# Patient Record
Sex: Female | Born: 1955 | Race: White | Hispanic: No | Smoking: Former smoker
Health system: Southern US, Community
[De-identification: ages and names within clinical notes are randomized; demographics above are authoritative.]

## PROBLEM LIST (undated history)

## (undated) DIAGNOSIS — G709 Myoneural disorder, unspecified: Secondary | ICD-10-CM

## (undated) DIAGNOSIS — F319 Bipolar disorder, unspecified: Secondary | ICD-10-CM

## (undated) DIAGNOSIS — R51 Headache: Secondary | ICD-10-CM

## (undated) DIAGNOSIS — E222 Syndrome of inappropriate secretion of antidiuretic hormone: Secondary | ICD-10-CM

## (undated) DIAGNOSIS — I639 Cerebral infarction, unspecified: Secondary | ICD-10-CM

## (undated) DIAGNOSIS — F329 Major depressive disorder, single episode, unspecified: Secondary | ICD-10-CM

## (undated) DIAGNOSIS — M1990 Unspecified osteoarthritis, unspecified site: Secondary | ICD-10-CM

## (undated) DIAGNOSIS — N189 Chronic kidney disease, unspecified: Secondary | ICD-10-CM

## (undated) DIAGNOSIS — F419 Anxiety disorder, unspecified: Secondary | ICD-10-CM

## (undated) DIAGNOSIS — Z87440 Personal history of urinary (tract) infections: Secondary | ICD-10-CM

## (undated) DIAGNOSIS — F29 Unspecified psychosis not due to a substance or known physiological condition: Secondary | ICD-10-CM

## (undated) DIAGNOSIS — K922 Gastrointestinal hemorrhage, unspecified: Secondary | ICD-10-CM

## (undated) DIAGNOSIS — G8929 Other chronic pain: Secondary | ICD-10-CM

## (undated) DIAGNOSIS — F32A Depression, unspecified: Secondary | ICD-10-CM

## (undated) DIAGNOSIS — E0590 Thyrotoxicosis, unspecified without thyrotoxic crisis or storm: Secondary | ICD-10-CM

## (undated) DIAGNOSIS — M25559 Pain in unspecified hip: Secondary | ICD-10-CM

---

## 2002-07-31 ENCOUNTER — Emergency Department (HOSPITAL_COMMUNITY): Admission: EM | Admit: 2002-07-31 | Discharge: 2002-07-31 | Payer: Self-pay | Admitting: Emergency Medicine

## 2002-08-08 ENCOUNTER — Emergency Department (HOSPITAL_COMMUNITY): Admission: EM | Admit: 2002-08-08 | Discharge: 2002-08-08 | Payer: Self-pay | Admitting: Emergency Medicine

## 2004-08-27 ENCOUNTER — Emergency Department (HOSPITAL_COMMUNITY): Admission: EM | Admit: 2004-08-27 | Discharge: 2004-08-27 | Payer: Self-pay | Admitting: Emergency Medicine

## 2006-04-03 ENCOUNTER — Inpatient Hospital Stay: Payer: Self-pay | Admitting: Psychiatry

## 2009-02-28 ENCOUNTER — Emergency Department (HOSPITAL_COMMUNITY): Admission: EM | Admit: 2009-02-28 | Discharge: 2009-02-28 | Payer: Self-pay | Admitting: Emergency Medicine

## 2009-06-12 ENCOUNTER — Emergency Department (HOSPITAL_COMMUNITY): Admission: EM | Admit: 2009-06-12 | Discharge: 2009-06-12 | Payer: Self-pay | Admitting: Emergency Medicine

## 2009-06-30 ENCOUNTER — Ambulatory Visit: Payer: Self-pay | Admitting: Psychiatry

## 2009-06-30 ENCOUNTER — Emergency Department (HOSPITAL_COMMUNITY): Admission: EM | Admit: 2009-06-30 | Discharge: 2009-06-30 | Payer: Self-pay | Admitting: Emergency Medicine

## 2009-07-19 ENCOUNTER — Inpatient Hospital Stay (HOSPITAL_COMMUNITY): Admission: AD | Admit: 2009-07-19 | Discharge: 2009-07-23 | Payer: Self-pay | Admitting: Psychiatry

## 2009-07-19 ENCOUNTER — Emergency Department (HOSPITAL_COMMUNITY): Admission: EM | Admit: 2009-07-19 | Discharge: 2009-07-19 | Payer: Self-pay | Admitting: Emergency Medicine

## 2009-12-21 ENCOUNTER — Inpatient Hospital Stay (HOSPITAL_COMMUNITY): Admission: AD | Admit: 2009-12-21 | Discharge: 2009-07-05 | Payer: Self-pay | Admitting: Psychiatry

## 2010-04-01 LAB — RAPID URINE DRUG SCREEN, HOSP PERFORMED
Amphetamines: NOT DETECTED
Amphetamines: NOT DETECTED
Barbiturates: NOT DETECTED
Benzodiazepines: POSITIVE — AB
Opiates: NOT DETECTED
Tetrahydrocannabinol: NOT DETECTED

## 2010-04-01 LAB — URINE CULTURE

## 2010-04-01 LAB — COMPREHENSIVE METABOLIC PANEL
ALT: 11 U/L (ref 0–35)
Albumin: 3.4 g/dL — ABNORMAL LOW (ref 3.5–5.2)
Alkaline Phosphatase: 58 U/L (ref 39–117)
BUN: 4 mg/dL — ABNORMAL LOW (ref 6–23)
Chloride: 100 mEq/L (ref 96–112)
Glucose, Bld: 86 mg/dL (ref 70–99)
Potassium: 4.6 mEq/L (ref 3.5–5.1)
Sodium: 133 mEq/L — ABNORMAL LOW (ref 135–145)
Total Bilirubin: 0.6 mg/dL (ref 0.3–1.2)
Total Protein: 5.9 g/dL — ABNORMAL LOW (ref 6.0–8.3)

## 2010-04-01 LAB — CBC
HCT: 39.8 % (ref 36.0–46.0)
Hemoglobin: 13.6 g/dL (ref 12.0–15.0)
MCHC: 34.3 g/dL (ref 30.0–36.0)
MCV: 90.7 fL (ref 78.0–100.0)
Platelets: 281 10*3/uL (ref 150–400)
RBC: 4.37 MIL/uL (ref 3.87–5.11)
RBC: 4.3 MIL/uL (ref 3.87–5.11)
RDW: 13.9 % (ref 11.5–15.5)
WBC: 11.5 10*3/uL — ABNORMAL HIGH (ref 4.0–10.5)

## 2010-04-01 LAB — BASIC METABOLIC PANEL
BUN: 5 mg/dL — ABNORMAL LOW (ref 6–23)
BUN: 2 mg/dL — ABNORMAL LOW (ref 6–23)
CO2: 25 mEq/L (ref 19–32)
CO2: 29 mEq/L (ref 19–32)
Calcium: 10.5 mg/dL (ref 8.4–10.5)
Calcium: 9.7 mg/dL (ref 8.4–10.5)
Chloride: 94 mEq/L — ABNORMAL LOW (ref 96–112)
Chloride: 94 mEq/L — ABNORMAL LOW (ref 96–112)
Creatinine, Ser: 0.65 mg/dL (ref 0.4–1.2)
GFR calc Af Amer: >60 mL/min (ref >60)
GFR calc non Af Amer: >60 mL/min (ref >60)
Glucose, Bld: 96 mg/dL (ref 70–99)
Potassium: 4.6 mEq/L (ref 3.5–5.1)
Potassium: 4.5 mEq/L (ref 3.5–5.1)
Sodium: 129 mEq/L — ABNORMAL LOW (ref 135–145)

## 2010-04-01 LAB — DIFFERENTIAL
Basophils Absolute: 0.1 10*3/uL (ref 0.0–0.1)
Basophils Relative: 1 % (ref 0–1)
Basophils Relative: 1 % (ref 0–1)
Eosinophils Absolute: 0.1 10*3/uL (ref 0.0–0.7)
Lymphocytes Relative: 31 % (ref 12–46)
Monocytes Absolute: 0.7 10*3/uL (ref 0.1–1.0)
Monocytes Relative: 6 % (ref 3–12)
Monocytes Relative: 6 % (ref 3–12)
Neutro Abs: 7.1 10*3/uL (ref 1.7–7.7)
Neutrophils Relative %: 61 % (ref 43–77)
Neutrophils Relative %: 64 % (ref 43–77)

## 2010-04-01 LAB — URINALYSIS, ROUTINE W REFLEX MICROSCOPIC
Hgb urine dipstick: NEGATIVE
Nitrite: NEGATIVE
Nitrite: POSITIVE — AB
Protein, ur: NEGATIVE mg/dL
Protein, ur: NEGATIVE mg/dL
Specific Gravity, Urine: 1.025 (ref 1.005–1.030)
Specific Gravity, Urine: >1.03 — ABNORMAL HIGH (ref 1.005–1.030)
Urobilinogen, UA: 0.2 mg/dL (ref 0.0–1.0)
Urobilinogen, UA: 0.2 mg/dL (ref 0.0–1.0)

## 2010-04-01 LAB — GLUCOSE, CAPILLARY: Glucose-Capillary: 137 mg/dL — ABNORMAL HIGH (ref 70–99)

## 2010-04-01 LAB — URINE MICROSCOPIC-ADD ON

## 2010-04-01 LAB — ETHANOL: Alcohol, Ethyl (B): <5 mg/dL (ref 0–10)

## 2010-04-01 LAB — VALPROIC ACID LEVEL: Valproic Acid Lvl: 62.9 ug/mL (ref 50.0–100.0)

## 2010-04-02 LAB — DIFFERENTIAL
Basophils Absolute: 0.2 10*3/uL — ABNORMAL HIGH (ref 0.0–0.1)
Eosinophils Absolute: 0.1 10*3/uL (ref 0.0–0.7)
Eosinophils Relative: 1 % (ref 0–5)

## 2010-04-02 LAB — RAPID URINE DRUG SCREEN, HOSP PERFORMED
Cocaine: NOT DETECTED
Opiates: NOT DETECTED
Tetrahydrocannabinol: NOT DETECTED

## 2010-04-02 LAB — CBC
HCT: 40.3 % (ref 36.0–46.0)
MCV: 90.4 fL (ref 78.0–100.0)
Platelets: 313 10*3/uL (ref 150–400)
RDW: 14.3 % (ref 11.5–15.5)

## 2010-04-02 LAB — VALPROIC ACID LEVEL: Valproic Acid Lvl: <10 ug/mL — ABNORMAL LOW (ref 50.0–100.0)

## 2010-04-02 LAB — BASIC METABOLIC PANEL
BUN: 3 mg/dL — ABNORMAL LOW (ref 6–23)
Chloride: 102 mEq/L (ref 96–112)
Glucose, Bld: 119 mg/dL — ABNORMAL HIGH (ref 70–99)
Potassium: 3.4 mEq/L — ABNORMAL LOW (ref 3.5–5.1)

## 2010-04-05 LAB — URINALYSIS, ROUTINE W REFLEX MICROSCOPIC
Glucose, UA: NEGATIVE mg/dL
Protein, ur: NEGATIVE mg/dL
Specific Gravity, Urine: 1.005 (ref 1.005–1.030)
Urobilinogen, UA: 0.2 mg/dL (ref 0.0–1.0)

## 2010-04-05 LAB — CBC
HCT: 42.7 % (ref 36.0–46.0)
MCV: 92.3 fL (ref 78.0–100.0)
RBC: 4.62 MIL/uL (ref 3.87–5.11)
WBC: 11.2 10*3/uL — ABNORMAL HIGH (ref 4.0–10.5)

## 2010-04-05 LAB — RAPID URINE DRUG SCREEN, HOSP PERFORMED
Barbiturates: NOT DETECTED
Benzodiazepines: NOT DETECTED
Opiates: NOT DETECTED

## 2010-04-05 LAB — DIFFERENTIAL
Eosinophils Absolute: 0.1 10*3/uL (ref 0.0–0.7)
Eosinophils Relative: 1 % (ref 0–5)
Lymphs Abs: 3.5 10*3/uL (ref 0.7–4.0)
Monocytes Relative: 7 % (ref 3–12)

## 2010-11-12 ENCOUNTER — Emergency Department (HOSPITAL_COMMUNITY)
Admission: EM | Admit: 2010-11-12 | Discharge: 2010-11-13 | Disposition: A | Discharge status: Other Acute Inpatient Hospital | Payer: Self-pay | Attending: Emergency Medicine | Admitting: Emergency Medicine

## 2010-11-12 ENCOUNTER — Encounter: Payer: Self-pay | Admitting: *Deleted

## 2010-11-12 DIAGNOSIS — F22 Delusional disorders: Secondary | ICD-10-CM | POA: Insufficient documentation

## 2010-11-12 DIAGNOSIS — F411 Generalized anxiety disorder: Secondary | ICD-10-CM | POA: Insufficient documentation

## 2010-11-12 DIAGNOSIS — F419 Anxiety disorder, unspecified: Secondary | ICD-10-CM

## 2010-11-12 DIAGNOSIS — Z008 Encounter for other general examination: Secondary | ICD-10-CM | POA: Insufficient documentation

## 2010-11-12 HISTORY — DX: Bipolar disorder, unspecified: F31.9

## 2010-11-12 LAB — DIFFERENTIAL
Basophils Absolute: 0 10*3/uL (ref 0.0–0.1)
Basophils Relative: 0 % (ref 0–1)
Eosinophils Absolute: 0.1 10*3/uL (ref 0.0–0.7)
Eosinophils Relative: 2 % (ref 0–5)
Lymphocytes Relative: 49 % — ABNORMAL HIGH (ref 12–46)
Lymphs Abs: 2.4 10*3/uL (ref 0.7–4.0)
Monocytes Absolute: 0.6 10*3/uL (ref 0.1–1.0)
Monocytes Relative: 12 % (ref 3–12)
Neutro Abs: 1.8 10*3/uL (ref 1.7–7.7)
Neutrophils Relative %: 37 % — ABNORMAL LOW (ref 43–77)

## 2010-11-12 LAB — CBC
HCT: 36.4 % (ref 36.0–46.0)
Hemoglobin: 12.7 g/dL (ref 12.0–15.0)
MCH: 31.7 pg (ref 26.0–34.0)
MCHC: 34.9 g/dL (ref 30.0–36.0)
MCV: 90.8 fL (ref 78.0–100.0)
Platelets: 167 10*3/uL (ref 150–400)
RBC: 4.01 MIL/uL (ref 3.87–5.11)
RDW: 13.4 % (ref 11.5–15.5)
WBC: 4.9 10*3/uL (ref 4.0–10.5)

## 2010-11-12 LAB — URINALYSIS, ROUTINE W REFLEX MICROSCOPIC
Bilirubin Urine: NEGATIVE
Glucose, UA: NEGATIVE mg/dL
Ketones, ur: NEGATIVE mg/dL
Nitrite: NEGATIVE
Protein, ur: NEGATIVE mg/dL
Specific Gravity, Urine: 1.01 (ref 1.005–1.030)
Urobilinogen, UA: 0.2 mg/dL (ref 0.0–1.0)
pH: 7 (ref 5.0–8.0)

## 2010-11-12 LAB — URINE MICROSCOPIC-ADD ON

## 2010-11-12 LAB — BASIC METABOLIC PANEL
BUN: 9 mg/dL (ref 6–23)
CO2: 34 mEq/L — ABNORMAL HIGH (ref 19–32)
Calcium: 10 mg/dL (ref 8.4–10.5)
Chloride: 91 mEq/L — ABNORMAL LOW (ref 96–112)
Creatinine, Ser: 0.54 mg/dL (ref 0.50–1.10)
GFR calc Af Amer: >90 mL/min (ref >90)
GFR calc non Af Amer: >90 mL/min (ref >90)
Glucose, Bld: 92 mg/dL (ref 70–99)
Potassium: 4.4 mEq/L (ref 3.5–5.1)
Sodium: 129 mEq/L — ABNORMAL LOW (ref 135–145)

## 2010-11-12 LAB — RAPID URINE DRUG SCREEN, HOSP PERFORMED
Amphetamines: NOT DETECTED
Barbiturates: NOT DETECTED
Benzodiazepines: NOT DETECTED
Cocaine: NOT DETECTED
Opiates: NOT DETECTED
Tetrahydrocannabinol: NOT DETECTED

## 2010-11-12 LAB — VALPROIC ACID LEVEL: Valproic Acid Lvl: 88.6 ug/mL (ref 50.0–100.0)

## 2010-11-12 LAB — PREGNANCY, URINE: Preg Test, Ur: NEGATIVE

## 2010-11-12 NOTE — ED Notes (Signed)
Pt stated she is "ready to go, I want to go home". MD aware

## 2010-11-12 NOTE — ED Notes (Signed)
Pt brought over by Child psychotherapist for medical clearance. Per family patient has been listless and very agitated over the last couple weeks. Per evaluation patient is having racing thoughts and illogical thinking. Pt alert and oriented x 3.

## 2010-11-12 NOTE — ED Provider Notes (Signed)
Scribed for Raeford Razor, MD, the patient was seen in room APA16A/APA16A . This chart was scribed by Ellie Lunch.   CSN: [id number] Arrival date & time: 11/12/2010  1:26 PM   First MD Initiated Contact with Patient 11/12/10 1508      Chief Complaint  Patient presents with  . Psychiatric Evaluation    (Consider location/radiation/quality/duration/timing/severity/associated sxs/prior treatment) HPI Level 5 caveat for psychiatric disorder.  Cassidy Smith is a 55 y.o. female who presents to the Emergency Department for medical clearance. Per family pat has been listless and very agitated over the last couple of weeks. Pt was brought into by a Child psychotherapist for medical clearance.   Per PT  Pt says she was brought in by brother b/c she is anxious. Pt lives with brother. Pt reports thought of self harm. She says she feels like "cutting." Pt is unsure of any event that occurred today that lead her to be brought in. Pt denies any drinking or drug use. Pt denis any HI/SI.   Past Medical History  Diagnosis Date  . Bipolar 1 disorder     History reviewed. No pertinent past surgical history.  History reviewed. No pertinent family history.  History  Substance Use Topics  . Smoking status: Current Everyday Smoker -- 1.0 packs/day  . Smokeless tobacco: Not on file  . Alcohol Use: No    Review of Systems  Unable to perform ROS: Psychiatric disorder    Allergies  Review of patient's allergies indicates no known allergies.  Home Medications   Current Outpatient Rx  Name Route Sig Dispense Refill  . DIVALPROEX SODIUM 500 MG PO TB24 Oral Take 500 mg by mouth 2 (two) times daily.      Marland Kitchen RISPERIDONE 2 MG PO TABS Oral Take 2 mg by mouth 2 (two) times daily.        BP 152/79  Pulse 77  Temp(Src) 97.5 F (36.4 C) (Oral)  Resp 20  Ht 5\' 6"  (1.676 m)  Wt 152 lb (68.947 kg)  BMI 24.53 kg/m2  SpO2 98%  Physical Exam  Nursing note and vitals reviewed. Constitutional: She is  oriented to person, place, and time. She appears well-developed and well-nourished. No distress.  HENT:  Head: Normocephalic and atraumatic.  Eyes: EOM are normal. Pupils are equal, round, and reactive to light.  Neck: Normal range of motion. Neck supple.  Cardiovascular: Normal rate, regular rhythm and normal heart sounds.   Pulmonary/Chest: Effort normal and breath sounds normal.  Abdominal: Soft. There is no tenderness.  Musculoskeletal: Normal range of motion.  Neurological: She is alert and oriented to person, place, and time.  Skin: Skin is warm and dry.       Well healed linear wounds on dorsal aspect of left wrist.   Psychiatric:       Flight of ideas Very tangential though process. Illogical thoughts. Paranoid. Denies SI/HI.     ED Course  Procedures (including critical care time)  OTHER DATA REVIEWED: Nursing notes, vital signs, and past medical records reviewed.   DIAGNOSTIC STUDIES: Oxygen Saturation is 98% on room air, normal by my interpretation.    Results for orders placed during the hospital encounter of 11/12/10  CBC      Component Value Range   WBC 4.9  4.0 - 10.5 (K/uL)   RBC 4.01  3.87 - 5.11 (MIL/uL)   Hemoglobin 12.7  12.0 - 15.0 (g/dL)   HCT 40.9  81.1 - 91.4 (%)   MCV 90.8  78.0 - 100.0 (fL)   MCH 31.7  26.0 - 34.0 (pg)   MCHC 34.9  30.0 - 36.0 (g/dL)   RDW 78.4  69.6 - 29.5 (%)   Platelets 167  150 - 400 (K/uL)  DIFFERENTIAL      Component Value Range   Neutrophils Relative 37 (*) 43 - 77 (%)   Neutro Abs 1.8  1.7 - 7.7 (K/uL)   Lymphocytes Relative 49 (*) 12 - 46 (%)   Lymphs Abs 2.4  0.7 - 4.0 (K/uL)   Monocytes Relative 12  3 - 12 (%)   Monocytes Absolute 0.6  0.1 - 1.0 (K/uL)   Eosinophils Relative 2  0 - 5 (%)   Eosinophils Absolute 0.1  0.0 - 0.7 (K/uL)   Basophils Relative 0  0 - 1 (%)   Basophils Absolute 0.0  0.0 - 0.1 (K/uL)  BASIC METABOLIC PANEL      Component Value Range   Sodium 129 (*) 135 - 145 (mEq/L)   Potassium 4.4   3.5 - 5.1 (mEq/L)   Chloride 91 (*) 96 - 112 (mEq/L)   CO2 34 (*) 19 - 32 (mEq/L)   Glucose, Bld 92  70 - 99 (mg/dL)   BUN 9  6 - 23 (mg/dL)   Creatinine, Ser 2.84  0.50 - 1.10 (mg/dL)   Calcium 13.2  8.4 - 10.5 (mg/dL)   GFR calc non Af Amer >90  >90 (mL/min)   GFR calc Af Amer >90  >90 (mL/min)  URINALYSIS, ROUTINE W REFLEX MICROSCOPIC      Component Value Range   Color, Urine YELLOW  YELLOW    Appearance CLEAR  CLEAR    Specific Gravity, Urine 1.010  1.005 - 1.030    pH 7.0  5.0 - 8.0    Glucose, UA NEGATIVE  NEGATIVE (mg/dL)   Hgb urine dipstick TRACE (*) NEGATIVE    Bilirubin Urine NEGATIVE  NEGATIVE    Ketones, ur NEGATIVE  NEGATIVE (mg/dL)   Protein, ur NEGATIVE  NEGATIVE (mg/dL)   Urobilinogen, UA 0.2  0.0 - 1.0 (mg/dL)   Nitrite NEGATIVE  NEGATIVE    Leukocytes, UA SMALL (*) NEGATIVE   URINE RAPID DRUG SCREEN (HOSP PERFORMED)      Component Value Range   Opiates NONE DETECTED  NONE DETECTED    Cocaine NONE DETECTED  NONE DETECTED    Benzodiazepines NONE DETECTED  NONE DETECTED    Amphetamines NONE DETECTED  NONE DETECTED    Tetrahydrocannabinol NONE DETECTED  NONE DETECTED    Barbiturates NONE DETECTED  NONE DETECTED   PREGNANCY, URINE      Component Value Range   Preg Test, Ur NEGATIVE    URINE MICROSCOPIC-ADD ON      Component Value Range   Squamous Epithelial / LPF FEW (*) RARE    WBC, UA 7-10  <3 (WBC/hpf)   Bacteria, UA FEW (*) RARE     No diagnosis found.  15:35 EDP consult with tommy from ACT team to obtain more history.  16:05 EDP consult with ACT team  MDM  55yF with paranoid thoughts and anxiety. No SI or HI. Responds to internal stimuli and having visual hallucinations.  Unclear if this is patient's baseline. Psychiatric consult recommending inpt treatment which I am in agreement in. Pt reports noncompliance with risperdal which is being restarted at home dosing. Hyponatremia noted on on prior labs with last values 132 and 129. Doubt related to  current symptoms. Pt voluntary at this point. If  pt would change mind and refuse further care or treatment would only be fine with pt leaving if brother would be willing to assume care for.  I personally preformed the services scribed in my presence. The recorded information has been reviewed and considered. Raeford Razor, MD.        Raeford Razor, MD 11/13/10 [phone]

## 2010-11-12 NOTE — ED Notes (Signed)
Tele psyche at bedside. Sitter at bedside. Pt in stable and calm condition. No requests at this time.

## 2010-11-13 MED ORDER — RISPERIDONE 1 MG PO TABS
2.0000 mg | ORAL_TABLET | Freq: Every day | ORAL | Status: DC
  Filled 2010-11-13: qty 1

## 2010-11-13 MED ORDER — LORAZEPAM 1 MG PO TABS: 1.0000 mg | ORAL_TABLET | Freq: Three times a day (TID) | ORAL | Status: DC | PRN

## 2010-11-13 MED ORDER — ZOLPIDEM TARTRATE 5 MG PO TABS: 5.0000 mg | ORAL_TABLET | Freq: Every evening | ORAL | Status: DC | PRN

## 2010-11-13 MED ORDER — ONDANSETRON HCL 4 MG PO TABS: 4.0000 mg | ORAL_TABLET | ORAL | Status: DC | PRN

## 2010-11-13 MED ORDER — ACETAMINOPHEN 325 MG PO TABS: 650.0000 mg | ORAL_TABLET | Freq: Three times a day (TID) | ORAL | Status: DC | PRN

## 2010-11-13 MED ORDER — ZOLPIDEM TARTRATE 5 MG PO TABS
5.0000 mg | ORAL_TABLET | Freq: Once | ORAL | Status: AC
  Administered 2010-11-13: 5 mg via ORAL
  Filled 2010-11-13: qty 1

## 2010-11-13 NOTE — ED Notes (Signed)
Belongings returned to patient. Patient left ED in police custody. NAD noted upon departure.

## 2010-11-13 NOTE — ED Provider Notes (Signed)
Care obtained from Dr Juleen China.  The pt has remained stable in the ER and continues to await psychiatric disposition at this time.     Lyanne Co, MD 11/13/10 [phone]

## 2010-11-13 NOTE — ED Notes (Signed)
Patient provided hot breakfast tray. Denies any other needs at this time.

## 2010-11-13 NOTE — ED Provider Notes (Signed)
Pt accepted at [facility] by Dr Robbie Louis.  Ward Givens, MD 11/13/10 [phone]

## 2010-11-13 NOTE — ED Notes (Signed)
[facility] called to transport patient to [facility] [place].

## 2010-11-13 NOTE — ED Provider Notes (Signed)
Pt is sitting on stretcher eating breakfast. States "I feel hopeless". States she is here because her brother called her therapist and agrees she was getting  agitated and getting aggressive. States she has been diagnosed as bipolar and schizophrenic in the past. Pt states she [place].  Pt has had telepsych evalutation and is waiting placement.   Ward Givens, MD 11/13/10 [phone]

## 2010-11-13 NOTE — ED Notes (Signed)
Lunch tray provided to patient at this time.

## 2012-07-16 ENCOUNTER — Emergency Department (HOSPITAL_COMMUNITY)
Admission: EM | Admit: 2012-07-16 | Discharge: 2012-07-16 | Disposition: A | Discharge status: Home / Self Care | Payer: Medicaid Other | Attending: Emergency Medicine | Admitting: Emergency Medicine

## 2012-07-16 ENCOUNTER — Encounter (HOSPITAL_COMMUNITY): Payer: Self-pay

## 2012-07-16 DIAGNOSIS — Z79899 Other long term (current) drug therapy: Secondary | ICD-10-CM | POA: Insufficient documentation

## 2012-07-16 DIAGNOSIS — R259 Unspecified abnormal involuntary movements: Secondary | ICD-10-CM | POA: Insufficient documentation

## 2012-07-16 DIAGNOSIS — F319 Bipolar disorder, unspecified: Secondary | ICD-10-CM | POA: Insufficient documentation

## 2012-07-16 DIAGNOSIS — F200 Paranoid schizophrenia: Secondary | ICD-10-CM | POA: Insufficient documentation

## 2012-07-16 DIAGNOSIS — F17200 Nicotine dependence, unspecified, uncomplicated: Secondary | ICD-10-CM | POA: Insufficient documentation

## 2012-07-16 DIAGNOSIS — E871 Hypo-osmolality and hyponatremia: Secondary | ICD-10-CM | POA: Insufficient documentation

## 2012-07-16 LAB — CBC WITH DIFFERENTIAL/PLATELET
Basophils Absolute: 0 10*3/uL (ref 0.0–0.1)
Basophils Relative: 0 % (ref 0–1)
Eosinophils Absolute: 0.1 10*3/uL (ref 0.0–0.7)
Eosinophils Relative: 2 % (ref 0–5)
Lymphocytes Relative: 44 % (ref 12–46)
MCH: 31.9 pg (ref 26.0–34.0)
MCV: 90.6 fL (ref 78.0–100.0)
Platelets: 140 10*3/uL — ABNORMAL LOW (ref 150–400)
RDW: 14 % (ref 11.5–15.5)
WBC: 5.6 10*3/uL (ref 4.0–10.5)

## 2012-07-16 LAB — COMPREHENSIVE METABOLIC PANEL
ALT: 9 U/L (ref 0–35)
AST: 13 U/L (ref 0–37)
Albumin: 3.9 g/dL (ref 3.5–5.2)
Calcium: 10.4 mg/dL (ref 8.4–10.5)
Sodium: 130 mEq/L — ABNORMAL LOW (ref 135–145)
Total Protein: 7.2 g/dL (ref 6.0–8.3)

## 2012-07-16 NOTE — ED Notes (Signed)
Pt states she was at the health department yesterday. States she was called 20 times today and told to come her for further evaluation

## 2012-07-16 NOTE — ED Provider Notes (Signed)
History    CSN: [id number] Arrival date & time 07/16/12  1756  First MD Initiated Contact with Patient 07/16/12 1838     Chief Complaint  Patient presents with  . Abnormal Lab   (Consider location/radiation/quality/duration/timing/severity/associated sxs/prior Treatment) HPI This 57 year old female who was at the health Department for routine physical examination this week and was called today to come to the ED for a lab check because she had a sodium level of 130. The patient herself has long-standing tremors in both hands for years which are stable. She has no complaints herself and feels asymptomatic. She has a history of paranoid schizophrenia but is not a threat to herself or others in her movements are stable. She is no anxiety depression or threats or himself or others. She does not feel suicidal homicidal and she is not hallucinating. She is no headache neck pain back pain chest pain shortness breath abdominal pain vomiting diarrhea bloody stools or other concerns. She did notice a little blood in the urine this week without dysuria and was placed on Bactrim by the health department. She has not started taking the Bactrim yet. Past Medical History  Diagnosis Date  . Bipolar 1 disorder    paranoid schizophrenia History reviewed. No pertinent past surgical history. No family history on file. History  Substance Use Topics  . Smoking status: Current Every Day Smoker -- 4.00 packs/day  . Smokeless tobacco: Not on file  . Alcohol Use: No   OB History   Grav Para Term Preterm Abortions TAB SAB Ect Mult Living                 Review of Systems 10 Systems reviewed and are negative for acute change except as noted in the HPI. Allergies  Review of patient's allergies indicates no known allergies.  Home Medications   Current Outpatient Rx  Name  Route  Sig  Dispense  Refill  . oxybutynin (DITROPAN) 5 MG tablet   Oral   Take 2.5 mg by mouth 2 (two) times daily.         .  risperidone (RISPERDAL) 4 MG tablet   Oral   Take 8 mg by mouth at bedtime.         . sulfamethoxazole-trimethoprim (BACTRIM DS) 800-160 MG per tablet   Oral   Take 1 tablet by mouth 2 (two) times daily. For 3 days (start on 07/16/2012)         . traZODone (DESYREL) 50 MG tablet   Oral   Take 50 mg by mouth at bedtime.         . divalproex (DEPAKOTE ER) 500 MG 24 hr tablet   Oral   Take 1,500 mg by mouth at bedtime.           BP 138/73  Pulse 88  Temp(Src) 98.1 F (36.7 C) (Oral)  Resp 18  Ht 5' 4.5" (1.638 m)  Wt 117 lb (53.071 kg)  BMI 19.78 kg/m2  SpO2 98% Physical Exam  Nursing note and vitals reviewed. Constitutional:  Awake, alert, nontoxic appearance.  HENT:  Head: Atraumatic.  Eyes: Right eye exhibits no discharge. Left eye exhibits no discharge.  Neck: Neck supple.  Cardiovascular: Normal rate and regular rhythm.   No murmur heard. Pulmonary/Chest: Effort normal and breath sounds normal. No respiratory distress. She has no wheezes. She has no rales. She exhibits no tenderness.  Abdominal: Soft. Bowel sounds are normal. She exhibits no distension. There is no tenderness. There is no  rebound and no guarding.  Musculoskeletal: She exhibits no edema and no tenderness.  Baseline ROM, no obvious new focal weakness.  Neurological: She is alert.  Mental status and motor strength appears baseline for patient and situation.  Skin: No rash noted.  Psychiatric: She has a normal mood and affect.    ED Course  Procedures (including critical care time) Patient understand and agree with initial ED impression and plan with expectations set for ED visit. Pt with mild hyponatremia for years per records. Labs Reviewed  CBC WITH DIFFERENTIAL - Abnormal; Notable for the following:    Platelets 140 (*)    Neutrophils Relative % 42 (*)    All other components within normal limits  COMPREHENSIVE METABOLIC PANEL - Abnormal; Notable for the following:    Sodium 130 (*)     Chloride 92 (*)    CO2 33 (*)    Total Bilirubin 0.2 (*)    All other components within normal limits  VALPROIC ACID LEVEL - Abnormal; Notable for the following:    Valproic Acid Lvl 17.4 (*)    All other components within normal limits   No results found. 1. Hyponatremia     MDM  I doubt any other EMC precluding discharge at this time including, but not necessarily limited to the following:symptomatic hyponatremia.  Hurman Horn, MD 07/17/12 [phone]

## 2012-07-16 NOTE — ED Notes (Signed)
Pt states she had her blood drawn by health department yesterday and has been told to seek evaluation in ER due to results. Pt is unsure what test was abnormal.

## 2012-08-11 ENCOUNTER — Telehealth: Payer: Self-pay

## 2012-08-11 NOTE — Telephone Encounter (Signed)
L/M to call.

## 2012-08-21 ENCOUNTER — Encounter (HOSPITAL_COMMUNITY): Payer: Self-pay | Admitting: Pharmacy Technician

## 2012-08-26 ENCOUNTER — Encounter (HOSPITAL_COMMUNITY): Payer: Self-pay

## 2012-08-26 ENCOUNTER — Encounter (HOSPITAL_COMMUNITY)
Admission: RE | Admit: 2012-08-26 | Discharge: 2012-08-26 | Disposition: A | Discharge status: Home / Self Care | Payer: Medicaid Other | Source: Ambulatory Visit | Attending: Oral Surgery | Admitting: Oral Surgery

## 2012-08-26 DIAGNOSIS — Z01818 Encounter for other preprocedural examination: Secondary | ICD-10-CM | POA: Insufficient documentation

## 2012-08-26 DIAGNOSIS — Z01812 Encounter for preprocedural laboratory examination: Secondary | ICD-10-CM | POA: Insufficient documentation

## 2012-08-26 HISTORY — DX: Anxiety disorder, unspecified: F41.9

## 2012-08-26 HISTORY — DX: Chronic kidney disease, unspecified: N18.9

## 2012-08-26 HISTORY — DX: Depression, unspecified: F32.A

## 2012-08-26 HISTORY — DX: Major depressive disorder, single episode, unspecified: F32.9

## 2012-08-26 LAB — CBC
Hemoglobin: 14.2 g/dL (ref 12.0–15.0)
MCH: 32.6 pg (ref 26.0–34.0)
RBC: 4.35 MIL/uL (ref 3.87–5.11)
WBC: 7.6 10*3/uL (ref 4.0–10.5)

## 2012-08-26 LAB — BASIC METABOLIC PANEL
CO2: 29 mEq/L (ref 19–32)
GFR calc non Af Amer: >90 mL/min (ref >90)
Glucose, Bld: 92 mg/dL (ref 70–99)
Potassium: 4.4 mEq/L (ref 3.5–5.1)
Sodium: 131 mEq/L — ABNORMAL LOW (ref 135–145)

## 2012-08-26 LAB — URINALYSIS, ROUTINE W REFLEX MICROSCOPIC
Glucose, UA: NEGATIVE mg/dL
Hgb urine dipstick: NEGATIVE
Specific Gravity, Urine: 1.012 (ref 1.005–1.030)
Urobilinogen, UA: 1 mg/dL (ref 0.0–1.0)

## 2012-08-26 LAB — URINE MICROSCOPIC-ADD ON

## 2012-08-26 NOTE — Progress Notes (Signed)
Pt c/o of possible uti currently. Will check u/a  With pat

## 2012-08-26 NOTE — Pre-Procedure Instructions (Signed)
Cassidy Smith  08/26/2012   Your procedure is scheduled on:  08/31/12  Report to [facility] at 1115 AM.  Call this number if you have problems the morning of surgery: [phone]   Remember:   Do not eat food or drink liquids after midnight.   Take these medicines the morning of surgery with A SIP OF WATER: depakote,ditropan   Do not wear jewelry, make-up or nail polish.  Do not wear lotions, powders, or perfumes. You may wear deodorant.  Do not shave 48 hours prior to surgery. Men may shave face and neck.  Do not bring valuables to the hospital.  [facility] is not responsible                   for any belongings or valuables.  Contacts, dentures or bridgework may not be worn into surgery.  Leave suitcase in the car. After surgery it may be brought to your room.  For patients admitted to the hospital, checkout time is 11:00 AM the day of  discharge.   Patients discharged the day of surgery will not be allowed to drive  home.  Name and phone number of your driver: family  Special Instructions: Shower using CHG 2 nights before surgery and the night before surgery.  If you shower the day of surgery use CHG.  Use special wash - you have one bottle of CHG for all showers.  You should use approximately 1/3 of the bottle for each shower.   Please read over the following fact sheets that you were given: Pain Booklet, Coughing and Deep Breathing and Surgical Site Infection Prevention

## 2012-08-27 NOTE — H&P (Signed)
HISTORY AND PHYSICAL  Cassidy Smith is a 57 y.o. female patient with CC: painful teeth  No diagnosis found.  Past Medical History  Diagnosis Date  . Bipolar 1 disorder   . Chronic kidney disease     uti  currently,  hx bladder spasms  . Anxiety   . Depression     No current facility-administered medications for this encounter.   Current Outpatient Prescriptions  Medication Sig Dispense Refill  . divalproex (DEPAKOTE ER) 500 MG 24 hr tablet Take 1,500 mg by mouth at bedtime.       Marland Kitchen oxybutynin (DITROPAN) 5 MG tablet Take 2.5 mg by mouth 2 (two) times daily.      . risperidone (RISPERDAL) 4 MG tablet Take 8 mg by mouth at bedtime.      . traZODone (DESYREL) 50 MG tablet Take 25 mg by mouth at bedtime.        No Known Allergies Active Problems:   * No active hospital problems. *  Vitals: There were no vitals taken for this visit. Lab results: Results for orders placed during the hospital encounter of 08/26/12 (from the past 24 hour(s))  URINALYSIS, ROUTINE W REFLEX MICROSCOPIC     Status: Abnormal   Collection Time    08/26/12  2:23 PM      Result Value Range   Color, Urine YELLOW  YELLOW   APPearance HAZY (*) CLEAR   Specific Gravity, Urine 1.012  1.005 - 1.030   pH 7.0  5.0 - 8.0   Glucose, UA NEGATIVE  NEGATIVE mg/dL   Hgb urine dipstick NEGATIVE  NEGATIVE   Bilirubin Urine NEGATIVE  NEGATIVE   Ketones, ur NEGATIVE  NEGATIVE mg/dL   Protein, ur NEGATIVE  NEGATIVE mg/dL   Urobilinogen, UA 1.0  0.0 - 1.0 mg/dL   Nitrite NEGATIVE  NEGATIVE   Leukocytes, UA LARGE (*) NEGATIVE  URINE MICROSCOPIC-ADD ON     Status: Abnormal   Collection Time    08/26/12  2:23 PM      Result Value Range   Squamous Epithelial / LPF FEW (*) RARE   WBC, UA 21-50  <3 WBC/hpf   Bacteria, UA MANY (*) RARE  BASIC METABOLIC PANEL     Status: Abnormal   Collection Time    08/26/12  2:36 PM      Result Value Range   Sodium 131 (*) 135 - 145 mEq/L   Potassium 4.4  3.5 - 5.1 mEq/L   Chloride 95 (*) 96 - 112 mEq/L   CO2 29  19 - 32 mEq/L   Glucose, Bld 92  70 - 99 mg/dL   BUN 10  6 - 23 mg/dL   Creatinine, Ser 4.09 (*) 0.50 - 1.10 mg/dL   Calcium 81.1  8.4 - 91.4 mg/dL   GFR calc non Af Amer >90  >90 mL/min   GFR calc Af Amer >90  >90 mL/min  CBC     Status: Abnormal   Collection Time    08/26/12  2:36 PM      Result Value Range   WBC 7.6  4.0 - 10.5 K/uL   RBC 4.35  3.87 - 5.11 MIL/uL   Hemoglobin 14.2  12.0 - 15.0 g/dL   HCT 78.2  95.6 - 21.3 %   MCV 89.4  78.0 - 100.0 fL   MCH 32.6  26.0 - 34.0 pg   MCHC 36.5 (*) 30.0 - 36.0 g/dL   RDW 08.6  57.8 - 46.9 %  Platelets 209  150 - 400 K/uL   Radiology Results: No results found. General appearance: alert, cooperative and no distress Head: Normocephalic, without obvious abnormality, atraumatic Eyes: negative Ears: normal TM's and external ear canals both ears Nose: Nares normal. Septum midline. Mucosa normal. No drainage or sinus tenderness. Throat: Dental caries and bone loss teeth #'s 5, 6, 8, 9, 10, 18, 19, 31 Neck: no adenopathy, supple, symmetrical, trachea midline and thyroid not enlarged, symmetric, no tenderness/mass/nodules Resp: clear to auscultation bilaterally Cardio: regular rate and rhythm, S1, S2 normal, no murmur, click, rub or gallop  Assessment: 57 WF with non-restorable teeth #'s 5, 6, 8, 9, 10, 18, 19, 31.   Plan: Dental extractions, alveoloplasty with General anesthesia. Day surgery.   Georgia Lopes 08/27/2012

## 2012-08-28 LAB — URINE CULTURE: Colony Count: >=100000

## 2012-08-30 MED ORDER — CEFAZOLIN SODIUM-DEXTROSE 2-3 GM-% IV SOLR: 2.0000 g | INTRAVENOUS | Status: AC

## 2012-08-31 ENCOUNTER — Ambulatory Visit (HOSPITAL_COMMUNITY): Admission: RE | Admit: 2012-08-31 | Payer: Medicaid Other | Source: Ambulatory Visit | Admitting: Oral Surgery

## 2012-08-31 ENCOUNTER — Encounter (HOSPITAL_COMMUNITY): Admission: RE | Payer: Self-pay | Source: Ambulatory Visit

## 2012-08-31 SURGERY — MULTIPLE EXTRACTION WITH ALVEOLOPLASTY
Anesthesia: Choice | Site: Mouth

## 2012-09-10 NOTE — Telephone Encounter (Signed)
Pt was referred by Renelda Loma, NP for screening colonoscopy.

## 2012-09-10 NOTE — Telephone Encounter (Signed)
Called pt. She is having a lot of diarrhea but does not want to schedule OV yet ( even though I told her it would be several weeks out). Said she is having dental work done and she will call when that is complete. Letter sent to PCP.

## 2012-10-09 ENCOUNTER — Encounter (HOSPITAL_COMMUNITY): Payer: Self-pay | Admitting: Pharmacy Technician

## 2012-10-14 ENCOUNTER — Telehealth: Payer: Self-pay | Admitting: Gastroenterology

## 2012-10-14 ENCOUNTER — Ambulatory Visit: Payer: Medicaid Other | Admitting: Gastroenterology

## 2012-10-14 NOTE — Telephone Encounter (Signed)
Pt was a no show

## 2012-10-14 NOTE — Telephone Encounter (Signed)
Please send letter to call when she is ready to proceed.

## 2012-10-15 NOTE — Telephone Encounter (Signed)
Mailed letter °

## 2012-10-16 ENCOUNTER — Encounter: Payer: Self-pay | Admitting: Gastroenterology

## 2012-10-21 NOTE — Pre-Procedure Instructions (Signed)
DAPHANE ODEKIRK  10/21/2012   Your procedure is scheduled on:  Friday October 30, 2012.   Report to [facility] Stay [facility]tr[facility] "A"  Admitting at 8:45 AM.  Call this number if you have problems the morning of surgery: [phone]   Remember:   Do not eat food or drink liquids after midnight.   Take these medicines the morning of surgery with A SIP OF WATER: Oxybutynin (Ditropan)   Do not wear jewelry, make-up or nail polish.  Do not wear lotions, powders, or perfumes. You may wear deodorant.  Do not shave 48 hours prior to surgery.   Do not bring valuables to the hospital.  [facility] is not responsible for any belongings or valuables.               Contacts, dentures or bridgework may not be worn into surgery.  Leave suitcase in the car. After surgery it may be brought to your room.  For patients admitted to the hospital, discharge time is determined by your treatment team.               Patients discharged the day of surgery will not be allowed to drive home.  Name and phone number of your driver: Family/Friend  Special Instructions: Shower using CHG 2 nights before surgery and the night before surgery.  If you shower the day of surgery use CHG.  Use special wash - you have one bottle of CHG for all showers.  You should use approximately 1/3 of the bottle for each shower.   Please read over the following fact sheets that you were given: Pain Booklet, Coughing and Deep Breathing and Surgical Site Infection Prevention

## 2012-10-22 ENCOUNTER — Encounter (HOSPITAL_COMMUNITY)
Admission: RE | Admit: 2012-10-22 | Discharge: 2012-10-22 | Disposition: A | Discharge status: Home / Self Care | Payer: Medicaid Other | Source: Ambulatory Visit | Attending: Oral Surgery | Admitting: Oral Surgery

## 2012-10-22 ENCOUNTER — Encounter (HOSPITAL_COMMUNITY): Payer: Self-pay

## 2012-10-22 DIAGNOSIS — Z01812 Encounter for preprocedural laboratory examination: Secondary | ICD-10-CM | POA: Insufficient documentation

## 2012-10-22 HISTORY — DX: Myoneural disorder, unspecified: G70.9

## 2012-10-22 HISTORY — DX: Headache: R51

## 2012-10-22 LAB — CBC
HCT: 40.2 % (ref 36.0–46.0)
Hemoglobin: 14.5 g/dL (ref 12.0–15.0)
MCH: 32.4 pg (ref 26.0–34.0)
MCHC: 36.1 g/dL — ABNORMAL HIGH (ref 30.0–36.0)
Platelets: 121 10*3/uL — ABNORMAL LOW (ref 150–400)
WBC: 6.5 10*3/uL (ref 4.0–10.5)

## 2012-10-22 LAB — BASIC METABOLIC PANEL
BUN: 11 mg/dL (ref 6–23)
Chloride: 93 mEq/L — ABNORMAL LOW (ref 96–112)
GFR calc non Af Amer: >90 mL/min (ref >90)
Glucose, Bld: 102 mg/dL — ABNORMAL HIGH (ref 70–99)
Potassium: 4.7 mEq/L (ref 3.5–5.1)

## 2012-10-22 NOTE — Progress Notes (Signed)
Denies having a PCP or Cardiologist.  Denies having a recent CXR or EKG. Denies having a stress test, echo, or card cath.

## 2012-10-22 NOTE — H&P (Signed)
HISTORY AND PHYSICAL  Cassidy Smith is a 57 y.o. female patient with CC: Painful teeth.  No diagnosis found.  Past Medical History  Diagnosis Date  . Bipolar 1 disorder   . Chronic kidney disease     uti  currently,  hx bladder spasms  . Anxiety   . Depression     No current facility-administered medications for this encounter.   Current Outpatient Prescriptions  Medication Sig Dispense Refill  . Bisacodyl (LAXATIVE PO) Take 30 mLs by mouth daily as needed (constipation).      Marland Kitchen divalproex (DEPAKOTE ER) 500 MG 24 hr tablet Take 1,500 mg by mouth at bedtime.       Marland Kitchen oxybutynin (DITROPAN) 5 MG tablet Take 2.5 mg by mouth 2 (two) times daily.      . risperidone (RISPERDAL) 4 MG tablet Take 4 mg by mouth at bedtime.       . traZODone (DESYREL) 50 MG tablet Take 50 mg by mouth at bedtime.        No Known Allergies Active Problems:   * No active hospital problems. *  Vitals: There were no vitals taken for this visit. Lab results:No results found for this or any previous visit (from the past 24 hour(s)). Radiology Results: No results found. General appearance: alert and cooperative Head: Normocephalic, without obvious abnormality, atraumatic Eyes: negative Nose: Nares normal. Septum midline. Mucosa normal. No drainage or sinus tenderness. Throat: dental caries teeth #'s 5, 6, 8, 9, 10, 18, 19, 31, hyperplastic maxillary left  osseous tuberosity Neck: no adenopathy, supple, symmetrical, trachea midline and thyroid not enlarged, symmetric, no tenderness/mass/nodules Resp: clear to auscultation bilaterally Cardio: regular rate and rhythm, S1, S2 normal, no murmur, click, rub or gallop  Assessment: 57 WF Bipolar, anxiety, depression with teeth #'s 5, 6, 8, 9, 10, 18, 19, 31, hyperplastic maxillary left  osseous tuberosity.  Plan: extraction teeth #'s 5, 6, 8, 9, 10, 18, 19, 31, alveoloplasty, reduction hyperplastic maxillary left  osseous tuberosity. General anesthesia. Day  surgery.   Kyng Matlock M 10/22/2012

## 2012-10-29 MED ORDER — CEFAZOLIN SODIUM-DEXTROSE 2-3 GM-% IV SOLR
2.0000 g | INTRAVENOUS | Status: AC
  Administered 2012-10-30: 2 g via INTRAVENOUS
  Filled 2012-10-29: qty 50

## 2012-10-29 NOTE — Progress Notes (Signed)
Patient instructed to arrive at 530 am on 10/30/12.

## 2012-10-30 ENCOUNTER — Encounter (HOSPITAL_COMMUNITY)
Admission: RE | Disposition: A | Discharge status: Home / Self Care | Payer: Self-pay | Source: Ambulatory Visit | Attending: Oral Surgery

## 2012-10-30 ENCOUNTER — Encounter (HOSPITAL_COMMUNITY): Payer: Self-pay | Admitting: *Deleted

## 2012-10-30 ENCOUNTER — Ambulatory Visit (HOSPITAL_COMMUNITY)
Admission: RE | Admit: 2012-10-30 | Discharge: 2012-10-30 | Disposition: A | Discharge status: Home / Self Care | Payer: Medicaid Other | Source: Ambulatory Visit | Attending: Oral Surgery | Admitting: Oral Surgery

## 2012-10-30 ENCOUNTER — Ambulatory Visit (HOSPITAL_COMMUNITY): Payer: Medicaid Other | Admitting: Anesthesiology

## 2012-10-30 ENCOUNTER — Encounter (HOSPITAL_COMMUNITY): Payer: Medicaid Other | Admitting: Anesthesiology

## 2012-10-30 DIAGNOSIS — F319 Bipolar disorder, unspecified: Secondary | ICD-10-CM | POA: Insufficient documentation

## 2012-10-30 DIAGNOSIS — N189 Chronic kidney disease, unspecified: Secondary | ICD-10-CM | POA: Insufficient documentation

## 2012-10-30 DIAGNOSIS — F411 Generalized anxiety disorder: Secondary | ICD-10-CM | POA: Insufficient documentation

## 2012-10-30 DIAGNOSIS — M269 Dentofacial anomaly, unspecified: Secondary | ICD-10-CM | POA: Insufficient documentation

## 2012-10-30 DIAGNOSIS — F17200 Nicotine dependence, unspecified, uncomplicated: Secondary | ICD-10-CM | POA: Insufficient documentation

## 2012-10-30 DIAGNOSIS — N390 Urinary tract infection, site not specified: Secondary | ICD-10-CM | POA: Insufficient documentation

## 2012-10-30 DIAGNOSIS — K029 Dental caries, unspecified: Secondary | ICD-10-CM

## 2012-10-30 DIAGNOSIS — K219 Gastro-esophageal reflux disease without esophagitis: Secondary | ICD-10-CM | POA: Insufficient documentation

## 2012-10-30 DIAGNOSIS — K056 Periodontal disease, unspecified: Secondary | ICD-10-CM

## 2012-10-30 DIAGNOSIS — K0889 Other specified disorders of teeth and supporting structures: Secondary | ICD-10-CM | POA: Insufficient documentation

## 2012-10-30 HISTORY — PX: MULTIPLE EXTRACTIONS WITH ALVEOLOPLASTY: SHX5342

## 2012-10-30 SURGERY — MULTIPLE EXTRACTION WITH ALVEOLOPLASTY
Anesthesia: General | Site: Mouth | Wound class: Clean Contaminated

## 2012-10-30 MED ORDER — PROPOFOL 10 MG/ML IV BOLUS
INTRAVENOUS | Status: DC | PRN
  Administered 2012-10-30: 140 mg via INTRAVENOUS

## 2012-10-30 MED ORDER — EPHEDRINE SULFATE 50 MG/ML IJ SOLN
INTRAMUSCULAR | Status: DC | PRN
  Administered 2012-10-30: 10 mg via INTRAVENOUS

## 2012-10-30 MED ORDER — SODIUM CHLORIDE 0.9 % IR SOLN
Status: DC | PRN
  Administered 2012-10-30: 1000 mL

## 2012-10-30 MED ORDER — LIDOCAINE-EPINEPHRINE 2 %-1:100000 IJ SOLN
INTRAMUSCULAR | Status: DC | PRN
  Administered 2012-10-30: 13 mL via INTRADERMAL

## 2012-10-30 MED ORDER — PHENYLEPHRINE HCL 10 MG/ML IJ SOLN
INTRAMUSCULAR | Status: DC | PRN
  Administered 2012-10-30 (×2): 80 ug via INTRAVENOUS

## 2012-10-30 MED ORDER — OXYCODONE-ACETAMINOPHEN 5-325 MG PO TABS: 1.0000 | ORAL_TABLET | ORAL | Status: DC | PRN

## 2012-10-30 MED ORDER — GLYCOPYRROLATE 0.2 MG/ML IJ SOLN
INTRAMUSCULAR | Status: DC | PRN
  Administered 2012-10-30: 0.2 mg via INTRAVENOUS

## 2012-10-30 MED ORDER — LIDOCAINE-EPINEPHRINE 2 %-1:100000 IJ SOLN
INTRAMUSCULAR | Status: AC
  Filled 2012-10-30: qty 1

## 2012-10-30 MED ORDER — PROMETHAZINE HCL 25 MG/ML IJ SOLN: 6.2500 mg | INTRAMUSCULAR | Status: DC | PRN

## 2012-10-30 MED ORDER — FENTANYL CITRATE 0.05 MG/ML IJ SOLN: 25.0000 ug | INTRAMUSCULAR | Status: DC | PRN

## 2012-10-30 MED ORDER — LACTATED RINGERS IV SOLN: INTRAVENOUS | Status: DC

## 2012-10-30 MED ORDER — OXYMETAZOLINE HCL 0.05 % NA SOLN
NASAL | Status: DC | PRN
  Administered 2012-10-30: 1 via NASAL

## 2012-10-30 MED ORDER — OXYCODONE HCL 5 MG/5ML PO SOLN: 5.0000 mg | Freq: Once | ORAL | Status: DC | PRN

## 2012-10-30 MED ORDER — SUCCINYLCHOLINE CHLORIDE 20 MG/ML IJ SOLN
INTRAMUSCULAR | Status: DC | PRN
  Administered 2012-10-30: 100 mg via INTRAVENOUS

## 2012-10-30 MED ORDER — ONDANSETRON HCL 4 MG/2ML IJ SOLN
INTRAMUSCULAR | Status: DC | PRN
  Administered 2012-10-30: 4 mg via INTRAMUSCULAR

## 2012-10-30 MED ORDER — OXYMETAZOLINE HCL 0.05 % NA SOLN
NASAL | Status: AC
  Filled 2012-10-30: qty 15

## 2012-10-30 MED ORDER — LIDOCAINE HCL (CARDIAC) 20 MG/ML IV SOLN
INTRAVENOUS | Status: DC | PRN
  Administered 2012-10-30: 60 mg via INTRAVENOUS

## 2012-10-30 MED ORDER — FENTANYL CITRATE 0.05 MG/ML IJ SOLN
INTRAMUSCULAR | Status: DC | PRN
  Administered 2012-10-30: 150 ug via INTRAVENOUS

## 2012-10-30 MED ORDER — SODIUM CHLORIDE 0.9 % IV SOLN
INTRAVENOUS | Status: DC
  Administered 2012-10-30 (×2): via INTRAVENOUS

## 2012-10-30 MED ORDER — OXYCODONE HCL 5 MG PO TABS: 5.0000 mg | ORAL_TABLET | Freq: Once | ORAL | Status: DC | PRN

## 2012-10-30 MED ORDER — MIDAZOLAM HCL 2 MG/2ML IJ SOLN: 0.5000 mg | Freq: Once | INTRAMUSCULAR | Status: DC | PRN

## 2012-10-30 MED ORDER — MEPERIDINE HCL 25 MG/ML IJ SOLN: 6.2500 mg | INTRAMUSCULAR | Status: DC | PRN

## 2012-10-30 SURGICAL SUPPLY — 27 items
BUR CROSS CUT FISSURE 1.6 (BURR) ×2 IMPLANT
BUR EGG ELITE 4.0 (BURR) IMPLANT
CANISTER SUCTION 2500CC (MISCELLANEOUS) ×2 IMPLANT
CLOTH BEACON ORANGE TIMEOUT ST (SAFETY) ×2 IMPLANT
COVER SURGICAL LIGHT HANDLE (MISCELLANEOUS) ×2 IMPLANT
CRADLE DONUT ADULT HEAD (MISCELLANEOUS) ×2 IMPLANT
DECANTER SPIKE VIAL GLASS SM (MISCELLANEOUS) ×2 IMPLANT
GAUZE PACKING FOLDED 2  STR (GAUZE/BANDAGES/DRESSINGS) ×1
GAUZE PACKING FOLDED 2 STR (GAUZE/BANDAGES/DRESSINGS) ×1 IMPLANT
GLOVE BIO SURGEON STRL SZ 6.5 (GLOVE) ×3 IMPLANT
GLOVE BIO SURGEON STRL SZ7.5 (GLOVE) ×2 IMPLANT
GLOVE BIOGEL PI IND STRL 7.0 (GLOVE) ×1 IMPLANT
GLOVE BIOGEL PI INDICATOR 7.0 (GLOVE) ×2
GOWN STRL NON-REIN LRG LVL3 (GOWN DISPOSABLE) ×2 IMPLANT
GOWN STRL REIN XL XLG (GOWN DISPOSABLE) ×2 IMPLANT
KIT BASIN OR (CUSTOM PROCEDURE TRAY) ×2 IMPLANT
KIT ROOM TURNOVER OR (KITS) ×2 IMPLANT
NEEDLE 22X1 1/2 (OR ONLY) (NEEDLE) ×2 IMPLANT
NS IRRIG 1000ML POUR BTL (IV SOLUTION) ×2 IMPLANT
PAD ARMBOARD 7.5X6 YLW CONV (MISCELLANEOUS) ×4 IMPLANT
SUT CHROMIC 3 0 PS 2 (SUTURE) ×2 IMPLANT
SYR CONTROL 10ML LL (SYRINGE) ×2 IMPLANT
TOWEL OR 17X26 10 PK STRL BLUE (TOWEL DISPOSABLE) ×2 IMPLANT
TRAY ENT MC OR (CUSTOM PROCEDURE TRAY) ×2 IMPLANT
TUBING IRRIGATION (MISCELLANEOUS) IMPLANT
WATER STERILE IRR 1000ML POUR (IV SOLUTION) IMPLANT
YANKAUER SUCT BULB TIP NO VENT (SUCTIONS) ×2 IMPLANT

## 2012-10-30 NOTE — Anesthesia Postprocedure Evaluation (Signed)
  Anesthesia Post-op Note  Patient: Cassidy Smith  Procedure(s) Performed: Procedure(s): MULTIPLE EXTRACION 5, 6, 8, 9, 10 ,18, 19, 31 WITH MAXILLARY RIGHT AND LEFT  ALVEOLOPLASTY REDUCE MAXILLARY LEFT TUBEROSITY (N/A)  Patient Location: PACU  Anesthesia Type:General  Level of Consciousness: awake, alert , oriented and patient cooperative  Airway and Oxygen Therapy: Patient Spontanous Breathing  Post-op Pain: none  Post-op Assessment: Post-op Vital signs reviewed, Patient's Cardiovascular Status Stable, Respiratory Function Stable, Patent Airway, No signs of Nausea or vomiting and Pain level controlled  Post-op Vital Signs: Reviewed and stable  Complications: No apparent anesthesia complications

## 2012-10-30 NOTE — Anesthesia Procedure Notes (Signed)
Procedure Name: Intubation Date/Time: 10/30/2012 9:22 AM Performed by: Gwenyth Allegra Pre-anesthesia Checklist: Patient identified, Timeout performed, Emergency Drugs available, Suction available and Patient being monitored Patient Re-evaluated:Patient Re-evaluated prior to inductionOxygen Delivery Method: Circle system utilized Preoxygenation: Pre-oxygenation with 100% oxygen Intubation Type: IV induction Ventilation: Mask ventilation without difficulty Laryngoscope Size: Mac and 4 Grade View: Grade II Nasal Tubes: Left, Magill forceps - small, utilized, Nasal prep performed and Nasal Rae Number of attempts: 1 Tube secured with: Tape Dental Injury: Teeth and Oropharynx as per pre-operative assessment

## 2012-10-30 NOTE — Anesthesia Preprocedure Evaluation (Addendum)
Anesthesia Evaluation  Patient identified by MRN, date of birth, ID band Patient awake    Reviewed: Allergy & Precautions, H&P , NPO status , Patient's Chart, lab work & pertinent test results  History of Anesthesia Complications Negative for: history of anesthetic complications  Airway Mallampati: I TM Distance: >3 FB Neck ROM: Full    Dental  (+) Poor Dentition   Pulmonary Current Smoker,  - rhonchi  + wheezing      Cardiovascular negative cardio ROS  Rhythm:Regular Rate:Normal     Neuro/Psych PSYCHIATRIC DISORDERS Anxiety Depression Bipolar Disorder    GI/Hepatic Neg liver ROS, GERD-  Controlled,  Endo/Other  negative endocrine ROS  Renal/GU negative Renal ROS   UTI    Musculoskeletal   Abdominal   Peds  Hematology negative hematology ROS (+)   Anesthesia Other Findings   Reproductive/Obstetrics                         Anesthesia Physical Anesthesia Plan  ASA: III  Anesthesia Plan: General   Post-op Pain Management:    Induction: Intravenous  Airway Management Planned: Oral ETT  Additional Equipment:   Intra-op Plan:   Post-operative Plan: Extubation in OR  Informed Consent: I have reviewed the patients History and Physical, chart, labs and discussed the procedure including the risks, benefits and alternatives for the proposed anesthesia with the patient or authorized representative who has indicated his/her understanding and acceptance.   Dental advisory given  Plan Discussed with: CRNA and Surgeon  Anesthesia Plan Comments: (Plan routine monitors, GETA)        Anesthesia Quick Evaluation

## 2012-10-30 NOTE — Preoperative (Signed)
Beta Blockers   Reason not to administer Beta Blockers:Not Applicable 

## 2012-10-30 NOTE — H&P (Signed)
H&P documentation  -History and Physical Reviewed  -Patient has been re-examined  -No change in the plan of care  Manveer Gomes M  

## 2012-10-30 NOTE — Transfer of Care (Signed)
Immediate Anesthesia Transfer of Care Note  Patient: Cassidy Smith  Procedure(s) Performed: Procedure(s): MULTIPLE EXTRACION 5, 6, 8, 9, 10 ,18, 19, 31 WITH MAXILLARY RIGHT AND LEFT  ALVEOLOPLASTY REDUCE MAXILLARY LEFT TUBEROSITY (N/A)  Patient Location: PACU  Anesthesia Type:General  Level of Consciousness: awake and alert   Airway & Oxygen Therapy: Patient Spontanous Breathing and Patient connected to nasal cannula oxygen  Post-op Assessment: Report given to PACU RN and Post -op Vital signs reviewed and stable  Post vital signs: Reviewed and stable  Complications: No apparent anesthesia complications

## 2012-10-30 NOTE — Op Note (Signed)
10/30/2012  9:48 AM  PATIENT:  Cassidy Smith  57 y.o. female  PRE-OPERATIVE DIAGNOSIS:  NON RESTORABLE TEETH 5, 6, 8, 9, 10 ,18, 19, 31 HYPERPLASTIC MAXILLARY LEFT TUBEROSITY   POST-OPERATIVE DIAGNOSIS:  SAME  PROCEDURE:  Procedure(s): MULTIPLE EXTRACION 5, 6, 8, 9, 10 ,18, 19, 31 WITH MAXILLARY RIGHT AND LEFT  ALVEOLOPLASTY, INSERTION IMMEDIATE DENTURE AND PARTIAL DENTURE  SURGEON:  Surgeon(s): Georgia Lopes, DDS  ANESTHESIA:   local and general  EBL:  minimal  DRAINS: none   SPECIMEN:  No Specimen  COUNTS:  YES  PLAN OF CARE: Discharge to home after PACU  PATIENT DISPOSITION:  PACU - hemodynamically stable.   PROCEDURE DETAILS: Dictation # [id number]  Georgia Lopes, DMD 10/30/2012 9:48 AM

## 2012-10-30 NOTE — Op Note (Signed)
NAME:  Cassidy Smith, Cassidy Smith               ACCOUNT NO.:  [id number]  MEDICAL RECORD NO.:  [id number]  LOCATION:  [facility]                         FACILITY:  [facility]  PHYSICIAN:  Georgia Lopes, M.D.  DATE OF BIRTH:  Mar 12, 1955  DATE OF PROCEDURE: DATE OF DISCHARGE:                              OPERATIVE REPORT   PREOPERATIVE DIAGNOSIS:  Nonrestorable teeth #5, 6, 8, 9, 10, 18, 19, 31, hyperplastic maxillary left tuberosity.  POSTOPERATIVE DIAGNOSIS:  Nonrestorable teeth #5, 6, 8, 9, 10, 18, 19, 31, hyperplastic maxillary left tuberosity.  PROCEDURE:  Extraction teeth #5, 6, 8, 9, 10, 18, 19, 31 maxillary right and left alveoplasty, insertion immediate maxillary immediate denture, and mandibular partial denture.  SURGEON:  Georgia Lopes, MD  ANESTHESIA:  General.  ANESTHESIA:  General, Dr. Jean Rosenthal attending, nasal intubation.  DESCRIPTION OF PROCEDURE:  The patient was taken to the operating room, placed on the table in supine position.  General anesthesia was administered intravenously and nasal endotracheal tube was placed and secured.  The eyes were protected and the patient was draped for the procedure.  A time-out was performed.  Then, the posterior pharynx was suctioned.  A throat pack was placed.  A 2% lidocaine 1:100,000 epinephrine was infiltrated and inferior alveolar block on the right and left side and a buccal and palatal infiltration on the maxilla, total of 13 mL was utilized.  Then a #15 blade was used to make an incision around teeth numbers 18 and 19 in the mandible and around teeth numbers 10, 9, and 8 in the maxilla.  Subperiosteal elevator was used to reflect the periosteum from around these teeth and the teeth were elevated with a 301 elevator and removed from the mouth with a dental forceps.  The sockets were then curetted and in the mandible, the area was irrigated and closed with 3-0 chromic in the maxilla.  The incision was not sutured at this point, the bite  block was repositioned to the other side of the mouth and then a 15 blade was used to make an incision around teeth #5 and 6 in the maxilla and around tooth #31 in the mandible.  The periosteum was reflected with a periosteal elevator and the teeth were removed in the maxilla with the upper universal forceps and in the mandible with the rongeur.  These areas were then curetted and no closure was performed on the right side.  Then, the maxillary upper denture was placed into the mouth and found to have good fit.  The area was reduced in the maxillary denture and no reduction of the tuberosity was necessary.  There was some bony interferences in the maxillary right and left alveolus and these were removed by performing alveoplasty with the egg-shaped bur in the maxilla on the right and left side.  The bone file was used to smooth this area, and then the area was irrigated.  The maxillary denture was reinserted and found to have a good fit at this time, then, the maxilla was closed with 3-0 chromic.  The mandibular partial denture was placed in the mouth and the dental clasps were adjusted to provide proper retention and the occlusion appeared to  be good between the upper and lower jaws.  Then, the oral cavity was irrigated, suctioned, throat pack was removed.  The patient was awakened and taken to the recovery room, breathing spontaneously in good condition.  EBL minimum.  Complications none.  Specimens none.     Georgia Lopes, M.D.     SMJ/MEDQ  D:  10/30/2012  T:  10/30/2012  Job:  [id number]

## 2012-11-02 ENCOUNTER — Encounter (HOSPITAL_COMMUNITY): Payer: Self-pay | Admitting: Oral Surgery

## 2013-05-12 ENCOUNTER — Inpatient Hospital Stay (HOSPITAL_COMMUNITY)
Admission: EM | Admit: 2013-05-12 | Discharge: 2013-05-14 | DRG: 643 | Disposition: A | Discharge status: Other Acute Inpatient Hospital | Payer: Medicaid Other | Attending: Family Medicine | Admitting: Family Medicine

## 2013-05-12 ENCOUNTER — Inpatient Hospital Stay (HOSPITAL_COMMUNITY): Payer: Medicaid Other

## 2013-05-12 ENCOUNTER — Encounter (HOSPITAL_COMMUNITY): Payer: Self-pay | Admitting: Emergency Medicine

## 2013-05-12 DIAGNOSIS — I6381 Other cerebral infarction due to occlusion or stenosis of small artery: Secondary | ICD-10-CM

## 2013-05-12 DIAGNOSIS — T43205A Adverse effect of unspecified antidepressants, initial encounter: Secondary | ICD-10-CM | POA: Diagnosis present

## 2013-05-12 DIAGNOSIS — R9082 White matter disease, unspecified: Secondary | ICD-10-CM

## 2013-05-12 DIAGNOSIS — T4275XA Adverse effect of unspecified antiepileptic and sedative-hypnotic drugs, initial encounter: Secondary | ICD-10-CM | POA: Diagnosis present

## 2013-05-12 DIAGNOSIS — F3110 Bipolar disorder, current episode manic without psychotic features, unspecified: Secondary | ICD-10-CM | POA: Diagnosis present

## 2013-05-12 DIAGNOSIS — G20A1 Parkinson's disease without dyskinesia, without mention of fluctuations: Secondary | ICD-10-CM | POA: Diagnosis present

## 2013-05-12 DIAGNOSIS — H547 Unspecified visual loss: Secondary | ICD-10-CM

## 2013-05-12 DIAGNOSIS — Z8659 Personal history of other mental and behavioral disorders: Secondary | ICD-10-CM

## 2013-05-12 DIAGNOSIS — E236 Other disorders of pituitary gland: Principal | ICD-10-CM | POA: Diagnosis present

## 2013-05-12 DIAGNOSIS — F319 Bipolar disorder, unspecified: Secondary | ICD-10-CM

## 2013-05-12 DIAGNOSIS — F411 Generalized anxiety disorder: Secondary | ICD-10-CM | POA: Diagnosis present

## 2013-05-12 DIAGNOSIS — E871 Hypo-osmolality and hyponatremia: Secondary | ICD-10-CM | POA: Diagnosis present

## 2013-05-12 DIAGNOSIS — N189 Chronic kidney disease, unspecified: Secondary | ICD-10-CM | POA: Diagnosis present

## 2013-05-12 DIAGNOSIS — Z803 Family history of malignant neoplasm of breast: Secondary | ICD-10-CM

## 2013-05-12 DIAGNOSIS — F309 Manic episode, unspecified: Secondary | ICD-10-CM

## 2013-05-12 DIAGNOSIS — F17200 Nicotine dependence, unspecified, uncomplicated: Secondary | ICD-10-CM | POA: Diagnosis present

## 2013-05-12 DIAGNOSIS — I6350 Cerebral infarction due to unspecified occlusion or stenosis of unspecified cerebral artery: Secondary | ICD-10-CM | POA: Diagnosis present

## 2013-05-12 DIAGNOSIS — Z8673 Personal history of transient ischemic attack (TIA), and cerebral infarction without residual deficits: Secondary | ICD-10-CM

## 2013-05-12 DIAGNOSIS — H43399 Other vitreous opacities, unspecified eye: Secondary | ICD-10-CM | POA: Diagnosis present

## 2013-05-12 DIAGNOSIS — H539 Unspecified visual disturbance: Secondary | ICD-10-CM | POA: Diagnosis present

## 2013-05-12 DIAGNOSIS — G20 Parkinson's disease: Secondary | ICD-10-CM | POA: Diagnosis present

## 2013-05-12 LAB — COMPREHENSIVE METABOLIC PANEL
ALT: 7 U/L (ref 0–35)
AST: 14 U/L (ref 0–37)
Albumin: 3.5 g/dL (ref 3.5–5.2)
Alkaline Phosphatase: 68 U/L (ref 39–117)
BUN: 6 mg/dL (ref 6–23)
CO2: 29 mEq/L (ref 19–32)
Calcium: 9.6 mg/dL (ref 8.4–10.5)
Chloride: 83 mEq/L — ABNORMAL LOW (ref 96–112)
Creatinine, Ser: 0.51 mg/dL (ref 0.50–1.10)
GFR calc non Af Amer: >90 mL/min (ref >90)
GLUCOSE: 99 mg/dL (ref 70–99)
Potassium: 3.8 mEq/L (ref 3.7–5.3)
Sodium: 122 mEq/L — ABNORMAL LOW (ref 137–147)
Total Bilirubin: 0.4 mg/dL (ref 0.3–1.2)
Total Protein: 6.6 g/dL (ref 6.0–8.3)

## 2013-05-12 LAB — OSMOLALITY, URINE: OSMOLALITY UR: 372 mosm/kg — AB (ref 390–1090)

## 2013-05-12 LAB — URINALYSIS, ROUTINE W REFLEX MICROSCOPIC
Bilirubin Urine: NEGATIVE
GLUCOSE, UA: NEGATIVE mg/dL
Ketones, ur: NEGATIVE mg/dL
Nitrite: NEGATIVE
PH: 7.5 (ref 5.0–8.0)
PROTEIN: NEGATIVE mg/dL
Specific Gravity, Urine: 1.01 (ref 1.005–1.030)
Urobilinogen, UA: 0.2 mg/dL (ref 0.0–1.0)

## 2013-05-12 LAB — URINE MICROSCOPIC-ADD ON

## 2013-05-12 LAB — CBC
HEMATOCRIT: 38.7 % (ref 36.0–46.0)
HEMOGLOBIN: 13.8 g/dL (ref 12.0–15.0)
MCH: 32.2 pg (ref 26.0–34.0)
MCHC: 35.7 g/dL (ref 30.0–36.0)
MCV: 90.4 fL (ref 78.0–100.0)
Platelets: ADEQUATE 10*3/uL (ref 150–400)
RBC: 4.28 MIL/uL (ref 3.87–5.11)
RDW: 12.8 % (ref 11.5–15.5)
WBC: 4.8 10*3/uL (ref 4.0–10.5)

## 2013-05-12 LAB — BASIC METABOLIC PANEL
BUN: 5 mg/dL — ABNORMAL LOW (ref 6–23)
CALCIUM: 9.1 mg/dL (ref 8.4–10.5)
CO2: 30 mEq/L (ref 19–32)
CREATININE: 0.51 mg/dL (ref 0.50–1.10)
Chloride: 88 mEq/L — ABNORMAL LOW (ref 96–112)
GFR calc Af Amer: >90 mL/min (ref >90)
GFR calc non Af Amer: >90 mL/min (ref >90)
Glucose, Bld: 123 mg/dL — ABNORMAL HIGH (ref 70–99)
Potassium: 4.3 mEq/L (ref 3.7–5.3)
Sodium: 127 mEq/L — ABNORMAL LOW (ref 137–147)

## 2013-05-12 LAB — VALPROIC ACID LEVEL: Valproic Acid Lvl: 102.8 ug/mL — ABNORMAL HIGH (ref 50.0–100.0)

## 2013-05-12 LAB — RAPID URINE DRUG SCREEN, HOSP PERFORMED
AMPHETAMINES: NOT DETECTED
BENZODIAZEPINES: NOT DETECTED
Barbiturates: NOT DETECTED
Cocaine: NOT DETECTED
OPIATES: NOT DETECTED
Tetrahydrocannabinol: NOT DETECTED

## 2013-05-12 LAB — OSMOLALITY: Osmolality: 257 mOsm/kg — ABNORMAL LOW (ref 275–300)

## 2013-05-12 LAB — ETHANOL

## 2013-05-12 LAB — CORTISOL: CORTISOL PLASMA: 7.8 ug/dL

## 2013-05-12 LAB — SODIUM, URINE, RANDOM: SODIUM UR: 104 meq/L

## 2013-05-12 LAB — TSH: TSH: 0.763 u[IU]/mL (ref 0.350–4.500)

## 2013-05-12 LAB — ACETAMINOPHEN LEVEL: Acetaminophen (Tylenol), Serum: <15 ug/mL (ref 10–30)

## 2013-05-12 LAB — SALICYLATE LEVEL: Salicylate Lvl: <2 mg/dL — ABNORMAL LOW (ref 2.8–20.0)

## 2013-05-12 MED ORDER — ACETAMINOPHEN 650 MG RE SUPP: 650.0000 mg | Freq: Four times a day (QID) | RECTAL | Status: DC | PRN

## 2013-05-12 MED ORDER — ATROPINE SULFATE 1 % OP SOLN
1.0000 [drp] | Freq: Once | OPHTHALMIC | Status: DC
  Filled 2013-05-12: qty 2

## 2013-05-12 MED ORDER — STROKE: EARLY STAGES OF RECOVERY BOOK
Freq: Once | Status: DC
  Filled 2013-05-12: qty 1

## 2013-05-12 MED ORDER — SODIUM CHLORIDE 0.9 % IV SOLN
INTRAVENOUS | Status: AC
  Administered 2013-05-12: 1000 mL via INTRAVENOUS

## 2013-05-12 MED ORDER — ASPIRIN 325 MG PO TABS
325.0000 mg | ORAL_TABLET | Freq: Every day | ORAL | Status: DC
  Administered 2013-05-13 – 2013-05-14 (×2): 325 mg via ORAL
  Filled 2013-05-12 (×2): qty 1

## 2013-05-12 MED ORDER — ACETAMINOPHEN 325 MG PO TABS
650.0000 mg | ORAL_TABLET | Freq: Four times a day (QID) | ORAL | Status: DC | PRN
  Administered 2013-05-13 – 2013-05-14 (×2): 650 mg via ORAL
  Filled 2013-05-12 (×2): qty 2

## 2013-05-12 MED ORDER — ASPIRIN 300 MG RE SUPP
300.0000 mg | Freq: Every day | RECTAL | Status: DC
  Filled 2013-05-12 (×3): qty 1

## 2013-05-12 MED ORDER — SODIUM CHLORIDE 0.9 % IV SOLN: 250.0000 mL | INTRAVENOUS | Status: DC | PRN

## 2013-05-12 MED ORDER — RISPERIDONE 1 MG PO TABS
8.0000 mg | ORAL_TABLET | Freq: Every day | ORAL | Status: DC
  Administered 2013-05-12: 8 mg via ORAL
  Filled 2013-05-12: qty 8

## 2013-05-12 MED ORDER — SODIUM CHLORIDE 0.9 % IJ SOLN
3.0000 mL | Freq: Two times a day (BID) | INTRAMUSCULAR | Status: DC
  Administered 2013-05-14: 3 mL via INTRAVENOUS

## 2013-05-12 MED ORDER — SODIUM CHLORIDE 0.9 % IJ SOLN: 3.0000 mL | INTRAMUSCULAR | Status: DC | PRN

## 2013-05-12 MED ORDER — DIVALPROEX SODIUM ER 500 MG PO TB24
1500.0000 mg | ORAL_TABLET | Freq: Every day | ORAL | Status: DC
  Administered 2013-05-12 – 2013-05-13 (×2): 1500 mg via ORAL
  Filled 2013-05-12 (×2): qty 3

## 2013-05-12 MED ORDER — ONDANSETRON HCL 4 MG/2ML IJ SOLN: 4.0000 mg | Freq: Three times a day (TID) | INTRAMUSCULAR | Status: AC | PRN

## 2013-05-12 MED ORDER — CLONIDINE HCL 0.1 MG PO TABS: 0.1000 mg | ORAL_TABLET | Freq: Four times a day (QID) | ORAL | Status: DC | PRN

## 2013-05-12 NOTE — Progress Notes (Signed)
Pt transferring to 337. Receiving RN has been given report. Pt transferred via wheelchair with all belongings.

## 2013-05-12 NOTE — ED Provider Notes (Signed)
CSN: [id number]     Arrival date & time 05/12/13  0121 History   First MD Initiated Contact with Patient 05/12/13 0127     Chief Complaint  Patient presents with  . Eye Problem     (Consider location/radiation/quality/duration/timing/severity/associated sxs/prior Treatment) HPI Comments: 58 year old female, history of bipolar disorder, anxiety, depression, chronic tremor of the hands and chronic kidney disease who presents with the complaint of chocolate in the eyeball. 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 She is unable to tell me what the argument was about, she states that shortly thereafter she developed a complete loss of vision, she is unable to tell me if this was her right eye, her left eye or both eyes. She then changes her story and states that she had no visual changes until after she woke up approximately one hour ago when she noticed lack of vision in the right eye, or possibly the left eye, and associated brown spot that was flying at her face. She then states that her doctor in [place] was wondering whether she had herpes or chocolate in the eyeball. She denies alcohol use or illegal drug use. The patient is unable to hold a train of thought, level V caveat Apply secondary to abnormal mental status  Patient is a 58 y.o. female presenting with eye problem. The history is provided by the patient.  Eye Problem   Past Medical History  Diagnosis Date  . Bipolar 1 disorder   . Anxiety   . Depression   . Headache(784.0)   . Neuromuscular disorder     shaking of hands   . Chronic kidney disease     uti  currently,  hx bladder spasms   Past Surgical History  Procedure Laterality Date  . No past surgeries    . Multiple extractions with alveoloplasty N/A 10/30/2012    Procedure: MULTIPLE EXTRACION 5, 6, 8, 9, 10 ,18, 19, 31 WITH  MAXILLARY RIGHT AND LEFT  ALVEOLOPLASTY REDUCE MAXILLARY LEFT TUBEROSITY;  Surgeon: Gae Bon, DDS;  Location: [place];  Service: Oral Surgery;  Laterality: N/A;   No family history on file. History  Substance Use Topics  . Smoking status: Current Every Day Smoker -- 1.00 packs/day for 20 years    Types: Cigarettes  . Smokeless tobacco: Not on file  . Alcohol Use: No   OB History   Grav Para Term Preterm Abortions TAB SAB Ect Mult Living                 Review of Systems  Unable to perform ROS: Mental status change      Allergies  Review of patient's allergies indicates no known allergies.  Home Medications   Prior to Admission medications   Medication Sig Start Date End Date Taking? Authorizing Provider  Bisacodyl (LAXATIVE PO) Take 30 mLs by mouth daily as needed (constipation).    Historical Provider, MD  divalproex (DEPAKOTE ER) 500 MG 24 hr tablet Take 1,500 mg by mouth at bedtime.     Historical Provider, MD  oxybutynin (DITROPAN) 5 MG tablet Take 2.5 mg by mouth 2 (two) times daily.    Historical Provider, MD  oxyCODONE-acetaminophen (PERCOCET) 5-325 MG per tablet Take 1-2 tablets by mouth every 4 (four) hours as needed for pain. 10/30/12   Gae Bon, DDS  risperidone (RISPERDAL) 4 MG tablet Take 4 mg by mouth at bedtime.     Historical Provider, MD  traZODone (DESYREL) 50 MG tablet Take 50 mg by mouth at bedtime.     Historical Provider, MD   BP 135/95  Pulse 78  Temp(Src) 97.6 F (36.4 C) (Oral)  Resp 16  Ht 5\' 6"  (1.676 m)  Wt 120 lb (54.432 kg)  BMI 19.38 kg/m2  SpO2 97% Physical Exam  Nursing note and vitals reviewed. Constitutional: She appears well-developed and well-nourished. No distress.  HENT:  Head: Normocephalic and atraumatic.  Mouth/Throat: Oropharynx is clear and moist. No oropharyngeal exudate.  Eyes: Conjunctivae and EOM are normal. Pupils are equal, round, and reactive to light. Right eye exhibits no discharge. Left eye exhibits no  discharge. No scleral icterus.  Pupils 2 mm and reactive bilaterally, redness poorly visualized secondary to miosis  Neck: Normal range of motion. Neck supple. No JVD present. No thyromegaly present.  Cardiovascular: Normal rate, regular rhythm, normal heart sounds and intact distal pulses.  Exam reveals no gallop and no friction rub.   No murmur heard. Pulmonary/Chest: Effort normal and breath sounds normal. No respiratory distress. She has no wheezes. She has no rales.  Abdominal: Soft. Bowel sounds are normal. She exhibits no distension and no mass. There is no tenderness.  Musculoskeletal: Normal range of motion. She exhibits no edema and no tenderness.  Lymphadenopathy:    She has no cervical adenopathy.  Neurological: She is alert. Coordination normal.  Cranial nerves III through XII grossly intact, speech is clear but slow, train of thought is tangential, flights of ideas present, moves all extremities x4 without difficulty, no pronator drift, normal grips bilaterally  Skin: Skin is warm and dry. No rash noted. No erythema.  Psychiatric:  The patient denies substance abuse, has a flat and slightly bizarre affect, days that her body feels like it is being ripped apart, and then asks about an STD that you get with sexual activity which prompts the discussion about her doctor thinking she might have herpes or chocolate in her eyeball. She then talks about her brother who is in a car accident many years ago, asking what she should do about this, answers her own question stating she should talk to a lawyer    ED Course  Procedures (including critical care time) Labs Review Labs Reviewed  COMPREHENSIVE METABOLIC PANEL - Abnormal; Notable for the following:    Sodium 122 (*)    Chloride 83 (*)    All other components within normal limits  URINALYSIS, ROUTINE W REFLEX MICROSCOPIC - Abnormal; Notable for the following:    Hgb urine dipstick TRACE (*)    Leukocytes, UA TRACE (*)    All other  components within normal limits  SALICYLATE LEVEL - Abnormal; Notable for the following:    Salicylate Lvl <0.1 (*)    All other components within normal limits  URINE MICROSCOPIC-ADD ON - Abnormal; Notable for the following:    Squamous Epithelial / LPF MANY (*)    Bacteria, UA FEW (*)    All other components within normal limits  VALPROIC ACID LEVEL - Abnormal; Notable for the following:    Valproic Acid Lvl 102.8 (*)    All other components within normal limits  CBC  ETHANOL  URINE RAPID DRUG SCREEN (HOSP PERFORMED)  ACETAMINOPHEN LEVEL    Imaging Review No results found.    MDM   Final diagnoses:  Hyponatremia  Mania    The patient  appears to have flights of ideas and tangential thought consistent with a manic episode. She endorses taking her medication including Risperdal and Depakote in her sleep medication trazodone, ocular exam is limited by her small pupils however the history does not suggest an acute injury to the eye, central retinal artery occlusion, stroke or detached retina, will obtain visual acuity and further evaluated for psychiatric workup.  Labs show hyponatremia, no other acute findings, discussed with hospitalist who will admit for hyponatremia.  Johnna Acosta, MD 05/12/13 [phone]

## 2013-05-12 NOTE — ED Notes (Signed)
sleeping

## 2013-05-12 NOTE — Progress Notes (Signed)
Late entry 1850:  Patient stated she was having a hard time breathing.  O2 sats on RA 98%.  Respirations even and unlabored.  Stated "starting to feel better".  Will continue to monitor.

## 2013-05-12 NOTE — ED Notes (Signed)
Pt states about an hour ago, she started seeing a brown spot in front of her eyes,  Denies pain or other complaints

## 2013-05-12 NOTE — Progress Notes (Signed)
PROGRESS NOTE  Cassidy Smith XBJ:[id number] DOB: 1955-01-23 DOA: 05/12/2013 PCP: No PCP Per Patient  Summary: 58 year old woman with history of anxiety, depression, bipolar 1 disorder presents to the emergency department after an argument with a coworker with vague and changing complaints of vision loss in the left, right or both eyes. History was noted to change by EDP and there was no evidence of acute abnormality. Manic episode with suspected and psychiatric evaluation was planned, however labs revealed hyponatremia and therefore the patient was referred for admission.  Assessment/Plan: 1. Hyponatremia. Appears to be a long-standing problem with hyponatremia noted 06/2009, 07/2009, 10/2010, 07/2012, 08/2012, 10/2012. Low chloride suggest component of dehydration which patient endorses (poor oral intake). TSH, cortisol, serum osm, urine sodium, urine osm pending. Possibly SIADH-effect from Depakote, trazodone. 2. Dehydration. 3. Subacute left frontal lobe lacunar infarct. Asymptomatic. Surprising finding. Further evaluation as below. 4. Questionable of vision loss. Patient describes floaters, currently has no symptoms and visual testing is unremarkable. At this point no reason to suspect vision loss. 5. Decompensated bipolar disorder with suspected acute mania maintained on Risperdal, Depakote, trazodone as an outpatient. 6. Anxiety, depression 7. Chronic bilateral hand tremor   Follow with serial BMP. Suspect hypovolemic hyponatremia, likely complicated by chronic hyponatremia likely secondary to bipolar medications. Doubt will fully correct. Acute mania unrelated.  Continue IV fluids.  Stroke evaluation. Transfer to telemetry. Neurology consultation. Start aspirin.  Once medically stable, psychiatry consultation, and may need inpatient stabilization for mania. Patient denies suicidality.  Code Status: full code DVT prophylaxis: SCDs (early ambulation) Family Communication: none  present Disposition Plan: pending further evaluation  Murray Hodgkins, MD  [facility]  Pager [phone] If 7PM-7AM, please contact night-coverage at [email or web address], password [facility] 05/12/2013, 12:10 PM  LOS: 0 days   Consultants:  Neurology  Procedures:    Antibiotics:    HPI/Subjective: According to nursing, patient's brother called to express concerns for mental stability of patient with increasing mania.  Patient feels well, she denies suicidal ideation. No loss of feeling or soap. She reports yesterday she had some spots before her eyes however this has resolved. She is seeing well today without any visual complaints. When asked in detail her responses vary and change from moment to moment, responses are often tangential. She denies paresthesias or focal neurologic deficits.  Objective: Filed Vitals:   05/12/13 0203 05/12/13 0426 05/12/13 0742  BP: 135/95 166/85 156/88  Pulse: 78 73 70  Temp: 97.6 F (36.4 C) 97.8 F (36.6 C) 97.7 F (36.5 C)  TempSrc: Oral  Oral  Resp: 16  18  Height: 5\' 6"  (1.676 m)  5\' 5"  (1.651 m)  Weight: 54.432 kg (120 lb)  54.432 kg (120 lb)  SpO2: 97% 99% 94%   No intake or output data in the 24 hours ending 05/12/13 1210   Filed Weights   05/12/13 0203 05/12/13 0742  Weight: 54.432 kg (120 lb) 54.432 kg (120 lb)    Exam:   Afebrile, vital signs are stable. Exam and history conducted with chaperone Penni Homans, RN at bedside.  Gen. Appears calm, comfortable. Redirectable. Cooperates with examination. Pleasant.  Psychiatric. Tangential thoughts, grossly normal affect, mood difficult to assess. Oriented to self, [facility], month, year. Reports that she thinks her brother is monitoring her through telephone wires.  Eyes. Pupils equal, round, reactive to light. Extraocular movements intact. Able to read without difficulty. No dysdiadochokinesis with one eye blocked (both eyes). No evidence of visual abnormality.  ENT  appears grossly  unremarkable.  Cardiovascular. Regular rate and rhythm. No murmur, rub or gallop. No lower tremor the edema.  Respiratory clear to auscultation bilaterally. No wheezes, rales or rhonchi. Normal respiratory effort.  Skin appears grossly unremarkable.  Musculoskeletal. Excellent tone and strength all extremities grossly normal.  Neurologic. Cranial nerves 2-12 intact. Speech fluent and clear.  Data Reviewed:  Sodium 122, chloride 83 on admission.  Salicylate, Tylenol levels negative. Alcohol level negative. Urine drug screen unremarkable. Valproic acid level slightly high.  MRI brain revealed subacute left frontal lobe lacunar infarct.  Scheduled Meds: . atropine  1 drop Both Eyes Once  . sodium chloride  3 mL Intravenous Q12H   Continuous Infusions: . sodium chloride 1,000 mL (05/12/13 0431)    Principal Problem:   Hyponatremia Active Problems:   Bipolar disorder   Lacunar infarct, acute   Time spent 25 minutes

## 2013-05-12 NOTE — Progress Notes (Signed)
Pt's brother Lorrin Goodell) called me to express his concerns for pt's mental stability. He stated that the pt has been acting more and more manic and that she has expressed that she wants to buy a casket. I asked the pt if she was suicidal or had any feelings of hurting herself and she denied both. I asked her if she had a plan to hurt herself and she said no, but that she has thought about it in the past. Social work is aware and they will be addressing the issue with the pt's MD. Will continue to monitor.

## 2013-05-12 NOTE — H&P (Signed)
Cassidy Smith is an 58 y.o. female.   Pcp: unassigned Psychiatry Dr. Hoyle Barr  ?  Chief Complaint: floaters HPI: 58 yo female with bipolar disorder c/o ? Vision loss in both eyes for about 1 hour.  Per ED she complained of floaters,  She appears to change her story.  Pt's vision intact at this time.  Pt denies headache or focal neurological change.  Pt was found to be hyponatremic with sodium 122 in ED. Ed requesting admission for hyponatremia.    Past Medical History  Diagnosis Date  . Bipolar 1 disorder   . Anxiety   . Depression   . Headache(784.0)   . Neuromuscular disorder     shaking of hands   . Chronic kidney disease     uti  currently,  hx bladder spasms    Past Surgical History  Procedure Laterality Date  . No past surgeries    . Multiple extractions with alveoloplasty N/A 10/30/2012    Procedure: MULTIPLE EXTRACION 5, 6, 8, 9, 10 ,18, 19, 31 WITH MAXILLARY RIGHT AND LEFT  ALVEOLOPLASTY REDUCE MAXILLARY LEFT TUBEROSITY;  Surgeon: Gae Bon, DDS;  Location: [facility];  Service: Oral Surgery;  Laterality: N/A;    Family History  Problem Relation Age of Onset  . Breast cancer Mother   . Alcoholism Father    Social History:  reports that she has been smoking Cigarettes.  She has a 20 pack-year smoking history. She does not have any smokeless tobacco history on file. She reports that she does not drink alcohol or use illicit drugs.  Allergies: No Known Allergies   (Not in a hospital admission)  Results for orders placed during the hospital encounter of 05/12/13 (from the past 48 hour(s))  CBC     Status: None   Collection Time    05/12/13  2:04 AM      Result Value Ref Range   WBC 4.8  4.0 - 10.5 K/uL   RBC 4.28  3.87 - 5.11 MIL/uL   Hemoglobin 13.8  12.0 - 15.0 g/dL   HCT 38.7  36.0 - 46.0 %   MCV 90.4  78.0 - 100.0 fL   MCH 32.2  26.0 - 34.0 pg   MCHC 35.7  30.0 - 36.0 g/dL   RDW 12.8  11.5 - 15.5 %   Platelets    150 - 400 K/uL   Value: PLATELET CLUMPS NOTED  ON SMEAR, COUNT APPEARS ADEQUATE   Comment: SMEAR STAINED AND AVAILABLE FOR REVIEW  COMPREHENSIVE METABOLIC PANEL     Status: Abnormal   Collection Time    05/12/13  2:04 AM      Result Value Ref Range   Sodium 122 (*) 137 - 147 mEq/L   Potassium 3.8  3.7 - 5.3 mEq/L   Chloride 83 (*) 96 - 112 mEq/L   CO2 29  19 - 32 mEq/L   Glucose, Bld 99  70 - 99 mg/dL   BUN 6  6 - 23 mg/dL   Creatinine, Ser 0.51  0.50 - 1.10 mg/dL   Calcium 9.6  8.4 - 10.5 mg/dL   Total Protein 6.6  6.0 - 8.3 g/dL   Albumin 3.5  3.5 - 5.2 g/dL   AST 14  0 - 37 U/L   ALT 7  0 - 35 U/L   Alkaline Phosphatase 68  39 - 117 U/L   Total Bilirubin 0.4  0.3 - 1.2 mg/dL   GFR calc non Af Amer >90  >  90 mL/min   GFR calc Af Amer >90  >90 mL/min   Comment: (NOTE)     The eGFR has been calculated using the CKD EPI equation.     This calculation has not been validated in all clinical situations.     eGFR's persistently <90 mL/min signify possible Chronic Kidney     Disease.  ETHANOL     Status: None   Collection Time    05/12/13  2:04 AM      Result Value Ref Range   Alcohol, Ethyl (B) <11  0 - 11 mg/dL   Comment:            LOWEST DETECTABLE LIMIT FOR     SERUM ALCOHOL IS 11 mg/dL     FOR MEDICAL PURPOSES ONLY  ACETAMINOPHEN LEVEL     Status: None   Collection Time    05/12/13  2:04 AM      Result Value Ref Range   Acetaminophen (Tylenol), Serum <15.0  10 - 30 ug/mL   Comment:            THERAPEUTIC CONCENTRATIONS VARY     SIGNIFICANTLY. A RANGE OF 10-30     ug/mL MAY BE AN EFFECTIVE     CONCENTRATION FOR MANY PATIENTS.     HOWEVER, SOME ARE BEST TREATED     AT CONCENTRATIONS OUTSIDE THIS     RANGE.     ACETAMINOPHEN CONCENTRATIONS     >150 ug/mL AT 4 HOURS AFTER     INGESTION AND >50 ug/mL AT 12     HOURS AFTER INGESTION ARE     OFTEN ASSOCIATED WITH TOXIC     REACTIONS.  SALICYLATE LEVEL     Status: Abnormal   Collection Time    05/12/13  2:04 AM      Result Value Ref Range   Salicylate Lvl <2.4  (*) 2.8 - 20.0 mg/dL  URINE RAPID DRUG SCREEN (HOSP PERFORMED)     Status: None   Collection Time    05/12/13  2:10 AM      Result Value Ref Range   Opiates NONE DETECTED  NONE DETECTED   Cocaine NONE DETECTED  NONE DETECTED   Benzodiazepines NONE DETECTED  NONE DETECTED   Amphetamines NONE DETECTED  NONE DETECTED   Tetrahydrocannabinol NONE DETECTED  NONE DETECTED   Barbiturates NONE DETECTED  NONE DETECTED   Comment:            DRUG SCREEN FOR MEDICAL PURPOSES     ONLY.  IF CONFIRMATION IS NEEDED     FOR ANY PURPOSE, NOTIFY LAB     WITHIN 5 DAYS.                LOWEST DETECTABLE LIMITS     FOR URINE DRUG SCREEN     Drug Class       Cutoff (ng/mL)     Amphetamine      1000     Barbiturate      200     Benzodiazepine   462     Tricyclics       863     Opiates          300     Cocaine          300     THC              50  URINALYSIS, ROUTINE W REFLEX MICROSCOPIC     Status: Abnormal   Collection Time  05/12/13  2:10 AM      Result Value Ref Range   Color, Urine YELLOW  YELLOW   APPearance CLEAR  CLEAR   Specific Gravity, Urine 1.010  1.005 - 1.030   pH 7.5  5.0 - 8.0   Glucose, UA NEGATIVE  NEGATIVE mg/dL   Hgb urine dipstick TRACE (*) NEGATIVE   Bilirubin Urine NEGATIVE  NEGATIVE   Ketones, ur NEGATIVE  NEGATIVE mg/dL   Protein, ur NEGATIVE  NEGATIVE mg/dL   Urobilinogen, UA 0.2  0.0 - 1.0 mg/dL   Nitrite NEGATIVE  NEGATIVE   Leukocytes, UA TRACE (*) NEGATIVE  URINE MICROSCOPIC-ADD ON     Status: Abnormal   Collection Time    05/12/13  2:10 AM      Result Value Ref Range   Squamous Epithelial / LPF MANY (*) RARE   WBC, UA 3-6  <3 WBC/hpf   RBC / HPF 0-2  <3 RBC/hpf   Bacteria, UA FEW (*) RARE  VALPROIC ACID LEVEL     Status: Abnormal   Collection Time    05/12/13  2:45 AM      Result Value Ref Range   Valproic Acid Lvl 102.8 (*) 50.0 - 100.0 ug/mL   No results found.  ROS negative for all organ systems except for + above.  Slight dysuria.   Blood  pressure 166/85, pulse 73, temperature 97.8 F (36.6 C), temperature source Oral, resp. rate 16, height _0  (1.676 m), weight 120 lb (54.432 kg), SpO2 99.00%. Physical Exam   Heent: anicteric Neck: no jvd, no bruit, no tm,  Heart: rrr s1, s2, no m/g/r Lung: ctab Abd: soft, nt, nd, +bs Ext: no c/c/e Skin: no flank pain Lymph: no adenoapthy Neuro: nonfocal  Assessment/Plan Vision loss:  Check MRI brain Hyponatremia: check tsh, cortisol, serum osm, urine sodium, urine osm Bipolar do: cont current psychiatric medications   Jani Gravel 05/12/2013, 4:46 AM

## 2013-05-13 DIAGNOSIS — I319 Disease of pericardium, unspecified: Secondary | ICD-10-CM

## 2013-05-13 DIAGNOSIS — I6350 Cerebral infarction due to unspecified occlusion or stenosis of unspecified cerebral artery: Secondary | ICD-10-CM

## 2013-05-13 LAB — HOMOCYSTEINE: HOMOCYSTEINE-NORM: 8.4 umol/L (ref 4.0–15.4)

## 2013-05-13 LAB — BASIC METABOLIC PANEL
BUN: 8 mg/dL (ref 6–23)
CO2: 29 mEq/L (ref 19–32)
CREATININE: 0.56 mg/dL (ref 0.50–1.10)
Calcium: 9.6 mg/dL (ref 8.4–10.5)
Chloride: 91 mEq/L — ABNORMAL LOW (ref 96–112)
GFR calc non Af Amer: >90 mL/min (ref >90)
Glucose, Bld: 93 mg/dL (ref 70–99)
POTASSIUM: 3.9 meq/L (ref 3.7–5.3)
Sodium: 130 mEq/L — ABNORMAL LOW (ref 137–147)

## 2013-05-13 LAB — CK TOTAL AND CKMB (NOT AT ARMC)
CK, MB: 2.2 ng/mL (ref 0.3–4.0)
Relative Index: INVALID (ref 0.0–2.5)
Total CK: 66 U/L (ref 7–177)

## 2013-05-13 LAB — APTT: aPTT: 30 seconds (ref 24–37)

## 2013-05-13 LAB — HEMOGLOBIN A1C
Hgb A1c MFr Bld: 5.4 % (ref <5.7)
Mean Plasma Glucose: 108 mg/dL (ref <117)

## 2013-05-13 LAB — VITAMIN B12: Vitamin B-12: 1505 pg/mL — ABNORMAL HIGH (ref 211–911)

## 2013-05-13 LAB — SEDIMENTATION RATE: Sed Rate: 2 mm/hr (ref 0–22)

## 2013-05-13 LAB — LIPID PANEL
CHOL/HDL RATIO: 1.8 ratio
CHOLESTEROL: 202 mg/dL — AB (ref 0–200)
HDL: 113 mg/dL (ref >39)
LDL Cholesterol: 72 mg/dL (ref 0–99)
Triglycerides: 86 mg/dL (ref <150)
VLDL: 17 mg/dL (ref 0–40)

## 2013-05-13 LAB — PROTIME-INR
INR: 1.07 (ref 0.00–1.49)
PROTHROMBIN TIME: 13.7 s (ref 11.6–15.2)

## 2013-05-13 LAB — RPR

## 2013-05-13 LAB — HIV ANTIBODY (ROUTINE TESTING W REFLEX): HIV 1&2 Ab, 4th Generation: NONREACTIVE

## 2013-05-13 MED ORDER — RISPERIDONE 1 MG PO TABS
4.0000 mg | ORAL_TABLET | Freq: Every day | ORAL | Status: DC
  Administered 2013-05-13: 4 mg via ORAL
  Filled 2013-05-13: qty 4

## 2013-05-13 MED ORDER — SODIUM CHLORIDE 0.9 % IV SOLN
INTRAVENOUS | Status: DC
  Administered 2013-05-13: 18:00:00 via INTRAVENOUS

## 2013-05-13 NOTE — Evaluation (Signed)
Physical Therapy Evaluation Patient Details Name: Cassidy Smith MRN: [id number] DOB: 02-10-1955 Today's Date: 05/13/2013   History of Present Illness  Pt was admitted after she experienced a 1 hour loss of vision.  She has a hx of bipolar disease and was found to have an acute left frontal lobe lacunar infarct.  She states that she lives with her family and is normally independent with all ADLS using no assistive device.  Clinical Impression   Pt was seen for evaluation.  She was very pleasant and cooperative, oriented x4 and appropriate.  She had no abnormalities of strength, balance or coordination that I could detect.    Follow Up Recommendations No PT follow up    Equipment Recommendations  None recommended by PT    Recommendations for Other Services   none    Precautions / Restrictions Precautions Precautions: None Restrictions Weight Bearing Restrictions: No      Mobility  Bed Mobility Overal bed mobility: Independent                Transfers Overall transfer level: Independent Equipment used: None                Ambulation/Gait Ambulation/Gait assistance: Independent   Assistive device: None Gait Pattern/deviations: WFL(Within Functional Limits)   Gait velocity interpretation: at or above normal speed for age/gender    Stairs Stairs: Yes Stairs assistance: Independent Stair Management: One rail Right;Alternating pattern;Forwards Number of Stairs: 12        Modified Rankin (Stroke Patients Only) Modified Rankin (Stroke Patients Only) Pre-Morbid Rankin Score: No symptoms Modified Rankin: No symptoms     Balance Overall balance assessment: Independent                               Standardized Balance Assessment Standardized Balance Assessment : Dynamic Gait Index   Dynamic Gait Index Level Surface: Normal Change in Gait Speed: Normal Gait with Horizontal Head Turns: Normal Gait with Vertical Head Turns:  Normal Gait and Pivot Turn: Normal Step Over Obstacle: Normal Step Around Obstacles: Normal Steps: Normal Total Score: 24            Home Living Family/patient expects to be discharged to:: Private residence Living Arrangements: Other relatives Available Help at Discharge: Family;Available 24 hours/day Type of Home: House Home Access: Stairs to enter Entrance Stairs-Rails: Right Entrance Stairs-Number of Steps: 1 Home Layout: Two level;Bed/bath upstairs Home Equipment: None      Prior Function Level of Independence: Independent               Hand Dominance    right    Extremity/Trunk Assessment   Upper Extremity Assessment: Overall WFL for tasks assessed           Lower Extremity Assessment: Overall WFL for tasks assessed         Communication   Communication: No difficulties  Cognition Arousal/Alertness: Awake/alert Behavior During Therapy: WFL for tasks assessed/performed Overall Cognitive Status: Within Functional Limits for tasks assessed                                    Assessment/Plan    PT Assessment Patent does not need any further PT services  PT Diagnosis     PT Problem List    PT Treatment Interventions     PT Goals (Current goals can be found  in the Care Plan section) Acute Rehab PT Goals PT Goal Formulation: No goals set, d/c therapy         Barriers to discharge  none                     End of Session Equipment Utilized During Treatment: Gait belt Activity Tolerance: Patient tolerated treatment well Patient left: in chair Nurse Communication: Mobility status         Time: 0272-5366 PT Time Calculation (min): 17 min   Charges:   PT Evaluation $Initial PT Evaluation Tier I: 1 Procedure     PT G Codes:          Sable Feil 05/13/2013, 12:20 PM

## 2013-05-13 NOTE — Progress Notes (Signed)
Utilization Review Complete  

## 2013-05-13 NOTE — Consult Note (Signed)
[place] A. Merlene Laughter, MD     [email or web address]          Cassidy Smith is an 58 y.o. female.   ASSESSMENT/PLAN: 1. Tiny left frontal parietal lacunar infarct. It's unclear if this is actually symptomatic. I doubt that the tiny lacunar infarct has essentially cause any symptoms in this patient. Risk factors only age. Currently no history of hypertension or diabetes. 81 mg aspirin and should suffice for secondary stroke prevention. Avoid tobacco products.  2. Asymmetric rest tremor most consistent with Parkinson disease. Undoubtedly, the neuroleptic respirdal is likely aggravating this problem. We will reduce the dose from 8 mg to 4 mg a day.   3. Extensive white matter chronic lesions which are concerning for MS although clinically she doesn't appear to have MS. Additional labs will be obtained for homocysteine, ANA, sedimentation rate, HIV and vitamin B12 level. We also may consider doing a spinal tap in the outpatient setting. Alternatively, repeating the MRI in about 6-12 months is recommended. Cervical spine MRI will also be considered at a later date.   This is a 58 year old white female who presents with floaters for several minutes. She presents to the emergency room for further evaluation. She also reports having loss of vision involving both eyes lasting for several minutes. On workup the patient was found to be hyponatremic 122 and subsequent he was admitted for evaluation. She reports also having dizziness described as lightheaded sensation spinning with these symptoms. She may have had some headaches and also some mild dyspnea. She denies chest pain, focal numbness, focal weakness or bowel or bladder symptoms. The patient does not report having any symptoms of numbness and tingling across the abdomen or chest the past. She has never had complete loss of vision other than as stated above. The patient is noted to have a rest tremor involving the right upper  extremity. She tells me that she's had a stroke about 5-6 years. She seemed to have difficulty relating much of her medical history. She tells me that she has not seen a primary care provider in many years. She does not report having history of hypertension or diabetes. She apparently has been living in this area from about 2 years. She moved from [place] area. She is on respirdal and has been on this medication for about 5-6 years which seems to be commensurate with the onset of her tremors. Again, the tremor is essentially involving the right side for the most part.  GENERAL:  This is a pleasant thin lady in no acute distress.  HEENT: Supple. Atraumatic normocephalic.   ABDOMEN: soft  EXTREMITIES: No edema   BACK: Normal.  SKIN: Normal by inspection.    MENTAL STATUS: Alert and oriented. Speech, language and cognition are generally Unremarkable. Judgment and insight Seems limited.Marland Kitchen   CRANIAL NERVES: Pupils are equal, round and reactive to light and accommodation; extra ocular movements are full, there is no significant nystagmus; visual fields are full; upper and lower facial muscles are normal in strength and symmetric, there is no flattening of the nasolabial folds; tongue is midline; uvula is midline; shoulder elevation is normal.  MOTOR: She has left upper extremity weakness. Deltoid 4+/5. Distal strength in the left upper extremity is normal. The other extremities shows normal tone, bulk and strength.  COORDINATION: Left finger to nose is normal, right finger to nose is normal, There is a continuous moderate amplitude moderate frequency pin rolling tremor involving the right upper extremity. Occasionally she may have  some tremor involving the left upper extremity but this is a quite infrequent. The character is somewhat lower amplitude higher frequency. There are no postural tremors are action tremors. I appreciated no cogwheeling even with augmentation. She does not have evidence of  bradykinesia and has good hand dexterity.  REFLEXES: Deep tendon reflexes are symmetrical and normal. Babinski reflexes are flexor bilaterally.   SENSATION: Normal to light touch.   The patient's brain MRI is reviewed in person. There is a tiny signal seen involving the left parietal frontal region. It is not as bright as with typical will be seen for acute infarct in the suggest more of a subacute in order of one to 2 weeks. More concerning is significant white matter lesions With the orientation concerning for multiple sclerosis being perpendicular to the ventricles. Additionally, there is some evidence of confluent signal of these lesions.    Past Medical History  Diagnosis Date  . Bipolar 1 disorder   . Anxiety   . Depression   . Headache(784.0)   . Neuromuscular disorder     shaking of hands   . Chronic kidney disease     uti  currently,  hx bladder spasms    Past Surgical History  Procedure Laterality Date  . No past surgeries    . Multiple extractions with alveoloplasty N/A 10/30/2012    Procedure: MULTIPLE EXTRACION 5, 6, 8, 9, 10 ,18, 19, 31 WITH MAXILLARY RIGHT AND LEFT  ALVEOLOPLASTY REDUCE MAXILLARY LEFT TUBEROSITY;  Surgeon: Gae Bon, DDS;  Location: [facility];  Service: Oral Surgery;  Laterality: N/A;    Family History  Problem Relation Age of Onset  . Breast cancer Mother   . Alcoholism Father     Social History:  reports that she has been smoking Cigarettes.  She has a 20 pack-year smoking history. She does not have any smokeless tobacco history on file. She reports that she does not drink alcohol or use illicit drugs.  Allergies: No Known Allergies  Medications: Prior to Admission medications   Medication Sig Start Date End Date Taking? Authorizing Provider  Cyanocobalamin (VITAMIN B 12 PO) Take 1 tablet by mouth daily.   Yes Historical Provider, MD  divalproex (DEPAKOTE ER) 500 MG 24 hr tablet Take 1,500 mg by mouth at bedtime.    Yes Historical  Provider, MD  risperidone (RISPERDAL) 4 MG tablet Take 8 mg by mouth at bedtime.    Yes Historical Provider, MD  thiamine (VITAMIN B-1) 100 MG tablet Take 100 mg by mouth daily.   Yes Historical Provider, MD  traZODone (DESYREL) 50 MG tablet Take 50 mg by mouth at bedtime.    Yes Historical Provider, MD    Scheduled Meds: .  stroke: mapping our early stages of recovery book   Does not apply Once  . aspirin  325 mg Oral Daily  . atropine  1 drop Both Eyes Once  . divalproex  1,500 mg Oral QHS  . risperidone  8 mg Oral QHS  . sodium chloride  3 mL Intravenous Q12H   Continuous Infusions:  PRN Meds:.sodium chloride, acetaminophen, acetaminophen, sodium chloride   Blood pressure 128/77, pulse 80, temperature 97.3 F (36.3 C), temperature source Oral, resp. rate 20, height 5' 5"  (1.651 m), weight 50.621 kg (111 lb 9.6 oz), SpO2 96.00%.   Results for orders placed during the hospital encounter of 05/12/13 (from the past 48 hour(s))  CBC     Status: None   Collection Time    05/12/13  2:04 AM      Result Value Ref Range   WBC 4.8  4.0 - 10.5 K/uL   RBC 4.28  3.87 - 5.11 MIL/uL   Hemoglobin 13.8  12.0 - 15.0 g/dL   HCT 38.7  36.0 - 46.0 %   MCV 90.4  78.0 - 100.0 fL   MCH 32.2  26.0 - 34.0 pg   MCHC 35.7  30.0 - 36.0 g/dL   RDW 12.8  11.5 - 15.5 %   Platelets    150 - 400 K/uL   Value: PLATELET CLUMPS NOTED ON SMEAR, COUNT APPEARS ADEQUATE   Comment: SMEAR STAINED AND AVAILABLE FOR REVIEW  COMPREHENSIVE METABOLIC PANEL     Status: Abnormal   Collection Time    05/12/13  2:04 AM      Result Value Ref Range   Sodium 122 (*) 137 - 147 mEq/L   Potassium 3.8  3.7 - 5.3 mEq/L   Chloride 83 (*) 96 - 112 mEq/L   CO2 29  19 - 32 mEq/L   Glucose, Bld 99  70 - 99 mg/dL   BUN 6  6 - 23 mg/dL   Creatinine, Ser 0.51  0.50 - 1.10 mg/dL   Calcium 9.6  8.4 - 10.5 mg/dL   Total Protein 6.6  6.0 - 8.3 g/dL   Albumin 3.5  3.5 - 5.2 g/dL   AST 14  0 - 37 U/L   ALT 7  0 - 35 U/L   Alkaline  Phosphatase 68  39 - 117 U/L   Total Bilirubin 0.4  0.3 - 1.2 mg/dL   GFR calc non Af Amer >90  >90 mL/min   GFR calc Af Amer >90  >90 mL/min   Comment: (NOTE)     The eGFR has been calculated using the CKD EPI equation.     This calculation has not been validated in all clinical situations.     eGFR's persistently <90 mL/min signify possible Chronic Kidney     Disease.  ETHANOL     Status: None   Collection Time    05/12/13  2:04 AM      Result Value Ref Range   Alcohol, Ethyl (B) <11  0 - 11 mg/dL   Comment:            LOWEST DETECTABLE LIMIT FOR     SERUM ALCOHOL IS 11 mg/dL     FOR MEDICAL PURPOSES ONLY  ACETAMINOPHEN LEVEL     Status: None   Collection Time    05/12/13  2:04 AM      Result Value Ref Range   Acetaminophen (Tylenol), Serum <15.0  10 - 30 ug/mL   Comment:            THERAPEUTIC CONCENTRATIONS VARY     SIGNIFICANTLY. A RANGE OF 10-30     ug/mL MAY BE AN EFFECTIVE     CONCENTRATION FOR MANY PATIENTS.     HOWEVER, SOME ARE BEST TREATED     AT CONCENTRATIONS OUTSIDE THIS     RANGE.     ACETAMINOPHEN CONCENTRATIONS     >150 ug/mL AT 4 HOURS AFTER     INGESTION AND >50 ug/mL AT 12     HOURS AFTER INGESTION ARE     OFTEN ASSOCIATED WITH TOXIC     REACTIONS.  SALICYLATE LEVEL     Status: Abnormal   Collection Time    05/12/13  2:04 AM      Result Value Ref Range  Salicylate Lvl <1.6 (*) 2.8 - 20.0 mg/dL  URINE RAPID DRUG SCREEN (HOSP PERFORMED)     Status: None   Collection Time    05/12/13  2:10 AM      Result Value Ref Range   Opiates NONE DETECTED  NONE DETECTED   Cocaine NONE DETECTED  NONE DETECTED   Benzodiazepines NONE DETECTED  NONE DETECTED   Amphetamines NONE DETECTED  NONE DETECTED   Tetrahydrocannabinol NONE DETECTED  NONE DETECTED   Barbiturates NONE DETECTED  NONE DETECTED   Comment:            DRUG SCREEN FOR MEDICAL PURPOSES     ONLY.  IF CONFIRMATION IS NEEDED     FOR ANY PURPOSE, NOTIFY LAB     WITHIN 5 DAYS.                 LOWEST DETECTABLE LIMITS     FOR URINE DRUG SCREEN     Drug Class       Cutoff (ng/mL)     Amphetamine      1000     Barbiturate      200     Benzodiazepine   109     Tricyclics       604     Opiates          300     Cocaine          300     THC              50  URINALYSIS, ROUTINE W REFLEX MICROSCOPIC     Status: Abnormal   Collection Time    05/12/13  2:10 AM      Result Value Ref Range   Color, Urine YELLOW  YELLOW   APPearance CLEAR  CLEAR   Specific Gravity, Urine 1.010  1.005 - 1.030   pH 7.5  5.0 - 8.0   Glucose, UA NEGATIVE  NEGATIVE mg/dL   Hgb urine dipstick TRACE (*) NEGATIVE   Bilirubin Urine NEGATIVE  NEGATIVE   Ketones, ur NEGATIVE  NEGATIVE mg/dL   Protein, ur NEGATIVE  NEGATIVE mg/dL   Urobilinogen, UA 0.2  0.0 - 1.0 mg/dL   Nitrite NEGATIVE  NEGATIVE   Leukocytes, UA TRACE (*) NEGATIVE  URINE MICROSCOPIC-ADD ON     Status: Abnormal   Collection Time    05/12/13  2:10 AM      Result Value Ref Range   Squamous Epithelial / LPF MANY (*) RARE   WBC, UA 3-6  <3 WBC/hpf   RBC / HPF 0-2  <3 RBC/hpf   Bacteria, UA FEW (*) RARE  SODIUM, URINE, RANDOM     Status: None   Collection Time    05/12/13  2:10 AM      Result Value Ref Range   Sodium, Ur 104    OSMOLALITY, URINE     Status: Abnormal   Collection Time    05/12/13  2:10 AM      Result Value Ref Range   Osmolality, Ur 372 (*) 390 - 1090 mOsm/kg   Comment: Performed at [facility]  VALPROIC ACID LEVEL     Status: Abnormal   Collection Time    05/12/13  2:45 AM      Result Value Ref Range   Valproic Acid Lvl 102.8 (*) 50.0 - 100.0 ug/mL  CORTISOL     Status: None   Collection Time    05/12/13  6:15 AM  Result Value Ref Range   Cortisol, Plasma 7.8     Comment: (NOTE)     AM:  4.3 - 22.4 ug/dL     PM:  3.1 - 16.7 ug/dL     Performed at [facility]  TSH     Status: None   Collection Time    05/12/13  6:15 AM      Result Value Ref Range   TSH 0.763  0.350 - 4.500 uIU/mL    Comment: Please note change in reference range.     Performed at [facility]     Status: Abnormal   Collection Time    05/12/13  6:15 AM      Result Value Ref Range   Osmolality 257 (*) 275 - 300 mOsm/kg   Comment: Performed at [facility]     Status: Abnormal   Collection Time    05/12/13 12:57 PM      Result Value Ref Range   Sodium 127 (*) 137 - 147 mEq/L   Potassium 4.3  3.7 - 5.3 mEq/L   Chloride 88 (*) 96 - 112 mEq/L   CO2 30  19 - 32 mEq/L   Glucose, Bld 123 (*) 70 - 99 mg/dL   BUN 5 (*) 6 - 23 mg/dL   Creatinine, Ser 0.51  0.50 - 1.10 mg/dL   Calcium 9.1  8.4 - 10.5 mg/dL   GFR calc non Af Amer >90  >90 mL/min   GFR calc Af Amer >90  >90 mL/min   Comment: (NOTE)     The eGFR has been calculated using the CKD EPI equation.     This calculation has not been validated in all clinical situations.     eGFR's persistently <90 mL/min signify possible Chronic Kidney     Disease.  APTT     Status: None   Collection Time    05/13/13  6:43 AM      Result Value Ref Range   aPTT 30  24 - 37 seconds  PROTIME-INR     Status: None   Collection Time    05/13/13  6:43 AM      Result Value Ref Range   Prothrombin Time 13.7  11.6 - 15.2 seconds   INR 1.07  0.00 - 4.03  BASIC METABOLIC PANEL     Status: Abnormal   Collection Time    05/13/13  6:43 AM      Result Value Ref Range   Sodium 130 (*) 137 - 147 mEq/L   Potassium 3.9  3.7 - 5.3 mEq/L   Chloride 91 (*) 96 - 112 mEq/L   CO2 29  19 - 32 mEq/L   Glucose, Bld 93  70 - 99 mg/dL   BUN 8  6 - 23 mg/dL   Creatinine, Ser 0.56  0.50 - 1.10 mg/dL   Calcium 9.6  8.4 - 10.5 mg/dL   GFR calc non Af Amer >90  >90 mL/min   GFR calc Af Amer >90  >90 mL/min   Comment: (NOTE)     The eGFR has been calculated using the CKD EPI equation.     This calculation has not been validated in all clinical situations.     eGFR's persistently <90 mL/min signify possible Chronic Kidney      Disease.  LIPID PANEL     Status: Abnormal   Collection Time    05/13/13  6:44 AM  Result Value Ref Range   Cholesterol 202 (*) 0 - 200 mg/dL   Triglycerides 86  <150 mg/dL   HDL 113  >39 mg/dL   Total CHOL/HDL Ratio 1.8     VLDL 17  0 - 40 mg/dL   LDL Cholesterol 72  0 - 99 mg/dL   Comment:            Total Cholesterol/HDL:CHD Risk     Coronary Heart Disease Risk Table                         Men   Women      1/2 Average Risk   3.4   3.3      Average Risk       5.0   4.4      2 X Average Risk   9.6   7.1      3 X Average Risk  23.4   11.0                Use the calculated Patient Ratio     above and the CHD Risk Table     to determine the patient's CHD Risk.                ATP III CLASSIFICATION (LDL):      <100     mg/dL   Optimal      100-129  mg/dL   Near or Above                        Optimal      130-159  mg/dL   Borderline      160-189  mg/dL   High      >190     mg/dL   Very High    Mr Virgel Paling Wo Contrast  05/12/2013   CLINICAL DATA:  58 year old female with vision loss. Abnormal brain MRI earlier today. Initial encounter.  EXAM: MRA HEAD WITHOUT CONTRAST  TECHNIQUE: Angiographic images of the Circle of Willis were obtained using MRA technique without intravenous contrast.  COMPARISON:  Brain MRI 901 hr the same day.  FINDINGS: Antegrade flow in the posterior circulation with codominant distal vertebral arteries. Normal PICA origins. Normal vertebrobasilar junction. No basilar stenosis. Normal SCA and left PCA origins. Fetal right PCA origin. Left posterior communicating artery diminutive or absent. Bilateral PCA branches are within normal limits.  Antegrade flow in both ICA siphons. No ICA stenosis. Ophthalmic and right posterior communicating artery origins are normal. Normal carotid termini. Normal MCA and left ACA origins. Non dominant right A1 segment. Anterior communicating artery and visualized ACA branches are within normal limits. Visualized bilateral MCA  branches are within normal limits.  IMPRESSION: Negative intracranial MRA.   Electronically Signed   By: Lars Pinks M.D.   On: 05/12/2013 14:39   Mr Brain Wo Contrast  05/12/2013   CLINICAL DATA:  58 year old female with vision loss. Initial encounter.  EXAM: MRI HEAD WITHOUT CONTRAST  TECHNIQUE: Multiplanar, multiecho pulse sequences of the brain and surrounding structures were obtained without intravenous contrast.  COMPARISON:  None.  FINDINGS: Cerebral volume at the lower limits of normal for age.  Punctate focus of increased trace diffusion signal along the left posterior corona radiata (series 100, image 21). No definite correlation on ADC. No other increased trace diffusion signal identified. There is a chronic appearing linear infarct in the right superior cerebellum. Major intracranial vascular flow  voids are preserved. Patchy bilateral cerebral white matter T2 and FLAIR hyperintensity, greater in the right hemisphere. No no supratentorial cortical encephalomalacia.  Visualized orbit soft tissues are within normal limits. Visualized paranasal sinuses and mastoids are clear. Optic chiasm and non contrast appearance of the cavernous sinus are normal. Normal bone marrow signal. Visualized scalp soft tissues are within normal limits.  IMPRESSION: 1. Evidence of a punctate subacute left frontal lobe white matter lacunar infarct. No associated mass effect or hemorrhage. 2. Small chronic infarct in the right superior cerebellar artery territory. Moderate nonspecific white matter signal changes which may also relate to chronic small vessel disease. 3. No abnormality identified to explain sudden visual change.   Electronically Signed   By: Lars Pinks M.D.   On: 05/12/2013 09:42   US Carotid Bilateral  05/12/2013   CLINICAL DATA:  Stroke  EXAM: BILATERAL CAROTID DUPLEX ULTRASOUND  TECHNIQUE: Pearline Cables scale imaging, color Doppler and duplex ultrasound were performed of bilateral carotid and vertebral arteries in the  neck.  COMPARISON:  None.  FINDINGS: Criteria: Quantification of carotid stenosis is based on velocity parameters that correlate the residual internal carotid diameter with NASCET-based stenosis levels, using the diameter of the distal internal carotid lumen as the denominator for stenosis measurement.  The following velocity measurements were obtained:  RIGHT  ICA:  58 cm/sec  CCA:  44 cm/sec  SYSTOLIC ICA/CCA RATIO:  1.3  DIASTOLIC ICA/CCA RATIO:  ECA:  72 cm/sec  LEFT  ICA:  70 cm/sec  CCA:  45 cm/sec  SYSTOLIC ICA/CCA RATIO:  1.6  DIASTOLIC ICA/CCA RATIO:  ECA:  65 cm/sec  RIGHT CAROTID ARTERY: Minimal plaque in the bulb. Low resistance internal carotid Doppler pattern.  RIGHT VERTEBRAL ARTERY:  Antegrade.  Normal Doppler pattern.  LEFT CAROTID ARTERY: Little if any plaque in the bulb. Low resistance internal carotid Doppler pattern.  LEFT VERTEBRAL ARTERY:  Antegrade.  Normal Doppler pattern.  IMPRESSION: Less than 50% stenosis in the right and left internal carotid arteries.   Electronically Signed   By: Maryclare Bean M.D.   On: 05/12/2013 17:19        [place] A. Merlene Laughter, M.D.  Diplomate, Tax adviser of Psychiatry and Neurology ( Neurology). 05/13/2013, 9:44 AM

## 2013-05-13 NOTE — Progress Notes (Signed)
Patient requested to ambulate.  RN walked with patient around unit.  Tolerated well.  Returned to room.  Resting in bed with no complaints.

## 2013-05-13 NOTE — Progress Notes (Signed)
*  PRELIMINARY RESULTS* Echocardiogram 2D Echocardiogram has been performed.  Cassidy Smith 05/13/2013, 12:10 PM

## 2013-05-13 NOTE — Progress Notes (Addendum)
PROGRESS NOTE  Cassidy Smith GNF:[id number] DOB: Sep 04, 1955 DOA: 05/12/2013 PCP: No PCP Per Patient  Summary: 58 year old woman with history of anxiety, depression, bipolar 1 disorder presents to the emergency department after an argument with a coworker with vague and changing complaints of vision loss in the left, right or both eyes. History was noted to change by EDP and there was no evidence of acute abnormality. Manic episode with suspected and psychiatric evaluation was planned, however labs revealed hyponatremia and therefore the patient was referred for admission.  Assessment/Plan: 1. Hypoosmolar Hyponatremia. Acute on chronic. Acute component appears resolved. Appears to be a long-standing problem with hyponatremia noted 06/2009, 07/2009, 10/2010, 07/2012, 08/2012, 10/2012. Low chloride suggested component of dehydration which patient endorsed (poor oral intake). TSH, cortisol unremarkable. 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 2. Subacute left frontal lobe lacunar infarct. Significance unclear. Stroke workup unremarkable. Low dose aspirin recommended. 3. Suspected Parkinson's disease. Respiridal decreased from 8 to 4 mg. 4. Questionable of vision loss. Patient described floaters, has no signs or symptoms of vision loss. Visual testing is unremarkable. 5. Dehydration. Resolving with IV fluids. 6. Extensive white matter chronic lesions of unclear significance. Followup with neurology in 6 weeks recommended. Consideration will be given to outpatient spinal tap, cervical MRI. 7. Decompensated bipolar disorder with suspected acute mania maintained on Risperdal, Depakote, trazodone as an outpatient. 8. Anxiety, depression 9. Chronic bilateral hand tremor   Overall improved today, acute hyponatremia appears to have resolved, appears to be at baseline, chart review indicates long history of hyponatremia.   Neurologically intact to stroke workup unremarkable. Case  discussed with Dr. Merlene Laughter. The significance of tiny left frontal parietal lacunar infarct is unclear. He recommends low-dose aspirin and outpatient followup in 6 weeks to consider further evaluation of extensive white matter chronic lesions.  Mania also improved.   Patient medically clearing, psychiatry requests attempt to improve sodium further. Plan to psychiatry consultation 5/1 for further recommendations, may need inpatient treatment. Patient agrees.  Code Status: full code DVT prophylaxis: SCDs (early ambulation) Family Communication: none present Disposition Plan: pending further evaluation  Murray Hodgkins, MD  [facility]  Pager [phone] If 7PM-7AM, please contact night-coverage at [email or web address], password [facility] 05/13/2013, 5:05 PM  LOS: 1 day   Consultants:  Neurology  Physical therapy. No needs.  Speech therapy. No needs.  Procedures:  2-D echocardiogram. LVEF 60-65%. Normal wall motion.  Antibiotics:    HPI/Subjective: "I feel better." No complaints today. She feels well. No visual problems. Vision intact. No paresthesias or focal neurologic complaints.  Objective: Filed Vitals:   05/12/13 1848 05/12/13 2125 05/13/13 0645 05/13/13 1457  BP:  138/63 128/77 136/72  Pulse:  76 80 79  Temp:  98.2 F (36.8 C) 97.3 F (36.3 C) 98 F (36.7 C)  TempSrc:  Oral Oral   Resp:  18 20 18   Height:      Weight:   50.621 kg (111 lb 9.6 oz)   SpO2: 98% 97% 96% 98%    Intake/Output Summary (Last 24 hours) at 05/13/13 1705 Last data filed at 05/13/13 0600  Gross per 24 hour  Intake      0 ml  Output   1401 ml  Net  -1401 ml     Filed Weights   05/12/13 0203 05/12/13 0742 05/13/13 0645  Weight: 54.432 kg (120 lb) 54.432 kg (120 lb) 50.621 kg (111 lb 9.6 oz)    Exam:   Afebrile, vital signs are stable.   Gen. Appears  calm and comfortable. Speech fluent and clear.  Cardiovascular. Regular rate and rhythm. No murmur, rub or gallop.  Respiratory.  Clear to auscultation bilaterally. No wheezes, rales or rhonchi. Normal respiratory effort.  Neurologic. Grossly nonfocal, no change from yesterday. Ambulating in hall without difficulty.  Musculoskeletal: Grossly intact. No changes from yesterday.  Psychiatric. Thought more organized today.  Data Reviewed:  Sodium 122 >>  >> 130, chloride 83  >> 91.   Thyroid function normal. Random cortisol normal.   Carotid ultrasound unremarkable. MRA head unremarkable.  2-D echocardiogram noted  Scheduled Meds: .  stroke: mapping our early stages of recovery book   Does not apply Once  . aspirin  325 mg Oral Daily  . atropine  1 drop Both Eyes Once  . divalproex  1,500 mg Oral QHS  . risperidone  4 mg Oral QHS  . sodium chloride  3 mL Intravenous Q12H   Continuous Infusions:    Principal Problem:   Hyponatremia Active Problems:   Bipolar disorder   Lacunar infarct, acute   Time spent 25 minutes

## 2013-05-13 NOTE — Evaluation (Signed)
Clinical/Bedside Swallow Evaluation Patient Details  Name: Cassidy Smith MRN: [id number] Date of Birth: [birth year]  Today's Date: 05/13/2013 Time: 1430-1500 SLP Time Calculation (min): 30 min  Past Medical History:  Past Medical History  Diagnosis Date  . Bipolar 1 disorder   . Anxiety   . Depression   . Headache(784.0)   . Neuromuscular disorder     shaking of hands   . Chronic kidney disease     uti  currently,  hx bladder spasms   Past Surgical History:  Past Surgical History  Procedure Laterality Date  . No past surgeries    . Multiple extractions with alveoloplasty N/A 10/30/2012    Procedure: MULTIPLE EXTRACION 5, 6, 8, 9, 10 ,18, 19, 31 WITH MAXILLARY RIGHT AND LEFT  ALVEOLOPLASTY REDUCE MAXILLARY LEFT TUBEROSITY;  Surgeon: Gae Bon, DDS;  Location: [facility];  Service: Oral Surgery;  Laterality: N/A;   HPI:  58 year old female admitted 05/12/13 due to floaters and hyponatremia. PMH significant for bipolar, tremors.    Assessment / Plan / Recommendation Clinical Impression  Pt exhibits normal oral motor strength and function. No overt s/s aspiration with any consistency.     Aspiration Risk  Mild    Diet Recommendation Thin liquid;Regular   Liquid Administration via: Cup;Straw Medication Administration: Whole meds with liquid Supervision: Patient able to self feed Compensations: Slow rate;Small sips/bites Postural Changes and/or Swallow Maneuvers: Seated upright 90 degrees    Other  Recommendations Oral Care Recommendations: Oral care BID   Follow Up Recommendations  None    Frequency and Duration        Pertinent Vitals/Pain VSS, no pain reported    SLP Swallow Goals  n/a   Swallow Study Prior Functional Status  Type of Home: House Available Help at Discharge: Family;Available 24 hours/day    General Date of Onset: 05/12/13 HPI: 58 year old female admitted 05/12/13 due to floaters and hyponatremia. PMH significant for bipolar, tremors.  Type  of Study: Bedside swallow evaluation Previous Swallow Assessment: n/a Diet Prior to this Study: Regular;Thin liquids Temperature Spikes Noted: No Respiratory Status: Room air History of Recent Intubation: No Behavior/Cognition: Alert;Cooperative;Pleasant mood Oral Cavity - Dentition:  (upper dentures removed. Lower teeth present) Self-Feeding Abilities: Able to feed self Patient Positioning:  (seated at EOB) Baseline Vocal Quality: Clear Volitional Cough: Strong Volitional Swallow: Able to elicit    Oral/Motor/Sensory Function Overall Oral Motor/Sensory Function: Appears within functional limits for tasks assessed   Ice Chips Ice chips: Not tested   Thin Liquid Thin Liquid: Within functional limits Presentation: Straw    Nectar Thick Nectar Thick Liquid: Not tested   Honey Thick Honey Thick Liquid: Not tested   Puree Puree: Within functional limits Presentation: Self Fed;Spoon   Solid   GO   Celia B. Bueche, MSP, CCC-SLP  Solid: Within functional limits Presentation: [place] 05/13/2013,4:12 PM

## 2013-05-13 NOTE — Progress Notes (Signed)
1730 - Tele-psych appointment scheduled for 1700.  Computer in room.  RN notified [place] that patient was ready for consult.  Stated that they would notify the assessor.

## 2013-05-13 NOTE — Progress Notes (Signed)
Late entry 1005:  NT in patient's room.  Patient stated to NT that "I need a gun."  NT asked patient why she needed a gun.  Patient stated that she needed to "take care of the demon."  NT notified RN.  RN went to patient's room.  Patient denies needing a gun.  Denies any suicidal ideations.  States that she "saw a demon out of the left corner of her eye yesterday."  Patient stated that she was "just kidding when she mentioned the gun."  Dr. Sarajane Jews on unit and notified by RN.  Dr. Sarajane Jews to see patient.  Stated will order a tele-psych consult.

## 2013-05-14 ENCOUNTER — Observation Stay (HOSPITAL_COMMUNITY)
Admission: AD | Admit: 2013-05-14 | Discharge: 2013-05-15 | Disposition: A | Discharge status: Home / Self Care | Payer: MEDICAID | Source: Intra-hospital | Attending: Psychiatry | Admitting: Psychiatry

## 2013-05-14 ENCOUNTER — Encounter (HOSPITAL_COMMUNITY): Payer: Self-pay | Admitting: *Deleted

## 2013-05-14 DIAGNOSIS — Z8659 Personal history of other mental and behavioral disorders: Secondary | ICD-10-CM

## 2013-05-14 DIAGNOSIS — R9082 White matter disease, unspecified: Secondary | ICD-10-CM

## 2013-05-14 DIAGNOSIS — F319 Bipolar disorder, unspecified: Principal | ICD-10-CM | POA: Diagnosis present

## 2013-05-14 DIAGNOSIS — I6381 Other cerebral infarction due to occlusion or stenosis of small artery: Secondary | ICD-10-CM

## 2013-05-14 DIAGNOSIS — E871 Hypo-osmolality and hyponatremia: Secondary | ICD-10-CM

## 2013-05-14 DIAGNOSIS — R930 Abnormal findings on diagnostic imaging of skull and head, not elsewhere classified: Secondary | ICD-10-CM

## 2013-05-14 DIAGNOSIS — F3110 Bipolar disorder, current episode manic without psychotic features, unspecified: Secondary | ICD-10-CM | POA: Clinically undetermined

## 2013-05-14 LAB — BASIC METABOLIC PANEL
BUN: 7 mg/dL (ref 6–23)
CHLORIDE: 93 meq/L — AB (ref 96–112)
CO2: 30 meq/L (ref 19–32)
Calcium: 9.5 mg/dL (ref 8.4–10.5)
Creatinine, Ser: 0.53 mg/dL (ref 0.50–1.10)
GFR calc Af Amer: >90 mL/min (ref >90)
GFR calc non Af Amer: >90 mL/min (ref >90)
GLUCOSE: 83 mg/dL (ref 70–99)
POTASSIUM: 4.8 meq/L (ref 3.7–5.3)
SODIUM: 130 meq/L — AB (ref 137–147)

## 2013-05-14 LAB — ANA: Anti Nuclear Antibody(ANA): NEGATIVE

## 2013-05-14 MED ORDER — RISPERIDONE 2 MG PO TABS
4.0000 mg | ORAL_TABLET | Freq: Every day | ORAL | Status: DC
  Administered 2013-05-14: 4 mg via ORAL
  Filled 2013-05-14 (×3): qty 2

## 2013-05-14 MED ORDER — SIMVASTATIN 20 MG PO TABS
20.0000 mg | ORAL_TABLET | Freq: Every day | ORAL | Status: DC
  Administered 2013-05-15: 20 mg via ORAL
  Filled 2013-05-14 (×3): qty 1

## 2013-05-14 MED ORDER — DIVALPROEX SODIUM ER 500 MG PO TB24
1500.0000 mg | ORAL_TABLET | Freq: Every day | ORAL | Status: DC
  Administered 2013-05-14: 1500 mg via ORAL
  Filled 2013-05-14 (×3): qty 3

## 2013-05-14 MED ORDER — RISPERIDONE 4 MG PO TABS: 4.0000 mg | ORAL_TABLET | Freq: Every day | ORAL | Status: DC

## 2013-05-14 MED ORDER — VITAMIN B-1 100 MG PO TABS
100.0000 mg | ORAL_TABLET | Freq: Every day | ORAL | Status: DC
  Filled 2013-05-14 (×2): qty 1

## 2013-05-14 MED ORDER — SIMVASTATIN 20 MG PO TABS: 20.0000 mg | ORAL_TABLET | Freq: Every day | ORAL | Status: DC

## 2013-05-14 MED ORDER — ASPIRIN EC 81 MG PO TBEC
81.0000 mg | DELAYED_RELEASE_TABLET | Freq: Every day | ORAL | Status: DC
  Administered 2013-05-15: 81 mg via ORAL
  Filled 2013-05-14 (×3): qty 1

## 2013-05-14 MED ORDER — ASPIRIN 81 MG PO TBEC: 81.0000 mg | DELAYED_RELEASE_TABLET | Freq: Every day | ORAL | Status: DC

## 2013-05-14 MED ORDER — ASPIRIN EC 81 MG PO TBEC: 81.0000 mg | DELAYED_RELEASE_TABLET | Freq: Every day | ORAL | Status: DC

## 2013-05-14 NOTE — Progress Notes (Signed)
[facility] INPATIENT:  Family/Significant Other Suicide Prevention Education  Suicide Prevention Education:  Contact Attempts:  Lorrin Goodell has been identified by the patient as the family member/significant other with whom the patient will be residing, and identified as the person(s) who will aid the patient in the event of a mental health crisis.  With written consent from the patient, two attempts were made to provide suicide prevention education, prior to and/or following the patient's discharge.  We were unsuccessful in providing suicide prevention education.  A suicide education pamphlet was given to the patient to share with family/significant other. Voicemail full unable to leave message.  Date and time of first attempt:2120 05/14/13 Date and time of second attempt:2140 05/14/13  Apolinar Junes 05/14/2013, 9:44 PM

## 2013-05-14 NOTE — BH Assessment (Signed)
[facility] Assessment Progress Note  Consulted with Letitia Libra, [facility], at [facility], who stated pt could be transported to [facility] Obs Bed 2 since pt medically cleared per Lelon Frohlich, Education officer, museum at [employer].  Pt accepted by Darlyne Russian, PA to Dr. Sabra Heck.  Called pt's nurse, Anderson Malta @ 1520 to inform her of pt acceptance to [facility].  Attempted to call to get consent paperwork completed by the nurse and no answer at [phone].  Pt's nurse given number [phone]) to call pt report at [facility] and to be transported to [facility].  Letitia Libra, [facility], and TTS staff updated.  Shaune Pascal, MS, [facility] Licensed Professional Counselor Triage Specialist

## 2013-05-14 NOTE — Clinical Social Work Note (Signed)
Per MD, patient is medically clear.  CSW notified Kirsten at [facility], who said she will have [facility] call 300 nurses station when bed is available, patient going to [facility] obs unit.  Edwyna Shell, LCSW Clinical Social Worker [phone])

## 2013-05-14 NOTE — Progress Notes (Signed)
Patient ID: Cassidy Smith, female   DOB: 02-02-1955, 58 y.o.   MRN: [id number]  [place] A. Merlene Laughter, MD     [email or web address]          Cassidy Smith is an 58 y.o. female.   Assessment/Plan:  1. Tiny left frontal parietal lacunar infarct. It's unclear if this is actually symptomatic. I doubt that the tiny lacunar infarct has essentially cause any symptoms in this patient. Risk factors only age. Currently no history of hypertension or diabetes. 81 mg aspirin and should suffice for secondary stroke prevention. Avoid tobacco products.  2. Asymmetric rest tremor most consistent with Parkinson disease. Undoubtedly, the neuroleptic respirdal is likely aggravating this problem. We will reduce the dose from 8 mg to 4 mg a day.  3. Extensive white matter chronic lesions which are concerning for MS although clinically she doesn't appear to have MS. Additional labs will be obtained for homocysteine, ANA, sedimentation rate, HIV and vitamin B12 level. We also may consider doing a spinal tap in the outpatient setting. Alternatively, repeating the MRI in about 6-12 months is recommended. Cervical spine MRI will also be considered at a later date.    The patient believes that her tremors has Improved and I agree with this. No flows are reported.  GENERAL: This is a pleasant thin lady in no acute distress.  HEENT: Supple. Atraumatic normocephalic.  EXTREMITIES: No edema   SKIN: Normal by inspection.  MENTAL STATUS: Alert and oriented. Speech, language and cognition are generally Unremarkable. Judgment and insight Seems limited.Marland Kitchen  MOTOR: She has left upper extremity weakness. Deltoid 4+/5. Distal strength in the left upper extremity is normal. The other extremities shows normal tone, bulk and strength.  COORDINATION: Left finger to nose is normal, right finger to nose is normal, Tremor now more interupted than continuous - moderate amplitude moderate frequency pin rolling tremor  involving the right upper extremity. Occasionally she may have some tremor involving the left upper extremity but this is a quite infrequent. The character is somewhat lower amplitude higher frequency. There are no postural tremors are action tremors. I appreciated no cogwheeling even with augmentation. She does not have evidence of bradykinesia and has good hand dexterity.      Objective: Vital signs in last 24 hours: Temp:  [97.6 F (36.4 C)-98.3 F (36.8 C)] 97.7 F (36.5 C) (05/01 1502) Pulse Rate:  [63-72] 69 (05/01 1502) Resp:  [18-20] 20 (05/01 1502) BP: (150-152)/(75-84) 150/83 mmHg (05/01 1502) SpO2:  [96 %-98 %] 97 % (05/01 1502) Weight:  [51.166 kg (112 lb 12.8 oz)] 51.166 kg (112 lb 12.8 oz) (05/01 0551)  Intake/Output from previous day: 04/30 0701 - 05/01 0700 In: 856.7 [P.O.:840; I.V.:16.7] Out: 900 [Urine:900] Intake/Output this shift: Total I/O In: 360 [P.O.:360] Out: 300 [Urine:300] Nutritional status: Cardiac   Lab Results: Results for orders placed during the hospital encounter of 05/12/13 (from the past 48 hour(s))  APTT     Status: None   Collection Time    05/13/13  6:43 AM      Result Value Ref Range   aPTT 30  24 - 37 seconds  PROTIME-INR     Status: None   Collection Time    05/13/13  6:43 AM      Result Value Ref Range   Prothrombin Time 13.7  11.6 - 15.2 seconds   INR 1.07  0.00 - 1.49  HEMOGLOBIN A1C     Status: None   Collection Time  05/13/13  6:43 AM      Result Value Ref Range   Hemoglobin A1C 5.4  <5.7 %   Comment: (NOTE)                                                                               According to the ADA Clinical Practice Recommendations for 2011, when     HbA1c is used as a screening test:      >=6.5%   Diagnostic of Diabetes Mellitus               (if abnormal result is confirmed)     5.7-6.4%   Increased risk of developing Diabetes Mellitus     References:Diagnosis and Classification of Diabetes  Mellitus,Diabetes     SJGG,8366,29(UTMLY 1):S62-S69 and Standards of Medical Care in             Diabetes - 2011,Diabetes Care,2011,34 (Suppl 1):S11-S61.   Mean Plasma Glucose 108  <117 mg/dL   Comment: Performed at [place]     Status: Abnormal   Collection Time    05/13/13  6:43 AM      Result Value Ref Range   Sodium 130 (*) 137 - 147 mEq/L   Potassium 3.9  3.7 - 5.3 mEq/L   Chloride 91 (*) 96 - 112 mEq/L   CO2 29  19 - 32 mEq/L   Glucose, Bld 93  70 - 99 mg/dL   BUN 8  6 - 23 mg/dL   Creatinine, Ser 0.56  0.50 - 1.10 mg/dL   Calcium 9.6  8.4 - 10.5 mg/dL   GFR calc non Af Amer >90  >90 mL/min   GFR calc Af Amer >90  >90 mL/min   Comment: (NOTE)     The eGFR has been calculated using the CKD EPI equation.     This calculation has not been validated in all clinical situations.     eGFR's persistently <90 mL/min signify possible Chronic Kidney     Disease.  LIPID PANEL     Status: Abnormal   Collection Time    05/13/13  6:44 AM      Result Value Ref Range   Cholesterol 202 (*) 0 - 200 mg/dL   Triglycerides 86  <150 mg/dL   HDL 113  >39 mg/dL   Total CHOL/HDL Ratio 1.8     VLDL 17  0 - 40 mg/dL   LDL Cholesterol 72  0 - 99 mg/dL   Comment:            Total Cholesterol/HDL:CHD Risk     Coronary Heart Disease Risk Table                         Men   Women      1/2 Average Risk   3.4   3.3      Average Risk       5.0   4.4      2 X Average Risk   9.6   7.1      3 X Average Risk  23.4   11.0  Use the calculated Patient Ratio     above and the CHD Risk Table     to determine the patient's CHD Risk.                ATP III CLASSIFICATION (LDL):      <100     mg/dL   Optimal      100-129  mg/dL   Near or Above                        Optimal      130-159  mg/dL   Borderline      160-189  mg/dL   High      >190     mg/dL   Very High  VITAMIN B12     Status: Abnormal   Collection Time    05/13/13 11:08 AM      Result Value  Ref Range   Vitamin B-12 1505 (*) 211 - 911 pg/mL   Comment: Performed at [facility]  RPR     Status: None   Collection Time    05/13/13 11:08 AM      Result Value Ref Range   RPR NON REAC  NON REAC   Comment: Performed at [facility]     Status: None   Collection Time    05/13/13 11:08 AM      Result Value Ref Range   Homocysteine 8.4  4.0 - 15.4 umol/L   Comment: Performed at [place]     Status: None   Collection Time    05/13/13 11:08 AM      Result Value Ref Range   Sed Rate 2  0 - 22 mm/hr  ANA     Status: None   Collection Time    05/13/13 11:08 AM      Result Value Ref Range   ANA NEGATIVE  NEGATIVE   Comment: Performed at [place] [facility]     Status: None   Collection Time    05/13/13 11:08 AM      Result Value Ref Range   Total CK 66  7 - 177 U/L   CK, MB 2.2  0.3 - 4.0 ng/mL   Relative Index RELATIVE INDEX IS INVALID  0.0 - 2.5   Comment: WHEN CK < 100 U/L                Performed at [facility]  HIV ANTIBODY (ROUTINE TESTING)     Status: None   Collection Time    05/13/13 11:08 AM      Result Value Ref Range   HIV 1&2 Ab, 4th Generation NONREACTIVE  NONREACTIVE   Comment: (NOTE)     A NONREACTIVE HIV Ag/Ab result does not exclude HIV infection since     the time frame for seroconversion is variable. If acute HIV infection     is suspected, a HIV-1 RNA Qualitative TMA test is recommended.     HIV-1/2 Antibody Diff         Not indicated.     HIV-1 RNA, Qual TMA           Not indicated.     PLEASE NOTE: This information has been disclosed to you from records     whose confidentiality may be protected by state law. If your state     requires such protection, then the state  law prohibits you from making     any further disclosure of the information without the specific written     consent of the person to whom it pertains, or as otherwise permitted     by law. A  general authorization for the release of medical or other     information is NOT sufficient for this purpose.     The performance of this assay has not been clinically validated in     patients less than 75 years old.     Performed at [place]     Status: Abnormal   Collection Time    05/14/13  8:55 AM      Result Value Ref Range   Sodium 130 (*) 137 - 147 mEq/L   Potassium 4.8  3.7 - 5.3 mEq/L   Comment: DELTA CHECK NOTED   Chloride 93 (*) 96 - 112 mEq/L   CO2 30  19 - 32 mEq/L   Glucose, Bld 83  70 - 99 mg/dL   BUN 7  6 - 23 mg/dL   Creatinine, Ser 0.53  0.50 - 1.10 mg/dL   Calcium 9.5  8.4 - 10.5 mg/dL   GFR calc non Af Amer >90  >90 mL/min   GFR calc Af Amer >90  >90 mL/min   Comment: (NOTE)     The eGFR has been calculated using the CKD EPI equation.     This calculation has not been validated in all clinical situations.     eGFR's persistently <90 mL/min signify possible Chronic Kidney     Disease.    Lipid Panel  Recent Labs  05/13/13 0644  CHOL 202*  TRIG 86  HDL 113  CHOLHDL 1.8  VLDL 17  LDLCALC 72    Studies/Results: No results found.  Medications:  Scheduled Meds: .  stroke: mapping our early stages of recovery book   Does not apply Once  . [START ON 05/15/2013] aspirin EC  81 mg Oral Daily  . atropine  1 drop Both Eyes Once  . divalproex  1,500 mg Oral QHS  . risperidone  4 mg Oral QHS   Continuous Infusions:  PRN Meds:.acetaminophen, acetaminophen     LOS: 2 days   Hiroyuki Ozanich A. Merlene Laughter, M.D.  Diplomate, Tax adviser of Psychiatry and Neurology ( Neurology).

## 2013-05-14 NOTE — Progress Notes (Signed)
[facility] called for telepsych consult. They will call when clinician is ready

## 2013-05-14 NOTE — Consult Note (Signed)
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 She was admitted to the medical floor from the ED.She underwent electrolyte therapy with improvement of Na+ from 122 to 130 which is normal for her according records;MRI and MRA-results as noted and Neurology Consult with recommendation for ASA and FU 6 weeks. Carotid US was less than 50% occlusion bilaterally  Assessment: AXIS I:  Bipolar I DO ?manic;Hx of depression with anxiety AXIS II:  Deferred AXIS III:   Past Medical History  Diagnosis Date  . Bipolar 1 disorder   . Anxiety   . Depression   . Headache(784.0)   . Neuromuscular disorder     shaking of hands   . Chronic kidney disease     uti  currently,  hx bladder spasms   AXIS IV:  occupational problems and problems with primary support group AXIS V:  41-50 serious symptoms  Plan:  Recommend admission to Observation Unit on D/C from hospital bed/medically clear  Subjective:   Cassidy Smith is a 58 y.o. female patient admitted with complaint of her brother of decompensation of her Bipolar I disorder after she got into argument with co-worker 4/30.In ED she c/o visual disturbances cw hallucinations and ideas of reference feeling her brother was monitoring her thru her TV.Labs revealed pt had a sodium of 122.? Of CVA was entertained.See above for details.Marland Kitchen  HPI:  See above HPI Elements:   Location:  AP Bed 327 -1 Telepysch. Quality:  Pt c/o disturbed  sensorium ( "head feels heavy").Brother concerned about pt mental status and her ability to perform normal ADLs. Severity:  Problem required medical hospitalization/stabilization and psychiatric obserrvation after medically cleared. Timing:  Problem is continuous with intermittent exacerbations. ROS-no change from ED ROS 4/30 Past Psychiatric History: Past Medical History  Diagnosis Date  . Bipolar 1 disorder   . Anxiety   . Depression   . Headache(784.0)   . Neuromuscular disorder     shaking of hands   . Chronic kidney disease     uti  currently,  hx bladder spasms    reports that she has been smoking Cigarettes.  She has a 20 pack-year smoking history. She does not have any smokeless tobacco history on file. She reports that she does not drink alcohol or use illicit drugs. Family History  Problem Relation Age of Onset  . Breast cancer Mother   . Alcoholism Father      Living Arrangements: Other relatives   Allergies:  No Known Allergies  ACT Assessment Complete:  No:   Past Psychiatric History: Diagnosis:  Bipolar I;Hx of Depression with anxiety  Hospitalizations:  [facility] July and December 2011  Outpatient Care:  ? Not recorded  Substance Abuse Care:  NA  Self-Mutilation:  NA  Suicidal Attempts:  No  Homicidal Behaviors:  No   Violent Behaviors:  No   Place of Residence:  [place] area Marital Status:  Divorced Employed/Unemployed:  Employed Education:  HS Family Supports:  Yes/Brother Objective: Blood pressure 152/84, pulse 63, temperature 97.6 F (36.4 C), temperature  source Oral, resp. rate 18, height _0  (1.651 m), weight 51.166 kg (112 lb 12.8 oz), SpO2 96.00%.Body mass index is 18.77 kg/(m^2). Results for orders placed during the hospital encounter of 05/12/13 (from the past 72 hour(s))  CBC     Status: None   Collection Time    05/12/13  2:04 AM      Result Value Ref Range   WBC 4.8  4.0 - 10.5 K/uL   RBC 4.28  3.87 - 5.11 MIL/uL   Hemoglobin  13.8  12.0 - 15.0 g/dL   HCT 38.7  36.0 - 46.0 %   MCV 90.4  78.0 - 100.0 fL   MCH 32.2  26.0 - 34.0 pg   MCHC 35.7  30.0 - 36.0 g/dL   RDW 12.8  11.5 - 15.5 %   Platelets    150 - 400 K/uL   Value: PLATELET CLUMPS NOTED ON SMEAR, COUNT APPEARS ADEQUATE   Comment: SMEAR STAINED AND AVAILABLE FOR REVIEW  COMPREHENSIVE METABOLIC PANEL     Status: Abnormal   Collection Time    05/12/13  2:04 AM      Result Value Ref Range   Sodium 122 (*) 137 - 147 mEq/L   Potassium 3.8  3.7 - 5.3 mEq/L   Chloride 83 (*) 96 - 112 mEq/L   CO2 29  19 - 32 mEq/L   Glucose, Bld 99  70 - 99 mg/dL   BUN 6  6 - 23 mg/dL   Creatinine, Ser 0.51  0.50 - 1.10 mg/dL   Calcium 9.6  8.4 - 10.5 mg/dL   Total Protein 6.6  6.0 - 8.3 g/dL   Albumin 3.5  3.5 - 5.2 g/dL   AST 14  0 - 37 U/L   ALT 7  0 - 35 U/L   Alkaline Phosphatase 68  39 - 117 U/L   Total Bilirubin 0.4  0.3 - 1.2 mg/dL   GFR calc non Af Amer >90  >90 mL/min   GFR calc Af Amer >90  >90 mL/min   Comment: (NOTE)     The eGFR has been calculated using the CKD EPI equation.     This calculation has not been validated in all clinical situations.     eGFR's persistently <90 mL/min signify possible Chronic Kidney     Disease.  ETHANOL     Status: None   Collection Time    05/12/13  2:04 AM      Result Value Ref Range   Alcohol, Ethyl (B) <11  0 - 11 mg/dL   Comment:            LOWEST DETECTABLE LIMIT FOR     SERUM ALCOHOL IS 11 mg/dL     FOR MEDICAL PURPOSES ONLY  ACETAMINOPHEN LEVEL     Status: None   Collection Time    05/12/13  2:04 AM      Result Value Ref Range   Acetaminophen (Tylenol), Serum <15.0  10 - 30 ug/mL   Comment:            THERAPEUTIC CONCENTRATIONS VARY     SIGNIFICANTLY. A RANGE OF 10-30     ug/mL MAY BE AN EFFECTIVE     CONCENTRATION FOR MANY PATIENTS.     HOWEVER, SOME ARE BEST TREATED     AT CONCENTRATIONS OUTSIDE THIS     RANGE.     ACETAMINOPHEN CONCENTRATIONS     >150 ug/mL AT 4 HOURS AFTER  INGESTION AND >50  ug/mL AT 12     HOURS AFTER INGESTION ARE     OFTEN ASSOCIATED WITH TOXIC     REACTIONS.  SALICYLATE LEVEL     Status: Abnormal   Collection Time    05/12/13  2:04 AM      Result Value Ref Range   Salicylate Lvl <5.7 (*) 2.8 - 20.0 mg/dL  URINE RAPID DRUG SCREEN (HOSP PERFORMED)     Status: None   Collection Time    05/12/13  2:10 AM      Result Value Ref Range   Opiates NONE DETECTED  NONE DETECTED   Cocaine NONE DETECTED  NONE DETECTED   Benzodiazepines NONE DETECTED  NONE DETECTED   Amphetamines NONE DETECTED  NONE DETECTED   Tetrahydrocannabinol NONE DETECTED  NONE DETECTED   Barbiturates NONE DETECTED  NONE DETECTED   Comment:            DRUG SCREEN FOR MEDICAL PURPOSES     ONLY.  IF CONFIRMATION IS NEEDED     FOR ANY PURPOSE, NOTIFY LAB     WITHIN 5 DAYS.                LOWEST DETECTABLE LIMITS     FOR URINE DRUG SCREEN     Drug Class       Cutoff (ng/mL)     Amphetamine      1000     Barbiturate      200     Benzodiazepine   903     Tricyclics       833     Opiates          300     Cocaine          300     THC              50  URINALYSIS, ROUTINE W REFLEX MICROSCOPIC     Status: Abnormal   Collection Time    05/12/13  2:10 AM      Result Value Ref Range   Color, Urine YELLOW  YELLOW   APPearance CLEAR  CLEAR   Specific Gravity, Urine 1.010  1.005 - 1.030   pH 7.5  5.0 - 8.0   Glucose, UA NEGATIVE  NEGATIVE mg/dL   Hgb urine dipstick TRACE (*) NEGATIVE   Bilirubin Urine NEGATIVE  NEGATIVE   Ketones, ur NEGATIVE  NEGATIVE mg/dL   Protein, ur NEGATIVE  NEGATIVE mg/dL   Urobilinogen, UA 0.2  0.0 - 1.0 mg/dL   Nitrite NEGATIVE  NEGATIVE   Leukocytes, UA TRACE (*) NEGATIVE  URINE MICROSCOPIC-ADD ON     Status: Abnormal   Collection Time    05/12/13  2:10 AM      Result Value Ref Range   Squamous Epithelial / LPF MANY (*) RARE   WBC, UA 3-6  <3 WBC/hpf   RBC / HPF 0-2  <3 RBC/hpf   Bacteria, UA FEW (*) RARE  SODIUM, URINE, RANDOM     Status: None    Collection Time    05/12/13  2:10 AM      Result Value Ref Range   Sodium, Ur 104    OSMOLALITY, URINE     Status: Abnormal   Collection Time    05/12/13  2:10 AM      Result Value Ref Range   Osmolality, Ur 372 (*) 390 - 1090 mOsm/kg   Comment: Performed at [facility]  VALPROIC ACID  LEVEL     Status: Abnormal   Collection Time    05/12/13  2:45 AM      Result Value Ref Range   Valproic Acid Lvl 102.8 (*) 50.0 - 100.0 ug/mL  CORTISOL     Status: None   Collection Time    05/12/13  6:15 AM      Result Value Ref Range   Cortisol, Plasma 7.8     Comment: (NOTE)     AM:  4.3 - 22.4 ug/dL     PM:  3.1 - 16.7 ug/dL     Performed at [facility]  TSH     Status: None   Collection Time    05/12/13  6:15 AM      Result Value Ref Range   TSH 0.763  0.350 - 4.500 uIU/mL   Comment: Please note change in reference range.     Performed at [place] [facility]     Status: Abnormal   Collection Time    05/12/13  6:15 AM      Result Value Ref Range   Osmolality 257 (*) 275 - 300 mOsm/kg   Comment: Performed at [place]     Status: Abnormal   Collection Time    05/12/13 12:57 PM      Result Value Ref Range   Sodium 127 (*) 137 - 147 mEq/L   Potassium 4.3  3.7 - 5.3 mEq/L   Chloride 88 (*) 96 - 112 mEq/L   CO2 30  19 - 32 mEq/L   Glucose, Bld 123 (*) 70 - 99 mg/dL   BUN 5 (*) 6 - 23 mg/dL   Creatinine, Ser 0.51  0.50 - 1.10 mg/dL   Calcium 9.1  8.4 - 10.5 mg/dL   GFR calc non Af Amer >90  >90 mL/min   GFR calc Af Amer >90  >90 mL/min   Comment: (NOTE)     The eGFR has been calculated using the CKD EPI equation.     This calculation has not been validated in all clinical situations.     eGFR's persistently <90 mL/min signify possible Chronic Kidney     Disease.  APTT     Status: None   Collection Time    05/13/13  6:43 AM      Result Value Ref Range   aPTT 30  24 - 37 seconds  PROTIME-INR     Status: None    Collection Time    05/13/13  6:43 AM      Result Value Ref Range   Prothrombin Time 13.7  11.6 - 15.2 seconds   INR 1.07  0.00 - 1.49  HEMOGLOBIN A1C     Status: None   Collection Time    05/13/13  6:43 AM      Result Value Ref Range   Hemoglobin A1C 5.4  <5.7 %   Comment: (NOTE)                                                                               According to the ADA Clinical Practice Recommendations for 2011, when     HbA1c is used as  a screening test:      >=6.5%   Diagnostic of Diabetes Mellitus               (if abnormal result is confirmed)     5.7-6.4%   Increased risk of developing Diabetes Mellitus     References:Diagnosis and Classification of Diabetes Mellitus,Diabetes     FEOF,1219,75(OITGP 1):S62-S69 and Standards of Medical Care in             Diabetes - 2011,Diabetes QDIY,6415,83 (Suppl 1):S11-S61.   Mean Plasma Glucose 108  <117 mg/dL   Comment: Performed at [place]     Status: Abnormal   Collection Time    05/13/13  6:43 AM      Result Value Ref Range   Sodium 130 (*) 137 - 147 mEq/L   Potassium 3.9  3.7 - 5.3 mEq/L   Chloride 91 (*) 96 - 112 mEq/L   CO2 29  19 - 32 mEq/L   Glucose, Bld 93  70 - 99 mg/dL   BUN 8  6 - 23 mg/dL   Creatinine, Ser 0.56  0.50 - 1.10 mg/dL   Calcium 9.6  8.4 - 10.5 mg/dL   GFR calc non Af Amer >90  >90 mL/min   GFR calc Af Amer >90  >90 mL/min   Comment: (NOTE)     The eGFR has been calculated using the CKD EPI equation.     This calculation has not been validated in all clinical situations.     eGFR's persistently <90 mL/min signify possible Chronic Kidney     Disease.  LIPID PANEL     Status: Abnormal   Collection Time    05/13/13  6:44 AM      Result Value Ref Range   Cholesterol 202 (*) 0 - 200 mg/dL   Triglycerides 86  <150 mg/dL   HDL 113  >39 mg/dL   Total CHOL/HDL Ratio 1.8     VLDL 17  0 - 40 mg/dL   LDL Cholesterol 72  0 - 99 mg/dL   Comment:            Total  Cholesterol/HDL:CHD Risk     Coronary Heart Disease Risk Table                         Men   Women      1/2 Average Risk   3.4   3.3      Average Risk       5.0   4.4      2 X Average Risk   9.6   7.1      3 X Average Risk  23.4   11.0                Use the calculated Patient Ratio     above and the CHD Risk Table     to determine the patient's CHD Risk.                ATP III CLASSIFICATION (LDL):      <100     mg/dL   Optimal      100-129  mg/dL   Near or Above                        Optimal      130-159  mg/dL   Borderline      160-189  mg/dL   High      >190     mg/dL   Very High  VITAMIN B12     Status: Abnormal   Collection Time    05/13/13 11:08 AM      Result Value Ref Range   Vitamin B-12 1505 (*) 211 - 911 pg/mL   Comment: Performed at [facility]  RPR     Status: None   Collection Time    05/13/13 11:08 AM      Result Value Ref Range   RPR NON REAC  NON REAC   Comment: Performed at [place]     Status: None   Collection Time    05/13/13 11:08 AM      Result Value Ref Range   Homocysteine 8.4  4.0 - 15.4 umol/L   Comment: Performed at [place]     Status: None   Collection Time    05/13/13 11:08 AM      Result Value Ref Range   Sed Rate 2  0 - 22 mm/hr  ANA     Status: None   Collection Time    05/13/13 11:08 AM      Result Value Ref Range   ANA NEGATIVE  NEGATIVE   Comment: Performed at [facility]     Status: None   Collection Time    05/13/13 11:08 AM      Result Value Ref Range   Total CK 66  7 - 177 U/L   CK, MB 2.2  0.3 - 4.0 ng/mL   Relative Index RELATIVE INDEX IS INVALID  0.0 - 2.5   Comment: WHEN CK < 100 U/L                Performed at [facility]  HIV ANTIBODY (ROUTINE TESTING)     Status: None   Collection Time    05/13/13 11:08 AM      Result Value Ref Range   HIV 1&2 Ab, 4th Generation NONREACTIVE  NONREACTIVE   Comment: (NOTE)      A NONREACTIVE HIV Ag/Ab result does not exclude HIV infection since     the time frame for seroconversion is variable. If acute HIV infection     is suspected, a HIV-1 RNA Qualitative TMA test is recommended.     HIV-1/2 Antibody Diff         Not indicated.     HIV-1 RNA, Qual TMA           Not indicated.     PLEASE NOTE: This information has been disclosed to you from records     whose confidentiality may be protected by state law. If your state     requires such protection, then the state law prohibits you from making     any further disclosure of the information without the specific written     consent of the person to whom it pertains, or as otherwise permitted     by law. A general authorization for the release of medical or other     information is NOT sufficient for this purpose.     The performance of this assay has not been clinically validated in     patients less than 58 years old.     Performed at [facility]     Status: Abnormal   Collection Time  05/14/13  8:55 AM      Result Value Ref Range   Sodium 130 (*) 137 - 147 mEq/L   Potassium 4.8  3.7 - 5.3 mEq/L   Comment: DELTA CHECK NOTED   Chloride 93 (*) 96 - 112 mEq/L   CO2 30  19 - 32 mEq/L   Glucose, Bld 83  70 - 99 mg/dL   BUN 7  6 - 23 mg/dL   Creatinine, Ser 0.53  0.50 - 1.10 mg/dL   Calcium 9.5  8.4 - 10.5 mg/dL   GFR calc non Af Amer >90  >90 mL/min   GFR calc Af Amer >90  >90 mL/min   Comment: (NOTE)     The eGFR has been calculated using the CKD EPI equation.     This calculation has not been validated in all clinical situations.     eGFR's persistently <90 mL/min signify possible Chronic Kidney     Disease.   Labs are reviewed and are pertinent for Hyponatremia as noted.  Current Facility-Administered Medications  Medication Dose Route Frequency Provider Last Rate Last Dose  .  stroke: mapping our early stages of recovery book   Does not apply Once Samuella Cota,  MD      . 0.9 %  sodium chloride infusion  250 mL Intravenous PRN Jani Gravel, MD      . 0.9 %  sodium chloride infusion   Intravenous Continuous Samuella Cota, MD 50 mL/hr at 05/13/13 1740    . acetaminophen (TYLENOL) tablet 650 mg  650 mg Oral Q6H PRN Jani Gravel, MD   650 mg at 05/14/13 0451   Or  . acetaminophen (TYLENOL) suppository 650 mg  650 mg Rectal Q6H PRN Jani Gravel, MD      . aspirin tablet 325 mg  325 mg Oral Daily Samuella Cota, MD   325 mg at 05/14/13 6010  . atropine 1 % ophthalmic solution 1 drop  1 drop Both Eyes Once Johnna Acosta, MD      . divalproex (DEPAKOTE ER) 24 hr tablet 1,500 mg  1,500 mg Oral QHS Samuella Cota, MD   1,500 mg at 05/13/13 2142  . risperiDONE (RISPERDAL) tablet 4 mg  4 mg Oral QHS Phillips Odor, MD   4 mg at 05/13/13 2143  . sodium chloride 0.9 % injection 3 mL  3 mL Intravenous Q12H Jani Gravel, MD   3 mL at 05/14/13 9323  . sodium chloride 0.9 % injection 3 mL  3 mL Intravenous PRN Jani Gravel, MD        Psychiatric Specialty Exam:     Blood pressure 152/84, pulse 63, temperature 97.6 F (36.4 C), temperature source Oral, resp. rate 18, height _0  (1.651 m), weight 51.166 kg (112 lb 12.8 oz), SpO2 96.00%.Body mass index is 18.77 kg/(m^2).  General Appearance: Fairly Groomed  Engineer, water::  Fair  Speech: Varied Clear and Coherent, Garbled and Slurred  Volume:  Normal  Mood:  Happy  Affect:  Congruent  Thought Process:  Loose and Tangential  Orientation:  Negative  Thought Content:  Hallucinations: Visual and Ideas of Reference:   Paranoia  Suicidal Thoughts:  No  Homicidal Thoughts:  No  Memory:  Immediate;   Poor  Judgement:  Poor  Insight:  Lacking  Psychomotor Activity:  Negative  Concentration:  Fair  Recall:  Fair  Akathisia:  NA  Handed:  Right  AIMS (if indicated):   NA  Assets:  Financial Resources/Insurance  Social Support  Sleep:   Disturbed   Treatment Plan Summary: Move to [facility] Observation Unit when  stable/clear  Disposition:  [facility] Obs for further assessment when medically cleared  Dara Hoyer 05/14/2013 1:30 PM

## 2013-05-14 NOTE — Care Management Note (Signed)
    Page 1 of 1   05/14/2013     2:38:43 PM CARE MANAGEMENT NOTE 05/14/2013  Patient:  Cassidy Smith, Cassidy Smith   Account Number:  [id number]  Date Initiated:  05/14/2013  Documentation initiated by:  Claretha Cooper  Subjective/Objective Assessment:   Pt lives at home with her brother. Was evaluated by Telepsych who recommends [facility] when medically stable.     Action/Plan:   Anticipated DC Date:     Anticipated DC Plan:  [place] referral  Clinical Social Worker      DC Planning Services  CM consult      Choice offered to / List presented to:             Status of service:  Completed, signed off Medicare Important Message given?   (If response is "NO", the following Medicare IM given date fields will be blank) Date Medicare IM given:   Date Additional Medicare IM given:    Discharge Disposition:    Per UR Regulation:    If discussed at Long Length of Stay Meetings, dates discussed:    Comments:  05/14/13 Claretha Cooper RN BSN CM

## 2013-05-14 NOTE — Progress Notes (Signed)
OT Cancellation Note  Patient Details Name: Cassidy Smith MRN: [id number] DOB: 24-Jan-1955   Cancelled Treatment:    Reason Eval/Treat Not Completed: OT screened, no needs identified, will sign off.  Pt demonstrates WFL strength and ROM with BUE.  She was independent with ADLs prior to admission and currently has no concerns about being at baseline at home.  No acute OT needs at this time.  Bea Graff, [place], OTR/L [phone]  05/14/2013, 9:10 AM

## 2013-05-14 NOTE — Progress Notes (Signed)
Unable to make appointment with Dr. Merlene Laughter. Physicians office closes half a day on Friday.

## 2013-05-14 NOTE — Progress Notes (Signed)
PROGRESS NOTE  Cassidy Smith HAL:[id number] DOB: May 10, 1955 DOA: 05/12/2013 PCP: No PCP Per Patient  Summary: 58 year old woman with history of anxiety, depression, bipolar 1 disorder presents to the emergency department after an argument with a coworker with vague and changing complaints of vision loss in the left, right or both eyes. History was noted to change by EDP and there was no evidence of acute abnormality. Manic episode with suspected and psychiatric evaluation was planned, however labs revealed hyponatremia and therefore the patient was referred for admission.  Assessment/Plan: 1. Hypoosmolar Hyponatremia. Appears to be at baseline. Acute component appears resolved, likely secondary to voluminous fluid intake. Appears to be a long-standing problem with hyponatremia noted 06/2009, 07/2009, 10/2010, 07/2012, 08/2012, 10/2012. TSH, cortisol unremarkable. 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 2. Subacute left frontal lobe lacunar infarct. Significance unclear. Stroke workup unremarkable. Low dose aspirin recommended. Case discussed with Dr. Merlene Laughter. The significance of tiny left frontal parietal lacunar infarct is unclear. He recommended low-dose aspirin and outpatient followup in 6 weeks to consider further evaluation of extensive white matter chronic lesions. LDL 72. 3. Suspected Parkinson's disease. Respiridal decreased from 8 to 4 mg. 4. Questionable of vision loss. Patient described floaters, has no signs or symptoms of vision loss. Visual testing  unremarkable. 5. Dehydration. Resolved with IV fluids. 6. Extensive white matter chronic lesions of unclear significance. Followup with neurology in 6 weeks recommended. Consideration will be given to outpatient spinal tap, cervical MRI. 7. Decompensated bipolar disorder with  acute mania maintained on Risperdal, Depakote, trazodone as an outpatient. 8. Anxiety, depression 9. Chronic bilateral hand  tremor   Sodium appears to be at baseline, stable. Chloride near normal. Patient was instructed to limit free water intake.  Neurologically at baseline with no focal deficits. Psychiatry has recommended further treatment at behavioral [place] observation unit.  Patient medically clear.  She should followup closely with outpatient physician with monitoring of her sodium levels.  Code Status: full code DVT prophylaxis: SCDs (early ambulation) Family Communication: none present Disposition Plan: pending further evaluation  Murray Hodgkins, MD  [facility]  Pager [phone] If 7PM-7AM, please contact night-coverage at [email or web address], password [facility] 05/14/2013, 3:47 PM  LOS: 2 days   Consultants:  Neurology  Physical therapy. No needs.  Speech therapy. No needs.  Procedures:  2-D echocardiogram. LVEF 60-65%. Normal wall motion.  Antibiotics:    HPI/Subjective: "I feel great, I'm ready to go home". No complaints. No visual complaints. No neurologic complaints. No suicidal ideation.  Nursing noted the patient was having hallucinations yesterday.  Objective: Filed Vitals:   05/13/13 1946 05/14/13 0445 05/14/13 0551 05/14/13 1502  BP: 150/75 152/84  150/83  Pulse: 72 63  69  Temp: 98.3 F (36.8 C) 97.6 F (36.4 C)  97.7 F (36.5 C)  TempSrc: Oral Oral  Oral  Resp: 20 18  20   Height:      Weight:   51.166 kg (112 lb 12.8 oz)   SpO2: 98% 96%  97%    Intake/Output Summary (Last 24 hours) at 05/14/13 1547 Last data filed at 05/14/13 1427  Gross per 24 hour  Intake 616.67 ml  Output    100 ml  Net 516.67 ml     Filed Weights   05/12/13 0742 05/13/13 0645 05/14/13 0551  Weight: 54.432 kg (120 lb) 50.621 kg (111 lb 9.6 oz) 51.166 kg (112 lb 12.8 oz)    Exam:   Afebrile, vital signs are stable.   Gen. Appears calm  and comfortable.  Psychiatric. Grossly normal mood and affect. Speech more appropriate today.  Cardiovascular. Regular rate and  rhythm. No murmur, rub or gallop. No lower extremity edema.  Respiratory. Clear to auscultation bilaterally. No wheezes, rales or rhonchi. Normal respiratory effort.  Neurologic. Grossly normal exam with no changes.   Data Reviewed:  Sodium 130, stable  Scheduled Meds: .  stroke: mapping our early stages of recovery book   Does not apply Once  . aspirin  325 mg Oral Daily  . atropine  1 drop Both Eyes Once  . divalproex  1,500 mg Oral QHS  . risperidone  4 mg Oral QHS  . sodium chloride  3 mL Intravenous Q12H   Continuous Infusions: . sodium chloride 50 mL/hr at 05/13/13 1740    Principal Problem:   Hyponatremia Active Problems:   Bipolar disorder   Lacunar infarct, acute   White matter abnormality on MRI of brain   History of depression   Hx of anxiety disorder   Bipolar I disorder, most recent episode (or current) manic, unspecified

## 2013-05-14 NOTE — Progress Notes (Signed)
Patient presents attentive, calm and cooperative, denies any thoughts of self harm or thoughts to harm others, denies AVH, patient states that she is her because the doctor wants her medications evaluated because of the shaking in her hands. She states that she does not understand why she has to stay over night. NAD

## 2013-05-14 NOTE — Discharge Summary (Signed)
Physician Discharge Summary  Cassidy Smith JKD:[id number] DOB: [birth year] DOA: 05/12/2013  PCP: No PCP Per Patient  Admit date: 05/12/2013 Discharge date: 05/14/2013  Recommendations for Outpatient Follow-up:  1. Chronic hyponatremia, likely medication induced. Suggest periodic BMP. 2. Subacute left frontal lobe lacunar infarct of unclear etiology. Started on aspirin. 3. Extensive white matter lesions, chronic, of unclear significance. 4. Possible Parkinson's disease. 5. Bipolar disorder  6. Establish with primary care physician  Follow-up Information   Follow up with [facility], KOFI, MD. Schedule an appointment as soon as possible for a visit in 6 weeks.   Specialty:  Neurology   Contact information:   [address]  [place] [address] [phone]      Discharge Diagnoses:  1. Hypoosmolar hyponatremia 2. Chronic hyponatremia 3. Subacute left frontal lobe lacunar infarct 4. Suspected Parkinson's disease 5. Extensive white matter chronic lesions of unclear significance 6. Decompensated bipolar disorder with acute mania  Discharge Condition: Improved Disposition: Transfer to [facility]  Diet recommendation: regular (low-fat when at home)  [facility]   05/12/13 0742 05/13/13 0645 05/14/13 0551  Weight: 54.432 kg (120 lb) 50.621 kg (111 lb 9.6 oz) 51.166 kg (112 lb 12.8 oz)    History of present illness:  58 year old woman with history of anxiety, depression, bipolar 1 disorder presents to the emergency department after an argument with a coworker with vague and changing complaints of vision loss in the left, right or both eyes. History was noted to change by EDP and there was no evidence of acute abnormality. Manic episode with suspected and psychiatric evaluation was planned, however labs revealed hyponatremia and therefore the patient was referred for admission.  Hospital Course:  Patient was treated with IV fluids with appropriate improvement in her sodium  levels back to baseline. 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 No visual complaints resolved, no focal neurologic deficits were noted. MRI did reveal subacute infarct, patient seen by neurology with recommendations as below. At this point the patient has no neurologic deficits, no visual complaints and her sodium is at baseline. She was evaluated by psychiatry and transferred to [facility] was recommended.  1. Hypoosmolar Hyponatremia. Appears to be at baseline. Acute component appears resolved, likely secondary to voluminous fluid intake. Appears to be a long-standing problem with hyponatremia noted 06/2009, 07/2009, 10/2010, 07/2012, 08/2012, 10/2012. TSH, cortisol unremarkable. 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 2. Subacute left frontal lobe lacunar infarct. Significance unclear. Stroke workup unremarkable. Low dose aspirin recommended. Case discussed with Dr. Merlene Laughter. The significance of tiny left frontal parietal lacunar infarct is unclear. He recommended low-dose aspirin and outpatient followup in 6 weeks to consider further evaluation of extensive white matter chronic lesions. LDL 72. 3. Suspected Parkinson's disease. Respiridal decreased from 8 to 4 mg. 4. Questionable of vision loss. Patient described floaters, has no signs or symptoms of vision loss. Visual testing unremarkable. 5. Extensive white matter chronic lesions of unclear significance. Followup with neurology in 6 weeks recommended. Consideration will be given to outpatient spinal tap, cervical MRI. 6. Decompensated bipolar disorder with acute mania maintained on Risperdal, Depakote, trazodone as an outpatient. 7. Anxiety, depression 8. Chronic bilateral hand tremor Sodium appears to be at baseline, stable. Chloride near normal. Patient was instructed to limit free water intake.  Neurologically at baseline with no focal deficits. Psychiatry has recommended further  treatment at behavioral [place] observation unit.  She should followup closely with outpatient physician with monitoring  of her sodium levels. Care was discussed with her brother [name] and bedside with patient's permission  Consultants:  Neurology  Psychiatry: [facility] recommended Physical therapy. No needs.  Speech therapy. No needs. Procedures:  2-D echocardiogram. LVEF 60-65%. Normal wall motion.  Discharge Instructions     Medication List    STOP taking these medications       traZODone 50 MG tablet  Commonly known as:  DESYREL      TAKE these medications       aspirin 81 MG EC tablet  Take 1 tablet (81 mg total) by mouth daily.  Start taking on:  05/15/2013     divalproex 500 MG 24 hr tablet  Commonly known as:  DEPAKOTE ER  Take 1,500 mg by mouth at bedtime.     risperidone 4 MG tablet  Commonly known as:  RISPERDAL  Take 1 tablet (4 mg total) by mouth at bedtime.     simvastatin 20 MG tablet  Commonly known as:  ZOCOR  Take 1 tablet (20 mg total) by mouth daily.     thiamine 100 MG tablet  Commonly known as:  VITAMIN B-1  Take 100 mg by mouth daily.     VITAMIN B 12 PO  Take 1 tablet by mouth daily.       No Known Allergies The results of significant diagnostics from this hospitalization (including imaging, microbiology, ancillary and laboratory) are listed below for reference.    Significant Diagnostic Studies: Mr Virgel Paling Wo Contrast  05/12/2013   CLINICAL DATA:  58 year old female with vision loss. Abnormal brain MRI earlier today. Initial encounter.  EXAM: MRA HEAD WITHOUT CONTRAST  TECHNIQUE: Angiographic images of the Circle of Willis were obtained using MRA technique without intravenous contrast.  COMPARISON:  Brain MRI 901 hr the same day.  FINDINGS: Antegrade flow in the posterior circulation with codominant distal vertebral arteries. Normal PICA origins. Normal vertebrobasilar junction. No basilar stenosis. Normal SCA and left PCA origins.  Fetal right PCA origin. Left posterior communicating artery diminutive or absent. Bilateral PCA branches are within normal limits.  Antegrade flow in both ICA siphons. No ICA stenosis. Ophthalmic and right posterior communicating artery origins are normal. Normal carotid termini. Normal MCA and left ACA origins. Non dominant right A1 segment. Anterior communicating artery and visualized ACA branches are within normal limits. Visualized bilateral MCA branches are within normal limits.  IMPRESSION: Negative intracranial MRA.   Electronically Signed   By: Lars Pinks M.D.   On: 05/12/2013 14:39   Mr Brain Wo Contrast  05/12/2013   CLINICAL DATA:  58 year old female with vision loss. Initial encounter.  EXAM: MRI HEAD WITHOUT CONTRAST  TECHNIQUE: Multiplanar, multiecho pulse sequences of the brain and surrounding structures were obtained without intravenous contrast.  COMPARISON:  None.  FINDINGS: Cerebral volume at the lower limits of normal for age.  Punctate focus of increased trace diffusion signal along the left posterior corona radiata (series 100, image 21). No definite correlation on ADC. No other increased trace diffusion signal identified. There is a chronic appearing linear infarct in the right superior cerebellum. Major intracranial vascular flow voids are preserved. Patchy bilateral cerebral white matter T2 and FLAIR hyperintensity, greater in the right hemisphere. No no supratentorial cortical encephalomalacia.  Visualized orbit soft tissues are within normal limits. Visualized paranasal sinuses and mastoids are clear. Optic chiasm and non contrast appearance of the cavernous sinus are normal. Normal bone marrow signal. Visualized scalp soft tissues are within normal limits.  IMPRESSION:  1. Evidence of a punctate subacute left frontal lobe white matter lacunar infarct. No associated mass effect or hemorrhage. 2. Small chronic infarct in the right superior cerebellar artery territory. Moderate nonspecific  white matter signal changes which may also relate to chronic small vessel disease. 3. No abnormality identified to explain sudden visual change.   Electronically Signed   By: Lars Pinks M.D.   On: 05/12/2013 09:42   US Carotid Bilateral  05/12/2013   CLINICAL DATA:  Stroke  EXAM: BILATERAL CAROTID DUPLEX ULTRASOUND  TECHNIQUE: Pearline Cables scale imaging, color Doppler and duplex ultrasound were performed of bilateral carotid and vertebral arteries in the neck.  COMPARISON:  None.  FINDINGS: Criteria: Quantification of carotid stenosis is based on velocity parameters that correlate the residual internal carotid diameter with NASCET-based stenosis levels, using the diameter of the distal internal carotid lumen as the denominator for stenosis measurement.  The following velocity measurements were obtained:  RIGHT  ICA:  58 cm/sec  CCA:  44 cm/sec  SYSTOLIC ICA/CCA RATIO:  1.3  DIASTOLIC ICA/CCA RATIO:  ECA:  72 cm/sec  LEFT  ICA:  70 cm/sec  CCA:  45 cm/sec  SYSTOLIC ICA/CCA RATIO:  1.6  DIASTOLIC ICA/CCA RATIO:  ECA:  65 cm/sec  RIGHT CAROTID ARTERY: Minimal plaque in the bulb. Low resistance internal carotid Doppler pattern.  RIGHT VERTEBRAL ARTERY:  Antegrade.  Normal Doppler pattern.  LEFT CAROTID ARTERY: Little if any plaque in the bulb. Low resistance internal carotid Doppler pattern.  LEFT VERTEBRAL ARTERY:  Antegrade.  Normal Doppler pattern.  IMPRESSION: Less than 50% stenosis in the right and left internal carotid arteries.   Electronically Signed   By: Maryclare Bean M.D.   On: 05/12/2013 17:19   Labs: Basic Metabolic Panel:  Recent Labs Lab 05/12/13 0204 05/12/13 1257 05/13/13 0643 05/14/13 0855  NA 122* 127* 130* 130*  K 3.8 4.3 3.9 4.8  CL 83* 88* 91* 93*  CO2 29 30 29 30   GLUCOSE 99 123* 93 83  BUN 6 5* 8 7  CREATININE 0.51 0.51 0.56 0.53  CALCIUM 9.6 9.1 9.6 9.5   Liver Function Tests:  Recent Labs Lab 05/12/13 0204  AST 14  ALT 7  ALKPHOS 68  BILITOT 0.4  PROT 6.6  ALBUMIN 3.5    CBC:  Recent Labs Lab 05/12/13 0204  WBC 4.8  HGB 13.8  HCT 38.7  MCV 90.4  PLT PLATELET CLUMPS NOTED ON SMEAR, COUNT APPEARS ADEQUATE   Cardiac Enzymes:  Recent Labs Lab 05/13/13 1108  CKTOTAL 66  CKMB 2.2    Principal Problem:   Hyponatremia Active Problems:   Bipolar disorder   Lacunar infarct, acute   White matter abnormality on MRI of brain   History of depression   Hx of anxiety disorder   Bipolar I disorder, most recent episode (or current) manic, unspecified   Time coordinating discharge: 40 minutes  Signed:  Murray Hodgkins, MD [facility] 05/14/2013, 4:02 PM

## 2013-05-14 NOTE — H&P (Signed)
[facility] Face-to-Face Psychiatry Consult Observation Unit Admission  Reason for Consult:  Observation Referring Physician:   Dr. Sarajane Jews, [facility] Cassidy Smith is an 58 y.o. female. Total Time spent with patient: 15 minutes  Assessment: AXIS I:  Bipolar 1 Disorder AXIS II:  Deferred AXIS III:   Past Medical History  Diagnosis Date  . Bipolar 1 disorder   . Anxiety   . Depression   . Headache(784.0)   . Neuromuscular disorder     shaking of hands   . Chronic kidney disease     uti  currently,  hx bladder spasms   AXIS IV:  other psychosocial or environmental problems and problems with access to health care services AXIS V:  51-60 moderate symptoms  Plan:  Overnight observation  Subjective:   Cassidy Smith is a 58 y.o. female patient who presented to [place] on 05/12/13 c/o having an argument with a co-worker, having vision loss and unable to maintain thought processes to stay on one subject. Patient was found to be hyponatremic and was admitted to the hospital. Patient lives with her brother and he brought her to the ED for evaluation due to concern for decompensation of her Bipolar 1 disorder. In the ED she was c/o visual disturbances and feeling like her brother was monitoring her thru the TV.  She was seen in the hospital by Neurology for a subacute left frontal lobe lacunar infarct and suspected Parkinson's disease. Neurology decreased her dose of Risperdal from 8 mg to 4 mg daily due to aggravation of her tremor.   Currently, patient denies SI, HI or AVH. She was transferred from [place] to [facility] for psychiatric observation.  HPI: 58 yo female sent from [place] for psychiatric observation HPI Elements:  Location:  Mood. Quality:  Visual disturbances; brother concerned about patient's mental status and her ability to perform ADLs. Severity:  Required medical hospitalization/stabilization. Timing:  Ongoing with intermittent exacerbations.  Constitutional: Calm, cooperative and  in no acute distress.  Head: Normocephalic, atraumatic Eyes: Pupils PERRL, EOM intact.  Cardiovascular: Regular rate and rhythm with a normal S1 and S2. No gallops, rubs or murmurs. No PMI, no JVD. No pulse deficits. Respiratory: Lungs have equal breath sounds bilaterally. No rales, rhonchi, wheezes auscultated. No increased work of breathing, no retractions or nasal flaring. Skin: Warm and dry with normal turgor. Normal color. Ecchymosis noted to right hand.   MS/Extremity: Pulses equal, no cyanosis. Neurovascular intact. Full, normal range of motion.  Neuro: Oriented to person, place, and situation. Mentation: able to follow commands. Motor: moves all fours. Right grip slightly < left. Sensation: no obvious gross deficits.    Past Psychiatric History: Past Medical History  Diagnosis Date  . Bipolar 1 disorder   . Anxiety   . Depression   . Headache(784.0)   . Neuromuscular disorder     shaking of hands   . Chronic kidney disease     uti  currently,  hx bladder spasms    reports that she has been smoking Cigarettes.  She has a 20 pack-year smoking history. She does not have any smokeless tobacco history on file. She reports that she does not drink alcohol or use illicit drugs. Family History  Problem Relation Age of Onset  . Breast cancer Mother   . Alcoholism Father      Living Arrangements: Other relatives (brother)   Abuse/Neglect [facility]) Physical Abuse: Denies Verbal Abuse: Denies Sexual Abuse: Denies Allergies:  No Known Allergies  ACT Assessment Complete:  Yes:    Educational Status    Risk to Self: Risk to self Is patient at risk for suicide?: No Substance abuse history and/or treatment for substance abuse?: No  Risk to Others:    Abuse: Abuse/Neglect Assessment (Assessment to be complete while patient is alone) Physical Abuse: Denies Verbal Abuse: Denies Sexual Abuse: Denies Exploitation of patient/patient's resources: Denies Self-Neglect: Denies  Prior  Inpatient Therapy:    Prior Outpatient Therapy:    Additional Information:      Objective: Blood pressure 153/91, pulse 69, temperature 96.7 F (35.9 C), temperature source Oral, resp. rate 18, height _0  (1.651 m), weight 50.349 kg (111 lb).Body mass index is 18.47 kg/(m^2). Results for orders placed during the hospital encounter of 05/12/13 (from the past 72 hour(s))  CBC     Status: None   Collection Time    05/12/13  2:04 AM      Result Value Ref Range   WBC 4.8  4.0 - 10.5 K/uL   RBC 4.28  3.87 - 5.11 MIL/uL   Hemoglobin 13.8  12.0 - 15.0 g/dL   HCT 38.7  36.0 - 46.0 %   MCV 90.4  78.0 - 100.0 fL   MCH 32.2  26.0 - 34.0 pg   MCHC 35.7  30.0 - 36.0 g/dL   RDW 12.8  11.5 - 15.5 %   Platelets    150 - 400 K/uL   Value: PLATELET CLUMPS NOTED ON SMEAR, COUNT APPEARS ADEQUATE   Comment: SMEAR STAINED AND AVAILABLE FOR REVIEW  COMPREHENSIVE METABOLIC PANEL     Status: Abnormal   Collection Time    05/12/13  2:04 AM      Result Value Ref Range   Sodium 122 (*) 137 - 147 mEq/L   Potassium 3.8  3.7 - 5.3 mEq/L   Chloride 83 (*) 96 - 112 mEq/L   CO2 29  19 - 32 mEq/L   Glucose, Bld 99  70 - 99 mg/dL   BUN 6  6 - 23 mg/dL   Creatinine, Ser 0.51  0.50 - 1.10 mg/dL   Calcium 9.6  8.4 - 10.5 mg/dL   Total Protein 6.6  6.0 - 8.3 g/dL   Albumin 3.5  3.5 - 5.2 g/dL   AST 14  0 - 37 U/L   ALT 7  0 - 35 U/L   Alkaline Phosphatase 68  39 - 117 U/L   Total Bilirubin 0.4  0.3 - 1.2 mg/dL   GFR calc non Af Amer >90  >90 mL/min   GFR calc Af Amer >90  >90 mL/min   Comment: (NOTE)     The eGFR has been calculated using the CKD EPI equation.     This calculation has not been validated in all clinical situations.     eGFR's persistently <90 mL/min signify possible Chronic Kidney     Disease.  ETHANOL     Status: None   Collection Time    05/12/13  2:04 AM      Result Value Ref Range   Alcohol, Ethyl (B) <11  0 - 11 mg/dL   Comment:            LOWEST DETECTABLE LIMIT FOR     SERUM  ALCOHOL IS 11 mg/dL     FOR MEDICAL PURPOSES ONLY  ACETAMINOPHEN LEVEL     Status: None   Collection Time    05/12/13  2:04 AM      Result Value Ref Range  Acetaminophen (Tylenol), Serum <15.0  10 - 30 ug/mL   Comment:            THERAPEUTIC CONCENTRATIONS VARY     SIGNIFICANTLY. A RANGE OF 10-30     ug/mL MAY BE AN EFFECTIVE     CONCENTRATION FOR MANY PATIENTS.     HOWEVER, SOME ARE BEST TREATED     AT CONCENTRATIONS OUTSIDE THIS     RANGE.     ACETAMINOPHEN CONCENTRATIONS     >150 ug/mL AT 4 HOURS AFTER     INGESTION AND >50 ug/mL AT 12     HOURS AFTER INGESTION ARE     OFTEN ASSOCIATED WITH TOXIC     REACTIONS.  SALICYLATE LEVEL     Status: Abnormal   Collection Time    05/12/13  2:04 AM      Result Value Ref Range   Salicylate Lvl <3.5 (*) 2.8 - 20.0 mg/dL  URINE RAPID DRUG SCREEN (HOSP PERFORMED)     Status: None   Collection Time    05/12/13  2:10 AM      Result Value Ref Range   Opiates NONE DETECTED  NONE DETECTED   Cocaine NONE DETECTED  NONE DETECTED   Benzodiazepines NONE DETECTED  NONE DETECTED   Amphetamines NONE DETECTED  NONE DETECTED   Tetrahydrocannabinol NONE DETECTED  NONE DETECTED   Barbiturates NONE DETECTED  NONE DETECTED   Comment:            DRUG SCREEN FOR MEDICAL PURPOSES     ONLY.  IF CONFIRMATION IS NEEDED     FOR ANY PURPOSE, NOTIFY LAB     WITHIN 5 DAYS.                LOWEST DETECTABLE LIMITS     FOR URINE DRUG SCREEN     Drug Class       Cutoff (ng/mL)     Amphetamine      1000     Barbiturate      200     Benzodiazepine   248     Tricyclics       185     Opiates          300     Cocaine          300     THC              50  URINALYSIS, ROUTINE W REFLEX MICROSCOPIC     Status: Abnormal   Collection Time    05/12/13  2:10 AM      Result Value Ref Range   Color, Urine YELLOW  YELLOW   APPearance CLEAR  CLEAR   Specific Gravity, Urine 1.010  1.005 - 1.030   pH 7.5  5.0 - 8.0   Glucose, UA NEGATIVE  NEGATIVE mg/dL   Hgb urine  dipstick TRACE (*) NEGATIVE   Bilirubin Urine NEGATIVE  NEGATIVE   Ketones, ur NEGATIVE  NEGATIVE mg/dL   Protein, ur NEGATIVE  NEGATIVE mg/dL   Urobilinogen, UA 0.2  0.0 - 1.0 mg/dL   Nitrite NEGATIVE  NEGATIVE   Leukocytes, UA TRACE (*) NEGATIVE  URINE MICROSCOPIC-ADD ON     Status: Abnormal   Collection Time    05/12/13  2:10 AM      Result Value Ref Range   Squamous Epithelial / LPF MANY (*) RARE   WBC, UA 3-6  <3 WBC/hpf   RBC / HPF 0-2  <3 RBC/hpf  Bacteria, UA FEW (*) RARE  SODIUM, URINE, RANDOM     Status: None   Collection Time    05/12/13  2:10 AM      Result Value Ref Range   Sodium, Ur 104    OSMOLALITY, URINE     Status: Abnormal   Collection Time    05/12/13  2:10 AM      Result Value Ref Range   Osmolality, Ur 372 (*) 390 - 1090 mOsm/kg   Comment: Performed at [facility] ACID LEVEL     Status: Abnormal   Collection Time    05/12/13  2:45 AM      Result Value Ref Range   Valproic Acid Lvl 102.8 (*) 50.0 - 100.0 ug/mL  CORTISOL     Status: None   Collection Time    05/12/13  6:15 AM      Result Value Ref Range   Cortisol, Plasma 7.8     Comment: (NOTE)     AM:  4.3 - 22.4 ug/dL     PM:  3.1 - 16.7 ug/dL     Performed at [facility]  TSH     Status: None   Collection Time    05/12/13  6:15 AM      Result Value Ref Range   TSH 0.763  0.350 - 4.500 uIU/mL   Comment: Please note change in reference range.     Performed at [place]     Status: Abnormal   Collection Time    05/12/13  6:15 AM      Result Value Ref Range   Osmolality 257 (*) 275 - 300 mOsm/kg   Comment: Performed at [place]     Status: Abnormal   Collection Time    05/12/13 12:57 PM      Result Value Ref Range   Sodium 127 (*) 137 - 147 mEq/L   Potassium 4.3  3.7 - 5.3 mEq/L   Chloride 88 (*) 96 - 112 mEq/L   CO2 30  19 - 32 mEq/L   Glucose, Bld 123 (*) 70 - 99 mg/dL   BUN 5 (*) 6 - 23 mg/dL    Creatinine, Ser 0.51  0.50 - 1.10 mg/dL   Calcium 9.1  8.4 - 10.5 mg/dL   GFR calc non Af Amer >90  >90 mL/min   GFR calc Af Amer >90  >90 mL/min   Comment: (NOTE)     The eGFR has been calculated using the CKD EPI equation.     This calculation has not been validated in all clinical situations.     eGFR's persistently <90 mL/min signify possible Chronic Kidney     Disease.  APTT     Status: None   Collection Time    05/13/13  6:43 AM      Result Value Ref Range   aPTT 30  24 - 37 seconds  PROTIME-INR     Status: None   Collection Time    05/13/13  6:43 AM      Result Value Ref Range   Prothrombin Time 13.7  11.6 - 15.2 seconds   INR 1.07  0.00 - 1.49  HEMOGLOBIN A1C     Status: None   Collection Time    05/13/13  6:43 AM      Result Value Ref Range   Hemoglobin A1C 5.4  <5.7 %   Comment: (NOTE)  According to the ADA Clinical Practice Recommendations for 2011, when     HbA1c is used as a screening test:      >=6.5%   Diagnostic of Diabetes Mellitus               (if abnormal result is confirmed)     5.7-6.4%   Increased risk of developing Diabetes Mellitus     References:Diagnosis and Classification of Diabetes Mellitus,Diabetes     PNTI,1443,15(QMGQQ 1):S62-S69 and Standards of Medical Care in             Diabetes - 2011,Diabetes PYPP,5093,26 (Suppl 1):S11-S61.   Mean Plasma Glucose 108  <117 mg/dL   Comment: Performed at [place]     Status: Abnormal   Collection Time    05/13/13  6:43 AM      Result Value Ref Range   Sodium 130 (*) 137 - 147 mEq/L   Potassium 3.9  3.7 - 5.3 mEq/L   Chloride 91 (*) 96 - 112 mEq/L   CO2 29  19 - 32 mEq/L   Glucose, Bld 93  70 - 99 mg/dL   BUN 8  6 - 23 mg/dL   Creatinine, Ser 0.56  0.50 - 1.10 mg/dL   Calcium 9.6  8.4 - 10.5 mg/dL   GFR calc non Af Amer >90  >90 mL/min   GFR calc Af Amer >90  >90 mL/min   Comment: (NOTE)      The eGFR has been calculated using the CKD EPI equation.     This calculation has not been validated in all clinical situations.     eGFR's persistently <90 mL/min signify possible Chronic Kidney     Disease.  LIPID PANEL     Status: Abnormal   Collection Time    05/13/13  6:44 AM      Result Value Ref Range   Cholesterol 202 (*) 0 - 200 mg/dL   Triglycerides 86  <150 mg/dL   HDL 113  >39 mg/dL   Total CHOL/HDL Ratio 1.8     VLDL 17  0 - 40 mg/dL   LDL Cholesterol 72  0 - 99 mg/dL   Comment:            Total Cholesterol/HDL:CHD Risk     Coronary Heart Disease Risk Table                         Men   Women      1/2 Average Risk   3.4   3.3      Average Risk       5.0   4.4      2 X Average Risk   9.6   7.1      3 X Average Risk  23.4   11.0                Use the calculated Patient Ratio     above and the CHD Risk Table     to determine the patient's CHD Risk.                ATP III CLASSIFICATION (LDL):      <100     mg/dL   Optimal      100-129  mg/dL   Near or Above                        Optimal  130-159  mg/dL   Borderline      160-189  mg/dL   High      >190     mg/dL   Very High  VITAMIN B12     Status: Abnormal   Collection Time    05/13/13 11:08 AM      Result Value Ref Range   Vitamin B-12 1505 (*) 211 - 911 pg/mL   Comment: Performed at [facility]  RPR     Status: None   Collection Time    05/13/13 11:08 AM      Result Value Ref Range   RPR NON REAC  NON REAC   Comment: Performed at [place]     Status: None   Collection Time    05/13/13 11:08 AM      Result Value Ref Range   Homocysteine 8.4  4.0 - 15.4 umol/L   Comment: Performed at [place]     Status: None   Collection Time    05/13/13 11:08 AM      Result Value Ref Range   Sed Rate 2  0 - 22 mm/hr  ANA     Status: None   Collection Time    05/13/13 11:08 AM      Result Value Ref Range   ANA NEGATIVE  NEGATIVE    Comment: Performed at [place] [facility]     Status: None   Collection Time    05/13/13 11:08 AM      Result Value Ref Range   Total CK 66  7 - 177 U/L   CK, MB 2.2  0.3 - 4.0 ng/mL   Relative Index RELATIVE INDEX IS INVALID  0.0 - 2.5   Comment: WHEN CK < 100 U/L                Performed at [facility]  HIV ANTIBODY (ROUTINE TESTING)     Status: None   Collection Time    05/13/13 11:08 AM      Result Value Ref Range   HIV 1&2 Ab, 4th Generation NONREACTIVE  NONREACTIVE   Comment: (NOTE)     A NONREACTIVE HIV Ag/Ab result does not exclude HIV infection since     the time frame for seroconversion is variable. If acute HIV infection     is suspected, a HIV-1 RNA Qualitative TMA test is recommended.     HIV-1/2 Antibody Diff         Not indicated.     HIV-1 RNA, Qual TMA           Not indicated.     PLEASE NOTE: This information has been disclosed to you from records     whose confidentiality may be protected by state law. If your state     requires such protection, then the state law prohibits you from making     any further disclosure of the information without the specific written     consent of the person to whom it pertains, or as otherwise permitted     by law. A general authorization for the release of medical or other     information is NOT sufficient for this purpose.     The performance of this assay has not been clinically validated in     patients less than 43 years old.     Performed at [place]  Status: Abnormal   Collection Time    05/14/13  8:55 AM      Result Value Ref Range   Sodium 130 (*) 137 - 147 mEq/L   Potassium 4.8  3.7 - 5.3 mEq/L   Comment: DELTA CHECK NOTED   Chloride 93 (*) 96 - 112 mEq/L   CO2 30  19 - 32 mEq/L   Glucose, Bld 83  70 - 99 mg/dL   BUN 7  6 - 23 mg/dL   Creatinine, Ser 0.53  0.50 - 1.10 mg/dL   Calcium 9.5  8.4 - 10.5 mg/dL   GFR calc non Af Amer >90  >90 mL/min    GFR calc Af Amer >90  >90 mL/min   Comment: (NOTE)     The eGFR has been calculated using the CKD EPI equation.     This calculation has not been validated in all clinical situations.     eGFR's persistently <90 mL/min signify possible Chronic Kidney     Disease.   Labs are reviewed and are pertinent for Na+ 130, Chloride 93, Vitamin B-12 1505, Blood Osmolality 257, Urine Osmolality 372 and Valproic Acid 102.8.  Current Facility-Administered Medications  Medication Dose Route Frequency Provider Last Rate Last Dose  . [START ON 05/15/2013] aspirin EC tablet 81 mg  81 mg Oral Daily Lurena Nida, NP      . divalproex (DEPAKOTE ER) 24 hr tablet 1,500 mg  1,500 mg Oral QHS Lurena Nida, NP   1,500 mg at 05/14/13 2240  . risperiDONE (RISPERDAL) tablet 4 mg  4 mg Oral QHS Lurena Nida, NP   4 mg at 05/14/13 2240  . [START ON 05/15/2013] simvastatin (ZOCOR) tablet 20 mg  20 mg Oral Daily Lurena Nida, NP      . Derrill Memo ON 05/15/2013] thiamine (VITAMIN B-1) tablet 100 mg  100 mg Oral Daily Lurena Nida, NP        Psychiatric Specialty Exam:     Blood pressure 153/91, pulse 69, temperature 96.7 F (35.9 C), temperature source Oral, resp. rate 18, height _0  (1.651 m), weight 50.349 kg (111 lb).Body mass index is 18.47 kg/(m^2).  General Appearance: Fairly Groomed  Engineer, water::  Fair  Speech:  Clear and Coherent  Volume:  Normal  Mood:  Anxious  Affect:  Congruent  Thought Process:  Tangential  Orientation:  Full (Time, Place, and Person)  Thought Content:  WDL  Suicidal Thoughts:  No  Homicidal Thoughts:  No  Memory:  Immediate;   Poor Recent;   Poor Remote;   Poor  Judgement:  Poor  Insight:  Lacking  Psychomotor Activity:  Tremor  Concentration:  Fair  Recall:  [place]: Fair  Akathisia:  No  Handed:  Right  AIMS (if indicated):     Assets:  Housing Social Support  Sleep:      Musculoskeletal: Strength & Muscle Tone: Right grip slightly <  left Gait & Station: normal Patient leans: N/A  Treatment Plan Summary: Daily contact with patient to assess and evaluate symptoms and progress in treatment Medication management Will continue home medications  Disposition: Evaluation overnight in [facility] Observation. Final disposition to made in the morning.   Serena Colonel, FNP-BC 05/14/2013 11:04 PM  Patient seen, evaluated and I agree with notes by Nurse Practitioner. Corena Pilgrim, MD

## 2013-05-15 DIAGNOSIS — F319 Bipolar disorder, unspecified: Principal | ICD-10-CM

## 2013-05-15 MED ORDER — BENZTROPINE MESYLATE 1 MG PO TABS
ORAL_TABLET | ORAL | Status: AC
  Filled 2013-05-15: qty 1

## 2013-05-15 MED ORDER — BENZTROPINE MESYLATE 1 MG PO TABS
1.0000 mg | ORAL_TABLET | Freq: Two times a day (BID) | ORAL | Status: DC
  Administered 2013-05-15: 1 mg via ORAL
  Filled 2013-05-15 (×2): qty 1

## 2013-05-15 MED ORDER — BENZTROPINE MESYLATE 1 MG PO TABS: 1.0000 mg | ORAL_TABLET | Freq: Two times a day (BID) | ORAL | Status: DC

## 2013-05-15 NOTE — [facility] Suicide Risk Assessment (Cosign Needed)
Suicide Risk Assessment  Discharge Assessment     Demographic Factors:  Caucasian, Low socioeconomic status and Unemployed  Total Time spent with patient: 30 minutes  Psychiatric Specialty Exam:     Blood pressure 104/78, pulse 90, temperature 97.8 F (36.6 C), temperature source Oral, resp. rate 18, height 5\' 5"  (1.651 m), weight 50.349 kg (111 lb).Body mass index is 18.47 kg/(m^2).  General Appearance: Casual and Fairly Groomed  Engineer, water::  Good  Speech:  Clear and Coherent and Normal Rate  Volume:  Normal  Mood:  Depressed  Affect:  Appropriate, Congruent and Depressed  Thought Process:  Coherent  Orientation:  Full (Time, Place, and Person)  Thought Content:  NA  Suicidal Thoughts:  No  Homicidal Thoughts:  No  Memory:  Immediate;   Good Recent;   Good Remote;   Good  Judgement:  Fair  Insight:  Good  Psychomotor Activity:  Tremor  Concentration:  Good  Recall:  Good  Fund of Knowledge:Good  Language: Good  Akathisia:  tremors of her hand  Handed:  Right  AIMS (if indicated):     Assets:  Desire for Improvement  Sleep:       Musculoskeletal: Strength & Muscle Tone: within normal limits Gait & Station: normal Patient leans: N/A   Mental Status Per Nursing Assessment::   On Admission:  NA  Current Mental Status by Physician: NA  Loss Factors: NA  Historical Factors: NA  Risk Reduction Factors:   Living with another person, especially a relative  Continued Clinical Symptoms:  Bipolar Disorder:   Depressive phase Depression:   Delusional  Cognitive Features That Contribute To Risk:  Polarized thinking    Suicide Risk:  Minimal: No identifiable suicidal ideation.  Patients presenting with no risk factors but with morbid ruminations; may be classified as minimal risk based on the severity of the depressive symptoms  Discharge Diagnoses:   AXIS I:  Bipolar, Depressed AXIS II:  Deferred AXIS III:   Past Medical History  Diagnosis Date  .  Bipolar 1 disorder   . Anxiety   . Depression   . Headache(784.0)   . Neuromuscular disorder     shaking of hands   . Chronic kidney disease     uti  currently,  hx bladder spasms   AXIS IV:  other psychosocial or environmental problems and problems related to social environment AXIS V:  51-60 moderate symptoms  Plan Of Care/Follow-up recommendations:  Activity:  as tolerated Diet:  Regular, no salt restriction at this time.  Is patient on multiple antipsychotic therapies at discharge:  No   Has Patient had three or more failed trials of antipsychotic monotherapy by history:  No  Recommended Plan for Multiple Antipsychotic Therapies: NA    Delfin Gant   PMHNP-BC 05/15/2013, 9:43 AM

## 2013-05-15 NOTE — Discharge Summary (Signed)
Patient ID: Cassidy Smith, female   DOB: 05-05-1955, 57 y.o.   MRN: [id number] Diagnosis: Bipolar 1 Disorder Patient, female Caucasian was seen today in our observation area for discharge back home.  I will refer you to H/P note of yesterday.  Patient spent the night in our observation unit and reports feeling much better.  She reports her hand tremors are decreased and she is able to perform her ADLS by herself.  She is alert, oriented x 4 and reported good night sleep.  Patient denies paranoia as when she accused her brother of monitoring her through a TV.  She denies blurry vision or headache at this time.  Patient denies SI/HI/AVH.  Writer spoke with her brother whom patient is staying with.  Brother reports his concern that patient is not a threat to anybody of herself but she is disorganized and does not care for her personal needs.  He c/o that this patient litters her room with things she does not need and even does not flush her toilet without somebody reminding her.  This Probation officer informed him that this is not a justification to keep patient but we will discuss these issues with patient.  He has agreed to pick her up later today after discharge.  Psychiatric Specialty Exam: Physical Exam  ROS  Blood pressure 104/78, pulse 90, temperature 97.8 F (36.6 C), temperature source Oral, resp. rate 18, height 5\' 5"  (1.651 m), weight 50.349 kg (111 lb).Body mass index is 18.47 kg/(m^2).  General Appearance: Casual and Fairly Groomed  Engineer, water::  Good  Speech:  Clear and Coherent and Normal Rate  Volume:  Normal  Mood:  Depressed  Affect:  Appropriate, Congruent and Depressed  Thought Process:  Coherent  Orientation:  Full (Time, Place, and Person)  Thought Content:  NA  Suicidal Thoughts:  No  Homicidal Thoughts:  No  Memory:  Immediate;   Good Recent;   Good Remote;   Good  Judgement:  Fair  Insight:  Good  Psychomotor Activity:  Tremor and tremors of her hands are decreasing since  Risperdal was changed from 8 mg to 4 mg po qhs  Concentration:  Good  Recall:  Good  Akathisia:  Hand tremors visible.  Handed:  Right  AIMS (if indicated):     Assets:  Desire for Improvement  Sleep:     Consult with Dr Darleene Cleaver who agrees with the plan to discharge patient home Plan:  We will discharge patient home to her brother We will start patient on cogentin 1 mg po bid for her EPS We will give patient information on how to secure a PMD to manage her Hyponatremia Patient is instructed to come back to the ER for severe EPS/ TREMOR of her hands  Charmaine Downs    PMHNP-BC   Read and agreed.  Corena Pilgrim, MD 05/15/2013 3:16 PM

## 2013-05-15 NOTE — Progress Notes (Signed)
Patient ID: Cassidy Smith, female   DOB: 1955/05/02, 58 y.o.   MRN: [id number]   Pt was discharged home with brother, all discharge instructions were given to patients brother no issues or concerns noted at time of discharge. Pt and brother were instructed to follow up with mental health and per brother patient is seen by Dr. Hoyle Barr. Pt and brother were also given a list of PCP providers, per [name]'s orders due to patient not having a PCP. Pt and brother were also instructed to notify pts neurologist to inform him of the recommendations and the change with the patients medication. Pt reported being negative SI/HI, no AH/VH noted.

## 2013-05-15 NOTE — Progress Notes (Signed)
Patient ID: Cassidy Smith, female   DOB: [birth year], 58 y.o.   MRN: [id number]  D: Pt has been appropriate in the obs unit this morning, she has been requesting to be discharged because she reports that she feels fine. Pt reported that her doctor referred her because he was concerned and just wanted a second opinion. Pt reported that she was 55, however she is 77. Pt was able to report the day, time, and location. Pt reported being negative SI/HI, no AH/VH noted. A: 15 min checks continued for patient safety. R: Pts safety maintained.

## 2013-05-18 NOTE — Consult Note (Signed)
Case discussed, agree with plan 

## 2014-04-19 ENCOUNTER — Other Ambulatory Visit (HOSPITAL_COMMUNITY): Payer: Self-pay | Admitting: Family

## 2014-04-19 DIAGNOSIS — Z1231 Encounter for screening mammogram for malignant neoplasm of breast: Secondary | ICD-10-CM

## 2014-05-25 ENCOUNTER — Ambulatory Visit (HOSPITAL_COMMUNITY): Payer: Self-pay

## 2014-06-01 ENCOUNTER — Ambulatory Visit (HOSPITAL_COMMUNITY): Payer: Self-pay

## 2014-11-08 ENCOUNTER — Emergency Department (HOSPITAL_COMMUNITY): Payer: Medicaid Other

## 2014-11-08 ENCOUNTER — Emergency Department (HOSPITAL_COMMUNITY)
Admission: EM | Admit: 2014-11-08 | Discharge: 2014-11-08 | Disposition: A | Discharge status: Home / Self Care | Payer: Medicaid Other | Attending: Emergency Medicine | Admitting: Emergency Medicine

## 2014-11-08 ENCOUNTER — Encounter (HOSPITAL_COMMUNITY): Payer: Self-pay | Admitting: Emergency Medicine

## 2014-11-08 ENCOUNTER — Other Ambulatory Visit: Payer: Self-pay

## 2014-11-08 DIAGNOSIS — M549 Dorsalgia, unspecified: Secondary | ICD-10-CM | POA: Diagnosis not present

## 2014-11-08 DIAGNOSIS — M542 Cervicalgia: Secondary | ICD-10-CM | POA: Diagnosis not present

## 2014-11-08 DIAGNOSIS — F319 Bipolar disorder, unspecified: Secondary | ICD-10-CM | POA: Insufficient documentation

## 2014-11-08 DIAGNOSIS — Z7982 Long term (current) use of aspirin: Secondary | ICD-10-CM | POA: Diagnosis not present

## 2014-11-08 DIAGNOSIS — F419 Anxiety disorder, unspecified: Secondary | ICD-10-CM | POA: Insufficient documentation

## 2014-11-08 DIAGNOSIS — Z720 Tobacco use: Secondary | ICD-10-CM | POA: Insufficient documentation

## 2014-11-08 DIAGNOSIS — E860 Dehydration: Secondary | ICD-10-CM | POA: Insufficient documentation

## 2014-11-08 DIAGNOSIS — N189 Chronic kidney disease, unspecified: Secondary | ICD-10-CM | POA: Insufficient documentation

## 2014-11-08 DIAGNOSIS — R079 Chest pain, unspecified: Secondary | ICD-10-CM | POA: Insufficient documentation

## 2014-11-08 DIAGNOSIS — Z79899 Other long term (current) drug therapy: Secondary | ICD-10-CM | POA: Diagnosis not present

## 2014-11-08 DIAGNOSIS — M25551 Pain in right hip: Secondary | ICD-10-CM | POA: Diagnosis not present

## 2014-11-08 DIAGNOSIS — R251 Tremor, unspecified: Secondary | ICD-10-CM | POA: Insufficient documentation

## 2014-11-08 DIAGNOSIS — R51 Headache: Secondary | ICD-10-CM | POA: Insufficient documentation

## 2014-11-08 LAB — CBC WITH DIFFERENTIAL/PLATELET
BASOS PCT: 0 %
Basophils Absolute: 0 10*3/uL (ref 0.0–0.1)
EOS PCT: 1 %
Eosinophils Absolute: 0.1 10*3/uL (ref 0.0–0.7)
HEMATOCRIT: 36.2 % (ref 36.0–46.0)
Hemoglobin: 12.6 g/dL (ref 12.0–15.0)
Lymphocytes Relative: 55 %
Lymphs Abs: 2.7 10*3/uL (ref 0.7–4.0)
MCH: 33.3 pg (ref 26.0–34.0)
MCHC: 34.8 g/dL (ref 30.0–36.0)
MCV: 95.8 fL (ref 78.0–100.0)
MONO ABS: 0.4 10*3/uL (ref 0.1–1.0)
MONOS PCT: 8 %
NEUTROS ABS: 1.8 10*3/uL (ref 1.7–7.7)
Neutrophils Relative %: 36 %
Platelets: 132 10*3/uL — ABNORMAL LOW (ref 150–400)
RBC: 3.78 MIL/uL — ABNORMAL LOW (ref 3.87–5.11)
RDW: 13.1 % (ref 11.5–15.5)
WBC: 4.9 10*3/uL (ref 4.0–10.5)

## 2014-11-08 LAB — BASIC METABOLIC PANEL
ANION GAP: 8 (ref 5–15)
BUN: 13 mg/dL (ref 6–20)
CALCIUM: 9.5 mg/dL (ref 8.9–10.3)
CO2: 31 mmol/L (ref 22–32)
Chloride: 91 mmol/L — ABNORMAL LOW (ref 101–111)
Creatinine, Ser: 0.54 mg/dL (ref 0.44–1.00)
GFR calc Af Amer: >60 mL/min (ref >60)
GFR calc non Af Amer: >60 mL/min (ref >60)
GLUCOSE: 92 mg/dL (ref 65–99)
Potassium: 4.2 mmol/L (ref 3.5–5.1)
Sodium: 130 mmol/L — ABNORMAL LOW (ref 135–145)

## 2014-11-08 MED ORDER — ACETAMINOPHEN 325 MG PO TABS
650.0000 mg | ORAL_TABLET | Freq: Once | ORAL | Status: AC
  Administered 2014-11-08: 650 mg via ORAL
  Filled 2014-11-08: qty 2

## 2014-11-08 MED ORDER — ACETAMINOPHEN 325 MG PO TABS: 650.0000 mg | ORAL_TABLET | Freq: Four times a day (QID) | ORAL | Status: DC | PRN
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
## 2014-11-08 NOTE — ED Notes (Signed)
Patient with c/o right hip and leg pain x 6-8 weeks. States she has been falling, but that "my balance has always been hard to keep". Patient was seen at the health department and told to come here. Patient lives with her brother, but they do not help her with ADLs. Patient is somewhat independent at home, but family states it is not safe by herself. Patient with tremors, unknown etiology.
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
## 2014-11-08 NOTE — ED Notes (Signed)
MD at bedside. 

## 2015-03-16 ENCOUNTER — Ambulatory Visit (INDEPENDENT_AMBULATORY_CARE_PROVIDER_SITE_OTHER): Payer: Self-pay | Admitting: Otolaryngology

## 2015-04-20 ENCOUNTER — Ambulatory Visit (INDEPENDENT_AMBULATORY_CARE_PROVIDER_SITE_OTHER): Payer: Self-pay | Admitting: Otolaryngology

## 2015-06-07 ENCOUNTER — Emergency Department (HOSPITAL_COMMUNITY): Payer: Medicaid Other

## 2015-06-07 ENCOUNTER — Inpatient Hospital Stay (HOSPITAL_COMMUNITY)
Admission: EM | Admit: 2015-06-07 | Discharge: 2015-06-11 | DRG: 312 | Disposition: A | Discharge status: Home Health | Payer: Medicaid Other | Attending: Internal Medicine | Admitting: Internal Medicine

## 2015-06-07 ENCOUNTER — Encounter (HOSPITAL_COMMUNITY): Payer: Self-pay | Admitting: Emergency Medicine

## 2015-06-07 DIAGNOSIS — T426X5A Adverse effect of other antiepileptic and sedative-hypnotic drugs, initial encounter: Secondary | ICD-10-CM | POA: Diagnosis present

## 2015-06-07 DIAGNOSIS — G9340 Encephalopathy, unspecified: Secondary | ICD-10-CM | POA: Diagnosis present

## 2015-06-07 DIAGNOSIS — Z803 Family history of malignant neoplasm of breast: Secondary | ICD-10-CM | POA: Diagnosis not present

## 2015-06-07 DIAGNOSIS — F319 Bipolar disorder, unspecified: Secondary | ICD-10-CM | POA: Diagnosis present

## 2015-06-07 DIAGNOSIS — F17210 Nicotine dependence, cigarettes, uncomplicated: Secondary | ICD-10-CM | POA: Diagnosis present

## 2015-06-07 DIAGNOSIS — G20 Parkinson's disease: Secondary | ICD-10-CM | POA: Diagnosis present

## 2015-06-07 DIAGNOSIS — S0081XA Abrasion of other part of head, initial encounter: Secondary | ICD-10-CM

## 2015-06-07 DIAGNOSIS — Z8659 Personal history of other mental and behavioral disorders: Secondary | ICD-10-CM

## 2015-06-07 DIAGNOSIS — M6282 Rhabdomyolysis: Secondary | ICD-10-CM | POA: Diagnosis present

## 2015-06-07 DIAGNOSIS — T796XXA Traumatic ischemia of muscle, initial encounter: Secondary | ICD-10-CM

## 2015-06-07 DIAGNOSIS — F419 Anxiety disorder, unspecified: Secondary | ICD-10-CM | POA: Diagnosis present

## 2015-06-07 DIAGNOSIS — S0083XA Contusion of other part of head, initial encounter: Secondary | ICD-10-CM

## 2015-06-07 DIAGNOSIS — N390 Urinary tract infection, site not specified: Secondary | ICD-10-CM | POA: Diagnosis present

## 2015-06-07 DIAGNOSIS — D696 Thrombocytopenia, unspecified: Secondary | ICD-10-CM | POA: Diagnosis present

## 2015-06-07 DIAGNOSIS — Y92009 Unspecified place in unspecified non-institutional (private) residence as the place of occurrence of the external cause: Secondary | ICD-10-CM

## 2015-06-07 DIAGNOSIS — Z8673 Personal history of transient ischemic attack (TIA), and cerebral infarction without residual deficits: Secondary | ICD-10-CM

## 2015-06-07 DIAGNOSIS — Z811 Family history of alcohol abuse and dependence: Secondary | ICD-10-CM

## 2015-06-07 DIAGNOSIS — R278 Other lack of coordination: Secondary | ICD-10-CM | POA: Diagnosis present

## 2015-06-07 DIAGNOSIS — Z23 Encounter for immunization: Secondary | ICD-10-CM | POA: Diagnosis not present

## 2015-06-07 DIAGNOSIS — B9620 Unspecified Escherichia coli [E. coli] as the cause of diseases classified elsewhere: Secondary | ICD-10-CM | POA: Diagnosis present

## 2015-06-07 DIAGNOSIS — E871 Hypo-osmolality and hyponatremia: Secondary | ICD-10-CM | POA: Diagnosis present

## 2015-06-07 DIAGNOSIS — R202 Paresthesia of skin: Secondary | ICD-10-CM

## 2015-06-07 DIAGNOSIS — I951 Orthostatic hypotension: Secondary | ICD-10-CM | POA: Diagnosis present

## 2015-06-07 DIAGNOSIS — W19XXXA Unspecified fall, initial encounter: Secondary | ICD-10-CM

## 2015-06-07 HISTORY — DX: Pain in unspecified hip: M25.559

## 2015-06-07 HISTORY — DX: Rhabdomyolysis: M62.82

## 2015-06-07 HISTORY — DX: Other chronic pain: G89.29

## 2015-06-07 LAB — URINALYSIS, ROUTINE W REFLEX MICROSCOPIC
Bilirubin Urine: NEGATIVE
Glucose, UA: NEGATIVE mg/dL
NITRITE: NEGATIVE
PROTEIN: NEGATIVE mg/dL
Specific Gravity, Urine: 1.015 (ref 1.005–1.030)
pH: 6 (ref 5.0–8.0)

## 2015-06-07 LAB — CBC WITH DIFFERENTIAL/PLATELET
BASOS PCT: 0 %
Basophils Absolute: 0 10*3/uL (ref 0.0–0.1)
EOS PCT: 1 %
Eosinophils Absolute: 0.1 10*3/uL (ref 0.0–0.7)
HEMATOCRIT: 41 % (ref 36.0–46.0)
HEMOGLOBIN: 14.4 g/dL (ref 12.0–15.0)
Lymphocytes Relative: 28 %
Lymphs Abs: 2.2 10*3/uL (ref 0.7–4.0)
MCH: 32 pg (ref 26.0–34.0)
MCHC: 35.1 g/dL (ref 30.0–36.0)
MCV: 91.1 fL (ref 78.0–100.0)
MONO ABS: 0.8 10*3/uL (ref 0.1–1.0)
Monocytes Relative: 10 %
NEUTROS PCT: 61 %
Neutro Abs: 4.6 10*3/uL (ref 1.7–7.7)
Platelets: 97 10*3/uL — ABNORMAL LOW (ref 150–400)
RBC: 4.5 MIL/uL (ref 3.87–5.11)
RDW: 13.3 % (ref 11.5–15.5)
WBC MORPHOLOGY: INCREASED
WBC: 7.7 10*3/uL (ref 4.0–10.5)

## 2015-06-07 LAB — ACETAMINOPHEN LEVEL: Acetaminophen (Tylenol), Serum: <10 ug/mL — ABNORMAL LOW (ref 10–30)

## 2015-06-07 LAB — ETHANOL

## 2015-06-07 LAB — RAPID URINE DRUG SCREEN, HOSP PERFORMED
Amphetamines: NOT DETECTED
BENZODIAZEPINES: NOT DETECTED
Barbiturates: NOT DETECTED
COCAINE: NOT DETECTED
OPIATES: NOT DETECTED
Tetrahydrocannabinol: NOT DETECTED

## 2015-06-07 LAB — CK: Total CK: 2798 U/L — ABNORMAL HIGH (ref 38–234)

## 2015-06-07 LAB — COMPREHENSIVE METABOLIC PANEL
ALT: 24 U/L (ref 14–54)
AST: 67 U/L — AB (ref 15–41)
Albumin: 3.7 g/dL (ref 3.5–5.0)
Alkaline Phosphatase: 63 U/L (ref 38–126)
Anion gap: 6 (ref 5–15)
BILIRUBIN TOTAL: 0.3 mg/dL (ref 0.3–1.2)
BUN: 15 mg/dL (ref 6–20)
CHLORIDE: 92 mmol/L — AB (ref 101–111)
CO2: 32 mmol/L (ref 22–32)
CREATININE: 0.51 mg/dL (ref 0.44–1.00)
Calcium: 9.5 mg/dL (ref 8.9–10.3)
GFR calc Af Amer: >60 mL/min (ref >60)
Glucose, Bld: 76 mg/dL (ref 65–99)
Potassium: 3.8 mmol/L (ref 3.5–5.1)
Sodium: 130 mmol/L — ABNORMAL LOW (ref 135–145)
Total Protein: 6.5 g/dL (ref 6.5–8.1)

## 2015-06-07 LAB — VALPROIC ACID LEVEL: Valproic Acid Lvl: 81 ug/mL (ref 50.0–100.0)

## 2015-06-07 LAB — SALICYLATE LEVEL: Salicylate Lvl: <4 mg/dL (ref 2.8–30.0)

## 2015-06-07 LAB — URINE MICROSCOPIC-ADD ON

## 2015-06-07 LAB — TSH: TSH: 0.415 u[IU]/mL (ref 0.350–4.500)

## 2015-06-07 LAB — TROPONIN I: Troponin I: <0.03 ng/mL (ref <0.031)

## 2015-06-07 LAB — VITAMIN B12: VITAMIN B 12: 1693 pg/mL — AB (ref 180–914)

## 2015-06-07 MED ORDER — ACETAMINOPHEN 325 MG PO TABS: 650.0000 mg | ORAL_TABLET | Freq: Four times a day (QID) | ORAL | Status: DC | PRN

## 2015-06-07 MED ORDER — VITAMIN B-12 100 MCG PO TABS
100.0000 ug | ORAL_TABLET | Freq: Every day | ORAL | Status: DC
  Filled 2015-06-07 (×2): qty 1

## 2015-06-07 MED ORDER — SODIUM CHLORIDE 0.9 % IV BOLUS (SEPSIS)
1000.0000 mL | Freq: Once | INTRAVENOUS | Status: AC
  Administered 2015-06-07: 1000 mL via INTRAVENOUS

## 2015-06-07 MED ORDER — PNEUMOCOCCAL VAC POLYVALENT 25 MCG/0.5ML IJ INJ
0.5000 mL | INJECTION | INTRAMUSCULAR | Status: DC
  Filled 2015-06-07: qty 0.5

## 2015-06-07 MED ORDER — NICOTINE 21 MG/24HR TD PT24
21.0000 mg | MEDICATED_PATCH | Freq: Every day | TRANSDERMAL | Status: DC
  Administered 2015-06-07 – 2015-06-11 (×5): 21 mg via TRANSDERMAL
  Filled 2015-06-07 (×5): qty 1

## 2015-06-07 MED ORDER — ACETAMINOPHEN 650 MG RE SUPP: 650.0000 mg | Freq: Four times a day (QID) | RECTAL | Status: DC | PRN

## 2015-06-07 MED ORDER — ONDANSETRON HCL 4 MG PO TABS: 4.0000 mg | ORAL_TABLET | Freq: Four times a day (QID) | ORAL | Status: DC | PRN

## 2015-06-07 MED ORDER — ASPIRIN EC 81 MG PO TBEC
81.0000 mg | DELAYED_RELEASE_TABLET | Freq: Every day | ORAL | Status: DC
  Administered 2015-06-07 – 2015-06-11 (×5): 81 mg via ORAL
  Filled 2015-06-07 (×5): qty 1

## 2015-06-07 MED ORDER — CEFTRIAXONE SODIUM 1 G IJ SOLR
1.0000 g | Freq: Once | INTRAMUSCULAR | Status: AC
  Administered 2015-06-07: 1 g via INTRAVENOUS
  Filled 2015-06-07: qty 10

## 2015-06-07 MED ORDER — SODIUM CHLORIDE 0.9 % IV SOLN
INTRAVENOUS | Status: DC
Start: 2015-06-07 — End: 2015-06-08
  Administered 2015-06-07: 16:00:00 via INTRAVENOUS

## 2015-06-07 MED ORDER — ONDANSETRON HCL 4 MG/2ML IJ SOLN: 4.0000 mg | Freq: Four times a day (QID) | INTRAMUSCULAR | Status: DC | PRN

## 2015-06-07 MED ORDER — SODIUM CHLORIDE 0.9 % IV SOLN
INTRAVENOUS | Status: DC
  Administered 2015-06-08 – 2015-06-11 (×6): via INTRAVENOUS

## 2015-06-07 MED ORDER — DIVALPROEX SODIUM ER 500 MG PO TB24
1500.0000 mg | ORAL_TABLET | Freq: Every day | ORAL | Status: DC
Start: 2015-06-07 — End: 2015-06-11
  Administered 2015-06-07 – 2015-06-10 (×4): 1500 mg via ORAL
  Filled 2015-06-07 (×5): qty 3

## 2015-06-07 MED ORDER — LORAZEPAM 2 MG/ML IJ SOLN: 2.0000 mg | INTRAMUSCULAR | Status: DC | PRN

## 2015-06-07 MED ORDER — TRAZODONE HCL 50 MG PO TABS
200.0000 mg | ORAL_TABLET | Freq: Every day | ORAL | Status: DC
  Administered 2015-06-07 – 2015-06-10 (×4): 200 mg via ORAL
  Filled 2015-06-07 (×4): qty 4

## 2015-06-07 MED ORDER — DEXTROSE 5 % IV SOLN
1.0000 g | INTRAVENOUS | Status: DC
  Administered 2015-06-08 – 2015-06-09 (×2): 1 g via INTRAVENOUS
  Filled 2015-06-07 (×4): qty 10

## 2015-06-07 MED ORDER — CLONAZEPAM 0.5 MG PO TABS
0.5000 mg | ORAL_TABLET | Freq: Every day | ORAL | Status: DC
  Administered 2015-06-07 – 2015-06-09 (×3): 0.5 mg via ORAL
  Filled 2015-06-07 (×3): qty 1

## 2015-06-07 NOTE — ED Notes (Addendum)
Patient arrives via EMS from home with c/o fall last night out of bed. Hit side of head. C/o headache. Alert, slow to answer questions. Swelling noted to left lower lip and left face.

## 2015-06-07 NOTE — ED Provider Notes (Signed)
CSN: [id number]     Arrival date & time 06/07/15  1227 History   First MD Initiated Contact with Patient 06/07/15 1238     Chief Complaint  Patient presents with  . Fall      HPI Pt was seen at 50. Per EMS and pt report, c/o sudden onset and resolution of one episode of fall last night. Pt is unsure when she fell or the circumstances of why she fell. Pt states she laid on the floor and was found by family today. Pt states she was unable to stand up because she "felt weak." Denies any other symptoms. Denies CP/SOB, no abd pain, no N/V/D, no focal motor weakness, no tingling/numbness in extremities.    Past Medical History  Diagnosis Date  . Bipolar 1 disorder ([place])   . Anxiety   . Depression   . Headache(784.0)   . Neuromuscular disorder ([facility])     shaking of hands   . Chronic kidney disease     uti  currently,  hx bladder spasms  . Stroke ([place])   . Chronic hip pain    Past Surgical History  Procedure Laterality Date  . Multiple extractions with alveoloplasty N/A 10/30/2012    Procedure: MULTIPLE EXTRACION 5, 6, 8, 9, 10 ,18, 19, 31 WITH MAXILLARY RIGHT AND LEFT  ALVEOLOPLASTY REDUCE MAXILLARY LEFT TUBEROSITY;  Surgeon: Gae Bon, DDS;  Location: [facility];  Service: Oral Surgery;  Laterality: N/A;   Family History  Problem Relation Age of Onset  . Breast cancer Mother   . Alcoholism Father    Social History  Substance Use Topics  . Smoking status: Current Every Day Smoker -- 1.00 packs/day for 20 years    Types: Cigarettes  . Smokeless tobacco: None  . Alcohol Use: No    Review of Systems ROS: Statement: All systems negative except as marked or noted in the HPI; Constitutional: Negative for fever and chills. ; ; Eyes: Negative for eye pain, redness and discharge. ; ; ENMT: Negative for ear pain, hoarseness, nasal congestion, sinus pressure and sore throat. ; ; Cardiovascular: Negative for chest pain, palpitations, diaphoresis, dyspnea and peripheral edema. ; ;  Respiratory: Negative for cough, wheezing and stridor. ; ; Gastrointestinal: Negative for nausea, vomiting, diarrhea, abdominal pain, blood in stool, hematemesis, jaundice and rectal bleeding. . ; ; Genitourinary: Negative for dysuria, flank pain and hematuria. ; ; Musculoskeletal: Negative for back pain and neck pain. Negative for swelling and trauma.; ; Skin: +bruising, abrasions. Negative for pruritus, rash, blisters, and skin lesion.; ; Neuro: +"weakness." Negative for headache, lightheadedness and neck stiffness. Negative for altered level of consciousness, altered mental status, extremity weakness, paresthesias, involuntary movement, seizure and syncope.      Allergies  Review of patient's allergies indicates no known allergies.  Home Medications   Prior to Admission medications   Medication Sig Start Date End Date Taking? Authorizing Provider  acetaminophen (TYLENOL) 325 MG tablet Take 2 tablets (650 mg total) by mouth every 6 (six) hours as needed for moderate pain. 11/08/14   Ripley Fraise, MD  aspirin EC 81 MG EC tablet Take 1 tablet (81 mg total) by mouth daily. 05/15/13   Samuella Cota, MD  clonazePAM (KLONOPIN) 0.5 MG tablet Take 0.5 mg by mouth at bedtime.    Historical Provider, MD  Cyanocobalamin (VITAMIN B 12 PO) Take 1 tablet by mouth daily.    Historical Provider, MD  divalproex (DEPAKOTE ER) 500 MG 24 hr tablet Take 1,500 mg by  mouth at bedtime.     Historical Provider, MD  Multiple Vitamins-Minerals (CENTRUM SILVER ADULT 50+ PO) Take 1 tablet by mouth every morning.    Historical Provider, MD  RISPERDAL CONSTA 50 MG injection Inject 50 mg into the muscle every 14 (fourteen) days. 10/24/14   Historical Provider, MD  risperidone (RISPERDAL) 4 MG tablet Take 1 tablet (4 mg total) by mouth at bedtime. 05/14/13   Samuella Cota, MD  traZODone (DESYREL) 100 MG tablet Take 200 mg by mouth at bedtime.    Historical Provider, MD   BP 170/65 mmHg  Pulse 74  Temp(Src) 98.1 F  (36.7 C) (Oral)  Resp 18  Ht 5\' 5"  (1.651 m)  Wt 120 lb (54.432 kg)  BMI 19.97 kg/m2  SpO2 95%    14:49 Orthostatic Vital Signs VP  Orthostatic Lying  - BP- Lying: 176/88 mmHg ; Pulse- Lying: 68  Orthostatic Sitting - BP- Sitting: 187/84 mmHg ; Pulse- Sitting: 68     15:27 Orthostatic Vital Signs VP  Orthostatic Standing at 0 minutes - BP- Standing at 0 minutes: 101/77 mmHg ; Pulse- Standing at 0 minutes: 96       Physical Exam  1245: Physical examination: Vital signs and O2 SAT: Reviewed; Constitutional: Well developed, Well nourished, Well hydrated, In no acute distress; Head and Face: Normocephalic, No scalp hematomas, no lacs.  +right upper periorbital and eyelid area with edema and ecchymosis. +superficial abrasions to nose, cheeks. Non-tender to palp superior and inferior orbital rim areas.  No zygoma tenderness.  No mandibular tenderness.; Eyes: EOMI, PERRL, No scleral icterus. No conjunctival injection.; ENMT: Mouth and pharynx normal, Left TM normal, Right TM normal, Mucous membranes moist, +teeth and tongue intact.  No intraoral or intranasal bleeding.  No septal hematomas.  No trismus, no malocclusion.; Neck: Supple, Trachea midline; Spine: No midline CS, TS, LS tenderness.; Cardiovascular: Regular rate and rhythm, No gallop; Respiratory: Breath sounds clear & equal bilaterally, No wheezes, Normal respiratory effort/excursion; Chest: Nontender, No deformity, Movement normal, No crepitus, No abrasions or ecchymosis.; Abdomen: Soft, Nontender, Nondistended, Normal bowel sounds, No abrasions or ecchymosis.; Genitourinary: No CVA tenderness;; Extremities: No deformity, +scattered ecchymosis in various stages of healing to bilat UE's. Full range of motion major/large joints of bilat UE's and LE's without pain or tenderness to palp, Neurovascularly intact, Pulses normal, No tenderness, No edema, Pelvis stable; Neuro: Awake, alert, confused re: events.  Major CN grossly intact. Speech slow,  occasionally slurred. Grips equal. Strength 5/5 equal bilat UE's and LE's. Moves all extremities spontaneously and to command without apparent gross focal motor deficits.; Skin: Color normal, Warm, Dry    ED Course  Procedures (including critical care time) Labs Review  Imaging Review  I have personally reviewed and evaluated these images and lab results as part of my medical decision-making.   EKG Interpretation None      MDM  MDM Reviewed: previous chart, nursing note and vitals Reviewed previous: labs and ECG Interpretation: labs, ECG, x-ray and CT scan     Results for orders placed or performed during the hospital encounter of 06/07/15  Urinalysis, Routine w reflex microscopic  Result Value Ref Range   Color, Urine YELLOW YELLOW   APPearance HAZY (A) CLEAR   Specific Gravity, Urine 1.015 1.005 - 1.030   pH 6.0 5.0 - 8.0   Glucose, UA NEGATIVE NEGATIVE mg/dL   Hgb urine dipstick TRACE (A) NEGATIVE   Bilirubin Urine NEGATIVE NEGATIVE   Ketones, ur TRACE (A) NEGATIVE mg/dL  Protein, ur NEGATIVE NEGATIVE mg/dL   Nitrite NEGATIVE NEGATIVE   Leukocytes, UA SMALL (A) NEGATIVE  Urine rapid drug screen (hosp performed)  Result Value Ref Range   Opiates NONE DETECTED NONE DETECTED   Cocaine NONE DETECTED NONE DETECTED   Benzodiazepines NONE DETECTED NONE DETECTED   Amphetamines NONE DETECTED NONE DETECTED   Tetrahydrocannabinol NONE DETECTED NONE DETECTED   Barbiturates NONE DETECTED NONE DETECTED  Acetaminophen level  Result Value Ref Range   Acetaminophen (Tylenol), Serum <10 (L) 10 - 30 ug/mL  Comprehensive metabolic panel  Result Value Ref Range   Sodium 130 (L) 135 - 145 mmol/L   Potassium 3.8 3.5 - 5.1 mmol/L   Chloride 92 (L) 101 - 111 mmol/L   CO2 32 22 - 32 mmol/L   Glucose, Bld 76 65 - 99 mg/dL   BUN 15 6 - 20 mg/dL   Creatinine, Ser 0.51 0.44 - 1.00 mg/dL   Calcium 9.5 8.9 - 10.3 mg/dL   Total Protein 6.5 6.5 - 8.1 g/dL   Albumin 3.7 3.5 - 5.0 g/dL    AST 67 (H) 15 - 41 U/L   ALT 24 14 - 54 U/L   Alkaline Phosphatase 63 38 - 126 U/L   Total Bilirubin 0.3 0.3 - 1.2 mg/dL   GFR calc non Af Amer >60 >60 mL/min   GFR calc Af Amer >60 >60 mL/min   Anion gap 6 5 - 15  Ethanol  Result Value Ref Range   Alcohol, Ethyl (B) <5 <5 mg/dL  Salicylate level  Result Value Ref Range   Salicylate Lvl 123456 2.8 - 30.0 mg/dL  Troponin I  Result Value Ref Range   Troponin I <0.03 <0.031 ng/mL  CBC with Differential  Result Value Ref Range   WBC 7.7 4.0 - 10.5 K/uL   RBC 4.50 3.87 - 5.11 MIL/uL   Hemoglobin 14.4 12.0 - 15.0 g/dL   HCT 41.0 36.0 - 46.0 %   MCV 91.1 78.0 - 100.0 fL   MCH 32.0 26.0 - 34.0 pg   MCHC 35.1 30.0 - 36.0 g/dL   RDW 13.3 11.5 - 15.5 %   Platelets 97 (L) 150 - 400 K/uL   Neutrophils Relative % 61 %   Lymphocytes Relative 28 %   Monocytes Relative 10 %   Eosinophils Relative 1 %   Basophils Relative 0 %   Neutro Abs 4.6 1.7 - 7.7 K/uL   Lymphs Abs 2.2 0.7 - 4.0 K/uL   Monocytes Absolute 0.8 0.1 - 1.0 K/uL   Eosinophils Absolute 0.1 0.0 - 0.7 K/uL   Basophils Absolute 0.0 0.0 - 0.1 K/uL   WBC Morphology INCREASED BANDS (>20% BANDS)    Smear Review PLATELET COUNT CONFIRMED BY SMEAR   CK  Result Value Ref Range   Total CK 2798 (H) 38 - 234 U/L  Valproic acid level  Result Value Ref Range   Valproic Acid Lvl 81 50.0 - 100.0 ug/mL  Urine microscopic-add on  Result Value Ref Range   Squamous Epithelial / LPF 0-5 (A) NONE SEEN   WBC, UA 6-30 0 - 5 WBC/hpf   RBC / HPF 0-5 0 - 5 RBC/hpf   Bacteria, UA MANY (A) NONE SEEN   Dg Chest 2 View 06/07/2015  CLINICAL DATA:  Status post fall last night.  Headache. EXAM: CHEST  2 VIEW COMPARISON:  None. FINDINGS: The heart size and mediastinal contours are within normal limits. Both lungs are clear. The visualized skeletal structures are unremarkable.  IMPRESSION: No active cardiopulmonary disease. Electronically Signed   By: Kathreen Devoid   On: 06/07/2015 13:28   Ct Head Wo  Contrast 06/07/2015  CLINICAL DATA:  [place] out of bed last night striking side of head, headache, slowly answer questions, LEFT facial swelling, smoker EXAM: CT HEAD WITHOUT CONTRAST CT MAXILLOFACIAL WITHOUT CONTRAST CT CERVICAL SPINE WITHOUT CONTRAST TECHNIQUE: Multidetector CT imaging of the head, cervical spine, and maxillofacial structures were performed using the standard protocol without intravenous contrast. Multiplanar CT image reconstructions of the cervical spine and maxillofacial structures were also generated. Right side of face marked with BB. COMPARISON:  None FINDINGS: CT HEAD FINDINGS Minimal scattered motion artifacts. Normal ventricular morphology. No midline shift or mass effect. No definite intracranial hemorrhage, mass lesion or evidence acute infarction. Minimal periventricular white matter hypoattenuation potentially small vessel chronic ischemic changes. No extra-axial fluid collections. Calvaria intact. CT MAXILLOFACIAL FINDINGS Visualized intracranial structures unremarkable. Intra orbital fat planes clear. Nasal septal deviation to the LEFT. Paranasal sinuses, mastoid air cells and and middle ear cavities clear. Periorbital soft tissue swelling RIGHT greater than LEFT. No definite facial bone fractures identified. CT CERVICAL SPINE FINDINGS Mild diffuse osseous demineralization. Prevertebral soft tissues normal thickness. Disc space narrowing and endplate spur formation C6-C7, less at C5-C6. Multilevel facet degenerative changes bilaterally. Vertebral body heights maintained without fracture or subluxation. Visualized skullbase intact. Incomplete posterior arch C1, developmental variant. Emphysematous changes at lung apices. Few scattered atherosclerotic calcifications. IMPRESSION: Minimal small vessel chronic ischemic changes of deep cerebral white matter. No acute intracranial abnormalities. No acute facial bone abnormalities. 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 No  acute cervical spine abnormalities. Electronically Signed   By: Lavonia Dana M.D.   On: 06/07/2015 14:15   Ct Cervical Spine Wo Contrast 06/07/2015  CLINICAL DATA:  [place] out of bed last night striking side of head, headache, slowly answer questions, LEFT facial swelling, smoker EXAM: CT HEAD WITHOUT CONTRAST CT MAXILLOFACIAL WITHOUT CONTRAST CT CERVICAL SPINE WITHOUT CONTRAST TECHNIQUE: Multidetector CT imaging of the head, cervical spine, and maxillofacial structures were performed using the standard protocol without intravenous contrast. Multiplanar CT image reconstructions of the cervical spine and maxillofacial structures were also generated. Right side of face marked with BB. COMPARISON:  None FINDINGS: CT HEAD FINDINGS Minimal scattered motion artifacts. Normal ventricular morphology. No midline shift or mass effect. No definite intracranial hemorrhage, mass lesion or evidence acute infarction. Minimal periventricular white matter hypoattenuation potentially small vessel chronic ischemic changes. No extra-axial fluid collections. Calvaria intact. CT MAXILLOFACIAL FINDINGS Visualized intracranial structures unremarkable. Intra orbital fat planes clear. Nasal septal deviation to the LEFT. Paranasal sinuses, mastoid air cells and and middle ear cavities clear. Periorbital soft tissue swelling RIGHT greater than LEFT. No definite facial bone fractures identified. CT CERVICAL SPINE FINDINGS Mild diffuse osseous demineralization. Prevertebral soft tissues normal thickness. Disc space narrowing and endplate spur formation C6-C7, less at C5-C6. Multilevel facet degenerative changes bilaterally. Vertebral body heights maintained without fracture or subluxation. Visualized skullbase intact. Incomplete posterior arch C1, developmental variant. Emphysematous changes at lung apices. Few scattered atherosclerotic calcifications. IMPRESSION: Minimal small vessel chronic ischemic changes of deep cerebral white matter. No  acute intracranial abnormalities. No acute facial bone abnormalities. 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 No acute cervical spine abnormalities. Electronically Signed   By: Lavonia Dana M.D.   On: 06/07/2015 14:15   Ct Maxillofacial Wo Cm 06/07/2015  CLINICAL DATA:  [place] out of bed last night striking side of head, headache,  slowly answer questions, LEFT facial swelling, smoker EXAM: CT HEAD WITHOUT CONTRAST CT MAXILLOFACIAL WITHOUT CONTRAST CT CERVICAL SPINE WITHOUT CONTRAST TECHNIQUE: Multidetector CT imaging of the head, cervical spine, and maxillofacial structures were performed using the standard protocol without intravenous contrast. Multiplanar CT image reconstructions of the cervical spine and maxillofacial structures were also generated. Right side of face marked with BB. COMPARISON:  None FINDINGS: CT HEAD FINDINGS Minimal scattered motion artifacts. Normal ventricular morphology. No midline shift or mass effect. No definite intracranial hemorrhage, mass lesion or evidence acute infarction. Minimal periventricular white matter hypoattenuation potentially small vessel chronic ischemic changes. No extra-axial fluid collections. Calvaria intact. CT MAXILLOFACIAL FINDINGS Visualized intracranial structures unremarkable. Intra orbital fat planes clear. Nasal septal deviation to the LEFT. Paranasal sinuses, mastoid air cells and and middle ear cavities clear. Periorbital soft tissue swelling RIGHT greater than LEFT. No definite facial bone fractures identified. CT CERVICAL SPINE FINDINGS Mild diffuse osseous demineralization. Prevertebral soft tissues normal thickness. Disc space narrowing and endplate spur formation C6-C7, less at C5-C6. Multilevel facet degenerative changes bilaterally. Vertebral body heights maintained without fracture or subluxation. Visualized skullbase intact. Incomplete posterior arch C1, developmental variant. Emphysematous changes at lung apices. Few scattered  atherosclerotic calcifications. IMPRESSION: Minimal small vessel chronic ischemic changes of deep cerebral white matter. No acute intracranial abnormalities. No acute facial bone abnormalities. 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 No acute cervical spine abnormalities. Electronically Signed   By: Lavonia Dana M.D.   On: 06/07/2015 14:15    1550:  Initially unable to stand for orthostatic VS; but after much encouragement, she was able to stand with assist. +orthostatic. +UTI, UC pending; will start IV rocephin. IVF given for elevated CK. Pt with hx low platelets and Na; both near baseline. T/C to [facility]. Clementeen Graham, case discussed, including:  HPI, pertinent PM/SHx, VS/PE, dx testing, ED course and treatment:  Agreeable to admit, requests to write temporary orders, obtain tele bed to team APAdmits.   Francine Graven, DO 06/10/15 2011

## 2015-06-07 NOTE — ED Notes (Signed)
Pt in Radiology 

## 2015-06-07 NOTE — H&P (Addendum)
TRH H&P   Patient Demographics:    Cassidy Smith, is a 60 y.o. female  MRN: [id number]   DOB - 05-17-1955  Admit Date - 06/07/2015  Outpatient Primary MD for the patient is No PCP Per Patient  Referring MD: Dr. Thurnell Garbe  Outpatient Specialists:  Reports seeing a psychiatrist  Patient coming from: Home  Chief Complaint  Patient presents with  . Fall      HPI:    Cassidy Smith  is a 60 y.o. female,With history of bipolar disorder, anxiety, remote stroke, chronic hyponatremia and? Parkinson's who presents from home with a fall. History is very limited and obtained from EMS information and ED physician. Patient is a very poor historian. He reports that she fell at home last evening and was on the floor for several hours until this morning. She is not able to provide much detail. Denies sustaining head injury or any other injury. Denies witnessed seizures, bowel or urinary incontinence. Denies nausea or vomiting, fevers, chills, chest pain, palpitations, shortness of breath, headache, blurred vision, focal weakness or numbness. Denies bowel or urinary symptoms. She reports being on medications for anxiety and bipolar disorder and seeing a psychiatrist. Reports also seeing Dr. Molli Barrows the past for? Gait abnormality. she was hospitalized 2 years back for chronic hyponatremia with findings of extensive white matter chronic lesions and suspicion for Parkinson's disease. Patient denies smoking, alcohol use or illicit drug use. Denies any physical abuse.  Course in the ED Patient was found to be bradycardic to the 40s with normal remaining vitals. Blood work showed platelets of 97, sodium of 130, chloride of 92. She had ALT of 67, normal troponin and CPK of 2700. UA was suggestive of UTI. Head CT was unremarkable, CT of the cervical spine and maxillofacial was also negative for any fracture  or injury. Chest x-ray unremarkable. Urine drug screen including alkaline level, Tylenol and salicylate level were negative. EKG showed normal sinus rhythm at 35 with old inferior infarct. Normal QTC Orthostasis was significantly positive. Patient given 1 g IV Rocephin and started on IV fluids. Hospitalist admission requested to telemetry.    Review of systems:    As outlined on history of present illness. 12 point review of systems Limited due to patient's poor history.   With Past History of the following :    Past Medical History  Diagnosis Date  . Bipolar 1 disorder ([place])   . Anxiety   . Depression   . Headache(784.0)   . Neuromuscular disorder ([facility])     shaking of hands   . Chronic kidney disease     uti  currently,  hx bladder spasms  . Stroke ([place])   . Chronic hip pain       Past Surgical History  Procedure Laterality Date  . Multiple extractions with alveoloplasty N/A 10/30/2012    Procedure: MULTIPLE EXTRACION  5, 6, 8, 9, 10 ,18, 19, 31 WITH MAXILLARY RIGHT AND LEFT  ALVEOLOPLASTY REDUCE MAXILLARY LEFT TUBEROSITY;  Surgeon: Gae Bon, DDS;  Location: [facility];  Service: Oral Surgery;  Laterality: N/A;      Social History:     Social History  Substance Use Topics  . Smoking status: Current Every Day Smoker -- 1.00 packs/day for 20 years    Types: Cigarettes  . Smokeless tobacco: Not on file  . Alcohol Use: No     Lives - At home with brother  Mobility - reportedly ambulatory     Family History :     Family History  Problem Relation Age of Onset  . Breast cancer Mother   . Alcoholism Father       Home Medications:   Prior to Admission medications   Medication Sig Start Date End Date Taking? Authorizing Provider  acetaminophen (TYLENOL) 325 MG tablet Take 2 tablets (650 mg total) by mouth every 6 (six) hours as needed for moderate pain. 11/08/14   Ripley Fraise, MD  aspirin EC 81 MG EC tablet Take 1 tablet (81 mg total) by mouth daily.  05/15/13   Samuella Cota, MD  clonazePAM (KLONOPIN) 0.5 MG tablet Take 0.5 mg by mouth at bedtime.    Historical Provider, MD  Cyanocobalamin (VITAMIN B 12 PO) Take 1 tablet by mouth daily.    Historical Provider, MD  divalproex (DEPAKOTE ER) 500 MG 24 hr tablet Take 1,500 mg by mouth at bedtime.     Historical Provider, MD  Multiple Vitamins-Minerals (CENTRUM SILVER ADULT 50+ PO) Take 1 tablet by mouth every morning.    Historical Provider, MD  RISPERDAL CONSTA 50 MG injection Inject 50 mg into the muscle every 14 (fourteen) days. 10/24/14   Historical Provider, MD  risperidone (RISPERDAL) 4 MG tablet Take 1 tablet (4 mg total) by mouth at bedtime. 05/14/13   Samuella Cota, MD  traZODone (DESYREL) 100 MG tablet Take 200 mg by mouth at bedtime.    Historical Provider, MD     Allergies:    No Known Allergies   Physical Exam:   Vitals  Blood pressure 146/72, pulse 65, temperature 98.1 F (36.7 C), temperature source Oral, resp. rate 41, height 5\' 5"  (1.651 m), weight 54.432 kg (120 lb), SpO2 92 %.   General: Middle aged thin built female lying in bed not in distress, HEENT: Bruising over her face mainly involving the right orbit, no pallor, no icterus, supple neck, no cervical lymphadenopathy Chest: Clear to auscultation bilaterally CVS: Normal S1 and S2, no murmurs rub or gallop GI: Soft, nondistended, nontender, bowel sounds present Musculoskeletal: Warm, no edema, no bone or joint tenderness CNS: Alert and oriented, nonfocal     Data Review:    CBC  Recent Labs Lab 06/07/15 1240  WBC 7.7  HGB 14.4  HCT 41.0  PLT 97*  MCV 91.1  MCH 32.0  MCHC 35.1  RDW 13.3  LYMPHSABS 2.2  MONOABS 0.8  EOSABS 0.1  BASOSABS 0.0   ------------------------------------------------------------------------------------------------------------------  Chemistries   Recent Labs Lab 06/07/15 1240  NA 130*  K 3.8  CL 92*  CO2 32  GLUCOSE 76  BUN 15  CREATININE 0.51  CALCIUM  9.5  AST 67*  ALT 24  ALKPHOS 63  BILITOT 0.3   ------------------------------------------------------------------------------------------------------------------ estimated creatinine clearance is 62.6 mL/min (by C-G formula based on Cr of 0.51). ------------------------------------------------------------------------------------------------------------------ No results for input(s): TSH, T4TOTAL, T3FREE, THYROIDAB in the last 72 hours.  Invalid input(s): FREET3  Coagulation profile No results for input(s): INR, PROTIME in the last 168 hours. ------------------------------------------------------------------------------------------------------------------- No results for input(s): DDIMER in the last 72 hours. -------------------------------------------------------------------------------------------------------------------  Cardiac Enzymes  Recent Labs Lab 06/07/15 1240  TROPONINI <0.03   ------------------------------------------------------------------------------------------------------------------ No results found for: BNP   ---------------------------------------------------------------------------------------------------------------  Urinalysis    Component Value Date/Time   COLORURINE YELLOW 06/07/2015 1450   APPEARANCEUR HAZY* 06/07/2015 1450   LABSPEC 1.015 06/07/2015 1450   PHURINE 6.0 06/07/2015 1450   GLUCOSEU NEGATIVE 06/07/2015 1450   HGBUR TRACE* 06/07/2015 1450   BILIRUBINUR NEGATIVE 06/07/2015 1450   KETONESUR TRACE* 06/07/2015 1450   PROTEINUR NEGATIVE 06/07/2015 1450   UROBILINOGEN 0.2 05/12/2013 0210   NITRITE NEGATIVE 06/07/2015 1450   LEUKOCYTESUR SMALL* 06/07/2015 1450    ----------------------------------------------------------------------------------------------------------------   Imaging Results:    Dg Chest 2 View  06/07/2015  CLINICAL DATA:  Status post fall last night.  Headache. EXAM: CHEST  2 VIEW COMPARISON:  None. FINDINGS: The  heart size and mediastinal contours are within normal limits. Both lungs are clear. The visualized skeletal structures are unremarkable. IMPRESSION: No active cardiopulmonary disease. Electronically Signed   By: Kathreen Devoid   On: 06/07/2015 13:28   Ct Head Wo Contrast  06/07/2015  CLINICAL DATA:  [place] out of bed last night striking side of head, headache, slowly answer questions, LEFT facial swelling, smoker EXAM: CT HEAD WITHOUT CONTRAST CT MAXILLOFACIAL WITHOUT CONTRAST CT CERVICAL SPINE WITHOUT CONTRAST TECHNIQUE: Multidetector CT imaging of the head, cervical spine, and maxillofacial structures were performed using the standard protocol without intravenous contrast. Multiplanar CT image reconstructions of the cervical spine and maxillofacial structures were also generated. Right side of face marked with BB. COMPARISON:  None FINDINGS: CT HEAD FINDINGS Minimal scattered motion artifacts. Normal ventricular morphology. No midline shift or mass effect. No definite intracranial hemorrhage, mass lesion or evidence acute infarction. Minimal periventricular white matter hypoattenuation potentially small vessel chronic ischemic changes. No extra-axial fluid collections. Calvaria intact. CT MAXILLOFACIAL FINDINGS Visualized intracranial structures unremarkable. Intra orbital fat planes clear. Nasal septal deviation to the LEFT. Paranasal sinuses, mastoid air cells and and middle ear cavities clear. Periorbital soft tissue swelling RIGHT greater than LEFT. No definite facial bone fractures identified. CT CERVICAL SPINE FINDINGS Mild diffuse osseous demineralization. Prevertebral soft tissues normal thickness. Disc space narrowing and endplate spur formation C6-C7, less at C5-C6. Multilevel facet degenerative changes bilaterally. Vertebral body heights maintained without fracture or subluxation. Visualized skullbase intact. Incomplete posterior arch C1, developmental variant. Emphysematous changes at lung apices. Few  scattered atherosclerotic calcifications. IMPRESSION: Minimal small vessel chronic ischemic changes of deep cerebral white matter. No acute intracranial abnormalities. No acute facial bone abnormalities. 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 No acute cervical spine abnormalities. Electronically Signed   By: Lavonia Dana M.D.   On: 06/07/2015 14:15   Ct Cervical Spine Wo Contrast  06/07/2015  CLINICAL DATA:  [place] out of bed last night striking side of head, headache, slowly answer questions, LEFT facial swelling, smoker EXAM: CT HEAD WITHOUT CONTRAST CT MAXILLOFACIAL WITHOUT CONTRAST CT CERVICAL SPINE WITHOUT CONTRAST TECHNIQUE: Multidetector CT imaging of the head, cervical spine, and maxillofacial structures were performed using the standard protocol without intravenous contrast. Multiplanar CT image reconstructions of the cervical spine and maxillofacial structures were also generated. Right side of face marked with BB. COMPARISON:  None FINDINGS: CT HEAD FINDINGS Minimal scattered motion artifacts. Normal ventricular morphology. No midline shift or mass effect. No definite intracranial hemorrhage, mass  lesion or evidence acute infarction. Minimal periventricular white matter hypoattenuation potentially small vessel chronic ischemic changes. No extra-axial fluid collections. Calvaria intact. CT MAXILLOFACIAL FINDINGS Visualized intracranial structures unremarkable. Intra orbital fat planes clear. Nasal septal deviation to the LEFT. Paranasal sinuses, mastoid air cells and and middle ear cavities clear. Periorbital soft tissue swelling RIGHT greater than LEFT. No definite facial bone fractures identified. CT CERVICAL SPINE FINDINGS Mild diffuse osseous demineralization. Prevertebral soft tissues normal thickness. Disc space narrowing and endplate spur formation C6-C7, less at C5-C6. Multilevel facet degenerative changes bilaterally. Vertebral body heights maintained without fracture or  subluxation. Visualized skullbase intact. Incomplete posterior arch C1, developmental variant. Emphysematous changes at lung apices. Few scattered atherosclerotic calcifications. IMPRESSION: Minimal small vessel chronic ischemic changes of deep cerebral white matter. No acute intracranial abnormalities. No acute facial bone abnormalities. 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 No acute cervical spine abnormalities. Electronically Signed   By: Lavonia Dana M.D.   On: 06/07/2015 14:15   Ct Maxillofacial Wo Cm  06/07/2015  CLINICAL DATA:  [place] out of bed last night striking side of head, headache, slowly answer questions, LEFT facial swelling, smoker EXAM: CT HEAD WITHOUT CONTRAST CT MAXILLOFACIAL WITHOUT CONTRAST CT CERVICAL SPINE WITHOUT CONTRAST TECHNIQUE: Multidetector CT imaging of the head, cervical spine, and maxillofacial structures were performed using the standard protocol without intravenous contrast. Multiplanar CT image reconstructions of the cervical spine and maxillofacial structures were also generated. Right side of face marked with BB. COMPARISON:  None FINDINGS: CT HEAD FINDINGS Minimal scattered motion artifacts. Normal ventricular morphology. No midline shift or mass effect. No definite intracranial hemorrhage, mass lesion or evidence acute infarction. Minimal periventricular white matter hypoattenuation potentially small vessel chronic ischemic changes. No extra-axial fluid collections. Calvaria intact. CT MAXILLOFACIAL FINDINGS Visualized intracranial structures unremarkable. Intra orbital fat planes clear. Nasal septal deviation to the LEFT. Paranasal sinuses, mastoid air cells and and middle ear cavities clear. Periorbital soft tissue swelling RIGHT greater than LEFT. No definite facial bone fractures identified. CT CERVICAL SPINE FINDINGS Mild diffuse osseous demineralization. Prevertebral soft tissues normal thickness. Disc space narrowing and endplate spur formation  C6-C7, less at C5-C6. Multilevel facet degenerative changes bilaterally. Vertebral body heights maintained without fracture or subluxation. Visualized skullbase intact. Incomplete posterior arch C1, developmental variant. Emphysematous changes at lung apices. Few scattered atherosclerotic calcifications. IMPRESSION: Minimal small vessel chronic ischemic changes of deep cerebral white matter. No acute intracranial abnormalities. No acute facial bone abnormalities. 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 No acute cervical spine abnormalities. Electronically Signed   By: Lavonia Dana M.D.   On: 06/07/2015 14:15    My personal review of EKG: Normal sinus rhythm at 77 with an old inferior infarct, normal QTC   Assessment & Plan:    Principal Problem:   Orthostatic hypotension Admit to telemetry. Aggressive IV hydration with normal saline at 150 mL/h. Head CT unremarkable. PT evaluation. Follow and seizure precaution. Repeat orthostasis in the morning. Check TSH and B12 level.  Active Problems: Acute rhabdomyolysis Possibly due to being on the floor for several hours versus seizures. Continue aggressive IV fluids. Avoid nephrotoxic agents. Ordered EEG. When necessary Ativan for seizures.Hold Risperdal.    Hyponatremia Chronic versus hypoosmolar hyponatremia.. Check urine osmolality and urine sodium. Continue IV hydration.   Bipolar disorder and anxiety Reports seeing a psychiatrist as outpatient. Resume  clonazepam. I will hold Risperdal given its association with causing orthostasis, rhabdomyolysis and bradycardia.   Tobacco abuse Counseled on cessation. Order nicotine  patch.  Thrombocytopenia No clear etiology. Avoid heparin per for now. Recheck in a.m.  History of stroke Continue aspirin  DVT Prophylaxis : SCDs  AM Labs Ordered, also please review Full Orders  Family Communication: None at bedside  Code Status :full code  Likely DC to home if cleared by  PT  Condition : Fair  Consults called: None   Admission status: Inpatient    Time spent in minutes : 70   Louellen Molder M.D on 06/07/2015 at 4:16 PM  Between 7am to 7pm - Pager - [phone]. After 7pm go to [email or web address] - password [email or web address]  [facility] - Office  [phone]

## 2015-06-07 NOTE — ED Notes (Signed)
Patient unable to stand for ortho v/s

## 2015-06-07 NOTE — ED Notes (Signed)
Patient was ambulatory to the restroom with assistance.  Patient appears more alert than when she first arrived.

## 2015-06-08 ENCOUNTER — Inpatient Hospital Stay (HOSPITAL_COMMUNITY)
Admit: 2015-06-08 | Discharge: 2015-06-08 | Disposition: A | Discharge status: Home / Self Care | Payer: Medicaid Other | Attending: Internal Medicine | Admitting: Internal Medicine

## 2015-06-08 ENCOUNTER — Inpatient Hospital Stay (HOSPITAL_COMMUNITY): Payer: Medicaid Other

## 2015-06-08 DIAGNOSIS — I951 Orthostatic hypotension: Principal | ICD-10-CM

## 2015-06-08 DIAGNOSIS — F319 Bipolar disorder, unspecified: Secondary | ICD-10-CM

## 2015-06-08 DIAGNOSIS — N390 Urinary tract infection, site not specified: Secondary | ICD-10-CM

## 2015-06-08 DIAGNOSIS — E871 Hypo-osmolality and hyponatremia: Secondary | ICD-10-CM

## 2015-06-08 DIAGNOSIS — Z8659 Personal history of other mental and behavioral disorders: Secondary | ICD-10-CM

## 2015-06-08 LAB — COMPREHENSIVE METABOLIC PANEL
ALK PHOS: 60 U/L (ref 38–126)
ALT: 30 U/L (ref 14–54)
AST: 72 U/L — AB (ref 15–41)
Albumin: 3.3 g/dL — ABNORMAL LOW (ref 3.5–5.0)
Anion gap: 8 (ref 5–15)
BILIRUBIN TOTAL: 0.5 mg/dL (ref 0.3–1.2)
BUN: 7 mg/dL (ref 6–20)
CHLORIDE: 96 mmol/L — AB (ref 101–111)
CO2: 28 mmol/L (ref 22–32)
Calcium: 9 mg/dL (ref 8.9–10.3)
Creatinine, Ser: 0.4 mg/dL — ABNORMAL LOW (ref 0.44–1.00)
GFR calc Af Amer: >60 mL/min (ref >60)
GFR calc non Af Amer: >60 mL/min (ref >60)
GLUCOSE: 90 mg/dL (ref 65–99)
POTASSIUM: 3.3 mmol/L — AB (ref 3.5–5.1)
Sodium: 132 mmol/L — ABNORMAL LOW (ref 135–145)
Total Protein: 6.1 g/dL — ABNORMAL LOW (ref 6.5–8.1)

## 2015-06-08 LAB — CBC
HEMATOCRIT: 41 % (ref 36.0–46.0)
Hemoglobin: 14.3 g/dL (ref 12.0–15.0)
MCH: 31.6 pg (ref 26.0–34.0)
MCHC: 34.9 g/dL (ref 30.0–36.0)
MCV: 90.7 fL (ref 78.0–100.0)
Platelets: 99 10*3/uL — ABNORMAL LOW (ref 150–400)
RBC: 4.52 MIL/uL (ref 3.87–5.11)
RDW: 13.2 % (ref 11.5–15.5)
WBC: 9 10*3/uL (ref 4.0–10.5)

## 2015-06-08 LAB — VITAMIN B12: VITAMIN B 12: 1727 pg/mL — AB (ref 180–914)

## 2015-06-08 LAB — CK: Total CK: 1844 U/L — ABNORMAL HIGH (ref 38–234)

## 2015-06-08 MED ORDER — POTASSIUM CHLORIDE CRYS ER 20 MEQ PO TBCR
40.0000 meq | EXTENDED_RELEASE_TABLET | ORAL | Status: AC
  Administered 2015-06-08 (×2): 40 meq via ORAL
  Filled 2015-06-08 (×2): qty 2

## 2015-06-08 MED ORDER — POLYETHYLENE GLYCOL 3350 17 G PO PACK
17.0000 g | PACK | Freq: Every day | ORAL | Status: DC
  Administered 2015-06-08 – 2015-06-11 (×4): 17 g via ORAL
  Filled 2015-06-08 (×4): qty 1

## 2015-06-08 MED ORDER — MIDODRINE HCL 5 MG PO TABS
5.0000 mg | ORAL_TABLET | Freq: Three times a day (TID) | ORAL | Status: DC
  Administered 2015-06-08: 5 mg via ORAL
  Filled 2015-06-08: qty 1

## 2015-06-08 MED ORDER — SODIUM CHLORIDE 0.9 % IV BOLUS (SEPSIS)
500.0000 mL | Freq: Once | INTRAVENOUS | Status: AC
  Administered 2015-06-08: 500 mL via INTRAVENOUS

## 2015-06-08 NOTE — Evaluation (Signed)
Physical Therapy Evaluation Patient Details Name: Cassidy Smith MRN: [id number] DOB: 02/06/1955 Today's Date: 06/08/2015   History of Present Illness  60 yo F admitted after having a fall at home, and was (+) for orthostasis, acute rhabdomyelitis due to being on the floor for several hours, UTI.  Head CT and C-spine CT both (-) for acute changes. PMH: bipolar, anxiety, depression, HA, tremors in her hands, CKD, CVA, chronic hip pain, ?Parkinsons.  Clinical Impression  Pt received in bed, with hospital sitter present, and pt is agreeable to PT evaluation.  Pt continues to demonstrate altered mental status, and sitter corrects answers that pt gives.  Pt states that she lives alone, when she lives with her brother.  Pt states she lives in a 1 level home, when it is 3 levels, and her bedroom is on the upper floor.  Pt states she does not use any DME for ambulation, and she is independent with ADL's such as dressing and bathing.  Today, she required Mod A for supine<>sit due to inability to process how to turn buttocks and hips 90* and sit on the EOB.  Min A for sit<>stand with RW.  Pt ambulated a total of ~74ft with RW and Min A in the room.  Pt needed to make 2 trips to the restroom, where she was able to perform hygiene independently.  However, when ambulating to the sink pt had difficulty getting the soap and turning the water on, and required cues from PT to accomplish these tasks.  At this point, she is recommend for home with HHPT and strict 24/7 supervision/assistance vs SNF.    Follow Up Recommendations Home health PT;Supervision/Assistance - 24 hour;SNF    Equipment Recommendations       Recommendations for Other Services       Precautions / Restrictions Precautions Precautions: Fall Restrictions Weight Bearing Restrictions: No      Mobility  Bed Mobility Overal bed mobility: Needs Assistance Bed Mobility: Supine to Sit     Supine to sit: Mod assist     General bed mobility  comments: assistance with the bed pad as pt was not able to get herself turned to sit on the EOB.  However, it seems that pt is keeping her eyes closed when she states they are open.    Transfers Overall transfer level: Needs assistance Equipment used: Rolling walker (2 wheeled) Transfers: Sit to/from Stand Sit to Stand: Min assist         General transfer comment: vc's for hand placement  Ambulation/Gait Ambulation/Gait assistance: Min assist Ambulation Distance (Feet): 30 Feet Assistive device: Rolling walker (2 wheeled) Gait Pattern/deviations: Step-through pattern;Ataxic;Trunk flexed     General Gait Details: Pt requires assistance with RW navigation.  Pt needed to void, therefore assisted pt in the bathroom.  Pt required increased safety cues in more enclosed space.  Pt able to void, then ambulated to the sink to wash her hands, then expressed need to use the bathroom again.  Pt again assisted into the bathroom where she had a BM.  Pt assisted to the sink and then to the chair.   Stairs            Wheelchair Mobility    Modified Rankin (Stroke Patients Only)       Balance Overall balance assessment: Needs assistance Sitting-balance support: Bilateral upper extremity supported Sitting balance-Leahy Scale: Fair Sitting balance - Comments: Pt demonstrates posterior lean while sitting EOB, and requires multiple vc's for anterior weight shift  Standing balance support: Bilateral upper extremity supported Standing balance-Leahy Scale: Fair                               Pertinent Vitals/Pain Pain Assessment: 0-10 Pain Score: 2  Pain Location: R ear    Home Living   Living Arrangements: Other relatives (brother - however pt initially states that she lives alone. ) Available Help at Discharge: Family;Available 24 hours/day;Personal care attendant (Pt has a sitter - Chrys Racer who assists with cooking and cleaning. ) Type of Home: House Home Access:  Stairs to enter Entrance Stairs-Rails: Right Entrance Stairs-Number of Steps: 1 Home Layout: Multi-level Home Equipment: None      Prior Function     Gait / Transfers Assistance Needed: Pt states she does not use any AD for ambulation  ADL's / Homemaking Assistance Needed: Baldo Ash, her sitter assists with cooking and cleaning.         Hand Dominance        Extremity/Trunk Assessment   Upper Extremity Assessment: Generalized weakness           Lower Extremity Assessment: Generalized weakness         Communication   Communication: Receptive difficulties (increased processing time. )  Cognition Arousal/Alertness: Lethargic (Needs multiple cues to keep eyes open, however, pt states that they are open) Behavior During Therapy: Flat affect;WFL for tasks assessed/performed Overall Cognitive Status: Impaired/Different from baseline Area of Impairment: Orientation;Memory;Safety/judgement;Awareness Orientation Level: Situation;Disoriented to   Memory: Decreased short-term memory;Decreased recall of precautions   Safety/Judgement: Decreased awareness of safety;Decreased awareness of deficits          General Comments      Exercises        Assessment/Plan    PT Assessment Patient needs continued PT services  PT Diagnosis Difficulty walking;Abnormality of gait;Generalized weakness;Altered mental status   PT Problem List Decreased strength;Decreased activity tolerance;Decreased balance;Decreased mobility;Decreased coordination;Decreased cognition;Decreased knowledge of use of DME;Decreased safety awareness;Decreased knowledge of precautions  PT Treatment Interventions DME instruction;Gait training;Functional mobility training;Therapeutic activities;Therapeutic exercise;Stair training;Balance training;Neuromuscular re-education;Cognitive remediation;Patient/family education   PT Goals (Current goals can be found in the Care Plan section) Acute Rehab PT Goals PT  Goal Formulation: Patient unable to participate in goal setting Time For Goal Achievement: 06/15/15 Potential to Achieve Goals: Good    Frequency Min 4X/week   Barriers to discharge        Co-evaluation               End of Session Equipment Utilized During Treatment: Gait belt Activity Tolerance: Patient tolerated treatment well Patient left: in chair;with call bell/phone within reach;with chair alarm set;with nursing/sitter in room Nurse Communication: Mobility status    Functional Assessment Tool Used: [employer] "6-clicks"  Functional Limitation: Mobility: Walking and moving around Mobility: Walking and Moving Around Current Status [phone]): At least 40 percent but less than 60 percent impaired, limited or restricted Mobility: Walking and Moving Around Goal Status [phone]): At least 20 percent but less than 40 percent impaired, limited or restricted    Time: [id number] PT Time Calculation (min) (ACUTE ONLY): 40 min   Charges:   PT Evaluation $PT Eval Moderate Complexity: 1 Procedure PT Treatments $Therapeutic Activity: 23-37 mins   PT G Codes:   PT G-Codes **NOT FOR INPATIENT CLASS** Functional Assessment Tool Used: [employer] "6-clicks"  Functional Limitation: Mobility: Walking and moving around Mobility: Walking and Moving Around Current Status [phone]): At  least 40 percent but less than 60 percent impaired, limited or restricted Mobility: Walking and Moving Around Goal Status [phone]): At least 20 percent but less than 40 percent impaired, limited or restricted    Tacy Learn, PT, DPT X: [id number]   06/08/2015, 4:18 PM

## 2015-06-08 NOTE — Progress Notes (Addendum)
[facility] Progress Note  Patient: Cassidy Smith [id number]   PCP: No PCP Per Patient DOB: 12-31-1955   DOA: 06/07/2015   DOS: 06/08/2015   Date of Service: the patient was seen and examined on 06/08/2015  Subjective: Patient has positive orthostasis in the morning. No other acute events identified overnight. Require sitter for safety. Patient herself denies having any in complaints of chest pain. Abdominal pain or nausea or vomiting. Occasional dizziness as complained no focal deficit. Nutrition: Tolerating oral diet  Brief hospital course: Patient with past medical history of bipolar disorder, CVA, questionable Parkinson, was admitted on 06/07/2015, with complaint of fall, was found to have rhabdomyolysis with elevated CPK as well as positive orthostasis. Currently further plan is continue IV hydration and further workup for acute encephalopathy.  Assessment and Plan: 1. Orthostatic hypotension Persistently orthostatic this morning. We will give normal saline bolus 500 mL's per hour and continue IV fluids. Also use Ted stocking as well as midodrine. Physical therapy consulted as well. This is likely in the setting of possible dehydration or autonomic dysfunction associated with patient's questionable Parkinson syndrome. Medication induced orthostatic hypotension can also be possible.  2. Acute encephalopathy. The patient presented with a fall. Unclear of the etiology of the fall as well as duration of the down time. Patient has sustained injury on her right eye. CT of the head, CT C-spine, CT maxillofacial is negative for any fracture or injury. Asterixis positive on examination. Patient is Disoriented this morning. Also has generalized weakness bilaterally and right lower leg paresthesia B-12 elevated, UDS negative, urine suspicious for UTI, LFT stable, alcohol, and Depakote level undetectable, TSH normal.  At present I would check MRI brain to rule out any acute  intracranial abnormality. We will hold and likely discontinue gabapentin with concern regarding metabolic encephalopathy from gabapentin causing asterixis or dizziness. EEG pending. If patient continues to remain encephalopathy may need to consider reducing the dose of trazodone and clonazepam.  3. Bipolar disorder. Continuing trazodone and clonazepam. Continue Depakote.  4. Rhabdomyolysis. From prolonged duration of immobilization. No acute injury identified. Significantly improving. Continue IV hydration.  5. Chronic thrombocytopenia. Continue monitoring. No evidence of active bleeding.  6. History of stroke. No evidence of acute abnormality at present. We'll continue aspirin.  7. Hyponatremia. Improving with hydration. We'll monitor.  Pain management: When necessary Tylenol Activity: Consulted physical therapy Bowel regimen: last BM MiraLAX added Diet: Regular diet DVT Prophylaxis: mechanical compression device  Advance goals of care discussion: Full code  Family Communication: no family was present at bedside, at the time of interview.   Disposition:  Discharge to home, likely with home health pending PT consult Expected discharge date: 06/10/2015 pending improvement in neuro status  Consultants: none Procedures: EEg  Antibiotics: Anti-infectives    Start     Dose/Rate Route Frequency Ordered Stop   06/08/15 1600  cefTRIAXone (ROCEPHIN) 1 g in dextrose 5 % 50 mL IVPB     1 g 100 mL/hr over 30 Minutes Intravenous Every 24 hours 06/07/15 1842     06/07/15 1530  cefTRIAXone (ROCEPHIN) 1 g in dextrose 5 % 50 mL IVPB     1 g 100 mL/hr over 30 Minutes Intravenous  Once 06/07/15 1525 06/07/15 1612        Intake/Output Summary (Last 24 hours) at 06/08/15 1449 Last data filed at 06/08/15 1300  Gross per 24 hour  Intake    480 ml  Output      0 ml  Net  480 ml   Filed Weights   06/07/15 1236  Weight: 54.432 kg (120 lb)    Objective: Physical  Exam: Filed Vitals:   06/07/15 1936 06/08/15 0035 06/08/15 0625 06/08/15 1413  BP:  164/82 153/100 152/105  Pulse:  66 66 70  Temp:  97.5 F (36.4 C) 97.8 F (36.6 C) 98 F (36.7 C)  TempSrc:  Oral Oral Oral  Resp:  23 22 22   Height:      Weight:      SpO2: 94% 99% 99% 99%    General: Alert, Awake and Oriented to Place and Person. Appear in mild distress Eyes: PERRL, Conjunctiva normal, right eye bruised ENT: Oral Mucosa clear moist. Neck: no JVD, no Abnormal Mass Or lumps Cardiovascular: S1 and S2 Present, no Murmur, Peripheral Pulses Present Respiratory: Bilateral Air entry equal and Decreased, Clear to Auscultation, no Crackles, no wheezes Abdomen: Bowel Sound present, Soft and no tenderness Skin: no redness, no Rash  Extremities: no Pedal edema, no calf tenderness Neurologic: Bilaterally Equal motor strength, Generalized weakness bilaterally and right lower leg paresthesia  Data Reviewed: CBC:  Recent Labs Lab 06/07/15 1240 06/08/15 0545  WBC 7.7 9.0  NEUTROABS 4.6  --   HGB 14.4 14.3  HCT 41.0 41.0  MCV 91.1 90.7  PLT 97* 99*   Basic Metabolic Panel:  Recent Labs Lab 06/07/15 1240 06/08/15 0545  NA 130* 132*  K 3.8 3.3*  CL 92* 96*  CO2 32 28  GLUCOSE 76 90  BUN 15 7  CREATININE 0.51 0.40*  CALCIUM 9.5 9.0    Liver Function Tests:  Recent Labs Lab 06/07/15 1240 06/08/15 0545  AST 67* 72*  ALT 24 30  ALKPHOS 63 60  BILITOT 0.3 0.5  PROT 6.5 6.1*  ALBUMIN 3.7 3.3*   No results for input(s): LIPASE, AMYLASE in the last 168 hours. No results for input(s): AMMONIA in the last 168 hours. Coagulation Profile: No results for input(s): INR, PROTIME in the last 168 hours. Cardiac Enzymes:  Recent Labs Lab 06/07/15 1240 06/08/15 0545  CKTOTAL 2798* 1844*  TROPONINI <0.03  --    BNP (last 3 results) No results for input(s): PROBNP in the last 8760 hours.  CBG: No results for input(s): GLUCAP in the last 168 hours.  Studies: No results  found.   Scheduled Meds: . aspirin EC  81 mg Oral Daily  . cefTRIAXone (ROCEPHIN)  IV  1 g Intravenous Q24H  . clonazePAM  0.5 mg Oral QHS  . divalproex  1,500 mg Oral QHS  . midodrine  5 mg Oral TID WC  . nicotine  21 mg Transdermal Daily  . pneumococcal 23 valent vaccine  0.5 mL Intramuscular Tomorrow-1000  . traZODone  200 mg Oral QHS   Continuous Infusions: . sodium chloride 150 mL/hr at 06/08/15 1124   PRN Meds: acetaminophen **OR** acetaminophen, LORazepam, ondansetron **OR** ondansetron (ZOFRAN) IV  Time spent: 30 minutes  Author: Berle Mull, MD [facility] Pager: [phone] 06/08/2015 2:49 PM  If 7PM-7AM, please contact night-coverage at [email or web address], password [facility]

## 2015-06-08 NOTE — Progress Notes (Signed)
EEG completed, results pending. 

## 2015-06-08 NOTE — Care Management Note (Signed)
Case Management Note  Patient Details  Name: Cassidy Smith MRN: [id number] Date of Birth: [birth year]  Subjective/Objective : Patient is from, lives with her brother. She states she is independent with ADL's. No needs for DME identified. States she has no trouble getting to appointments or obtaining meds.                    Action/Plan: No CM needs indentified at this time, will cont to follow.              Expected Discharge Date:       06/10/2015           Expected Discharge Plan:  Home/Self Care  In-House Referral:     Discharge planning Services  CM Consult  Post Acute Care Choice:    Choice offered to:     DME Arranged:    DME Agency:     HH Arranged:    HH Agency:     Status of Service:  In process, will continue to follow  Medicare Important Message Given:    Date Medicare IM Given:    Medicare IM give by:    Date Additional Medicare IM Given:    Additional Medicare Important Message give by:     If discussed at [place] of Stay Meetings, dates discussed:    Additional Comments:  Liza Czerwinski, Chauncey Reading, RN 06/08/2015, 1:21 PM

## 2015-06-09 DIAGNOSIS — M6282 Rhabdomyolysis: Secondary | ICD-10-CM

## 2015-06-09 LAB — COMPREHENSIVE METABOLIC PANEL
ALT: 23 U/L (ref 14–54)
AST: 49 U/L — AB (ref 15–41)
Albumin: 2.9 g/dL — ABNORMAL LOW (ref 3.5–5.0)
Alkaline Phosphatase: 53 U/L (ref 38–126)
Anion gap: 5 (ref 5–15)
BILIRUBIN TOTAL: 0.7 mg/dL (ref 0.3–1.2)
BUN: 7 mg/dL (ref 6–20)
CO2: 26 mmol/L (ref 22–32)
CREATININE: 0.39 mg/dL — AB (ref 0.44–1.00)
Calcium: 9.1 mg/dL (ref 8.9–10.3)
Chloride: 102 mmol/L (ref 101–111)
GFR calc Af Amer: >60 mL/min (ref >60)
Glucose, Bld: 76 mg/dL (ref 65–99)
POTASSIUM: 4.3 mmol/L (ref 3.5–5.1)
Sodium: 133 mmol/L — ABNORMAL LOW (ref 135–145)
TOTAL PROTEIN: 5.7 g/dL — AB (ref 6.5–8.1)

## 2015-06-09 LAB — CBC
HEMATOCRIT: 41.6 % (ref 36.0–46.0)
Hemoglobin: 14.4 g/dL (ref 12.0–15.0)
MCH: 31.5 pg (ref 26.0–34.0)
MCHC: 34.6 g/dL (ref 30.0–36.0)
MCV: 91 fL (ref 78.0–100.0)
Platelets: 89 10*3/uL — ABNORMAL LOW (ref 150–400)
RBC: 4.57 MIL/uL (ref 3.87–5.11)
RDW: 13.5 % (ref 11.5–15.5)
WBC: 10.2 10*3/uL (ref 4.0–10.5)

## 2015-06-09 LAB — CK: Total CK: 639 U/L — ABNORMAL HIGH (ref 38–234)

## 2015-06-09 LAB — MAGNESIUM: MAGNESIUM: 1.3 mg/dL — AB (ref 1.7–2.4)

## 2015-06-09 MED ORDER — RISPERIDONE 1 MG PO TABS
4.0000 mg | ORAL_TABLET | Freq: Every day | ORAL | Status: DC
  Administered 2015-06-09 – 2015-06-10 (×2): 4 mg via ORAL
  Filled 2015-06-09 (×2): qty 4

## 2015-06-09 MED ORDER — MAGNESIUM SULFATE 2 GM/50ML IV SOLN
2.0000 g | Freq: Once | INTRAVENOUS | Status: AC
  Administered 2015-06-09: 2 g via INTRAVENOUS
  Filled 2015-06-09: qty 50

## 2015-06-09 MED ORDER — BENZTROPINE MESYLATE 1 MG PO TABS
1.0000 mg | ORAL_TABLET | Freq: Two times a day (BID) | ORAL | Status: DC
  Administered 2015-06-09 – 2015-06-11 (×5): 1 mg via ORAL
  Filled 2015-06-09 (×5): qty 1

## 2015-06-09 MED ORDER — MIDODRINE HCL 5 MG PO TABS
10.0000 mg | ORAL_TABLET | Freq: Three times a day (TID) | ORAL | Status: DC
  Administered 2015-06-09 – 2015-06-11 (×7): 10 mg via ORAL
  Filled 2015-06-09 (×7): qty 2

## 2015-06-09 NOTE — Procedures (Signed)
  Cassidy A. Merlene Laughter, MD     [email or web address]           HISTORY: The patient is a 60 year old female who presents with confusion and altered mental status. This study is being done to evaluate for seizures as a cause of the confusion.  MEDICATIONS: Scheduled Meds: Continuous Infusions: PRN Meds:.  Prior to Admission medications   Medication Sig Start Date End Date Taking? Authorizing Provider  aspirin EC 81 MG EC tablet Take 1 tablet (81 mg total) by mouth daily. 05/15/13   Samuella Cota, MD  benztropine (COGENTIN) 1 MG tablet Take 1 mg by mouth 2 (two) times daily.    Historical Provider, MD  clonazePAM (KLONOPIN) 0.5 MG tablet Take 0.5 mg by mouth at bedtime.    Historical Provider, MD  divalproex (DEPAKOTE ER) 500 MG 24 hr tablet Take 1,500 mg by mouth at bedtime.     Historical Provider, MD  gabapentin (NEURONTIN) 300 MG capsule Take 300 mg by mouth 3 (three) times daily.    Historical Provider, MD  Multiple Vitamins-Minerals (CENTRUM SILVER ADULT 50+ PO) Take 1 tablet by mouth every morning.    Historical Provider, MD  naproxen (NAPROSYN) 500 MG tablet Take 500 mg by mouth 2 (two) times daily as needed for mild pain or moderate pain.    Historical Provider, MD  RISPERDAL CONSTA 50 MG injection Inject 50 mg into the muscle every 14 (fourteen) days. 10/24/14   Historical Provider, MD  risperidone (RISPERDAL) 4 MG tablet Take 1 tablet (4 mg total) by mouth at bedtime. 05/14/13   Samuella Cota, MD  traMADol (ULTRAM) 50 MG tablet Take 50-100 mg by mouth every 6 (six) hours as needed. For pain 05/22/15   Historical Provider, MD  traZODone (DESYREL) 100 MG tablet Take 200 mg by mouth at bedtime.    Historical Provider, MD  vitamin B-12 (CYANOCOBALAMIN) 1000 MCG tablet Take 1,000 mcg by mouth daily.    Historical Provider, MD      ANALYSIS: A 16 channel recording using standard 10 20 measurements is conducted for 21 minutes. The background activity is as high  as 8 Hz. There is beta activity observed in the frontal areas. The patient is noted to have some triphasic waves seen throughout the recording. There is also infrequent global delta slowing at 3-4 Hz. Photic stimulation and hyperventilation are not carried out. There is no focal or lateralized slowing. There is no epileptiform activity is observed.    IMPRESSION: This recording is abnormal for the following reasons: 1. Mild global slowing indicating a mild global encephalopathy.  2. Occasional triphasic waves which are seen in toxic metabolic encephalopathy especially due to renal failure and hepatic disease.       [name], M.D.  Diplomate, Tax adviser of Psychiatry and Neurology ( Neurology).

## 2015-06-09 NOTE — Progress Notes (Addendum)
[facility] Progress Note  Patient: Cassidy Smith [id number]   PCP: No PCP Per Patient DOB: [birth year]   DOA: 06/07/2015   DOS: 06/09/2015   Date of Service: the patient was seen and examined on 06/09/2015  Subjective: Continues to have orthostasis here in the morning. Denies any acute complaint at the time of my evaluation. Was confused as per Education officer, museum but on my evaluation oriented. No nausea no vomiting. Nutrition: Tolerating oral diet  Brief hospital course: Patient with past medical history of bipolar disorder, CVA, questionable Parkinson, was admitted on 06/07/2015, with complaint of fall, was found to have rhabdomyolysis with elevated CPK as well as positive orthostasis. Currently further plan is continue IV hydration and further workup for acute encephalopathy.  Assessment and Plan: 1. Orthostatic hypotension Persistently orthostatic this morning. We will continue IV fluids. Also use Ted stocking as well as midodrine. Increase the dose from 5 mg to 10 mg Physical therapy recommends SNF versus 24-hour supervision, discussed with family and the patient would like to go home with home health. This is likely in the setting of possible dehydration or autonomic dysfunction associated with patient's questionable Parkinson syndrome. Medication induced orthostatic hypotension can also be possible.  2. Acute encephalopathy. The patient presented with a fall. Unclear of the etiology of the fall as well as duration of the down time. Patient has sustained injury on her right eye. CT of the head, CT C-spine, CT maxillofacial is negative for any fracture or injury. MRI brain negative for any acute stroke or any other acute intracranial abnormality.  Asterixis positive on admission, currently resolved. Patient is oriented this morning.  B-12 elevated, UDS negative, urine suspicious for UTI, LFT stable, alcohol, and Depakote level normal, TSH normal.  We will hold and likely  discontinue gabapentin with concern regarding metabolic encephalopathy from gabapentin causing asterixis or dizziness. EEG pending. If patient continues to remain encephalopathy may need to consider reducing the dose of trazodone and clonazepam.   3. Bipolar disorder. Continuing trazodone and clonazepam. Continue Depakote. Resuming Cogentin as well as Risperdal.  4. Rhabdomyolysis. From prolonged duration of immobilization. No acute injury identified. Significantly improving. Continue IV hydration.  5. Escherichia coli UTI. Likely the cause of patient's current presentation. Sensitivities pending. Patient clinically improving. Continue ceftriaxone.  6. History of stroke. No evidence of acute abnormality at present. We'll continue aspirin.  7. Hyponatremia. Improving with hydration. We'll monitor.  8. Chronic thrombocytopenia. Continue monitoring. No evidence of active bleeding.  Pain management: When necessary Tylenol Activity: Consulted physical therapy Bowel regimen: last BM MiraLAX added Diet: Regular diet DVT Prophylaxis: mechanical compression device  Advance goals of care discussion: Full code  Family Communication:  The pt provided permission to discuss medical plan with the family. Discussed this patient brother on the phone. Opportunity was given to ask question and all questions were answered satisfactorily. .   Disposition:  Discharge to home, likely with home health  Expected discharge date: 06/10/2015 pending improvement in neuro status, EEG results  Consultants: none Procedures: EEg  Antibiotics: Anti-infectives    Start     Dose/Rate Route Frequency Ordered Stop   06/08/15 1600  cefTRIAXone (ROCEPHIN) 1 g in dextrose 5 % 50 mL IVPB     1 g 100 mL/hr over 30 Minutes Intravenous Every 24 hours 06/07/15 1842     06/07/15 1530  cefTRIAXone (ROCEPHIN) 1 g in dextrose 5 % 50 mL IVPB     1 g 100 mL/hr over 30 Minutes Intravenous  Once 06/07/15 1525  06/07/15 1612        Intake/Output Summary (Last 24 hours) at 06/09/15 1131 Last data filed at 06/09/15 0650  Gross per 24 hour  Intake   3965 ml  Output      0 ml  Net   3965 ml   Filed Weights   06/07/15 1236  Weight: 54.432 kg (120 lb)    Objective: Physical Exam: Filed Vitals:   06/08/15 2016 06/09/15 0143 06/09/15 0756 06/09/15 1030  BP: 174/77 175/86  155/64  Pulse: 56 57 64 64  Temp: 98.2 F (36.8 C) 98.1 F (36.7 C) 97.9 F (36.6 C)   TempSrc: Oral Oral Oral   Resp: 20 20 20    Height:      Weight:      SpO2: 98% 99% 99%    General: Alert, Awake and Oriented to Place and Person. Appear in mild distress Eyes: PERRL, Conjunctiva normal, right eye bruised ENT: Oral Mucosa clear moist. Neck: no JVD, no Abnormal Mass Or lumps Cardiovascular: S1 and S2 Present, no Murmur, Peripheral Pulses Present Respiratory: Bilateral Air entry equal and Decreased, Clear to Auscultation, no Crackles, no wheezes Abdomen: Bowel Sound present, Soft and no tenderness Skin: no redness, no Rash  Extremities: no Pedal edema, no calf tenderness Neurologic: Bilaterally Equal motor strength, Generalized weakness bilaterally  Data Reviewed: CBC:  Recent Labs Lab 06/07/15 1240 06/08/15 0545 06/09/15 0415  WBC 7.7 9.0 10.2  NEUTROABS 4.6  --   --   HGB 14.4 14.3 14.4  HCT 41.0 41.0 41.6  MCV 91.1 90.7 91.0  PLT 97* 99* 89*   Basic Metabolic Panel:  Recent Labs Lab 06/07/15 1240 06/08/15 0545 06/09/15 0415  NA 130* 132* 133*  K 3.8 3.3* 4.3  CL 92* 96* 102  CO2 32 28 26  GLUCOSE 76 90 76  BUN 15 7 7   CREATININE 0.51 0.40* 0.39*  CALCIUM 9.5 9.0 9.1  MG  --   --  1.3*    Liver Function Tests:  Recent Labs Lab 06/07/15 1240 06/08/15 0545 06/09/15 0415  AST 67* 72* 49*  ALT 24 30 23   ALKPHOS 63 60 53  BILITOT 0.3 0.5 0.7  PROT 6.5 6.1* 5.7*  ALBUMIN 3.7 3.3* 2.9*   No results for input(s): LIPASE, AMYLASE in the last 168 hours. No results for input(s):  AMMONIA in the last 168 hours. Coagulation Profile: No results for input(s): INR, PROTIME in the last 168 hours. Cardiac Enzymes:  Recent Labs Lab 06/07/15 1240 06/08/15 0545 06/09/15 0415  CKTOTAL 2798* 1844* 639*  TROPONINI <0.03  --   --    BNP (last 3 results) No results for input(s): PROBNP in the last 8760 hours.  CBG: No results for input(s): GLUCAP in the last 168 hours.  Studies: Mr Brain Wo Contrast  06/08/2015  CLINICAL DATA:  Recent fall out of bed at home with facial/head injury. Acute encephalopathy. Paresthesia. Initial encounter. EXAM: MRI HEAD WITHOUT CONTRAST TECHNIQUE: Multiplanar, multiecho pulse sequences of the brain and surrounding structures were obtained without intravenous contrast. COMPARISON:  Head CT 06/07/2015 and MRI 05/12/2013 FINDINGS: No acute infarct is identified. A subcentimeter focus of increased trace diffusion signal in the white matter along the posterior margin of the body of the left lateral ventricle is unchanged from the prior MRI and without reduced ADC. No intracranial hemorrhage, mass, midline shift, or extra-axial fluid collection is identified. Lateral and third ventricular enlargement is unchanged from the prior MRI and may  reflect central predominant cerebral atrophy of a moderate degree. Patchy T2 hyperintensities in the cerebral white matter are unchanged and nonspecific but compatible with moderate chronic small vessel ischemic disease. A small, chronic infarct is again seen in the superior right cerebellum. There is also a punctate, chronic white matter infarct just posterior to the left insula. Orbits are unremarkable. Paranasal sinuses and mastoid air cells are clear. Major intracranial vascular flow voids are preserved. Right periorbital soft tissue swelling is again noted. IMPRESSION: 1. No acute intracranial abnormality. 2. Moderate chronic small vessel ischemic disease and cerebral atrophy. Chronic right cerebellar infarct.  Electronically Signed   By: Logan Bores M.D.   On: 06/08/2015 18:04     Scheduled Meds: . aspirin EC  81 mg Oral Daily  . benztropine  1 mg Oral BID  . cefTRIAXone (ROCEPHIN)  IV  1 g Intravenous Q24H  . clonazePAM  0.5 mg Oral QHS  . divalproex  1,500 mg Oral QHS  . midodrine  10 mg Oral TID WC  . nicotine  21 mg Transdermal Daily  . pneumococcal 23 valent vaccine  0.5 mL Intramuscular Tomorrow-1000  . polyethylene glycol  17 g Oral Daily  . risperidone  4 mg Oral QHS  . traZODone  200 mg Oral QHS   Continuous Infusions: . sodium chloride 150 mL/hr at 06/09/15 1026   PRN Meds: acetaminophen **OR** acetaminophen, LORazepam, ondansetron **OR** ondansetron (ZOFRAN) IV  Time spent: 30 minutes  Author: Berle Mull, MD [facility] Pager: [phone] 06/09/2015 11:31 AM  If 7PM-7AM, please contact night-coverage at [email or web address], password [facility]

## 2015-06-09 NOTE — Progress Notes (Signed)
Paged hospitalist about patients blood pressure of 175/86. Spoke with hospitalist. No new orders at this time. Will continue to monitor.

## 2015-06-09 NOTE — Care Management Note (Signed)
Case Management Note  Patient Details  Name: Cassidy Smith MRN: [id number] Date of Birth: May 29, 1955   Expected Discharge Date:                  Expected Discharge Plan:  [place]  In-House Referral:     Discharge planning Services  CM Consult  Post Acute Care Choice:  Home Health Choice offered to:  Patient  DME Arranged:    DME Agency:     HH Arranged:  RN [place] Agency:  [place]  Status of Service:  Completed, signed off  Medicare Important Message Given:    Date Medicare IM Given:    Medicare IM give by:    Date Additional Medicare IM Given:    Additional Medicare Important Message give by:     If discussed at [place] of Stay Meetings, dates discussed:    Additional Comments: Home health has been recommended by physical therapy. Patient has agreed to [facility] RN. She was presented with choice. She chose [facility]. Juliann Pulse with [facility] has been notified and will obtain information from chart. Patient has been made aware that [facility] has 48 hours to make first visit.   Cassidy Smith, Chauncey Reading, RN 06/09/2015, 1:52 PM

## 2015-06-09 NOTE — Clinical Social Work Note (Signed)
Clinical Social Work Assessment  Patient Details  Name: Cassidy Smith MRN: [id number] Date of Birth: 18-Jan-1955  Date of referral:  06/09/15               Reason for consult:  Discharge Planning                Permission sought to share information with:  Family Supports Permission granted to share information::  Yes, Verbal Permission Granted  Name::     Cassidy Smith::     Relationship::  brother  Contact Information:     Housing/Transportation Living arrangements for the past 2 months:  Single Family Home Source of Information:  Patient, Other (Comment Required) (Brother) Patient Interpreter Needed:  None Criminal Activity/Legal Involvement Pertinent to Current Situation/Hospitalization:  No - Comment as needed Significant Relationships:  Siblings Lives with:  Siblings Do you feel safe going back to the place where you live?  Yes Need for family participation in patient care:  Yes (Comment)  Care giving concerns:  Pt may require more assist at d/c than baseline.    Social Worker assessment / plan:  CSW met with pt at bedside. Pt alert, but oriented to self and place only. She did give permission for CSW to speak with her brother, Cassidy Smith. Cassidy Smith reports pt has lived with him for the past 11 years. He states that she just started receiving CAP aid services about 2 weeks ago. Pt has CAP aid Monday through Friday for 6 hours a day. Cassidy Smith shared that this has helped a lot at home as he was having difficulty caring for her on his own. CSW discussed PT evaluation recommendations of home health vs SNF. Cassidy Smith said that pt is very against idea of placement and he feels home health would be best option. He feels that the most difficult issue they have at home is getting pt to take meds. She has a diagnosis of Bipolar and sees psychiatrist just several times a year for med management. Pt has refused a therapist. Plan at this point is to return home with home health. CM notified. CSW will  sign off, but can be reconsulted if needed.   Employment status:    Insurance information:  Medicaid In [place] PT Recommendations:  Home with [place], [place], [place] / Referral to community resources:  Other (Comment Required) (Discussed SNF)  Patient/Family's Response to care:  Pt's brother indicates he feels it is family's responsibility to care for family and requests that pt return home.   Patient/Family's Understanding of and Emotional Response to Diagnosis, Current Treatment, and Prognosis:  Pt's brother requests to speak to MD today regarding findings. Will notify MD.   Emotional Assessment Appearance:  Appears stated age Attitude/Demeanor/Rapport:  Other (Pleasant) Affect (typically observed):  Appropriate Orientation:  Oriented to Self, Oriented to Place Alcohol / Substance use:  Not Applicable Psych involvement (Current and /or in the community):  No (Comment)  Discharge Needs  Concerns to be addressed:  Discharge Planning Concerns Readmission within the last 30 days:  No Current discharge risk:  Physical Impairment Barriers to Discharge:  No Barriers Identified   Cassidy Smith, [place] 06/09/2015, 9:31 AM [phone]

## 2015-06-10 LAB — BASIC METABOLIC PANEL
Anion gap: 6 (ref 5–15)
BUN: 8 mg/dL (ref 6–20)
CHLORIDE: 98 mmol/L — AB (ref 101–111)
CO2: 28 mmol/L (ref 22–32)
CREATININE: 0.44 mg/dL (ref 0.44–1.00)
Calcium: 9.3 mg/dL (ref 8.9–10.3)
GFR calc Af Amer: >60 mL/min (ref >60)
GFR calc non Af Amer: >60 mL/min (ref >60)
GLUCOSE: 86 mg/dL (ref 65–99)
POTASSIUM: 3.8 mmol/L (ref 3.5–5.1)
Sodium: 132 mmol/L — ABNORMAL LOW (ref 135–145)

## 2015-06-10 LAB — CK: Total CK: 280 U/L — ABNORMAL HIGH (ref 38–234)

## 2015-06-10 LAB — URINE CULTURE: Culture: >=100000 — AB

## 2015-06-10 MED ORDER — SODIUM CHLORIDE 0.9 % IV BOLUS (SEPSIS)
500.0000 mL | Freq: Once | INTRAVENOUS | Status: AC
  Administered 2015-06-10: 500 mL via INTRAVENOUS

## 2015-06-10 MED ORDER — CLONAZEPAM 0.5 MG PO TABS: 0.5000 mg | ORAL_TABLET | Freq: Every evening | ORAL | Status: DC | PRN

## 2015-06-10 MED ORDER — SENNOSIDES-DOCUSATE SODIUM 8.6-50 MG PO TABS
1.0000 | ORAL_TABLET | Freq: Two times a day (BID) | ORAL | Status: DC
  Administered 2015-06-10 – 2015-06-11 (×3): 1 via ORAL
  Filled 2015-06-10 (×3): qty 1

## 2015-06-10 MED ORDER — CEPHALEXIN 500 MG PO CAPS
500.0000 mg | ORAL_CAPSULE | Freq: Two times a day (BID) | ORAL | Status: DC
  Administered 2015-06-10 – 2015-06-11 (×3): 500 mg via ORAL
  Filled 2015-06-10 (×4): qty 1

## 2015-06-10 NOTE — Progress Notes (Signed)
[facility] Progress Note  Patient: Cassidy Smith [id number]   PCP: No PCP Per Patient DOB: [id number]   DOA: 06/07/2015   DOS: 06/10/2015   Date of Service: the patient was seen and examined on 06/10/2015  Subjective: No acute events identified overnight. Patient is oriented to time place and person. Answering questions appropriately. Denies any acute complaint. Nutrition: Tolerating oral diet  Brief hospital course: Patient with past medical history of bipolar disorder, CVA, questionable Parkinson, was admitted on 06/07/2015, with complaint of fall, was found to have rhabdomyolysis with elevated CPK as well as positive orthostasis. Currently further plan is continue IV hydration and further workup for acute encephalopathy.  Assessment and Plan: 1. Orthostatic hypotension Persistently orthostatic this morning. We will continue IV fluids. Also use Ted stocking as well as midodrine. Increase the dose from 5 mg to 10 mg Physical therapy recommends SNF versus 24-hour supervision, discussed with family and the patient would like to go home with home health. This is likely in the setting of possible dehydration or medication induced orthostatic hypotension-Due to risperidone  2. Acute encephalopathy. Resolved The patient presented with a fall. Unclear of the etiology of the fall as well as duration of the down time. Patient has sustained injury on her right eye. CT of the head, CT C-spine, CT maxillofacial is negative for any fracture or injury. MRI brain negative for any acute stroke or any other acute intracranial abnormality.  Asterixis positive on admission, currently resolved. Patient remains oriented this morning.  B-12 elevated, UDS negative, urine suspicious for UTI, LFT stable, alcohol, and Depakote level normal, TSH normal.  We will hold and likely discontinue gabapentin with concern regarding metabolic encephalopathy from gabapentin causing asterixis or dizziness. EEG  pending. Reduced the dose of clonazepam from scheduled to as needed.  3. Bipolar disorder. Continuing trazodone and change clonazepam from scheduled to as needed. Continue Depakote. Resuming Cogentin as well as Risperdal.  4. Rhabdomyolysis. From prolonged duration of immobilization. No acute injury identified. Significantly improving. Continue IV hydration.  5. Escherichia coli UTI. Likely the cause of patient's current presentation. Pansensitive, changed to Keflex Patient clinically improving.  6. History of stroke. No evidence of acute abnormality at present. We'll continue aspirin.  7. Hyponatremia. Resolved  8. Chronic thrombocytopenia. Stable No evidence of active bleeding.  Pain management: When necessary Tylenol Activity: Home health per physical therapy Bowel regimen: last BM prior to admission MiraLAX and Senokot added Diet: Regular diet DVT Prophylaxis: mechanical compression device  Advance goals of care discussion: Full code  Family Communication:  No family was able to bedside  Disposition:  Discharge to home, likely with home health  Expected discharge date: 06/11/2015 pending improvement in orthostasis, EEG results  Consultants: none Procedures: EEg  Antibiotics: Anti-infectives    Start     Dose/Rate Route Frequency Ordered Stop   06/10/15 1000  cephALEXin (KEFLEX) capsule 500 mg     500 mg Oral Every 12 hours 06/10/15 0832     06/08/15 1600  cefTRIAXone (ROCEPHIN) 1 g in dextrose 5 % 50 mL IVPB  Status:  Discontinued     1 g 100 mL/hr over 30 Minutes Intravenous Every 24 hours 06/07/15 1842 06/10/15 0832   06/07/15 1530  cefTRIAXone (ROCEPHIN) 1 g in dextrose 5 % 50 mL IVPB     1 g 100 mL/hr over 30 Minutes Intravenous  Once 06/07/15 1525 06/07/15 1612        Intake/Output Summary (Last 24 hours) at 06/10/15 0936 Last  data filed at 06/09/15 1800  Gross per 24 hour  Intake   2255 ml  Output      0 ml  Net   2255 ml   Filed Weights     06/07/15 1236  Weight: 54.432 kg (120 lb)    Objective: Physical Exam: Filed Vitals:   06/09/15 1030 06/09/15 1256 06/09/15 2245 06/10/15 0557  BP: 155/64 186/76 167/82   Pulse: 64 60 58   Temp:   97.9 F (36.6 C) 98.7 F (37.1 C)  TempSrc:   Oral   Resp:  20 20   Height:      Weight:      SpO2:  99% 99% 99%   General: Alert, Awake and Oriented to Place and Person. Appear in mild distress Eyes: PERRL, Conjunctiva normal, right eye bruised ENT: Oral Mucosa clear moist. Neck: no JVD, no Abnormal Mass Or lumps Cardiovascular: S1 and S2 Present, no Murmur, Peripheral Pulses Present Respiratory: Bilateral Air entry equal and Decreased, Clear to Auscultation, no Crackles, no wheezes Abdomen: Bowel Sound present, Soft and no tenderness Skin: no redness, no Rash  Extremities: no Pedal edema, no calf tenderness Neurologic: Bilaterally Equal motor strength, Generalized weakness bilaterally  Data Reviewed: CBC:  Recent Labs Lab 06/07/15 1240 06/08/15 0545 06/09/15 0415  WBC 7.7 9.0 10.2  NEUTROABS 4.6  --   --   HGB 14.4 14.3 14.4  HCT 41.0 41.0 41.6  MCV 91.1 90.7 91.0  PLT 97* 99* 89*   Basic Metabolic Panel:  Recent Labs Lab 06/07/15 1240 06/08/15 0545 06/09/15 0415 06/10/15 0610  NA 130* 132* 133* 132*  K 3.8 3.3* 4.3 3.8  CL 92* 96* 102 98*  CO2 32 28 26 28   GLUCOSE 76 90 76 86  BUN 15 7 7 8   CREATININE 0.51 0.40* 0.39* 0.44  CALCIUM 9.5 9.0 9.1 9.3  MG  --   --  1.3*  --     Liver Function Tests:  Recent Labs Lab 06/07/15 1240 06/08/15 0545 06/09/15 0415  AST 67* 72* 49*  ALT 24 30 23   ALKPHOS 63 60 53  BILITOT 0.3 0.5 0.7  PROT 6.5 6.1* 5.7*  ALBUMIN 3.7 3.3* 2.9*   No results for input(s): LIPASE, AMYLASE in the last 168 hours. No results for input(s): AMMONIA in the last 168 hours. Coagulation Profile: No results for input(s): INR, PROTIME in the last 168 hours. Cardiac Enzymes:  Recent Labs Lab 06/07/15 1240 06/08/15 0545  06/09/15 0415 06/10/15 0610  CKTOTAL 2798* 1844* 639* 280*  TROPONINI <0.03  --   --   --    BNP (last 3 results) No results for input(s): PROBNP in the last 8760 hours.  CBG: No results for input(s): GLUCAP in the last 168 hours.  Studies: No results found.   Scheduled Meds: . aspirin EC  81 mg Oral Daily  . benztropine  1 mg Oral BID  . cephALEXin  500 mg Oral Q12H  . divalproex  1,500 mg Oral QHS  . midodrine  10 mg Oral TID WC  . nicotine  21 mg Transdermal Daily  . pneumococcal 23 valent vaccine  0.5 mL Intramuscular Tomorrow-1000  . polyethylene glycol  17 g Oral Daily  . risperidone  4 mg Oral QHS  . traZODone  200 mg Oral QHS   Continuous Infusions: . sodium chloride 100 mL/hr at 06/10/15 0857   PRN Meds: acetaminophen **OR** acetaminophen, clonazePAM, ondansetron **OR** ondansetron (ZOFRAN) IV  Time spent: 30 minutes  Author: Berle Mull, MD [facility] Pager: [phone] 06/10/2015 9:36 AM  If 7PM-7AM, please contact night-coverage at [email or web address], password [facility]

## 2015-06-11 LAB — BASIC METABOLIC PANEL
Anion gap: 5 (ref 5–15)
BUN: 7 mg/dL (ref 6–20)
CHLORIDE: 99 mmol/L — AB (ref 101–111)
CO2: 31 mmol/L (ref 22–32)
Calcium: 9.1 mg/dL (ref 8.9–10.3)
Creatinine, Ser: 0.42 mg/dL — ABNORMAL LOW (ref 0.44–1.00)
Glucose, Bld: 79 mg/dL (ref 65–99)
POTASSIUM: 3.6 mmol/L (ref 3.5–5.1)
SODIUM: 135 mmol/L (ref 135–145)

## 2015-06-11 LAB — AMMONIA: AMMONIA: 15 umol/L (ref 9–35)

## 2015-06-11 LAB — CK: Total CK: 138 U/L (ref 38–234)

## 2015-06-11 MED ORDER — POLYETHYLENE GLYCOL 3350 17 G PO PACK: 17.0000 g | PACK | Freq: Every day | ORAL | Status: DC

## 2015-06-11 MED ORDER — CEPHALEXIN 500 MG PO CAPS: 500.0000 mg | ORAL_CAPSULE | Freq: Two times a day (BID) | ORAL | Status: AC

## 2015-06-11 MED ORDER — MIDODRINE HCL 10 MG PO TABS: 10.0000 mg | ORAL_TABLET | Freq: Three times a day (TID) | ORAL | Status: DC

## 2015-06-11 MED ORDER — NICOTINE 21 MG/24HR TD PT24: 21.0000 mg | MEDICATED_PATCH | Freq: Every day | TRANSDERMAL | Status: DC

## 2015-06-11 MED ORDER — RISPERIDONE 3 MG PO TABS: 3.0000 mg | ORAL_TABLET | Freq: Every day | ORAL | Status: DC

## 2015-06-11 NOTE — Progress Notes (Signed)
Pt IV and telemetry removed for discharge, tolerated well.

## 2015-06-11 NOTE — Progress Notes (Signed)
Reviewed discharge instructions with pt and brother.  Answered all questions at this time.  Pt is discharging home with brother and home health.

## 2015-06-11 NOTE — Progress Notes (Signed)
Notified brother of discharge, he will pick up pt.   [place] order and Face to Face to [facility] and called to notify them of pt discharge.

## 2015-06-13 NOTE — Discharge Summary (Signed)
[facility] Discharge Summary   Patient: Cassidy Smith [id number]   PCP: No PCP Per Patient DOB: 08/23/1955   Date of admission: 06/07/2015   Date of discharge: 06/11/2015     Discharge Diagnoses:  Principal Problem:   Orthostatic hypotension Active Problems:   Hyponatremia   History of depression   Hx of anxiety disorder   Bipolar 1 disorder (HCC)   UTI (urinary tract infection)   Rhabdomyolysis  Recommendations for Outpatient Follow-up:  1. Please follow-up with PCP in one week. 2. Please call Dr. Merlene Laughter for follow-up. 3. Please follow-up with psychiatrist in 1-2 weeks regarding adjustment of psychotropic medications.  Follow-up Information    Schedule an appointment as soon as possible for a visit in 1 week to follow up.   Why:  with PCP      Follow up with [facility], KOFI, MD. Call in 1 week.   Specialty:  Neurology   Why:  for follow up   Contact information:   [address] [place] [address] [address] [phone]       Call in 1 week to follow up.   Why:  for follow up Dr Dr. Irven Shelling, Address: [address] [place], [place] [address], Phone: [phone]     Diet recommendation: Regular diet encourage fluids  Activity: The patient is advised to gradually reintroduce usual activities.  Discharge Condition: fair  History of present illness: As per the H and P dictated on admission, "Cassidy Smith is a 60 y.o. female,With history of bipolar disorder, anxiety, remote stroke, chronic hyponatremia and? Parkinson's who presents from home with a fall. History is very limited and obtained from EMS information and ED physician. Patient is a very poor historian. He reports that she fell at home last evening and was on the floor for several hours until this morning. She is not able to provide much detail. Denies sustaining head injury or any other injury. Denies witnessed seizures, bowel or urinary incontinence. Denies nausea or vomiting, fevers, chills, chest pain,  palpitations, shortness of breath, headache, blurred vision, focal weakness or numbness. Denies bowel or urinary symptoms. She reports being on medications for anxiety and bipolar disorder and seeing a psychiatrist. Reports also seeing Dr. Molli Barrows the past for? Gait abnormality. she was hospitalized 2 years back for chronic hyponatremia with findings of extensive white matter chronic lesions and suspicion for Parkinson's disease. Patient denies smoking, alcohol use or illicit drug use. Denies any physical abuse."  Hospital Course:  Summary of her active problems in the hospital is as following. 1. Orthostatic hypotension Improving. Patient was given aggressive IV fluids in the hospital with improvement in the vitals as well as resolution of the symptoms. Also use Ted stocking as well as midodrine. Continue 10 mg 3 times a day Physical therapy recommends SNF versus 24-hour supervision, discussed with family and the patient would like to go home with home health.  This is likely in the setting of possible dehydration or medication induced orthostatic hypotension-Due to risperidone  2. Acute encephalopathy. Resolved The patient presented with a fall. Unclear of the etiology of the fall as well as duration of the down time. Patient has sustained injury on her right eye. CT of the head, CT C-spine, CT maxillofacial is negative for any fracture or injury. MRI brain negative for any acute stroke or any other acute intracranial abnormality.  Asterixis positive on admission, currently resolved. B-12 elevated, UDS negative, urine suspicious for UTI, LFT stable, alcohol, and Depakote level normal, TSH  normal. Ammonia level negative  We will hold and likely discontinue gabapentin with concern regarding metabolic encephalopathy from gabapentin causing asterixis or dizziness. EEG normal.  3. Bipolar disorder. Continuing trazodone and change clonazepam from scheduled to as needed. Continue  Depakote. Resuming Cogentin as well as Risperdal. I recommend patient to follow-up with psychiatrist to adjust dose of risperidone.  4. Rhabdomyolysis. From prolonged duration of immobilization. No acute injury identified. Resolved. Recommend to take oral fluids at home.  5. Escherichia coli UTI. Likely the cause of patient's current presentation. Pansensitive, changed to Keflex Patient clinically improving.  6. Parkinson's disease. Patient has tremors with memory issues and bradykinesia concerning for Parkinson's disease. Discussed with neurology and patient will follow-up with neurology clinic as an outpatient. This could be primary Parkinson disease or secondary due to risperidone. Therefore patient should follow up with psychiatrist to adjust the doses of risperidone.   7. History of stroke. No evidence of acute abnormality at present. We'll continue aspirin.  8. Hyponatremia. Resolved  9. Chronic thrombocytopenia. Stable No evidence of active bleeding.  All other chronic medical condition were stable during the hospitalization.  Patient was seen by physical therapy, who recommended SNF patient's family preferred to go home with home health, which was arranged by Education officer, museum and case Freight forwarder. On the day of the discharge the patient's orthostatic vitals improving, and no other acute medical condition were reported by patient. the patient was felt safe to be discharge at home with home health.  Procedures and Results:  EEG   Consultations:  None  DISCHARGE MEDICATION: Discharge Medication List as of 06/11/2015 11:48 AM    START taking these medications   Details  cephALEXin (KEFLEX) 500 MG capsule Take 1 capsule (500 mg total) by mouth every 12 (twelve) hours., Starting 06/11/2015, Until Tue 06/13/15, Print    midodrine (PROAMATINE) 10 MG tablet Take 1 tablet (10 mg total) by mouth 3 (three) times daily with meals., Starting 06/11/2015, Until Discontinued, Print     nicotine (NICODERM CQ - DOSED IN MG/24 HOURS) 21 mg/24hr patch Place 1 patch (21 mg total) onto the skin daily., Starting 06/11/2015, Until Discontinued, Normal    polyethylene glycol (MIRALAX / GLYCOLAX) packet Take 17 g by mouth daily., Starting 06/11/2015, Until Discontinued, Normal      CONTINUE these medications which have CHANGED   Details  risperidone (RISPERDAL) 3 MG tablet Take 1 tablet (3 mg total) by mouth at bedtime., Starting 06/11/2015, Until Discontinued, Print      CONTINUE these medications which have NOT CHANGED   Details  aspirin EC 81 MG EC tablet Take 1 tablet (81 mg total) by mouth daily., Starting 05/15/2013, Until Discontinued, No Print    benztropine (COGENTIN) 1 MG tablet Take 1 mg by mouth 2 (two) times daily., Until Discontinued, Historical Med    clonazePAM (KLONOPIN) 0.5 MG tablet Take 0.5 mg by mouth at bedtime., Until Discontinued, Historical Med    divalproex (DEPAKOTE ER) 500 MG 24 hr tablet Take 1,500 mg by mouth at bedtime. , Until Discontinued, Historical Med    Multiple Vitamins-Minerals (CENTRUM SILVER ADULT 50+ PO) Take 1 tablet by mouth every morning., Until Discontinued, Historical Med    naproxen (NAPROSYN) 500 MG tablet Take 500 mg by mouth 2 (two) times daily as needed for mild pain or moderate pain., Until Discontinued, Historical Med    RISPERDAL CONSTA 50 MG injection Inject 50 mg into the muscle every 14 (fourteen) days., Starting 10/24/2014, Until Discontinued, Historical Med  traZODone (DESYREL) 100 MG tablet Take 200 mg by mouth at bedtime., Until Discontinued, Historical Med    vitamin B-12 (CYANOCOBALAMIN) 1000 MCG tablet Take 1,000 mcg by mouth daily., Until Discontinued, Historical Med      STOP taking these medications     gabapentin (NEURONTIN) 300 MG capsule      traMADol (ULTRAM) 50 MG tablet        No Known Allergies Discharge Instructions    Diet general    Complete by:  As directed      Discharge instructions     Complete by:  As directed   It is important that you read following instructions as well as go over your medication list with RN to help you understand your care after this hospitalization.  Discharge Instructions: Please follow-up with PCP in one week  Please request your primary care physician to go over all Hospital Tests and Procedure/Radiological results at the follow up,  Please get all Hospital records sent to your PCP by signing hospital release before you go home.   Do not drive, operating heavy machinery, perform activities at heights, swimming or participation in water activities or provide baby sitting services; until you have been seen by Primary Care Physician or a Neurologist and advised to do so again. Do not take more than prescribed Pain, Sleep and Anxiety Medications. You were cared for by a hospitalist during your hospital stay. If you have any questions about your discharge medications or the care you received while you were in the hospital after you are discharged, you can call the unit and ask to speak with the hospitalist on call if the hospitalist that took care of you is not available.  Once you are discharged, your primary care physician will handle any further medical issues. Please note that NO REFILLS for any discharge medications will be authorized once you are discharged, as it is imperative that you return to your primary care physician (or establish a relationship with a primary care physician if you do not have one) for your aftercare needs so that they can reassess your need for medications and monitor your lab values. You Must read complete instructions/literature along with all the possible adverse reactions/side effects for all the Medicines you take and that have been prescribed to you. Take any new Medicines after you have completely understood and accept all the possible adverse reactions/side effects. Wear Seat belts while driving. If you have smoked or  chewed Tobacco in the last 2 yrs please stop smoking and/or stop any Recreational drug use.     Encourage fluids    Complete by:  As directed      Increase activity slowly    Complete by:  As directed           Discharge Exam: Filed Weights   06/07/15 1236  Weight: 54.432 kg (120 lb)   Filed Vitals:   06/10/15 2126 06/11/15 0648  BP: 150/69 162/84  Pulse: 56 55  Temp: 98.1 F (36.7 C) 97.5 F (36.4 C)  Resp: 20 20   General: Appear in no distress, n Rash; Oral Mucosa moist. Cardiovascular: S1 and S2 Present, no Murmur, no JVD Respiratory: Bilateral Air entry present and Clear to Auscultation, no Crackles, no wheezes Abdomen: Bowel Sound present, Soft and no tenderness Extremities: on Pedal edema, no calf tenderness Neurology: Grossly no focal neuro deficit.  The results of significant diagnostics from this hospitalization (including imaging, microbiology, ancillary and laboratory) are listed below for reference.  Significant Diagnostic Studies: Dg Chest 2 View  06/07/2015  CLINICAL DATA:  Status post fall last night.  Headache. EXAM: CHEST  2 VIEW COMPARISON:  None. FINDINGS: The heart size and mediastinal contours are within normal limits. Both lungs are clear. The visualized skeletal structures are unremarkable. IMPRESSION: No active cardiopulmonary disease. Electronically Signed   By: Kathreen Devoid   On: 06/07/2015 13:28   Ct Head Wo Contrast  06/07/2015  CLINICAL DATA:  [place] out of bed last night striking side of head, headache, slowly answer questions, LEFT facial swelling, smoker EXAM: CT HEAD WITHOUT CONTRAST CT MAXILLOFACIAL WITHOUT CONTRAST CT CERVICAL SPINE WITHOUT CONTRAST TECHNIQUE: Multidetector CT imaging of the head, cervical spine, and maxillofacial structures were performed using the standard protocol without intravenous contrast. Multiplanar CT image reconstructions of the cervical spine and maxillofacial structures were also generated. Right side of face  marked with BB. COMPARISON:  None FINDINGS: CT HEAD FINDINGS Minimal scattered motion artifacts. Normal ventricular morphology. No midline shift or mass effect. No definite intracranial hemorrhage, mass lesion or evidence acute infarction. Minimal periventricular white matter hypoattenuation potentially small vessel chronic ischemic changes. No extra-axial fluid collections. Calvaria intact. CT MAXILLOFACIAL FINDINGS Visualized intracranial structures unremarkable. Intra orbital fat planes clear. Nasal septal deviation to the LEFT. Paranasal sinuses, mastoid air cells and and middle ear cavities clear. Periorbital soft tissue swelling RIGHT greater than LEFT. No definite facial bone fractures identified. CT CERVICAL SPINE FINDINGS Mild diffuse osseous demineralization. Prevertebral soft tissues normal thickness. Disc space narrowing and endplate spur formation C6-C7, less at C5-C6. Multilevel facet degenerative changes bilaterally. Vertebral body heights maintained without fracture or subluxation. Visualized skullbase intact. Incomplete posterior arch C1, developmental variant. Emphysematous changes at lung apices. Few scattered atherosclerotic calcifications. IMPRESSION: Minimal small vessel chronic ischemic changes of deep cerebral white matter. No acute intracranial abnormalities. No acute facial bone abnormalities. 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 No acute cervical spine abnormalities. Electronically Signed   By: Lavonia Dana M.D.   On: 06/07/2015 14:15   Ct Cervical Spine Wo Contrast  06/07/2015  CLINICAL DATA:  [place] out of bed last night striking side of head, headache, slowly answer questions, LEFT facial swelling, smoker EXAM: CT HEAD WITHOUT CONTRAST CT MAXILLOFACIAL WITHOUT CONTRAST CT CERVICAL SPINE WITHOUT CONTRAST TECHNIQUE: Multidetector CT imaging of the head, cervical spine, and maxillofacial structures were performed using the standard protocol without intravenous  contrast. Multiplanar CT image reconstructions of the cervical spine and maxillofacial structures were also generated. Right side of face marked with BB. COMPARISON:  None FINDINGS: CT HEAD FINDINGS Minimal scattered motion artifacts. Normal ventricular morphology. No midline shift or mass effect. No definite intracranial hemorrhage, mass lesion or evidence acute infarction. Minimal periventricular white matter hypoattenuation potentially small vessel chronic ischemic changes. No extra-axial fluid collections. Calvaria intact. CT MAXILLOFACIAL FINDINGS Visualized intracranial structures unremarkable. Intra orbital fat planes clear. Nasal septal deviation to the LEFT. Paranasal sinuses, mastoid air cells and and middle ear cavities clear. Periorbital soft tissue swelling RIGHT greater than LEFT. No definite facial bone fractures identified. CT CERVICAL SPINE FINDINGS Mild diffuse osseous demineralization. Prevertebral soft tissues normal thickness. Disc space narrowing and endplate spur formation C6-C7, less at C5-C6. Multilevel facet degenerative changes bilaterally. Vertebral body heights maintained without fracture or subluxation. Visualized skullbase intact. Incomplete posterior arch C1, developmental variant. Emphysematous changes at lung apices. Few scattered atherosclerotic calcifications. IMPRESSION: Minimal small vessel chronic ischemic changes of deep cerebral white matter. No acute intracranial abnormalities. No acute facial bone  abnormalities. 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 No acute cervical spine abnormalities. Electronically Signed   By: Lavonia Dana M.D.   On: 06/07/2015 14:15   Mr Brain Wo Contrast  06/08/2015  CLINICAL DATA:  Recent fall out of bed at home with facial/head injury. Acute encephalopathy. Paresthesia. Initial encounter. EXAM: MRI HEAD WITHOUT CONTRAST TECHNIQUE: Multiplanar, multiecho pulse sequences of the brain and surrounding structures were obtained  without intravenous contrast. COMPARISON:  Head CT 06/07/2015 and MRI 05/12/2013 FINDINGS: No acute infarct is identified. A subcentimeter focus of increased trace diffusion signal in the white matter along the posterior margin of the body of the left lateral ventricle is unchanged from the prior MRI and without reduced ADC. No intracranial hemorrhage, mass, midline shift, or extra-axial fluid collection is identified. Lateral and third ventricular enlargement is unchanged from the prior MRI and may reflect central predominant cerebral atrophy of a moderate degree. Patchy T2 hyperintensities in the cerebral white matter are unchanged and nonspecific but compatible with moderate chronic small vessel ischemic disease. A small, chronic infarct is again seen in the superior right cerebellum. There is also a punctate, chronic white matter infarct just posterior to the left insula. Orbits are unremarkable. Paranasal sinuses and mastoid air cells are clear. Major intracranial vascular flow voids are preserved. Right periorbital soft tissue swelling is again noted. IMPRESSION: 1. No acute intracranial abnormality. 2. Moderate chronic small vessel ischemic disease and cerebral atrophy. Chronic right cerebellar infarct. Electronically Signed   By: Logan Bores M.D.   On: 06/08/2015 18:04   Ct Maxillofacial Wo Cm  06/07/2015  CLINICAL DATA:  [place] out of bed last night striking side of head, headache, slowly answer questions, LEFT facial swelling, smoker EXAM: CT HEAD WITHOUT CONTRAST CT MAXILLOFACIAL WITHOUT CONTRAST CT CERVICAL SPINE WITHOUT CONTRAST TECHNIQUE: Multidetector CT imaging of the head, cervical spine, and maxillofacial structures were performed using the standard protocol without intravenous contrast. Multiplanar CT image reconstructions of the cervical spine and maxillofacial structures were also generated. Right side of face marked with BB. COMPARISON:  None FINDINGS: CT HEAD FINDINGS Minimal scattered  motion artifacts. Normal ventricular morphology. No midline shift or mass effect. No definite intracranial hemorrhage, mass lesion or evidence acute infarction. Minimal periventricular white matter hypoattenuation potentially small vessel chronic ischemic changes. No extra-axial fluid collections. Calvaria intact. CT MAXILLOFACIAL FINDINGS Visualized intracranial structures unremarkable. Intra orbital fat planes clear. Nasal septal deviation to the LEFT. Paranasal sinuses, mastoid air cells and and middle ear cavities clear. Periorbital soft tissue swelling RIGHT greater than LEFT. No definite facial bone fractures identified. CT CERVICAL SPINE FINDINGS Mild diffuse osseous demineralization. Prevertebral soft tissues normal thickness. Disc space narrowing and endplate spur formation C6-C7, less at C5-C6. Multilevel facet degenerative changes bilaterally. Vertebral body heights maintained without fracture or subluxation. Visualized skullbase intact. Incomplete posterior arch C1, developmental variant. Emphysematous changes at lung apices. Few scattered atherosclerotic calcifications. IMPRESSION: Minimal small vessel chronic ischemic changes of deep cerebral white matter. No acute intracranial abnormalities. No acute facial bone abnormalities. 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 No acute cervical spine abnormalities. Electronically Signed   By: Lavonia Dana M.D.   On: 06/07/2015 14:15    Microbiology: Recent Results (from the past 240 hour(s))  Urine culture     Status: Abnormal   Collection Time: 06/07/15  2:50 PM  Result Value Ref Range Status   Specimen Description URINE, CATHETERIZED  Final   Special Requests NONE  Final   Culture >=100,000  COLONIES/mL ESCHERICHIA COLI (A)  Final   Report Status 06/10/2015 FINAL  Final   Organism ID, Bacteria ESCHERICHIA COLI (A)  Final      Susceptibility   Escherichia coli - MIC*    AMPICILLIN <=2 SENSITIVE Sensitive     CEFAZOLIN <=4  SENSITIVE Sensitive     CEFTRIAXONE <=1 SENSITIVE Sensitive     CIPROFLOXACIN <=0.25 SENSITIVE Sensitive     GENTAMICIN <=1 SENSITIVE Sensitive     IMIPENEM <=0.25 SENSITIVE Sensitive     NITROFURANTOIN <=16 SENSITIVE Sensitive     TRIMETH/SULFA <=20 SENSITIVE Sensitive     AMPICILLIN/SULBACTAM <=2 SENSITIVE Sensitive     PIP/TAZO <=4 SENSITIVE Sensitive     * >=100,000 COLONIES/mL ESCHERICHIA COLI     Labs: CBC:  Recent Labs Lab 06/07/15 1240 06/08/15 0545 06/09/15 0415  WBC 7.7 9.0 10.2  NEUTROABS 4.6  --   --   HGB 14.4 14.3 14.4  HCT 41.0 41.0 41.6  MCV 91.1 90.7 91.0  PLT 97* 99* 89*   Basic Metabolic Panel:  Recent Labs Lab 06/07/15 1240 06/08/15 0545 06/09/15 0415 06/10/15 0610 06/11/15 0555  NA 130* 132* 133* 132* 135  K 3.8 3.3* 4.3 3.8 3.6  CL 92* 96* 102 98* 99*  CO2 32 28 26 28 31   GLUCOSE 76 90 76 86 79  BUN 15 7 7 8 7   CREATININE 0.51 0.40* 0.39* 0.44 0.42*  CALCIUM 9.5 9.0 9.1 9.3 9.1  MG  --   --  1.3*  --   --    Liver Function Tests:  Recent Labs Lab 06/07/15 1240 06/08/15 0545 06/09/15 0415  AST 67* 72* 49*  ALT 24 30 23   ALKPHOS 63 60 53  BILITOT 0.3 0.5 0.7  PROT 6.5 6.1* 5.7*  ALBUMIN 3.7 3.3* 2.9*   No results for input(s): LIPASE, AMYLASE in the last 168 hours.  Recent Labs Lab 06/11/15 0929  AMMONIA 15   Cardiac Enzymes:  Recent Labs Lab 06/07/15 1240 06/08/15 0545 06/09/15 0415 06/10/15 0610 06/11/15 0555  CKTOTAL 2798* 1844* 639* 280* 138  TROPONINI <0.03  --   --   --   --    BNP (last 3 results) No results for input(s): BNP in the last 8760 hours. CBG: No results for input(s): GLUCAP in the last 168 hours. Time spent: 30 minutes  Signed:  Berle Mull  [facility] 06/11/2015 , 7:43 AM

## 2015-07-10 ENCOUNTER — Emergency Department (HOSPITAL_COMMUNITY)
Admission: EM | Admit: 2015-07-10 | Discharge: 2015-07-10 | Disposition: A | Discharge status: Home / Self Care | Payer: Medicaid Other | Attending: Emergency Medicine | Admitting: Emergency Medicine

## 2015-07-10 ENCOUNTER — Emergency Department (HOSPITAL_COMMUNITY): Payer: Medicaid Other

## 2015-07-10 ENCOUNTER — Encounter (HOSPITAL_COMMUNITY): Payer: Self-pay | Admitting: Emergency Medicine

## 2015-07-10 DIAGNOSIS — Z791 Long term (current) use of non-steroidal anti-inflammatories (NSAID): Secondary | ICD-10-CM | POA: Diagnosis not present

## 2015-07-10 DIAGNOSIS — Z79899 Other long term (current) drug therapy: Secondary | ICD-10-CM | POA: Insufficient documentation

## 2015-07-10 DIAGNOSIS — S61511A Laceration without foreign body of right wrist, initial encounter: Secondary | ICD-10-CM | POA: Insufficient documentation

## 2015-07-10 DIAGNOSIS — W06XXXA Fall from bed, initial encounter: Secondary | ICD-10-CM | POA: Insufficient documentation

## 2015-07-10 DIAGNOSIS — S51011A Laceration without foreign body of right elbow, initial encounter: Secondary | ICD-10-CM | POA: Diagnosis not present

## 2015-07-10 DIAGNOSIS — Y929 Unspecified place or not applicable: Secondary | ICD-10-CM | POA: Insufficient documentation

## 2015-07-10 DIAGNOSIS — Z7982 Long term (current) use of aspirin: Secondary | ICD-10-CM | POA: Diagnosis not present

## 2015-07-10 DIAGNOSIS — S8002XA Contusion of left knee, initial encounter: Secondary | ICD-10-CM | POA: Diagnosis not present

## 2015-07-10 DIAGNOSIS — F319 Bipolar disorder, unspecified: Secondary | ICD-10-CM | POA: Diagnosis not present

## 2015-07-10 DIAGNOSIS — I639 Cerebral infarction, unspecified: Secondary | ICD-10-CM | POA: Diagnosis not present

## 2015-07-10 DIAGNOSIS — N189 Chronic kidney disease, unspecified: Secondary | ICD-10-CM | POA: Insufficient documentation

## 2015-07-10 DIAGNOSIS — Y939 Activity, unspecified: Secondary | ICD-10-CM | POA: Diagnosis not present

## 2015-07-10 DIAGNOSIS — Y999 Unspecified external cause status: Secondary | ICD-10-CM | POA: Diagnosis not present

## 2015-07-10 DIAGNOSIS — M549 Dorsalgia, unspecified: Secondary | ICD-10-CM | POA: Diagnosis present

## 2015-07-10 DIAGNOSIS — S8001XA Contusion of right knee, initial encounter: Secondary | ICD-10-CM | POA: Insufficient documentation

## 2015-07-10 DIAGNOSIS — F17210 Nicotine dependence, cigarettes, uncomplicated: Secondary | ICD-10-CM | POA: Diagnosis not present

## 2015-07-10 DIAGNOSIS — W19XXXA Unspecified fall, initial encounter: Secondary | ICD-10-CM

## 2015-07-10 LAB — BASIC METABOLIC PANEL
Anion gap: 5 (ref 5–15)
BUN: 16 mg/dL (ref 6–20)
CHLORIDE: 94 mmol/L — AB (ref 101–111)
CO2: 33 mmol/L — AB (ref 22–32)
Calcium: 9.3 mg/dL (ref 8.9–10.3)
Creatinine, Ser: 0.51 mg/dL (ref 0.44–1.00)
GFR calc non Af Amer: >60 mL/min (ref >60)
Glucose, Bld: 85 mg/dL (ref 65–99)
POTASSIUM: 3.7 mmol/L (ref 3.5–5.1)
SODIUM: 132 mmol/L — AB (ref 135–145)

## 2015-07-10 LAB — CBC WITH DIFFERENTIAL/PLATELET
Basophils Absolute: 0 10*3/uL (ref 0.0–0.1)
Basophils Relative: 0 %
EOS ABS: 0.1 10*3/uL (ref 0.0–0.7)
Eosinophils Relative: 1 %
HEMATOCRIT: 35.8 % — AB (ref 36.0–46.0)
HEMOGLOBIN: 12.1 g/dL (ref 12.0–15.0)
LYMPHS ABS: 2.4 10*3/uL (ref 0.7–4.0)
Lymphocytes Relative: 31 %
MCH: 31.8 pg (ref 26.0–34.0)
MCHC: 33.8 g/dL (ref 30.0–36.0)
MCV: 94.2 fL (ref 78.0–100.0)
MONOS PCT: 8 %
Monocytes Absolute: 0.6 10*3/uL (ref 0.1–1.0)
NEUTROS ABS: 4.7 10*3/uL (ref 1.7–7.7)
NEUTROS PCT: 60 %
Platelets: 128 10*3/uL — ABNORMAL LOW (ref 150–400)
RBC: 3.8 MIL/uL — AB (ref 3.87–5.11)
RDW: 14.7 % (ref 11.5–15.5)
WBC: 7.8 10*3/uL (ref 4.0–10.5)

## 2015-07-10 LAB — URINALYSIS, ROUTINE W REFLEX MICROSCOPIC
Bilirubin Urine: NEGATIVE
Glucose, UA: NEGATIVE mg/dL
Hgb urine dipstick: NEGATIVE
Ketones, ur: NEGATIVE mg/dL
Leukocytes, UA: NEGATIVE
NITRITE: NEGATIVE
PH: 6.5 (ref 5.0–8.0)
Protein, ur: NEGATIVE mg/dL
SPECIFIC GRAVITY, URINE: 1.02 (ref 1.005–1.030)

## 2015-07-10 MED ORDER — SODIUM CHLORIDE 0.9 % IV BOLUS (SEPSIS)
1000.0000 mL | Freq: Once | INTRAVENOUS | Status: AC
  Administered 2015-07-10: 1000 mL via INTRAVENOUS

## 2015-07-10 NOTE — ED Notes (Signed)
Ambulated pt to the the bathroom; pt walked with little assistance,small steps and unsteady gait.

## 2015-07-10 NOTE — ED Notes (Signed)
Case Manager at the bedside

## 2015-07-10 NOTE — ED Notes (Addendum)
Patient given discharge instruction, verbalized understand. Patient wheelchair out of the department.  

## 2015-07-10 NOTE — ED Notes (Signed)
Patient ambulatory to restroom with techs assistance

## 2015-07-10 NOTE — ED Notes (Signed)
IV infusing without difficulty, Sitter back in the room, pharmacy reviewing meds

## 2015-07-10 NOTE — ED Provider Notes (Signed)
CSN: [id number]     Arrival date & time 07/10/15  [id number] History  By signing my name below, I, [name], attest that this documentation has been prepared under the direction and in the presence of Merrily Pew, MD. Electronically Signed: [name], ED Scribe. 07/10/2015. 11:40 AM.   Chief Complaint  Patient presents with  . Fall  . Back Pain   The history is provided by the patient. No language interpreter was used.  HPI Comments (Level 5 Caveat: pt is a poor historian) : Cassidy Smith is a 60 y.o. Female with a hx of neuromuscular disorder, CKD, and stroke who presents to the Emergency Department complaining of a fall. Pt states that she falls all the time because she lost her balance. She reports that she has fallen out of her bed 10 times. She states that it has "been a while" since her last fall and last hit her head in May. She does not know why she lost her balance. She states that she takes a lot of medications for bipolar disorder. Pt lives alone but has a full time caretaker. She is ambulatory with a walker. Pt states that there has been talk of her moving to assisted living facilities, but she does not want to go.   Past Medical History  Diagnosis Date  . Bipolar 1 disorder ([place])   . Anxiety   . Depression   . Headache(784.0)   . Neuromuscular disorder ([facility])     shaking of hands   . Chronic kidney disease     uti  currently,  hx bladder spasms  . Stroke ([place])   . Chronic hip pain    Past Surgical History  Procedure Laterality Date  . Multiple extractions with alveoloplasty N/A 10/30/2012    Procedure: MULTIPLE EXTRACION 5, 6, 8, 9, 10 ,18, 19, 31 WITH MAXILLARY RIGHT AND LEFT  ALVEOLOPLASTY REDUCE MAXILLARY LEFT TUBEROSITY;  Surgeon: Gae Bon, DDS;  Location: [facility];  Service: Oral Surgery;  Laterality: N/A;   Family History  Problem Relation Age of Onset  . Breast cancer Mother   . Alcoholism Father    Social History  Substance Use Topics  . Smoking  status: Current Every Day Smoker -- 1.00 packs/day for 20 years    Types: Cigarettes  . Smokeless tobacco: None  . Alcohol Use: No   OB History    No data available     Review of Systems  Unable to perform ROS: Other  Pt is a poor historian  Allergies  Review of patient's allergies indicates no known allergies.  Home Medications   Prior to Admission medications   Medication Sig Start Date End Date Taking? Authorizing Provider  aspirin EC 81 MG EC tablet Take 1 tablet (81 mg total) by mouth daily. 05/15/13  Yes Samuella Cota, MD  benztropine (COGENTIN) 1 MG tablet Take 1 mg by mouth 2 (two) times daily.   Yes Historical Provider, MD  clonazePAM (KLONOPIN) 0.5 MG tablet Take 0.5 mg by mouth at bedtime.   Yes Historical Provider, MD  divalproex (DEPAKOTE ER) 500 MG 24 hr tablet Take 1,500 mg by mouth at bedtime.    Yes Historical Provider, MD  gabapentin (NEURONTIN) 300 MG capsule Take 300 mg by mouth 3 (three) times daily. 07/04/15  Yes Historical Provider, MD  midodrine (PROAMATINE) 10 MG tablet Take 1 tablet (10 mg total) by mouth 3 (three) times daily with meals. 06/11/15  Yes Lavina Hamman, MD  Multiple  Vitamins-Minerals (CENTRUM SILVER ADULT 50+ PO) Take 1 tablet by mouth every morning.   Yes Historical Provider, MD  naproxen (NAPROSYN) 500 MG tablet Take 500 mg by mouth 2 (two) times daily as needed for mild pain or moderate pain.   Yes Historical Provider, MD  nicotine (NICODERM CQ - DOSED IN MG/24 HOURS) 21 mg/24hr patch Place 1 patch (21 mg total) onto the skin daily. 06/11/15  Yes Lavina Hamman, MD  polyethylene glycol (MIRALAX / GLYCOLAX) packet Take 17 g by mouth daily. 06/11/15  Yes Lavina Hamman, MD  RISPERDAL CONSTA 50 MG injection Inject 50 mg into the muscle every 14 (fourteen) days. 10/24/14  Yes Historical Provider, MD  risperidone (RISPERDAL) 4 MG tablet Take 4 mg by mouth at bedtime. 06/22/15  Yes Historical Provider, MD  traMADol (ULTRAM) 50 MG tablet Take 1-2  tablets by mouth every 6 (six) hours as needed. 07/04/15  Yes Historical Provider, MD  traZODone (DESYREL) 100 MG tablet Take 100 mg by mouth at bedtime.    Yes Historical Provider, MD  vitamin B-12 (CYANOCOBALAMIN) 1000 MCG tablet Take 1,000 mcg by mouth daily.   Yes Historical Provider, MD   BP 161/119 mmHg  Pulse 60  Temp(Src) 97.5 F (36.4 C) (Oral)  Resp 18  Ht 5\' 5"  (1.651 m)  Wt 121 lb (54.885 kg)  BMI 20.14 kg/m2  SpO2 100% Physical Exam  Constitutional: She is oriented to person, place, and time. She appears well-developed and well-nourished.  HENT:  Head: Normocephalic.  Eyes: EOM are normal. Pupils are equal, round, and reactive to light.  Neck: Normal range of motion. Neck supple.  Cardiovascular: Normal rate, regular rhythm and normal heart sounds.  Exam reveals no gallop and no friction rub.   No murmur heard. Pulmonary/Chest: Effort normal and breath sounds normal. She has no wheezes. She exhibits no tenderness.  Abdominal: Soft. There is no tenderness.  Musculoskeletal: Normal range of motion. She exhibits no tenderness.  No C, T, or L spine tenderness; no tenderness to extremities  Neurological: She is alert and oriented to person, place, and time.  Skin: Skin is warm and dry. Abrasion and bruising noted.  Small skin tear to right dorsal wrist and right lateral elbow, bandage to left dorsal arm; multiple bruises around bilateral knees  Psychiatric: She has a normal mood and affect. Her behavior is normal.  Nursing note and vitals reviewed.   ED Course  Procedures (including critical care time) DIAGNOSTIC STUDIES: Oxygen Saturation is 100% on RA, normal by my interpretation.    COORDINATION OF CARE: 11:39 AM- CT scans  Labs Review Labs Reviewed  CBC WITH DIFFERENTIAL/PLATELET - Abnormal; Notable for the following:    RBC 3.80 (*)    HCT 35.8 (*)    Platelets 128 (*)    All other components within normal limits  BASIC METABOLIC PANEL - Abnormal; Notable  for the following:    Sodium 132 (*)    Chloride 94 (*)    CO2 33 (*)    All other components within normal limits  URINALYSIS, ROUTINE W REFLEX MICROSCOPIC (NOT AT [facility])    Imaging Review Ct Head Wo Contrast  07/10/2015  CLINICAL DATA:  Urinary tract infection last month.  Unsteady gait. EXAM: CT HEAD WITHOUT CONTRAST TECHNIQUE: Contiguous axial images were obtained from the base of the skull through the vertex without intravenous contrast. COMPARISON:  06/07/2015 FINDINGS: Stable lacunar infarct, right cerebellum, image 11/2, 1.5 by 0.4 cm. Subtle hypodensity in the left  upper pons on image 13/2, favor artifact. Otherwise, the brainstem, cerebellum, cerebral peduncles, thalami, basal ganglia, basilar cisterns, and ventricular system appear within normal limits. Periventricular white matter and corona radiata hypodensities favor chronic ischemic microvascular white matter disease. No intracranial hemorrhage, mass lesion, or acute CVA. IMPRESSION: 1. No acute intracranial findings. 2. Periventricular white matter and corona radiata hypodensities favor chronic ischemic microvascular white matter disease. 3. 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 Electronically Signed   By: Van Clines M.D.   On: 07/10/2015 12:13   I have personally reviewed and evaluated these images and lab results as part of my medical decision-making.   EKG Interpretation None      MDM   Final diagnoses:  Fall, initial encounter    60 year old female here with likely polypharmacy causing weakness and falls at night. Also has a history of orthostatic hypotension which could be contributing as well. CT head without any evidence of intracranial injuries. Labs and imaging unremarkable. Able to ambulate with assistance which is her baseline. Kennon Holter is here feel that she needs 24 care however that is not something that I can provide out of the emergency department. So I have contacted the care management who  will ask and RN to help with polypharmacy and to discuss with her primary doctor medication changes and adjustments and patient and caregiver will also speak with PCP about trying to arrange 24-hour care.  New Prescriptions: New Prescriptions   No medications on file     I have personally and contemperaneously reviewed labs and imaging and used in my decision making as above.   A medical screening exam was performed and I feel the patient has had an appropriate workup for their chief complaint at this time and likelihood of emergent condition existing is low and thus workup can continue on an outpatient basis.. Their vital signs are stable. They have been counseled on decision, discharge, follow up and which symptoms necessitate immediate return to the emergency department.  They verbally stated understanding and agreement with plan and discharged in stable condition.   I personally performed the services described in this documentation, which was scribed in my presence. The recorded information has been reviewed and is accurate.  Merrily Pew, MD 07/10/15 [phone]

## 2015-07-10 NOTE — Care Management (Signed)
CM consult received. Pt was recently DC with [facility] RN through [facility]. Pt was DC'd from [facility] services 2 weeks ago but would like to resume services through them again. Pt made aware HH has 48 hrs to initiate services. Romualdo Bolk, of [facility], made aware of referral and will obtain pt info from chart. Pt states her aid will pick her up from ED and transport her home. Pt understands Medicaid will not pay for physical therapy.

## 2015-07-10 NOTE — ED Notes (Addendum)
Patient given discharge instruction, verbalized understand. IV removed, band aid applied. Patient wheelchair out of the department.  

## 2015-07-10 NOTE — ED Notes (Signed)
Patient ambulatory to restroom with tech assistance, clean catch instructions given and advised pt to bring specimen back to room as well.

## 2015-07-10 NOTE — ED Notes (Signed)
Multiple bruised to face, arms and legs, skin tears to right arm

## 2015-07-10 NOTE — ED Notes (Signed)
Patient states "I am here with Baldo Ash who is my nurses aide and she's getting blood work done. She thought it best that I come here because I have fallen last night and the night before." Patient is poor historian. Complaining of back, neck, and nose pain.

## 2015-07-10 NOTE — ED Notes (Signed)
Sitter at the bedside, states she is off the weekend, and per family fell a couple of times this weekend. They did not bring her for treatment. Sitter states she was confused this morning when she arrived, states pt had UTI last month and and was acting the same way. She feels like pt needs 24/7 care. Pt now saying she fell 4 times.

## 2015-07-18 ENCOUNTER — Encounter (HOSPITAL_COMMUNITY): Payer: Self-pay | Admitting: Vascular Surgery

## 2015-07-18 ENCOUNTER — Emergency Department (HOSPITAL_COMMUNITY)
Admission: EM | Admit: 2015-07-18 | Discharge: 2015-07-18 | Disposition: A | Discharge status: Home / Self Care | Payer: Medicaid Other | Attending: Emergency Medicine | Admitting: Emergency Medicine

## 2015-07-18 ENCOUNTER — Emergency Department (HOSPITAL_COMMUNITY): Payer: Medicaid Other

## 2015-07-18 DIAGNOSIS — N189 Chronic kidney disease, unspecified: Secondary | ICD-10-CM | POA: Diagnosis not present

## 2015-07-18 DIAGNOSIS — Z7982 Long term (current) use of aspirin: Secondary | ICD-10-CM | POA: Diagnosis not present

## 2015-07-18 DIAGNOSIS — F17210 Nicotine dependence, cigarettes, uncomplicated: Secondary | ICD-10-CM | POA: Diagnosis not present

## 2015-07-18 DIAGNOSIS — D696 Thrombocytopenia, unspecified: Secondary | ICD-10-CM | POA: Insufficient documentation

## 2015-07-18 DIAGNOSIS — W19XXXA Unspecified fall, initial encounter: Secondary | ICD-10-CM

## 2015-07-18 DIAGNOSIS — R4182 Altered mental status, unspecified: Secondary | ICD-10-CM | POA: Diagnosis present

## 2015-07-18 DIAGNOSIS — F319 Bipolar disorder, unspecified: Secondary | ICD-10-CM | POA: Diagnosis not present

## 2015-07-18 DIAGNOSIS — Z8673 Personal history of transient ischemic attack (TIA), and cerebral infarction without residual deficits: Secondary | ICD-10-CM | POA: Insufficient documentation

## 2015-07-18 DIAGNOSIS — R9082 White matter disease, unspecified: Secondary | ICD-10-CM | POA: Insufficient documentation

## 2015-07-18 DIAGNOSIS — E871 Hypo-osmolality and hyponatremia: Secondary | ICD-10-CM

## 2015-07-18 LAB — CBC
HEMATOCRIT: 35.2 % — AB (ref 36.0–46.0)
Hemoglobin: 11.9 g/dL — ABNORMAL LOW (ref 12.0–15.0)
MCH: 31.3 pg (ref 26.0–34.0)
MCHC: 33.8 g/dL (ref 30.0–36.0)
MCV: 92.6 fL (ref 78.0–100.0)
Platelets: 104 10*3/uL — ABNORMAL LOW (ref 150–400)
RBC: 3.8 MIL/uL — AB (ref 3.87–5.11)
RDW: 14.7 % (ref 11.5–15.5)
WBC: 6.2 10*3/uL (ref 4.0–10.5)

## 2015-07-18 LAB — I-STAT TROPONIN, ED: TROPONIN I, POC: 0 ng/mL (ref 0.00–0.08)

## 2015-07-18 LAB — URINALYSIS, ROUTINE W REFLEX MICROSCOPIC
BILIRUBIN URINE: NEGATIVE
GLUCOSE, UA: NEGATIVE mg/dL
Hgb urine dipstick: NEGATIVE
KETONES UR: NEGATIVE mg/dL
Leukocytes, UA: NEGATIVE
Nitrite: NEGATIVE
PH: 7 (ref 5.0–8.0)
Protein, ur: NEGATIVE mg/dL
Specific Gravity, Urine: 1.012 (ref 1.005–1.030)

## 2015-07-18 LAB — BASIC METABOLIC PANEL
ANION GAP: 6 (ref 5–15)
BUN: 16 mg/dL (ref 6–20)
CHLORIDE: 91 mmol/L — AB (ref 101–111)
CO2: 30 mmol/L (ref 22–32)
Calcium: 9.8 mg/dL (ref 8.9–10.3)
Creatinine, Ser: 0.57 mg/dL (ref 0.44–1.00)
Glucose, Bld: 84 mg/dL (ref 65–99)
POTASSIUM: 4.2 mmol/L (ref 3.5–5.1)
SODIUM: 127 mmol/L — AB (ref 135–145)

## 2015-07-18 LAB — AMMONIA: Ammonia: 29 umol/L (ref 9–35)

## 2015-07-18 MED ORDER — SODIUM CHLORIDE 0.9 % IV BOLUS (SEPSIS)
1000.0000 mL | Freq: Once | INTRAVENOUS | Status: AC
  Administered 2015-07-18: 1000 mL via INTRAVENOUS

## 2015-07-18 NOTE — ED Notes (Signed)
[place] sandwich and soda given to pt per Sharyn Lull, Therapist, sports

## 2015-07-18 NOTE — ED Provider Notes (Signed)
CSN: [id number]     Arrival date & time 07/18/15  1147 History   First MD Initiated Contact with Patient 07/18/15 1155     Chief Complaint  Patient presents with  . Fall   (Consider location/radiation/quality/duration/timing/severity/associated sxs/prior Treatment) HPI 60 y.o. female with a hx of Bipolar 1 Disorder, Neuromuscular Disorder, CKD, Stroke, AMS, Confusion, presents to the Emergency Department today s/p fall yesterday. Pt states that she was standing outside ironing when she felt her vision go black and went unconscious. States that she hit the back of her head. Pt does not endorse any CP/SOB/ABD pain that time. No headache. Pt did not go from a seated to a standing position. Hx of similar episodes in the past. Has hx of falls. States that she took some medication this morning and unsure of which one. Notes low back pain that she rates as a 10/10. No loss of bowel or bladder function. No saddle anesthesia. Denies blood thinner use. No numbness/tingling. No fevers. Pt does have recent visit to ED on 07-10-15 for fall likely due to polypharmacy. Has Hx of Fall. Pt has seen Neurologist on 06-08-15 and diagnosed with mild global encephalopathy 2/2 renal failure vs hepatic dysfunction. On chart review, pt was confused and lethargic at that time as well. Appears baseline for her. Above HPI questionable reliability with no family present currently and unable to contact. No other symptoms noted.     Level V Caveat: Patient is Poor Historian. History is unreliable as patient has apparent AMS and confusion as noted x 2 months ago.  Past Medical History  Diagnosis Date  . Bipolar 1 disorder ([place])   . Anxiety   . Depression   . Headache(784.0)   . Neuromuscular disorder (H[facility])     shaking of hands   . Chronic kidney disease     uti  currently,  hx bladder spasms  . Stroke ([place])   . Chronic hip pain    Past Surgical History  Procedure Laterality Date  . Multiple extractions with alveoloplasty  N/A 10/30/2012    Procedure: MULTIPLE EXTRACION 5, 6, 8, 9, 10 ,18, 19, 31 WITH MAXILLARY RIGHT AND LEFT  ALVEOLOPLASTY REDUCE MAXILLARY LEFT TUBEROSITY;  Surgeon: Gae Bon, DDS;  Location: [place];  Service: Oral Surgery;  Laterality: N/A;   Family History  Problem Relation Age of Onset  . Breast cancer Mother   . Alcoholism Father    Social History  Substance Use Topics  . Smoking status: Current Every Day Smoker -- 1.00 packs/day for 20 years    Types: Cigarettes  . Smokeless tobacco: None  . Alcohol Use: No   OB History    No data available     Review of Systems  Unable to perform ROS: Other   Allergies  Review of patient's allergies indicates no known allergies.  Home Medications   Prior to Admission medications   Medication Sig Start Date End Date Taking? Authorizing Provider  aspirin EC 81 MG EC tablet Take 1 tablet (81 mg total) by mouth daily. 05/15/13   Samuella Cota, MD  benztropine (COGENTIN) 1 MG tablet Take 1 mg by mouth 2 (two) times daily.    Historical Provider, MD  clonazePAM (KLONOPIN) 0.5 MG tablet Take 0.5 mg by mouth at bedtime.    Historical Provider, MD  divalproex (DEPAKOTE ER) 500 MG 24 hr tablet Take 1,500 mg by mouth at bedtime.     Historical Provider, MD  gabapentin (NEURONTIN) 300 MG capsule Take  300 mg by mouth 3 (three) times daily. 07/04/15   Historical Provider, MD  midodrine (PROAMATINE) 10 MG tablet Take 1 tablet (10 mg total) by mouth 3 (three) times daily with meals. 06/11/15   Lavina Hamman, MD  Multiple Vitamins-Minerals (CENTRUM SILVER ADULT 50+ PO) Take 1 tablet by mouth every morning.    Historical Provider, MD  naproxen (NAPROSYN) 500 MG tablet Take 500 mg by mouth 2 (two) times daily as needed for mild pain or moderate pain.    Historical Provider, MD  nicotine (NICODERM CQ - DOSED IN MG/24 HOURS) 21 mg/24hr patch Place 1 patch (21 mg total) onto the skin daily. 06/11/15   Lavina Hamman, MD  polyethylene glycol (MIRALAX /  Floria Raveling) packet Take 17 g by mouth daily. 06/11/15   Lavina Hamman, MD  RISPERDAL CONSTA 50 MG injection Inject 50 mg into the muscle every 14 (fourteen) days. 10/24/14   Historical Provider, MD  risperidone (RISPERDAL) 4 MG tablet Take 4 mg by mouth at bedtime. 06/22/15   Historical Provider, MD  traMADol (ULTRAM) 50 MG tablet Take 1-2 tablets by mouth every 6 (six) hours as needed. 07/04/15   Historical Provider, MD  traZODone (DESYREL) 100 MG tablet Take 100 mg by mouth at bedtime.     Historical Provider, MD  vitamin B-12 (CYANOCOBALAMIN) 1000 MCG tablet Take 1,000 mcg by mouth daily.    Historical Provider, MD   BP 104/86 mmHg  Pulse 75  Temp(Src) 97.4 F (36.3 C)  Resp 12  SpO2 98%   Physical Exam  Constitutional: She appears well-developed and well-nourished.  Pt appears slowed in her speech   HENT:  Head: Normocephalic and atraumatic.  Eyes: EOM are normal. Pupils are equal, round, and reactive to light.  Neck: Normal range of motion. Neck supple. No tracheal deviation present.  Cardiovascular: Normal rate, regular rhythm, normal heart sounds and intact distal pulses.   No murmur heard. Pulmonary/Chest: Effort normal and breath sounds normal. No respiratory distress. She has no wheezes. She has no rales. She exhibits no tenderness.  Abdominal: Soft. There is no tenderness.  Musculoskeletal: Normal range of motion.       Cervical back: Normal.       Thoracic back: Normal.       Lumbar back: She exhibits tenderness.  Neurological: She is alert. She has normal strength. No cranial nerve deficit or sensory deficit.  Pt is alert and oriented to person, place, but not time.  Cranial Nerves:  II: Pupils equal, round, reactive to light III,IV, VI: ptosis not present, extra-ocular motions intact bilaterally  V,VII: smile symmetric, facial light touch sensation equal VIII: hearing grossly normal bilaterally  IX,X: midline uvula rise  XI: bilateral shoulder shrug equal and  strong XII: midline tongue extension  Skin: Skin is warm and dry.  Psychiatric: She has a normal mood and affect. Her behavior is normal. Thought content normal.  Nursing note and vitals reviewed.   ED Course  Procedures (including critical care time) Labs Review Labs Reviewed  BASIC METABOLIC PANEL - Abnormal; Notable for the following:    Sodium 127 (*)    Chloride 91 (*)    All other components within normal limits  CBC - Abnormal; Notable for the following:    RBC 3.80 (*)    Hemoglobin 11.9 (*)    HCT 35.2 (*)    Platelets 104 (*)    All other components within normal limits  URINE CULTURE  AMMONIA  URINALYSIS, ROUTINE  W REFLEX MICROSCOPIC (NOT AT [facility])  I-STAT TROPOININ, ED   Imaging Review Dg Lumbar Spine Complete  07/18/2015  CLINICAL DATA:  Fall onto back X1 day ago; c/o posterior lower back pain radiating across to bilateral posterior hips EXAM: LUMBAR SPINE - COMPLETE 4+ VIEW COMPARISON:  None. FINDINGS: No fracture. Minimal anterolisthesis of L4 on L5. No other spondylolisthesis. Mild loss disc height at L1-L2, L2-L3 and L3-L4. Moderate loss disc height at L4-L5 and L5-S1. Bones are demineralized. There are calcifications along a normal caliber abdominal aorta. IMPRESSION: No fracture or acute finding.  Degenerative changes as described. Electronically Signed   By: Lajean Manes M.D.   On: 07/18/2015 13:13   Ct Head Wo Contrast  07/18/2015  CLINICAL DATA:  Status post fall EXAM: CT HEAD WITHOUT CONTRAST TECHNIQUE: Contiguous axial images were obtained from the base of the skull through the vertex without intravenous contrast. COMPARISON:  07/10/2015 FINDINGS: Brain: No evidence of acute infarction, hemorrhage, extra-axial collection, ventriculomegaly, or mass effect.There is low attenuation throughout the subcortical and periventricular white matter compatible with chronic microvascular disease. Prominence of the sulci and ventricles noted consistent with brain atrophy.  Chronic infarct within the right cerebellar hemisphere is unchanged from previous exam. Vascular: No hyperdense vessel or unexpected calcification. Skull: Negative for fracture or focal lesion. Sinuses/Orbits: No acute findings. Other: None. IMPRESSION: 1. No acute intracranial abnormality. 2. Chronic microvascular disease and brain atrophy. Electronically Signed   By: Kerby Moors M.D.   On: 07/18/2015 13:07   I have personally reviewed and evaluated these images and lab results as part of my medical decision-making.   EKG Interpretation   Date/Time:  Tuesday July 18 2015 13:32:43 EDT Ventricular Rate:  67 PR Interval:    QRS Duration: 89 QT Interval:  409 QTC Calculation: 432 R Axis:   37 Text Interpretation:  Sinus rhythm Nonspecific T abnrm, anterolateral  leads Baseline wander in lead(s) V4 no wpw, prolonged qt or brugada No  significant change since last tracing Confirmed by FLOYD MD, Quillian Quince  [phone]) on 07/18/2015 1:43:49 PM      MDM  I have reviewed and evaluated the relevant laboratory valuesI have reviewed and evaluated the relevant imaging studies. I have interpreted the relevant EKG.I have reviewed the relevant previous healthcare records.I have reviewed EMS Documentation.I obtained HPI from historian. Patient discussed with supervising physician  ED Course:  Assessment: Pt is a 60yF with hx Bipolar 1 Disorder, Neuromuscular Disorder, CKD, Stroke, AMS, Confusion, who presents s/p fall yesterday afternoon from standing. Orthostatics unlikely. She was outside so heat may be a factor. Dehydrated on labs. Seen in ED previously for similar event. Possible polypharmacy involvement. HPI unreliable due to confusion and AMS as noted in past. No family members present to verify baseline. On exam, pt in NAD. Nontoxic/nonseptic appearing. VSS. Afebrile. Lungs CTA. Heart RRR. Abdomen nontender soft. Noted slowed speech and confusion, but appears to be noted in Neurology visit x2 months ago  so is baseline. Labs show sodium 127. Given NS bolus. iStat trop negative. EKG unremarkable. CT head and DG lumbar unremarkable for acute abnormalities. Ammonia was drawn due to neuro note about encephalopathy 2/2 kidney dysfunction vs liver dysfunction, which was unremarkable. UA unremarkable. CM has seen. Patient has home health visits, but today is a holiday. Plan is to [place] with follow up to PCP. No acute pathology identified at this time. Discussed with supervising physician. At time of discharge, Patient is in no acute distress. Vital Signs are stable.  Patient is able to ambulate. Patient able to tolerate PO.    3:35 PM- Unable to get in contact with family member. Pt requires assistance at home. Difficult with holiday today. CM has seen in attempt to help get patient home, but recommends that if unable to get a hold of family that she will require observation admission. PTAR unable to transport home without family member present at home. If unable to get hold of family member. Pt will require observation admission.     Disposition/Plan:  4:07 PM Sign out: Corena Herter, PA-C Plan is to [place] if family member able to retrieve her. If not, will require observation admission.  Additional Verbal discharge instructions given and discussed with patient.  Pt Instructed to f/u with PCP in the next week for evaluation and treatment of symptoms. Return precautions given Pt acknowledges and agrees with plan  Supervising Physician Deno Etienne, DO   Final diagnoses:  Fall  Thrombocytopenia [facility])  Hyponatremia  White matter abnormality on MRI of brain    Shary Decamp, PA-C 07/18/15 [place], PA-C 07/18/15 [place], DO 07/19/15 [phone]

## 2015-07-18 NOTE — ED Notes (Signed)
Pt reports to the ED for eval low back pain following a fall that occurred yesterday. She reports she took some medication and was able to get up and work in the yard then she woke up this am and she was sore and having low back pain. Pt reports she tripped. Denies any syncope. She did hit her posterior head. Unknown LOC. Denies any blood thinner use. Denies any numbness, tingling, paralysis, or bowel or bladder incontience. Pupils pinpoint and patient seems lethargic and slow to respond. She states she cannot remember if she took any pain medication. CBG 167 mg/dl. Pt A&Ox person, place, and situation, disoriented to time. Resp e/u and skin warm and dry.

## 2015-07-18 NOTE — Discharge Instructions (Signed)
Please read and follow all provided instructions.  Your diagnoses today include:  1. Fall    Tests performed today include:  Vital signs. See below for your results today.   Medications prescribed:   Take as prescribed   Home care instructions:  Follow any educational materials contained in this packet.  Follow-up instructions: Please follow-up with your primary care provider for further evaluation of symptoms and treatment   Return instructions:   Please return to the Emergency Department if you do not get better, if you get worse, or new symptoms OR  - Fever (temperature greater than 101.67F)  - Bleeding that does not stop with holding pressure to the area    -Severe pain (please note that you may be more sore the day after your accident)  - Chest Pain  - Difficulty breathing  - Severe nausea or vomiting  - Inability to tolerate food and liquids  - Passing out  - Skin becoming red around your wounds  - Change in mental status (confusion or lethargy)  - New numbness or weakness     Please return if you have any other emergent concerns.  Additional Information:  Your vital signs today were: BP 152/83 mmHg   Pulse 67   Temp(Src) 97.4 F (36.3 C)   Resp 22   SpO2 100% If your blood pressure (BP) was elevated above 135/85 this visit, please have this repeated by your doctor within one month. ---------------

## 2015-07-18 NOTE — ED Notes (Signed)
Brother is on the way.

## 2015-07-18 NOTE — ED Provider Notes (Signed)
Care assumed from [facility], PA-C, at shift change. Pt here with fall yesterday; pt poor historian, has some global encephalopathy causing some slight AMS at baseline. Results so far show ammonia WNL, trop WNL, EKG nonischemic, BMP with mildly low Na 127 (somewhat lower than prior), CBC with baseline H/H and low plt, lumbar xray neg, CT head neg. U/A collected but not yet in process (although no urinary symptoms)-- appears it was spilled, they will try to get another sample now.   She was going to be discharged by [facility] but no family could be reached to get her, or even to be home when she arrived via [place]. SW was consulted and stated that if we can't get ahold of family then she can't go home and would need obs admit until family responded. She has home health but since it's a holiday they can't come out today to be with her, and she can't go home alone. Plan is to try to get ahold of family in order to be able to discharge this pt; if not then may need admission (and given mildly low Na this could be an indication to observe her overnight).   UPDATE: family on their way. U/A in process. If it doesn't return before family arrives, then the lab will call her later if it looks like she needs abx.  Physical Exam  BP 152/70 mmHg  Pulse 72  Temp(Src) 97.4 F (36.3 C)  Resp 17  SpO2 96%  Physical Exam Gen: afebrile, VSS, NAD, eating lunch HEENT: EOMI, MMM Resp: no resp distress CV: rate WNL Abd: appearance normal, nondistended MsK: moving all extremities with ease Neuro: A&O  ED Course  Procedures Results for orders placed or performed during the hospital encounter of 0000000  Basic metabolic panel  Result Value Ref Range   Sodium 127 (L) 135 - 145 mmol/L   Potassium 4.2 3.5 - 5.1 mmol/L   Chloride 91 (L) 101 - 111 mmol/L   CO2 30 22 - 32 mmol/L   Glucose, Bld 84 65 - 99 mg/dL   BUN 16 6 - 20 mg/dL   Creatinine, Ser 0.57 0.44 - 1.00 mg/dL   Calcium 9.8 8.9 - 10.3 mg/dL   GFR calc non  Af Amer >60 >60 mL/min   GFR calc Af Amer >60 >60 mL/min   Anion gap 6 5 - 15  CBC  Result Value Ref Range   WBC 6.2 4.0 - 10.5 K/uL   RBC 3.80 (L) 3.87 - 5.11 MIL/uL   Hemoglobin 11.9 (L) 12.0 - 15.0 g/dL   HCT 35.2 (L) 36.0 - 46.0 %   MCV 92.6 78.0 - 100.0 fL   MCH 31.3 26.0 - 34.0 pg   MCHC 33.8 30.0 - 36.0 g/dL   RDW 14.7 11.5 - 15.5 %   Platelets 104 (L) 150 - 400 K/uL  Ammonia  Result Value Ref Range   Ammonia 29 9 - 35 umol/L  Urinalysis, Routine w reflex microscopic (not at [facility])  Result Value Ref Range   Color, Urine YELLOW YELLOW   APPearance CLEAR CLEAR   Specific Gravity, Urine 1.012 1.005 - 1.030   pH 7.0 5.0 - 8.0   Glucose, UA NEGATIVE NEGATIVE mg/dL   Hgb urine dipstick NEGATIVE NEGATIVE   Bilirubin Urine NEGATIVE NEGATIVE   Ketones, ur NEGATIVE NEGATIVE mg/dL   Protein, ur NEGATIVE NEGATIVE mg/dL   Nitrite NEGATIVE NEGATIVE   Leukocytes, UA NEGATIVE NEGATIVE  I-Stat Troponin, ED (not at [facility])  Result Value  Ref Range   Troponin i, poc 0.00 0.00 - 0.08 ng/mL   Comment 3           Dg Lumbar Spine Complete  07/18/2015  CLINICAL DATA:  Fall onto back X1 day ago; c/o posterior lower back pain radiating across to bilateral posterior hips EXAM: LUMBAR SPINE - COMPLETE 4+ VIEW COMPARISON:  None. FINDINGS: No fracture. Minimal anterolisthesis of L4 on L5. No other spondylolisthesis. Mild loss disc height at L1-L2, L2-L3 and L3-L4. Moderate loss disc height at L4-L5 and L5-S1. Bones are demineralized. There are calcifications along a normal caliber abdominal aorta. IMPRESSION: No fracture or acute finding.  Degenerative changes as described. Electronically Signed   By: Lajean Manes M.D.   On: 07/18/2015 13:13   Ct Head Wo Contrast  07/18/2015  CLINICAL DATA:  Status post fall EXAM: CT HEAD WITHOUT CONTRAST TECHNIQUE: Contiguous axial images were obtained from the base of the skull through the vertex without intravenous contrast. COMPARISON:  07/10/2015 FINDINGS: Brain: No  evidence of acute infarction, hemorrhage, extra-axial collection, ventriculomegaly, or mass effect.There is low attenuation throughout the subcortical and periventricular white matter compatible with chronic microvascular disease. Prominence of the sulci and ventricles noted consistent with brain atrophy. Chronic infarct within the right cerebellar hemisphere is unchanged from previous exam. Vascular: No hyperdense vessel or unexpected calcification. Skull: Negative for fracture or focal lesion. Sinuses/Orbits: No acute findings. Other: None. IMPRESSION: 1. No acute intracranial abnormality. 2. Chronic microvascular disease and brain atrophy. Electronically Signed   By: Kerby Moors M.D.   On: 07/18/2015 13:07   Ct Head Wo Contrast  07/10/2015  CLINICAL DATA:  Urinary tract infection last month.  Unsteady gait. EXAM: CT HEAD WITHOUT CONTRAST TECHNIQUE: Contiguous axial images were obtained from the base of the skull through the vertex without intravenous contrast. COMPARISON:  06/07/2015 FINDINGS: Stable lacunar infarct, right cerebellum, image 11/2, 1.5 by 0.4 cm. Subtle hypodensity in the left upper pons on image 13/2, favor artifact. Otherwise, the brainstem, cerebellum, cerebral peduncles, thalami, basal ganglia, basilar cisterns, and ventricular system appear within normal limits. Periventricular white matter and corona radiata hypodensities favor chronic ischemic microvascular white matter disease. No intracranial hemorrhage, mass lesion, or acute CVA. IMPRESSION: 1. No acute intracranial findings. 2. Periventricular white matter and corona radiata hypodensities favor chronic ischemic microvascular white matter disease. 3. 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 Electronically Signed   By: Van Clines M.D.   On: 07/10/2015 12:13   EKG Interpretation  Date/Time:  Tuesday July 18 2015 13:32:43 EDT Ventricular Rate:  67 PR Interval:    QRS Duration: 89 QT Interval:  409 QTC  Calculation: 432 R Axis:   37 Text Interpretation:  Sinus rhythm Nonspecific T abnrm, anterolateral leads Baseline wander in lead(s) V4 no wpw, prolonged qt or brugada No significant change since last tracing Confirmed by FLOYD MD, DANIEL [phone]) on 07/18/2015 1:43:49 PM   Meds ordered this encounter  Medications  . sodium chloride 0.9 % bolus 1,000 mL    Sig:   . traZODone (DESYREL) 100 MG tablet    Sig: Take 200 mg by mouth at bedtime.     MDM:   ICD-9-CM ICD-10-CM   1. Fall E888.9 W19.XXXA CT HEAD WO CONTRAST     CT HEAD WO CONTRAST  2. Thrombocytopenia (HCC) 287.5 D69.6   3. Hyponatremia 276.1 E87.1   4. White matter abnormality on MRI of brain 793.0 R93.0     4:45 PM Family here to pick her  up. U/A still in process, if it's concerning and needing abx then will call, but if not then no further tx needed. Pt instructed to f/up with her neurologist in 3-5 days for ongoing management of her recurrent falls. I explained the diagnosis and have given explicit precautions to return to the ER including for any other new or worsening symptoms. The patient understands and accepts the medical plan as it's been dictated and I have answered their questions. Discharge instructions concerning home care and prescriptions have been given. The patient is STABLE and is discharged to home in good condition.   4:51 PM U/A perfectly WNL, no evidence of UTI. No need to call back for tx.    BP 148/75 mmHg  Pulse 68  Temp(Src) 97.4 F (36.3 C)  Resp 15  SpO2 95%   Keyuna Cuthrell Camprubi-Soms, PA-C 07/18/15 [place], DO 07/19/15 [id number]

## 2015-07-19 ENCOUNTER — Telehealth: Payer: Self-pay | Admitting: *Deleted

## 2015-07-19 LAB — URINE CULTURE

## 2015-10-05 ENCOUNTER — Emergency Department (HOSPITAL_COMMUNITY)
Admission: EM | Admit: 2015-10-05 | Discharge: 2015-10-05 | Disposition: A | Discharge status: Home / Self Care | Payer: Medicaid Other | Attending: Dermatology | Admitting: Dermatology

## 2015-10-05 ENCOUNTER — Encounter (HOSPITAL_COMMUNITY): Payer: Self-pay | Admitting: Emergency Medicine

## 2015-10-05 DIAGNOSIS — N189 Chronic kidney disease, unspecified: Secondary | ICD-10-CM | POA: Insufficient documentation

## 2015-10-05 DIAGNOSIS — F17210 Nicotine dependence, cigarettes, uncomplicated: Secondary | ICD-10-CM | POA: Insufficient documentation

## 2015-10-05 DIAGNOSIS — F329 Major depressive disorder, single episode, unspecified: Secondary | ICD-10-CM | POA: Diagnosis not present

## 2015-10-05 DIAGNOSIS — Z7982 Long term (current) use of aspirin: Secondary | ICD-10-CM | POA: Insufficient documentation

## 2015-10-05 DIAGNOSIS — M79604 Pain in right leg: Secondary | ICD-10-CM | POA: Diagnosis present

## 2015-10-05 DIAGNOSIS — Z5321 Procedure and treatment not carried out due to patient leaving prior to being seen by health care provider: Secondary | ICD-10-CM | POA: Insufficient documentation

## 2015-10-05 DIAGNOSIS — Z79899 Other long term (current) drug therapy: Secondary | ICD-10-CM | POA: Insufficient documentation

## 2015-10-05 NOTE — ED Triage Notes (Signed)
Pt was at [facility] today to get her shot. They wanted her evaluated,  Pt is weak, clumsy, hallucination yesterday.

## 2015-10-05 NOTE — ED Notes (Signed)
Called name times one to go to room.  No answer.

## 2015-10-05 NOTE — ED Triage Notes (Signed)
Pt states she is in so much pain right leg. Friend states she lives with her brother and he said she took a bottle of aleve of pain yesterday. Pt stated " it helps"

## 2015-11-24 ENCOUNTER — Other Ambulatory Visit (HOSPITAL_COMMUNITY): Payer: Self-pay | Admitting: Family

## 2015-11-24 ENCOUNTER — Ambulatory Visit (HOSPITAL_COMMUNITY)
Admission: RE | Admit: 2015-11-24 | Discharge: 2015-11-24 | Disposition: A | Discharge status: Home / Self Care | Payer: Medicaid Other | Source: Ambulatory Visit | Attending: Family | Admitting: Family

## 2015-11-24 DIAGNOSIS — M48061 Spinal stenosis, lumbar region without neurogenic claudication: Secondary | ICD-10-CM | POA: Diagnosis not present

## 2015-11-24 DIAGNOSIS — M545 Low back pain: Secondary | ICD-10-CM

## 2015-11-24 DIAGNOSIS — M25551 Pain in right hip: Secondary | ICD-10-CM

## 2015-11-24 DIAGNOSIS — I700 Atherosclerosis of aorta: Secondary | ICD-10-CM | POA: Diagnosis not present

## 2015-11-30 ENCOUNTER — Encounter (HOSPITAL_COMMUNITY): Payer: Self-pay | Admitting: *Deleted

## 2015-11-30 ENCOUNTER — Emergency Department (HOSPITAL_COMMUNITY): Payer: Medicaid Other

## 2015-11-30 ENCOUNTER — Emergency Department (HOSPITAL_COMMUNITY)
Admission: EM | Admit: 2015-11-30 | Discharge: 2015-11-30 | Disposition: A | Discharge status: Home / Self Care | Payer: Medicaid Other | Attending: Emergency Medicine | Admitting: Emergency Medicine

## 2015-11-30 DIAGNOSIS — Z79899 Other long term (current) drug therapy: Secondary | ICD-10-CM | POA: Insufficient documentation

## 2015-11-30 DIAGNOSIS — R262 Difficulty in walking, not elsewhere classified: Secondary | ICD-10-CM | POA: Insufficient documentation

## 2015-11-30 DIAGNOSIS — N189 Chronic kidney disease, unspecified: Secondary | ICD-10-CM | POA: Insufficient documentation

## 2015-11-30 DIAGNOSIS — R4182 Altered mental status, unspecified: Secondary | ICD-10-CM | POA: Insufficient documentation

## 2015-11-30 DIAGNOSIS — F17210 Nicotine dependence, cigarettes, uncomplicated: Secondary | ICD-10-CM | POA: Diagnosis not present

## 2015-11-30 DIAGNOSIS — Z7982 Long term (current) use of aspirin: Secondary | ICD-10-CM | POA: Diagnosis not present

## 2015-11-30 LAB — COMPREHENSIVE METABOLIC PANEL
ALBUMIN: 3.5 g/dL (ref 3.5–5.0)
ALT: 15 U/L (ref 14–54)
AST: 19 U/L (ref 15–41)
Alkaline Phosphatase: 71 U/L (ref 38–126)
Anion gap: 7 (ref 5–15)
BILIRUBIN TOTAL: 0.6 mg/dL (ref 0.3–1.2)
BUN: 10 mg/dL (ref 6–20)
CHLORIDE: 91 mmol/L — AB (ref 101–111)
CO2: 31 mmol/L (ref 22–32)
Calcium: 9.5 mg/dL (ref 8.9–10.3)
Creatinine, Ser: 0.44 mg/dL (ref 0.44–1.00)
GFR calc Af Amer: >60 mL/min (ref >60)
GFR calc non Af Amer: >60 mL/min (ref >60)
GLUCOSE: 92 mg/dL (ref 65–99)
POTASSIUM: 4 mmol/L (ref 3.5–5.1)
Sodium: 129 mmol/L — ABNORMAL LOW (ref 135–145)
Total Protein: 6.3 g/dL — ABNORMAL LOW (ref 6.5–8.1)

## 2015-11-30 LAB — CBC WITH DIFFERENTIAL/PLATELET
Basophils Absolute: 0 10*3/uL (ref 0.0–0.1)
Basophils Relative: 0 %
Eosinophils Absolute: 0.1 10*3/uL (ref 0.0–0.7)
Eosinophils Relative: 1 %
HCT: 38.5 % (ref 36.0–46.0)
HEMOGLOBIN: 13.5 g/dL (ref 12.0–15.0)
LYMPHS ABS: 2.3 10*3/uL (ref 0.7–4.0)
LYMPHS PCT: 32 %
MCH: 32.4 pg (ref 26.0–34.0)
MCHC: 35.1 g/dL (ref 30.0–36.0)
MCV: 92.3 fL (ref 78.0–100.0)
Monocytes Absolute: 1 10*3/uL (ref 0.1–1.0)
Monocytes Relative: 14 %
NEUTROS ABS: 3.8 10*3/uL (ref 1.7–7.7)
NEUTROS PCT: 53 %
PLATELETS: 111 10*3/uL — AB (ref 150–400)
RBC: 4.17 MIL/uL (ref 3.87–5.11)
RDW: 13.5 % (ref 11.5–15.5)
SMEAR REVIEW: ADEQUATE
WBC: 7.1 10*3/uL (ref 4.0–10.5)

## 2015-11-30 LAB — TSH: TSH: 0.695 u[IU]/mL (ref 0.350–4.500)

## 2015-11-30 LAB — TROPONIN I: Troponin I: <0.03 ng/mL (ref <0.03)

## 2015-11-30 LAB — CBG MONITORING, ED: GLUCOSE-CAPILLARY: 80 mg/dL (ref 65–99)

## 2015-11-30 LAB — URINALYSIS, ROUTINE W REFLEX MICROSCOPIC
Bilirubin Urine: NEGATIVE
Glucose, UA: NEGATIVE mg/dL
HGB URINE DIPSTICK: NEGATIVE
KETONES UR: NEGATIVE mg/dL
LEUKOCYTES UA: NEGATIVE
Nitrite: NEGATIVE
PROTEIN: NEGATIVE mg/dL
Specific Gravity, Urine: <1.005 — ABNORMAL LOW (ref 1.005–1.030)
pH: 7.5 (ref 5.0–8.0)

## 2015-11-30 LAB — VALPROIC ACID LEVEL: VALPROIC ACID LVL: 85 ug/mL (ref 50.0–100.0)

## 2015-11-30 LAB — T4, FREE: Free T4: 1.18 ng/dL — ABNORMAL HIGH (ref 0.61–1.12)

## 2015-11-30 LAB — ACETAMINOPHEN LEVEL: Acetaminophen (Tylenol), Serum: <10 ug/mL — ABNORMAL LOW (ref 10–30)

## 2015-11-30 LAB — SALICYLATE LEVEL: Salicylate Lvl: <7 mg/dL (ref 2.8–30.0)

## 2015-11-30 MED ORDER — SODIUM CHLORIDE 0.9 % IV BOLUS (SEPSIS)
500.0000 mL | Freq: Once | INTRAVENOUS | Status: AC
Start: 2015-11-30 — End: 2015-11-30
  Administered 2015-11-30: 500 mL via INTRAVENOUS

## 2015-11-30 NOTE — ED Notes (Signed)
Ate breakfast tray.  Assisted to bathroom.  Pt ambulatory.

## 2015-11-30 NOTE — Discharge Instructions (Signed)
Take your medications as prescribed. USE YOUR WALKER SO YOU DON"T FALL. Talk to Dr. Merlene Laughter to find out why you are having the shuffling gait. Recheck as needed.

## 2015-11-30 NOTE — ED Notes (Signed)
Pt's brother called and stated he would be picking up pt if she is to be discharged;   Lorrin Goodell  [phone]

## 2015-11-30 NOTE — ED Notes (Signed)
Pt walked around nurses' station with some assistance; pt started c/o right lower leg pain as she was walking; Dr. Tomi Bamberger saw pt walking

## 2015-11-30 NOTE — ED Notes (Signed)
Pt's cbg with ems 120

## 2015-11-30 NOTE — ED Notes (Signed)
Pt assisted to bathroom and back to bed with little assistance 

## 2015-11-30 NOTE — ED Triage Notes (Signed)
Pt brought in by [facility] for c/o altered loc; pt states her alarm was going off (life alert);  Pt's brother found her lying in bed lethargic; pt is alert and able to answer to questions appropriately

## 2015-11-30 NOTE — ED Notes (Signed)
Spoke with pt's brother and informed him of pt's condition and the EDP's need to speak with him; pt states he will be here as soon as he can

## 2015-11-30 NOTE — ED Provider Notes (Signed)
[facility] DEPT Provider Note   CSN: [id number] Arrival date & time: 11/30/15  0135  Time seen 02:00 AM   History   Chief Complaint Chief Complaint  Patient presents with  . Altered Mental Status    HPI Cassidy Smith is a 60 y.o. female.  HPI  patient was brought to the emergency department after her panic button was pressed and her brother who lives in a house with her was contacted by their service. She states she was in bed. He reported to EMS that she was lethargic and he could not wake her up. She does not know how the panic button got pushed. She states that she felt fine today and she had gone to bed like usual. She states she took her usual medications in the morning. Her only other complaint is for the past 2-3 days she has been shuffling or "walking like chicken". She states she's been eating more and not less recently. She denies nausea, vomiting, diarrhea. She states she has some pain in her right hip and her right leg which is from arthritis which she's had for a long time. She denies any fever or coughing. She states she's taking her medications as usual. It should be noted however her risperidone was filled on October 22 and there is only 1 pill left.  PCP Bradd Burner at [facility] Pain management Dr Merlene Laughter Psych Dr Hoyle Barr   Past Medical History:  Diagnosis Date  . Anxiety   . Bipolar 1 disorder ([place])   . Chronic hip pain   . Chronic kidney disease    uti  currently,  hx bladder spasms  . Depression   . Headache(784.0)   . Neuromuscular disorder (HCC)    shaking of hands   . Stroke [facility])     Patient Active Problem List   Diagnosis Date Noted  . Orthostatic hypotension 06/07/2015  . UTI (urinary tract infection) 06/07/2015  . Rhabdomyolysis 06/07/2015  . White matter abnormality on MRI of brain 05/14/2013  . History of depression 05/14/2013  . Hx of anxiety disorder 05/14/2013  . Bipolar I disorder, most recent episode (or  current) manic, unspecified 05/14/2013  . Bipolar 1 disorder ([place]) 05/14/2013  . Hyponatremia 05/12/2013  . Bipolar disorder ([place]) 05/12/2013  . Lacunar infarct, acute ([place]) 05/12/2013    Past Surgical History:  Procedure Laterality Date  . MULTIPLE EXTRACTIONS WITH ALVEOLOPLASTY N/A 10/30/2012   Procedure: MULTIPLE EXTRACION 5, 6, 8, 9, 10 ,18, 19, 31 WITH MAXILLARY RIGHT AND LEFT  ALVEOLOPLASTY REDUCE MAXILLARY LEFT TUBEROSITY;  Surgeon: Gae Bon, DDS;  Location: [facility];  Service: Oral Surgery;  Laterality: N/A;    OB History    No data available       Home Medications    Prior to Admission medications   Medication Sig Start Date End Date Taking? Authorizing Provider  aspirin EC 81 MG EC tablet Take 1 tablet (81 mg total) by mouth daily. 05/15/13   Samuella Cota, MD  benztropine (COGENTIN) 1 MG tablet Take 1 mg by mouth 2 (two) times daily.    Historical Provider, MD  clonazePAM (KLONOPIN) 0.5 MG tablet Take 0.5 mg by mouth at bedtime.    Historical Provider, MD  divalproex (DEPAKOTE ER) 500 MG 24 hr tablet Take 1,500 mg by mouth at bedtime.     Historical Provider, MD  gabapentin (NEURONTIN) 300 MG capsule Take 300 mg by mouth 3 (three) times daily. 07/04/15   Historical Provider,  MD  midodrine (PROAMATINE) 10 MG tablet Take 1 tablet (10 mg total) by mouth 3 (three) times daily with meals. 06/11/15   Lavina Hamman, MD  Multiple Vitamins-Minerals (CENTRUM SILVER ADULT 50+ PO) Take 1 tablet by mouth every morning.    Historical Provider, MD  naproxen (NAPROSYN) 500 MG tablet Take 500 mg by mouth 2 (two) times daily as needed for mild pain or moderate pain.    Historical Provider, MD  nicotine (NICODERM CQ - DOSED IN MG/24 HOURS) 21 mg/24hr patch Place 1 patch (21 mg total) onto the skin daily. 06/11/15   Lavina Hamman, MD  polyethylene glycol (MIRALAX / Floria Raveling) packet Take 17 g by mouth daily. 06/11/15   Lavina Hamman, MD  RISPERDAL CONSTA 50 MG injection Inject 50 mg into  the muscle every 14 (fourteen) days. 10/24/14   Historical Provider, MD  risperidone (RISPERDAL) 4 MG tablet Take 4 mg by mouth at bedtime. 06/22/15   Historical Provider, MD  traMADol (ULTRAM) 50 MG tablet Take 1-2 tablets by mouth every 6 (six) hours.  07/04/15   Historical Provider, MD  traZODone (DESYREL) 100 MG tablet Take 200 mg by mouth at bedtime.    Historical Provider, MD  vitamin B-12 (CYANOCOBALAMIN) 1000 MCG tablet Take 1,000 mcg by mouth daily.    Historical Provider, MD    Family History Family History  Problem Relation Age of Onset  . Breast cancer Mother   . Alcoholism Father     Social History Social History  Substance Use Topics  . Smoking status: Current Every Day Smoker    Packs/day: 0.50    Years: 20.00    Types: Cigarettes  . Smokeless tobacco: Never Used  . Alcohol use No  lives with brother   Allergies   Patient has no known allergies.   Review of Systems Review of Systems  All other systems reviewed and are negative.    Physical Exam Updated Vital Signs BP (!) 160/138   Pulse 71   Temp 97.5 F (36.4 C) (Oral)   Resp 18   Ht 5\' 7"  (1.702 m)   Wt 109 lb (49.4 kg)   SpO2 93%   BMI 17.07 kg/m   Vital signs normal except hypertension   Physical Exam  Constitutional: She is oriented to person, place, and time. She appears well-developed and well-nourished.  Non-toxic appearance. She does not appear ill. No distress.  HENT:  Head: Normocephalic and atraumatic.  Right Ear: External ear normal.  Left Ear: External ear normal.  Nose: Nose normal. No mucosal edema or rhinorrhea.  Mouth/Throat: Oropharynx is clear and moist and mucous membranes are normal. No dental abscesses or uvula swelling.  Eyes: Conjunctivae and EOM are normal. Pupils are equal, round, and reactive to light.  Neck: Normal range of motion and full passive range of motion without pain. Neck supple.  Cardiovascular: Normal rate, regular rhythm and normal heart sounds.  Exam  reveals no gallop and no friction rub.   No murmur heard. Pulmonary/Chest: Effort normal and breath sounds normal. No respiratory distress. She has no wheezes. She has no rhonchi. She has no rales. She exhibits no tenderness and no crepitus.  Abdominal: Soft. Normal appearance and bowel sounds are normal. She exhibits no distension. There is no tenderness. There is no rebound and no guarding.  Musculoskeletal: Normal range of motion. She exhibits no edema or tenderness.  Moves all extremities well.   Neurological: She is alert and oriented to person, place, and time.  She has normal strength. No cranial nerve deficit.  Pt thinks she is at [facility]  Skin: Skin is warm, dry and intact. No rash noted. No erythema. No pallor.  Psychiatric: She has a normal mood and affect. Her speech is normal and behavior is normal. Her mood appears not anxious.  Nursing note and vitals reviewed.    ED Treatments / Results  Labs (all labs ordered are listed, but only abnormal results are displayed) Results for orders placed or performed during the hospital encounter of 11/30/15  Comprehensive metabolic panel  Result Value Ref Range   Sodium 129 (L) 135 - 145 mmol/L   Potassium 4.0 3.5 - 5.1 mmol/L   Chloride 91 (L) 101 - 111 mmol/L   CO2 31 22 - 32 mmol/L   Glucose, Bld 92 65 - 99 mg/dL   BUN 10 6 - 20 mg/dL   Creatinine, Ser 0.44 0.44 - 1.00 mg/dL   Calcium 9.5 8.9 - 10.3 mg/dL   Total Protein 6.3 (L) 6.5 - 8.1 g/dL   Albumin 3.5 3.5 - 5.0 g/dL   AST 19 15 - 41 U/L   ALT 15 14 - 54 U/L   Alkaline Phosphatase 71 38 - 126 U/L   Total Bilirubin 0.6 0.3 - 1.2 mg/dL   GFR calc non Af Amer >60 >60 mL/min   GFR calc Af Amer >60 >60 mL/min   Anion gap 7 5 - 15  Acetaminophen level  Result Value Ref Range   Acetaminophen (Tylenol), Serum <10 (L) 10 - 30 ug/mL  Salicylate level  Result Value Ref Range   Salicylate Lvl [id number] 2.8 - 30.0 mg/dL  Troponin I  Result Value Ref Range   Troponin I <0.03 <0.03  ng/mL  CBC with Differential  Result Value Ref Range   WBC 7.1 4.0 - 10.5 K/uL   RBC 4.17 3.87 - 5.11 MIL/uL   Hemoglobin 13.5 12.0 - 15.0 g/dL   HCT 38.5 36.0 - 46.0 %   MCV 92.3 78.0 - 100.0 fL   MCH 32.4 26.0 - 34.0 pg   MCHC 35.1 30.0 - 36.0 g/dL   RDW 13.5 11.5 - 15.5 %   Platelets 111 (L) 150 - 400 K/uL   Neutrophils Relative % 53 %   Neutro Abs 3.8 1.7 - 7.7 K/uL   Lymphocytes Relative 32 %   Lymphs Abs 2.3 0.7 - 4.0 K/uL   Monocytes Relative 14 %   Monocytes Absolute 1.0 0.1 - 1.0 K/uL   Eosinophils Relative 1 %   Eosinophils Absolute 0.1 0.0 - 0.7 K/uL   Basophils Relative 0 %   Basophils Absolute 0.0 0.0 - 0.1 K/uL   WBC Morphology ATYPICAL LYMPHOCYTES    RBC Morphology BASOPHILIC STIPPLING    Smear Review PLATELETS APPEAR ADEQUATE   Valproic acid level  Result Value Ref Range   Valproic Acid Lvl 85 50.0 - 100.0 ug/mL  Urinalysis, Routine w reflex microscopic  Result Value Ref Range   Color, Urine YELLOW YELLOW   APPearance CLEAR CLEAR   Specific Gravity, Urine <1.005 (L) 1.005 - 1.030   pH 7.5 5.0 - 8.0   Glucose, UA NEGATIVE NEGATIVE mg/dL   Hgb urine dipstick NEGATIVE NEGATIVE   Bilirubin Urine NEGATIVE NEGATIVE   Ketones, ur NEGATIVE NEGATIVE mg/dL   Protein, ur NEGATIVE NEGATIVE mg/dL   Nitrite NEGATIVE NEGATIVE   Leukocytes, UA NEGATIVE NEGATIVE  CBG monitoring, ED  Result Value Ref Range   Glucose-Capillary 80 65 - 99 mg/dL  Laboratory interpretation all normal except Chronic hyponatremia and low chloride    EKG  EKG Interpretation  Date/Time:  Thursday November 30 2015 01:52:25 EST Ventricular Rate:  73 PR Interval:    QRS Duration: 100 QT Interval:  398 QTC Calculation: 439 R Axis:   31 Text Interpretation:  Sinus rhythm Probable left atrial enlargement RSR' in V1 or V2, right VCD or RVH Inferior infarct, old Minimal ST elevation, anterior leads Lateral leads are also involved No significant change since last tracing 18 Jul 2015 Confirmed  by Sarenity Ramaker  MD-I, Shelsy Seng ([id number]) on 11/30/2015 3:12:53 AM       Radiology Ct Head Wo Contrast  Result Date: 11/30/2015 CLINICAL DATA:  Altered mental status. EXAM: CT HEAD WITHOUT CONTRAST TECHNIQUE: Contiguous axial images were obtained from the base of the skull through the vertex without intravenous contrast. COMPARISON:  07/18/2015 FINDINGS: Brain: There is no intracranial hemorrhage, mass or evidence of acute infarction. There is moderate generalized atrophy. There is periventricular white matter hypodensity which is chronic and likely due to small vessel ischemic disease. Vascular: No hyperdense vessel or unexpected calcification. Skull: Normal. Negative for fracture or focal lesion. Sinuses/Orbits: No acute finding. Other: None. IMPRESSION: No acute intracranial findings. There is moderate generalized atrophy and chronic appearing white matter hypodensities which likely represent small vessel ischemic disease. Electronically Signed   By: Andreas Newport M.D.   On: 11/30/2015 02:56   Dg Hip Unilat With Pelvis 2-3 Views Right  Result Date: 11/24/2015 CLINICAL DATA:  Low back and right hip pain for the past 6 months. Multiple falls over the past 6 months.l. IMPRESSION: There is no acute or significant chronic bony abnormality of the right hip. Electronically Signed   By: David  Martinique M.D.   On: 11/24/2015 11:40   Dg Lumbar Spine Complete  Result Date: 11/24/2015 CLINICAL DATA:  Six-month history of low back and right hip pain. Patient has experienced multiple falls over the past 6 months but none recently. MPRESSION: New 20% anterior wedge compression of L1. Stable mild multilevel degenerative disc space narrowing. Abdominal aortic atherosclerosis. Electronically Signed   By: David  Martinique M.D.   On: 11/24/2015 11:39    Procedures Procedures (including critical care time)  Medications Ordered in ED Medications  sodium chloride 0.9 % bolus 500 mL (0 mLs Intravenous Stopped 11/30/15 0330)       Initial Impression / Assessment and Plan / ED Course  I have reviewed the triage vital signs and the nursing notes.  Pertinent labs & imaging results that were available during my care of the patient were reviewed by me and considered in my medical decision making (see chart for details).  Clinical Course    Patient was ambulated and is noted to have a very short stride shuffling gait and she appears very unsteady however she's walking in flip-flops. I'm going to suggest her brother get her a walker. She probably needs a neurology evaluation for her gait abnormality.  Patient is awake and alert at time of discharge. She is much more awake than when I first saw her. She denies taking her medication other than hallux prescribed. She states she does have a cane or walker and she is encouraged to use them. When I review her medication she RBC is Dr. Merlene Laughter. When I ask her if she's talked to him about her difficulty walking she states "he states he don't want to give him anything strong". Which does not seem to address the issue.   Final  Clinical Impressions(s) / ED Diagnoses   Final diagnoses:  Trouble walking  Altered mental status, unspecified altered mental status type   Plan discharge  Rolland Porter, MD, Barbette Or, MD 11/30/15 [phone]

## 2015-12-01 ENCOUNTER — Other Ambulatory Visit (HOSPITAL_COMMUNITY): Payer: Self-pay | Admitting: Family

## 2015-12-01 DIAGNOSIS — M8580 Other specified disorders of bone density and structure, unspecified site: Secondary | ICD-10-CM

## 2015-12-13 ENCOUNTER — Ambulatory Visit (HOSPITAL_COMMUNITY)
Admission: RE | Admit: 2015-12-13 | Discharge: 2015-12-13 | Disposition: A | Discharge status: Home / Self Care | Payer: Medicaid Other | Source: Ambulatory Visit | Attending: Family | Admitting: Family

## 2015-12-13 DIAGNOSIS — M8580 Other specified disorders of bone density and structure, unspecified site: Secondary | ICD-10-CM | POA: Diagnosis present

## 2015-12-13 DIAGNOSIS — M810 Age-related osteoporosis without current pathological fracture: Secondary | ICD-10-CM | POA: Insufficient documentation

## 2016-01-03 ENCOUNTER — Ambulatory Visit: Payer: Self-pay | Admitting: "Endocrinology

## 2016-01-26 ENCOUNTER — Encounter (HOSPITAL_COMMUNITY): Payer: Self-pay | Admitting: Emergency Medicine

## 2016-01-26 ENCOUNTER — Emergency Department (HOSPITAL_COMMUNITY)
Admission: EM | Admit: 2016-01-26 | Discharge: 2016-01-26 | Discharge status: Against Medical Advice | Payer: Medicaid Other | Attending: Emergency Medicine | Admitting: Emergency Medicine

## 2016-01-26 ENCOUNTER — Emergency Department (HOSPITAL_COMMUNITY): Payer: Medicaid Other

## 2016-01-26 DIAGNOSIS — N189 Chronic kidney disease, unspecified: Secondary | ICD-10-CM | POA: Diagnosis not present

## 2016-01-26 DIAGNOSIS — R062 Wheezing: Secondary | ICD-10-CM | POA: Insufficient documentation

## 2016-01-26 DIAGNOSIS — Z7982 Long term (current) use of aspirin: Secondary | ICD-10-CM | POA: Insufficient documentation

## 2016-01-26 DIAGNOSIS — R21 Rash and other nonspecific skin eruption: Secondary | ICD-10-CM | POA: Insufficient documentation

## 2016-01-26 DIAGNOSIS — Z79899 Other long term (current) drug therapy: Secondary | ICD-10-CM | POA: Diagnosis not present

## 2016-01-26 DIAGNOSIS — R059 Cough, unspecified: Secondary | ICD-10-CM

## 2016-01-26 DIAGNOSIS — R05 Cough: Secondary | ICD-10-CM | POA: Insufficient documentation

## 2016-01-26 DIAGNOSIS — F17210 Nicotine dependence, cigarettes, uncomplicated: Secondary | ICD-10-CM | POA: Insufficient documentation

## 2016-01-26 LAB — CBC WITH DIFFERENTIAL/PLATELET
BASOS ABS: 0 10*3/uL (ref 0.0–0.1)
BASOS PCT: 0 %
EOS ABS: 0 10*3/uL (ref 0.0–0.7)
EOS PCT: 0 %
HEMATOCRIT: 41 % (ref 36.0–46.0)
Hemoglobin: 13.6 g/dL (ref 12.0–15.0)
Lymphocytes Relative: 37 %
Lymphs Abs: 2.6 10*3/uL (ref 0.7–4.0)
MCH: 32.1 pg (ref 26.0–34.0)
MCHC: 33.2 g/dL (ref 30.0–36.0)
MCV: 96.7 fL (ref 78.0–100.0)
MONO ABS: 1.2 10*3/uL — AB (ref 0.1–1.0)
Monocytes Relative: 17 %
NEUTROS ABS: 3.2 10*3/uL (ref 1.7–7.7)
Neutrophils Relative %: 46 %
PLATELETS: 134 10*3/uL — AB (ref 150–400)
RBC: 4.24 MIL/uL (ref 3.87–5.11)
RDW: 13.8 % (ref 11.5–15.5)
WBC: 7.1 10*3/uL (ref 4.0–10.5)

## 2016-01-26 LAB — COMPREHENSIVE METABOLIC PANEL
ALBUMIN: 3.4 g/dL — AB (ref 3.5–5.0)
ALT: 18 U/L (ref 14–54)
ANION GAP: 7 (ref 5–15)
AST: 28 U/L (ref 15–41)
Alkaline Phosphatase: 65 U/L (ref 38–126)
BUN: 21 mg/dL — AB (ref 6–20)
CHLORIDE: 95 mmol/L — AB (ref 101–111)
CO2: 34 mmol/L — AB (ref 22–32)
Calcium: 9.4 mg/dL (ref 8.9–10.3)
Creatinine, Ser: 0.61 mg/dL (ref 0.44–1.00)
GFR calc Af Amer: >60 mL/min (ref >60)
GFR calc non Af Amer: >60 mL/min (ref >60)
GLUCOSE: 91 mg/dL (ref 65–99)
Potassium: 3.4 mmol/L — ABNORMAL LOW (ref 3.5–5.1)
SODIUM: 136 mmol/L (ref 135–145)
Total Bilirubin: 0.3 mg/dL (ref 0.3–1.2)
Total Protein: 6.7 g/dL (ref 6.5–8.1)

## 2016-01-26 LAB — INFLUENZA PANEL BY PCR (TYPE A & B)
Influenza A By PCR: NEGATIVE
Influenza B By PCR: NEGATIVE

## 2016-01-26 MED ORDER — SODIUM CHLORIDE 0.9 % IV BOLUS (SEPSIS)
1000.0000 mL | Freq: Once | INTRAVENOUS | Status: AC
  Administered 2016-01-26: 1000 mL via INTRAVENOUS

## 2016-01-26 NOTE — ED Notes (Signed)
Patient informed writer she wanted her IV taken out. Informed patient she was not ready for d/c yet. Patient still requesting to have IV taken out.

## 2016-01-26 NOTE — ED Notes (Signed)
Kerrie Pleasure, pt's cousin, can be reached at [phone] if needed.

## 2016-01-26 NOTE — ED Provider Notes (Signed)
[place] DEPT Provider Note   CSN: [id number] Arrival date & time: 01/26/16  [id number]  By signing my name below, I, Ephriam Jenkins, attest that this documentation has been prepared under the direction and in the presence of Milton Ferguson, MD. Electronically signed, Ephriam Jenkins, ED Scribe. 01/26/16. 10:30 AM.  History   Chief Complaint Chief Complaint  Patient presents with  . Cough  . Rash   HPI The history is provided by the patient. No language interpreter was used.  Cough  This is a new problem. The current episode started more than 1 week ago. The cough is productive of purulent sputum. There has been no fever. Associated symptoms include wheezing. Pertinent negatives include no chest pain and no headaches. She has tried nothing for the symptoms. She is a smoker.  Rash   This is a new problem. The current episode started more than 1 week ago. The problem is associated with nothing. There has been no fever. The rash is present on the face. The patient is experiencing no pain. The pain has been constant since onset.    Past Medical History:  Diagnosis Date  . Anxiety   . Bipolar 1 disorder ([place])   . Chronic hip pain   . Chronic kidney disease    uti  currently,  hx bladder spasms  . Depression   . Headache(784.0)   . Neuromuscular disorder (HCC)    shaking of hands   . Stroke [facility])     Patient Active Problem List   Diagnosis Date Noted  . Orthostatic hypotension 06/07/2015  . UTI (urinary tract infection) 06/07/2015  . Rhabdomyolysis 06/07/2015  . White matter abnormality on MRI of brain 05/14/2013  . History of depression 05/14/2013  . Hx of anxiety disorder 05/14/2013  . Bipolar I disorder, most recent episode (or current) manic, unspecified 05/14/2013  . Bipolar 1 disorder ([place]) 05/14/2013  . Hyponatremia 05/12/2013  . Bipolar disorder ([place]) 05/12/2013  . Lacunar infarct, acute ([place]) 05/12/2013    Past Surgical History:  Procedure Laterality Date  . MULTIPLE  EXTRACTIONS WITH ALVEOLOPLASTY N/A 10/30/2012   Procedure: MULTIPLE EXTRACION 5, 6, 8, 9, 10 ,18, 19, 31 WITH MAXILLARY RIGHT AND LEFT  ALVEOLOPLASTY REDUCE MAXILLARY LEFT TUBEROSITY;  Surgeon: Gae Bon, DDS;  Location: [facility];  Service: Oral Surgery;  Laterality: N/A;    OB History    No data available       Home Medications    Prior to Admission medications   Medication Sig Start Date End Date Taking? Authorizing Provider  alendronate (FOSAMAX) 70 MG tablet Take 1 tablet by mouth 2 (two) times a week. 01/18/16  Yes Historical Provider, MD  aspirin EC 81 MG EC tablet Take 1 tablet (81 mg total) by mouth daily. 05/15/13  Yes Samuella Cota, MD  benztropine (COGENTIN) 1 MG tablet Take 1 mg by mouth 2 (two) times daily.   Yes Historical Provider, MD  clonazePAM (KLONOPIN) 0.5 MG tablet Take 0.5 mg by mouth at bedtime.   Yes Historical Provider, MD  divalproex (DEPAKOTE ER) 500 MG 24 hr tablet Take 1,500 mg by mouth at bedtime.    Yes Historical Provider, MD  gabapentin (NEURONTIN) 300 MG capsule Take 300 mg by mouth 3 (three) times daily. 07/04/15  Yes Historical Provider, MD  hydrOXYzine (VISTARIL) 25 MG capsule Take 1 capsule by mouth every 8 (eight) hours as needed for itching. 01/24/16  Yes Historical Provider, MD  midodrine (PROAMATINE) 10 MG tablet Take 1 tablet (  10 mg total) by mouth 3 (three) times daily with meals. 06/11/15  Yes Lavina Hamman, MD  Multiple Vitamins-Minerals (CENTRUM SILVER ADULT 50+ PO) Take 1 tablet by mouth every morning.   Yes Historical Provider, MD  naproxen (NAPROSYN) 500 MG tablet Take 500 mg by mouth 2 (two) times daily as needed for mild pain or moderate pain.   Yes Historical Provider, MD  nicotine (NICODERM CQ - DOSED IN MG/24 HOURS) 21 mg/24hr patch Place 1 patch (21 mg total) onto the skin daily. 06/11/15  Yes Lavina Hamman, MD  RISPERDAL CONSTA 50 MG injection Inject 50 mg into the muscle every 14 (fourteen) days. 10/24/14  Yes Historical Provider, MD    risperidone (RISPERDAL) 4 MG tablet Take 4 mg by mouth at bedtime. 06/22/15  Yes Historical Provider, MD  traMADol (ULTRAM) 50 MG tablet Take 1-2 tablets by mouth every 6 (six) hours.  07/04/15  Yes Historical Provider, MD  traZODone (DESYREL) 100 MG tablet Take 200 mg by mouth at bedtime.   Yes Historical Provider, MD  vitamin B-12 (CYANOCOBALAMIN) 1000 MCG tablet Take 1,000 mcg by mouth daily.   Yes Historical Provider, MD  polyethylene glycol (MIRALAX / GLYCOLAX) packet Take 17 g by mouth daily. 06/11/15   Lavina Hamman, MD    Family History Family History  Problem Relation Age of Onset  . Breast cancer Mother   . Alcoholism Father     Social History Social History  Substance Use Topics  . Smoking status: Current Every Day Smoker    Packs/day: 0.50    Years: 20.00    Types: Cigarettes  . Smokeless tobacco: Never Used  . Alcohol use No    Allergies   Patient has no known allergies.   Review of Systems Review of Systems  Constitutional: Negative for appetite change and fatigue.  HENT: Negative for congestion, ear discharge and sinus pressure.   Eyes: Negative for discharge.  Respiratory: Positive for cough and wheezing.   Cardiovascular: Negative for chest pain.  Gastrointestinal: Negative for abdominal pain and diarrhea.  Genitourinary: Negative for frequency and hematuria.  Musculoskeletal: Negative for back pain.  Skin: Positive for rash.  Neurological: Negative for seizures and headaches.  Psychiatric/Behavioral: Negative for hallucinations.     Physical Exam Updated Vital Signs BP 134/68 (BP Location: Left Arm)   Pulse 90   Temp 98.5 F (36.9 C) (Oral)   Resp 20   Ht 5\' 6"  (1.676 m)   Wt 186 lb (84.4 kg)   SpO2 95%   BMI 30.02 kg/m   Physical Exam  Constitutional: She is oriented to person, place, and time. She appears well-developed.  HENT:  Head: Normocephalic.  Mucous membranes are dry  Eyes: Conjunctivae and EOM are normal. No scleral icterus.   Neck: Neck supple. No thyromegaly present.  Cardiovascular: Normal rate and regular rhythm.  Exam reveals no gallop and no friction rub.   No murmur heard. Pulmonary/Chest: Effort normal. No stridor. She has wheezes. She has no rales. She exhibits no tenderness.  Mild wheezing throughout all lung fields.   Abdominal: She exhibits no distension. There is no tenderness. There is no rebound.  Musculoskeletal: Normal range of motion. She exhibits no edema.  Lymphadenopathy:    She has no cervical adenopathy.  Neurological: She is oriented to person, place, and time. She exhibits normal muscle tone. Coordination normal.  Skin: Rash noted. No erythema.  Dry peeling rash around nose and mouth.   Psychiatric: She has a normal  mood and affect. Her behavior is normal.    ED Treatments / Results  DIAGNOSTIC STUDIES: Oxygen Saturation is 95% on RA, normal by my interpretation.  COORDINATION OF CARE: 10:25 AM-Discussed treatment plan with pt at bedside and pt agreed to plan.   Labs (all labs ordered are listed, but only abnormal results are displayed) Labs Reviewed - No data to display  EKG  EKG Interpretation None       Radiology No results found.  Procedures Procedures (including critical care time)  Medications Ordered in ED Medications - No data to display   Initial Impression / Assessment and Plan / ED Course  I have reviewed the triage vital signs and the nursing notes.  Pertinent labs & imaging results that were available during my care of the patient were reviewed by me and considered in my medical decision making (see chart for details).  Clinical Course     Patient presented with cough and rash. Labs unremarkable test negative chest x-ray shows some COPD but no pneumonia. I went back to discuss the results of the test with the patient and she had left AMA  Final Clinical Impressions(s) / ED Diagnoses   Final diagnoses:  None    New Prescriptions New  Prescriptions   No medications on file  The chart was scribed for me under my direct supervision.  I personally performed the history, physical, and medical decision making and all procedures in the evaluation of this patient.Milton Ferguson, MD 01/26/16 1414

## 2016-01-26 NOTE — ED Triage Notes (Signed)
Patient complains of cough that started 2 weeks ago; also complains of rash on face that started 2 weeks ago. States history of rosacea.

## 2016-01-26 NOTE — ED Notes (Addendum)
Unable to locate patient in room. Dr. Roderic Palau aware.

## 2017-03-06 ENCOUNTER — Emergency Department (HOSPITAL_COMMUNITY): Payer: Medicaid Other

## 2017-03-06 ENCOUNTER — Inpatient Hospital Stay (HOSPITAL_COMMUNITY)
Admission: EM | Admit: 2017-03-06 | Discharge: 2017-03-14 | DRG: 481 | Disposition: A | Discharge status: Skilled Nursing Facility | Payer: Medicaid Other | Attending: Internal Medicine | Admitting: Internal Medicine

## 2017-03-06 ENCOUNTER — Encounter (HOSPITAL_COMMUNITY): Payer: Self-pay | Admitting: Cardiology

## 2017-03-06 ENCOUNTER — Other Ambulatory Visit: Payer: Self-pay

## 2017-03-06 DIAGNOSIS — I82409 Acute embolism and thrombosis of unspecified deep veins of unspecified lower extremity: Secondary | ICD-10-CM | POA: Diagnosis not present

## 2017-03-06 DIAGNOSIS — D638 Anemia in other chronic diseases classified elsewhere: Secondary | ICD-10-CM | POA: Diagnosis present

## 2017-03-06 DIAGNOSIS — K5900 Constipation, unspecified: Secondary | ICD-10-CM | POA: Diagnosis present

## 2017-03-06 DIAGNOSIS — E876 Hypokalemia: Secondary | ICD-10-CM | POA: Diagnosis present

## 2017-03-06 DIAGNOSIS — D649 Anemia, unspecified: Secondary | ICD-10-CM | POA: Diagnosis not present

## 2017-03-06 DIAGNOSIS — Z79899 Other long term (current) drug therapy: Secondary | ICD-10-CM | POA: Diagnosis not present

## 2017-03-06 DIAGNOSIS — E0590 Thyrotoxicosis, unspecified without thyrotoxic crisis or storm: Secondary | ICD-10-CM | POA: Diagnosis present

## 2017-03-06 DIAGNOSIS — W19XXXA Unspecified fall, initial encounter: Secondary | ICD-10-CM

## 2017-03-06 DIAGNOSIS — D696 Thrombocytopenia, unspecified: Secondary | ICD-10-CM | POA: Diagnosis present

## 2017-03-06 DIAGNOSIS — I82491 Acute embolism and thrombosis of other specified deep vein of right lower extremity: Secondary | ICD-10-CM | POA: Diagnosis not present

## 2017-03-06 DIAGNOSIS — W010XXA Fall on same level from slipping, tripping and stumbling without subsequent striking against object, initial encounter: Secondary | ICD-10-CM | POA: Diagnosis present

## 2017-03-06 DIAGNOSIS — S52513A Displaced fracture of unspecified radial styloid process, initial encounter for closed fracture: Secondary | ICD-10-CM | POA: Diagnosis present

## 2017-03-06 DIAGNOSIS — E871 Hypo-osmolality and hyponatremia: Secondary | ICD-10-CM | POA: Diagnosis not present

## 2017-03-06 DIAGNOSIS — F419 Anxiety disorder, unspecified: Secondary | ICD-10-CM | POA: Diagnosis present

## 2017-03-06 DIAGNOSIS — Z09 Encounter for follow-up examination after completed treatment for conditions other than malignant neoplasm: Secondary | ICD-10-CM | POA: Diagnosis not present

## 2017-03-06 DIAGNOSIS — R600 Localized edema: Secondary | ICD-10-CM

## 2017-03-06 DIAGNOSIS — Z7982 Long term (current) use of aspirin: Secondary | ICD-10-CM | POA: Diagnosis not present

## 2017-03-06 DIAGNOSIS — Z7983 Long term (current) use of bisphosphonates: Secondary | ICD-10-CM | POA: Diagnosis not present

## 2017-03-06 DIAGNOSIS — N189 Chronic kidney disease, unspecified: Secondary | ICD-10-CM | POA: Diagnosis present

## 2017-03-06 DIAGNOSIS — S72141A Displaced intertrochanteric fracture of right femur, initial encounter for closed fracture: Principal | ICD-10-CM | POA: Diagnosis present

## 2017-03-06 DIAGNOSIS — F17210 Nicotine dependence, cigarettes, uncomplicated: Secondary | ICD-10-CM | POA: Diagnosis present

## 2017-03-06 DIAGNOSIS — R042 Hemoptysis: Secondary | ICD-10-CM | POA: Diagnosis not present

## 2017-03-06 DIAGNOSIS — S52514A Nondisplaced fracture of right radial styloid process, initial encounter for closed fracture: Secondary | ICD-10-CM | POA: Diagnosis present

## 2017-03-06 DIAGNOSIS — F319 Bipolar disorder, unspecified: Secondary | ICD-10-CM | POA: Diagnosis not present

## 2017-03-06 DIAGNOSIS — Z8659 Personal history of other mental and behavioral disorders: Secondary | ICD-10-CM | POA: Diagnosis not present

## 2017-03-06 DIAGNOSIS — E860 Dehydration: Secondary | ICD-10-CM | POA: Diagnosis present

## 2017-03-06 DIAGNOSIS — S72009A Fracture of unspecified part of neck of unspecified femur, initial encounter for closed fracture: Secondary | ICD-10-CM

## 2017-03-06 DIAGNOSIS — S72001A Fracture of unspecified part of neck of right femur, initial encounter for closed fracture: Secondary | ICD-10-CM | POA: Diagnosis not present

## 2017-03-06 DIAGNOSIS — Z8673 Personal history of transient ischemic attack (TIA), and cerebral infarction without residual deficits: Secondary | ICD-10-CM

## 2017-03-06 DIAGNOSIS — I82401 Acute embolism and thrombosis of unspecified deep veins of right lower extremity: Secondary | ICD-10-CM | POA: Diagnosis not present

## 2017-03-06 HISTORY — DX: Displaced fracture of unspecified radial styloid process, initial encounter for closed fracture: S52.513A

## 2017-03-06 LAB — CBC WITH DIFFERENTIAL/PLATELET
BASOS ABS: 0 10*3/uL (ref 0.0–0.1)
BASOS PCT: 0 %
Eosinophils Absolute: 0 10*3/uL (ref 0.0–0.7)
Eosinophils Relative: 0 %
HEMATOCRIT: 32.2 % — AB (ref 36.0–46.0)
Hemoglobin: 11 g/dL — ABNORMAL LOW (ref 12.0–15.0)
Lymphocytes Relative: 16 %
Lymphs Abs: 1.4 10*3/uL (ref 0.7–4.0)
MCH: 31.9 pg (ref 26.0–34.0)
MCHC: 34.2 g/dL (ref 30.0–36.0)
MCV: 93.3 fL (ref 78.0–100.0)
Monocytes Absolute: 0.9 10*3/uL (ref 0.1–1.0)
Monocytes Relative: 10 %
NEUTROS ABS: 6.4 10*3/uL (ref 1.7–7.7)
NEUTROS PCT: 74 %
Platelets: 161 10*3/uL (ref 150–400)
RBC: 3.45 MIL/uL — ABNORMAL LOW (ref 3.87–5.11)
RDW: 13.6 % (ref 11.5–15.5)
WBC: 8.7 10*3/uL (ref 4.0–10.5)

## 2017-03-06 LAB — URINALYSIS, ROUTINE W REFLEX MICROSCOPIC
BACTERIA UA: NONE SEEN
BILIRUBIN URINE: NEGATIVE
Glucose, UA: NEGATIVE mg/dL
Hgb urine dipstick: NEGATIVE
KETONES UR: 5 mg/dL — AB
Nitrite: NEGATIVE
PH: 5 (ref 5.0–8.0)
Protein, ur: NEGATIVE mg/dL
SQUAMOUS EPITHELIAL / LPF: NONE SEEN
Specific Gravity, Urine: 1.021 (ref 1.005–1.030)

## 2017-03-06 LAB — RETICULOCYTES
RBC.: 3.27 MIL/uL — ABNORMAL LOW (ref 3.87–5.11)
RETIC COUNT ABSOLUTE: 49.1 10*3/uL (ref 19.0–186.0)
Retic Ct Pct: 1.5 % (ref 0.4–3.1)

## 2017-03-06 LAB — CORTISOL: Cortisol, Plasma: 21.7 ug/dL

## 2017-03-06 LAB — TSH: TSH: 0.664 u[IU]/mL (ref 0.350–4.500)

## 2017-03-06 LAB — COMPREHENSIVE METABOLIC PANEL
ALBUMIN: 3.2 g/dL — AB (ref 3.5–5.0)
ALT: 12 U/L — ABNORMAL LOW (ref 14–54)
AST: 19 U/L (ref 15–41)
Alkaline Phosphatase: 62 U/L (ref 38–126)
Anion gap: 9 (ref 5–15)
BILIRUBIN TOTAL: 0.3 mg/dL (ref 0.3–1.2)
BUN: 16 mg/dL (ref 6–20)
CHLORIDE: 88 mmol/L — AB (ref 101–111)
CO2: 27 mmol/L (ref 22–32)
Calcium: 9.4 mg/dL (ref 8.9–10.3)
Creatinine, Ser: 0.56 mg/dL (ref 0.44–1.00)
GFR calc Af Amer: >60 mL/min (ref >60)
GFR calc non Af Amer: >60 mL/min (ref >60)
GLUCOSE: 118 mg/dL — AB (ref 65–99)
POTASSIUM: 4.2 mmol/L (ref 3.5–5.1)
Sodium: 124 mmol/L — ABNORMAL LOW (ref 135–145)
TOTAL PROTEIN: 6.1 g/dL — AB (ref 6.5–8.1)

## 2017-03-06 LAB — SODIUM, URINE, RANDOM: SODIUM UR: 24 mmol/L

## 2017-03-06 LAB — CREATININE, URINE, RANDOM: CREATININE, URINE: 145.4 mg/dL

## 2017-03-06 LAB — ABO/RH: ABO/RH(D): O POS

## 2017-03-06 LAB — SURGICAL PCR SCREEN
MRSA, PCR: NEGATIVE
STAPHYLOCOCCUS AUREUS: NEGATIVE

## 2017-03-06 LAB — PREPARE RBC (CROSSMATCH)

## 2017-03-06 MED ORDER — METHOCARBAMOL 1000 MG/10ML IJ SOLN
500.0000 mg | Freq: Four times a day (QID) | INTRAVENOUS | Status: DC | PRN
  Filled 2017-03-06: qty 5

## 2017-03-06 MED ORDER — IPRATROPIUM BROMIDE 0.02 % IN SOLN: 0.5000 mg | RESPIRATORY_TRACT | Status: DC | PRN

## 2017-03-06 MED ORDER — ACETAMINOPHEN 325 MG PO TABS
650.0000 mg | ORAL_TABLET | Freq: Four times a day (QID) | ORAL | Status: DC | PRN
  Administered 2017-03-07: 650 mg via ORAL
  Filled 2017-03-06: qty 2

## 2017-03-06 MED ORDER — DIVALPROEX SODIUM ER 500 MG PO TB24
1500.0000 mg | ORAL_TABLET | Freq: Every day | ORAL | Status: DC
  Administered 2017-03-06 – 2017-03-13 (×8): 1500 mg via ORAL
  Filled 2017-03-06 (×9): qty 3

## 2017-03-06 MED ORDER — RISPERIDONE 1 MG PO TABS
4.0000 mg | ORAL_TABLET | Freq: Every day | ORAL | Status: DC
  Administered 2017-03-06 – 2017-03-13 (×8): 4 mg via ORAL
  Filled 2017-03-06 (×9): qty 4

## 2017-03-06 MED ORDER — METHOCARBAMOL 500 MG PO TABS
500.0000 mg | ORAL_TABLET | Freq: Four times a day (QID) | ORAL | Status: DC | PRN
  Administered 2017-03-06 – 2017-03-14 (×2): 500 mg via ORAL
  Filled 2017-03-06 (×2): qty 1

## 2017-03-06 MED ORDER — METHIMAZOLE 5 MG PO TABS
10.0000 mg | ORAL_TABLET | Freq: Every day | ORAL | Status: DC
  Administered 2017-03-06 – 2017-03-14 (×9): 10 mg via ORAL
  Filled 2017-03-06 (×2): qty 1
  Filled 2017-03-06 (×2): qty 2
  Filled 2017-03-06 (×2): qty 1
  Filled 2017-03-06 (×2): qty 2
  Filled 2017-03-06 (×2): qty 1
  Filled 2017-03-06: qty 2
  Filled 2017-03-06: qty 1

## 2017-03-06 MED ORDER — SODIUM CHLORIDE 0.9 % IV SOLN
Freq: Once | INTRAVENOUS | Status: AC
  Administered 2017-03-07: via INTRAVENOUS

## 2017-03-06 MED ORDER — CLONAZEPAM 0.5 MG PO TABS
0.5000 mg | ORAL_TABLET | Freq: Every day | ORAL | Status: DC
  Administered 2017-03-06 – 2017-03-13 (×8): 0.5 mg via ORAL
  Filled 2017-03-06 (×8): qty 1

## 2017-03-06 MED ORDER — MORPHINE SULFATE (PF) 2 MG/ML IV SOLN
1.0000 mg | INTRAVENOUS | Status: DC | PRN
  Administered 2017-03-06 – 2017-03-07 (×2): 2 mg via INTRAVENOUS
  Filled 2017-03-06 (×2): qty 1

## 2017-03-06 MED ORDER — ONDANSETRON HCL 4 MG PO TABS: 4.0000 mg | ORAL_TABLET | Freq: Four times a day (QID) | ORAL | Status: DC | PRN

## 2017-03-06 MED ORDER — NICOTINE 14 MG/24HR TD PT24
14.0000 mg | MEDICATED_PATCH | Freq: Every day | TRANSDERMAL | Status: DC
  Administered 2017-03-06 – 2017-03-14 (×9): 14 mg via TRANSDERMAL
  Filled 2017-03-06 (×11): qty 1

## 2017-03-06 MED ORDER — ALBUTEROL SULFATE (2.5 MG/3ML) 0.083% IN NEBU: 2.5000 mg | INHALATION_SOLUTION | RESPIRATORY_TRACT | Status: DC | PRN

## 2017-03-06 MED ORDER — BENZTROPINE MESYLATE 1 MG PO TABS
1.0000 mg | ORAL_TABLET | Freq: Two times a day (BID) | ORAL | Status: DC
  Administered 2017-03-06 – 2017-03-14 (×16): 1 mg via ORAL
  Filled 2017-03-06 (×16): qty 1

## 2017-03-06 MED ORDER — MOMETASONE FURO-FORMOTEROL FUM 100-5 MCG/ACT IN AERO
2.0000 | INHALATION_SPRAY | Freq: Two times a day (BID) | RESPIRATORY_TRACT | Status: DC
  Administered 2017-03-06 – 2017-03-14 (×16): 2 via RESPIRATORY_TRACT
  Filled 2017-03-06 (×2): qty 8.8

## 2017-03-06 MED ORDER — FLEET ENEMA 7-19 GM/118ML RE ENEM: 1.0000 | ENEMA | Freq: Once | RECTAL | Status: DC | PRN

## 2017-03-06 MED ORDER — SENNOSIDES-DOCUSATE SODIUM 8.6-50 MG PO TABS
1.0000 | ORAL_TABLET | Freq: Every evening | ORAL | Status: DC | PRN
  Administered 2017-03-12: 1 via ORAL

## 2017-03-06 MED ORDER — SODIUM CHLORIDE 0.9% FLUSH
3.0000 mL | Freq: Two times a day (BID) | INTRAVENOUS | Status: DC
  Administered 2017-03-06 – 2017-03-13 (×14): 3 mL via INTRAVENOUS

## 2017-03-06 MED ORDER — ACETAMINOPHEN 650 MG RE SUPP: 650.0000 mg | Freq: Four times a day (QID) | RECTAL | Status: DC | PRN

## 2017-03-06 MED ORDER — TRAZODONE HCL 50 MG PO TABS
200.0000 mg | ORAL_TABLET | Freq: Every day | ORAL | Status: DC
  Administered 2017-03-06 – 2017-03-13 (×8): 200 mg via ORAL
  Filled 2017-03-06 (×9): qty 4

## 2017-03-06 MED ORDER — HYDROMORPHONE HCL 1 MG/ML IJ SOLN
1.0000 mg | Freq: Once | INTRAMUSCULAR | Status: AC
  Administered 2017-03-06: 1 mg via INTRAVENOUS
  Filled 2017-03-06: qty 1

## 2017-03-06 MED ORDER — VITAMIN B-12 1000 MCG PO TABS
1000.0000 ug | ORAL_TABLET | Freq: Every day | ORAL | Status: DC
  Administered 2017-03-06 – 2017-03-14 (×9): 1000 ug via ORAL
  Filled 2017-03-06 (×9): qty 1

## 2017-03-06 MED ORDER — TIOTROPIUM BROMIDE MONOHYDRATE 18 MCG IN CAPS
18.0000 ug | ORAL_CAPSULE | Freq: Every day | RESPIRATORY_TRACT | Status: DC
  Administered 2017-03-07 – 2017-03-14 (×7): 18 ug via RESPIRATORY_TRACT
  Filled 2017-03-06 (×2): qty 5

## 2017-03-06 MED ORDER — ONDANSETRON HCL 4 MG/2ML IJ SOLN
4.0000 mg | Freq: Four times a day (QID) | INTRAMUSCULAR | Status: DC | PRN
Start: 2017-03-06 — End: 2017-03-14

## 2017-03-06 MED ORDER — ASPIRIN EC 81 MG PO TBEC
81.0000 mg | DELAYED_RELEASE_TABLET | Freq: Every day | ORAL | Status: DC
  Administered 2017-03-06 – 2017-03-07 (×2): 81 mg via ORAL
  Filled 2017-03-06 (×2): qty 1

## 2017-03-06 MED ORDER — KETOROLAC TROMETHAMINE 15 MG/ML IJ SOLN
15.0000 mg | Freq: Four times a day (QID) | INTRAMUSCULAR | Status: DC | PRN
  Administered 2017-03-06: 15 mg via INTRAVENOUS
  Filled 2017-03-06 (×2): qty 1

## 2017-03-06 MED ORDER — SODIUM CHLORIDE 0.9 % IV BOLUS (SEPSIS)
500.0000 mL | Freq: Once | INTRAVENOUS | Status: AC
  Administered 2017-03-06: 500 mL via INTRAVENOUS

## 2017-03-06 MED ORDER — OXYCODONE HCL 5 MG PO TABS
5.0000 mg | ORAL_TABLET | ORAL | Status: DC | PRN
  Administered 2017-03-06: 10 mg via ORAL
  Filled 2017-03-06: qty 2

## 2017-03-06 MED ORDER — SORBITOL 70 % SOLN: 30.0000 mL | Freq: Every day | Status: DC | PRN

## 2017-03-06 MED ORDER — SODIUM CHLORIDE 0.9 % IV SOLN
INTRAVENOUS | Status: DC
Start: 2017-03-06 — End: 2017-03-09
  Administered 2017-03-06 – 2017-03-09 (×5): via INTRAVENOUS

## 2017-03-06 NOTE — Progress Notes (Signed)
Trapeze for bed was not applied due to broken wrist and patient inability to pull on bar.

## 2017-03-06 NOTE — ED Notes (Signed)
Patient transported to X-ray 

## 2017-03-06 NOTE — ED Notes (Signed)
sats 88%.  Placed on 2 liters [place]

## 2017-03-06 NOTE — ED Provider Notes (Signed)
[facility] EMERGENCY DEPARTMENT Provider Note   CSN: [id number] Arrival date & time: 03/06/17  1145     History   Chief Complaint Chief Complaint  Patient presents with  . Fall    HPI Cassidy Smith is a 62 y.o. female.  Patient tripped and fell outside today.  She states she hurt her right upper leg and her right wrist.  She did not hit her head   The history is provided by the patient.  Fall  This is a new problem. The current episode started 3 to 5 hours ago. The problem occurs rarely. The problem has been resolved. Pertinent negatives include no chest pain, no abdominal pain and no headaches. Exacerbated by: Movement. Nothing relieves the symptoms.    Past Medical History:  Diagnosis Date  . Anxiety   . Bipolar 1 disorder ([place])   . Chronic hip pain   . Chronic kidney disease    uti  currently,  hx bladder spasms  . Depression   . Headache(784.0)   . Neuromuscular disorder (HCC)    shaking of hands   . Stroke [facility])     Patient Active Problem List   Diagnosis Date Noted  . Hip fracture ([place]) 03/06/2017  . Orthostatic hypotension 06/07/2015  . UTI (urinary tract infection) 06/07/2015  . Rhabdomyolysis 06/07/2015  . White matter abnormality on MRI of brain 05/14/2013  . History of depression 05/14/2013  . Hx of anxiety disorder 05/14/2013  . Bipolar I disorder, most recent episode (or current) manic, unspecified 05/14/2013  . Bipolar 1 disorder ([place]) 05/14/2013  . Hyponatremia 05/12/2013  . Bipolar disorder ([place]) 05/12/2013  . Lacunar infarct, acute 05/12/2013    Past Surgical History:  Procedure Laterality Date  . MULTIPLE EXTRACTIONS WITH ALVEOLOPLASTY N/A 10/30/2012   Procedure: MULTIPLE EXTRACION 5, 6, 8, 9, 10 ,18, 19, 31 WITH MAXILLARY RIGHT AND LEFT  ALVEOLOPLASTY REDUCE MAXILLARY LEFT TUBEROSITY;  Surgeon: Gae Bon, DDS;  Location: [place];  Service: Oral Surgery;  Laterality: N/A;    OB History    No data available       Home  Medications    Prior to Admission medications   Medication Sig Start Date End Date Taking? Authorizing Provider  alendronate (FOSAMAX) 70 MG tablet Take 1 tablet by mouth 2 (two) times a week. 01/18/16   [provider]  aspirin EC 81 MG EC tablet Take 1 tablet (81 mg total) by mouth daily. 05/15/13   Samuella Cota, MD  benztropine (COGENTIN) 1 MG tablet Take 1 mg by mouth 2 (two) times daily.    [provider]  clonazePAM (KLONOPIN) 0.5 MG tablet Take 0.5 mg by mouth at bedtime.    [provider]  divalproex (DEPAKOTE ER) 500 MG 24 hr tablet Take 1,500 mg by mouth at bedtime.     [provider]  gabapentin (NEURONTIN) 300 MG capsule Take 300 mg by mouth 3 (three) times daily. 07/04/15   [provider]  hydrOXYzine (VISTARIL) 25 MG capsule Take 1 capsule by mouth every 8 (eight) hours as needed for itching. 01/24/16   [provider]  methimazole (TAPAZOLE) 10 MG tablet Take 10 mg by mouth daily.  12/24/15   [provider]  midodrine (PROAMATINE) 10 MG tablet Take 1 tablet (10 mg total) by mouth 3 (three) times daily with meals. 06/11/15   Lavina Hamman, MD  Multiple Vitamins-Minerals (CENTRUM SILVER ADULT 50+ PO) Take 1 tablet by mouth every morning.  [provider]  naproxen (NAPROSYN) 500 MG tablet Take 500 mg by mouth 2 (two) times daily as needed for mild pain or moderate pain.    [provider]  nicotine (NICODERM CQ - DOSED IN MG/24 HOURS) 21 mg/24hr patch Place 1 patch (21 mg total) onto the skin daily. 06/11/15   Lavina Hamman, MD  polyethylene glycol [facility] Floria Raveling) packet Take 17 g by mouth daily. 06/11/15   Lavina Hamman, MD  predniSONE (DELTASONE) 20 MG tablet take 1 tablet twice a day for 5 days 01/24/16   [provider]  RISPERDAL CONSTA 50 MG injection Inject 50 mg into the muscle every 14 (fourteen) days. 10/24/14   [provider]  risperidone (RISPERDAL) 4 MG tablet  Take 4 mg by mouth at bedtime. 06/22/15   [provider]  traMADol (ULTRAM) 50 MG tablet Take 1-2 tablets by mouth every 6 (six) hours.  07/04/15   [provider]  traZODone (DESYREL) 100 MG tablet Take 200 mg by mouth at bedtime.    [provider]  vitamin B-12 (CYANOCOBALAMIN) 1000 MCG tablet Take 1,000 mcg by mouth daily.    [provider]    Family History Family History  Problem Relation Age of Onset  . Breast cancer Mother   . Alcoholism Father     Social History Social History   Tobacco Use  . Smoking status: Current Every Day Smoker    Packs/day: 0.50    Years: 20.00    Pack years: 10.00    Types: Cigarettes  . Smokeless tobacco: Never Used  Substance Use Topics  . Alcohol use: No  . Drug use: No     Allergies   Patient has no known allergies.   Review of Systems Review of Systems  Constitutional: Negative for appetite change and fatigue.  HENT: Negative for congestion, ear discharge and sinus pressure.   Eyes: Negative for discharge.  Respiratory: Negative for cough.   Cardiovascular: Negative for chest pain.  Gastrointestinal: Negative for abdominal pain and diarrhea.  Genitourinary: Negative for frequency and hematuria.  Musculoskeletal: Negative for back pain.       Right hip pain minimal right wrist pain  Skin: Negative for rash.  Neurological: Negative for seizures and headaches.  Psychiatric/Behavioral: Negative for hallucinations.     Physical Exam Updated Vital Signs BP 115/62   Pulse 70   Temp 97.7 F (36.5 C) (Oral)   Resp 18   SpO2 (!) 88%   Physical Exam  Constitutional: She is oriented to person, place, and time. She appears well-developed.  HENT:  Head: Normocephalic.  Eyes: Conjunctivae and EOM are normal. No scleral icterus.  Neck: Neck supple. No thyromegaly present.  Cardiovascular: Normal rate and regular rhythm. Exam reveals no gallop and no friction rub.  No murmur  heard. Pulmonary/Chest: No stridor. She has no wheezes. She has no rales. She exhibits no tenderness.  Abdominal: She exhibits no distension. There is no tenderness. There is no rebound.  Musculoskeletal: She exhibits no edema.  Tenderness to right hip with external rotation of right leg.  Minimal tenderness to right wrist neurovascular exam normal  Lymphadenopathy:    She has no cervical adenopathy.  Neurological: She is oriented to person, place, and time. She exhibits normal muscle tone. Coordination normal.  Skin: No rash noted. No erythema.  Psychiatric: She has a normal mood and affect. Her behavior is normal.     ED Treatments / Results  Labs (all labs  ordered are listed, but only abnormal results are displayed) Labs Reviewed  CBC WITH DIFFERENTIAL/PLATELET - Abnormal; Notable for the following components:      Result Value   RBC 3.45 (*)    Hemoglobin 11.0 (*)    HCT 32.2 (*)    All other components within normal limits  COMPREHENSIVE METABOLIC PANEL - Abnormal; Notable for the following components:   Sodium 124 (*)    Chloride 88 (*)    Glucose, Bld 118 (*)    Total Protein 6.1 (*)    Albumin 3.2 (*)    ALT 12 (*)    All other components within normal limits  URINE CULTURE  URINALYSIS, ROUTINE W REFLEX MICROSCOPIC  SODIUM, URINE, RANDOM  CREATININE, URINE, RANDOM  TSH  CORTISOL    EKG  EKG Interpretation None       Radiology Dg Chest 1 View  Result Date: 03/06/2017 CLINICAL DATA:  Fall today with right hip fracture. EXAM: CHEST 1 VIEW COMPARISON:  01/26/2016 FINDINGS: The cardiomediastinal silhouette is within normal limits. The lungs remain hyperinflated. There is minimal scarring in the left lung base. No confluent airspace opacity, edema, pleural effusion, or pneumothorax is identified. No acute osseous abnormality is seen. IMPRESSION: No active disease. Electronically Signed   By: Logan Bores M.D.   On: 03/06/2017 13:15   Dg Wrist Complete  Right  Result Date: 03/06/2017 CLINICAL DATA:  Right wrist pain due to a fall today. Initial encounter. EXAM: RIGHT WRIST - COMPLETE 3+ VIEW COMPARISON:  None. FINDINGS: Subtle cortical irregularity radius at the styloid extending to the articular surface is seen in the distal consistent with a nondisplaced fracture. No other acute bony or joint abnormality is identified. IMPRESSION: Nondisplaced radial styloid fracture. Electronically Signed   By: Inge Rise M.D.   On: 03/06/2017 13:11   Dg Hip Unilat W Or Wo Pelvis 2-3 Views Right  Result Date: 03/06/2017 CLINICAL DATA:  Fall.  Right hip pain and deformity. EXAM: DG HIP (WITH OR WITHOUT PELVIS) 2-3V RIGHT COMPARISON:  11/24/2015. FINDINGS: Diffuse osteopenia degenerative change. Right intertrochanteric hip fracture with angulation deformity noted. Fractures fragments are displaced. IMPRESSION: Displaced angulated right intertrochanteric hip fracture. Electronically Signed   By: Marcello Moores  Register   On: 03/06/2017 13:09    Procedures Procedures (including critical care time)  Medications Ordered in ED Medications  morphine 2 MG/ML injection 1-2 mg (not administered)  0.9 %  sodium chloride infusion (not administered)  sodium chloride 0.9 % bolus 500 mL (not administered)  HYDROmorphone (DILAUDID) injection 1 mg (1 mg Intravenous Given 03/06/17 1329)     Initial Impression / Assessment and Plan / ED Course  I have reviewed the triage vital signs and the nursing notes.  Pertinent labs & imaging results that were available during my care of the patient were reviewed by me and considered in my medical decision making (see chart for details).     Patient has a fractured right hip.  I spoke with orthopedic surgeon Dr. Aline Brochure and he states he will most likely do surgery tomorrow.  We will get medicine to admit the patient also has a small fracture to her wrist that she is placed on a splint  Final Clinical Impressions(s) / ED Diagnoses    Final diagnoses:  Fall, initial encounter    ED Discharge Orders    None       Milton Ferguson, MD 03/06/17 1404

## 2017-03-06 NOTE — ED Triage Notes (Signed)
Fall this morning.  Pt states she got tripped up over her foot.  C/o pain to right upper  leg  and right wrist

## 2017-03-06 NOTE — Progress Notes (Signed)
Patient has arrived on [address], Room 325 and Edrick Oh, RN has assumed the patient's care.

## 2017-03-06 NOTE — H&P (Signed)
History and Physical    Cassidy Smith HQI:[id number] DOB: [birth year] DOA: 03/06/2017  PCP: [facility] Patient coming from: Home  I have personally briefly reviewed patient's old medical records in [place]  Chief Complaint: I fell  HPI: Cassidy Smith is a 62 y.o. female with medical history significant of bipolar disorder, anxiety, headache, neuromuscular disorder who presents to the ED after a fall.  Patient and cousin who is at bedside provided most of the history.  Per patient and cousin at bedside patient states she was walking on the concrete when she tripped over her foot and fell on her right side.  Patient denies hitting the head.  Patient denies any loss of consciousness.  Patient was on the ground and her constant happened to be walking past when patient called out her name and she came and found patient on the concrete floor and arrange.  Because he was able to get patient up and take her into the house with patient's brother was called.  Patient insisted on eating her breakfast and drinking some coffee prior to EMS being called.  EMS was called and patient was subsequently brought to the ED.  Patient does endorse some dizziness on getting up.  Patient denies any fever, no chills, no chest pain, no shortness of breath, no dysuria, no melena, no hematemesis, no hematochezia, no cough.  Patient does endorse ongoing chronic soft and loose stools have been ongoing for year which patient uses Imodium periodically.  Patient states has about 2 bowel movements per day.  Patient denies any constipation.  ED Course: Patient seen in the ED comprehensive metabolic profile done showed a sodium of 124, chloride of 88, glucose of 118, albumin of 3.2 otherwise was within normal limits.  CBC had a hemoglobin of 11.0. TSH 0.664. Urinalysis with trace leukocytes, negative negative. CXR with out any infiltrate.  Plain films of the right hip and pelvis with displaced angulated  right intertrochanteric hip fracture.  Films of the right wrist show nondisplaced radial styloid fracture.  Patient placed in a splint in the ED.  Review of Systems: As per HPI otherwise 10 point review of systems negative.   Past Medical History:  Diagnosis Date  . Anxiety   . Bipolar 1 disorder ([place])   . Chronic hip pain   . Chronic kidney disease    uti  currently,  hx bladder spasms  . Depression   . Headache(784.0)   . Neuromuscular disorder (HCC)    shaking of hands   . Stroke [facility])     Past Surgical History:  Procedure Laterality Date  . MULTIPLE EXTRACTIONS WITH ALVEOLOPLASTY N/A 10/30/2012   Procedure: MULTIPLE EXTRACION 5, 6, 8, 9, 10 ,18, 19, 31 WITH MAXILLARY RIGHT AND LEFT  ALVEOLOPLASTY REDUCE MAXILLARY LEFT TUBEROSITY;  Surgeon: Gae Bon, DDS;  Location: [place];  Service: Oral Surgery;  Laterality: N/A;     reports that she has been smoking cigarettes.  She has a 10.00 pack-year smoking history. she has never used smokeless tobacco. She reports that she does not drink alcohol or use drugs.  No Known Allergies  Family History  Problem Relation Age of Onset  . Breast cancer Mother   . Cancer - Other Mother   . Alcoholism Father    Mother deceased age 9 from breast cancer.  Father deceased age 68 lyme disease.  Prior to Admission medications   Medication Sig Start Date End Date Taking? Authorizing Provider  alendronate (  FOSAMAX) 70 MG tablet Take 1 tablet by mouth 2 (two) times a week. 01/18/16   [provider]  aspirin EC 81 MG EC tablet Take 1 tablet (81 mg total) by mouth daily. 05/15/13   Samuella Cota, MD  benztropine (COGENTIN) 1 MG tablet Take 1 mg by mouth 2 (two) times daily.    [provider]  clonazePAM (KLONOPIN) 0.5 MG tablet Take 0.5 mg by mouth at bedtime.    [provider]  divalproex (DEPAKOTE ER) 500 MG 24 hr tablet Take 1,500 mg by mouth at bedtime.     [provider]  gabapentin (NEURONTIN) 300  MG capsule Take 300 mg by mouth 3 (three) times daily. 07/04/15   [provider]  hydrOXYzine (VISTARIL) 25 MG capsule Take 1 capsule by mouth every 8 (eight) hours as needed for itching. 01/24/16   [provider]  methimazole (TAPAZOLE) 10 MG tablet Take 10 mg by mouth daily.  12/24/15   [provider]  midodrine (PROAMATINE) 10 MG tablet Take 1 tablet (10 mg total) by mouth 3 (three) times daily with meals. 06/11/15   Lavina Hamman, MD  Multiple Vitamins-Minerals (CENTRUM SILVER ADULT 50+ PO) Take 1 tablet by mouth every morning.    [provider]  naproxen (NAPROSYN) 500 MG tablet Take 500 mg by mouth 2 (two) times daily as needed for mild pain or moderate pain.    [provider]  nicotine (NICODERM CQ - DOSED IN MG/24 HOURS) 21 mg/24hr patch Place 1 patch (21 mg total) onto the skin daily. 06/11/15   Lavina Hamman, MD  polyethylene glycol [facility] Floria Raveling) packet Take 17 g by mouth daily. 06/11/15   Lavina Hamman, MD  predniSONE (DELTASONE) 20 MG tablet take 1 tablet twice a day for 5 days 01/24/16   [provider]  RISPERDAL CONSTA 50 MG injection Inject 50 mg into the muscle every 14 (fourteen) days. 10/24/14   [provider]  risperidone (RISPERDAL) 4 MG tablet Take 4 mg by mouth at bedtime. 06/22/15   [provider]  traMADol (ULTRAM) 50 MG tablet Take 1-2 tablets by mouth every 6 (six) hours.  07/04/15   [provider]  traZODone (DESYREL) 100 MG tablet Take 200 mg by mouth at bedtime.    [provider]  vitamin B-12 (CYANOCOBALAMIN) 1000 MCG tablet Take 1,000 mcg by mouth daily.    [provider]    Physical Exam: Vitals:   03/06/17 1330 03/06/17 1332 03/06/17 1400 03/06/17 1452  BP: 99/72  105/65 138/74  Pulse: 62 70 72 67  Resp:    18  Temp:      TempSrc:      SpO2: 98% (!) 88% 100% 100%    Constitutional: NAD, calm, comfortable Vitals:   03/06/17 1330 03/06/17 1332  03/06/17 1400 03/06/17 1452  BP: 99/72  105/65 138/74  Pulse: 62 70 72 67  Resp:    18  Temp:      TempSrc:      SpO2: 98% (!) 88% 100% 100%   Eyes: PERRL, lids and conjunctivae normal ENMT: Mucous membranes are dry. Posterior pharynx clear of any exudate or lesions.Normal dentition.  Neck: normal, supple, no masses, no thyromegaly Respiratory: clear to auscultation bilaterally, no wheezing, no crackles. Normal respiratory effort. No accessory muscle use.  Cardiovascular: Regular rate and rhythm, no murmurs / rubs / gallops. No extremity edema. 2+ pedal pulses. No carotid bruits.  Abdomen: no tenderness,  no masses palpated. No hepatosplenomegaly. Bowel sounds positive.  Musculoskeletal: no clubbing / cyanosis.  Right lower extremity externally rotated and angulated.  Pulses intact.  Right wrist in a splint. Normal muscle tone.  Skin: no rashes, lesions, ulcers. No induration Neurologic: CN 2-12 grossly intact. Sensation intact, DTR normal. Strength 5/5 in BUE and LLE.  Psychiatric: Normal judgment and insight. Alert and oriented x 3. Normal mood.   Labs on Admission: I have personally reviewed following labs and imaging studies  CBC: Recent Labs  Lab 03/06/17 1228  WBC 8.7  NEUTROABS 6.4  HGB 11.0*  HCT 32.2*  MCV 93.3  PLT 008   Basic Metabolic Panel: Recent Labs  Lab 03/06/17 1228  NA 124*  K 4.2  CL 88*  CO2 27  GLUCOSE 118*  BUN 16  CREATININE 0.56  CALCIUM 9.4   GFR: CrCl cannot be calculated (Unknown ideal weight.). Liver Function Tests: Recent Labs  Lab 03/06/17 1228  AST 19  ALT 12*  ALKPHOS 62  BILITOT 0.3  PROT 6.1*  ALBUMIN 3.2*   No results for input(s): LIPASE, AMYLASE in the last 168 hours. No results for input(s): AMMONIA in the last 168 hours. Coagulation Profile: No results for input(s): INR, PROTIME in the last 168 hours. Cardiac Enzymes: No results for input(s): CKTOTAL, CKMB, CKMBINDEX, TROPONINI in the last 168 hours. BNP (last 3  results) No results for input(s): PROBNP in the last 8760 hours. HbA1C: No results for input(s): HGBA1C in the last 72 hours. CBG: No results for input(s): GLUCAP in the last 168 hours. Lipid Profile: No results for input(s): CHOL, HDL, LDLCALC, TRIG, CHOLHDL, LDLDIRECT in the last 72 hours. Thyroid Function Tests: No results for input(s): TSH, T4TOTAL, FREET4, T3FREE, THYROIDAB in the last 72 hours. Anemia Panel: No results for input(s): VITAMINB12, FOLATE, FERRITIN, TIBC, IRON, RETICCTPCT in the last 72 hours. Urine analysis:    Component Value Date/Time   COLORURINE YELLOW 11/30/2015 0315   APPEARANCEUR CLEAR 11/30/2015 0315   LABSPEC <1.005 (L) 11/30/2015 0315   PHURINE 7.5 11/30/2015 0315   GLUCOSEU NEGATIVE 11/30/2015 0315   HGBUR NEGATIVE 11/30/2015 0315   BILIRUBINUR NEGATIVE 11/30/2015 0315   KETONESUR NEGATIVE 11/30/2015 0315   PROTEINUR NEGATIVE 11/30/2015 0315   UROBILINOGEN 0.2 05/12/2013 0210   NITRITE NEGATIVE 11/30/2015 0315   LEUKOCYTESUR NEGATIVE 11/30/2015 0315    Radiological Exams on Admission: Dg Chest 1 View  Result Date: 03/06/2017 CLINICAL DATA:  Fall today with right hip fracture. EXAM: CHEST 1 VIEW COMPARISON:  01/26/2016 FINDINGS: The cardiomediastinal silhouette is within normal limits. The lungs remain hyperinflated. There is minimal scarring in the left lung base. No confluent airspace opacity, edema, pleural effusion, or pneumothorax is identified. No acute osseous abnormality is seen. IMPRESSION: No active disease. Electronically Signed   By: Logan Bores M.D.   On: 03/06/2017 13:15   Dg Wrist Complete Right  Result Date: 03/06/2017 CLINICAL DATA:  Right wrist pain due to a fall today. Initial encounter. EXAM: RIGHT WRIST - COMPLETE 3+ VIEW COMPARISON:  None. FINDINGS: Subtle cortical irregularity radius at the styloid extending to the articular surface is seen in the distal consistent with a nondisplaced fracture. No other acute bony or joint  abnormality is identified. IMPRESSION: Nondisplaced radial styloid fracture. Electronically Signed   By: Inge Rise M.D.   On: 03/06/2017 13:11   Dg Hip Unilat W Or Wo Pelvis 2-3 Views Right  Result Date: 03/06/2017 CLINICAL DATA:  Fall.  Right hip pain and deformity.  EXAM: DG HIP (WITH OR WITHOUT PELVIS) 2-3V RIGHT COMPARISON:  11/24/2015. FINDINGS: Diffuse osteopenia degenerative change. Right intertrochanteric hip fracture with angulation deformity noted. Fractures fragments are displaced. IMPRESSION: Displaced angulated right intertrochanteric hip fracture. Electronically Signed   By: Marcello Moores  Register   On: 03/06/2017 13:09    EKG: Pending  Assessment/Plan Principal Problem:   Closed intertrochanteric fracture of hip, right, initial encounter [facility]) Active Problems:   Hyponatremia   Bipolar disorder ([place])   History of depression   Hx of anxiety disorder   Radial styloid fracture: right   Anemia   #1 closed intertrochanteric fracture of the right hip Secondary to mechanical fall.  Patient denies any syncopal episodes.  Plain films of the right hip and pelvis showing a displaced angulated right intertrochanteric hip fracture.  ED physician states he spoke with Dr. Aline Brochure, orthopedics who had recommended hospitalist admission and he will evaluate for probable surgery tomorrow 03/07/2017.  Will place on bedrest.  Will place on the hip fracture protocol pathway.  Pain management.  We will keep patient n.p.o. after midnight pending orthopedic evaluation for possible surgical repair tomorrow.  Per orthopedics.  2.  Hyponatremia Likely secondary to hypovolemic hyponatremia.  Patient looks clinically dehydrated on examination.  Patient also noted to be on antipsychotic medications which could be the etiology of her hyponatremia.  Check a fractional excretion of sodium.  Check a TSH.  Check a cortisol level.  Chest x-ray unremarkable.  Placed on IV fluids.  Follow.  3.  Bipolar  disorder/anxiety/depression Currently stable.  Resume home regimen of Cogentin, Klonopin, Depakote, Risperdal.  4.  ?? Hypothyroidism  Continue home regimen Tapazole.  Check a TSH.  5 right radial styloid fracture Patient placed in the splints.  Per orthopedics.  6.  Anemia Patient with no overt bleeding.  Check an anemia panel.  Follow H&H.  7.  History of tobacco abuse Tobacco cessation.  Placed on Spiriva and Dulera.  Nebs as needed.  Nicotine patch.  DVT prophylaxis: SCDs preop.  Postop per orthopedics. Code Status: Full Family Communication: Updated patient and family at bedside. Disposition Plan: To be determined.  Likely skilled nursing facility. Consults called: Orthopedics pending. Admission status: Admit to inpatient.   Irine Seal MD [facility] Pager [phone]  If 7PM-7AM, please contact night-coverage [email or web address] Password [facility]  03/06/2017, 3:06 PM

## 2017-03-07 ENCOUNTER — Inpatient Hospital Stay (HOSPITAL_COMMUNITY): Payer: Medicaid Other | Admitting: Anesthesiology

## 2017-03-07 ENCOUNTER — Inpatient Hospital Stay (HOSPITAL_COMMUNITY): Payer: Medicaid Other

## 2017-03-07 ENCOUNTER — Encounter (HOSPITAL_COMMUNITY)
Admission: EM | Disposition: A | Discharge status: Skilled Nursing Facility | Payer: Self-pay | Source: Home / Self Care | Attending: Internal Medicine

## 2017-03-07 ENCOUNTER — Encounter (HOSPITAL_COMMUNITY): Payer: Self-pay

## 2017-03-07 DIAGNOSIS — W19XXXA Unspecified fall, initial encounter: Secondary | ICD-10-CM

## 2017-03-07 HISTORY — PX: INTRAMEDULLARY (IM) NAIL INTERTROCHANTERIC: SHX5875

## 2017-03-07 LAB — COMPREHENSIVE METABOLIC PANEL
ALK PHOS: 51 U/L (ref 38–126)
ALT: 11 U/L — ABNORMAL LOW (ref 14–54)
ANION GAP: 7 (ref 5–15)
AST: 17 U/L (ref 15–41)
Albumin: 2.7 g/dL — ABNORMAL LOW (ref 3.5–5.0)
BILIRUBIN TOTAL: 0.5 mg/dL (ref 0.3–1.2)
BUN: 12 mg/dL (ref 6–20)
CO2: 26 mmol/L (ref 22–32)
Calcium: 8.7 mg/dL — ABNORMAL LOW (ref 8.9–10.3)
Chloride: 92 mmol/L — ABNORMAL LOW (ref 101–111)
Creatinine, Ser: 0.52 mg/dL (ref 0.44–1.00)
GFR calc Af Amer: >60 mL/min (ref >60)
GFR calc non Af Amer: >60 mL/min (ref >60)
Glucose, Bld: 94 mg/dL (ref 65–99)
POTASSIUM: 4 mmol/L (ref 3.5–5.1)
SODIUM: 125 mmol/L — AB (ref 135–145)
Total Protein: 5.2 g/dL — ABNORMAL LOW (ref 6.5–8.1)

## 2017-03-07 LAB — CBC
HEMATOCRIT: 24.3 % — AB (ref 36.0–46.0)
HEMOGLOBIN: 8.2 g/dL — AB (ref 12.0–15.0)
MCH: 31.3 pg (ref 26.0–34.0)
MCHC: 33.7 g/dL (ref 30.0–36.0)
MCV: 92.7 fL (ref 78.0–100.0)
Platelets: 121 10*3/uL — ABNORMAL LOW (ref 150–400)
RBC: 2.62 MIL/uL — ABNORMAL LOW (ref 3.87–5.11)
RDW: 13.4 % (ref 11.5–15.5)
WBC: 5.4 10*3/uL (ref 4.0–10.5)

## 2017-03-07 LAB — HIV ANTIBODY (ROUTINE TESTING W REFLEX): HIV Screen 4th Generation wRfx: NONREACTIVE

## 2017-03-07 LAB — VITAMIN D 25 HYDROXY (VIT D DEFICIENCY, FRACTURES): VIT D 25 HYDROXY: 49.2 ng/mL (ref 30.0–100.0)

## 2017-03-07 LAB — IRON AND TIBC
Iron: 44 ug/dL (ref 28–170)
Saturation Ratios: 16 % (ref 10.4–31.8)
TIBC: 283 ug/dL (ref 250–450)
UIBC: 239 ug/dL

## 2017-03-07 LAB — PREPARE RBC (CROSSMATCH)

## 2017-03-07 LAB — MAGNESIUM: Magnesium: 1.1 mg/dL — ABNORMAL LOW (ref 1.7–2.4)

## 2017-03-07 LAB — PROTIME-INR
INR: 1.01
Prothrombin Time: 13.2 seconds (ref 11.4–15.2)

## 2017-03-07 LAB — FERRITIN: Ferritin: 102 ng/mL (ref 11–307)

## 2017-03-07 LAB — FOLATE: Folate: 23 ng/mL (ref >5.9)

## 2017-03-07 LAB — VITAMIN B12: VITAMIN B 12: 2438 pg/mL — AB (ref 180–914)

## 2017-03-07 SURGERY — FIXATION, FRACTURE, INTERTROCHANTERIC, WITH INTRAMEDULLARY ROD
Anesthesia: Spinal | Site: Hip | Laterality: Right

## 2017-03-07 MED ORDER — PHENOL 1.4 % MT LIQD: 1.0000 | OROMUCOSAL | Status: DC | PRN

## 2017-03-07 MED ORDER — ENSURE ENLIVE PO LIQD
237.0000 mL | Freq: Two times a day (BID) | ORAL | Status: DC
  Administered 2017-03-08 – 2017-03-11 (×8): 237 mL via ORAL
  Filled 2017-03-07: qty 237

## 2017-03-07 MED ORDER — METOCLOPRAMIDE HCL 5 MG/ML IJ SOLN: 5.0000 mg | Freq: Three times a day (TID) | INTRAMUSCULAR | Status: DC | PRN

## 2017-03-07 MED ORDER — ACETAMINOPHEN 10 MG/ML IV SOLN
1000.0000 mg | Freq: Four times a day (QID) | INTRAVENOUS | Status: AC
  Administered 2017-03-07 – 2017-03-08 (×3): 1000 mg via INTRAVENOUS
  Filled 2017-03-07 (×3): qty 100

## 2017-03-07 MED ORDER — ACETAMINOPHEN 10 MG/ML IV SOLN: 1000.0000 mg | Freq: Once | INTRAVENOUS | Status: AC

## 2017-03-07 MED ORDER — BUPIVACAINE IN DEXTROSE 0.75-8.25 % IT SOLN
INTRATHECAL | Status: AC
  Filled 2017-03-07: qty 2

## 2017-03-07 MED ORDER — MAGNESIUM SULFATE 4 GM/100ML IV SOLN
4.0000 g | Freq: Once | INTRAVENOUS | Status: DC
  Filled 2017-03-07: qty 100

## 2017-03-07 MED ORDER — FUROSEMIDE 10 MG/ML IJ SOLN: 20.0000 mg | Freq: Once | INTRAMUSCULAR | Status: DC

## 2017-03-07 MED ORDER — ONDANSETRON HCL 4 MG/2ML IJ SOLN: 4.0000 mg | Freq: Four times a day (QID) | INTRAMUSCULAR | Status: DC | PRN

## 2017-03-07 MED ORDER — MIDAZOLAM HCL 5 MG/5ML IJ SOLN
INTRAMUSCULAR | Status: DC | PRN
  Administered 2017-03-07: 1 mg via INTRAVENOUS

## 2017-03-07 MED ORDER — CEFAZOLIN SODIUM-DEXTROSE 2-4 GM/100ML-% IV SOLN
2.0000 g | INTRAVENOUS | Status: AC
  Administered 2017-03-07: 2 g via INTRAVENOUS

## 2017-03-07 MED ORDER — LACTATED RINGERS IV SOLN
INTRAVENOUS | Status: DC
  Administered 2017-03-07 (×2): via INTRAVENOUS

## 2017-03-07 MED ORDER — BUPIVACAINE-EPINEPHRINE (PF) 0.5% -1:200000 IJ SOLN
INTRAMUSCULAR | Status: AC
  Filled 2017-03-07: qty 60

## 2017-03-07 MED ORDER — METOCLOPRAMIDE HCL 10 MG PO TABS: 5.0000 mg | ORAL_TABLET | Freq: Three times a day (TID) | ORAL | Status: DC | PRN

## 2017-03-07 MED ORDER — MENTHOL 3 MG MT LOZG: 1.0000 | LOZENGE | OROMUCOSAL | Status: DC | PRN

## 2017-03-07 MED ORDER — ONDANSETRON HCL 4 MG PO TABS: 4.0000 mg | ORAL_TABLET | Freq: Four times a day (QID) | ORAL | Status: DC | PRN

## 2017-03-07 MED ORDER — PROPOFOL 500 MG/50ML IV EMUL
INTRAVENOUS | Status: DC | PRN
  Administered 2017-03-07: 50 ug/kg/min via INTRAVENOUS

## 2017-03-07 MED ORDER — HYDROCODONE-ACETAMINOPHEN 5-325 MG PO TABS
1.0000 | ORAL_TABLET | Freq: Four times a day (QID) | ORAL | Status: DC | PRN
  Administered 2017-03-11 – 2017-03-14 (×4): 1 via ORAL
  Filled 2017-03-07 (×4): qty 1

## 2017-03-07 MED ORDER — BUPIVACAINE-EPINEPHRINE (PF) 0.5% -1:200000 IJ SOLN
INTRAMUSCULAR | Status: DC | PRN
  Administered 2017-03-07: 30 mL via PERINEURAL

## 2017-03-07 MED ORDER — PHENYLEPHRINE 40 MCG/ML (10ML) SYRINGE FOR IV PUSH (FOR BLOOD PRESSURE SUPPORT)
PREFILLED_SYRINGE | INTRAVENOUS | Status: AC
  Filled 2017-03-07: qty 10

## 2017-03-07 MED ORDER — FENTANYL CITRATE (PF) 100 MCG/2ML IJ SOLN
INTRAMUSCULAR | Status: DC | PRN
  Administered 2017-03-07: 25 ug via INTRAVENOUS
  Administered 2017-03-07: 25 ug via INTRATHECAL

## 2017-03-07 MED ORDER — CHLORHEXIDINE GLUCONATE 4 % EX LIQD: 60.0000 mL | Freq: Once | CUTANEOUS | Status: DC

## 2017-03-07 MED ORDER — CEFAZOLIN SODIUM-DEXTROSE 2-4 GM/100ML-% IV SOLN
INTRAVENOUS | Status: AC
  Filled 2017-03-07: qty 100

## 2017-03-07 MED ORDER — SODIUM CHLORIDE 0.9 % IR SOLN
Status: DC | PRN
  Administered 2017-03-07: 1000 mL

## 2017-03-07 MED ORDER — TRAMADOL HCL 50 MG PO TABS
50.0000 mg | ORAL_TABLET | Freq: Four times a day (QID) | ORAL | Status: DC
  Administered 2017-03-07 – 2017-03-14 (×25): 50 mg via ORAL
  Filled 2017-03-07 (×26): qty 1

## 2017-03-07 MED ORDER — CEFAZOLIN SODIUM-DEXTROSE 2-4 GM/100ML-% IV SOLN
2.0000 g | Freq: Four times a day (QID) | INTRAVENOUS | Status: AC
  Administered 2017-03-07 – 2017-03-08 (×2): 2 g via INTRAVENOUS
  Filled 2017-03-07 (×3): qty 100

## 2017-03-07 MED ORDER — DIPHENOXYLATE-ATROPINE 2.5-0.025 MG PO TABS: 1.0000 | ORAL_TABLET | Freq: Four times a day (QID) | ORAL | Status: DC | PRN

## 2017-03-07 MED ORDER — SODIUM CHLORIDE 0.9 % IV SOLN
Freq: Once | INTRAVENOUS | Status: AC
  Administered 2017-03-07: 13:00:00 via INTRAVENOUS

## 2017-03-07 MED ORDER — SODIUM CHLORIDE 0.9 % IV SOLN
Freq: Once | INTRAVENOUS | Status: AC
  Administered 2017-03-07: 10:00:00 via INTRAVENOUS

## 2017-03-07 MED ORDER — FENTANYL CITRATE (PF) 100 MCG/2ML IJ SOLN
INTRAMUSCULAR | Status: AC
  Filled 2017-03-07: qty 2

## 2017-03-07 MED ORDER — POVIDONE-IODINE 10 % EX SWAB: 2.0000 "application " | Freq: Once | CUTANEOUS | Status: DC

## 2017-03-07 MED ORDER — MIDAZOLAM HCL 2 MG/2ML IJ SOLN: 1.0000 mg | INTRAMUSCULAR | Status: DC

## 2017-03-07 MED ORDER — BUPIVACAINE IN DEXTROSE 0.75-8.25 % IT SOLN
INTRATHECAL | Status: DC | PRN
  Administered 2017-03-07: 15 mg via INTRATHECAL

## 2017-03-07 MED ORDER — ASPIRIN EC 325 MG PO TBEC
325.0000 mg | DELAYED_RELEASE_TABLET | Freq: Every day | ORAL | Status: DC
  Administered 2017-03-08 – 2017-03-13 (×6): 325 mg via ORAL
  Filled 2017-03-07 (×6): qty 1

## 2017-03-07 MED ORDER — EPHEDRINE SULFATE 50 MG/ML IJ SOLN
INTRAMUSCULAR | Status: DC | PRN
  Administered 2017-03-07: 5 mg via INTRAVENOUS
  Administered 2017-03-07 (×3): 10 mg via INTRAVENOUS

## 2017-03-07 MED ORDER — DIPHENHYDRAMINE HCL 25 MG PO CAPS
25.0000 mg | ORAL_CAPSULE | Freq: Four times a day (QID) | ORAL | Status: DC | PRN
  Administered 2017-03-07: 25 mg via ORAL
  Filled 2017-03-07: qty 1

## 2017-03-07 MED ORDER — BOOST / RESOURCE BREEZE PO LIQD CUSTOM
1.0000 | Freq: Two times a day (BID) | ORAL | Status: AC
  Administered 2017-03-08: 1 via ORAL
  Filled 2017-03-07: qty 1

## 2017-03-07 MED ORDER — MIDAZOLAM HCL 2 MG/2ML IJ SOLN
INTRAMUSCULAR | Status: AC
  Filled 2017-03-07: qty 2

## 2017-03-07 MED ORDER — FENTANYL CITRATE (PF) 100 MCG/2ML IJ SOLN: 25.0000 ug | INTRAMUSCULAR | Status: DC | PRN

## 2017-03-07 SURGICAL SUPPLY — 57 items
BAG HAMPER (MISCELLANEOUS) ×3 IMPLANT
BIT DRILL AO GAMMA 4.2X300 (BIT) ×3 IMPLANT
BLADE HEX COATED 2.75 (ELECTRODE) ×3 IMPLANT
BLADE SURG SZ10 CARB STEEL (BLADE) ×6 IMPLANT
BNDG GAUZE ELAST 4 BULKY (GAUZE/BANDAGES/DRESSINGS) ×3 IMPLANT
CHLORAPREP W/TINT 26ML (MISCELLANEOUS) ×3 IMPLANT
CLOTH BEACON ORANGE TIMEOUT ST (SAFETY) ×3 IMPLANT
COVER LIGHT HANDLE STERIS (MISCELLANEOUS) ×6 IMPLANT
COVER MAYO STAND XLG (DRAPE) ×3 IMPLANT
DECANTER SPIKE VIAL GLASS SM (MISCELLANEOUS) ×4 IMPLANT
DRAPE STERI IOBAN 125X83 (DRAPES) ×3 IMPLANT
DRESSING ALLEVYN LIFE SACRUM (GAUZE/BANDAGES/DRESSINGS) ×2 IMPLANT
DRSG MEPILEX BORDER 4X12 (GAUZE/BANDAGES/DRESSINGS) ×3 IMPLANT
DRSG PAD ABDOMINAL 8X10 ST (GAUZE/BANDAGES/DRESSINGS) ×3 IMPLANT
ELECT REM PT RETURN 9FT ADLT (ELECTROSURGICAL) ×3
ELECTRODE REM PT RTRN 9FT ADLT (ELECTROSURGICAL) ×1 IMPLANT
GLOVE BIOGEL PI IND STRL 7.0 (GLOVE) ×2 IMPLANT
GLOVE BIOGEL PI INDICATOR 7.0 (GLOVE) ×4
GLOVE ECLIPSE 6.5 STRL STRAW (GLOVE) ×2 IMPLANT
GLOVE SKINSENSE NS SZ8.0 LF (GLOVE) ×2
GLOVE SKINSENSE STRL SZ8.0 LF (GLOVE) ×1 IMPLANT
GLOVE SS N UNI LF 8.5 STRL (GLOVE) ×3 IMPLANT
GOWN STRL REUS W/ TWL LRG LVL3 (GOWN DISPOSABLE) ×1 IMPLANT
GOWN STRL REUS W/TWL LRG LVL3 (GOWN DISPOSABLE) ×6 IMPLANT
GOWN STRL REUS W/TWL XL LVL3 (GOWN DISPOSABLE) ×3 IMPLANT
GUIDEROD T2 3X1000 (ROD) ×3 IMPLANT
INST SET MAJOR BONE (KITS) ×3 IMPLANT
K-WIRE  3.2X450M STR (WIRE) ×2
K-WIRE 3.2X450M STR (WIRE) ×1
KIT BLADEGUARD II DBL (SET/KITS/TRAYS/PACK) ×3 IMPLANT
KIT ROOM TURNOVER AP CYSTO (KITS) ×3 IMPLANT
KWIRE 3.2X450M STR (WIRE) ×1 IMPLANT
MANIFOLD NEPTUNE II (INSTRUMENTS) ×3 IMPLANT
MARKER SKIN DUAL TIP RULER LAB (MISCELLANEOUS) ×3 IMPLANT
NAIL TROCH GAMMA 11X18 (Nail) ×2 IMPLANT
NDL HYPO 21X1.5 SAFETY (NEEDLE) ×1 IMPLANT
NDL SPNL 18GX3.5 QUINCKE PK (NEEDLE) ×1 IMPLANT
NEEDLE HYPO 21X1.5 SAFETY (NEEDLE) ×3 IMPLANT
NEEDLE SPNL 18GX3.5 QUINCKE PK (NEEDLE) ×3 IMPLANT
NS IRRIG 1000ML POUR BTL (IV SOLUTION) ×3 IMPLANT
PACK BASIC III (CUSTOM PROCEDURE TRAY) ×3
PACK SRG BSC III STRL LF ECLPS (CUSTOM PROCEDURE TRAY) ×1 IMPLANT
PENCIL HANDSWITCHING (ELECTRODE) ×3 IMPLANT
SCREW LAG GAMMA 3 TI 10.5X85MM (Screw) ×2 IMPLANT
SCREW LOCKING T2 F/T  5X37.5MM (Screw) ×2 IMPLANT
SCREW LOCKING T2 F/T 5X37.5MM (Screw) IMPLANT
SET BASIN LINEN APH (SET/KITS/TRAYS/PACK) ×3 IMPLANT
SLING ARM FOAM STRAP MED (SOFTGOODS) ×3 IMPLANT
SPONGE LAP 18X18 X RAY DECT (DISPOSABLE) ×6 IMPLANT
STAPLER VISISTAT 35W (STAPLE) IMPLANT
SUT BRALON NAB BRD #1 30IN (SUTURE) ×3 IMPLANT
SUT MNCRL 0 VIOLET CTX 36 (SUTURE) ×1 IMPLANT
SUT MON AB 2-0 CT1 36 (SUTURE) ×3 IMPLANT
SUT MONOCRYL 0 CTX 36 (SUTURE) ×2
SYR 30ML LL (SYRINGE) ×3 IMPLANT
SYR BULB IRRIGATION 50ML (SYRINGE) ×6 IMPLANT
YANKAUER SUCT BULB TIP NO VENT (SUCTIONS) ×3 IMPLANT

## 2017-03-07 NOTE — H&P (View-Only) (Signed)
Consultation hospital new patient to [facility] orthopedics and sports medicine  Consult has been requested by Dr. Cline Cools  Reason for consultation fracture right hip  Chief complaint right hip pain  History 62 year old female with history of walking with a cane has a tremor tripped over her right foot landed on her right side complains of right hip pain starting on February 21.  Her initial pain was severe it was located over the right hip is been there for 1 day is associated with inability to walk but no radiation.  She also has some right wrist pain which x-rays show avulsion fracture ulnar styloid  Review of Systems  Neurological: Positive for tremors.  All other systems reviewed and are negative.  Past Medical History:  Diagnosis Date  . Anxiety   . Bipolar 1 disorder ([place])   . Chronic hip pain   . Chronic kidney disease    uti  currently,  hx bladder spasms  . Depression   . Headache(784.0)   . Neuromuscular disorder (HCC)    shaking of hands   . Stroke [facility])    Past Surgical History:  Procedure Laterality Date  . MULTIPLE EXTRACTIONS WITH ALVEOLOPLASTY N/A 10/30/2012   Procedure: MULTIPLE EXTRACION 5, 6, 8, 9, 10 ,18, 19, 31 WITH MAXILLARY RIGHT AND LEFT  ALVEOLOPLASTY REDUCE MAXILLARY LEFT TUBEROSITY;  Surgeon: Gae Bon, DDS;  Location: [place];  Service: Oral Surgery;  Laterality: N/A;   Family History  Problem Relation Age of Onset  . Breast cancer Mother   . Cancer - Other Mother   . Alcoholism Father    Social History   Tobacco Use  . Smoking status: Current Every Day Smoker    Packs/day: 0.50    Years: 20.00    Pack years: 10.00    Types: Cigarettes  . Smokeless tobacco: Never Used  Substance Use Topics  . Alcohol use: No  . Drug use: No     Current Facility-Administered Medications:  .  0.9 %  sodium chloride infusion, , Intravenous, Continuous, Eugenie Filler, MD, Last Rate: 100 mL/hr at 03/06/17 1510 .  acetaminophen (TYLENOL)  tablet 650 mg, 650 mg, Oral, Q6H PRN **OR** acetaminophen (TYLENOL) suppository 650 mg, 650 mg, Rectal, Q6H PRN, Eugenie Filler, MD .  albuterol (PROVENTIL) (2.5 MG/3ML) 0.083% nebulizer solution 2.5 mg, 2.5 mg, Nebulization, Q2H PRN, Eugenie Filler, MD .  aspirin EC tablet 81 mg, 81 mg, Oral, Daily, Eugenie Filler, MD, 81 mg at 03/06/17 1633 .  benztropine (COGENTIN) tablet 1 mg, 1 mg, Oral, BID, Eugenie Filler, MD, 1 mg at 03/06/17 2327 .  clonazePAM (KLONOPIN) tablet 0.5 mg, 0.5 mg, Oral, QHS, Eugenie Filler, MD, 0.5 mg at 03/06/17 2324 .  divalproex (DEPAKOTE ER) 24 hr tablet 1,500 mg, 1,500 mg, Oral, QHS, Eugenie Filler, MD, 1,500 mg at 03/06/17 2325 .  ipratropium (ATROVENT) nebulizer solution 0.5 mg, 0.5 mg, Nebulization, Q2H PRN, Eugenie Filler, MD .  ketorolac (TORADOL) 15 MG/ML injection 15 mg, 15 mg, Intravenous, Q6H PRN, Eugenie Filler, MD, 15 mg at 03/06/17 2327 .  methimazole (TAPAZOLE) tablet 10 mg, 10 mg, Oral, Daily, Eugenie Filler, MD, 10 mg at 03/06/17 1851 .  methocarbamol (ROBAXIN) tablet 500 mg, 500 mg, Oral, Q6H PRN, 500 mg at 03/06/17 1632 **OR** methocarbamol (ROBAXIN) 500 mg in dextrose 5 % 50 mL IVPB, 500 mg, Intravenous, Q6H PRN, Eugenie Filler, MD .  mometasone-formoterol (DULERA) 100-5 MCG/ACT inhaler 2 puff,  2 puff, Inhalation, BID, Eugenie Filler, MD, 2 puff at 03/06/17 1958 .  morphine 2 MG/ML injection 1-2 mg, 1-2 mg, Intravenous, Q4H PRN, Eugenie Filler, MD, 2 mg at 03/06/17 1632 .  nicotine (NICODERM CQ - dosed in mg/24 hours) patch 14 mg, 14 mg, Transdermal, Daily, Eugenie Filler, MD, 14 mg at 03/06/17 2336 .  ondansetron (ZOFRAN) tablet 4 mg, 4 mg, Oral, Q6H PRN **OR** ondansetron (ZOFRAN) injection 4 mg, 4 mg, Intravenous, Q6H PRN, Eugenie Filler, MD .  oxyCODONE (Oxy IR/ROXICODONE) immediate release tablet 5-10 mg, 5-10 mg, Oral, Q4H PRN, Eugenie Filler, MD, 10 mg at 03/06/17 1855 .  risperiDONE  (RISPERDAL) tablet 4 mg, 4 mg, Oral, QHS, Eugenie Filler, MD, 4 mg at 03/06/17 2326 .  senna-docusate (Senokot-S) tablet 1 tablet, 1 tablet, Oral, QHS PRN, Eugenie Filler, MD .  sodium chloride flush (NS) 0.9 % injection 3 mL, 3 mL, Intravenous, Q12H, Eugenie Filler, MD, 3 mL at 03/06/17 2327 .  sodium phosphate (FLEET) 7-19 GM/118ML enema 1 enema, 1 enema, Rectal, Once PRN, Eugenie Filler, MD .  sorbitol 70 % solution 30 mL, 30 mL, Oral, Daily PRN, Eugenie Filler, MD .  tiotropium [facility]) inhalation capsule 18 mcg, 18 mcg, Inhalation, Daily, Eugenie Filler, MD .  traZODone (DESYREL) tablet 200 mg, 200 mg, Oral, QHS, Eugenie Filler, MD, 200 mg at 03/06/17 2326 .  vitamin B-12 (CYANOCOBALAMIN) tablet 1,000 mcg, 1,000 mcg, Oral, Daily, Eugenie Filler, MD, 1,000 mcg at 03/06/17 1633   No Known Allergies  BP (!) 104/56 (BP Location: Left Arm)   Pulse 71   Temp 97.8 F (36.6 C) (Oral)   Resp 18   Ht 5\' 6"  (1.676 m)   Wt 110 lb (49.9 kg)   SpO2 98%   BMI 17.75 kg/m  Physical Exam  Constitutional: She is oriented to person, place, and time. She appears well-developed and well-nourished. No distress.  HENT:  Head: Normocephalic and atraumatic.  Nose: Nose normal.  Mouth/Throat: No oropharyngeal exudate.  Eyes: Conjunctivae and EOM are normal. Pupils are equal, round, and reactive to light. Right eye exhibits no discharge. Left eye exhibits no discharge. No scleral icterus.  Neck: Normal range of motion. Neck supple. No JVD present. No tracheal deviation present. No thyromegaly present.  Cardiovascular: Normal rate, regular rhythm, normal heart sounds and intact distal pulses.  Pulmonary/Chest: Effort normal. No respiratory distress. She exhibits no tenderness.  Abdominal: Soft. She exhibits no distension and no mass. There is no tenderness. There is no guarding.  Musculoskeletal:       Arms:      Legs: Lymphadenopathy:    She has no cervical adenopathy.    Neurological: She is alert and oriented to person, place, and time. She displays normal reflexes. No cranial nerve deficit or sensory deficit. She exhibits normal muscle tone. Coordination normal.  Normal sensation all 4 extremities upper extremities reflexes normal no pathologic reflexes lower extremity left side reflexes normal coordination upper extremities normal balance cannot be assessed no    Skin: Skin is warm and dry. Capillary refill takes less than 2 seconds. No rash noted. She is not diaphoretic. No erythema. No pallor.  Psychiatric: She has a normal mood and affect. Her behavior is normal. Judgment and thought content normal.    CBC Latest Ref Rng & Units 03/07/2017 03/06/2017 01/26/2016  WBC 4.0 - 10.5 K/uL 5.4 8.7 7.1  Hemoglobin 12.0 - 15.0 g/dL 8.2(L) 11.0(L)  13.6  Hematocrit 36.0 - 46.0 % 24.3(L) 32.2(L) 41.0  Platelets 150 - 400 K/uL 121(L) 161 134(L)   BMP Latest Ref Rng & Units 03/07/2017 03/06/2017 01/26/2016  Glucose 65 - 99 mg/dL 94 118(H) 91  BUN 6 - 20 mg/dL 12 16 21(H)  Creatinine 0.44 - 1.00 mg/dL 0.52 0.56 0.61  Sodium 135 - 145 mmol/L 125(L) 124(L) 136  Potassium 3.5 - 5.1 mmol/L 4.0 4.2 3.4(L)  Chloride 101 - 111 mmol/L 92(L) 88(L) 95(L)  CO2 22 - 32 mmol/L 26 27 34(H)  Calcium 8.9 - 10.3 mg/dL 8.7(L) 9.4 9.4    X-rays right hip and pelvis she has a 3 part intertrochanteric fracture.  There is a fracture line running from the trochanteric region obliquely lateral to medial proximal to distal  She is anemic  She will need open treatment internal fixation of the right hip with intramedullary cephalic device.  She will need transfusion.  I discussed the surgery with her and gave her the risks associated with surgery she agrees to proceed with open treatment internal fixation right hip

## 2017-03-07 NOTE — Brief Op Note (Signed)
03/07/2017  3:07 PM  PATIENT:  Cassidy Smith  62 y.o. female  PRE-OPERATIVE DIAGNOSIS:  right hip intertrochanteric fracture  POST-OPERATIVE DIAGNOSIS:  right hip intertrochanteric fracture  PROCEDURE:  Procedure(s): OPEN TREATMENT INTERNAL FIXATION RIGHT HIP WITH GAMA INTRAMEDULARY NAIL (Right)  SURGEON:  Surgeon(s) and Role:    Carole Civil, MD - Primary  PHYSICIAN ASSISTANT:   ASSISTANTS: cynthia wrenn   ANESTHESIA:   spinal  EBL:  200 mL   BLOOD ADMINISTERED:none  DRAINS: none   LOCAL MEDICATIONS USED:  MARCAINE     SPECIMEN:  No Specimen  DISPOSITION OF SPECIMEN:  N/A  COUNTS:  YES  TOURNIQUET:  * No tourniquets in log *  DICTATION: .Dragon Dictation  PLAN OF CARE: Admit to inpatient   PATIENT DISPOSITION:  PACU - hemodynamically stable.   Delay start of Pharmacological VTE agent (>24hrs) due to surgical blood loss or risk of bleeding: yes

## 2017-03-07 NOTE — Progress Notes (Signed)
Initial Nutrition Assessment  DOCUMENTATION CODES:  Underweight  INTERVENTION:  While on CL diet, Boost Breeze po TID, each supplement provides 250 kcal and 9 grams of protein  When transitioned to Full Liquids, Ensure Enlive po BID, each supplement provides 350 kcal and 20 grams of protein  NUTRITION DIAGNOSIS:  Increased nutrient needs related to post-op healing as evidenced by estimated nutritional requirements for this outcome  GOAL:  Patient will meet greater than or equal to 90% of their needs  MONITOR:  PO intake, Supplement acceptance, Weight trends, Labs  REASON FOR ASSESSMENT:  Consult Hip fracture protocol  ASSESSMENT:  62 y/o female PMHx Bipolar disorder, Anxiety, Depression, tobacco abuse.[place] outside at home and in ED was found to have R hip fX as well as R wrist fx. Also found to have hyponatremia. Admitted for surgical fixation of R hip.   RD tried to see patient today late morning and mid afternoon. Remained in OR/PACU all day. Unable to see.   From chart, patient ate 75% of her Dinner yesterday. Was npo at midnight and has not had any intake since.   Per chart, the patient appears to be chronically underweight. Has been 110-120 for >2 years. Unable to ask about eating habits at home.   Give surgery, will order clear liquid supplements w/ transition to full liquid supplements tomorrow afternoon in anticipation of diet progression. Will monitor PO intake.   Labs: Na: 125, Mag: 1.1, Albumin: 2.7, H/H:8.2/24.3 Meds: Multiple psych meds, b12, IVF, mag, PRN analgesia   NUTRITION - FOCUSED PHYSICAL EXAM: Unable to assess  Diet Order:  Diet NPO time specified Except for: Sips with Meds Diet NPO time specified Except for: Sips with Meds Diet NPO time specified  EDUCATION NEEDS:  No education needs have been identified at this time  Skin: Abrasion to Hip/wrist. Cracking to Heel  Last BM:  2/21  Height:  Ht Readings from Last 1 Encounters:  03/06/17 5\' 6"   (1.676 m)   Weight:  Wt Readings from Last 1 Encounters:  03/06/17 110 lb (49.9 kg)   Wt Readings from Last 10 Encounters:  03/06/17 110 lb (49.9 kg)  01/26/16 186 lb (84.4 kg)  11/30/15 109 lb (49.4 kg)  10/05/15 112 lb (50.8 kg)  07/10/15 121 lb (54.9 kg)  06/07/15 120 lb (54.4 kg)  06/08/15 120 lb (54.4 kg)  11/08/14 119 lb (54 kg)  05/14/13 112 lb 12.8 oz (51.2 kg)  10/22/12 120 lb 11.2 oz (54.7 kg)   Ideal Body Weight:  59.1 kg  BMI:  Body mass index is 17.75 kg/m.  Estimated Nutritional Needs:  Kcal:  1750-2000 kcals (35-40 kcal/kg bw) Protein:  70-80g Pro (1.4-1.6g/kg bw) Fluid:  1.8-2L (1 ml/kcal)  Burtis Junes RD, LDN, CNSC Clinical Nutrition Pager: [phone] 03/07/2017 3:32 PM

## 2017-03-07 NOTE — Anesthesia Procedure Notes (Signed)
Procedure Name: MAC Date/Time: 03/07/2017 1:04 PM Performed by: Ollen Bowl, CRNA Pre-anesthesia Checklist: Patient identified, Timeout performed, Emergency Drugs available, Suction available and Patient being monitored Oxygen Delivery Method: Simple face mask

## 2017-03-07 NOTE — Interval H&P Note (Signed)
History and Physical Interval Note:  03/07/2017 1:03 PM  Cassidy Smith  has presented today for surgery, with the diagnosis of right hip intertrochanteric fracture  The various methods of treatment have been discussed with the patient and family. After consideration of risks, benefits and other options for treatment, the patient has consented to  Procedure(s): OPEN TREATMENT INTERNAL FIXATION RIGHT HIP (Right) as a surgical intervention .  The patient's history has been reviewed, patient examined, no change in status, stable for surgery.  I have reviewed the patient's chart and labs.  Questions were answered to the patient's satisfaction.     Arther Abbott

## 2017-03-07 NOTE — Op Note (Signed)
Operative report for gamma nail  Preop diagnosis right hip fracture fracture type intertrochanteric 3 part  Postop diagnosis same  Procedure open treatment internal fixation  Surgeon Aline Brochure  Anesthesia spinal  Assisted by Marquita Palms  Blood loss was estimated 250 cc  Operative findings 3 part intertrochanteric fracture right hip with stable reduction  Implant gamma nail St[employer]ker, 125 degree short nail with a 37.5 distal locking screw and a 85 mm lag screw with acorn engaged in sliding mode  The procedure was done as follows  The patient was identified in the preop area using standard protocol for identification including date of birth.  Chart review was completed surgical site was evaluated and deemed appropriate for surgery.  It was marked as right hip.  The patient was taken to the operating room where she was spinal anesthetic and placed on the fracture table.  Appropriate padding was performed was placed the left leg was placed in a well leg holder which was well-padded the right leg was placed in traction with a perineal post  The C-arm was brought in and the leg was until a stable reduction was obtained on AP and lateral x-rays  Sterile prep and drape was performed timeout was completed.  I had checked the implants prior to entrance into the room  The incision was made over the greater trochanter extended proximally subcutaneous tissue was then divided down to the fascia which was split in line with the skin incision.  Blunt dissection was carried down to the tip of the trochanter was palpated and visualized.  A curved awl was placed and checked with the C arm once in appropriate position guidewire was placed through the cannulated awl down to the knee this was confirmed with x-ray.  The reamer was passed over the guidewire up to the lesser trochanter removed.  The nail was passed over the guidewire but would not advance and therefore was removed.  Flexible reamers were  passed over the guidewire starting with a 10 progressing to 11, 12 and 13.  The nail was then passed over the guidewire easily.  A separate incision was made for the lag screw.  A periosteal elevator was used to elevate the soft tissue away from the bone the cannula was advanced to bone and a guidewire was placed in the center of the femoral head with a acceptable tip to apex distance.  This was measured 85 mm and the triple reamer was set and passed over the guidewire and the lag screw was placed.  Imaging confirmed position of the screw to be appropriate.  The acorn was passed and engaged the lag screw checked by toggling the distal screw handle.  We then made a third incision for the distal locking screw.  A pointed pointed drill sleeve was advanced to bone 4.2 drill bit was passed through the nail and measured 37.5 mm.  The screw was passed and confirmed to be in position on x-ray  All x-rays confirm the reduction and position of hardware is acceptable  The wounds were irrigated and closed proximally with #1 Braylon 0 Monocryl and 2-0 Monocryl.  The other 2 incision was closed with 2-0 Monocryl.  The proximal wound was injected with 30 cc Marcaine with epinephrine  The patient was taken back to the recovery room after being placed on a regular bed.  She was in stable condition receiving the end of her transfusion.  Postoperative plan  Weightbearing as tolerated  Staples out in 14 days  Aspirin  for DVT prevention  Office follow-up in 2 weeks

## 2017-03-07 NOTE — Anesthesia Procedure Notes (Signed)
Spinal  Patient location during procedure: OR Start time: 03/07/2017 1:26 PM Preanesthetic Checklist Completed: patient identified, site marked, surgical consent, pre-op evaluation, timeout performed, IV checked, risks and benefits discussed and monitors and equipment checked Spinal Block Patient position: right lateral decubitus Prep: Betadine Patient monitoring: heart rate, cardiac monitor, continuous pulse ox and blood pressure Approach: right paramedian Location: L3-4 Injection technique: single-shot Needle Needle type: Spinocan  Needle gauge: 22 G Needle length: 9 cm Assessment Sensory level: T8 Additional Notes ATTEMPTS:2 TRAY AF:[id number] TRAY EXPIRATION DATE:07/14/2018

## 2017-03-07 NOTE — Consult Note (Addendum)
Consultation hospital new patient to [facility] orthopedics and sports medicine  Consult has been requested by Dr. Cline Cools  Reason for consultation fracture right hip  Chief complaint right hip pain  History 62 year old female with history of walking with a cane has a tremor tripped over her right foot landed on her right side complains of right hip pain starting on February 21.  Her initial pain was severe it was located over the right hip is been there for 1 day is associated with inability to walk but no radiation.  She also has some right wrist pain which x-rays show avulsion fracture ulnar styloid  Review of Systems  Neurological: Positive for tremors.  All other systems reviewed and are negative.  Past Medical History:  Diagnosis Date  . Anxiety   . Bipolar 1 disorder ([place])   . Chronic hip pain   . Chronic kidney disease    uti  currently,  hx bladder spasms  . Depression   . Headache(784.0)   . Neuromuscular disorder (H[facility])    shaking of hands   . Stroke [facility])    Past Surgical History:  Procedure Laterality Date  . MULTIPLE EXTRACTIONS WITH ALVEOLOPLASTY N/A 10/30/2012   Procedure: MULTIPLE EXTRACION 5, 6, 8, 9, 10 ,18, 19, 31 WITH MAXILLARY RIGHT AND LEFT  ALVEOLOPLASTY REDUCE MAXILLARY LEFT TUBEROSITY;  Surgeon: Gae Bon, DDS;  Location: [facility];  Service: Oral Surgery;  Laterality: N/A;   Family History  Problem Relation Age of Onset  . Breast cancer Mother   . Cancer - Other Mother   . Alcoholism Father    Social History   Tobacco Use  . Smoking status: Current Every Day Smoker    Packs/day: 0.50    Years: 20.00    Pack years: 10.00    Types: Cigarettes  . Smokeless tobacco: Never Used  Substance Use Topics  . Alcohol use: No  . Drug use: No     Current Facility-Administered Medications:  .  0.9 %  sodium chloride infusion, , Intravenous, Continuous, Eugenie Filler, MD, Last Rate: 100 mL/hr at 03/06/17 1510 .  acetaminophen (TYLENOL)  tablet 650 mg, 650 mg, Oral, Q6H PRN **OR** acetaminophen (TYLENOL) suppository 650 mg, 650 mg, Rectal, Q6H PRN, Eugenie Filler, MD .  albuterol (PROVENTIL) (2.5 MG/3ML) 0.083% nebulizer solution 2.5 mg, 2.5 mg, Nebulization, Q2H PRN, Eugenie Filler, MD .  aspirin EC tablet 81 mg, 81 mg, Oral, Daily, Eugenie Filler, MD, 81 mg at 03/06/17 1633 .  benztropine (COGENTIN) tablet 1 mg, 1 mg, Oral, BID, Eugenie Filler, MD, 1 mg at 03/06/17 2327 .  clonazePAM (KLONOPIN) tablet 0.5 mg, 0.5 mg, Oral, QHS, Eugenie Filler, MD, 0.5 mg at 03/06/17 2324 .  divalproex (DEPAKOTE ER) 24 hr tablet 1,500 mg, 1,500 mg, Oral, QHS, Eugenie Filler, MD, 1,500 mg at 03/06/17 2325 .  ipratropium (ATROVENT) nebulizer solution 0.5 mg, 0.5 mg, Nebulization, Q2H PRN, Eugenie Filler, MD .  ketorolac (TORADOL) 15 MG/ML injection 15 mg, 15 mg, Intravenous, Q6H PRN, Eugenie Filler, MD, 15 mg at 03/06/17 2327 .  methimazole (TAPAZOLE) tablet 10 mg, 10 mg, Oral, Daily, Eugenie Filler, MD, 10 mg at 03/06/17 1851 .  methocarbamol (ROBAXIN) tablet 500 mg, 500 mg, Oral, Q6H PRN, 500 mg at 03/06/17 1632 **OR** methocarbamol (ROBAXIN) 500 mg in dextrose 5 % 50 mL IVPB, 500 mg, Intravenous, Q6H PRN, Eugenie Filler, MD .  mometasone-formoterol (DULERA) 100-5 MCG/ACT inhaler 2 puff,  2 puff, Inhalation, BID, Eugenie Filler, MD, 2 puff at 03/06/17 1958 .  morphine 2 MG/ML injection 1-2 mg, 1-2 mg, Intravenous, Q4H PRN, Eugenie Filler, MD, 2 mg at 03/06/17 1632 .  nicotine (NICODERM CQ - dosed in mg/24 hours) patch 14 mg, 14 mg, Transdermal, Daily, Eugenie Filler, MD, 14 mg at 03/06/17 2336 .  ondansetron (ZOFRAN) tablet 4 mg, 4 mg, Oral, Q6H PRN **OR** ondansetron (ZOFRAN) injection 4 mg, 4 mg, Intravenous, Q6H PRN, Eugenie Filler, MD .  oxyCODONE (Oxy IR/ROXICODONE) immediate release tablet 5-10 mg, 5-10 mg, Oral, Q4H PRN, Eugenie Filler, MD, 10 mg at 03/06/17 1855 .  risperiDONE  (RISPERDAL) tablet 4 mg, 4 mg, Oral, QHS, Eugenie Filler, MD, 4 mg at 03/06/17 2326 .  senna-docusate (Senokot-S) tablet 1 tablet, 1 tablet, Oral, QHS PRN, Eugenie Filler, MD .  sodium chloride flush (NS) 0.9 % injection 3 mL, 3 mL, Intravenous, Q12H, Eugenie Filler, MD, 3 mL at 03/06/17 2327 .  sodium phosphate (FLEET) 7-19 GM/118ML enema 1 enema, 1 enema, Rectal, Once PRN, Eugenie Filler, MD .  sorbitol 70 % solution 30 mL, 30 mL, Oral, Daily PRN, Eugenie Filler, MD .  tiotropium [facility]) inhalation capsule 18 mcg, 18 mcg, Inhalation, Daily, Eugenie Filler, MD .  traZODone (DESYREL) tablet 200 mg, 200 mg, Oral, QHS, Eugenie Filler, MD, 200 mg at 03/06/17 2326 .  vitamin B-12 (CYANOCOBALAMIN) tablet 1,000 mcg, 1,000 mcg, Oral, Daily, Eugenie Filler, MD, 1,000 mcg at 03/06/17 1633   No Known Allergies  BP (!) 104/56 (BP Location: Left Arm)   Pulse 71   Temp 97.8 F (36.6 C) (Oral)   Resp 18   Ht 5\' 6"  (1.676 m)   Wt 110 lb (49.9 kg)   SpO2 98%   BMI 17.75 kg/m  Physical Exam  Constitutional: She is oriented to person, place, and time. She appears well-developed and well-nourished. No distress.  HENT:  Head: Normocephalic and atraumatic.  Nose: Nose normal.  Mouth/Throat: No oropharyngeal exudate.  Eyes: Conjunctivae and EOM are normal. Pupils are equal, round, and reactive to light. Right eye exhibits no discharge. Left eye exhibits no discharge. No scleral icterus.  Neck: Normal range of motion. Neck supple. No JVD present. No tracheal deviation present. No thyromegaly present.  Cardiovascular: Normal rate, regular rhythm, normal heart sounds and intact distal pulses.  Pulmonary/Chest: Effort normal. No respiratory distress. She exhibits no tenderness.  Abdominal: Soft. She exhibits no distension and no mass. There is no tenderness. There is no guarding.  Musculoskeletal:       Arms:      Legs: Lymphadenopathy:    She has no cervical adenopathy.    Neurological: She is alert and oriented to person, place, and time. She displays normal reflexes. No cranial nerve deficit or sensory deficit. She exhibits normal muscle tone. Coordination normal.  Normal sensation all 4 extremities upper extremities reflexes normal no pathologic reflexes lower extremity left side reflexes normal coordination upper extremities normal balance cannot be assessed no    Skin: Skin is warm and dry. Capillary refill takes less than 2 seconds. No rash noted. She is not diaphoretic. No erythema. No pallor.  Psychiatric: She has a normal mood and affect. Her behavior is normal. Judgment and thought content normal.    CBC Latest Ref Rng & Units 03/07/2017 03/06/2017 01/26/2016  WBC 4.0 - 10.5 K/uL 5.4 8.7 7.1  Hemoglobin 12.0 - 15.0 g/dL 8.2(L) 11.0(L)  13.6  Hematocrit 36.0 - 46.0 % 24.3(L) 32.2(L) 41.0  Platelets 150 - 400 K/uL 121(L) 161 134(L)   BMP Latest Ref Rng & Units 03/07/2017 03/06/2017 01/26/2016  Glucose 65 - 99 mg/dL 94 118(H) 91  BUN 6 - 20 mg/dL 12 16 21(H)  Creatinine 0.44 - 1.00 mg/dL 0.52 0.56 0.61  Sodium 135 - 145 mmol/L 125(L) 124(L) 136  Potassium 3.5 - 5.1 mmol/L 4.0 4.2 3.4(L)  Chloride 101 - 111 mmol/L 92(L) 88(L) 95(L)  CO2 22 - 32 mmol/L 26 27 34(H)  Calcium 8.9 - 10.3 mg/dL 8.7(L) 9.4 9.4    X-rays right hip and pelvis she has a 3 part intertrochanteric fracture.  There is a fracture line running from the trochanteric region obliquely lateral to medial proximal to distal  She is anemic  She will need open treatment internal fixation of the right hip with intramedullary cephalic device.  She will need transfusion.  I discussed the surgery with her and gave her the risks associated with surgery she agrees to proceed with open treatment internal fixation right hip

## 2017-03-07 NOTE — Transfer of Care (Signed)
Immediate Anesthesia Transfer of Care Note  Patient: Cassidy Smith  Procedure(s) Performed: OPEN TREATMENT INTERNAL FIXATION RIGHT HIP WITH GAMA INTRAMEDULARY NAIL (Right Hip)  Patient Location: PACU  Anesthesia Type:Spinal  Level of Consciousness: awake, alert  and oriented  Airway & Oxygen Therapy: Patient Spontanous Breathing  Post-op Assessment: Report given to RN  Post vital signs: Reviewed and stable  Last Vitals:  Vitals:   03/07/17 1245 03/07/17 1247  BP:  (!) 148/71  Pulse:  65  Resp: 14 12  Temp:  (!) 36.3 C  SpO2: 100% 100%    Last Pain:  Vitals:   03/07/17 1247  TempSrc: Oral  PainSc:          Complications: No apparent anesthesia complications

## 2017-03-07 NOTE — Progress Notes (Signed)
PROGRESS NOTE    HILDEGARD HLAVAC  MVH:[id number] DOB: 07-May-1955 DOA: 03/06/2017 PCP: [facility]    Brief Narrative:  Patient is a 62 year old female with history of bipolar disorder, anxiety, headache presented to the ED after she tripped and fell on her right side with complaints of right hip pain.  Workup revealed right displaced hip fracture as well as a right nondisplaced radial styloid fracture.  Patient also noted to be hyponatremic.  Patient admitted.  Orthopedics consulted for hip fracture.  Patient to surgery 03/07/2017.   Assessment & Plan:   Principal Problem:   Closed intertrochanteric fracture of hip, right, initial encounter [facility]) Active Problems:   Hyponatremia   Bipolar disorder ([place])   History of depression   Hx of anxiety disorder   Radial styloid fracture: right   Anemia   Hypomagnesemia   #1 closed intertrochanteric fracture of the right hip Secondary to mechanical fall.  Patient denied any syncopal episodes.  Plain films of the right hip and pelvis showing a displaced angulated right intertrochanteric hip fracture.  ED physician states he spoke with Dr. Aline Brochure, orthopedics who assessed patient and patient for surgery today 03/07/2017.  Pain management.  Per orthopedics.    2.  Hyponatremia Likely secondary to hypovolemic hyponatremia.  Patient looked clinically dehydrated on admission.  Patient also noted to be on antipsychotic medications which could be the etiology of her hyponatremia. Urine urine sodium 24, and urine creatinine 145.40. FENa = 0.07.  TSH within normal limits.  Chest x-ray unremarkable.  Cortisol level at 21.7.  Hyponatremia slowly improving.  Increase IV fluids to 125 cc/h.  Follow.   3.  Bipolar disorder/anxiety/depression Currently stable.  Continue home regimen of Cogentin, Klonopin, Depakote, Risperdal.  4.  ?? Hyperthyroidism  TSH at 0.664.  Continue home regimen of Tapazole.   5 right radial styloid  fracture Patient placed in the splints.  Per orthopedics.  6.  Anemia Patient with no overt bleeding.  Hemoglobin currently at 8.2 from 11.0 on admission.  Likely dilutional effect.  Check a FOBT.  Anemia panel consistent with anemia of chronic disease.  2 units of packed red blood cells have been ordered for transfusion preoperatively per orthopedics.  Follow H&H.  7.  History of tobacco abuse Tobacco cessation.  Continue Spiriva and Dulera.  Nebs as needed.  Nicotine patch.  8.  Hypomagnesemia Replete.  Keep magnesium greater than 2.     DVT prophylaxis: SCDs.  Postop per orthopedics. Code Status: Full Family Communication: Updated patient.  No family at bedside. Disposition Plan: To be determined postop.   Consultants:   Orthopedics: Dr. Aline Brochure 03/07/2017  Procedures:   Chest x-ray 03/06/2017  Plain films of the right hip and pelvis 03/06/2017  Plain films of the right wrist 03/06/2017  Antimicrobials:   None   Subjective: Patient complaint of right lower extremity pain.  Patient denies any chest pain or shortness of breath.  Patient denies any overt bleeding.  Objective: Vitals:   03/07/17 0030 03/07/17 0430 03/07/17 0926 03/07/17 0930  BP: 114/68 (!) 104/56    Pulse: 71 71    Resp:      Temp: 98.3 F (36.8 C) 97.8 F (36.6 C)    TempSrc: Oral Oral    SpO2: 99% 98% 97% 97%  Weight:      Height:        Intake/Output Summary (Last 24 hours) at 03/07/2017 0959 Last data filed at 03/07/2017 0853 Gross per 24 hour  Intake  680 ml  Output 825 ml  Net -145 ml   Filed Weights   03/06/17 1510  Weight: 49.9 kg (110 lb)    Examination:  General exam: Appears calm and comfortable  Respiratory system: Clear to auscultation anterior lung fields. Respiratory effort normal. Cardiovascular system: S1 & S2 heard, RRR. No JVD, murmurs, rubs, gallops or clicks. No pedal edema. Gastrointestinal system: Abdomen is nondistended, soft and nontender. No organomegaly  or masses felt. Normal bowel sounds heard. Central nervous system: Alert and oriented. No focal neurological deficits. Extremities: Right lower extremity externally rotated with tenderness to palpation on the right upper thigh.  Right wrist in splint. Skin: No rashes, lesions or ulcers Psychiatry: Judgement and insight appear normal. Mood & affect appropriate.     Data Reviewed: I have personally reviewed following labs and imaging studies  CBC: Recent Labs  Lab 03/06/17 1228 03/07/17 0655  WBC 8.7 5.4  NEUTROABS 6.4  --   HGB 11.0* 8.2*  HCT 32.2* 24.3*  MCV 93.3 92.7  PLT 161 751*   Basic Metabolic Panel: Recent Labs  Lab 03/06/17 1228 03/07/17 0655  NA 124* 125*  K 4.2 4.0  CL 88* 92*  CO2 27 26  GLUCOSE 118* 94  BUN 16 12  CREATININE 0.56 0.52  CALCIUM 9.4 8.7*  MG  --  1.1*   GFR: Estimated Creatinine Clearance: 56.7 mL/min (by C-G formula based on SCr of 0.52 mg/dL). Liver Function Tests: Recent Labs  Lab 03/06/17 1228 03/07/17 0655  AST 19 17  ALT 12* 11*  ALKPHOS 62 51  BILITOT 0.3 0.5  PROT 6.1* 5.2*  ALBUMIN 3.2* 2.7*   No results for input(s): LIPASE, AMYLASE in the last 168 hours. No results for input(s): AMMONIA in the last 168 hours. Coagulation Profile: Recent Labs  Lab 03/07/17 0655  INR 1.01   Cardiac Enzymes: No results for input(s): CKTOTAL, CKMB, CKMBINDEX, TROPONINI in the last 168 hours. BNP (last 3 results) No results for input(s): PROBNP in the last 8760 hours. HbA1C: No results for input(s): HGBA1C in the last 72 hours. CBG: No results for input(s): GLUCAP in the last 168 hours. Lipid Profile: No results for input(s): CHOL, HDL, LDLCALC, TRIG, CHOLHDL, LDLDIRECT in the last 72 hours. Thyroid Function Tests: Recent Labs    03/06/17 1230  TSH 0.664   Anemia Panel: Recent Labs    03/06/17 1500 03/06/17 1516  VITAMINB12 2,438*  --   FOLATE 23.0  --   FERRITIN 102  --   TIBC 283  --   IRON 44  --   RETICCTPCT  --   1.5   Sepsis Labs: No results for input(s): PROCALCITON, LATICACIDVEN in the last 168 hours.  Recent Results (from the past 240 hour(s))  Surgical PCR screen     Status: None   Collection Time: 03/06/17  5:00 PM  Result Value Ref Range Status   MRSA, PCR NEGATIVE NEGATIVE Final   Staphylococcus aureus NEGATIVE NEGATIVE Final    Comment: (NOTE) The Xpert SA Assay (FDA approved for NASAL specimens in patients 36 years of age and older), is one component of a comprehensive surveillance program. It is not intended to diagnose infection nor to guide or monitor treatment. Performed at [facility], [address], [place], [place] [address]          Radiology Studies: Dg Chest 1 View  Result Date: 03/06/2017 CLINICAL DATA:  Fall today with right hip fracture. EXAM: CHEST 1 VIEW COMPARISON:  01/26/2016 FINDINGS:  The cardiomediastinal silhouette is within normal limits. The lungs remain hyperinflated. There is minimal scarring in the left lung base. No confluent airspace opacity, edema, pleural effusion, or pneumothorax is identified. No acute osseous abnormality is seen. IMPRESSION: No active disease. Electronically Signed   By: Logan Bores M.D.   On: 03/06/2017 13:15   Dg Wrist Complete Right  Result Date: 03/06/2017 CLINICAL DATA:  Right wrist pain due to a fall today. Initial encounter. EXAM: RIGHT WRIST - COMPLETE 3+ VIEW COMPARISON:  None. FINDINGS: Subtle cortical irregularity radius at the styloid extending to the articular surface is seen in the distal consistent with a nondisplaced fracture. No other acute bony or joint abnormality is identified. IMPRESSION: Nondisplaced radial styloid fracture. Electronically Signed   By: Inge Rise M.D.   On: 03/06/2017 13:11   Dg Hip Unilat W Or Wo Pelvis 2-3 Views Right  Result Date: 03/06/2017 CLINICAL DATA:  Fall.  Right hip pain and deformity. EXAM: DG HIP (WITH OR WITHOUT PELVIS) 2-3V RIGHT COMPARISON:  11/24/2015. FINDINGS:  Diffuse osteopenia degenerative change. Right intertrochanteric hip fracture with angulation deformity noted. Fractures fragments are displaced. IMPRESSION: Displaced angulated right intertrochanteric hip fracture. Electronically Signed   By: Marcello Moores  Register   On: 03/06/2017 13:09        Scheduled Meds: . aspirin EC  81 mg Oral Daily  . benztropine  1 mg Oral BID  . clonazePAM  0.5 mg Oral QHS  . divalproex  1,500 mg Oral QHS  . furosemide  20 mg Intravenous Once  . methimazole  10 mg Oral Daily  . mometasone-formoterol  2 puff Inhalation BID  . nicotine  14 mg Transdermal Daily  . risperidone  4 mg Oral QHS  . sodium chloride flush  3 mL Intravenous Q12H  . tiotropium  18 mcg Inhalation Daily  . traZODone  200 mg Oral QHS  . vitamin B-12  1,000 mcg Oral Daily   Continuous Infusions: . sodium chloride 125 mL/hr at 03/07/17 0918  . sodium chloride    . magnesium sulfate 1 - 4 g bolus IVPB    . methocarbamol (ROBAXIN)  IV       LOS: 1 day    Time spent: 35 minutes    Irine Seal, MD [facility] Pager [phone]  If 7PM-7AM, please contact night-coverage [email or web address] Password [facility] 03/07/2017, 9:59 AM

## 2017-03-07 NOTE — Anesthesia Preprocedure Evaluation (Signed)
Anesthesia Evaluation  Patient identified by MRN, date of birth, ID band Patient awake    Reviewed: Allergy & Precautions, H&P , NPO status , Patient's Chart, lab work & pertinent test results  History of Anesthesia Complications Negative for: history of anesthetic complications  Airway Mallampati: I  TM Distance: >3 FB Neck ROM: Full    Dental  (+) Poor Dentition, Edentulous Upper, Missing   Pulmonary Current Smoker,    - rhonchi + wheezing      Cardiovascular negative cardio ROS Normal cardiovascular exam Rhythm:Regular Rate:Normal     Neuro/Psych  Headaches, Seizures: tremors.  PSYCHIATRIC DISORDERS Anxiety Depression Bipolar Disorder  Neuromuscular disease CVA    GI/Hepatic Neg liver ROS, GERD  Controlled,  Endo/Other  negative endocrine ROS  Renal/GU Renal diseasenegative Renal ROS   UTI    Musculoskeletal   Abdominal   Peds  Hematology negative hematology ROS (+) anemia ,   Anesthesia Other Findings Hb 8.2 -transfuse  Reproductive/Obstetrics                             Anesthesia Physical Anesthesia Plan  ASA: III  Anesthesia Plan: Spinal   Post-op Pain Management:    Induction: Intravenous  PONV Risk Score and Plan:   Airway Management Planned: Simple Face Mask  Additional Equipment:   Intra-op Plan:   Post-operative Plan:   Informed Consent: I have reviewed the patients History and Physical, chart, labs and discussed the procedure including the risks, benefits and alternatives for the proposed anesthesia with the patient or authorized representative who has indicated his/her understanding and acceptance.     Plan Discussed with:   Anesthesia Plan Comments: (Transfuse 1 PRBC pre-op + 1 intra-op)        Anesthesia Quick Evaluation

## 2017-03-08 LAB — BASIC METABOLIC PANEL
Anion gap: 7 (ref 5–15)
BUN: 9 mg/dL (ref 6–20)
CHLORIDE: 96 mmol/L — AB (ref 101–111)
CO2: 24 mmol/L (ref 22–32)
CREATININE: 0.38 mg/dL — AB (ref 0.44–1.00)
Calcium: 8.3 mg/dL — ABNORMAL LOW (ref 8.9–10.3)
GFR calc non Af Amer: >60 mL/min (ref >60)
Glucose, Bld: 92 mg/dL (ref 65–99)
POTASSIUM: 3.9 mmol/L (ref 3.5–5.1)
Sodium: 127 mmol/L — ABNORMAL LOW (ref 135–145)

## 2017-03-08 LAB — CBC
HEMATOCRIT: 25.5 % — AB (ref 36.0–46.0)
Hemoglobin: 8.8 g/dL — ABNORMAL LOW (ref 12.0–15.0)
MCH: 30.6 pg (ref 26.0–34.0)
MCHC: 34.5 g/dL (ref 30.0–36.0)
MCV: 88.5 fL (ref 78.0–100.0)
Platelets: 92 10*3/uL — ABNORMAL LOW (ref 150–400)
RBC: 2.88 MIL/uL — ABNORMAL LOW (ref 3.87–5.11)
RDW: 15.8 % — AB (ref 11.5–15.5)
WBC: 6.2 10*3/uL (ref 4.0–10.5)

## 2017-03-08 LAB — URINE CULTURE

## 2017-03-08 LAB — MAGNESIUM: Magnesium: 1.1 mg/dL — ABNORMAL LOW (ref 1.7–2.4)

## 2017-03-08 MED ORDER — MAGNESIUM SULFATE 4 GM/100ML IV SOLN
4.0000 g | Freq: Once | INTRAVENOUS | Status: AC
  Administered 2017-03-08: 4 g via INTRAVENOUS
  Filled 2017-03-08: qty 100

## 2017-03-08 NOTE — Progress Notes (Signed)
PROGRESS NOTE    Cassidy Smith  EXB:[id number] DOB: 12-23-1955 DOA: 03/06/2017 PCP: [facility]    Brief Narrative:  Patient is a 62 year old female with history of bipolar disorder, anxiety, headache presented to the ED after she tripped and fell on her right side with complaints of right hip pain.  Workup revealed right displaced hip fracture as well as a right nondisplaced radial styloid fracture.  Patient also noted to be hyponatremic.  Patient admitted.  Orthopedics consulted for hip fracture.  Patient to surgery 03/07/2017.   Assessment & Plan:   Principal Problem:   Closed intertrochanteric fracture of hip, right, initial encounter [facility]) Active Problems:   Hyponatremia   Bipolar disorder ([place])   History of depression   Hx of anxiety disorder   Radial styloid fracture: right   Anemia   Hypomagnesemia   Fall   #1 closed intertrochanteric fracture of the right hip Secondary to mechanical fall.  Patient denied any syncopal episodes.  Plain films of the right hip and pelvis showing a displaced angulated right intertrochanteric hip fracture.  Patient seen in consultation by Dr. Aline Brochure of orthopedics and patient subsequently underwent  OTIF 01/16/7251 with no complications.  Weightbearing as tolerated.  Patient likely skilled nursing facility.  Patient has been placed on aspirin for DVT prophylaxis per orthopedics.  Outpatient follow-up with orthopedics.   2.  Hyponatremia Likely secondary to hypovolemic hyponatremia.  Patient looked clinically dehydrated on admission.  Patient also noted to be on antipsychotic medications which could be the etiology of her hyponatremia. Urine urine sodium 24, and urine creatinine 145.40. FENa = 0.07.  TSH within normal limits.  Chest x-ray unremarkable.  Cortisol level at 21.7.  Hyponatremia slowly improving with hydration.  Continue current rate of IV fluids and follow.Follow.   3.  Bipolar  disorder/anxiety/depression Currently stable.  Continue home regimen of Cogentin, Klonopin, Depakote, Risperdal.  4.  ?? Hyperthyroidism  TSH at 0.664.  Continue home regimen of Tapazole.   5 right radial styloid fracture Patient placed in the splints.  Per orthopedics.  6.  Anemia Patient with no overt bleeding.  Hemoglobin currently at 8.8 from 8.2 from 11.0 on admission.  Likely dilutional effect.  FOBT pending.  Anemia panel consistent with anemia of chronic disease.  Patient status post 2 units of packed red blood cells 03/07/2017 hemoglobin currently stable at 8.8.  Follow H&H.  7.  History of tobacco abuse Tobacco cessation.  Continue Spiriva and Dulera.  Nebs as needed.  Nicotine patch.  8.  Hypomagnesemia Replete.  Keep magnesium greater than 2.     DVT prophylaxis: SCDs/ .  Per orthopedics. Code Status: Full Family Communication: Updated patient and family at bedside. Disposition Plan: Likely skilled nursing facility if patient is agreeable to it.   Consultants:   Orthopedics: Dr. Aline Brochure 03/07/2017  Procedures:   Chest x-ray 03/06/2017  Plain films of the right hip and pelvis 03/06/2017  Plain films of the right wrist 03/06/2017  2 units packed red blood cells transfused 03/07/2017  OTIF/open treatment internal fixation  Antimicrobials:   None   Subjective: Patient complaint of right hip pain.  No chest pain.  No shortness of breath.  Asking for coffee.    Objective: Vitals:   03/08/17 0215 03/08/17 0615 03/08/17 0836 03/08/17 1500  BP: 112/65 122/68  120/65  Pulse: 79 83  80  Resp:      Temp: 98.2 F (36.8 C) 98.5 F (36.9 C)  98.4 F (36.9 C)  TempSrc: Axillary Oral  Oral  SpO2: 93% 96% 94% 95%  Weight:      Height:        Intake/Output Summary (Last 24 hours) at 03/08/2017 1803 Last data filed at 03/08/2017 1746 Gross per 24 hour  Intake 6791.67 ml  Output 1785 ml  Net 5006.67 ml   Filed Weights   03/06/17 1510  Weight: 49.9 kg  (110 lb)    Examination:  General exam: Sitting up in chair. Respiratory system: Lungs clear to auscultation bilaterally.  No wheezes, no crackles, no rhonchi.   Cardiovascular system: Regular rate and rhythm.  No murmurs rubs or gallops.  No lower extremity edema.  Gastrointestinal system: Abdomen is soft, nontender, nondistended, positive bowel sounds.  Central nervous system: Alert and oriented. No focal neurological deficits. Extremities: No clubbing cyanosis or edema.  Right wrist in splint.  Right hip tender to palpation. Skin: No rashes, lesions or ulcers Psychiatry: Judgement and insight appear normal. Mood & affect appropriate.     Data Reviewed: I have personally reviewed following labs and imaging studies  CBC: Recent Labs  Lab 03/06/17 1228 03/07/17 0655 03/08/17 0726  WBC 8.7 5.4 6.2  NEUTROABS 6.4  --   --   HGB 11.0* 8.2* 8.8*  HCT 32.2* 24.3* 25.5*  MCV 93.3 92.7 88.5  PLT 161 121* 92*   Basic Metabolic Panel: Recent Labs  Lab 03/06/17 1228 03/07/17 0655 03/08/17 0726  NA 124* 125* 127*  K 4.2 4.0 3.9  CL 88* 92* 96*  CO2 27 26 24   GLUCOSE 118* 94 92  BUN 16 12 9   CREATININE 0.56 0.52 0.38*  CALCIUM 9.4 8.7* 8.3*  MG  --  1.1* 1.1*   GFR: Estimated Creatinine Clearance: 56.7 mL/min (A) (by C-G formula based on SCr of 0.38 mg/dL (L)). Liver Function Tests: Recent Labs  Lab 03/06/17 1228 03/07/17 0655  AST 19 17  ALT 12* 11*  ALKPHOS 62 51  BILITOT 0.3 0.5  PROT 6.1* 5.2*  ALBUMIN 3.2* 2.7*   No results for input(s): LIPASE, AMYLASE in the last 168 hours. No results for input(s): AMMONIA in the last 168 hours. Coagulation Profile: Recent Labs  Lab 03/07/17 0655  INR 1.01   Cardiac Enzymes: No results for input(s): CKTOTAL, CKMB, CKMBINDEX, TROPONINI in the last 168 hours. BNP (last 3 results) No results for input(s): PROBNP in the last 8760 hours. HbA1C: No results for input(s): HGBA1C in the last 72 hours. CBG: No results for  input(s): GLUCAP in the last 168 hours. Lipid Profile: No results for input(s): CHOL, HDL, LDLCALC, TRIG, CHOLHDL, LDLDIRECT in the last 72 hours. Thyroid Function Tests: Recent Labs    03/06/17 1230  TSH 0.664   Anemia Panel: Recent Labs    03/06/17 1500 03/06/17 1516  VITAMINB12 2,438*  --   FOLATE 23.0  --   FERRITIN 102  --   TIBC 283  --   IRON 44  --   RETICCTPCT  --  1.5   Sepsis Labs: No results for input(s): PROCALCITON, LATICACIDVEN in the last 168 hours.  Recent Results (from the past 240 hour(s))  Culture, Urine     Status: Abnormal   Collection Time: 03/06/17  5:00 PM  Result Value Ref Range Status   Specimen Description   Final    URINE, CLEAN CATCH Performed at [facility], [address], [place], [place] [address]    Special Requests   Final    NONE Performed at [facility]  [address] [place] [place], [place] [address]    Culture (A)  Final    <10,000 COLONIES/mL INSIGNIFICANT GROWTH Performed at [facility] [address] [place], [place] [address]    Report Status 03/08/2017 FINAL  Final  Surgical PCR screen     Status: None   Collection Time: 03/06/17  5:00 PM  Result Value Ref Range Status   MRSA, PCR NEGATIVE NEGATIVE Final   Staphylococcus aureus NEGATIVE NEGATIVE Final    Comment: (NOTE) The Xpert SA Assay (FDA approved for NASAL specimens in patients 53 years of age and older), is one component of a comprehensive surveillance program. It is not intended to diagnose infection nor to guide or monitor treatment. Performed at [facility], [address], [place],  [address]          Radiology Studies: Dg Hip Operative Unilat With Pelvis Right  Result Date: 03/07/2017 CLINICAL DATA:  Right hip fracture. EXAM: OPERATIVE right HIP (WITH PELVIS IF PERFORMED) 2 VIEWS TECHNIQUE: Fluoroscopic spot image(s) were submitted for interpretation post-operatively. COMPARISON:  03/06/2017 FINDINGS: Multiple C-arm images demonstrate the  patient undergoing open reduction and internal fixation of the intertrochanteric fracture of the proximal right femur. Alignment and position of the major fracture fragments is near anatomic. Gama intramedullary nail fixation. IMPRESSION: Open reduction internal fixation of right proximal femur fracture. Electronically Signed   By: Lorriane Shire M.D.   On: 03/07/2017 15:15        Scheduled Meds: . aspirin EC  325 mg Oral Q breakfast  . benztropine  1 mg Oral BID  . clonazePAM  0.5 mg Oral QHS  . divalproex  1,500 mg Oral QHS  . feeding supplement (ENSURE ENLIVE)  237 mL Oral BID BM  . methimazole  10 mg Oral Daily  . mometasone-formoterol  2 puff Inhalation BID  . nicotine  14 mg Transdermal Daily  . risperidone  4 mg Oral QHS  . sodium chloride flush  3 mL Intravenous Q12H  . tiotropium  18 mcg Inhalation Daily  . traMADol  50 mg Oral Q6H  . traZODone  200 mg Oral QHS  . vitamin B-12  1,000 mcg Oral Daily   Continuous Infusions: . sodium chloride 125 mL/hr at 03/08/17 1300  . methocarbamol (ROBAXIN)  IV       LOS: 2 days    Time spent: 40 minutes    Irine Seal, MD [facility] Pager [phone]  If 7PM-7AM, please contact night-coverage [email or web address] Password [facility] 03/08/2017, 6:03 PM

## 2017-03-08 NOTE — Plan of Care (Signed)
  No Outcome Acute Rehab PT Goals(only PT should resolve) Pt Will Go Supine/Side To Sit 03/08/2017 1137 by Geralyn Corwin, PT Flowsheets Taken 03/08/2017 1137  Pt will go Supine/Side to Sit with supervision Patient Will Transfer Sit To/From Stand 03/08/2017 1137 by Geralyn Corwin, PT Flowsheets Taken 03/08/2017 1137  Patient will transfer sit to/from stand with minimal assist Pt Will Transfer Bed To Chair/Chair To Bed 03/08/2017 1137 by Geralyn Corwin, PT Flowsheets Taken 03/08/2017 1137  Pt will Transfer Bed to Chair/Chair to Bed min guard assist Pt Will Ambulate 03/08/2017 1137 by [place] Taken 03/08/2017 1137  Pt will Ambulate 50 feet;with minimal assist;with rolling walker    Geraldine Solar PT, DPT

## 2017-03-08 NOTE — Evaluation (Signed)
Physical Therapy Evaluation Patient Details Name: Cassidy Smith MRN: [id number] DOB: 02-10-1955 Today's Date: 03/08/2017   History of Present Illness  History 62 year old female with history of walking with a cane has a tremor tripped over her right foot landed on her right side complains of right hip pain starting on February 21.  Her initial pain was severe it was located over the right hip is been there for 1 day is associated with inability to walk but no radiation.  She also has some right wrist pain which x-rays show avulsion fracture ulnar styloid  Clinical Impression  Pt received lying in bed and was agreeable to PT evaluation. Pt admitted with above diagnosis. Pt s/p R hip ORIF, IM nail on 03/07/17. She is WBAT. PTA, pt ambulatory with SPC and able to dress and bathe self independently. PT assisted pt off bedpan and cleaning. Pt min A for bed mobility, mainly for RLE negotiation. Max A for STS with RW due to weakness and pain, min-mod A for STP with RW bed>chair. Pain and weakness limiting pt during session. Pt left in chair with call bell and phone within reach. Recommending SNF upon d/c to increase strength, balance, and gait to promote return to PLOF. Will continue to treat in hospital during pt's stay to address problems listed below (see "PT problem list").    Follow Up Recommendations SNF    Equipment Recommendations  None recommended by PT    Recommendations for Other Services       Precautions / Restrictions Precautions Precautions: Fall Precaution Comments: 2-3 falls over the last few years; mechanical fall and hip fracture led to admission Restrictions Weight Bearing Restrictions: No(WBAT R hip)      Mobility  Bed Mobility Overal bed mobility: Needs Assistance Bed Mobility: Rolling;Supine to Sit Rolling: Min assist   Supine to sit: Min assist     General bed mobility comments: mainly for RLE negotiation  Transfers Overall transfer level: Needs  assistance Equipment used: Rolling walker (2 wheeled) Transfers: Sit to/from [facility] Sit to Stand: Max assist Stand pivot transfers: Min assist;Mod assist       General transfer comment: max A for STS due to weakness; min - mod A for STP with RW due to weakness and with RW negotiation  Ambulation/Gait                Stairs            Wheelchair Mobility    Modified Rankin (Stroke Patients Only)       Balance Overall balance assessment: Needs assistance Sitting-balance support: Feet supported Sitting balance-Leahy Scale: Good     Standing balance support: Bilateral upper extremity supported Standing balance-Leahy Scale: Fair                               Pertinent Vitals/Pain Pain Assessment: 0-10 Pain Score: 9  Pain Location: R hip Pain Descriptors / Indicators: Throbbing Pain Intervention(s): Limited activity within patient's tolerance;Repositioned;Monitored during session    [place] expects to be discharged to:: Private residence Living Arrangements: Other relatives(brother) Available Help at Discharge: Family(unsure if she will have 24/7 assistance available) Type of Home: House Home Access: Stairs to enter Entrance Stairs-Rails: Research scientist (medical) of Steps: 1 Home Layout: Laundry or work area in basement;Two level;Able to live on main level with bedroom/bathroom Home Equipment: Kasandra Knudsen - single point;Shower seat - built in  Prior Function Level of Independence: Independent with assistive device(s);Needs assistance   Gait / Transfers Assistance Needed: independent with SPC unlimited community distances, per pt; prior to admission and fall, pt able to ambulate with [facility] unlimited distances (has been using [facility] for about 1 year -- started using to help with her balance)  ADL's / Homemaking Assistance Needed: currently has someone coming out 6hours/day who performes pt's laundry, fixes  lunch, tidies the house for the pt; does not need assistance with dressing or bathing        Hand Dominance   Dominant Hand: Right    Extremity/Trunk Assessment   Upper Extremity Assessment Upper Extremity Assessment: Defer to OT evaluation    Lower Extremity Assessment Lower Extremity Assessment: Generalized weakness    Cervical / Trunk Assessment Cervical / Trunk Assessment: Normal  Communication   Communication: No difficulties  Cognition Arousal/Alertness: Awake/alert Behavior During Therapy: WFL for tasks assessed/performed Overall Cognitive Status: Within Functional Limits for tasks assessed                                        General Comments      Exercises Total Joint Exercises Ankle Circles/Pumps: Both;10 reps;Seated   Assessment/Plan    PT Assessment Patient needs continued PT services  PT Problem List Decreased strength;Decreased activity tolerance;Decreased mobility;Decreased range of motion;Decreased knowledge of use of DME;Pain       PT Treatment Interventions DME instruction;Gait training;Functional mobility training;Therapeutic activities;Therapeutic exercise;Balance training;Patient/family education    PT Goals (Current goals can be found in the Care Plan section)  Acute Rehab PT Goals Patient Stated Goal: home PT Goal Formulation: With patient Time For Goal Achievement: 03/15/17 Potential to Achieve Goals: Good    Frequency 7X/week   Barriers to discharge Decreased caregiver support      Co-evaluation               AM-PAC PT "6 Clicks" Daily Activity  Outcome Measure Difficulty turning over in bed (including adjusting bedclothes, sheets and blankets)?: A Little Difficulty moving from lying on back to sitting on the side of the bed? : A Little Difficulty sitting down on and standing up from a chair with arms (e.g., wheelchair, bedside commode, etc,.)?: A Lot Help needed moving to and from a bed to chair  (including a wheelchair)?: A Lot Help needed walking in hospital room?: A Lot Help needed climbing 3-5 steps with a railing? : Total 6 Click Score: 13    End of Session Equipment Utilized During Treatment: Gait belt Activity Tolerance: Patient tolerated treatment well;Patient limited by pain Patient left: in chair;with call bell/phone within reach;with chair alarm set Nurse Communication: Mobility status(mobility sheet left in room) PT Visit Diagnosis: Muscle weakness (generalized) (M62.81);History of falling (Z91.81);Difficulty in walking, not elsewhere classified (R26.2);Unsteadiness on feet (R26.81);Other abnormalities of gait and mobility (R26.89)    Time: 4259-5638 PT Time Calculation (min) (ACUTE ONLY): 42 min   Charges:   PT Evaluation $PT Eval Low Complexity: 1 Low PT Treatments $Therapeutic Activity: 23-37 mins   PT G Codes:            Geraldine Solar PT, DPT

## 2017-03-09 LAB — CBC
HEMATOCRIT: 22.3 % — AB (ref 36.0–46.0)
HEMOGLOBIN: 7.7 g/dL — AB (ref 12.0–15.0)
MCH: 30.9 pg (ref 26.0–34.0)
MCHC: 34.5 g/dL (ref 30.0–36.0)
MCV: 89.6 fL (ref 78.0–100.0)
Platelets: 87 10*3/uL — ABNORMAL LOW (ref 150–400)
RBC: 2.49 MIL/uL — ABNORMAL LOW (ref 3.87–5.11)
RDW: 15.7 % — ABNORMAL HIGH (ref 11.5–15.5)
WBC: 6.5 10*3/uL (ref 4.0–10.5)

## 2017-03-09 LAB — BASIC METABOLIC PANEL
ANION GAP: 6 (ref 5–15)
BUN: 9 mg/dL (ref 6–20)
CHLORIDE: 100 mmol/L — AB (ref 101–111)
CO2: 26 mmol/L (ref 22–32)
Calcium: 8.7 mg/dL — ABNORMAL LOW (ref 8.9–10.3)
Creatinine, Ser: 0.35 mg/dL — ABNORMAL LOW (ref 0.44–1.00)
GFR calc non Af Amer: >60 mL/min (ref >60)
Glucose, Bld: 103 mg/dL — ABNORMAL HIGH (ref 65–99)
POTASSIUM: 3.6 mmol/L (ref 3.5–5.1)
Sodium: 132 mmol/L — ABNORMAL LOW (ref 135–145)

## 2017-03-09 LAB — MAGNESIUM: Magnesium: 1.4 mg/dL — ABNORMAL LOW (ref 1.7–2.4)

## 2017-03-09 MED ORDER — MAGNESIUM SULFATE 4 GM/100ML IV SOLN
4.0000 g | Freq: Once | INTRAVENOUS | Status: AC
  Administered 2017-03-09: 4 g via INTRAVENOUS
  Filled 2017-03-09: qty 100

## 2017-03-09 MED ORDER — SENNOSIDES-DOCUSATE SODIUM 8.6-50 MG PO TABS
1.0000 | ORAL_TABLET | Freq: Two times a day (BID) | ORAL | Status: DC
  Administered 2017-03-09 – 2017-03-14 (×10): 1 via ORAL
  Filled 2017-03-09 (×11): qty 1

## 2017-03-09 NOTE — Progress Notes (Signed)
Physical Therapy Treatment Patient Details Name: Cassidy Smith MRN: [id number] DOB: August 22, 1955 Today's Date: 03/09/2017    History of Present Illness History 62 year old female with history of walking with a cane has a tremor tripped over her right foot landed on her right side complains of right hip pain starting on February 21.  Her initial pain was severe it was located over the right hip is been there for 1 day is associated with inability to walk but no radiation.  She also has some right wrist pain which x-rays show avulsion fracture ulnar styloid    PT Comments    Pt received lying in bed, brother at bedside, agreeable to PT treatment. PT provided pt with handout of supine/bed or chair level exercises to perform during free time; handout also included seated exercises but pt given clear instructions to only perform those when with a PT and she verbalized understanding. Pt with improved STS transfers this date as she only required min A for STS compared to max A yesterday. Attempted to complete progressed gait this date but pt limited due to weakness and pain in RLE. Pt amb 4 ft in room bed > chair along with STP with min to mod A this date. Continue to recommend SNF upon d/c due to deficits listed below and will continue to follow acutely.    Follow Up Recommendations  SNF     Equipment Recommendations  None recommended by PT    Recommendations for Other Services       Precautions / Restrictions Precautions Precautions: Fall Precaution Comments: weakness, pain Restrictions Weight Bearing Restrictions: Yes RLE Weight Bearing: Weight bearing as tolerated    Mobility  Bed Mobility Overal bed mobility: Needs Assistance Bed Mobility: Rolling;Supine to Sit Rolling: Min assist   Supine to sit: Min assist     General bed mobility comments: mainly for RLE negotiation  Transfers Overall transfer level: Needs assistance Equipment used: Rolling walker (2 wheeled) Transfers:  Sit to/from [facility] Sit to Stand: Min assist Stand pivot transfers: Min assist;Mod assist       General transfer comment: improved STS this date AEB decreased need for supoprt to complete task;  min - mod A for STP with RW due to weakness and with RW negotiation  Ambulation/Gait Ambulation/Gait assistance: Min assist;Mod assist Ambulation Distance (Feet): 4 Feet Assistive device: Rolling walker (2 wheeled) Gait Pattern/deviations: Step-to pattern;Decreased stride length;Antalgic   Gait velocity interpretation: <1.8 ft/sec, indicative of risk for recurrent falls General Gait Details: 11ft in room with RW, bed > chair; min-mod A for weakness and pain limiting   Stairs            Wheelchair Mobility    Modified Rankin (Stroke Patients Only)       Balance Overall balance assessment: Needs assistance Sitting-balance support: Feet supported Sitting balance-Leahy Scale: Good     Standing balance support: Bilateral upper extremity supported Standing balance-Leahy Scale: Fair Standing balance comment: fair/poor due to weakness and pain                            Cognition Arousal/Alertness: Awake/alert Behavior During Therapy: WFL for tasks assessed/performed Overall Cognitive Status: Within Functional Limits for tasks assessed                                        Exercises Total  Joint Exercises Quad Sets: Right;5 reps;Supine;Limitations Quad Sets Limitations: 5" holds Gluteal Sets: Both;5 reps;Supine;Limitations Gluteal Sets Limitations: 5" holds Towel Squeeze: Both;5 reps;Seated;Limitations Towel Squeeze Limitations: 5" holds Hip ABduction/ADduction: Right;AAROM;5 reps;Supine Long Arc Quad: Right;5 reps;Seated Marching in Standing: Right;5 reps;Seated;Limitations Marching in Standing Limitations: x5 reps seated marching at EOB    General Comments        Pertinent Vitals/Pain Pain Assessment: 0-10 Pain Score:  9  Pain Location: R hip Pain Descriptors / Indicators: Throbbing Pain Intervention(s): Limited activity within patient's tolerance;Monitored during session;Premedicated before session;Repositioned    Home Living                      Prior Function            PT Goals (current goals can now be found in the care plan section) Acute Rehab PT Goals Patient Stated Goal: home PT Goal Formulation: With patient Time For Goal Achievement: 03/15/17 Potential to Achieve Goals: Good    Frequency    7X/week      PT Plan      Co-evaluation              AM-PAC PT "6 Clicks" Daily Activity  Outcome Measure  Difficulty turning over in bed (including adjusting bedclothes, sheets and blankets)?: A Little Difficulty moving from lying on back to sitting on the side of the bed? : A Little Difficulty sitting down on and standing up from a chair with arms (e.g., wheelchair, bedside commode, etc,.)?: A Little Help needed moving to and from a bed to chair (including a wheelchair)?: A Lot Help needed walking in hospital room?: A Lot Help needed climbing 3-5 steps with a railing? : Total 6 Click Score: 14    End of Session Equipment Utilized During Treatment: Gait belt;Oxygen(1L) Activity Tolerance: Patient tolerated treatment well;Patient limited by pain Patient left: in chair;with call bell/phone within reach;with chair alarm set Nurse Communication: Mobility status(mobility sheet left in room) PT Visit Diagnosis: Muscle weakness (generalized) (M62.81);History of falling (Z91.81);Difficulty in walking, not elsewhere classified (R26.2);Unsteadiness on feet (R26.81);Other abnormalities of gait and mobility (R26.89)     Time: 1325-1400 PT Time Calculation (min) (ACUTE ONLY): 35 min  Charges:  $Therapeutic Activity: 23-37 mins                    G Codes:          Cassidy Smith PT, DPT

## 2017-03-09 NOTE — Progress Notes (Signed)
PROGRESS NOTE    Cassidy Smith  HYI:[id number] DOB: 03-16-1955 DOA: 03/06/2017 PCP: [facility] [place]    Brief Narrative:  Patient is a 62 year old female with history of bipolar disorder, anxiety, headache presented to the ED after she tripped and fell on her right side with complaints of right hip pain.  Workup revealed right displaced hip fracture as well as a right nondisplaced radial styloid fracture.  Patient also noted to be hyponatremic.  Patient admitted.  Orthopedics consulted for hip fracture.  Patient to surgery 03/07/2017.   Assessment & Plan:   Principal Problem:   Closed intertrochanteric fracture of hip, right, initial encounter [facility]) Active Problems:   Hyponatremia   Bipolar disorder ([place])   History of depression   Hx of anxiety disorder   Radial styloid fracture: right   Anemia   Hypomagnesemia   Fall   #1 closed intertrochanteric fracture of the right hip Secondary to mechanical fall.  Patient denied any syncopal episodes.  Plain films of the right hip and pelvis showing a displaced angulated right intertrochanteric hip fracture.  Patient seen in consultation by Dr. Aline Brochure of orthopedics and patient subsequently underwent  OTIF 7/86/7672 with no complications.  Weightbearing as tolerated.  Patient likely skilled nursing facility.  Patient has been placed on aspirin for DVT prophylaxis per orthopedics.  Outpatient follow-up with orthopedics.   2.  Hyponatremia Likely secondary to hypovolemic hyponatremia.  Patient looked clinically dehydrated on admission.  Patient also noted to be on antipsychotic medications which could be the etiology of her hyponatremia. Urine urine sodium 24, and urine creatinine 145.40. FENa = 0.07.  TSH within normal limits.  Chest x-ray unremarkable.  Cortisol level at 21.7.  Hyponatremia improving with hydration.  Sodium level currently at 132.  Saline lock IV fluids.  Follow.   3.  Bipolar  disorder/anxiety/depression Continue home regimen of Cogentin, Klonopin, Depakote, Risperdal.  4.  ?? Hyperthyroidism  TSH at 0.664.  Continue home regimen of Tapazole.   5 right radial styloid fracture Patient placed in the splint.  Per orthopedics.  6.  Anemia Patient with no overt bleeding.  Hemoglobin currently at 7.7 from 8.8 from 8.2 from 11.0 on admission.  Likely dilutional effect.  FOBT pending.  Anemia panel consistent with anemia of chronic disease.  Patient status post 2 units of packed red blood cells 03/07/2017 hemoglobin currently stable at 7.7.  Saline lock IV fluids.  Follow.    7.  History of tobacco abuse Tobacco cessation.  Continue nicotine patch, Spiriva, Dulera.  Nebs as needed.   8.  Hypomagnesemia Replete.  Keep magnesium greater than 2.     DVT prophylaxis: SCDs/ .  Aspirin per orthopedics. Code Status: Full Family Communication: Updated patient.  No family at bedside.   Disposition Plan: Likely skilled nursing facility if patient is agreeable to it in the next 1-2 days.   Consultants:   Orthopedics: Dr. Aline Brochure 03/07/2017  Procedures:   Chest x-ray 03/06/2017  Plain films of the right hip and pelvis 03/06/2017  Plain films of the right wrist 03/06/2017  2 units packed red blood cells transfused 03/07/2017  OTIF/open treatment internal fixation  Antimicrobials:   None   Subjective: Patient laying in bed.  States has some right hip pain that is relieved with pain medication.  Denies any chest pain or shortness of breath.   Objective: Vitals:   03/08/17 2000 03/09/17 0000 03/09/17 0504 03/09/17 0824  BP: 118/63 133/71 127/66   Pulse: 84 83 88  Resp: 12 12 16    Temp: 97.6 F (36.4 C) 97.8 F (36.6 C) 98.1 F (36.7 C)   TempSrc: Axillary Oral Oral   SpO2: 90% 96% 95% 96%  Weight:   61.3 kg (135 lb 2.3 oz)   Height:        Intake/Output Summary (Last 24 hours) at 03/09/2017 1216 Last data filed at 03/09/2017 0300 Gross per 24  hour  Intake 7145.84 ml  Output 500 ml  Net 6645.84 ml   Filed Weights   03/06/17 1510 03/09/17 0504  Weight: 49.9 kg (110 lb) 61.3 kg (135 lb 2.3 oz)    Examination:  General exam: Sitting up in bed. Respiratory system: CTAB.  No wheezes, no crackles, no rhonchi.  Cardiovascular system: RRR.  No murmurs rubs or gallops.  No lower extremity edema.  Gastrointestinal system: Abdomen is nontender, nondistended, positive bowel sounds, soft, no hepatosplenomegaly.  Central nervous system: Alert and oriented. No focal neurological deficits. Extremities: No clubbing cyanosis or edema.  Right wrist in splint.  Right hip with some tenderness to palpation. Skin: No rashes, lesions or ulcers Psychiatry: Judgement and insight appear normal. Mood & affect appropriate.     Data Reviewed: I have personally reviewed following labs and imaging studies  CBC: Recent Labs  Lab 03/06/17 1228 03/07/17 0655 03/08/17 0726 03/09/17 0606  WBC 8.7 5.4 6.2 6.5  NEUTROABS 6.4  --   --   --   HGB 11.0* 8.2* 8.8* 7.7*  HCT 32.2* 24.3* 25.5* 22.3*  MCV 93.3 92.7 88.5 89.6  PLT 161 121* 92* 87*   Basic Metabolic Panel: Recent Labs  Lab 03/06/17 1228 03/07/17 0655 03/08/17 0726 03/09/17 0606  NA 124* 125* 127* 132*  K 4.2 4.0 3.9 3.6  CL 88* 92* 96* 100*  CO2 27 26 24 26   GLUCOSE 118* 94 92 103*  BUN 16 12 9 9   CREATININE 0.56 0.52 0.38* 0.35*  CALCIUM 9.4 8.7* 8.3* 8.7*  MG  --  1.1* 1.1* 1.4*   GFR: Estimated Creatinine Clearance: 67.4 mL/min (A) (by C-G formula based on SCr of 0.35 mg/dL (L)). Liver Function Tests: Recent Labs  Lab 03/06/17 1228 03/07/17 0655  AST 19 17  ALT 12* 11*  ALKPHOS 62 51  BILITOT 0.3 0.5  PROT 6.1* 5.2*  ALBUMIN 3.2* 2.7*   No results for input(s): LIPASE, AMYLASE in the last 168 hours. No results for input(s): AMMONIA in the last 168 hours. Coagulation Profile: Recent Labs  Lab 03/07/17 0655  INR 1.01   Cardiac Enzymes: No results for  input(s): CKTOTAL, CKMB, CKMBINDEX, TROPONINI in the last 168 hours. BNP (last 3 results) No results for input(s): PROBNP in the last 8760 hours. HbA1C: No results for input(s): HGBA1C in the last 72 hours. CBG: No results for input(s): GLUCAP in the last 168 hours. Lipid Profile: No results for input(s): CHOL, HDL, LDLCALC, TRIG, CHOLHDL, LDLDIRECT in the last 72 hours. Thyroid Function Tests: Recent Labs    03/06/17 1230  TSH 0.664   Anemia Panel: Recent Labs    03/06/17 1500 03/06/17 1516  VITAMINB12 2,438*  --   FOLATE 23.0  --   FERRITIN 102  --   TIBC 283  --   IRON 44  --   RETICCTPCT  --  1.5   Sepsis Labs: No results for input(s): PROCALCITON, LATICACIDVEN in the last 168 hours.  Recent Results (from the past 240 hour(s))  Culture, Urine     Status: Abnormal   Collection  Time: 03/06/17  5:00 PM  Result Value Ref Range Status   Specimen Description   Final    URINE, CLEAN CATCH Performed at [facility], [address], [place], [place] [address]    Special Requests   Final    NONE Performed at [facility], [address] [place], [place] [address]    Culture (A)  Final    <10,000 COLONIES/mL INSIGNIFICANT GROWTH Performed at [facility] [address] [place], [place] [address]    Report Status 03/08/2017 FINAL  Final  Surgical PCR screen     Status: None   Collection Time: 03/06/17  5:00 PM  Result Value Ref Range Status   MRSA, PCR NEGATIVE NEGATIVE Final   Staphylococcus aureus NEGATIVE NEGATIVE Final    Comment: (NOTE) The Xpert SA Assay (FDA approved for NASAL specimens in patients 51 years of age and older), is one component of a comprehensive surveillance program. It is not intended to diagnose infection nor to guide or monitor treatment. Performed at [facility], [address] [place],  [address]          Radiology Studies: Dg Hip Operative Unilat With Pelvis Right  Result Date: 03/07/2017 CLINICAL DATA:  Right hip  fracture. EXAM: OPERATIVE right HIP (WITH PELVIS IF PERFORMED) 2 VIEWS TECHNIQUE: Fluoroscopic spot image(s) were submitted for interpretation post-operatively. COMPARISON:  03/06/2017 FINDINGS: Multiple C-arm images demonstrate the patient undergoing open reduction and internal fixation of the intertrochanteric fracture of the proximal right femur. Alignment and position of the major fracture fragments is near anatomic. Gama intramedullary nail fixation. IMPRESSION: Open reduction internal fixation of right proximal femur fracture. Electronically Signed   By: Lorriane Shire M.D.   On: 03/07/2017 15:15        Scheduled Meds: . aspirin EC  325 mg Oral Q breakfast  . benztropine  1 mg Oral BID  . clonazePAM  0.5 mg Oral QHS  . divalproex  1,500 mg Oral QHS  . feeding supplement (ENSURE ENLIVE)  237 mL Oral BID BM  . methimazole  10 mg Oral Daily  . mometasone-formoterol  2 puff Inhalation BID  . nicotine  14 mg Transdermal Daily  . risperidone  4 mg Oral QHS  . senna-docusate  1 tablet Oral BID  . sodium chloride flush  3 mL Intravenous Q12H  . tiotropium  18 mcg Inhalation Daily  . traMADol  50 mg Oral Q6H  . traZODone  200 mg Oral QHS  . vitamin B-12  1,000 mcg Oral Daily   Continuous Infusions: . magnesium sulfate 1 - 4 g bolus IVPB 4 g (03/09/17 1209)  . methocarbamol (ROBAXIN)  IV       LOS: 3 days    Time spent: 35 minutes    Irine Seal, MD [facility] Pager [phone]  If 7PM-7AM, please contact night-coverage [email or web address] Password [facility] 03/09/2017, 12:16 PM

## 2017-03-09 NOTE — Anesthesia Postprocedure Evaluation (Signed)
Anesthesia Post Note  Patient: Thomas L Coreas  Procedure(s) Performed: OPEN TREATMENT INTERNAL FIXATION RIGHT HIP WITH GAMA INTRAMEDULARY NAIL (Right Hip)  Patient location during evaluation: Nursing Unit Anesthesia Type: Spinal Level of consciousness: awake and alert and oriented Pain management: pain level controlled Vital Signs Assessment: post-procedure vital signs reviewed and stable Respiratory status: spontaneous breathing Cardiovascular status: blood pressure returned to baseline and stable Postop Assessment: no apparent nausea or vomiting, no headache, adequate PO intake and no backache Anesthetic complications: no     Last Vitals:  Vitals:   03/09/17 0504 03/09/17 0824  BP: 127/66   Pulse: 88   Resp: 16   Temp: 36.7 C   SpO2: 95% 96%    Last Pain:  Vitals:   03/09/17 1217  TempSrc:   PainSc: Asleep                 Murray Guzzetta

## 2017-03-10 ENCOUNTER — Encounter (HOSPITAL_COMMUNITY): Payer: Self-pay | Admitting: Orthopedic Surgery

## 2017-03-10 DIAGNOSIS — D649 Anemia, unspecified: Secondary | ICD-10-CM

## 2017-03-10 LAB — TYPE AND SCREEN
ABO/RH(D): O POS
ANTIBODY SCREEN: NEGATIVE
UNIT DIVISION: 0
Unit division: 0
Unit division: 0
Unit division: 0

## 2017-03-10 LAB — BASIC METABOLIC PANEL
Anion gap: 7 (ref 5–15)
BUN: 10 mg/dL (ref 6–20)
CALCIUM: 8.8 mg/dL — AB (ref 8.9–10.3)
CHLORIDE: 96 mmol/L — AB (ref 101–111)
CO2: 29 mmol/L (ref 22–32)
Creatinine, Ser: 0.31 mg/dL — ABNORMAL LOW (ref 0.44–1.00)
GFR calc non Af Amer: >60 mL/min (ref >60)
Glucose, Bld: 93 mg/dL (ref 65–99)
Potassium: 3.5 mmol/L (ref 3.5–5.1)
Sodium: 132 mmol/L — ABNORMAL LOW (ref 135–145)

## 2017-03-10 LAB — CBC
HCT: 20.3 % — ABNORMAL LOW (ref 36.0–46.0)
HEMOGLOBIN: 6.7 g/dL — AB (ref 12.0–15.0)
MCH: 30 pg (ref 26.0–34.0)
MCHC: 33 g/dL (ref 30.0–36.0)
MCV: 91 fL (ref 78.0–100.0)
PLATELETS: 100 10*3/uL — AB (ref 150–400)
RBC: 2.23 MIL/uL — AB (ref 3.87–5.11)
RDW: 15.9 % — ABNORMAL HIGH (ref 11.5–15.5)
WBC: 6.8 10*3/uL (ref 4.0–10.5)

## 2017-03-10 LAB — BPAM RBC
BLOOD PRODUCT EXPIRATION DATE: 201903192359
Blood Product Expiration Date: 201903192359
Blood Product Expiration Date: 201903212359
Blood Product Expiration Date: 201903212359
ISSUE DATE / TIME: 201902221028
ISSUE DATE / TIME: 201902221234
UNIT TYPE AND RH: 5100
UNIT TYPE AND RH: 5100
Unit Type and Rh: 5100
Unit Type and Rh: 5100

## 2017-03-10 LAB — MAGNESIUM: Magnesium: 1.5 mg/dL — ABNORMAL LOW (ref 1.7–2.4)

## 2017-03-10 LAB — PREPARE RBC (CROSSMATCH)

## 2017-03-10 MED ORDER — SODIUM CHLORIDE 0.9 % IV SOLN
Freq: Once | INTRAVENOUS | Status: AC
  Administered 2017-03-10: 12:00:00 via INTRAVENOUS

## 2017-03-10 MED ORDER — RISPERIDONE MICROSPHERES 50 MG IM SUSR
50.0000 mg | INTRAMUSCULAR | Status: DC
  Administered 2017-03-13: 50 mg via INTRAMUSCULAR
  Filled 2017-03-10: qty 2

## 2017-03-10 MED ORDER — MAGNESIUM SULFATE 2 GM/50ML IV SOLN
4.0000 g | Freq: Once | INTRAVENOUS | Status: AC
  Administered 2017-03-10: 4 g via INTRAVENOUS
  Filled 2017-03-10: qty 100

## 2017-03-10 MED ORDER — SODIUM CHLORIDE 0.9 % IV SOLN: Freq: Once | INTRAVENOUS | Status: DC

## 2017-03-10 MED ORDER — MAGNESIUM SULFATE 4 GM/100ML IV SOLN: 4.0000 g | Freq: Once | INTRAVENOUS | Status: DC

## 2017-03-10 MED ORDER — PANTOPRAZOLE SODIUM 40 MG PO TBEC
40.0000 mg | DELAYED_RELEASE_TABLET | Freq: Two times a day (BID) | ORAL | Status: DC
  Administered 2017-03-10 – 2017-03-14 (×8): 40 mg via ORAL
  Filled 2017-03-10 (×8): qty 1

## 2017-03-10 NOTE — Progress Notes (Signed)
PROGRESS NOTE    Cassidy Smith  YFV:[id number] DOB: 10-30-1955 DOA: 03/06/2017 PCP: [facility] [place]    Brief Narrative:  Patient is a 62 year old female with history of bipolar disorder, anxiety, headache presented to the ED after she tripped and fell on her right side with complaints of right hip pain.  Workup revealed right displaced hip fracture as well as a right nondisplaced radial styloid fracture.  Patient also noted to be hyponatremic.  Patient admitted.  Orthopedics consulted for hip fracture.  Patient to surgery 03/07/2017.   Assessment & Plan:   Principal Problem:   Closed intertrochanteric fracture of hip, right, initial encounter [facility]) Active Problems:   Hyponatremia   Bipolar disorder ([place])   History of depression   Hx of anxiety disorder   Radial styloid fracture: right   Anemia   Hypomagnesemia   Fall   #1 closed intertrochanteric fracture of the right hip Secondary to mechanical fall.  Patient denied any syncopal episodes.  Plain films of the right hip and pelvis showing a displaced angulated right intertrochanteric hip fracture.  Patient seen in consultation by Dr. Aline Brochure of orthopedics and patient subsequently underwent  OTIF 1/63/8466 with no complications.  Weightbearing as tolerated.  Patient likely skilled nursing facility.  Patient has been placed on aspirin for DVT prophylaxis per orthopedics.  Outpatient follow-up with orthopedics.   2.  Hyponatremia Likely secondary to hypovolemic hyponatremia.  Patient looked clinically dehydrated on admission.  Patient also noted to be on antipsychotic medications which could be the etiology of her hyponatremia. Urine urine sodium 24, and urine creatinine 145.40. FENa = 0.07.  TSH within normal limits.  Chest x-ray unremarkable.  Cortisol level at 21.7.  Hyponatremia improving with hydration.  Sodium level currently at 132 over the past 24-48 hours.  Saline lock IV fluids.  Follow.   3.  Bipolar  disorder/anxiety/depression Continue home regimen of Cogentin, Klonopin, Depakote, Risperdal.  4.  ?? Hyperthyroidism  TSH at 0.664.  Continue home regimen of Tapazole.   5 right radial styloid fracture Patient placed in the splint.  Per orthopedics.  6.  Anemia Patient with no overt bleeding.  Hemoglobin currently at 6.7 from 7.7 from 8.8 from 8.2 from 11.0 on admission.  Likely dilutional effect.  FOBT pending.  Anemia panel consistent with anemia of chronic disease.  Patient status post 2 units of packed red blood cells 03/07/2017.  Hemoglobin today currently at 6.7.  We will transfuse another 2 units packed red blood cells.  Consult with GI for further evaluation and management.  Continue PPI twice daily.   7.  History of tobacco abuse Tobacco cessation.  Continue Spiriva, Dulera.  Nebs as needed.  Nicotine patch.   8.  Hypomagnesemia Replete.  Keep magnesium greater than 2.     DVT prophylaxis: SCDs/ .  Aspirin per orthopedics. Code Status: Full Family Communication: Updated patient and brother at bedside. Disposition Plan: Likely skilled nursing facility if patient is agreeable to it in the next 1-2 days.   Consultants:   Orthopedics: Dr. Aline Brochure 03/07/2017  Gastroenterology pending  Procedures:   Chest x-ray 03/06/2017  Plain films of the right hip and pelvis 03/06/2017  Plain films of the right wrist 03/06/2017  2 units packed red blood cells transfused 03/07/2017  OTIF/open treatment internal fixation  2 units packed red blood cells to be transfused 03/10/2017  Antimicrobials:   None   Subjective: Patient sitting up in chair.  Patient complaining of fatigue.  No chest pain  no shortness of breath.  Patient denies any overt GI bleed.  Objective: Vitals:   03/10/17 0727 03/10/17 0729 03/10/17 1141 03/10/17 1200  BP:   (!) 122/59   Pulse:   75 80  Resp:   18 18  Temp:   97.8 F (36.6 C) (!) 97.5 F (36.4 C)  TempSrc:   Oral Oral  SpO2: 93% 93% 99%  100%  Weight:      Height:        Intake/Output Summary (Last 24 hours) at 03/10/2017 1239 Last data filed at 03/10/2017 1145 Gross per 24 hour  Intake 1300 ml  Output 650 ml  Net 650 ml   Filed Weights   03/06/17 1510 03/09/17 0504 03/10/17 0500  Weight: 49.9 kg (110 lb) 61.3 kg (135 lb 2.3 oz) 62.3 kg (137 lb 5.6 oz)    Examination:  General exam: Sitting up in chair Respiratory system: Clear to auscultation bilaterally.  No wheezes, no crackles, no rhonchi.  Cardiovascular system: Regular rate rhythm no murmurs rubs or gallops.  No lower extremity edema.  Gastrointestinal system: Abdomen is soft, nontender, nondistended, positive bowel sounds.  No hepatosplenomegaly.  Central nervous system: Alert and oriented. No focal neurological deficits. Extremities: No clubbing cyanosis or edema.  Right wrist in splint.  Right hip with some tenderness to palpation. Skin: No rashes, lesions or ulcers Psychiatry: Judgement and insight appear normal. Mood & affect appropriate.     Data Reviewed: I have personally reviewed following labs and imaging studies  CBC: Recent Labs  Lab 03/06/17 1228 03/07/17 0655 03/08/17 0726 03/09/17 0606 03/10/17 0504  WBC 8.7 5.4 6.2 6.5 6.8  NEUTROABS 6.4  --   --   --   --   HGB 11.0* 8.2* 8.8* 7.7* 6.7*  HCT 32.2* 24.3* 25.5* 22.3* 20.3*  MCV 93.3 92.7 88.5 89.6 91.0  PLT 161 121* 92* 87* 423*   Basic Metabolic Panel: Recent Labs  Lab 03/06/17 1228 03/07/17 0655 03/08/17 0726 03/09/17 0606 03/10/17 0504  NA 124* 125* 127* 132* 132*  K 4.2 4.0 3.9 3.6 3.5  CL 88* 92* 96* 100* 96*  CO2 27 26 24 26 29   GLUCOSE 118* 94 92 103* 93  BUN 16 12 9 9 10   CREATININE 0.56 0.52 0.38* 0.35* 0.31*  CALCIUM 9.4 8.7* 8.3* 8.7* 8.8*  MG  --  1.1* 1.1* 1.4* 1.5*   GFR: Estimated Creatinine Clearance: 67.4 mL/min (A) (by C-G formula based on SCr of 0.31 mg/dL (L)). Liver Function Tests: Recent Labs  Lab 03/06/17 1228 03/07/17 0655  AST 19 17    ALT 12* 11*  ALKPHOS 62 51  BILITOT 0.3 0.5  PROT 6.1* 5.2*  ALBUMIN 3.2* 2.7*   No results for input(s): LIPASE, AMYLASE in the last 168 hours. No results for input(s): AMMONIA in the last 168 hours. Coagulation Profile: Recent Labs  Lab 03/07/17 0655  INR 1.01   Cardiac Enzymes: No results for input(s): CKTOTAL, CKMB, CKMBINDEX, TROPONINI in the last 168 hours. BNP (last 3 results) No results for input(s): PROBNP in the last 8760 hours. HbA1C: No results for input(s): HGBA1C in the last 72 hours. CBG: No results for input(s): GLUCAP in the last 168 hours. Lipid Profile: No results for input(s): CHOL, HDL, LDLCALC, TRIG, CHOLHDL, LDLDIRECT in the last 72 hours. Thyroid Function Tests: No results for input(s): TSH, T4TOTAL, FREET4, T3FREE, THYROIDAB in the last 72 hours. Anemia Panel: No results for input(s): VITAMINB12, FOLATE, FERRITIN, TIBC, IRON, RETICCTPCT in the  last 72 hours. Sepsis Labs: No results for input(s): PROCALCITON, LATICACIDVEN in the last 168 hours.  Recent Results (from the past 240 hour(s))  Culture, Urine     Status: Abnormal   Collection Time: 03/06/17  5:00 PM  Result Value Ref Range Status   Specimen Description   Final    URINE, CLEAN CATCH Performed at [facility], [address], [place], [place] [address]    Special Requests   Final    NONE Performed at [facility], [address], [place], [place] [address]    Culture (A)  Final    <10,000 COLONIES/mL INSIGNIFICANT GROWTH Performed at [facility] [address], [place], [place] [address]    Report Status 03/08/2017 FINAL  Final  Surgical PCR screen     Status: None   Collection Time: 03/06/17  5:00 PM  Result Value Ref Range Status   MRSA, PCR NEGATIVE NEGATIVE Final   Staphylococcus aureus NEGATIVE NEGATIVE Final    Comment: (NOTE) The Xpert SA Assay (FDA approved for NASAL specimens in patients 37 years of age and older), is one component of a  comprehensive surveillance program. It is not intended to diagnose infection nor to guide or monitor treatment. Performed at [facility], [address] [place], [place] [address]          Radiology Studies: No results found.      Scheduled Meds: . aspirin EC  325 mg Oral Q breakfast  . benztropine  1 mg Oral BID  . clonazePAM  0.5 mg Oral QHS  . divalproex  1,500 mg Oral QHS  . feeding supplement (ENSURE ENLIVE)  237 mL Oral BID BM  . methimazole  10 mg Oral Daily  . mometasone-formoterol  2 puff Inhalation BID  . nicotine  14 mg Transdermal Daily  . risperidone  4 mg Oral QHS  . risperiDONE microspheres  50 mg Intramuscular Q14 Days  . senna-docusate  1 tablet Oral BID  . sodium chloride flush  3 mL Intravenous Q12H  . tiotropium  18 mcg Inhalation Daily  . traMADol  50 mg Oral Q6H  . traZODone  200 mg Oral QHS  . vitamin B-12  1,000 mcg Oral Daily   Continuous Infusions: . methocarbamol (ROBAXIN)  IV       LOS: 4 days    Time spent: 35 minutes    Irine Seal, MD [facility] Pager [phone]  If 7PM-7AM, please contact night-coverage [email or web address] Password [facility] 03/10/2017, 12:39 PM

## 2017-03-10 NOTE — Consult Note (Addendum)
Referring Provider: Dr. Grandville Silos  Primary Care Physician:  [facility], [facility] Primary Gastroenterologist:  Dr. Oneida Alar   Date of Admission: 03/06/17 Date of Consultation: 03/10/17  Reason for Consultation:  Anemia   HPI:  Cassidy Smith is a 62 y.o. year old female who underwent a right ORIF due to hip fracture on 2/22 by Dr. Aline Brochure. She presented to the ED on 2/21 after a fall. Hgb on admission was 11.0. She received 2 units PRBCs on 2/22 pre-operatively as Hgb fell 3 grams from admission. Post-operatively, her Hgb was in the 8 range, then dropped to 7.7 yesterday morning. Repeat CBC this morning 6.7. Another 2 units of PRBCs has been ordered and transfusing currently. Unknown hemoccult status. Prior to transfusion, ferrition was 102, iron 44, TIBC 283. Appeared more consistent with anemia of chronic disease.   No overt GI bleeding. States upper abdominal discomfort has been going on since she fell. No nausea or vomiting. No pain with eating. Denies NSAIDs except for baby aspirin at home. Bowel habits fluctuate between constipation and diarrhea. No unexplained weight loss or lack of appetite. No dysphagia. No prior endoscopy or colonoscopy. No family history of colorectal cancer or colon polyps. Feels tired today.   Past Medical History:  Diagnosis Date  . Anxiety   . Bipolar 1 disorder ([place])   . Chronic hip pain   . Chronic kidney disease    uti  currently,  hx bladder spasms  . Depression   . Headache(784.0)   . Neuromuscular disorder (HCC)    shaking of hands   . Stroke [facility])     Past Surgical History:  Procedure Laterality Date  . MULTIPLE EXTRACTIONS WITH ALVEOLOPLASTY N/A 10/30/2012   Procedure: MULTIPLE EXTRACION 5, 6, 8, 9, 10 ,18, 19, 31 WITH MAXILLARY RIGHT AND LEFT  ALVEOLOPLASTY REDUCE MAXILLARY LEFT TUBEROSITY;  Surgeon: Gae Bon, DDS;  Location: [facility];  Service: Oral Surgery;  Laterality: N/A;    Prior to Admission medications   Medication Sig  Start Date End Date Taking? Authorizing Provider  aspirin EC 81 MG EC tablet Take 1 tablet (81 mg total) by mouth daily. 05/15/13  Yes Samuella Cota, MD  benztropine (COGENTIN) 1 MG tablet Take 1 mg by mouth 2 (two) times daily.   Yes [provider]  clonazePAM (KLONOPIN) 0.5 MG tablet Take 0.5 mg by mouth at bedtime.   Yes [provider]  divalproex (DEPAKOTE ER) 500 MG 24 hr tablet Take 1,500 mg by mouth at bedtime.    Yes [provider]  methimazole (TAPAZOLE) 10 MG tablet Take 10 mg by mouth daily.  12/24/15  Yes [provider]  Multiple Vitamins-Minerals (CENTRUM SILVER ADULT 50+ PO) Take 1 tablet by mouth every morning.   Yes [provider]  RISPERDAL CONSTA 50 MG injection Inject 50 mg into the muscle every 14 (fourteen) days. 10/24/14  Yes [provider]  risperidone (RISPERDAL) 4 MG tablet Take 4 mg by mouth at bedtime. 06/22/15  Yes [provider]  traZODone (DESYREL) 100 MG tablet Take 200 mg by mouth at bedtime.   Yes [provider]  vitamin B-12 (CYANOCOBALAMIN) 1000 MCG tablet Take 1,000 mcg by mouth daily.   Yes [provider]    Current Facility-Administered Medications  Medication Dose Route Frequency Provider Last Rate Last Dose  . albuterol (PROVENTIL) (2.5 MG/3ML) 0.083% nebulizer solution 2.5 mg  2.5 mg Nebulization Q2H PRN Eugenie Filler, MD      .  aspirin EC tablet 325 mg  325 mg Oral Q breakfast Carole Civil, MD   325 mg at 03/10/17 1540  . benztropine (COGENTIN) tablet 1 mg  1 mg Oral BID Eugenie Filler, MD   1 mg at 03/10/17 0867  . clonazePAM (KLONOPIN) tablet 0.5 mg  0.5 mg Oral QHS Eugenie Filler, MD   0.5 mg at 03/09/17 2149  . diphenhydrAMINE (BENADRYL) capsule 25 mg  25 mg Oral Q6H PRN Eugenie Filler, MD   25 mg at 03/07/17 1020  . diphenoxylate-atropine (LOMOTIL) 2.5-0.025 MG per tablet 1 tablet  1 tablet Oral QID PRN Carole Civil, MD      .  divalproex (DEPAKOTE ER) 24 hr tablet 1,500 mg  1,500 mg Oral QHS Eugenie Filler, MD   1,500 mg at 03/09/17 2149  . feeding supplement (ENSURE ENLIVE) (ENSURE ENLIVE) liquid 237 mL  237 mL Oral BID BM Eugenie Filler, MD   237 mL at 03/09/17 2148  . HYDROcodone-acetaminophen (NORCO/VICODIN) 5-325 MG per tablet 1 tablet  1 tablet Oral Q6H PRN Carole Civil, MD      . ipratropium (ATROVENT) nebulizer solution 0.5 mg  0.5 mg Nebulization Q2H PRN Eugenie Filler, MD      . menthol-cetylpyridinium (CEPACOL) lozenge 3 mg  1 lozenge Oral PRN Carole Civil, MD       Or  . phenol (CHLORASEPTIC) mouth spray 1 spray  1 spray Mouth/Throat PRN Carole Civil, MD      . methimazole (TAPAZOLE) tablet 10 mg  10 mg Oral Daily Eugenie Filler, MD   10 mg at 03/10/17 6195  . methocarbamol (ROBAXIN) tablet 500 mg  500 mg Oral Q6H PRN Eugenie Filler, MD   500 mg at 03/06/17 1632   Or  . methocarbamol (ROBAXIN) 500 mg in dextrose 5 % 50 mL IVPB  500 mg Intravenous Q6H PRN Eugenie Filler, MD      . metoCLOPramide (REGLAN) tablet 5-10 mg  5-10 mg Oral Q8H PRN Carole Civil, MD       Or  . metoCLOPramide (REGLAN) injection 5-10 mg  5-10 mg Intravenous Q8H PRN Carole Civil, MD      . mometasone-formoterol [facility]) 100-5 MCG/ACT inhaler 2 puff  2 puff Inhalation BID Eugenie Filler, MD   2 puff at 03/10/17 [phone]  . morphine 2 MG/ML injection 1-2 mg  1-2 mg Intravenous Q4H PRN Eugenie Filler, MD   2 mg at 03/07/17 1006  . nicotine (NICODERM CQ - dosed in mg/24 hours) patch 14 mg  14 mg Transdermal Daily Eugenie Filler, MD   14 mg at 03/10/17 0951  . ondansetron (ZOFRAN) tablet 4 mg  4 mg Oral Q6H PRN Eugenie Filler, MD       Or  . ondansetron [facility]) injection 4 mg  4 mg Intravenous Q6H PRN Eugenie Filler, MD      . ondansetron [facility]) tablet 4 mg  4 mg Oral Q6H PRN Carole Civil, MD       Or  . ondansetron [facility]) injection 4 mg  4 mg  Intravenous Q6H PRN Carole Civil, MD      . risperiDONE (RISPERDAL) tablet 4 mg  4 mg Oral QHS Eugenie Filler, MD   4 mg at 03/09/17 2149  . risperiDONE microspheres (RISPERDAL CONSTA) injection 50 mg  50 mg Intramuscular Q14 Days Eugenie Filler, MD      .  senna-docusate (Senokot-S) tablet 1 tablet  1 tablet Oral QHS PRN Eugenie Filler, MD      . senna-docusate (Senokot-S) tablet 1 tablet  1 tablet Oral BID Eugenie Filler, MD   1 tablet at 03/10/17 [phone]  . sodium chloride flush (NS) 0.9 % injection 3 mL  3 mL Intravenous Q12H Eugenie Filler, MD   3 mL at 03/09/17 2149  . sodium phosphate (FLEET) 7-19 GM/118ML enema 1 enema  1 enema Rectal Once PRN Eugenie Filler, MD      . sorbitol 70 % solution 30 mL  30 mL Oral Daily PRN Eugenie Filler, MD      . tiotropium [facility]) inhalation capsule 18 mcg  18 mcg Inhalation Daily Eugenie Filler, MD   18 mcg at 03/10/17 0938  . traMADol (ULTRAM) tablet 50 mg  50 mg Oral Q6H Carole Civil, MD   50 mg at 03/10/17 0550  . traZODone (DESYREL) tablet 200 mg  200 mg Oral QHS Eugenie Filler, MD   200 mg at 03/09/17 2148  . vitamin B-12 (CYANOCOBALAMIN) tablet 1,000 mcg  1,000 mcg Oral Daily Eugenie Filler, MD   1,000 mcg at 03/10/17 1829    Allergies as of 03/06/2017  . (No Known Allergies)    Family History  Problem Relation Age of Onset  . Breast cancer Mother   . Cancer - Other Mother   . Alcoholism Father     Social History   Socioeconomic History  . Marital status: Divorced    Spouse name: Not on file  . Number of children: Not on file  . Years of education: Not on file  . Highest education level: Not on file  Social Needs  . Financial resource strain: Not on file  . Food insecurity - worry: Not on file  . Food insecurity - inability: Not on file  . Transportation needs - medical: Not on file  . Transportation needs - non-medical: Not on file  Occupational History  . Not on file  Tobacco  Use  . Smoking status: Current Every Day Smoker    Packs/day: 0.50    Years: 20.00    Pack years: 10.00    Types: Cigarettes  . Smokeless tobacco: Never Used  Substance and Sexual Activity  . Alcohol use: No  . Drug use: No  . Sexual activity: Not on file  Other Topics Concern  . Not on file  Social History Narrative  . Not on file    Review of Systems: Gen: Denies fever, chills, loss of appetite, change in weight or weight loss CV: Denies chest pain, heart palpitations, syncope, edema  Resp: Denies shortness of breath with rest, cough, wheezing GI: see HPI  GU : Denies urinary burning, urinary frequency, urinary incontinence.  MS: see HPI  Derm: Denies rash, itching, dry skin Psych: Denies depression, anxiety,confusion, or memory loss Heme: Denies bruising, bleeding, and enlarged lymph nodes.  Physical Exam: Vital signs in last 24 hours: Temp:  [97.5 F (36.4 C)-98.7 F (37.1 C)] 97.5 F (36.4 C) (02/25 1200) Pulse Rate:  [59-87] 80 (02/25 1200) Resp:  [18-20] 18 (02/25 1200) BP: (105-132)/(54-67) 122/59 (02/25 1141) SpO2:  [91 %-100 %] 100 % (02/25 1200) Weight:  [137 lb 5.6 oz (62.3 kg)] 137 lb 5.6 oz (62.3 kg) (02/25 0500) Last BM Date: 03/10/17 General:   Alert,  Well-developed, well-nourished, pleasant and cooperative in NAD Head:  Normocephalic and atraumatic. Eyes:  Sclera clear, no icterus.  Ears:  Normal auditory acuity. Mouth:  No deformity or lesions, poor dentition  Lungs:  Clear throughout to auscultation.   Heart:  S1 S2 present without murmurs  Abdomen:  Soft, very minimal TTP upper abdomen, no rebound or guarding, no evidence of ecchymosis. Limited as patient sitting upright in chair Rectal:  Deferred until time of colonoscopy.   Msk:  Symmetrical without gross deformities. Normal posture. Extremities:  Without edema. Neurologic:  Alert and  oriented x4 Psych:  Alert and cooperative. Normal mood and affect.  Intake/Output from previous  day: 02/24 0701 - 02/25 0700 In: 810 [P.O.:700; I.V.:10; IV Piggyback:100] Out: 650 [Urine:650] Intake/Output this shift: Total I/O In: 490 [P.O.:240; I.V.:250] Out: -   Lab Results: Recent Labs    03/08/17 0726 03/09/17 0606 03/10/17 0504  WBC 6.2 6.5 6.8  HGB 8.8* 7.7* 6.7*  HCT 25.5* 22.3* 20.3*  PLT 92* 87* 100*   BMET Recent Labs    03/08/17 0726 03/09/17 0606 03/10/17 0504  NA 127* 132* 132*  K 3.9 3.6 3.5  CL 96* 100* 96*  CO2 24 26 29   GLUCOSE 92 103* 93  BUN 9 9 10   CREATININE 0.38* 0.35* 0.31*  CALCIUM 8.3* 8.7* 8.8*    Impression: 62 year old pleasant female admitted after fall and found to have right hip fracture, s/p ORIF on 2/22. Admitting Hgb 11 and dropped to 8 range prior to procedure, with 2 units PRBCs given pre-operatively. Following procedure, her Hgb was stable in the 8 range but then has drifted over the past 24-48 hours to 6.7 this morning. Additional 2 units PRBCs have been ordered, with 1 completed thus far. She is without any overt GI bleeding. Hemoccult status is unknown. Denies any concerning GI complaints but does note mild upper abdominal soreness since admission: she is without any obvious bruising on exam. No prior colonoscopy or EGD.   Transfusion dependent anemia: without overt GI bleeding. She has been on 81 mg aspirin as outpatient without PPI, now with aspirin 325 mg post-ORIF. Needs PPI BID. Somewhat limited historian, so it is unclear if she has taken any other NSAIDs without knowing. Await hemoccult status. Hold on endoscopic procedure unless overt GI bleeding.   Thrombocytopenia: chronic. Query med-related. No known liver disease and no abdominal imaging on file   Plan: Await hemoccult Await post-transfusion CBC Monitor for overt bleeding PPI BID  Will reassess in the morning   Annitta Needs, PhD, ANP-BC [facility]     LOS: 4 days    03/10/2017, 12:39 PM

## 2017-03-10 NOTE — Progress Notes (Signed)
Dressing changed to rt hip.  Three incision lines are well approximated with staples all were cleansed with alcohol and dressing applied

## 2017-03-10 NOTE — Clinical Social Work Note (Signed)
Patient indicates that she does not want to go to SNF. She states that she will be going home at discharge.     Liam Cammarata, Clydene Pugh, LCSW

## 2017-03-10 NOTE — Progress Notes (Signed)
Patient ID: Cassidy Smith, female   DOB: 08-23-1955, 62 y.o.   MRN: [id number] POD # 3  BP (!) 127/54 (BP Location: Right Arm)   Pulse (!) 59   Temp 97.9 F (36.6 C) (Axillary)   Resp 20   Ht 5\' 6"  (1.676 m)   Wt 137 lb 5.6 oz (62.3 kg)   SpO2 93%   BMI 22.17 kg/m  CBC Latest Ref Rng & Units 03/10/2017 03/09/2017 03/08/2017  WBC 4.0 - 10.5 K/uL 6.8 6.5 6.2  Hemoglobin 12.0 - 15.0 g/dL 6.7(LL) 7.7(L) 8.8(L)  Hematocrit 36.0 - 46.0 % 20.3(L) 22.3(L) 25.5(L)  Platelets 150 - 400 K/uL 100(L) 87(L) 92(L)    NEEDS A TRANSFUSION

## 2017-03-10 NOTE — Evaluation (Signed)
Occupational Therapy Evaluation Patient Details Name: Cassidy Smith MRN: [id number] DOB: 1955-11-13 Today's Date: 03/10/2017    History of Present Illness History 62 year old female with history of walking with a cane has a tremor tripped over her right foot landed on her right side complains of right hip pain starting on February 21.  Her initial pain was severe it was located over the right hip is been there for 1 day is associated with inability to walk but no radiation.  She also has some right wrist pain which x-rays show avulsion fracture ulnar styloid   Clinical Impression   Pt presents with right avulsion fracture of ulnar styloid currently in wrist brace. No order regarding precautions in chart. Limited weightbearing and use as much as possible. Wrist brace in poor application upon entering room and fit was adjusted to provide adequate support to RUE. Pt currently is requiring increased assistance with ADL tasks due to recent ORIF of RUE and fracture to right ulnar styloid while presenting with decreased BUE strength and endurance and decreased knowledge of safety and use of DME. Pt will benefit from skilled OT services to address the mentioned deficits. Recommend SNF at discharge before returning home.    Follow Up Recommendations  SNF    Equipment Recommendations  None recommended by OT       Precautions / Restrictions Precautions Precautions: Fall Precaution Comments: Right avulsion fracture ulnar styloid. No orders regarding precautions in chart. Limit weightbearing as patient is in a wrist brace. Restrictions Weight Bearing Restrictions: Yes RLE Weight Bearing: Weight bearing as tolerated      Mobility Bed Mobility Overal bed mobility: Needs Assistance Bed Mobility: Supine to Sit     Supine to sit: Max assist;HOB elevated        Transfers Overall transfer level: Needs assistance Equipment used: Rolling walker (2 wheeled) Transfers: Sit to/from Stand Sit to  Stand: Mod assist;From elevated surface         General transfer comment: Cueing for sequencing and safety needed during transfer with RW. Pt required constant cueing for safety techniques with using walker.         ADL either performed or assessed with clinical judgement   ADL Overall ADL's : Needs assistance/impaired Eating/Feeding: Modified independent;Sitting           Lower Body Bathing: Total assistance;Sit to/from stand       Lower Body Dressing: Total assistance;Bed level Lower Body Dressing Details (indicate cue type and reason): donning hospital socks Toilet Transfer: Moderate assistance;Cueing for safety;Cueing for sequencing;BSC;RW   Toileting- Clothing Manipulation and Hygiene: Total assistance;Sit to/from stand               Vision Baseline Vision/History: No visual deficits Patient Visual Report: No change from baseline              Pertinent Vitals/Pain Pain Assessment: No/denies pain     Hand Dominance Right   Extremity/Trunk Assessment Upper Extremity Assessment Upper Extremity Assessment: Generalized weakness   Lower Extremity Assessment Lower Extremity Assessment: Defer to PT evaluation       Communication Communication Communication: No difficulties   Cognition Arousal/Alertness: Awake/alert Behavior During Therapy: WFL for tasks assessed/performed Overall Cognitive Status: Within Functional Limits for tasks assessed                   Home Living Family/patient expects to be discharged to:: Skilled nursing facility Living Arrangements: Other relatives(brother) Available Help at Discharge: Family(TBD) Type of Home: House Home Access:  Stairs to enter [employer] of Steps: 1 Entrance Stairs-Rails: Right Home Layout: Laundry or work area in basement;Two level;Able to live on main level with bedroom/bathroom Alternate Level Stairs-Number of Steps: basement, main floor, upstairs Alternate Level Stairs-Rails:  Right Bathroom Shower/Tub: Walk-in shower         Home Equipment: Cane - single point;Shower seat - built in          Prior Functioning/Environment Level of Independence: Independent with assistive device(s);Needs assistance  Gait / Transfers Assistance Needed: independent with SPC unlimited community distances, per pt; prior to admission and fall, pt able to ambulate with [facility] unlimited distances (has been using [facility] for about 1 year -- started using to help with her balance) ADL's / Homemaking Assistance Needed: currently has someone coming out 6hours/day who performes pt's laundry, fixes lunch, tidies the house for the pt; does not need assistance with dressing or bathing            OT Problem List: Decreased strength;Decreased knowledge of use of DME or AE;Decreased knowledge of precautions;Decreased safety awareness      OT Treatment/Interventions: Self-care/ADL training;Balance training;Therapeutic exercise;Therapeutic activities;DME and/or AE instruction;Patient/family education    OT Goals(Current goals can be found in the care plan section) Acute Rehab OT Goals Patient Stated Goal: home OT Goal Formulation: With patient Time For Goal Achievement: 03/24/17 Potential to Achieve Goals: Good  OT Frequency: Min 2X/week    AM-PAC PT "6 Clicks" Daily Activity     Outcome Measure Help from another person eating meals?: None Help from another person taking care of personal grooming?: A Little Help from another person toileting, which includes using toliet, bedpan, or urinal?: Total Help from another person bathing (including washing, rinsing, drying)?: A Lot Help from another person to put on and taking off regular upper body clothing?: Total Help from another person to put on and taking off regular lower body clothing?: Total 6 Click Score: 12   End of Session Equipment Utilized During Treatment: Gait belt;Rolling walker  Activity Tolerance: Patient tolerated treatment  well Patient left: in chair;with call bell/phone within reach;with chair alarm set;with nursing/sitter in room  OT Visit Diagnosis: Muscle weakness (generalized) (M62.81)                Time: 6861-6837 OT Time Calculation (min): 52 min Charges:  OT General Charges $OT Visit: 1 Visit OT Evaluation $OT Eval High Complexity: 1 High OT Treatments $Self Care/Home Management : 38-52 mins(45') G-Codes:     Ailene Ravel, OTR/L,CBIS  [phone]   Rogan Wigley, Clarene Duke 03/10/2017, 9:53 AM

## 2017-03-10 NOTE — Plan of Care (Signed)
  No Outcome Acute Rehab OT Goals (only OT should resolve) Pt. Will Transfer To Toilet 03/10/2017 1004 by Debby Bud, OT Flowsheets Taken 03/10/2017 1004  Pt Will Transfer to Toilet with supervision;bedside commode;regular height toilet;ambulating Pt. Will Perform Toileting-Clothing Manipulation 03/10/2017 1004 by Debby Bud, OT Flowsheets Taken 03/10/2017 1004  Pt Will Perform Toileting - Clothing Manipulation and hygiene with supervision;sit to/from stand;sitting/lateral leans Pt/Caregiver Will Perform Home Exercise Program 03/10/2017 1004 by Debby Bud, OT Flowsheets Taken 03/10/2017 1004  Pt/caregiver will Perform Home Exercise Program Increased strength;Both right and left upper extremity;With written HEP provided OT Additional ADL Goal #1 03/10/2017 1004 by Allysha Tryon, Clarene Duke, OT Flowsheets Taken 03/10/2017 1004  Additional ADL Goal #1 Patient will in standing tolerance to 5-10 minutes in order to be able to complete a daily tasks such as grooming while standing at the sink with supervision.

## 2017-03-10 NOTE — H&P (Addendum)
Night shift floor coverage note  The lab just called a panic value over hemoglobin of 6.7 g/dL.  She underwent open reduction and internal fixation by Dr. Aline Brochure on 03/07/2017.  Her initial hemoglobin was 8.2 g/dL on the 22nd, 7.7 g/dL yesterday.  We will proceed with 1 unit of PRBC transfusion.  Also her magnesium level is 1.5 mg/dL this morning.  He has been consistently low during this hospitalization.  Magnesium sulfate 4 g IV PB ordered.  Tennis Must, MD

## 2017-03-10 NOTE — Progress Notes (Signed)
CRITICAL VALUE ALERT  Critical Value:  Hgb 6.7  Date & Time Notied:  03/10/2017 0630  Provider Notified: MD Olevia Bowens Orders Received/Actions taken: Transfuse 1 unit PRBC; 4g MagnesiumIVPB

## 2017-03-10 NOTE — Progress Notes (Signed)
Physical Therapy Treatment Patient Details Name: Cassidy Smith MRN: [id number] DOB: 05-01-1955 Today's Date: 03/10/2017    History of Present Illness History 62 year old female with history of walking with a cane has a tremor tripped over her right foot landed on her right side complains of right hip pain starting on February 21.  Her initial pain was severe it was located over the right hip is been there for 1 day is associated with inability to walk but no radiation.  She also has some right wrist pain which x-rays show avulsion fracture ulnar styloid    PT Comments    Patient presents up in chair and in between receiving units of blood.  Patient declined to ambulate away from bedside secondary to c/o fatigue/generalized weakness, limited to a few side steps to transfer back to bed and required help to move BLE back onto bed.  Patient will benefit from continued physical therapy in hospital and recommended venue below to increase strength, balance, endurance for safe ADLs and gait.    Follow Up Recommendations  SNF     Equipment Recommendations  None recommended by PT    Recommendations for Other Services       Precautions / Restrictions Precautions Precautions: Fall Precaution Comments: Right avulsion fracture ulnar styloid. No orders regarding precautions in chart. Limit weightbearing as patient is in a wrist brace. Restrictions Weight Bearing Restrictions: Yes RLE Weight Bearing: Weight bearing as tolerated    Mobility  Bed Mobility Overal bed mobility: Needs Assistance Bed Mobility: Sit to Supine       Sit to supine: Mod assist   General bed mobility comments: need assist to move legs back onto bed  Transfers Overall transfer level: Needs assistance Equipment used: Rolling walker (2 wheeled) Transfers: Sit to/from [facility] Sit to Stand: Mod assist Stand pivot transfers: Mod assist;Min assist       General transfer comment: slow labored  movement   Ambulation/Gait Ambulation/Gait assistance: Min assist;Mod assist Ambulation Distance (Feet): 4 Feet Assistive device: Rolling walker (2 wheeled) Gait Pattern/deviations: Decreased step length - right;Decreased stance time - right;Decreased stride length;Antalgic   Gait velocity interpretation: Below normal speed for age/gender General Gait Details: demonstrates 4-5 slow unsteady labored side steps, limited secondary to pain   Stairs            Wheelchair Mobility    Modified Rankin (Stroke Patients Only)       Balance Overall balance assessment: Needs assistance Sitting-balance support: No upper extremity supported;Feet supported Sitting balance-Leahy Scale: Good     Standing balance support: Bilateral upper extremity supported;During functional activity Standing balance-Leahy Scale: Fair Standing balance comment: fair/poor due to                             Cognition Arousal/Alertness: Awake/alert Behavior During Therapy: WFL for tasks assessed/performed Overall Cognitive Status: Within Functional Limits for tasks assessed                                        Exercises Total Joint Exercises Ankle Circles/Pumps: Both;Seated;15 reps;AROM;Strengthening Quad Sets: Seated;AROM;Both;10 reps;Strengthening Gluteal Sets: Seated;AROM;Both;10 reps;Strengthening    General Comments        Pertinent Vitals/Pain Pain Assessment: 0-10 Pain Score: 10-Worst pain ever Pain Location: right hip Pain Descriptors / Indicators: Throbbing Pain Intervention(s): Premedicated before session;Monitored during session;Limited activity within  patient's tolerance    Home Living                      Prior Function            PT Goals (current goals can now be found in the care plan section) Acute Rehab PT Goals Patient Stated Goal: return home PT Goal Formulation: With patient Time For Goal Achievement: 03/24/17 Potential to  Achieve Goals: Good Progress towards PT goals: Progressing toward goals    Frequency    7X/week      PT Plan Current plan remains appropriate    Co-evaluation              AM-PAC PT "6 Clicks" Daily Activity  Outcome Measure  Difficulty turning over in bed (including adjusting bedclothes, sheets and blankets)?: A Little Difficulty moving from lying on back to sitting on the side of the bed? : A Lot Difficulty sitting down on and standing up from a chair with arms (e.g., wheelchair, bedside commode, etc,.)?: A Lot Help needed moving to and from a bed to chair (including a wheelchair)?: A Lot Help needed walking in hospital room?: A Lot Help needed climbing 3-5 steps with a railing? : Total 6 Click Score: 12    End of Session   Activity Tolerance: Patient tolerated treatment well;Patient limited by fatigue Patient left: in bed;with call bell/phone within reach;with nursing/sitter in room;with bed alarm set Nurse Communication: Mobility status PT Visit Diagnosis: Muscle weakness (generalized) (M62.81);History of falling (Z91.81);Difficulty in walking, not elsewhere classified (R26.2);Unsteadiness on feet (R26.81);Other abnormalities of gait and mobility (R26.89)     Time: 4888-9169 PT Time Calculation (min) (ACUTE ONLY): 24 min  Charges:  $Therapeutic Activity: 23-37 mins                    G Codes:       4:25 PM, 2017/03/22 Lonell Grandchild, MPT Physical Therapist with [facility] [phone] office [phone] mobile phone

## 2017-03-11 LAB — BPAM RBC
BLOOD PRODUCT EXPIRATION DATE: 201903182359
Blood Product Expiration Date: 201903182359
ISSUE DATE / TIME: 201902251142
ISSUE DATE / TIME: 201902251541
UNIT TYPE AND RH: 5100
Unit Type and Rh: 5100

## 2017-03-11 LAB — TYPE AND SCREEN
ABO/RH(D): O POS
Antibody Screen: NEGATIVE
Unit division: 0
Unit division: 0

## 2017-03-11 LAB — TROPONIN I

## 2017-03-11 LAB — CBC
HEMATOCRIT: 29.7 % — AB (ref 36.0–46.0)
HEMOGLOBIN: 9.9 g/dL — AB (ref 12.0–15.0)
MCH: 28 pg (ref 26.0–34.0)
MCHC: 33.3 g/dL (ref 30.0–36.0)
MCV: 84.1 fL (ref 78.0–100.0)
Platelets: 131 10*3/uL — ABNORMAL LOW (ref 150–400)
RBC: 3.53 MIL/uL — ABNORMAL LOW (ref 3.87–5.11)
RDW: 20.7 % — ABNORMAL HIGH (ref 11.5–15.5)
WBC: 7.7 10*3/uL (ref 4.0–10.5)

## 2017-03-11 LAB — BASIC METABOLIC PANEL
ANION GAP: 8 (ref 5–15)
BUN: 15 mg/dL (ref 6–20)
CHLORIDE: 93 mmol/L — AB (ref 101–111)
CO2: 28 mmol/L (ref 22–32)
CREATININE: 0.37 mg/dL — AB (ref 0.44–1.00)
Calcium: 8.9 mg/dL (ref 8.9–10.3)
GFR calc non Af Amer: >60 mL/min (ref >60)
Glucose, Bld: 88 mg/dL (ref 65–99)
Potassium: 3.8 mmol/L (ref 3.5–5.1)
SODIUM: 129 mmol/L — AB (ref 135–145)

## 2017-03-11 MED ORDER — GI COCKTAIL ~~LOC~~: 30.0000 mL | Freq: Three times a day (TID) | ORAL | Status: DC | PRN

## 2017-03-11 NOTE — Plan of Care (Signed)
  Education: Knowledge of General Education information will improve 03/11/2017 0010 - Progressing by Cassandria Anger, RN   Clinical Measurements: Ability to maintain clinical measurements within normal limits will improve 03/11/2017 0010 - Progressing by Cassandria Anger, RN   Activity: Risk for activity intolerance will decrease 03/11/2017 0010 - Progressing by Cassandria Anger, RN   Coping: Level of anxiety will decrease 03/11/2017 0010 - Progressing by Cassandria Anger, RN   Pain Managment: General experience of comfort will improve 03/11/2017 0010 - Progressing by Cassandria Anger, RN

## 2017-03-11 NOTE — Clinical Social Work Note (Signed)
LCSW attempted to contact brother via phone. No answer was received and no voicemail was available to leave a message.     Yulissa Needham, Clydene Pugh, LCSW

## 2017-03-11 NOTE — Progress Notes (Signed)
Physical Therapy Treatment Patient Details Name: Cassidy Smith MRN: [id number] DOB: 02-Jan-1956 Today's Date: 03/11/2017    History of Present Illness History 62 year old female with history of walking with a cane has a tremor tripped over her right foot landed on her right side complains of right hip pain starting on February 21.  Her initial pain was severe it was located over the right hip is been there for 1 day is associated with inability to walk but no radiation.  She also has some right wrist pain which x-rays show avulsion fracture ulnar styloid    PT Comments    Patient demonstrates increased gait endurance/distance with slow unsteady cadence requiring assistance to push RW forward, limited mostly due to fatigue and episode of explosive diarrhea, able to transfer to commode in bathroom, tolerated standing with RW to be wiped and ambulate back to bedside.  Patient tolerated sitting up in chair after therapy.  Patient will benefit from continued physical therapy in hospital and recommended venue below to increase strength, balance, endurance for safe ADLs and gait.    Follow Up Recommendations  SNF     Equipment Recommendations  None recommended by PT    Recommendations for Other Services       Precautions / Restrictions Precautions Precautions: Fall Precaution Comments: Right avulsion fracture ulnar styloid. No orders regarding precautions in chart. Limit weightbearing as patient is in a wrist brace. Restrictions Weight Bearing Restrictions: No RLE Weight Bearing: Weight bearing as tolerated    Mobility  Bed Mobility Overal bed mobility: Needs Assistance Bed Mobility: Sit to Supine Rolling: Mod assist         General bed mobility comments: Mod assist for scooting to bedside  Transfers Overall transfer level: Needs assistance Equipment used: Rolling walker (2 wheeled) Transfers: Sit to/from [facility] Sit to Stand: Min assist Stand pivot  transfers: Min assist;Mod assist       General transfer comment: has difficulty maneuvering RW   Ambulation/Gait Ambulation/Gait assistance: Min assist;Mod assist Ambulation Distance (Feet): 15 Feet Assistive device: Rolling walker (2 wheeled) Gait Pattern/deviations: Decreased step length - right;Decreased stance time - right;Decreased stride length   Gait velocity interpretation: Below normal speed for age/gender General Gait Details: slow unsteady labored cadence, difficulty pushing RW forward, mostly limited due to diarrhea   Stairs            Wheelchair Mobility    Modified Rankin (Stroke Patients Only)       Balance Overall balance assessment: Needs assistance Sitting-balance support: Feet supported;No upper extremity supported Sitting balance-Leahy Scale: Good     Standing balance support: Bilateral upper extremity supported;During functional activity Standing balance-Leahy Scale: Fair Standing balance comment: with RW                            Cognition Arousal/Alertness: Awake/alert Behavior During Therapy: WFL for tasks assessed/performed Overall Cognitive Status: Within Functional Limits for tasks assessed                                        Exercises Total Joint Exercises Ankle Circles/Pumps: Both;Seated;AROM;Strengthening;10 reps Quad Sets: Seated;AROM;Both;10 reps;Strengthening Short Arc Quad: Supine;AAROM;Strengthening;Right;10 reps Heel Slides: Supine;AAROM;Strengthening;Right;10 reps    General Comments        Pertinent Vitals/Pain Pain Assessment: Faces Faces Pain Scale: Hurts a little bit Pain Location: right hip Pain Descriptors /  Indicators: Throbbing;Sore    Home Living                      Prior Function            PT Goals (current goals can now be found in the care plan section) Acute Rehab PT Goals Patient Stated Goal: return home PT Goal Formulation: With patient Time For Goal  Achievement: 03/24/17 Potential to Achieve Goals: Good Progress towards PT goals: Progressing toward goals    Frequency    7X/week      PT Plan Current plan remains appropriate    Co-evaluation              AM-PAC PT "6 Clicks" Daily Activity  Outcome Measure  Difficulty turning over in bed (including adjusting bedclothes, sheets and blankets)?: A Little Difficulty moving from lying on back to sitting on the side of the bed? : A Lot Difficulty sitting down on and standing up from a chair with arms (e.g., wheelchair, bedside commode, etc,.)?: A Lot Help needed moving to and from a bed to chair (including a wheelchair)?: A Lot Help needed walking in hospital room?: A Lot Help needed climbing 3-5 steps with a railing? : Total 6 Click Score: 12    End of Session   Activity Tolerance: Patient tolerated treatment well;Patient limited by fatigue(Patient limited due to diarrhea) Patient left: in chair;with call bell/phone within reach;with chair alarm set Nurse Communication: Mobility status PT Visit Diagnosis: Muscle weakness (generalized) (M62.81);History of falling (Z91.81);Difficulty in walking, not elsewhere classified (R26.2);Unsteadiness on feet (R26.81);Other abnormalities of gait and mobility (R26.89)     Time: 3254-9826 PT Time Calculation (min) (ACUTE ONLY): 37 min  Charges:  $Therapeutic Exercise: 8-22 mins $Therapeutic Activity: 8-22 mins                    G Codes:       4:17 PM, Mar 19, 2017 Lonell Grandchild, MPT Physical Therapist with [facility] [phone] office [phone] mobile phone

## 2017-03-11 NOTE — Care Management Note (Signed)
Case Management Note  Patient Details  Name: Cassidy Smith MRN: [id number] Date of Birth: Jan 12, 1956  If discussed at Long Length of Stay Meetings, dates discussed:  03/11/2017  Additional Comments:  Sherald Barge, RN 03/11/2017, 2:16 PM

## 2017-03-11 NOTE — Clinical Social Work Note (Signed)
Clinical Social Work Assessment  Patient Details  Name: Cassidy Smith MRN: [id number] Date of Birth: 1955/10/27  Date of referral:  03/11/17               Reason for consult:  Facility Placement                Permission sought to share information with:  Chartered certified accountant granted to share information::     Name::        Agency::  [facility] [place], [place]  Relationship::     Contact Information:     Housing/Transportation Living arrangements for the past 2 months:  [place] of Information:  Patient, Siblings Patient Interpreter Needed:  None Criminal Activity/Legal Involvement Pertinent to Current Situation/Hospitalization:  No - Comment as needed Significant Relationships:  Siblings Lives with:  Self Do you feel safe going back to the place where you live?  Yes Need for family participation in patient care:  Yes (Comment)  Care giving concerns: Pt lives alone.   Social Worker assessment / plan: Pt is a 62 year old female referred to [place] for SNF placement. Met with pt and her brother today to review recommendations. Pt hesitant but agreeable to SNF referrals. They request [facility] [place] Pt only has Medicaid as a payer source so other facilities may need to be considered.  Pt also has a diagnosis of bipolar disorder and will need additional [place] screening.   Employment status:  Disabled (Comment on whether or not currently receiving Disability) Insurance information:  Medicaid In [place] PT Recommendations:  [place] / Referral to community resources:     Patient/Family's Response to care: Pt and brother accepting of care.  Patient/Family's Understanding of and Emotional Response to Diagnosis, Current Treatment, and Prognosis: Pt and her brother appear to have a good understanding of diagnosis and treatment recommendations. No emotional distress identified.  Emotional  Assessment Appearance:  Appears stated age Attitude/Demeanor/Rapport:    Affect (typically observed):  Accepting, Pleasant Orientation:  Oriented to Self, Oriented to Place, Oriented to Situation Alcohol / Substance use:  Not Applicable Psych involvement (Current and /or in the community):  No (Comment)  Discharge Needs  Concerns to be addressed:  Discharge Planning Concerns Readmission within the last 30 days:  No Current discharge risk:  Physical Impairment, Lives alone Barriers to Discharge:  [place] ([place])   Shade Flood, LCSW 03/11/2017, 12:46 PM

## 2017-03-11 NOTE — NC FL2 (Signed)
[place] MEDICAID FL2 LEVEL OF CARE SCREENING TOOL     IDENTIFICATION  Patient Name: Cassidy Smith Birthdate: [birth year] Sex: female Admission Date (Current Location): 03/06/2017  [place] and [place] Number:  [employer] and Address:  [facility] [facility] [address], [place]      Provider Number: [id number]  Attending Physician Name and Address:  Eugenie Filler, MD  Relative Name and Phone Number:  Lorrin Goodell (brother) [phone]    Current Level of Care: Hospital Recommended Level of Care: [place] Prior Approval Number: pending  Date Approved/Denied:   PASRR Number:    Discharge Plan: SNF    Current Diagnoses: Patient Active Problem List   Diagnosis Date Noted  . Hypomagnesemia 03/07/2017  . Fall   . Closed intertrochanteric fracture of hip, right, initial encounter ([place]) 03/06/2017  . Radial styloid fracture: right 03/06/2017  . Anemia 03/06/2017  . Orthostatic hypotension 06/07/2015  . UTI (urinary tract infection) 06/07/2015  . Rhabdomyolysis 06/07/2015  . White matter abnormality on MRI of brain 05/14/2013  . History of depression 05/14/2013  . Hx of anxiety disorder 05/14/2013  . Bipolar I disorder, most recent episode (or current) manic, unspecified 05/14/2013  . Bipolar 1 disorder ([place]) 05/14/2013  . Hyponatremia 05/12/2013  . Bipolar disorder ([place]) 05/12/2013  . Lacunar infarct, acute 05/12/2013    Orientation RESPIRATION BLADDER Height & Weight     Self, Situation, Place  Normal Continent Weight: 140 lb 3.4 oz (63.6 kg) Height:  5\' 6"  (167.6 cm)  BEHAVIORAL SYMPTOMS/MOOD NEUROLOGICAL BOWEL NUTRITION STATUS      Continent Diet(regular)  AMBULATORY STATUS COMMUNICATION OF NEEDS Skin   Extensive Assist Verbally Normal                       Personal Care Assistance Level of Assistance  Bathing, Feeding, Dressing Bathing Assistance: Limited assistance Feeding assistance: Limited  assistance Dressing Assistance: Limited assistance     Functional Limitations Info  Sight, Hearing, Speech Sight Info: Adequate Hearing Info: Adequate Speech Info: Adequate    SPECIAL CARE FACTORS FREQUENCY  PT (By licensed PT), OT (By licensed OT)     PT Frequency: 5 times week OT Frequency: 5 times week            Contractures Contractures Info: Not present    Additional Factors Info  Code Status, Allergies, Psychotropic Code Status Info: full Allergies Info: no known allergies Psychotropic Info: Klonopin, depakote, risperdal         Current Medications (03/11/2017):  This is the current hospital active medication list Current Facility-Administered Medications  Medication Dose Route Frequency Provider Last Rate Last Dose  . albuterol (PROVENTIL) (2.5 MG/3ML) 0.083% nebulizer solution 2.5 mg  2.5 mg Nebulization Q2H PRN Eugenie Filler, MD      . aspirin EC tablet 325 mg  325 mg Oral Q breakfast Carole Civil, MD   325 mg at 03/11/17 1033  . benztropine (COGENTIN) tablet 1 mg  1 mg Oral BID Eugenie Filler, MD   1 mg at 03/11/17 1033  . clonazePAM (KLONOPIN) tablet 0.5 mg  0.5 mg Oral QHS Eugenie Filler, MD   0.5 mg at 03/10/17 2223  . diphenhydrAMINE (BENADRYL) capsule 25 mg  25 mg Oral Q6H PRN Eugenie Filler, MD   25 mg at 03/07/17 1020  . diphenoxylate-atropine (LOMOTIL) 2.5-0.025 MG per tablet 1 tablet  1 tablet Oral QID PRN Carole Civil,  MD      . divalproex (DEPAKOTE ER) 24 hr tablet 1,500 mg  1,500 mg Oral QHS Eugenie Filler, MD   1,500 mg at 03/10/17 2219  . feeding supplement (ENSURE ENLIVE) (ENSURE ENLIVE) liquid 237 mL  237 mL Oral BID BM Eugenie Filler, MD   237 mL at 03/10/17 2227  . gi cocktail (Maalox,Lidocaine,Donnatal)  30 mL Oral TID PRN Eugenie Filler, MD      . HYDROcodone-acetaminophen (NORCO/VICODIN) 5-325 MG per tablet 1 tablet  1 tablet Oral Q6H PRN Carole Civil, MD   1 tablet at 03/11/17 1052  .  ipratropium (ATROVENT) nebulizer solution 0.5 mg  0.5 mg Nebulization Q2H PRN Eugenie Filler, MD      . menthol-cetylpyridinium (CEPACOL) lozenge 3 mg  1 lozenge Oral PRN Carole Civil, MD       Or  . phenol (CHLORASEPTIC) mouth spray 1 spray  1 spray Mouth/Throat PRN Carole Civil, MD      . methimazole (TAPAZOLE) tablet 10 mg  10 mg Oral Daily Eugenie Filler, MD   10 mg at 03/11/17 1033  . methocarbamol (ROBAXIN) tablet 500 mg  500 mg Oral Q6H PRN Eugenie Filler, MD   500 mg at 03/06/17 1632   Or  . methocarbamol (ROBAXIN) 500 mg in dextrose 5 % 50 mL IVPB  500 mg Intravenous Q6H PRN Eugenie Filler, MD      . metoCLOPramide (REGLAN) tablet 5-10 mg  5-10 mg Oral Q8H PRN Carole Civil, MD       Or  . metoCLOPramide (REGLAN) injection 5-10 mg  5-10 mg Intravenous Q8H PRN Carole Civil, MD      . mometasone-formoterol [facility]) 100-5 MCG/ACT inhaler 2 puff  2 puff Inhalation BID Eugenie Filler, MD   2 puff at 03/11/17 [phone]  . morphine 2 MG/ML injection 1-2 mg  1-2 mg Intravenous Q4H PRN Eugenie Filler, MD   2 mg at 03/07/17 1006  . nicotine (NICODERM CQ - dosed in mg/24 hours) patch 14 mg  14 mg Transdermal Daily Eugenie Filler, MD   14 mg at 03/11/17 1034  . ondansetron (ZOFRAN) tablet 4 mg  4 mg Oral Q6H PRN Eugenie Filler, MD       Or  . ondansetron [facility]) injection 4 mg  4 mg Intravenous Q6H PRN Eugenie Filler, MD      . ondansetron [facility]) tablet 4 mg  4 mg Oral Q6H PRN Carole Civil, MD       Or  . ondansetron [facility]) injection 4 mg  4 mg Intravenous Q6H PRN Carole Civil, MD      . pantoprazole (PROTONIX) EC tablet 40 mg  40 mg Oral BID AC Fields, Sandi L, MD   40 mg at 03/11/17 1033  . risperiDONE (RISPERDAL) tablet 4 mg  4 mg Oral QHS Eugenie Filler, MD   4 mg at 03/11/17 0012  . [START ON 03/13/2017] risperiDONE microspheres (RISPERDAL CONSTA) injection 50 mg  50 mg Intramuscular Q14 Days Eugenie Filler,  MD      . senna-docusate (Senokot-S) tablet 1 tablet  1 tablet Oral QHS PRN Eugenie Filler, MD      . senna-docusate (Senokot-S) tablet 1 tablet  1 tablet Oral BID Eugenie Filler, MD   1 tablet at 03/11/17 1034  . sodium chloride flush (NS) 0.9 % injection 3 mL  3 mL Intravenous Q12H Irine Seal  V, MD   3 mL at 03/11/17 1032  . sodium phosphate (FLEET) 7-19 GM/118ML enema 1 enema  1 enema Rectal Once PRN Eugenie Filler, MD      . sorbitol 70 % solution 30 mL  30 mL Oral Daily PRN Eugenie Filler, MD      . tiotropium [facility]) inhalation capsule 18 mcg  18 mcg Inhalation Daily Eugenie Filler, MD   18 mcg at 03/11/17 [phone]  . traMADol (ULTRAM) tablet 50 mg  50 mg Oral Q6H Carole Civil, MD   50 mg at 03/11/17 0630  . traZODone (DESYREL) tablet 200 mg  200 mg Oral QHS Eugenie Filler, MD   200 mg at 03/10/17 2223  . vitamin B-12 (CYANOCOBALAMIN) tablet 1,000 mcg  1,000 mcg Oral Daily Eugenie Filler, MD   1,000 mcg at 03/11/17 1033     Discharge Medications: Please see discharge summary for a list of discharge medications.  Relevant Imaging Results:  Relevant Lab Results:   Additional Information SSN: [id number], LCSW

## 2017-03-11 NOTE — Progress Notes (Signed)
    Subjective: No overt GI bleeding. No abdominal pain. +flatus. No N/V.   Objective: Vital signs in last 24 hours: Temp:  [97.6 F (36.4 C)-98.5 F (36.9 C)] 98 F (36.7 C) (02/26 1035) Pulse Rate:  [74-83] 78 (02/26 1035) Resp:  [18] 18 (02/26 1035) BP: (125-149)/(62-75) 127/62 (02/26 1035) SpO2:  [90 %-100 %] 96 % (02/26 1035) Weight:  [140 lb 3.4 oz (63.6 kg)] 140 lb 3.4 oz (63.6 kg) (02/26 0551) Last BM Date: 03/10/17 General:   Alert and oriented, pleasant Head:  Normocephalic and atraumatic. Abdomen:  Bowel sounds present, distended but soft, mild anasarca to flanks, no rebound or guarding Extremities:  Without edema. Neurologic:  Alert and  oriented x4  Intake/Output from previous day: 02/25 0701 - 02/26 0700 In: 1603 [P.O.:720; I.V.:253; Blood:630] Out: 700 [Urine:700] Intake/Output this shift: Total I/O In: 723 [P.O.:720; I.V.:3] Out: -   Lab Results: Recent Labs    03/09/17 0606 03/10/17 0504 03/11/17 0615  WBC 6.5 6.8 7.7  HGB 7.7* 6.7* 9.9*  HCT 22.3* 20.3* 29.7*  PLT 87* 100* 131*   BMET Recent Labs    03/09/17 0606 03/10/17 0504 03/11/17 0615  NA 132* 132* 129*  K 3.6 3.5 3.8  CL 100* 96* 93*  CO2 26 29 28   GLUCOSE 103* 93 88  BUN 9 10 15   CREATININE 0.35* 0.31* 0.37*  CALCIUM 8.7* 8.8* 8.9    Assessment: 62 year old pleasant female admitted after fall and found to have right hip fracture, s/p ORIF on 2/22. Admitting Hgb 11 and dropped to 8 range prior to procedure, with 2 units PRBCs given pre-operatively. Following procedure, her Hgb was stable in the 8 range but then has drifted over the past 24-48 hours to 6.7, receiving an additional 2 units PRBCs. Hgb now improved to 9.9. Remains without any overt GI bleeding. Unknown hemoccult status. No indication for endoscopic evaluation unless active GI bleeding or continues with transfusion-dependent anemia.  As of note, abdominal exam with mild distension, possible mild anasarca. No N/V, and  she reports +flatus. Monitor clinically. Possible developing ileus  Thrombocytopenia: chronic. Unknown etiology, possibly med-related. No known liver disease or abdominal imaging on file.   Plan: Hemoccult stool if possible Monitor for overt GI bleeding PPI BID Will follow peripherally Outpatient follow-up will be arranged to discuss colonoscopy/EGD once recovers from current hospitalization.   Cassidy Needs, PhD, ANP-BC [facility]    LOS: 5 days    03/11/2017, 1:14 PM

## 2017-03-11 NOTE — Progress Notes (Signed)
PROGRESS NOTE    Cassidy Smith  FFM:[id number] DOB: January 15, 1955 DOA: 03/06/2017 PCP: [facility]    Brief Narrative:  Patient is a 62 year old female with history of bipolar disorder, anxiety, headache presented to the ED after she tripped and fell on her right side with complaints of right hip pain.  Workup revealed right displaced hip fracture as well as a right nondisplaced radial styloid fracture.  Patient also noted to be hyponatremic.  Patient admitted.  Orthopedics consulted for hip fracture.  Patient to surgery 03/07/2017.   Assessment & Plan:   Principal Problem:   Closed intertrochanteric fracture of hip, right, initial encounter [facility]) Active Problems:   Hyponatremia   Bipolar disorder ([place])   History of depression   Hx of anxiety disorder   Radial styloid fracture: right   Anemia   Hypomagnesemia   Fall   #1 closed intertrochanteric fracture of the right hip Secondary to mechanical fall.  Patient denied any syncopal episodes.  Plain films of the right hip and pelvis showing a displaced angulated right intertrochanteric hip fracture.  Patient seen in consultation by Dr. Aline Brochure of orthopedics and patient subsequently underwent  OTIF 5/70/1779 with no complications.  Weightbearing as tolerated.  Patient likely skilled nursing facility.  Patient has been placed on aspirin for DVT prophylaxis per orthopedics.  Outpatient follow-up with orthopedics.   2.  Hyponatremia Likely secondary to hypovolemic hyponatremia.  Patient looked clinically dehydrated on admission.  Patient also noted to be on antipsychotic medications which could be the etiology of her hyponatremia. Urine urine sodium 24, and urine creatinine 145.40. FENa = 0.07.  TSH within normal limits.  Chest x-ray unremarkable.  Cortisol level at 21.7.  Hyponatremia improving with hydration currently seems to be fluctuating.  IV fluids discontinued.  Sodium currently at 129.  Sodium fluctuating between  139 and 132.  Follow.  3.  Bipolar disorder/anxiety/depression Patient currently stable.  No suicidal or homicidal ideations.  Continue home regimen of Cogentin, Klonopin, Depakote, Risperdal.  4.  ?? Hyperthyroidism  TSH at 0.664.  Continue home regimen of Tapazole.   5 right radial styloid fracture Patient placed in the splint.  Outpatient follow-up with orthopedics. Per orthopedics.  6.  Anemia Patient with no overt bleeding.  Hemoglobin currently at 9.9 from 6.7 from 7.7 from 8.8 from 8.2 from 11.0 on admission.  Likely dilutional effect.  FOBT pending.  Denies any overt bleeding.  Anemia panel consistent with anemia of chronic disease.  Patient status post 2 units of packed red blood cells 03/07/2017.  Patient transfuse another 2 units of packed red blood cells 03/10/2017.  Hemoglobin currently at 9.9.  Patient has been seen in consultation by GI who recommend outpatient follow-up unless patient has active GI bleed or transfusion dependent anemia.  Continue PPI twice daily.  GI cocktail as needed.  Outpatient follow-up with gastroenterology.  7.  History of tobacco abuse Tobacco cessation.  Continue Spiriva, Dulera.  Nebs as needed.  Nicotine patch.   8.  Hypomagnesemia Replete.  Keep magnesium greater than 2.  9.  Upper abdominal pain/chest pain Likely GI etiology concern for reflux disease.  Patient had my examination denied any further upper abdominal/chest discomfort.  Will check a EKG.  Cycle 1 set of troponin.  GI cocktail as needed.     DVT prophylaxis: SCDs/ .  Aspirin per orthopedics. Code Status: Full Family Communication: Updated patient and brother at bedside. Disposition Plan: Likely skilled nursing facility if hemoglobin remained stable hopefully in  the next 24-48 hours.    Consultants:   Orthopedics: Dr. Aline Brochure 03/07/2017  Gastroenterology: Dr. Oneida Alar 03/11/2017  Procedures:   Chest x-ray 03/06/2017  Plain films of the right hip and pelvis  03/06/2017  Plain films of the right wrist 03/06/2017  2 units packed red blood cells transfused 03/07/2017  OTIF/open treatment internal fixation  2 units packed red blood cells to be transfused 03/10/2017  Antimicrobials:   None   Subjective: Patient sitting up in bed, just eating lunch.  Denies any shortness of breath.  Complained of some upper abdominal discomfort.  No nausea or emesis.  No overt bleeding.  Objective: Vitals:   03/10/17 2107 03/11/17 0551 03/11/17 0751 03/11/17 1035  BP: 139/66 130/75  127/62  Pulse: 83 81  78  Resp: 18 18  18   Temp: 98.5 F (36.9 C) 98.4 F (36.9 C)  98 F (36.7 C)  TempSrc: Oral Oral  Oral  SpO2: 95% 97% 98% 96%  Weight:  63.6 kg (140 lb 3.4 oz)    Height:        Intake/Output Summary (Last 24 hours) at 03/11/2017 1220 Last data filed at 03/11/2017 1032 Gross per 24 hour  Intake 1356 ml  Output 700 ml  Net 656 ml   Filed Weights   03/09/17 0504 03/10/17 0500 03/11/17 0551  Weight: 61.3 kg (135 lb 2.3 oz) 62.3 kg (137 lb 5.6 oz) 63.6 kg (140 lb 3.4 oz)    Examination:  General exam: Sitting up in bed Respiratory system: Lungs clear to auscultation bilaterally.  No crackles, no rhonchi, no wheezes.   Cardiovascular system: Regular rate rhythm no murmurs rubs or gallops.  No lower extremity edema.  Gastrointestinal system: Abdomen is tender, nondistended, soft, positive bowel sounds.  No hepatosplenomegaly.   Central nervous system: Alert and oriented. No focal neurological deficits. Extremities: No clubbing cyanosis or edema.  Right wrist in the splint.  Right hip with some tenderness to palpation.  Skin: No rashes, lesions or ulcers Psychiatry: Judgement and insight appear normal. Mood & affect appropriate.     Data Reviewed: I have personally reviewed following labs and imaging studies  CBC: Recent Labs  Lab 03/06/17 1228 03/07/17 0655 03/08/17 0726 03/09/17 0606 03/10/17 0504 03/11/17 0615  WBC 8.7 5.4 6.2 6.5  6.8 7.7  NEUTROABS 6.4  --   --   --   --   --   HGB 11.0* 8.2* 8.8* 7.7* 6.7* 9.9*  HCT 32.2* 24.3* 25.5* 22.3* 20.3* 29.7*  MCV 93.3 92.7 88.5 89.6 91.0 84.1  PLT 161 121* 92* 87* 100* 161*   Basic Metabolic Panel: Recent Labs  Lab 03/07/17 0655 03/08/17 0726 03/09/17 0606 03/10/17 0504 03/11/17 0615  NA 125* 127* 132* 132* 129*  K 4.0 3.9 3.6 3.5 3.8  CL 92* 96* 100* 96* 93*  CO2 26 24 26 29 28   GLUCOSE 94 92 103* 93 88  BUN 12 9 9 10 15   CREATININE 0.52 0.38* 0.35* 0.31* 0.37*  CALCIUM 8.7* 8.3* 8.7* 8.8* 8.9  MG 1.1* 1.1* 1.4* 1.5*  --    GFR: Estimated Creatinine Clearance: 67.4 mL/min (A) (by C-G formula based on SCr of 0.37 mg/dL (L)). Liver Function Tests: Recent Labs  Lab 03/06/17 1228 03/07/17 0655  AST 19 17  ALT 12* 11*  ALKPHOS 62 51  BILITOT 0.3 0.5  PROT 6.1* 5.2*  ALBUMIN 3.2* 2.7*   No results for input(s): LIPASE, AMYLASE in the last 168 hours. No results for input(s):  AMMONIA in the last 168 hours. Coagulation Profile: Recent Labs  Lab 03/07/17 0655  INR 1.01   Cardiac Enzymes: No results for input(s): CKTOTAL, CKMB, CKMBINDEX, TROPONINI in the last 168 hours. BNP (last 3 results) No results for input(s): PROBNP in the last 8760 hours. HbA1C: No results for input(s): HGBA1C in the last 72 hours. CBG: No results for input(s): GLUCAP in the last 168 hours. Lipid Profile: No results for input(s): CHOL, HDL, LDLCALC, TRIG, CHOLHDL, LDLDIRECT in the last 72 hours. Thyroid Function Tests: No results for input(s): TSH, T4TOTAL, FREET4, T3FREE, THYROIDAB in the last 72 hours. Anemia Panel: No results for input(s): VITAMINB12, FOLATE, FERRITIN, TIBC, IRON, RETICCTPCT in the last 72 hours. Sepsis Labs: No results for input(s): PROCALCITON, LATICACIDVEN in the last 168 hours.  Recent Results (from the past 240 hour(s))  Culture, Urine     Status: Abnormal   Collection Time: 03/06/17  5:00 PM  Result Value Ref Range Status   Specimen  Description   Final    URINE, CLEAN CATCH Performed at [facility], [address], [place], [place] [address]    Special Requests   Final    NONE Performed at [facility], [address], [place], [place] [address]    Culture (A)  Final    <10,000 COLONIES/mL INSIGNIFICANT GROWTH Performed at [facility] [address], [place], [place] [address]    Report Status 03/08/2017 FINAL  Final  Surgical PCR screen     Status: None   Collection Time: 03/06/17  5:00 PM  Result Value Ref Range Status   MRSA, PCR NEGATIVE NEGATIVE Final   Staphylococcus aureus NEGATIVE NEGATIVE Final    Comment: (NOTE) The Xpert SA Assay (FDA approved for NASAL specimens in patients 64 years of age and older), is one component of a comprehensive surveillance program. It is not intended to diagnose infection nor to guide or monitor treatment. Performed at [facility], [address] [place],  [address]          Radiology Studies: No results found.      Scheduled Meds: . aspirin EC  325 mg Oral Q breakfast  . benztropine  1 mg Oral BID  . clonazePAM  0.5 mg Oral QHS  . divalproex  1,500 mg Oral QHS  . feeding supplement (ENSURE ENLIVE)  237 mL Oral BID BM  . methimazole  10 mg Oral Daily  . mometasone-formoterol  2 puff Inhalation BID  . nicotine  14 mg Transdermal Daily  . pantoprazole  40 mg Oral BID AC  . risperidone  4 mg Oral QHS  . [START ON 03/13/2017] risperiDONE microspheres  50 mg Intramuscular Q14 Days  . senna-docusate  1 tablet Oral BID  . sodium chloride flush  3 mL Intravenous Q12H  . tiotropium  18 mcg Inhalation Daily  . traMADol  50 mg Oral Q6H  . traZODone  200 mg Oral QHS  . vitamin B-12  1,000 mcg Oral Daily   Continuous Infusions: . methocarbamol (ROBAXIN)  IV       LOS: 5 days    Time spent: 35 minutes    Irine Seal, MD [facility] Pager [phone]  If 7PM-7AM, please contact night-coverage [email or web address] Password  [facility] 03/11/2017, 12:20 PM

## 2017-03-12 ENCOUNTER — Telehealth: Payer: Self-pay | Admitting: Gastroenterology

## 2017-03-12 ENCOUNTER — Inpatient Hospital Stay (HOSPITAL_COMMUNITY): Payer: Medicaid Other

## 2017-03-12 ENCOUNTER — Encounter: Payer: Self-pay | Admitting: Gastroenterology

## 2017-03-12 DIAGNOSIS — I82401 Acute embolism and thrombosis of unspecified deep veins of right lower extremity: Secondary | ICD-10-CM

## 2017-03-12 LAB — BASIC METABOLIC PANEL
Anion gap: 8 (ref 5–15)
BUN: 14 mg/dL (ref 6–20)
CHLORIDE: 94 mmol/L — AB (ref 101–111)
CO2: 29 mmol/L (ref 22–32)
CREATININE: 0.37 mg/dL — AB (ref 0.44–1.00)
Calcium: 8.9 mg/dL (ref 8.9–10.3)
GFR calc Af Amer: >60 mL/min (ref >60)
GFR calc non Af Amer: >60 mL/min (ref >60)
GLUCOSE: 84 mg/dL (ref 65–99)
Potassium: 3.4 mmol/L — ABNORMAL LOW (ref 3.5–5.1)
SODIUM: 131 mmol/L — AB (ref 135–145)

## 2017-03-12 LAB — CBC
HCT: 30 % — ABNORMAL LOW (ref 36.0–46.0)
HEMOGLOBIN: 10.1 g/dL — AB (ref 12.0–15.0)
MCH: 29 pg (ref 26.0–34.0)
MCHC: 33.7 g/dL (ref 30.0–36.0)
MCV: 86.2 fL (ref 78.0–100.0)
PLATELETS: 161 10*3/uL (ref 150–400)
RBC: 3.48 MIL/uL — ABNORMAL LOW (ref 3.87–5.11)
RDW: 20.2 % — ABNORMAL HIGH (ref 11.5–15.5)
WBC: 6.8 10*3/uL (ref 4.0–10.5)

## 2017-03-12 LAB — MAGNESIUM: Magnesium: 1.2 mg/dL — ABNORMAL LOW (ref 1.7–2.4)

## 2017-03-12 MED ORDER — POTASSIUM CHLORIDE CRYS ER 20 MEQ PO TBCR
40.0000 meq | EXTENDED_RELEASE_TABLET | Freq: Once | ORAL | Status: AC
  Administered 2017-03-12: 40 meq via ORAL
  Filled 2017-03-12: qty 2

## 2017-03-12 MED ORDER — MAGNESIUM SULFATE 4 GM/100ML IV SOLN
4.0000 g | Freq: Once | INTRAVENOUS | Status: AC
  Administered 2017-03-12: 4 g via INTRAVENOUS
  Filled 2017-03-12 (×2): qty 100

## 2017-03-12 MED ORDER — CLONAZEPAM 0.5 MG PO TABS: 0.5000 mg | ORAL_TABLET | Freq: Every day | ORAL | 0 refills | Status: DC

## 2017-03-12 MED ORDER — HYDROCODONE-ACETAMINOPHEN 5-325 MG PO TABS: 1.0000 | ORAL_TABLET | Freq: Four times a day (QID) | ORAL | 0 refills | Status: DC | PRN

## 2017-03-12 MED ORDER — TRAMADOL HCL 50 MG PO TABS: 50.0000 mg | ORAL_TABLET | Freq: Four times a day (QID) | ORAL | 0 refills | Status: DC

## 2017-03-12 NOTE — Telephone Encounter (Signed)
Please schedule hospital follow up for anemia (she is a Dr. Nona Dell patient). Needs OV in approximately 2 months.

## 2017-03-12 NOTE — Progress Notes (Signed)
PROGRESS NOTE    Cassidy Smith  NOB:[id number] DOB: [birth year] DOA: 03/06/2017 PCP: [facility]   Brief Narrative:  Patient is a 62 year old female with history of bipolar disorder, anxiety, headache presented to the ED after she tripped and fell on her right side with complaints of right hip pain.  Workup revealed right displaced hip fracture as well as a right nondisplaced radial styloid fracture.  Patient also noted to be hyponatremic.  Patient admitted.  Orthopedics consulted for hip fracture.  s/p surgery 03/07/2017. Patient noted to have swelling in RLE, doppler showed R peroneal DVT. Needs SNF.  Assessment & Plan   Closed right hip intertrochanteric fracture -Secondary to mechanical fall.  Patient denied any syncopal episodes prior to fall. -X-ray showed displaced angulated right intertrochanteric hip fracture -Orthopedics consulted and appreciated, status post treatment internal fixation of the right hip -PT and OT consulted recommending SNF -Weightbearing as tolerated -Patient was placed on aspirin for DVT prophylaxis per orthopedics however patient did develop DVT see below -Patient will need to follow-up with orthopedics as an outpatient -Continue pain control  Right lower extremity swelling/DVT -Patient complained overnight of right lower extremity swelling -RLE doppler: occlusive DVT within the right peroneal vein. No extension of this short-segment distal calf DVT to the more proximal venous system of RLE -Regarding possible anticoagulation given patient has had some GI bleeding.  She is okay with anticoagulation at this time and will follow up with the patient as an outpatient -Given that patient just underwent surgery, will start patient on Eliquis, continue to monitor hemoglobin  Hyponatremia -Likely hypovolemic hyponatremia -Patient had appeared clinically dehydrated on admission and was also noted to be on antipsychotic medications which could  contribute to her hyponatremia  -urine sodium 24, urine creatinine 145.4, FENa 0.07 -TSH 0.664 (WNL) -Sodium today 131 (improved) -continue to monitor BMP  Bipolar disorder/anxiety/depression -Patient appears to be stable at this time, continue Cogentin, Klonopin, Depakote, Risperdal  Hyperthyroidism -TSH 0.664, continue Tapazole  Right radial styloid fracture -Currently in splint, continue outpatient follow-up with orthopedics and pain control  Anemia -possibly secondary to surgery vs dilutional component  -No overt bleeding noted -Hemoglobin dropped to 6.7 from 7.7, was 11 on admission -FOBT pending  -patient denies any overt bleeding -Transfused 2 units PRBC on 03/07/2017 as well as 2 units on 03/10/2017 -Hemoglobin currently 10.1 -Continue PPI twice daily -Gastroenterology consulted and appreciated, patient to follow-up with him as an outpatient -discussed with GI the need for anticoagulation, agreed and recommended continuing PPI -will continue to monitor CBC closely   History of tobacco abuse -Continue nicotine patch -Smoking cessation discussed  Hypomagnesemia -magnesium 1.2, will replace and continue to monitor   Hypokalemia -potassium 3.4, will replace and continue to monitor   Upper abdominal/chest pain -No longer complaining of chest pain at this time, concern for reflux.  As above patient currently on PPI -EKG obtained on 03/11/2017, unremarkable.  Troponin also obtained which was unremarkable -Continue GI cocktail as needed  DVT Prophylaxis  SCDs --> will start Eliquis  Code Status: Full  Family Communication: None at bedside  Disposition Plan: Admitted. Pending SNF  Consultants Orthopedic surgery, Dr. Aline Brochure Gastroenterology, Dr. Oneida Alar  Procedures  OTIF  Antibiotics   Anti-infectives (From admission, onward)   Start     Dose/Rate Route Frequency Ordered Stop   03/07/17 1900  ceFAZolin (ANCEF) IVPB 2g/100 mL premix     2 g 200 mL/hr over 30  Minutes Intravenous Every 6 hours 03/07/17 1713  03/08/17 0400   03/07/17 1215  ceFAZolin (ANCEF) IVPB 2g/100 mL premix     2 g 200 mL/hr over 30 Minutes Intravenous On call to O.R. 03/07/17 1209 03/07/17 1329      Subjective:   Cassidy Smith seen and examined today.  Patient has no complaints this morning.  Denies any current chest pain, shortness of breath, abdominal pain, nausea or vomiting, diarrhea constipation. Did complain of right leg swelling overnight.  Objective:   Vitals:   03/11/17 2027 03/11/17 2109 03/12/17 0415 03/12/17 0841  BP: (!) 166/74  116/60   Pulse: 76  80   Resp: 18  16   Temp: 97.6 F (36.4 C)  98.4 F (36.9 C)   TempSrc: Oral  Oral   SpO2: 96% 91% 96% (!) 87%  Weight:   65.6 kg (144 lb 10 oz)   Height:        Intake/Output Summary (Last 24 hours) at 03/12/2017 1304 Last data filed at 03/12/2017 0600 Gross per 24 hour  Intake 600 ml  Output 1350 ml  Net -750 ml   Filed Weights   03/10/17 0500 03/11/17 0551 03/12/17 0415  Weight: 62.3 kg (137 lb 5.6 oz) 63.6 kg (140 lb 3.4 oz) 65.6 kg (144 lb 10 oz)    Exam  General: Well developed, well nourished, NAD, appears stated age  HEENT: NCAT, mucous membranes moist.   Neck: Supple  Cardiovascular: S1 S2 auscultated, no rubs, murmurs or gallops. Regular rate and rhythm.  Respiratory: Clear to auscultation bilaterally with equal chest rise  Abdomen: Soft, nontender, nondistended, + bowel sounds  Extremities: warm dry without cyanosis clubbing. RLE edema, right wrist in splint  Neuro: AAOx3, nonfocal  Psych: Normal affect and demeanor with intact judgement and insight   Data Reviewed: I have personally reviewed following labs and imaging studies  CBC: Recent Labs  Lab 03/06/17 1228  03/08/17 0726 03/09/17 0606 03/10/17 0504 03/11/17 0615 03/12/17 0409  WBC 8.7   < > 6.2 6.5 6.8 7.7 6.8  NEUTROABS 6.4  --   --   --   --   --   --   HGB 11.0*   < > 8.8* 7.7* 6.7* 9.9* 10.1*  HCT  32.2*   < > 25.5* 22.3* 20.3* 29.7* 30.0*  MCV 93.3   < > 88.5 89.6 91.0 84.1 86.2  PLT 161   < > 92* 87* 100* 131* 161   < > = values in this interval not displayed.   Basic Metabolic Panel: Recent Labs  Lab 03/07/17 0655 03/08/17 0726 03/09/17 0606 03/10/17 0504 03/11/17 0615 03/12/17 0409 03/12/17 0757  NA 125* 127* 132* 132* 129* 131*  --   K 4.0 3.9 3.6 3.5 3.8 3.4*  --   CL 92* 96* 100* 96* 93* 94*  --   CO2 26 24 26 29 28 29   --   GLUCOSE 94 92 103* 93 88 84  --   BUN 12 9 9 10 15 14   --   CREATININE 0.52 0.38* 0.35* 0.31* 0.37* 0.37*  --   CALCIUM 8.7* 8.3* 8.7* 8.8* 8.9 8.9  --   MG 1.1* 1.1* 1.4* 1.5*  --   --  1.2*   GFR: Estimated Creatinine Clearance: 67.4 mL/min (A) (by C-G formula based on SCr of 0.37 mg/dL (L)). Liver Function Tests: Recent Labs  Lab 03/06/17 1228 03/07/17 0655  AST 19 17  ALT 12* 11*  ALKPHOS 62 51  BILITOT 0.3 0.5  PROT 6.1*  5.2*  ALBUMIN 3.2* 2.7*   No results for input(s): LIPASE, AMYLASE in the last 168 hours. No results for input(s): AMMONIA in the last 168 hours. Coagulation Profile: Recent Labs  Lab 03/07/17 0655  INR 1.01   Cardiac Enzymes: Recent Labs  Lab 03/11/17 1238  TROPONINI <0.03   BNP (last 3 results) No results for input(s): PROBNP in the last 8760 hours. HbA1C: No results for input(s): HGBA1C in the last 72 hours. CBG: No results for input(s): GLUCAP in the last 168 hours. Lipid Profile: No results for input(s): CHOL, HDL, LDLCALC, TRIG, CHOLHDL, LDLDIRECT in the last 72 hours. Thyroid Function Tests: No results for input(s): TSH, T4TOTAL, FREET4, T3FREE, THYROIDAB in the last 72 hours. Anemia Panel: No results for input(s): VITAMINB12, FOLATE, FERRITIN, TIBC, IRON, RETICCTPCT in the last 72 hours. Urine analysis:    Component Value Date/Time   COLORURINE YELLOW 03/06/2017 1700   APPEARANCEUR CLEAR 03/06/2017 1700   LABSPEC 1.021 03/06/2017 1700   PHURINE 5.0 03/06/2017 1700   GLUCOSEU  NEGATIVE 03/06/2017 1700   HGBUR NEGATIVE 03/06/2017 1700   BILIRUBINUR NEGATIVE 03/06/2017 1700   KETONESUR 5 (A) 03/06/2017 1700   PROTEINUR NEGATIVE 03/06/2017 1700   UROBILINOGEN 0.2 05/12/2013 0210   NITRITE NEGATIVE 03/06/2017 1700   LEUKOCYTESUR TRACE (A) 03/06/2017 1700   Sepsis Labs: @LABRCNTIP (procalcitonin:4,lacticidven:4)  ) Recent Results (from the past 240 hour(s))  Culture, Urine     Status: Abnormal   Collection Time: 03/06/17  5:00 PM  Result Value Ref Range Status   Specimen Description   Final    URINE, CLEAN CATCH Performed at [facility], [address] [place], [place] [address]    Special Requests   Final    NONE Performed at [facility], [address], [place], [place] [address]    Culture (A)  Final    <10,000 COLONIES/mL INSIGNIFICANT GROWTH Performed at [facility], [place] [address], [place], [place] [address]    Report Status 03/08/2017 FINAL  Final  Surgical PCR screen     Status: None   Collection Time: 03/06/17  5:00 PM  Result Value Ref Range Status   MRSA, PCR NEGATIVE NEGATIVE Final   Staphylococcus aureus NEGATIVE NEGATIVE Final    Comment: (NOTE) The Xpert SA Assay (FDA approved for NASAL specimens in patients 45 years of age and older), is one component of a comprehensive surveillance program. It is not intended to diagnose infection nor to guide or monitor treatment. Performed at [facility], [address], [place], [place] [address]       Radiology Studies: US Venous Img Lower Unilateral Right  Result Date: 03/12/2017 CLINICAL DATA:  Right lower extremity edema. History of fall 1 week ago. History smoking. Evaluate for DVT. EXAM: RIGHT LOWER EXTREMITY VENOUS DOPPLER ULTRASOUND TECHNIQUE: Gray-scale sonography with graded compression, as well as color Doppler and duplex ultrasound were performed to evaluate the lower extremity deep venous systems from the level of the common femoral vein and including the common femoral,  femoral, profunda femoral, popliteal and calf veins including the posterior tibial, peroneal and gastrocnemius veins when visible. The superficial great saphenous vein was also interrogated. Spectral Doppler was utilized to evaluate flow at rest and with distal augmentation maneuvers in the common femoral, femoral and popliteal veins. COMPARISON:  None. FINDINGS: Contralateral Common Femoral Vein: Respiratory phasicity is normal and symmetric with the symptomatic side. No evidence of thrombus. Normal compressibility. Common Femoral Vein: No evidence of thrombus. Normal compressibility, respiratory phasicity and  response to augmentation. Saphenofemoral Junction: No evidence of thrombus. Normal compressibility and flow on color Doppler imaging. Profunda Femoral Vein: No evidence of thrombus. Normal compressibility and flow on color Doppler imaging. Femoral Vein: No evidence of thrombus. Normal compressibility, respiratory phasicity and response to augmentation. Popliteal Vein: No evidence of thrombus. Normal compressibility, respiratory phasicity and response to augmentation. Calf Veins: There is hypoechoic slightly expansile occlusive thrombus within the right peroneal vein (images 35 through 37). The right anterior and posterior tibial veins appear patent where imaged. Superficial Great Saphenous Vein: No evidence of thrombus. Normal compressibility. Venous Reflux:  None. Other Findings:  None. IMPRESSION: Examination is positive for occlusive DVT within the right peroneal vein. There is no extension of this short-segment distal calf DVT to the more proximal venous system of the right lower extremity. Electronically Signed   By: Sandi Mariscal M.D.   On: 03/12/2017 10:57     Scheduled Meds: . aspirin EC  325 mg Oral Q breakfast  . benztropine  1 mg Oral BID  . clonazePAM  0.5 mg Oral QHS  . divalproex  1,500 mg Oral QHS  . feeding supplement (ENSURE ENLIVE)  237 mL Oral BID BM  . methimazole  10 mg Oral Daily   . mometasone-formoterol  2 puff Inhalation BID  . nicotine  14 mg Transdermal Daily  . pantoprazole  40 mg Oral BID AC  . risperidone  4 mg Oral QHS  . [START ON 03/13/2017] risperiDONE microspheres  50 mg Intramuscular Q14 Days  . senna-docusate  1 tablet Oral BID  . sodium chloride flush  3 mL Intravenous Q12H  . tiotropium  18 mcg Inhalation Daily  . traMADol  50 mg Oral Q6H  . traZODone  200 mg Oral QHS  . vitamin B-12  1,000 mcg Oral Daily   Continuous Infusions: . magnesium sulfate 1 - 4 g bolus IVPB 4 g (03/12/17 1153)  . methocarbamol (ROBAXIN)  IV       LOS: 6 days   Time Spent in minutes   45 minutes  Lancelot Alyea D.O. on 03/12/2017 at 1:04 PM  Between 7am to 7pm - Pager - [phone]  After 7pm go to [email or web address] - password [email or web address]  And look for the night coverage person covering for me after hours  [facility] Office  [phone]

## 2017-03-12 NOTE — Telephone Encounter (Signed)
APPT MADE AND LETTER SENT  °

## 2017-03-12 NOTE — Progress Notes (Addendum)
Subjective:  No complaints today. No n/v. No overt gi bleeding. Had a large BM yesterday afternoon.   Objective: Vital signs in last 24 hours: Temp:  [97.6 F (36.4 C)-98.4 F (36.9 C)] 98.4 F (36.9 C) (02/27 0415) Pulse Rate:  [76-80] 80 (02/27 0415) Resp:  [16-18] 16 (02/27 0415) BP: (113-166)/(60-74) 116/60 (02/27 0415) SpO2:  [87 %-98 %] 87 % (02/27 0841) Weight:  [144 lb 10 oz (65.6 kg)] 144 lb 10 oz (65.6 kg) (02/27 0415) Last BM Date: 03/10/17 General:   Alert,  Well-developed, well-nourished, pleasant and cooperative in NAD Head:  Normocephalic and atraumatic. Eyes:  Sclera clear, no icterus.  Abdomen:  Soft, nontender and nondistended. Normal bowel sounds.   Neurologic:  Alert and  oriented x4;  grossly normal neurologically. Skin:  Intact without significant lesions or rashes. Psych:  Alert and cooperative. Normal mood and affect.  Intake/Output from previous day: 02/26 0701 - 02/27 0700 In: 8099 [P.O.:1320; I.V.:3] Out: 1350 [Urine:1350] Intake/Output this shift: No intake/output data recorded.  Lab Results: CBC Recent Labs    03/10/17 0504 03/11/17 0615 03/12/17 0409  WBC 6.8 7.7 6.8  HGB 6.7* 9.9* 10.1*  HCT 20.3* 29.7* 30.0*  MCV 91.0 84.1 86.2  PLT 100* 131* 161   BMET Recent Labs    03/10/17 0504 03/11/17 0615 03/12/17 0409  NA 132* 129* 131*  K 3.5 3.8 3.4*  CL 96* 93* 94*  CO2 29 28 29   GLUCOSE 93 88 84  BUN 10 15 14   CREATININE 0.31* 0.37* 0.37*  CALCIUM 8.8* 8.9 8.9   LFTs No results for input(s): BILITOT, BILIDIR, IBILI, ALKPHOS, AST, ALT, PROT, ALBUMIN in the last 72 hours. No results for input(s): LIPASE in the last 72 hours. PT/INR No results for input(s): LABPROT, INR in the last 72 hours.    Imaging Studies: Dg Chest 1 View  Result Date: 03/06/2017 CLINICAL DATA:  Fall today with right hip fracture. EXAM: CHEST 1 VIEW COMPARISON:  01/26/2016 FINDINGS: The cardiomediastinal silhouette is within normal limits. The lungs  remain hyperinflated. There is minimal scarring in the left lung base. No confluent airspace opacity, edema, pleural effusion, or pneumothorax is identified. No acute osseous abnormality is seen. IMPRESSION: No active disease. Electronically Signed   By: Logan Bores M.D.   On: 03/06/2017 13:15   Dg Wrist Complete Right  Result Date: 03/06/2017 CLINICAL DATA:  Right wrist pain due to a fall today. Initial encounter. EXAM: RIGHT WRIST - COMPLETE 3+ VIEW COMPARISON:  None. FINDINGS: Subtle cortical irregularity radius at the styloid extending to the articular surface is seen in the distal consistent with a nondisplaced fracture. No other acute bony or joint abnormality is identified. IMPRESSION: Nondisplaced radial styloid fracture. Electronically Signed   By: Inge Rise M.D.   On: 03/06/2017 13:11   Dg Hip Operative Unilat With Pelvis Right  Result Date: 03/07/2017 CLINICAL DATA:  Right hip fracture. EXAM: OPERATIVE right HIP (WITH PELVIS IF PERFORMED) 2 VIEWS TECHNIQUE: Fluoroscopic spot image(s) were submitted for interpretation post-operatively. COMPARISON:  03/06/2017 FINDINGS: Multiple C-arm images demonstrate the patient undergoing open reduction and internal fixation of the intertrochanteric fracture of the proximal right femur. Alignment and position of the major fracture fragments is near anatomic. Gama intramedullary nail fixation. IMPRESSION: Open reduction internal fixation of right proximal femur fracture. Electronically Signed   By: Lorriane Shire M.D.   On: 03/07/2017 15:15   Dg Hip Unilat W Or Wo Pelvis 2-3 Views Right  Result Date: 03/06/2017 CLINICAL  DATA:  Fall.  Right hip pain and deformity. EXAM: DG HIP (WITH OR WITHOUT PELVIS) 2-3V RIGHT COMPARISON:  11/24/2015. FINDINGS: Diffuse osteopenia degenerative change. Right intertrochanteric hip fracture with angulation deformity noted. Fractures fragments are displaced. IMPRESSION: Displaced angulated right intertrochanteric hip  fracture. Electronically Signed   By: Marcello Moores  Register   On: 03/06/2017 13:09  [2 weeks]   Assessment: 62 year old pleasant female admitted after fall and found to have right hip fracture, s/p ORIF on 2/22. Admitting Hgb 11 and dropped to 8 range prior to procedure, with 2 units PRBCs given pre-operatively. Following procedure, her Hgb was stable in the 8 range but then has drifted over the past 24-48 hours to 6.7, receiving an additional 2 units PRBCs. Hgb now improved to 101.1. Remains without any overt GI bleeding. Unknown hemoccult status. No indication for endoscopic evaluation unless active GI bleeding or continues with transfusion-dependent anemia.  As of note, abdominal exam with mild distension yesterday with somewhat tympanic bowel sounds, suspect ileus. Today her abdominal exam is improved. Bowel sounds are normal.   Thrombocytopenia: chronic. Unknown etiology, possibly med-related. No known liver disease or abdominal imaging on file. improve plated count today.   Plan: 1. Hemoccult stool.  2. Monitor for overt GI bleeding. Consider periodic Hgb as outpatient until GI work up completed.  3. PPI BID.  4. Plan for outpatient follow up to discuss possible EGD/colonoscopy once we recovers from surgery. Will sign off for now. Call with questions.   Laureen Ochs. Bernarda Caffey [facility] [phone] 2/27/201910:31 AM     LOS: 6 days    Addendum: patient has DVT. Discuss with Dr. Ree Kida and Dr. Gala Romney. From a GI standpoint she can be anticoagulated but would continue PPI BID for gi prophylaxis.   Laureen Ochs. Bernarda Caffey [facility] [phone] 2/27/201912:45 PM

## 2017-03-12 NOTE — Progress Notes (Signed)
SATURATION QUALIFICATIONS: (This note is used to comply with regulatory documentation for home oxygen)  Patient Saturations on Room Air at Rest = 92%  Patient Saturations on Room Air while Ambulating = 89% Could only walk 10 feet  Patient Saturations on 2 Liters of oxygen while Ambulating = 97%  Please briefly explain why patient needs home oxygen:

## 2017-03-12 NOTE — Clinical Social Work Note (Signed)
LCSW following. No bed offers received from initial referrals. Discussed with pt that with her Medicaid payer source, her options for SNF are going to be more limited. She agreed to additional referrals which LCSW will facilitate. MD states pt is not medically ready for dc as she has a DVT that needs to be addressed. Have been unable to reach pt's brother to update and unable to leave a voice message. Will follow up later today or tomorrow.

## 2017-03-12 NOTE — Telephone Encounter (Signed)
Please make arrangements for patient to have cbc done in 2 weeks.

## 2017-03-12 NOTE — Progress Notes (Signed)
Upon assessment I noticed patients right leg was very swollen and warm on her right hip and swelling went down to her knee. I asked the RN from yesterday night shift to assess patient to see if her leg looked more edematous than previous shift, RN stated it did look more red and swollen than previous night and it was a significant increase from yesterday. I paged MD on call about situation. MD  D. Olevia Bowens came to floor and looked at patients right hip.

## 2017-03-12 NOTE — Progress Notes (Signed)
Physical Therapy Treatment Patient Details Name: Cassidy Smith MRN: [id number] DOB: 19-Apr-1955 Today's Date: 03/12/2017    History of Present Illness History 62 year old female with history of walking with a cane has a tremor tripped over her right foot landed on her right side complains of right hip pain starting on February 21.  Her initial pain was severe it was located over the right hip is been there for 1 day is associated with inability to walk but no radiation.  She also has some right wrist pain which x-rays show avulsion fracture ulnar styloid    PT Comments    Patient presents seated at bedside after being assisted to bathroom and back to bedside with nursing assisting and present in room.  Patient demonstrates slow labored cadence with difficulty pushing RW forward requiring occasional assistance to advance RW, limited secondary to c/o fatigue.  Patient tolerated sitting up in chair after therapy - nursing staff notified.   Follow Up Recommendations  SNF     Equipment Recommendations  None recommended by PT    Recommendations for Other Services       Precautions / Restrictions Precautions Precautions: Fall Precaution Comments: Right avulsion fracture ulnar styloid. No orders regarding precautions in chart. Limit weightbearing as patient is in a wrist brace. Restrictions Weight Bearing Restrictions: Yes RLE Weight Bearing: Weight bearing as tolerated    Mobility  Bed Mobility               General bed mobility comments: Patient presents seated at bedside (assisted by nursing staff)  Transfers Overall transfer level: Needs assistance Equipment used: Rolling walker (2 wheeled) Transfers: Sit to/from [facility] Sit to Stand: Min assist Stand pivot transfers: Min assist       General transfer comment: has difficulty turning with RW  Ambulation/Gait Ambulation/Gait assistance: Min assist Ambulation Distance (Feet): 18 Feet Assistive  device: Rolling walker (2 wheeled) Gait Pattern/deviations: Decreased step length - right;Decreased stance time - right;Decreased stride length   Gait velocity interpretation: Below normal speed for age/gender General Gait Details: Patient demonstrates slow labored cadence requiring help to move RW forward, limited secondary to c/o fatigue   Stairs            Wheelchair Mobility    Modified Rankin (Stroke Patients Only)       Balance Overall balance assessment: Needs assistance Sitting-balance support: Feet supported;No upper extremity supported       Standing balance support: Bilateral upper extremity supported;During functional activity Standing balance-Leahy Scale: Fair                              Cognition Arousal/Alertness: Awake/alert   Overall Cognitive Status: Within Functional Limits for tasks assessed                                        Exercises General Exercises - Lower Extremity Toe Raises: Seated;AROM;Strengthening;Both;10 reps Heel Raises: Seated;AROM;Strengthening;10 reps;Both    General Comments        Pertinent Vitals/Pain Pain Assessment: Faces Faces Pain Scale: Hurts little more Pain Location: right hip Pain Descriptors / Indicators: Throbbing;Sore;Sharp Pain Intervention(s): Limited activity within patient's tolerance;Monitored during session    Home Living                      Prior Function  PT Goals (current goals can now be found in the care plan section) Acute Rehab PT Goals Patient Stated Goal: return home PT Goal Formulation: With patient Time For Goal Achievement: 03/24/17 Potential to Achieve Goals: Good Progress towards PT goals: Progressing toward goals    Frequency    7X/week      PT Plan Current plan remains appropriate    Co-evaluation              AM-PAC PT "6 Clicks" Daily Activity  Outcome Measure  Difficulty turning over in bed (including  adjusting bedclothes, sheets and blankets)?: A Little Difficulty moving from lying on back to sitting on the side of the bed? : A Lot Difficulty sitting down on and standing up from a chair with arms (e.g., wheelchair, bedside commode, etc,.)?: A Lot Help needed moving to and from a bed to chair (including a wheelchair)?: A Lot Help needed walking in hospital room?: A Lot Help needed climbing 3-5 steps with a railing? : Total 6 Click Score: 12    End of Session Equipment Utilized During Treatment: Gait belt Activity Tolerance: Patient tolerated treatment well;Patient limited by fatigue Patient left: in chair;with call bell/phone within reach Nurse Communication: Mobility status PT Visit Diagnosis: Muscle weakness (generalized) (M62.81);History of falling (Z91.81);Difficulty in walking, not elsewhere classified (R26.2);Unsteadiness on feet (R26.81);Other abnormalities of gait and mobility (R26.89)     Time: 6599-3570 PT Time Calculation (min) (ACUTE ONLY): 21 min  Charges:  $Therapeutic Activity: 8-22 mins                    G Codes:       3:49 PM, 2017-03-23 Lonell Grandchild, MPT Physical Therapist with [facility] [phone] office [phone] mobile phone

## 2017-03-12 NOTE — Progress Notes (Signed)
Night shift floor coverage note  The nursing staff has noticed significantly increased edema on the patient's right thigh area.  She started ambulating yesterday with PT, so this could account for some of edema seen.  However, since the staff describes a marked difference from the previous day, will order a Doppler ultrasound of the lower extremity to rule out DVT.  Tennis Must, MD

## 2017-03-12 NOTE — Progress Notes (Signed)
PT Cancellation Note  Patient Details Name: Cassidy Smith MRN: [id number] DOB: [birth year]   Cancelled Treatment:     Held PT session following discussion with RN.  Plans on [place] to resolve DVT.  [address] LPTA; [place]  Aldona Lento 03/12/2017, 8:59 AM

## 2017-03-13 ENCOUNTER — Other Ambulatory Visit: Payer: Self-pay

## 2017-03-13 ENCOUNTER — Inpatient Hospital Stay (HOSPITAL_COMMUNITY): Payer: Medicaid Other

## 2017-03-13 DIAGNOSIS — R600 Localized edema: Secondary | ICD-10-CM

## 2017-03-13 DIAGNOSIS — S72001A Fracture of unspecified part of neck of right femur, initial encounter for closed fracture: Secondary | ICD-10-CM

## 2017-03-13 DIAGNOSIS — F319 Bipolar disorder, unspecified: Secondary | ICD-10-CM

## 2017-03-13 DIAGNOSIS — Z09 Encounter for follow-up examination after completed treatment for conditions other than malignant neoplasm: Secondary | ICD-10-CM

## 2017-03-13 DIAGNOSIS — S72141A Displaced intertrochanteric fracture of right femur, initial encounter for closed fracture: Principal | ICD-10-CM

## 2017-03-13 DIAGNOSIS — S52514A Nondisplaced fracture of right radial styloid process, initial encounter for closed fracture: Secondary | ICD-10-CM

## 2017-03-13 DIAGNOSIS — D649 Anemia, unspecified: Secondary | ICD-10-CM

## 2017-03-13 DIAGNOSIS — E871 Hypo-osmolality and hyponatremia: Secondary | ICD-10-CM

## 2017-03-13 DIAGNOSIS — Z8659 Personal history of other mental and behavioral disorders: Secondary | ICD-10-CM

## 2017-03-13 DIAGNOSIS — I82409 Acute embolism and thrombosis of unspecified deep veins of unspecified lower extremity: Secondary | ICD-10-CM

## 2017-03-13 DIAGNOSIS — W19XXXA Unspecified fall, initial encounter: Secondary | ICD-10-CM

## 2017-03-13 LAB — CBC
HEMATOCRIT: 30.2 % — AB (ref 36.0–46.0)
HEMOGLOBIN: 9.6 g/dL — AB (ref 12.0–15.0)
MCH: 27.7 pg (ref 26.0–34.0)
MCHC: 31.8 g/dL (ref 30.0–36.0)
MCV: 87.3 fL (ref 78.0–100.0)
Platelets: 212 10*3/uL (ref 150–400)
RBC: 3.46 MIL/uL — ABNORMAL LOW (ref 3.87–5.11)
RDW: 20.3 % — AB (ref 11.5–15.5)
WBC: 7.8 10*3/uL (ref 4.0–10.5)

## 2017-03-13 LAB — BASIC METABOLIC PANEL
Anion gap: 9 (ref 5–15)
BUN: 13 mg/dL (ref 6–20)
CHLORIDE: 92 mmol/L — AB (ref 101–111)
CO2: 29 mmol/L (ref 22–32)
CREATININE: 0.41 mg/dL — AB (ref 0.44–1.00)
Calcium: 9 mg/dL (ref 8.9–10.3)
GFR calc Af Amer: >60 mL/min (ref >60)
GFR calc non Af Amer: >60 mL/min (ref >60)
GLUCOSE: 95 mg/dL (ref 65–99)
Potassium: 3.8 mmol/L (ref 3.5–5.1)
SODIUM: 130 mmol/L — AB (ref 135–145)

## 2017-03-13 LAB — MAGNESIUM: Magnesium: 1.3 mg/dL — ABNORMAL LOW (ref 1.7–2.4)

## 2017-03-13 MED ORDER — MAGNESIUM SULFATE 2 GM/50ML IV SOLN
2.0000 g | Freq: Once | INTRAVENOUS | Status: AC
  Administered 2017-03-13: 2 g via INTRAVENOUS
  Filled 2017-03-13: qty 50

## 2017-03-13 MED ORDER — ASPIRIN EC 81 MG PO TBEC
81.0000 mg | DELAYED_RELEASE_TABLET | Freq: Every day | ORAL | Status: DC
  Administered 2017-03-14: 81 mg via ORAL
  Filled 2017-03-13: qty 1

## 2017-03-13 MED ORDER — FERROUS SULFATE 325 (65 FE) MG PO TABS
325.0000 mg | ORAL_TABLET | Freq: Every day | ORAL | Status: DC
  Administered 2017-03-13 – 2017-03-14 (×2): 325 mg via ORAL
  Filled 2017-03-13 (×2): qty 1

## 2017-03-13 MED ORDER — RIVAROXABAN 15 MG PO TABS
15.0000 mg | ORAL_TABLET | Freq: Two times a day (BID) | ORAL | Status: DC
  Administered 2017-03-13 – 2017-03-14 (×2): 15 mg via ORAL
  Filled 2017-03-13 (×2): qty 1

## 2017-03-13 NOTE — Care Management Note (Signed)
Case Management Note  Patient Details  Name: Cassidy Smith MRN: [id number] Date of Birth: March 26, 1955  If discussed at Long Length of Stay Meetings, dates discussed:  03/13/2017  Additional Comments:  Sherald Barge, RN 03/13/2017, 11:54 AM

## 2017-03-13 NOTE — Progress Notes (Signed)
Subjective: 6 Days Post-Op Procedure(s) (LRB): OPEN TREATMENT INTERNAL FIXATION RIGHT HIP WITH GAMA INTRAMEDULARY NAIL (Right)   Objective: Vital signs in last 24 hours: Temp:  [98.1 F (36.7 C)-98.5 F (36.9 C)] 98.4 F (36.9 C) (02/28 0521) Pulse Rate:  [77-85] 77 (02/28 0521) Resp:  [17-20] 20 (02/27 2100) BP: (118-148)/(63-81) 122/71 (02/28 0521) SpO2:  [91 %-95 %] 95 % (02/28 0521)  Intake/Output from previous day: 02/27 0701 - 02/28 0700 In: 440 [P.O.:440] Out: -  Intake/Output this shift: Total I/O In: 240 [P.O.:240] Out: 2 [Urine:2]  Recent Labs    03/11/17 0615 03/12/17 0409 03/13/17 0642  HGB 9.9* 10.1* 9.6*   Recent Labs    03/12/17 0409 03/13/17 0642  WBC 6.8 7.8  RBC 3.48* 3.46*  HCT 30.0* 30.2*  PLT 161 212   Recent Labs    03/12/17 0409 03/13/17 0642  NA 131* 130*  K 3.4* 3.8  CL 94* 92*  CO2 29 29  BUN 14 13  CREATININE 0.37* 0.41*  GLUCOSE 84 95  CALCIUM 8.9 9.0  Assessment/Plan: 6 Days Post-Op Procedure(s) (LRB): OPEN TREATMENT INTERNAL FIXATION RIGHT HIP WITH GAMA INTRAMEDULARY NAIL (Right)   Arther Abbott 03/13/2017, 11:42 AM

## 2017-03-13 NOTE — Progress Notes (Signed)
PROGRESS NOTE  Cassidy Smith:[id number] DOB: 1955/09/24 DOA: 03/06/2017 PCP: Health, [facility]  HPI/Recap of past 24 hours: Patient is a 62 year old female with history of bipolar disorder, anxiety, headache presented to the ED after she tripped and fell on her right side with complaints of right hip pain. Workup revealed right displaced hip fracture as well as a right nondisplaced radial styloid fracture. Patient also noted to be hyponatremic. Admiited for acute right hip fracture s/p surgery 03/07/2017. Patient noted to have swelling in RLE, doppler showed R peroneal DVT. Needs SNF.  03/13/17: patient seen and examined with her brother at her bedside. States right hip pain is well controlled on current pain management. Spoke with orthopedic surgery who is ok with starting xarelto for DVT treatment. Pharmacy to manage.    Assessment/Plan: Principal Problem:   Closed intertrochanteric fracture of hip, right, initial encounter [facility]) Active Problems:   Hyponatremia   Bipolar disorder ([place])   History of depression   Hx of anxiety disorder   Radial styloid fracture: right   Anemia   Hypomagnesemia   Fall  Right hip intertrochanteric FX POD #6 post repair -mechanical fall -orthopedic surgery following-highly appreciated -Continue PT/OT -continue pain management -fall precaution -PT recommends short term rehab -case worker arranging for placement  RLE DVT  -clinically unable to determined if acute or chronic  -xarelto started today-ok with orthopedic surgery -continue to monitor  Eulvolemic Hyponatremia -most likely multifactorial 2/2 to pain vs others -Na+ 130 form 131 -asymptomatic -repeat chemistry panel in am -control pain  Chronic normocytic anemia -suspect multifactorial 2./2 to iron deficiency vs acute blood loss -hg 11 on admission -lowest 6.7 during this admission -FOBT still pending -hg 9.6 -monitor-repeat cbc am  Hypomagnesemia -mag  1.3 -mag supplement -repeat level am  Hyperthyroidism -well controlled -TSH 0.664 -continue methimazole  Hypokalemia, resolved  Bipolar/anxiety/depresion -stable -continue home meds -risperdal IM and po restarted     Code Status: full  Family Communication: brother at bedside  Disposition Plan: SNF for short term rehab possibly tomorrow 2/29/19 if no events overnight    Consultants:  Orthopedic surgery  Social worker  PT  Procedures:  Left right hip repair  Antimicrobials:  none  DVT prophylaxis:  Xarelto   Objective: Vitals:   03/12/17 1300 03/12/17 1935 03/12/17 2100 03/13/17 0521  BP: 118/63  (!) 148/81 122/71  Pulse: 85  81 77  Resp: 17  20   Temp: 98.5 F (36.9 C)  98.1 F (36.7 C) 98.4 F (36.9 C)  TempSrc: Oral  Oral Oral  SpO2: 95% 91% 92% 95%  Weight:      Height:        Intake/Output Summary (Last 24 hours) at 03/13/2017 1025 Last data filed at 03/13/2017 8315 Gross per 24 hour  Intake 440 ml  Output -  Net 440 ml   Filed Weights   03/10/17 0500 03/11/17 0551 03/12/17 0415  Weight: 62.3 kg (137 lb 5.6 oz) 63.6 kg (140 lb 3.4 oz) 65.6 kg (144 lb 10 oz)    Exam:   General:  62 yo CF WD WN NAD A&O x 3  Cardiovascular: RRR no rubs or gallops  Respiratory: CTA no wheezes or rales  Abdomen: soft NT nD NBS x4   Musculoskeletal: no focal abnormalities. RLE swelling. No erythema or exudates noted   Skin: right hip lateral is covered with surgical dressing  Psychiatry: mood is appropriate for condition and setting    Data Reviewed: CBC: Recent  Labs  Lab 03/06/17 1228  03/09/17 0606 03/10/17 0504 03/11/17 0615 03/12/17 0409 03/13/17 0642  WBC 8.7   < > 6.5 6.8 7.7 6.8 7.8  NEUTROABS 6.4  --   --   --   --   --   --   HGB 11.0*   < > 7.7* 6.7* 9.9* 10.1* 9.6*  HCT 32.2*   < > 22.3* 20.3* 29.7* 30.0* 30.2*  MCV 93.3   < > 89.6 91.0 84.1 86.2 87.3  PLT 161   < > 87* 100* 131* 161 212   < > = values in this interval  not displayed.   Basic Metabolic Panel: Recent Labs  Lab 03/08/17 0726 03/09/17 0606 03/10/17 0504 03/11/17 0615 03/12/17 0409 03/12/17 0757 03/13/17 0642  NA 127* 132* 132* 129* 131*  --  130*  K 3.9 3.6 3.5 3.8 3.4*  --  3.8  CL 96* 100* 96* 93* 94*  --  92*  CO2 24 26 29 28 29   --  29  GLUCOSE 92 103* 93 88 84  --  95  BUN 9 9 10 15 14   --  13  CREATININE 0.38* 0.35* 0.31* 0.37* 0.37*  --  0.41*  CALCIUM 8.3* 8.7* 8.8* 8.9 8.9  --  9.0  MG 1.1* 1.4* 1.5*  --   --  1.2* 1.3*   GFR: Estimated Creatinine Clearance: 67.4 mL/min (A) (by C-G formula based on SCr of 0.41 mg/dL (L)). Liver Function Tests: Recent Labs  Lab 03/06/17 1228 03/07/17 0655  AST 19 17  ALT 12* 11*  ALKPHOS 62 51  BILITOT 0.3 0.5  PROT 6.1* 5.2*  ALBUMIN 3.2* 2.7*   No results for input(s): LIPASE, AMYLASE in the last 168 hours. No results for input(s): AMMONIA in the last 168 hours. Coagulation Profile: Recent Labs  Lab 03/07/17 0655  INR 1.01   Cardiac Enzymes: Recent Labs  Lab 03/11/17 1238  TROPONINI <0.03   BNP (last 3 results) No results for input(s): PROBNP in the last 8760 hours. HbA1C: No results for input(s): HGBA1C in the last 72 hours. CBG: No results for input(s): GLUCAP in the last 168 hours. Lipid Profile: No results for input(s): CHOL, HDL, LDLCALC, TRIG, CHOLHDL, LDLDIRECT in the last 72 hours. Thyroid Function Tests: No results for input(s): TSH, T4TOTAL, FREET4, T3FREE, THYROIDAB in the last 72 hours. Anemia Panel: No results for input(s): VITAMINB12, FOLATE, FERRITIN, TIBC, IRON, RETICCTPCT in the last 72 hours. Urine analysis:    Component Value Date/Time   COLORURINE YELLOW 03/06/2017 1700   APPEARANCEUR CLEAR 03/06/2017 1700   LABSPEC 1.021 03/06/2017 1700   PHURINE 5.0 03/06/2017 1700   GLUCOSEU NEGATIVE 03/06/2017 1700   HGBUR NEGATIVE 03/06/2017 1700   BILIRUBINUR NEGATIVE 03/06/2017 1700   KETONESUR 5 (A) 03/06/2017 1700   PROTEINUR NEGATIVE  03/06/2017 1700   UROBILINOGEN 0.2 05/12/2013 0210   NITRITE NEGATIVE 03/06/2017 1700   LEUKOCYTESUR TRACE (A) 03/06/2017 1700   Sepsis Labs: @LABRCNTIP (procalcitonin:4,lacticidven:4)  ) Recent Results (from the past 240 hour(s))  Culture, Urine     Status: Abnormal   Collection Time: 03/06/17  5:00 PM  Result Value Ref Range Status   Specimen Description   Final    URINE, CLEAN CATCH Performed at [facility], [address], [place], [place] [address]    Special Requests   Final    NONE Performed at [facility], [address] [place], [place] [address]    Culture (A)  Final    <  10,000 COLONIES/mL INSIGNIFICANT GROWTH Performed at [facility], [place] [address], [place], [place] [address]    Report Status 03/08/2017 FINAL  Final  Surgical PCR screen     Status: None   Collection Time: 03/06/17  5:00 PM  Result Value Ref Range Status   MRSA, PCR NEGATIVE NEGATIVE Final   Staphylococcus aureus NEGATIVE NEGATIVE Final    Comment: (NOTE) The Xpert SA Assay (FDA approved for NASAL specimens in patients 60 years of age and older), is one component of a comprehensive surveillance program. It is not intended to diagnose infection nor to guide or monitor treatment. Performed at [facility], [address], [place], [place] [address]       Studies: US Venous Img Lower Unilateral Right  Result Date: 03/12/2017 CLINICAL DATA:  Right lower extremity edema. History of fall 1 week ago. History smoking. Evaluate for DVT. EXAM: RIGHT LOWER EXTREMITY VENOUS DOPPLER ULTRASOUND TECHNIQUE: Gray-scale sonography with graded compression, as well as color Doppler and duplex ultrasound were performed to evaluate the lower extremity deep venous systems from the level of the common femoral vein and including the common femoral, femoral, profunda femoral, popliteal and calf veins including the posterior tibial, peroneal and gastrocnemius veins when visible. The superficial great saphenous vein  was also interrogated. Spectral Doppler was utilized to evaluate flow at rest and with distal augmentation maneuvers in the common femoral, femoral and popliteal veins. COMPARISON:  None. FINDINGS: Contralateral Common Femoral Vein: Respiratory phasicity is normal and symmetric with the symptomatic side. No evidence of thrombus. Normal compressibility. Common Femoral Vein: No evidence of thrombus. Normal compressibility, respiratory phasicity and response to augmentation. Saphenofemoral Junction: No evidence of thrombus. Normal compressibility and flow on color Doppler imaging. Profunda Femoral Vein: No evidence of thrombus. Normal compressibility and flow on color Doppler imaging. Femoral Vein: No evidence of thrombus. Normal compressibility, respiratory phasicity and response to augmentation. Popliteal Vein: No evidence of thrombus. Normal compressibility, respiratory phasicity and response to augmentation. Calf Veins: There is hypoechoic slightly expansile occlusive thrombus within the right peroneal vein (images 35 through 37). The right anterior and posterior tibial veins appear patent where imaged. Superficial Great Saphenous Vein: No evidence of thrombus. Normal compressibility. Venous Reflux:  None. Other Findings:  None. IMPRESSION: Examination is positive for occlusive DVT within the right peroneal vein. There is no extension of this short-segment distal calf DVT to the more proximal venous system of the right lower extremity. Electronically Signed   By: Sandi Mariscal M.D.   On: 03/12/2017 10:57    Scheduled Meds: . aspirin EC  325 mg Oral Q breakfast  . benztropine  1 mg Oral BID  . clonazePAM  0.5 mg Oral QHS  . divalproex  1,500 mg Oral QHS  . feeding supplement (ENSURE ENLIVE)  237 mL Oral BID BM  . methimazole  10 mg Oral Daily  . mometasone-formoterol  2 puff Inhalation BID  . nicotine  14 mg Transdermal Daily  . pantoprazole  40 mg Oral BID AC  . risperidone  4 mg Oral QHS  . risperiDONE  microspheres  50 mg Intramuscular Q14 Days  . senna-docusate  1 tablet Oral BID  . sodium chloride flush  3 mL Intravenous Q12H  . tiotropium  18 mcg Inhalation Daily  . traMADol  50 mg Oral Q6H  . traZODone  200 mg Oral QHS  . vitamin B-12  1,000 mcg Oral Daily    Continuous Infusions: . methocarbamol (ROBAXIN)  IV  LOS: 7 days     Kayleen Memos, MD [facility] Pager [phone]  If 7PM-7AM, please contact night-coverage [email or web address] Password [facility] 03/13/2017, 10:25 AM

## 2017-03-13 NOTE — Progress Notes (Signed)
ANTICOAGULATION CONSULT NOTE - Initial Consult  Pharmacy Consult for Xarelto Indication: DVT  No Known Allergies  Patient Measurements: Height: 5\' 6"  (167.6 cm) Weight: 144 lb 10 oz (65.6 kg) IBW/kg (Calculated) : 59.3  Vital Signs: Temp: 98.4 F (36.9 C) (02/28 0521) Temp Source: Oral (02/28 0521) BP: 122/71 (02/28 0521) Pulse Rate: 77 (02/28 0521)  Labs: Recent Labs    03/11/17 0615 03/11/17 1238 03/12/17 0409 03/13/17 0642  HGB 9.9*  --  10.1* 9.6*  HCT 29.7*  --  30.0* 30.2*  PLT 131*  --  161 212  CREATININE 0.37*  --  0.37* 0.41*  TROPONINI  --  <0.03  --   --     Estimated Creatinine Clearance: 67.4 mL/min (A) (by C-G formula based on SCr of 0.41 mg/dL (L)).   Medical History: Past Medical History:  Diagnosis Date  . Anxiety   . Bipolar 1 disorder ([place])   . Chronic hip pain   . Chronic kidney disease    uti  currently,  hx bladder spasms  . Depression   . Headache(784.0)   . Neuromuscular disorder (H[facility])    shaking of hands   . Stroke [facility])    mini    Medications:  Medications Prior to Admission  Medication Sig Dispense Refill Last Dose  . aspirin EC 81 MG EC tablet Take 1 tablet (81 mg total) by mouth daily.   03/05/2017 at Unknown time  . benztropine (COGENTIN) 1 MG tablet Take 1 mg by mouth 2 (two) times daily.   03/05/2017 at Unknown time  . divalproex (DEPAKOTE ER) 500 MG 24 hr tablet Take 1,500 mg by mouth at bedtime.    03/05/2017 at Unknown time  . methimazole (TAPAZOLE) 10 MG tablet Take 10 mg by mouth daily.   0 03/05/2017 at Unknown time  . Multiple Vitamins-Minerals (CENTRUM SILVER ADULT 50+ PO) Take 1 tablet by mouth every morning.   03/05/2017 at Unknown time  . RISPERDAL CONSTA 50 MG injection Inject 50 mg into the muscle every 14 (fourteen) days.  0 unknown  . risperidone (RISPERDAL) 4 MG tablet Take 4 mg by mouth at bedtime.  0 03/05/2017 at Unknown time  . traZODone (DESYREL) 100 MG tablet Take 200 mg by mouth at bedtime.   03/05/2017 at  Unknown time  . vitamin B-12 (CYANOCOBALAMIN) 1000 MCG tablet Take 1,000 mcg by mouth daily.   03/05/2017 at Unknown time    Assessment: 62 year old female with history of bipolar disorder, anxiety, headache presented to the ED after she tripped and fell on her right side with complaints of swelling in RLE. 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 Venous doppler showed R peroneal DVT. Pharmacy asked to start [place] for DVT.  Goal of Therapy:  Monitor platelets by anticoagulation protocol: Yes   Plan:  xarelto 15mg  po BID with food x 21 days, then 20mg  with evening meal daily Educate on xarelto Monitor for s/s of bleeding  Isac Sarna, BS [place], BCPS Clinical Pharmacist Pager [phone]  03/13/2017,2:26 PM

## 2017-03-13 NOTE — Progress Notes (Signed)
Physical Therapy Treatment Patient Details Name: Cassidy Smith MRN: [id number] DOB: 22-Jun-1955 Today's Date: 03/13/2017    History of Present Illness History 62 year old female with history of walking with a cane has a tremor tripped over her right foot landed on her right side complains of right hip pain starting on February 21.  Her initial pain was severe it was located over the right hip is been there for 1 day is associated with inability to walk but no radiation.  She also has some right wrist pain which x-rays show avulsion fracture ulnar styloid    PT Comments    Pt supine in bed and willing to participate with therapy today.  No reports of pain through session.  Moderate assistance and verbal/tactile cueing to assist with bed mobility and transfers with verbal and tactile cueing for hand placement to assist with activity.  Pt demonstrates decreased stance phase and stride length during gait which increases risk of fall and as well as increased fatigue, verbal cueing and demonstration to improve gait mechanics.  EOS pt left in chair with chair alarm set and call bell within reach, nurse tech in room.    Follow Up Recommendations        Equipment Recommendations       Recommendations for Other Services       Precautions / Restrictions Precautions Precautions: Fall Precaution Comments: Right avulsion fracture ulnar styloid. No orders regarding precautions in chart. Limit weightbearing as patient is in a wrist brace. Restrictions Weight Bearing Restrictions: Yes RLE Weight Bearing: Weight bearing as tolerated    Mobility  Bed Mobility Overal bed mobility: Needs Assistance Bed Mobility: Sit to Supine Rolling: Mod assist   Supine to sit: Mod assist     General bed mobility comments: Max verbal and tactile cueing for handplacement to assist with bed mobilty  Transfers Overall transfer level: Needs assistance Equipment used: Rolling walker (2 wheeled) Transfers: Sit  to/from Stand Sit to Stand: Min assist         General transfer comment: Max verbal and tactile cueing for handplacement to assist with sit to stand from bed and toilet  Ambulation/Gait Ambulation/Gait assistance: Min assist Ambulation Distance (Feet): 20 Feet Assistive device: Rolling walker (2 wheeled) Gait Pattern/deviations: Decreased step length - right;Decreased stance time - right;Decreased stride length     General Gait Details: Patient demonstrates slow labored cadence requiring help to move RW forward, limited secondary to c/o fatigue.  Max multimodal cueing to increased stride length to normalize gait mechanics.   Stairs            Wheelchair Mobility    Modified Rankin (Stroke Patients Only)       Balance                                            Cognition Arousal/Alertness: Awake/alert Behavior During Therapy: WFL for tasks assessed/performed Overall Cognitive Status: Within Functional Limits for tasks assessed                                        Exercises      General Comments        Pertinent Vitals/Pain Pain Assessment: No/denies pain    Home Living  Prior Function            PT Goals (current goals can now be found in the care plan section)      Frequency    7X/week      PT Plan Current plan remains appropriate    Co-evaluation              AM-PAC PT "6 Clicks" Daily Activity  Outcome Measure  Difficulty turning over in bed (including adjusting bedclothes, sheets and blankets)?: A Little Difficulty moving from lying on back to sitting on the side of the bed? : A Lot Difficulty sitting down on and standing up from a chair with arms (e.g., wheelchair, bedside commode, etc,.)?: A Lot Help needed moving to and from a bed to chair (including a wheelchair)?: A Lot Help needed walking in hospital room?: A Lot Help needed climbing 3-5 steps with a  railing? : Total 6 Click Score: 12    End of Session Equipment Utilized During Treatment: Gait belt Activity Tolerance: Patient tolerated treatment well;Patient limited by fatigue Patient left: in chair;with call bell/phone within reach;with chair alarm set;with nursing/sitter in room Nurse Communication: Mobility status PT Visit Diagnosis: Muscle weakness (generalized) (M62.81);History of falling (Z91.81);Difficulty in walking, not elsewhere classified (R26.2);Unsteadiness on feet (R26.81);Other abnormalities of gait and mobility (R26.89)     Time: 7356-7014 PT Time Calculation (min) (ACUTE ONLY): 29 min  Charges:  $Therapeutic Activity: 23-37 mins                    G Codes:       Ihor Austin, LPTA; CBIS [phone]  Aldona Lento 03/13/2017, 12:43 PM

## 2017-03-13 NOTE — Telephone Encounter (Signed)
Was unable to reach pt by phone.  I have mailed the orders to her to be done around 03/27/2017, and to call if questions.

## 2017-03-14 DIAGNOSIS — R042 Hemoptysis: Secondary | ICD-10-CM

## 2017-03-14 LAB — BASIC METABOLIC PANEL
Anion gap: 10 (ref 5–15)
BUN: 14 mg/dL (ref 6–20)
CALCIUM: 8.9 mg/dL (ref 8.9–10.3)
CO2: 28 mmol/L (ref 22–32)
CREATININE: 0.43 mg/dL — AB (ref 0.44–1.00)
Chloride: 92 mmol/L — ABNORMAL LOW (ref 101–111)
Glucose, Bld: 127 mg/dL — ABNORMAL HIGH (ref 65–99)
Potassium: 3.9 mmol/L (ref 3.5–5.1)
Sodium: 130 mmol/L — ABNORMAL LOW (ref 135–145)

## 2017-03-14 LAB — CBC
HEMATOCRIT: 30.3 % — AB (ref 36.0–46.0)
Hemoglobin: 9.8 g/dL — ABNORMAL LOW (ref 12.0–15.0)
MCH: 28.6 pg (ref 26.0–34.0)
MCHC: 32.3 g/dL (ref 30.0–36.0)
MCV: 88.3 fL (ref 78.0–100.0)
PLATELETS: 229 10*3/uL (ref 150–400)
RBC: 3.43 MIL/uL — ABNORMAL LOW (ref 3.87–5.11)
RDW: 20.3 % — AB (ref 11.5–15.5)
WBC: 8.1 10*3/uL (ref 4.0–10.5)

## 2017-03-14 LAB — MAGNESIUM: Magnesium: 1.3 mg/dL — ABNORMAL LOW (ref 1.7–2.4)

## 2017-03-14 MED ORDER — DICLOFENAC SODIUM 1 % TD GEL
2.0000 g | Freq: Four times a day (QID) | TRANSDERMAL | Status: DC
  Filled 2017-03-14: qty 100

## 2017-03-14 MED ORDER — DICLOFENAC SODIUM 1 % TD GEL: 2.0000 g | Freq: Four times a day (QID) | TRANSDERMAL | 0 refills | Status: DC

## 2017-03-14 MED ORDER — SENNOSIDES-DOCUSATE SODIUM 8.6-50 MG PO TABS: 1.0000 | ORAL_TABLET | Freq: Two times a day (BID) | ORAL | 0 refills | Status: DC

## 2017-03-14 MED ORDER — ENSURE ENLIVE PO LIQD: 237.0000 mL | Freq: Two times a day (BID) | ORAL | 0 refills | Status: DC

## 2017-03-14 MED ORDER — RIVAROXABAN (XARELTO) VTE STARTER PACK (15 & 20 MG): ORAL_TABLET | ORAL | 0 refills | Status: DC

## 2017-03-14 MED ORDER — PANTOPRAZOLE SODIUM 40 MG PO TBEC: 40.0000 mg | DELAYED_RELEASE_TABLET | Freq: Two times a day (BID) | ORAL | 0 refills | Status: DC

## 2017-03-14 MED ORDER — FERROUS SULFATE 325 (65 FE) MG PO TABS: 325.0000 mg | ORAL_TABLET | Freq: Every day | ORAL | 0 refills | Status: DC

## 2017-03-14 NOTE — Telephone Encounter (Signed)
OPV in 6 weeks, Dx: normocytic anemia.

## 2017-03-14 NOTE — Progress Notes (Signed)
Patient discharging to [facility], both IVs removed and sites intact. Pt belongings together for EMS transport. Waiting on EMS transport.

## 2017-03-14 NOTE — Clinical Social Work Placement (Signed)
   CLINICAL SOCIAL WORK PLACEMENT  NOTE  Date:  03/14/2017  Patient Details  Name: Cassidy Smith MRN: [id number] Date of Birth: [birth year]  Clinical Social Work is seeking post-discharge placement for this patient at the [place] level of care (*CSW will initial, date and re-position this form in  chart as items are completed):  Yes   Patient/family provided with [facility] Work Department's list of facilities offering this level of care within the geographic area requested by the patient (or if unable, by the patient's family).  Yes   Patient/family informed of their freedom to choose among providers that offer the needed level of care, that participate in Medicare, Medicaid or managed care program needed by the patient, have an available bed and are willing to accept the patient.      Patient/family informed of [facility]'s ownership interest in [facility] and [facility], as well as of the fact that they are under no obligation to receive care at these facilities.  PASRR submitted to EDS on 03/11/17     PASRR number received on 03/12/17     Existing PASRR number confirmed on       FL2 transmitted to all facilities in geographic area requested by pt/family on 03/11/17     FL2 transmitted to all facilities within larger geographic area on       Patient informed that his/her managed care company has contracts with or will negotiate with certain facilities, including the following:        Yes   Patient/family informed of bed offers received.  Patient chooses bed at [facility]     Physician recommends and patient chooses bed at      Patient to be transferred to [facility] on 03/14/17.  Patient to be transferred to facility by [facility]     Patient family notified on 03/14/17 of transfer.  Name of family member notified:  [name] (brother) in pt's room     PHYSICIAN       Additional Comment: Pt discharging to [facility] today.  Rudi Heap at [facility]. Pt and family in agreement. Updated pt's RN. Once DC clinical is complete, LCSW, Ambrose Pancoast, will send to [facility] and update pt's RN. No additional needs for dc.   _______________________________________________ Shade Flood, LCSW 03/14/2017, 1:13 PM

## 2017-03-14 NOTE — Discharge Summary (Addendum)
Discharge Summary  Cassidy Smith NID:[id number] DOB: Jan 21, 1955  PCP: Sandria Manly [employer] date: 1/44/3154 Discharge date: 03/14/2017  Time spent: 25 minutes  Recommendations for Outpatient Follow-up:  1. Continue physical therapy at SNF 2. Please follow up with surgery 3. Please take your medications as prescribed 4. Fall precaution  Discharge Diagnoses:  Active Hospital Problems   Diagnosis Date Noted  . Closed intertrochanteric fracture of hip, right, initial encounter ([place]) 03/06/2017  . Hypomagnesemia 03/07/2017  . Fall   . Radial styloid fracture: right 03/06/2017  . Anemia 03/06/2017  . History of depression 05/14/2013  . Hx of anxiety disorder 05/14/2013  . Hyponatremia 05/12/2013  . Bipolar disorder ([place]) 05/12/2013    Resolved Hospital Problems  No resolved problems to display.    Discharge Condition: stable  Diet recommendation: Resume previous diet  Vitals:   03/14/17 0300 03/14/17 0919  BP: 131/81   Pulse: 85   Resp: 18   Temp: 98.9 F (37.2 C)   SpO2: 94% 92%    History of present illness:  Patient is a 62 year old female with history of bipolar disorder, anxiety, headache presented to the ED after she tripped and fell on her right side with complaints of right hip pain. Workup revealed right displaced hip fracture as well as a right nondisplaced radial styloid fracture. Patient also noted to be hyponatremic. Admiited for acute right hip fracture s/psurgery 03/07/2017.Patient noted to have swelling in RLE, doppler showed R peroneal DVT. Needs SNF.  03/13/17: patient seen and examined with her brother at her bedside. States right hip pain is well controlled on current pain management. Spoke with orthopedic surgery who is ok with starting xarelto for DVT treatment. Pharmacy consulted to manage xarelto.  On the day of discharge the patient was hemodynamically stable. Pain was well controlled. She will need to follow up with PCP  and surgery post hospitalization.  She passed her home O2 evaluation test and will not require O2 supplement at discharge.   Hospital Course:  Principal Problem:   Closed intertrochanteric fracture of hip, right, initial encounter [facility]) Active Problems:   Hyponatremia   Bipolar disorder ([place])   History of depression   Hx of anxiety disorder   Radial styloid fracture: right   Anemia   Hypomagnesemia   Fall  Right hip intertrochanteric FX POD #7 post repair -mechanical fall -orthopedic surgery following-highly appreciated -Continue PT/OT -continue pain management -fall precaution -PT recommends short term rehab -case worker arranging for placement  RLE DVT  -clinically unable to determined if acute or chronic  -xarelto started today-ok with orthopedic surgery -continue to monitor  Eulvolemic Hyponatremia -most likely multifactorial 2/2 to pain vs others -Na+ 130 form 131 -asymptomatic -repeat chemistry panel in am -control pain  Chronic normocytic anemia -suspect multifactorial 2./2 to iron deficiency vs acute blood loss -hg 11 on admission -lowest 6.7 during this admission -FOBT still pending -hg 9.8 from 9.6 -monitor-repeat cbc am  Hypomagnesemia, repleted -mag 1.3 -mag supplement  Hyperthyroidism -well controlled -TSH 0.664 -continue methimazole  Hypokalemia, resolved  Bipolar/anxiety/depresion -stable -continue home meds -risperdal IM and po restarted    Code Status: full  Family Communication: brother at bedside  Disposition Plan: SNF for short term rehab 2/29/19   Consultants:  Orthopedic surgery  Social worker  PT  Procedures:  Left right hip repair  Antimicrobials:  none   DVT prophylaxis:  Xarelto    Discharge Exam: BP 131/81 (BP Location: Right Arm)   Pulse 85  Temp 98.9 F (37.2 C) (Oral)   Resp 18   Ht 5\' 6"  (1.676 m)   Wt 63.8 kg (140 lb 10.5 oz)   SpO2 92%   BMI 22.70 kg/m    General: 62 yo CF WD wN NAD A&O x 3 Cardiovascular: RRR no rubs or gallops Respiratory: CTA no wheezes or rales  Discharge Instructions You were cared for by a hospitalist during your hospital stay. If you have any questions about your discharge medications or the care you received while you were in the hospital after you are discharged, you can call the unit and asked to speak with the hospitalist on call if the hospitalist that took care of you is not available. Once you are discharged, your primary care physician will handle any further medical issues. Please note that NO REFILLS for any discharge medications will be authorized once you are discharged, as it is imperative that you return to your primary care physician (or establish a relationship with a primary care physician if you do not have one) for your aftercare needs so that they can reassess your need for medications and monitor your lab values.  Discharge Instructions    apixaban (Eliquis) per pharmacy consult   Complete by:  As directed      Allergies as of 03/14/2017   No Known Allergies     Medication List    STOP taking these medications   clonazePAM 0.5 MG tablet Commonly known as:  KLONOPIN     TAKE these medications   aspirin 81 MG EC tablet Take 1 tablet (81 mg total) by mouth daily.   benztropine 1 MG tablet Commonly known as:  COGENTIN Take 1 mg by mouth 2 (two) times daily.   CENTRUM SILVER ADULT 50+ PO Take 1 tablet by mouth every morning.   diclofenac sodium 1 % Gel Commonly known as:  VOLTAREN Apply 2 g topically 4 (four) times daily.   divalproex 500 MG 24 hr tablet Commonly known as:  DEPAKOTE ER Take 1,500 mg by mouth at bedtime.   feeding supplement (ENSURE ENLIVE) Liqd Take 237 mLs by mouth 2 (two) times daily between meals.   ferrous sulfate 325 (65 FE) MG tablet Take 1 tablet (325 mg total) by mouth daily with breakfast. Start taking on:  03/15/2017   methimazole 10 MG tablet Commonly  known as:  TAPAZOLE Take 10 mg by mouth daily.   pantoprazole 40 MG tablet Commonly known as:  PROTONIX Take 1 tablet (40 mg total) by mouth 2 (two) times daily before a meal.   RISPERDAL CONSTA 50 MG injection Generic drug:  risperiDONE microspheres Inject 50 mg into the muscle every 14 (fourteen) days.   risperidone 4 MG tablet Commonly known as:  RISPERDAL Take 4 mg by mouth at bedtime.   Rivaroxaban 15 & 20 MG Tbpk Take as directed on package: Start with one 15mg  tablet by mouth twice a day with food. On Day 22, switch to one 20mg  tablet once a day with food.   senna-docusate 8.6-50 MG tablet Commonly known as:  Senokot-S Take 1 tablet by mouth 2 (two) times daily.   traZODone 100 MG tablet Commonly known as:  DESYREL Take 200 mg by mouth at bedtime.   vitamin B-12 1000 MCG tablet Commonly known as:  CYANOCOBALAMIN Take 1,000 mcg by mouth daily.      No Known Allergies  Contact information for follow-up providers    [facility] Follow up in 2 week(s).  Contact information: [place] [address] [phone]         Civil, MD Follow up in 1 week(s).   Specialties:  Orthopedic Surgery, Radiology Contact information: [address] [place] [address] [phone]            Contact information for after-discharge care    Destination    [facility] Follow up.   Service:  Skilled Nursing Contact information: [place] [phone]                   The results of significant diagnostics from this hospitalization (including imaging, microbiology, ancillary and laboratory) are listed below for reference.    Significant Diagnostic Studies: Dg Chest 1 View  Result Date: 03/06/2017 CLINICAL DATA:  Fall today with right hip fracture. EXAM: CHEST 1 VIEW COMPARISON:  01/26/2016 FINDINGS: The cardiomediastinal silhouette is within normal limits.  The lungs remain hyperinflated. There is minimal scarring in the left lung base. No confluent airspace opacity, edema, pleural effusion, or pneumothorax is identified. No acute osseous abnormality is seen. IMPRESSION: No active disease. Electronically Signed   By: Logan Bores M.D.   On: 03/06/2017 13:15   Dg Wrist Complete Right  Result Date: 03/06/2017 CLINICAL DATA:  Right wrist pain due to a fall today. Initial encounter. EXAM: RIGHT WRIST - COMPLETE 3+ VIEW COMPARISON:  None. FINDINGS: Subtle cortical irregularity radius at the styloid extending to the articular surface is seen in the distal consistent with a nondisplaced fracture. No other acute bony or joint abnormality is identified. IMPRESSION: Nondisplaced radial styloid fracture. Electronically Signed   By: Inge Rise M.D.   On: 03/06/2017 13:11   US Venous Img Lower Unilateral Right  Result Date: 03/12/2017 CLINICAL DATA:  Right lower extremity edema. History of fall 1 week ago. History smoking. Evaluate for DVT. EXAM: RIGHT LOWER EXTREMITY VENOUS DOPPLER ULTRASOUND TECHNIQUE: Gray-scale sonography with graded compression, as well as color Doppler and duplex ultrasound were performed to evaluate the lower extremity deep venous systems from the level of the common femoral vein and including the common femoral, femoral, profunda femoral, popliteal and calf veins including the posterior tibial, peroneal and gastrocnemius veins when visible. The superficial great saphenous vein was also interrogated. Spectral Doppler was utilized to evaluate flow at rest and with distal augmentation maneuvers in the common femoral, femoral and popliteal veins. COMPARISON:  None. FINDINGS: Contralateral Common Femoral Vein: Respiratory phasicity is normal and symmetric with the symptomatic side. No evidence of thrombus. Normal compressibility. Common Femoral Vein: No evidence of thrombus. Normal compressibility, respiratory phasicity and response to  augmentation. Saphenofemoral Junction: No evidence of thrombus. Normal compressibility and flow on color Doppler imaging. Profunda Femoral Vein: No evidence of thrombus. Normal compressibility and flow on color Doppler imaging. Femoral Vein: No evidence of thrombus. Normal compressibility, respiratory phasicity and response to augmentation. Popliteal Vein: No evidence of thrombus. Normal compressibility, respiratory phasicity and response to augmentation. Calf Veins: There is hypoechoic slightly expansile occlusive thrombus within the right peroneal vein (images 35 through 37). The right anterior and posterior tibial veins appear patent where imaged. Superficial Great Saphenous Vein: No evidence of thrombus. Normal compressibility. Venous Reflux:  None. Other Findings:  None. IMPRESSION: Examination is positive for occlusive DVT within the right peroneal vein. There is no extension of this short-segment distal calf DVT to the more proximal venous system of the right lower extremity. Electronically Signed   By: Jenny Reichmann  Watts M.D.   On: 03/12/2017 10:57   Dg Hip Port Unilat W Or W/o Pelvis 1v Right  Result Date: 03/13/2017 CLINICAL DATA:  Status post right intramedullary nailing of the femur. EXAM: DG HIP (WITH OR WITHOUT PELVIS) 1V PORT RIGHT COMPARISON:  Intraoperative fluoro spot views of today's date FINDINGS: The patient has undergone inter medullary nail and telescoping screw placement for an acute intertrochanteric fracture of the right femur alignment is near anatomic. The positioning of the prosthetic components is good. IMPRESSION: No immediate complication following intramedullary nail and telescoping screw placement in the proximal right femur. Electronically Signed   By: David  Martinique M.D.   On: 03/13/2017 16:51   Dg Hip Operative Unilat With Pelvis Right  Result Date: 03/07/2017 CLINICAL DATA:  Right hip fracture. EXAM: OPERATIVE right HIP (WITH PELVIS IF PERFORMED) 2 VIEWS TECHNIQUE: Fluoroscopic  spot image(s) were submitted for interpretation post-operatively. COMPARISON:  03/06/2017 FINDINGS: Multiple C-arm images demonstrate the patient undergoing open reduction and internal fixation of the intertrochanteric fracture of the proximal right femur. Alignment and position of the major fracture fragments is near anatomic. Gama intramedullary nail fixation. IMPRESSION: Open reduction internal fixation of right proximal femur fracture. Electronically Signed   By: Lorriane Shire M.D.   On: 03/07/2017 15:15   Dg Hip Unilat W Or Wo Pelvis 2-3 Views Right  Result Date: 03/06/2017 CLINICAL DATA:  Fall.  Right hip pain and deformity. EXAM: DG HIP (WITH OR WITHOUT PELVIS) 2-3V RIGHT COMPARISON:  11/24/2015. FINDINGS: Diffuse osteopenia degenerative change. Right intertrochanteric hip fracture with angulation deformity noted. Fractures fragments are displaced. IMPRESSION: Displaced angulated right intertrochanteric hip fracture. Electronically Signed   By: Marcello Moores  Register   On: 03/06/2017 13:09    Microbiology: Recent Results (from the past 240 hour(s))  Culture, Urine     Status: Abnormal   Collection Time: 03/06/17  5:00 PM  Result Value Ref Range Status   Specimen Description   Final    URINE, CLEAN CATCH Performed at [facility], [address], [place], [place] [address]    Special Requests   Final    NONE Performed at [facility], [address], [place], [place] [address]    Culture (A)  Final    <10,000 COLONIES/mL INSIGNIFICANT GROWTH Performed at [facility] [address], [place], [place] [address]    Report Status 03/08/2017 FINAL  Final  Surgical PCR screen     Status: None   Collection Time: 03/06/17  5:00 PM  Result Value Ref Range Status   MRSA, PCR NEGATIVE NEGATIVE Final   Staphylococcus aureus NEGATIVE NEGATIVE Final    Comment: (NOTE) The Xpert SA Assay (FDA approved for NASAL specimens in patients 66 years of age and older), is one component of a  comprehensive surveillance program. It is not intended to diagnose infection nor to guide or monitor treatment. Performed at [facility], [address], [place], [place] [address]      Labs: Basic Metabolic Panel: Recent Labs  Lab 03/09/17 0606 03/10/17 3419 03/11/17 0615 03/12/17 0409 03/12/17 0757 03/13/17 0642 03/14/17 0610  NA 132* 132* 129* 131*  --  130* 130*  K 3.6 3.5 3.8 3.4*  --  3.8 3.9  CL 100* 96* 93* 94*  --  92* 92*  CO2 26 29 28 29   --  29 28  GLUCOSE 103* 93 88 84  --  95 127*  BUN 9 10 15 14   --  13 14  CREATININE 0.35*  0.31* 0.37* 0.37*  --  0.41* 0.43*  CALCIUM 8.7* 8.8* 8.9 8.9  --  9.0 8.9  MG 1.4* 1.5*  --   --  1.2* 1.3* 1.3*   Liver Function Tests: No results for input(s): AST, ALT, ALKPHOS, BILITOT, PROT, ALBUMIN in the last 168 hours. No results for input(s): LIPASE, AMYLASE in the last 168 hours. No results for input(s): AMMONIA in the last 168 hours. CBC: Recent Labs  Lab 03/10/17 0504 03/11/17 0615 03/12/17 0409 03/13/17 0642 03/14/17 0610  WBC 6.8 7.7 6.8 7.8 8.1  HGB 6.7* 9.9* 10.1* 9.6* 9.8*  HCT 20.3* 29.7* 30.0* 30.2* 30.3*  MCV 91.0 84.1 86.2 87.3 88.3  PLT 100* 131* 161 212 229   Cardiac Enzymes: Recent Labs  Lab 03/11/17 1238  TROPONINI <0.03   BNP: BNP (last 3 results) No results for input(s): BNP in the last 8760 hours.  ProBNP (last 3 results) No results for input(s): PROBNP in the last 8760 hours.  CBG: No results for input(s): GLUCAP in the last 168 hours.     Signed:  Kayleen Memos, MD [facility] 03/14/2017, 1:15 PM

## 2017-03-14 NOTE — Progress Notes (Signed)
Called report to [facility]. Awaiting EMS transport.

## 2017-03-14 NOTE — Discharge Instructions (Signed)
Fall Prevention in Hospitals, Adult WHAT ARE SOME SAFETY TIPS FOR PREVENTING FALLS? If you or a loved one has to stay in the hospital, talk with the health care providers about the risk of falling. Find out which medicines or treatments can cause dizziness or affect balance. Make a plan with the health care providers to prevent falls. The plan may include these points:  Ask for help moving around, especially after surgery or when feeling unwell.  Have support available when getting up and moving around.  Wear nonskid footwear.  Use the safety straps on wheelchairs.  Ask for help to get objects that are out of reach.  Wear eyeglasses.  Remove all clutter from the floor and the sides of the bed.  Keep equipment and wires securely out of the way.  Keep the bed locked in the low position.  Keep the side rails up on the bed.  Keep the nurse call button within reach.  Keep the door open when no one else is in the room.  Have someone stay in the hospital with you or your loved one.  Ask for a bed alarm if you are not able to stay with your loved one who is at risk for getting up without help.  Ask if sleeping pills or other medicines that alter mental status are necessary.  WHAT INCREASES THE RISK FOR FALLS? Certain conditions and treatments may increase a patient's risk for falls in a hospital. These include:  Being in an unfamiliar environment.  Being on bed rest.  Having surgery.  Taking certain medicines, such as sleeping pills.  Having tubes in place, such as IV lines or catheters.  Additional risk factors for falls in a hospital include:  Having vision problems.  Having a change in thinking, feeling, or behavior (altered mental status).  Having trouble with balance.  Needing to use the toilet frequently.  Having fallen in the past three months.  Having low blood pressure when standing up quickly (orthostatic hypotension).  WHAT DOES THE HOSPITAL STAFF DO TO  HELP PREVENT ME OR MY LOVED ONE FROM FALLING? Hospitals have systems in place to prevent falls and accidents. Talk with the hospital staff about:  Doing an assessment to discuss fall risks and create a personalized plan to prevent falls.  Checking in regularly to see if help is needed for moving around and to assess any changes in fall risk.  Knowing where the nurse call button is and how to use it. Use this to call a nursing care provider any time.  Keeping personal items within reach. This includes eyeglasses, phones, and other electronic devices.  Following general safety guidelines when moving around.  Keeping the area around the bed free from clutter.  Removing unnecessary equipment or tubes to reduce the risk of tripping.  Using safety equipment, such as: ? Walkers, crutches, and other walking devices for support. ? Safety rails on the bed. ? Safety straps in the bed. ? A bed that can be lowered and locked to prevent movement. ? Handrails in the bathroom. ? Nonskid socks and shoes. ? Locking mechanisms to secure equipment in place. ? Lifting and transfer equipment.  WHAT CAN I DO TO HELP PREVENT A FALL?  Talk with health care providers about fall prevention.  Have a personalized fall prevention plan in place.  Do not try to move around if you feel off balance or ill.  Change position slowly.  Sit on the side of the bed before standing up.  Sit down and call for help if you feel dizzy or unsteady when standing.  Keep the hospital room clear of clutter.  WHEN SHOULD I ASK FOR HELP? Ask for help whenever you:  Are not sure if you are able to move around safely.  Feel dizzy or unsteady.  Are not comfortable helping your loved one move around or use the bathroom.  If you or a loved one falls, tell the hospital staff. This is important. This information is not intended to replace advice given to you by your health care provider. Make sure you discuss any questions  you have with your health care provider. Document Released: 12/29/1999 Document Revised: 05/30/2015 Document Reviewed: 10/13/2014 Elsevier Interactive Patient Education  2017 [employer].   Open Reduction and Internal Fixation for Hip Fracture, Care After Refer to this sheet in the next few weeks. These instructions provide you with information about caring for yourself after your procedure. Your health care provider may also give you more specific instructions. Your treatment has been planned according to current medical practices, but problems sometimes occur. Call your health care provider if you have any problems or questions after your procedure. What can I expect after the procedure? After your procedure, it is typical to have the following:  Pain.  Swelling.  Difficulty walking.  Follow these instructions at home:  Have someone help you with everyday activities, such as showering and meals, for the first week after you leave the hospital or as directed.  Take medicines only as directed by your health care provider. Do not take any over-the-counter medicines without approval from your health care provider. Medicines such as aspirin and ibuprofen can increase your risk of bleeding.  Constipation is common after surgery from the pain medicines used and the decrease in your activity level. It is important to drink fluids and to increase your intake of fruits and vegetables. Stool softeners may be prescribed by your health care provider.  There are many different ways to close and cover an incision, including stitches, skin glue, and adhesive strips. Follow your health care provider's instructions about: ? Incision care. ? Bandage (dressing) changes and removal. ? Incision closure removal.  Wear compression stockings as directed by your health care provider. These stockings help to prevent blood clots and reduce swelling in your legs.  Do not take baths, swim, or use a hot tub  until your health care provider approves.  Use crutches or a walker as directed by your health care provider.  Be sure to do any exercises that your physical therapist suggests. These exercises will help to make your hip stronger and help you to recover more quickly.  Ask your health care provider when you can resume other activities, such as work, driving, and sex.  Do not drive or operate heavy machinery while taking pain medicine.  Keep all follow-up visits as directed by your health care provider. This is important. Contact a health care provider if:  You have fatigue.  You feel weak.  Your pain is not relieved by medicines.  You have a fever.  You have drainage, redness, swelling, or pain at your incision. Get help right away if:  Your incision is bleeding.  Your leg or foot is painful and swollen.  Your leg is pale or blue.  Your leg is cold.  Your leg tingles or is numb.  You have trouble breathing.  You have chest pain. This information is not intended to replace advice given to you by  your health care provider. Make sure you discuss any questions you have with your health care provider. Document Released: 07/28/2013 Document Revised: 06/05/2015 Document Reviewed: 06/03/2013 Elsevier Interactive Patient Education  2018 [place], Adult Taking care of your wound properly can help to prevent pain and infection. It can also help your wound to heal more quickly. How is this treated? Wound care  Follow instructions from your health care provider about how to take care of your wound. Make sure you: ? Wash your hands with soap and water before you change the bandage (dressing). If soap and water are not available, use hand sanitizer. ? Change your dressing as told by your health care provider. ? Leave stitches (sutures), skin glue, or adhesive strips in place. These skin closures may need to stay in place for 2 weeks or longer. If adhesive strip  edges start to loosen and curl up, you may trim the loose edges. Do not remove adhesive strips completely unless your health care provider tells you to do that.  Check your wound area every day for signs of infection. Check for: ? More redness, swelling, or pain. ? More fluid or blood. ? Warmth. ? Pus or a bad smell.  Ask your health care provider if you should clean the wound with mild soap and water. Doing this may include: ? Using a clean towel to pat the wound dry after cleaning it. Do not rub or scrub the wound. ? Applying a cream or ointment. Do this only as told by your health care provider. ? Covering the incision with a clean dressing.  Ask your health care provider when you can leave the wound uncovered. Medicines   If you were prescribed an antibiotic medicine, cream, or ointment, take or use the antibiotic as told by your health care provider. Do not stop taking or using the antibiotic even if your condition improves.  Take over-the-counter and prescription medicines only as told by your health care provider. If you were prescribed pain medicine, take it at least 30 minutes before doing any wound care or as told by your health care provider. General instructions  Return to your normal activities as told by your health care provider. Ask your health care provider what activities are safe.  Do not scratch or pick at the wound.  Keep all follow-up visits as told by your health care provider. This is important.  Eat a diet that includes protein, vitamin A, vitamin C, and other nutrient-rich foods. These help the wound heal: ? Protein-rich foods include meat, dairy, beans, nuts, and other sources. ? Vitamin A-rich foods include carrots and dark green, leafy vegetables. ? Vitamin C-rich foods include citrus, tomatoes, and other fruits and vegetables. ? Nutrient-rich foods have protein, carbohydrates, fat, vitamins, or minerals. Eat a variety of healthy foods including  vegetables, fruits, and whole grains. Contact a health care provider if:  You received a tetanus shot and you have swelling, severe pain, redness, or bleeding at the injection site.  Your pain is not controlled with medicine.  You have more redness, swelling, or pain around the wound.  You have more fluid or blood coming from the wound.  Your wound feels warm to the touch.  You have pus or a bad smell coming from the wound.  You have a fever or chills.  You are nauseous or you vomit.  You are dizzy. Get help right away if:  You have a red streak going away from your  wound.  The edges of the wound open up and separate.  Your wound is bleeding and the bleeding does not stop with gentle pressure.  You have a rash.  You faint.  You have trouble breathing. This information is not intended to replace advice given to you by your health care provider. Make sure you discuss any questions you have with your health care provider. Document Released: 10/10/2007 Document Revised: 08/30/2015 Document Reviewed: 07/18/2015 Elsevier Interactive Patient Education  2017 [employer].

## 2017-03-17 NOTE — Telephone Encounter (Signed)
Patient scheduled.

## 2017-03-24 DIAGNOSIS — Z8781 Personal history of (healed) traumatic fracture: Secondary | ICD-10-CM

## 2017-03-24 DIAGNOSIS — Z9889 Other specified postprocedural states: Secondary | ICD-10-CM | POA: Insufficient documentation

## 2017-03-25 ENCOUNTER — Ambulatory Visit: Payer: Self-pay | Admitting: Orthopedic Surgery

## 2017-03-28 ENCOUNTER — Ambulatory Visit (INDEPENDENT_AMBULATORY_CARE_PROVIDER_SITE_OTHER): Payer: Medicaid Other | Admitting: Orthopedic Surgery

## 2017-03-28 ENCOUNTER — Ambulatory Visit (INDEPENDENT_AMBULATORY_CARE_PROVIDER_SITE_OTHER): Payer: Medicaid Other

## 2017-03-28 ENCOUNTER — Encounter: Payer: Self-pay | Admitting: Orthopedic Surgery

## 2017-03-28 VITALS — BP 113/65 | HR 79 | Ht 65.0 in

## 2017-03-28 DIAGNOSIS — Z9889 Other specified postprocedural states: Secondary | ICD-10-CM

## 2017-03-28 DIAGNOSIS — Z8781 Personal history of (healed) traumatic fracture: Secondary | ICD-10-CM

## 2017-03-28 DIAGNOSIS — S72141A Displaced intertrochanteric fracture of right femur, initial encounter for closed fracture: Secondary | ICD-10-CM

## 2017-03-28 DIAGNOSIS — S52514A Nondisplaced fracture of right radial styloid process, initial encounter for closed fracture: Secondary | ICD-10-CM

## 2017-03-28 DIAGNOSIS — Z967 Presence of other bone and tendon implants: Secondary | ICD-10-CM

## 2017-03-28 NOTE — Progress Notes (Signed)
Chief Complaint  Patient presents with  . Routine Post Op    right hip IM nail 03/07/17  . Wrist Injury    fracture 03/06/17 radial styloid    Wound staples extracted Clean wound  Limb alignment normal  X-ray looks good  Follow-up 4 weeks repeat x-ray  Weight-bear as tolerated wrist and leg okay to go home from my point of view if she is met all criteria should have home health and nurse visit

## 2017-03-28 NOTE — Patient Instructions (Signed)
Weight bear as tolerated

## 2017-04-03 ENCOUNTER — Inpatient Hospital Stay (HOSPITAL_COMMUNITY)
Admission: EM | Admit: 2017-04-03 | Discharge: 2017-04-17 | DRG: 393 | Disposition: A | Discharge status: Skilled Nursing Facility | Payer: Medicaid Other | Attending: Internal Medicine | Admitting: Internal Medicine

## 2017-04-03 ENCOUNTER — Encounter (HOSPITAL_COMMUNITY): Payer: Self-pay | Admitting: Emergency Medicine

## 2017-04-03 ENCOUNTER — Other Ambulatory Visit: Payer: Self-pay

## 2017-04-03 DIAGNOSIS — K922 Gastrointestinal hemorrhage, unspecified: Secondary | ICD-10-CM

## 2017-04-03 DIAGNOSIS — I951 Orthostatic hypotension: Secondary | ICD-10-CM | POA: Diagnosis present

## 2017-04-03 DIAGNOSIS — E43 Unspecified severe protein-calorie malnutrition: Secondary | ICD-10-CM | POA: Diagnosis present

## 2017-04-03 DIAGNOSIS — Z8673 Personal history of transient ischemic attack (TIA), and cerebral infarction without residual deficits: Secondary | ICD-10-CM | POA: Diagnosis not present

## 2017-04-03 DIAGNOSIS — Z8659 Personal history of other mental and behavioral disorders: Secondary | ICD-10-CM

## 2017-04-03 DIAGNOSIS — F17200 Nicotine dependence, unspecified, uncomplicated: Secondary | ICD-10-CM | POA: Diagnosis present

## 2017-04-03 DIAGNOSIS — D62 Acute posthemorrhagic anemia: Secondary | ICD-10-CM | POA: Diagnosis present

## 2017-04-03 DIAGNOSIS — I82491 Acute embolism and thrombosis of other specified deep vein of right lower extremity: Secondary | ICD-10-CM | POA: Diagnosis present

## 2017-04-03 DIAGNOSIS — D127 Benign neoplasm of rectosigmoid junction: Secondary | ICD-10-CM | POA: Diagnosis present

## 2017-04-03 DIAGNOSIS — F3130 Bipolar disorder, current episode depressed, mild or moderate severity, unspecified: Secondary | ICD-10-CM | POA: Diagnosis present

## 2017-04-03 DIAGNOSIS — E0590 Thyrotoxicosis, unspecified without thyrotoxic crisis or storm: Secondary | ICD-10-CM | POA: Diagnosis present

## 2017-04-03 DIAGNOSIS — Z8781 Personal history of (healed) traumatic fracture: Secondary | ICD-10-CM | POA: Diagnosis not present

## 2017-04-03 DIAGNOSIS — K921 Melena: Secondary | ICD-10-CM | POA: Diagnosis present

## 2017-04-03 DIAGNOSIS — F419 Anxiety disorder, unspecified: Secondary | ICD-10-CM | POA: Diagnosis present

## 2017-04-03 DIAGNOSIS — Z7901 Long term (current) use of anticoagulants: Secondary | ICD-10-CM | POA: Diagnosis not present

## 2017-04-03 DIAGNOSIS — L89211 Pressure ulcer of right hip, stage 1: Secondary | ICD-10-CM | POA: Diagnosis present

## 2017-04-03 DIAGNOSIS — E222 Syndrome of inappropriate secretion of antidiuretic hormone: Secondary | ICD-10-CM | POA: Diagnosis present

## 2017-04-03 DIAGNOSIS — K644 Residual hemorrhoidal skin tags: Secondary | ICD-10-CM | POA: Diagnosis present

## 2017-04-03 DIAGNOSIS — Z800 Family history of malignant neoplasm of digestive organs: Secondary | ICD-10-CM | POA: Diagnosis not present

## 2017-04-03 DIAGNOSIS — Z9049 Acquired absence of other specified parts of digestive tract: Secondary | ICD-10-CM | POA: Diagnosis not present

## 2017-04-03 DIAGNOSIS — K625 Hemorrhage of anus and rectum: Secondary | ICD-10-CM | POA: Diagnosis present

## 2017-04-03 DIAGNOSIS — K529 Noninfective gastroenteritis and colitis, unspecified: Secondary | ICD-10-CM | POA: Diagnosis present

## 2017-04-03 DIAGNOSIS — Z967 Presence of other bone and tendon implants: Secondary | ICD-10-CM

## 2017-04-03 DIAGNOSIS — K6389 Other specified diseases of intestine: Secondary | ICD-10-CM | POA: Diagnosis not present

## 2017-04-03 DIAGNOSIS — Z86718 Personal history of other venous thrombosis and embolism: Secondary | ICD-10-CM

## 2017-04-03 DIAGNOSIS — F17210 Nicotine dependence, cigarettes, uncomplicated: Secondary | ICD-10-CM | POA: Diagnosis present

## 2017-04-03 DIAGNOSIS — E871 Hypo-osmolality and hyponatremia: Secondary | ICD-10-CM | POA: Diagnosis present

## 2017-04-03 DIAGNOSIS — Z681 Body mass index (BMI) 19 or less, adult: Secondary | ICD-10-CM | POA: Diagnosis not present

## 2017-04-03 DIAGNOSIS — I82409 Acute embolism and thrombosis of unspecified deep veins of unspecified lower extremity: Secondary | ICD-10-CM

## 2017-04-03 DIAGNOSIS — L8990 Pressure ulcer of unspecified site, unspecified stage: Secondary | ICD-10-CM | POA: Diagnosis present

## 2017-04-03 DIAGNOSIS — R634 Abnormal weight loss: Secondary | ICD-10-CM

## 2017-04-03 DIAGNOSIS — K621 Rectal polyp: Principal | ICD-10-CM | POA: Diagnosis present

## 2017-04-03 HISTORY — DX: Syndrome of inappropriate secretion of antidiuretic hormone: E22.2

## 2017-04-03 HISTORY — DX: Thyrotoxicosis, unspecified without thyrotoxic crisis or storm: E05.90

## 2017-04-03 LAB — TYPE AND SCREEN
ABO/RH(D): O POS
ANTIBODY SCREEN: NEGATIVE

## 2017-04-03 LAB — COMPREHENSIVE METABOLIC PANEL
ALT: 9 U/L — AB (ref 14–54)
ANION GAP: 8 (ref 5–15)
AST: 14 U/L — ABNORMAL LOW (ref 15–41)
Albumin: 2.6 g/dL — ABNORMAL LOW (ref 3.5–5.0)
Alkaline Phosphatase: 211 U/L — ABNORMAL HIGH (ref 38–126)
BUN: 11 mg/dL (ref 6–20)
CHLORIDE: 94 mmol/L — AB (ref 101–111)
CO2: 29 mmol/L (ref 22–32)
CREATININE: 0.43 mg/dL — AB (ref 0.44–1.00)
Calcium: 8.8 mg/dL — ABNORMAL LOW (ref 8.9–10.3)
Glucose, Bld: 97 mg/dL (ref 65–99)
POTASSIUM: 4.1 mmol/L (ref 3.5–5.1)
SODIUM: 131 mmol/L — AB (ref 135–145)
Total Bilirubin: 0.5 mg/dL (ref 0.3–1.2)
Total Protein: 5.8 g/dL — ABNORMAL LOW (ref 6.5–8.1)

## 2017-04-03 LAB — CBC WITH DIFFERENTIAL/PLATELET
Basophils Absolute: 0 10*3/uL (ref 0.0–0.1)
Basophils Relative: 0 %
EOS ABS: 0.1 10*3/uL (ref 0.0–0.7)
EOS PCT: 2 %
HCT: 34.6 % — ABNORMAL LOW (ref 36.0–46.0)
Hemoglobin: 10.8 g/dL — ABNORMAL LOW (ref 12.0–15.0)
LYMPHS ABS: 1.9 10*3/uL (ref 0.7–4.0)
Lymphocytes Relative: 26 %
MCH: 29.3 pg (ref 26.0–34.0)
MCHC: 31.2 g/dL (ref 30.0–36.0)
MCV: 94 fL (ref 78.0–100.0)
MONO ABS: 0.9 10*3/uL (ref 0.1–1.0)
Monocytes Relative: 13 %
Neutro Abs: 4.4 10*3/uL (ref 1.7–7.7)
Neutrophils Relative %: 59 %
PLATELETS: 296 10*3/uL (ref 150–400)
RBC: 3.68 MIL/uL — AB (ref 3.87–5.11)
RDW: 20.6 % — AB (ref 11.5–15.5)
WBC: 7.4 10*3/uL (ref 4.0–10.5)

## 2017-04-03 LAB — PROTIME-INR
INR: 1.06
PROTHROMBIN TIME: 13.7 s (ref 11.4–15.2)

## 2017-04-03 LAB — MAGNESIUM: MAGNESIUM: 1.4 mg/dL — AB (ref 1.7–2.4)

## 2017-04-03 LAB — I-STAT CG4 LACTIC ACID, ED: Lactic Acid, Venous: 0.75 mmol/L (ref 0.5–1.9)

## 2017-04-03 MED ORDER — TRAZODONE HCL 50 MG PO TABS
200.0000 mg | ORAL_TABLET | Freq: Every day | ORAL | Status: DC
  Administered 2017-04-03 – 2017-04-16 (×14): 200 mg via ORAL
  Filled 2017-04-03 (×14): qty 4

## 2017-04-03 MED ORDER — METHIMAZOLE 5 MG PO TABS
10.0000 mg | ORAL_TABLET | Freq: Every day | ORAL | Status: DC
  Administered 2017-04-03 – 2017-04-17 (×14): 10 mg via ORAL
  Filled 2017-04-03 (×10): qty 2
  Filled 2017-04-03: qty 1
  Filled 2017-04-03 (×5): qty 2
  Filled 2017-04-03: qty 1
  Filled 2017-04-03: qty 2

## 2017-04-03 MED ORDER — FERROUS SULFATE 325 (65 FE) MG PO TABS
325.0000 mg | ORAL_TABLET | Freq: Every day | ORAL | Status: DC
  Administered 2017-04-04 – 2017-04-17 (×13): 325 mg via ORAL
  Filled 2017-04-03 (×13): qty 1

## 2017-04-03 MED ORDER — VITAMIN B-12 1000 MCG PO TABS
1000.0000 ug | ORAL_TABLET | Freq: Every day | ORAL | Status: DC
  Administered 2017-04-03 – 2017-04-17 (×14): 1000 ug via ORAL
  Filled 2017-04-03 (×14): qty 1

## 2017-04-03 MED ORDER — RISPERIDONE 1 MG PO TABS
4.0000 mg | ORAL_TABLET | Freq: Every day | ORAL | Status: DC
  Administered 2017-04-03 – 2017-04-16 (×14): 4 mg via ORAL
  Filled 2017-04-03 (×14): qty 4

## 2017-04-03 MED ORDER — MAGNESIUM SULFATE 4 GM/100ML IV SOLN
4.0000 g | Freq: Once | INTRAVENOUS | Status: AC
  Administered 2017-04-03: 4 g via INTRAVENOUS
  Filled 2017-04-03: qty 100

## 2017-04-03 MED ORDER — ACETAMINOPHEN 650 MG RE SUPP: 650.0000 mg | Freq: Four times a day (QID) | RECTAL | Status: DC | PRN

## 2017-04-03 MED ORDER — ONDANSETRON HCL 4 MG PO TABS
4.0000 mg | ORAL_TABLET | Freq: Four times a day (QID) | ORAL | Status: DC | PRN
Start: 2017-04-03 — End: 2017-04-17

## 2017-04-03 MED ORDER — PANTOPRAZOLE SODIUM 40 MG PO TBEC
40.0000 mg | DELAYED_RELEASE_TABLET | Freq: Two times a day (BID) | ORAL | Status: DC
  Administered 2017-04-03 – 2017-04-17 (×27): 40 mg via ORAL
  Filled 2017-04-03 (×27): qty 1

## 2017-04-03 MED ORDER — ONDANSETRON HCL 4 MG/2ML IJ SOLN
4.0000 mg | Freq: Four times a day (QID) | INTRAMUSCULAR | Status: DC | PRN
  Administered 2017-04-05 – 2017-04-16 (×4): 4 mg via INTRAVENOUS
  Filled 2017-04-03 (×4): qty 2

## 2017-04-03 MED ORDER — NICOTINE 14 MG/24HR TD PT24
14.0000 mg | MEDICATED_PATCH | Freq: Every day | TRANSDERMAL | Status: DC
  Administered 2017-04-03 – 2017-04-15 (×12): 14 mg via TRANSDERMAL
  Filled 2017-04-03 (×12): qty 1

## 2017-04-03 MED ORDER — BENZTROPINE MESYLATE 1 MG PO TABS
1.0000 mg | ORAL_TABLET | Freq: Two times a day (BID) | ORAL | Status: DC
  Administered 2017-04-03 – 2017-04-17 (×28): 1 mg via ORAL
  Filled 2017-04-03 (×28): qty 1

## 2017-04-03 MED ORDER — SENNOSIDES-DOCUSATE SODIUM 8.6-50 MG PO TABS
1.0000 | ORAL_TABLET | Freq: Two times a day (BID) | ORAL | Status: DC
  Administered 2017-04-03 – 2017-04-17 (×25): 1 via ORAL
  Filled 2017-04-03 (×27): qty 1

## 2017-04-03 MED ORDER — DIVALPROEX SODIUM ER 500 MG PO TB24
1500.0000 mg | ORAL_TABLET | Freq: Every day | ORAL | Status: DC
  Administered 2017-04-03 – 2017-04-16 (×14): 1500 mg via ORAL
  Filled 2017-04-03 (×14): qty 3

## 2017-04-03 MED ORDER — ENSURE ENLIVE PO LIQD
237.0000 mL | Freq: Two times a day (BID) | ORAL | Status: DC
  Administered 2017-04-03 – 2017-04-17 (×19): 237 mL via ORAL

## 2017-04-03 MED ORDER — SODIUM CHLORIDE 0.9% FLUSH: 3.0000 mL | INTRAVENOUS | Status: DC | PRN

## 2017-04-03 MED ORDER — SODIUM CHLORIDE 0.9% FLUSH
3.0000 mL | Freq: Two times a day (BID) | INTRAVENOUS | Status: DC
  Administered 2017-04-03 – 2017-04-17 (×21): 3 mL via INTRAVENOUS

## 2017-04-03 MED ORDER — SODIUM CHLORIDE 0.9 % IV SOLN
250.0000 mL | INTRAVENOUS | Status: DC | PRN
  Administered 2017-04-05: 08:00:00 via INTRAVENOUS

## 2017-04-03 MED ORDER — ACETAMINOPHEN 325 MG PO TABS
650.0000 mg | ORAL_TABLET | Freq: Four times a day (QID) | ORAL | Status: DC | PRN
  Administered 2017-04-06 – 2017-04-16 (×10): 650 mg via ORAL
  Filled 2017-04-03 (×10): qty 2

## 2017-04-03 NOTE — Progress Notes (Signed)
Referring Provider: No ref. provider found Primary Care Physician:  Neale Burly, MD Primary Gastroenterologist:  Dr. Oneida Alar  Date of Admission: 04/03/17 Date of Consultation: 04/03/17  Reason for Consultation:  GI bleed, anemia  HPI:  Cassidy Smith is a 62 y.o. female with a past medical history of appendectomy, bipolar, chronic kidney disease, depression, CVA, DVT on anticoagulation.  The patient was recently admitted in February of this year for fall status post fracture and ORIF on 03/07/2017.  The patient had a baseline hemoglobin of approximately 10 on admission, this declined to about 8 prior to her surgery and she was given 2 units of blood.  Further decline postop for 2 units.  No obvious GI bleeding.  No endoscopic evaluation performed due to no heme positive stool and no obvious bleeding.  She was arranged to follow-up outpatient with GI 2 months after discharge.  Her discharge hemoglobin was 9.8.  She was discharged to skilled nursing facility for rehab.  She presents today from the nursing home with complaints of maroon rectal bleeding for about 1 week but first noticed by staff today.  Admits some weakness, no chest pain, dizziness, shortness of breath.  She admits colonoscopy in the past but is unsure of when, where, or the results.  Her hemoglobin in the emergency department is actually improved compared to discharge hemoglobin; noted to be 10.8 today.  INR normal.  She is currently on Xarelto with her last dose of her blood thinner was yesterday per ER note, proximally 24 hours ago.  She was admitted for GI bleed and we were consulted.  She has not been seen in our office in a number of years.  No record of colonoscopy/endoscopy in our system, although it could be that the procedure was performed several years ago for current EMR or, alternatively, she had this completed at another job provider.  Today she states she has chronic diarrhea. First saw maroon/red stools about a week  ago. Does have some weakness/fatigue but also hasn't been eating well lately. Denies abdominal pain, N/V, melena, GERD, CP, dyspnea. Her right heel is uncomfortable (improved after floating it on a sheet). No other GI complaints. Not sure who/where/when her last colonoscopy was. Her friend who is accompanying her isn't sure if she's ever had one. She is not sure exactly when she last took Xarelto. She is hungry and wanting to eat.  Past Medical History:  Diagnosis Date  . Anxiety   . Bipolar 1 disorder ([place])   . Chronic hip pain   . Chronic kidney disease    uti  currently,  hx bladder spasms  . Depression   . Headache(784.0)   . Neuromuscular disorder (H[facility])    shaking of hands   . Stroke [facility])    mini    Past Surgical History:  Procedure Laterality Date  . INTRAMEDULLARY (IM) NAIL INTERTROCHANTERIC Right 03/07/2017   Procedure: OPEN TREATMENT INTERNAL FIXATION RIGHT HIP WITH GAMA INTRAMEDULARY NAIL;  Surgeon: Carole Civil, MD;  Location: [facility];  Service: Orthopedics;  Laterality: Right;  . MULTIPLE EXTRACTIONS WITH ALVEOLOPLASTY N/A 10/30/2012   Procedure: MULTIPLE EXTRACION 5, 6, 8, 9, 10 ,18, 19, 31 WITH MAXILLARY RIGHT AND LEFT  ALVEOLOPLASTY REDUCE MAXILLARY LEFT TUBEROSITY;  Surgeon: Gae Bon, DDS;  Location: [place];  Service: Oral Surgery;  Laterality: N/A;    Prior to Admission medications   Medication Sig Start Date End Date Taking? Authorizing Provider  aspirin EC 81 MG EC tablet Take  1 tablet (81 mg total) by mouth daily. 05/15/13   Samuella Cota, MD  benztropine (COGENTIN) 1 MG tablet Take 1 mg by mouth 2 (two) times daily.    [provider]  diclofenac sodium (VOLTAREN) 1 % GEL Apply 2 g topically 4 (four) times daily. 03/14/17   Kayleen Memos, DO  divalproex (DEPAKOTE ER) 500 MG 24 hr tablet Take 1,500 mg by mouth at bedtime.     [provider]  feeding supplement, ENSURE ENLIVE, (ENSURE ENLIVE) LIQD Take 237 mLs by mouth 2 (two) times  daily between meals. 03/14/17   Kayleen Memos, DO  ferrous sulfate 325 (65 FE) MG tablet Take 1 tablet (325 mg total) by mouth daily with breakfast. 03/15/17   Kayleen Memos, DO  methimazole (TAPAZOLE) 10 MG tablet Take 10 mg by mouth daily.  12/24/15   [provider]  Multiple Vitamins-Minerals (CENTRUM SILVER ADULT 50+ PO) Take 1 tablet by mouth every morning.    [provider]  pantoprazole (PROTONIX) 40 MG tablet Take 1 tablet (40 mg total) by mouth 2 (two) times daily before a meal. 03/14/17   Hall, Carole N, DO  RISPERDAL CONSTA 50 MG injection Inject 50 mg into the muscle every 14 (fourteen) days. 10/24/14   [provider]  risperidone (RISPERDAL) 4 MG tablet Take 4 mg by mouth at bedtime. 06/22/15   [provider]  Rivaroxaban 15 & 20 MG TBPK Take as directed on package: Start with one 15mg  tablet by mouth twice a day with food. On Day 22, switch to one 20mg  tablet once a day with food. 03/14/17   Kayleen Memos, DO  senna-docusate (SENOKOT-S) 8.6-50 MG tablet Take 1 tablet by mouth 2 (two) times daily. 03/14/17   Kayleen Memos, DO  traZODone (DESYREL) 100 MG tablet Take 200 mg by mouth at bedtime.    [provider]  vitamin B-12 (CYANOCOBALAMIN) 1000 MCG tablet Take 1,000 mcg by mouth daily.    [provider]    Current Facility-Administered Medications  Medication Dose Route Frequency Provider Last Rate Last Dose  . 0.9 %  sodium chloride infusion  250 mL Intravenous PRN Johnson, Clanford L, MD      . acetaminophen (TYLENOL) tablet 650 mg  650 mg Oral Q6H PRN Johnson, Clanford L, MD       Or  . acetaminophen (TYLENOL) suppository 650 mg  650 mg Rectal Q6H PRN Johnson, Clanford L, MD      . benztropine (COGENTIN) tablet 1 mg  1 mg Oral BID Johnson, Clanford L, MD      . divalproex (DEPAKOTE ER) 24 hr tablet 1,500 mg  1,500 mg Oral QHS Johnson, Clanford L, MD      . feeding supplement (ENSURE ENLIVE) (ENSURE ENLIVE) liquid 237 mL  237 mL  Oral BID BM Johnson, Clanford L, MD      . Derrill Memo ON 04/04/2017] ferrous sulfate tablet 325 mg  325 mg Oral Q breakfast Johnson, Clanford L, MD      . methimazole (TAPAZOLE) tablet 10 mg  10 mg Oral Daily Johnson, Clanford L, MD      . nicotine (NICODERM CQ - dosed in mg/24 hours) patch 14 mg  14 mg Transdermal Daily Johnson, Clanford L, MD      . ondansetron (ZOFRAN) tablet 4 mg  4 mg Oral Q6H PRN Johnson, Clanford L, MD       Or  . ondansetron (ZOFRAN) injection 4 mg  4 mg Intravenous Q6H PRN Johnson, Clanford L, MD      . pantoprazole (PROTONIX) EC tablet 40 mg  40 mg Oral BID AC Johnson, Clanford L, MD      . risperiDONE (RISPERDAL) tablet 4 mg  4 mg Oral QHS Johnson, Clanford L, MD      . senna-docusate (Senokot-S) tablet 1 tablet  1 tablet Oral BID Johnson, Clanford L, MD      . sodium chloride flush (NS) 0.9 % injection 3 mL  3 mL Intravenous Q12H Johnson, Clanford L, MD      . sodium chloride flush (NS) 0.9 % injection 3 mL  3 mL Intravenous PRN Johnson, Clanford L, MD      . traZODone (DESYREL) tablet 200 mg  200 mg Oral QHS Johnson, Clanford L, MD      . vitamin B-12 (CYANOCOBALAMIN) tablet 1,000 mcg  1,000 mcg Oral Daily Johnson, Clanford L, MD       Current Outpatient Medications  Medication Sig Dispense Refill  . aspirin EC 81 MG EC tablet Take 1 tablet (81 mg total) by mouth daily.    . benztropine (COGENTIN) 1 MG tablet Take 1 mg by mouth 2 (two) times daily.    . diclofenac sodium (VOLTAREN) 1 % GEL Apply 2 g topically 4 (four) times daily. 2 Tube 0  . divalproex (DEPAKOTE ER) 500 MG 24 hr tablet Take 1,500 mg by mouth at bedtime.     . feeding supplement, ENSURE ENLIVE, (ENSURE ENLIVE) LIQD Take 237 mLs by mouth 2 (two) times daily between meals. 237 mL 0  . ferrous sulfate 325 (65 FE) MG tablet Take 1 tablet (325 mg total) by mouth daily with breakfast. 30 tablet 0  . methimazole (TAPAZOLE) 10 MG tablet Take 10 mg by mouth daily.   0  . Multiple Vitamins-Minerals (CENTRUM  SILVER ADULT 50+ PO) Take 1 tablet by mouth every morning.    . pantoprazole (PROTONIX) 40 MG tablet Take 1 tablet (40 mg total) by mouth 2 (two) times daily before a meal. 60 tablet 0  . RISPERDAL CONSTA 50 MG injection Inject 50 mg into the muscle every 14 (fourteen) days.  0  . risperidone (RISPERDAL) 4 MG tablet Take 4 mg by mouth at bedtime.  0  . Rivaroxaban 15 & 20 MG TBPK Take as directed on package: Start with one 15mg  tablet by mouth twice a day with food. On Day 22, switch to one 20mg  tablet once a day with food. 51 each 0  . senna-docusate (SENOKOT-S) 8.6-50 MG tablet Take 1 tablet by mouth 2 (two) times daily. 30 tablet 0  . traZODone (DESYREL) 100 MG tablet Take 200 mg by mouth at bedtime.    . vitamin B-12 (CYANOCOBALAMIN) 1000 MCG tablet Take 1,000 mcg by mouth daily.      Allergies as of 04/03/2017  . (No Known Allergies)    Family History  Problem Relation Age of Onset  . Breast cancer Mother   . Cancer - Other Mother   . Alcoholism Father   . Colon cancer Neg Hx   . Colon polyps Neg Hx     Social History   Socioeconomic History  . Marital status: Divorced    Spouse name: Not on file  . Number of children: Not on file  . Years of education: Not on file  . Highest education level: Not on file  Occupational History  . Not on file  Social Needs  . Financial resource strain:  Not on file  . Food insecurity:    Worry: Not on file    Inability: Not on file  . Transportation needs:    Medical: Not on file    Non-medical: Not on file  Tobacco Use  . Smoking status: Current Every Day Smoker    Packs/day: 0.50    Years: 20.00    Pack years: 10.00    Types: Cigarettes  . Smokeless tobacco: Never Used  Substance and Sexual Activity  . Alcohol use: No  . Drug use: No  . Sexual activity: Not on file  Lifestyle  . Physical activity:    Days per week: Not on file    Minutes per session: Not on file  . Stress: Not on file  Relationships  . Social connections:     Talks on phone: Not on file    Gets together: Not on file    Attends religious service: Not on file    Active member of club or organization: Not on file    Attends meetings of clubs or organizations: Not on file    Relationship status: Not on file  . Intimate partner violence:    Fear of current or ex partner: Not on file    Emotionally abused: Not on file    Physically abused: Not on file    Forced sexual activity: Not on file  Other Topics Concern  . Not on file  Social History Narrative  . Not on file    Review of Systems: General: Negative for anorexia, weight loss, fever, chills. ENT: Negative for hoarseness, difficulty swallowing , nasal congestion. CV: Negative for chest pain, angina, palpitations, dyspnea on exertion, peripheral edema.  Respiratory: Negative for dyspnea at rest, dyspnea on exertion, cough, sputum, wheezing.  GI: See history of present illness. Derm: Right heel dry/cracking and uncomfortable.  Endo: Negative for unusual weight change.  Heme: Negative for bruising or bleeding. Allergy: Negative for rash or hives.  Physical Exam: Vital signs in last 24 hours: Temp:  [98.1 F (36.7 C)] 98.1 F (36.7 C) (03/21 0954) Pulse Rate:  [84-96] 84 (03/21 1144) Resp:  [18-19] 18 (03/21 1144) BP: (121-129)/(69-88) 121/88 (03/21 1144) SpO2:  [99 %-100 %] 99 % (03/21 1144) Weight:  [112 lb (50.8 kg)] 112 lb (50.8 kg) (03/21 0953)   General:   Alert,  Well-developed, well-nourished, pleasant and cooperative in NAD Head:  Normocephalic and atraumatic. Eyes:  Sclera clear, no icterus. Conjunctiva pink. Ears:  Normal auditory acuity. Nose:  No deformity, discharge, or lesions. Lungs:  Clear throughout to auscultation. No wheezes, crackles, or rhonchi. No acute distress. Heart:  Regular rate and rhythm; no murmurs, clicks, rubs, or gallops. Abdomen:  Soft, and nondistended. Mild generalized abdominal TTP noted. No masses, hepatosplenomegaly or hernias noted.  Normal bowel sounds, without guarding, and without rebound.  Rectal:  Deferred.   Msk:  Symmetrical without gross deformities. Pulses:  Normal bilateral DP pulses noted. Extremities:  Without clubbing or edema. Neurologic:  Alert and  oriented x4;  grossly normal neurologically. Skin:  Intact without significant lesions or rashes. Psych:  Alert and cooperative. Normal mood and affect.  Intake/Output from previous day: No intake/output data recorded. Intake/Output this shift: No intake/output data recorded.  Lab Results: Recent Labs    04/03/17 1031  WBC 7.4  HGB 10.8*  HCT 34.6*  PLT 296   BMET Recent Labs    04/03/17 1031  NA 131*  K 4.1  CL 94*  CO2 29  GLUCOSE  97  BUN 11  CREATININE 0.43*  CALCIUM 8.8*   LFT Recent Labs    04/03/17 1031  PROT 5.8*  ALBUMIN 2.6*  AST 14*  ALT 9*  ALKPHOS 211*  BILITOT 0.5   PT/INR Recent Labs    04/03/17 1031  LABPROT 13.7  INR 1.06   Hepatitis Panel No results for input(s): HEPBSAG, HCVAB, HEPAIGM, HEPBIGM in the last 72 hours. C-Diff No results for input(s): CDIFFTOX in the last 72 hours.  Studies/Results: No results found.  Impression: Pleasant 62 year old female presents on admission from the nursing home for maroon stools for the past week.  She states the nursing facility for saw this morning.  She was recently in the hospital for fractured hip status post ORIF.  She developed DVT while hospitalized and was placed on Xarelto.  She is currently on Xarelto.  She denies overt abdominal pain, although this does she have some generalized tenderness on exam.  She has chronic diarrhea as well.  Denies any melena, unintentional weight loss, fevers.  Her friend who is coming her states she has not been eating well and has not been hungry.  Although today she states she is hungry and would like to eat.  She states she has had a colonoscopy, her friend who is accompanying her is unsure if she has.  Regardless, the patient  is not sure where or when her last colonoscopy was.  There is no documentation of a colonoscopy in our system.  Her hemoglobin is stable and actually improved compared to her last discharge value.  However, this could decline in the ensuing hours.  She has heme card pending collection.  I spoke with the med tech at her nursing home and she last received Xarelto yesterday, 04/02/2017 at 8:00 PM.  She would need to be off this for 48 hours before consideration of endoscopic evaluation.  There may be room for shortening this timeframe if she develops an urgent/emergent onset of bleeding.  There are multiple possible etiologies behind her rectal bleeding.  She certainly could have benign anorectal source with frequent diarrhea which could be worsened with anticoagulation.  There is a possibility of bleeding polyp, bleeding diverticula, inflammatory bowel disease less likely, colorectal cancer less likely.  Regardless, she will likely need a colonoscopy while she is admitted.  Recommend she keep her outpatient evaluation to follow-up as well as discuss her chronic diarrhea symptoms.  Plan: 1. Consider colonoscopy, likely Saturday at the earliest (when she has been off Xarelto for 48 hours) 2. Continue to hold Xarelto for now 3. Start full liquid diet today 4. Monitor further GI bleed 5. Complete heme stool card when able 6. Follow hemoglobins closely 7. Transfuse as necessary 8. Supportive measures   Thank you for allowing [place] to participate in the care of [place], DNP, AGNP-C Adult & Gerontological Nurse Practitioner [facility]    LOS: 0 days     04/03/2017, 12:37 PM

## 2017-04-03 NOTE — ED Provider Notes (Signed)
[facility] EMERGENCY DEPARTMENT Provider Note   CSN: [id number] Arrival date & time: 04/03/17  0940     History   Chief Complaint Chief Complaint  Patient presents with  . Rectal Bleeding    HPI Cassidy Smith is a 62 y.o. female.  Complaint is rectal bleeding  HPI: 62 year old female from [facility] with a complaint of maroon rectal bleeding  [name] on 2/21 and fractured her hip.  She had surgery February 2/22.  She was hospitalized through 3/1.  Course was complicated by right lower extremity DVT for which she was placed on Rivoraxaban/Zarelto.   Staff noted maroon stool this morning.  [name] she has noticed this for several days.  She feels weak this morning.  She is not lightheaded, or syncopal.  States she has had colonoscopy in the past.  Does not remember the results, or when this was performed.  Chest pain shortness of breath.  Past Medical History:  Diagnosis Date  . Anxiety   . Bipolar 1 disorder ([place])   . Chronic hip pain   . Chronic kidney disease    uti  currently,  hx bladder spasms  . Depression   . Headache(784.0)   . Neuromuscular disorder (HCC)    shaking of hands   . Stroke [facility])    mini    Patient Active Problem List   Diagnosis Date Noted  . S/P ORIF (open reduction internal fixation) fracture right hip IM nail 03/07/17 03/24/2017  . Hypomagnesemia 03/07/2017  . Fall   . Closed intertrochanteric fracture of hip, right, initial encounter ([place]) 03/06/2017  . Radial styloid fracture: right 03/06/2017  . Anemia 03/06/2017  . Orthostatic hypotension 06/07/2015  . UTI (urinary tract infection) 06/07/2015  . Rhabdomyolysis 06/07/2015  . White matter abnormality on MRI of brain 05/14/2013  . History of depression 05/14/2013  . Hx of anxiety disorder 05/14/2013  . Bipolar I disorder, most recent episode (or current) manic, unspecified 05/14/2013  . Bipolar 1 disorder ([place]) 05/14/2013  . Hyponatremia 05/12/2013  . Bipolar disorder  ([place]) 05/12/2013  . Lacunar infarct, acute 05/12/2013    Past Surgical History:  Procedure Laterality Date  . INTRAMEDULLARY (IM) NAIL INTERTROCHANTERIC Right 03/07/2017   Procedure: OPEN TREATMENT INTERNAL FIXATION RIGHT HIP WITH GAMA INTRAMEDULARY NAIL;  Surgeon: Carole Civil, MD;  Location: [facility];  Service: Orthopedics;  Laterality: Right;  . MULTIPLE EXTRACTIONS WITH ALVEOLOPLASTY N/A 10/30/2012   Procedure: MULTIPLE EXTRACION 5, 6, 8, 9, 10 ,18, 19, 31 WITH MAXILLARY RIGHT AND LEFT  ALVEOLOPLASTY REDUCE MAXILLARY LEFT TUBEROSITY;  Surgeon: Gae Bon, DDS;  Location: [place];  Service: Oral Surgery;  Laterality: N/A;    OB History   None      Home Medications    Prior to Admission medications   Medication Sig Start Date End Date Taking? Authorizing Provider  aspirin EC 81 MG EC tablet Take 1 tablet (81 mg total) by mouth daily. 05/15/13   Samuella Cota, MD  benztropine (COGENTIN) 1 MG tablet Take 1 mg by mouth 2 (two) times daily.    [provider]  diclofenac sodium (VOLTAREN) 1 % GEL Apply 2 g topically 4 (four) times daily. 03/14/17   Kayleen Memos, DO  divalproex (DEPAKOTE ER) 500 MG 24 hr tablet Take 1,500 mg by mouth at bedtime.     [provider]  feeding supplement, ENSURE ENLIVE, (ENSURE ENLIVE) LIQD Take 237 mLs by mouth 2 (two) times daily between meals.  03/14/17   Kayleen Memos, DO  ferrous sulfate 325 (65 FE) MG tablet Take 1 tablet (325 mg total) by mouth daily with breakfast. 03/15/17   Kayleen Memos, DO  methimazole (TAPAZOLE) 10 MG tablet Take 10 mg by mouth daily.  12/24/15   [provider]  Multiple Vitamins-Minerals (CENTRUM SILVER ADULT 50+ PO) Take 1 tablet by mouth every morning.    [provider]  pantoprazole (PROTONIX) 40 MG tablet Take 1 tablet (40 mg total) by mouth 2 (two) times daily before a meal. 03/14/17   Hall, Carole N, DO  RISPERDAL CONSTA 50 MG injection Inject 50 mg into the muscle every 14  (fourteen) days. 10/24/14   [provider]  risperidone (RISPERDAL) 4 MG tablet Take 4 mg by mouth at bedtime. 06/22/15   [provider]  Rivaroxaban 15 & 20 MG TBPK Take as directed on package: Start with one 15mg  tablet by mouth twice a day with food. On Day 22, switch to one 20mg  tablet once a day with food. 03/14/17   Kayleen Memos, DO  senna-docusate (SENOKOT-S) 8.6-50 MG tablet Take 1 tablet by mouth 2 (two) times daily. 03/14/17   Kayleen Memos, DO  traZODone (DESYREL) 100 MG tablet Take 200 mg by mouth at bedtime.    [provider]  vitamin B-12 (CYANOCOBALAMIN) 1000 MCG tablet Take 1,000 mcg by mouth daily.    [provider]    Family History Family History  Problem Relation Age of Onset  . Breast cancer Mother   . Cancer - Other Mother   . Alcoholism Father   . Colon cancer Neg Hx   . Colon polyps Neg Hx     Social History Social History   Tobacco Use  . Smoking status: Current Every Day Smoker    Packs/day: 0.50    Years: 20.00    Pack years: 10.00    Types: Cigarettes  . Smokeless tobacco: Never Used  Substance Use Topics  . Alcohol use: No  . Drug use: No     Allergies   Patient has no known allergies.   Review of Systems Review of Systems  Constitutional: Negative for appetite change, chills, diaphoresis, fatigue and fever.  HENT: Negative for mouth sores, sore throat and trouble swallowing.   Eyes: Negative for visual disturbance.  Respiratory: Negative for cough, chest tightness, shortness of breath and wheezing.   Cardiovascular: Negative for chest pain.  Gastrointestinal: Positive for blood in stool. Negative for abdominal distention, abdominal pain, diarrhea, nausea and vomiting.  Endocrine: Negative for polydipsia, polyphagia and polyuria.  Genitourinary: Negative for dysuria, frequency and hematuria.  Musculoskeletal: Negative for gait problem.  Skin: Negative for color change, pallor and rash.  Neurological:  Negative for dizziness, syncope, light-headedness and headaches.  Hematological: Does not bruise/bleed easily.  Psychiatric/Behavioral: Negative for behavioral problems and confusion.     Physical Exam Updated Vital Signs BP 129/69   Pulse 96   Temp 98.1 F (36.7 C) (Oral)   Resp 19   Ht 5\' 6"  (1.676 m)   Wt 50.8 kg (112 lb)   SpO2 100%   BMI 18.08 kg/m   Physical Exam  Constitutional: She is oriented to person, place, and time. She appears well-developed and well-nourished. No distress.  HENT:  Head: Normocephalic.  Conjunctive are pink, not pale.  Eyes: Pupils are equal, round, and reactive to light. Conjunctivae are normal. No scleral icterus.  Neck: Normal range of motion. Neck supple. No thyromegaly  present.  Cardiovascular: Normal rate and regular rhythm. Exam reveals no gallop and no friction rub.  No murmur heard. Pulmonary/Chest: Effort normal and breath sounds normal. No respiratory distress. She has no wheezes. She has no rales.  Abdominal: Soft. Bowel sounds are normal. She exhibits no distension. There is no tenderness. There is no rebound.  Abdomen soft.  Nontender.  Genitourinary:  Genitourinary Comments: Perirectal exam appears normal without hemorrhoid or fissure.  Maroon stool on rectal exam  Musculoskeletal: Normal range of motion.  Neurological: She is alert and oriented to person, place, and time.  Skin: Skin is warm and dry. No rash noted.  Psychiatric: She has a normal mood and affect. Her behavior is normal.     ED Treatments / Results  Labs (all labs ordered are listed, but only abnormal results are displayed) Labs Reviewed  CBC WITH DIFFERENTIAL/PLATELET - Abnormal; Notable for the following components:      Result Value   RBC 3.68 (*)    Hemoglobin 10.8 (*)    HCT 34.6 (*)    RDW 20.6 (*)    All other components within normal limits  COMPREHENSIVE METABOLIC PANEL - Abnormal; Notable for the following components:   Sodium 131 (*)     Chloride 94 (*)    Creatinine, Ser 0.43 (*)    Calcium 8.8 (*)    Total Protein 5.8 (*)    Albumin 2.6 (*)    AST 14 (*)    ALT 9 (*)    Alkaline Phosphatase 211 (*)    All other components within normal limits  PROTIME-INR  I-STAT CG4 LACTIC ACID, ED  TYPE AND SCREEN    EKG  EKG Interpretation None       Radiology No results found.  Procedures Procedures (including critical care time)  Medications Ordered in ED Medications - No data to display   Initial Impression / Assessment and Plan / ED Course  I have reviewed the triage vital signs and the nursing notes.  Pertinent labs & imaging results that were available during my care of the patient were reviewed by me and considered in my medical decision making (see chart for details).   Patient is heme dynamically stable.  Hemoglobin is 10.  She received blood transfusion while in the hospital for hemoglobin of 6.8.  Studies at that time showed normocytic anemia.  Normal iron, B12 elevated, normal folate.  Impression is that this may be simply occult GI bleeding leading to her previous anemia now exacerbated by anticoagulation.  Her last Xarelto was yesterday.  I have not given reversal as she is stable and greater than 24 hours from last dose of anticoagulation.  Will discuss with hospitalist, as well as GI physician.  Final Clinical Impressions(s) / ED Diagnoses   Final diagnoses:  Rectal bleeding    ED Discharge Orders    None       Tanna Furry, MD 04/03/17 1135

## 2017-04-03 NOTE — ED Triage Notes (Signed)
Pt sent from [place] for upper GI bleed. Pt states that she is passing dark red clots.

## 2017-04-03 NOTE — H&P (Addendum)
History and Physical  Cassidy Smith NFA:[id number] DOB: February 16, 1955 DOA: 04/03/2017  Referring physician: Jeneen Smith PCP: Cassidy Burly, MD   Chief Complaint: Rectal Bleeding  HPI: Cassidy Smith is a 62 y.o. female  from [place] [facility] taking xarelto daily for anticoagulation for DVT treatment presented to ED with a complaint of maroon rectal bleeding that has been present for about a week per patient but noticed by staff today and she was sent to ED for further evaluation. The patient has some weakness but otherwise no chest pain or SOB symptoms.  She has noticed the maroon stools however.    Cassidy Smith was recently hospitalized at [facility].  Cassidy Smith fell on 2/21 and fractured her hip.  She had surgery February 2/22 by Dr. Aline Smith.  She was hospitalized through 3/1.   Her hospital course was complicated by right lower extremity DVT for which she was placed on rivoraxaban.    The nursing home staff noted maroon stool this morning.  Cassidy Smith states she has noticed this for several days.  She feels weak this morning.  She is not lightheaded, or syncopal.  States she has had colonoscopy in the past.  Does not remember the results, or when this was performed.  ED Course: her hemoglobin is actually improved from when she was discharged last month.  She is hemoccult positive.  Her vitals are stable.  Her last dose of xarelto was yesterday.  She is being admitted for a GI bleed.    Review of Systems: All systems reviewed and apart from history of presenting illness, are negative.  Past Medical History:  Diagnosis Date  . Anxiety   . Bipolar 1 disorder ([place])   . Chronic hip pain   . Chronic kidney disease    uti  currently,  hx bladder spasms  . Depression   . Headache(784.0)   . Hyperthyroidism   . Neuromuscular disorder (HCC)    shaking of hands   . Stroke [facility])    mini   Past Surgical History:  Procedure Laterality Date  . INTRAMEDULLARY (IM) NAIL INTERTROCHANTERIC  Right 03/07/2017   Procedure: OPEN TREATMENT INTERNAL FIXATION RIGHT HIP WITH GAMA INTRAMEDULARY NAIL;  Surgeon: Carole Civil, MD;  Location: [facility];  Service: Orthopedics;  Laterality: Right;  . MULTIPLE EXTRACTIONS WITH ALVEOLOPLASTY N/A 10/30/2012   Procedure: MULTIPLE EXTRACION 5, 6, 8, 9, 10 ,18, 19, 31 WITH MAXILLARY RIGHT AND LEFT  ALVEOLOPLASTY REDUCE MAXILLARY LEFT TUBEROSITY;  Surgeon: Gae Bon, DDS;  Location: [place];  Service: Oral Surgery;  Laterality: N/A;   Social History:  reports that she has been smoking cigarettes.  She has a 10.00 pack-year smoking history. She has never used smokeless tobacco. She reports that she does not drink alcohol or use drugs.  No Known Allergies  Family History  Problem Relation Age of Onset  . Breast cancer Mother   . Cancer - Other Mother   . Alcoholism Father   . Colon cancer Neg Hx   . Colon polyps Neg Hx     Prior to Admission medications   Medication Sig Start Date End Date Taking? Authorizing Provider  aspirin EC 81 MG EC tablet Take 1 tablet (81 mg total) by mouth daily. 05/15/13   Samuella Cota, MD  benztropine (COGENTIN) 1 MG tablet Take 1 mg by mouth 2 (two) times daily.    [provider]  diclofenac sodium (VOLTAREN) 1 % GEL Apply 2 g topically 4 (  four) times daily. 03/14/17   Kayleen Memos, DO  divalproex (DEPAKOTE ER) 500 MG 24 hr tablet Take 1,500 mg by mouth at bedtime.     [provider]  feeding supplement, ENSURE ENLIVE, (ENSURE ENLIVE) LIQD Take 237 mLs by mouth 2 (two) times daily between meals. 03/14/17   Kayleen Memos, DO  ferrous sulfate 325 (65 FE) MG tablet Take 1 tablet (325 mg total) by mouth daily with breakfast. 03/15/17   Kayleen Memos, DO  methimazole (TAPAZOLE) 10 MG tablet Take 10 mg by mouth daily.  12/24/15   [provider]  Multiple Vitamins-Minerals (CENTRUM SILVER ADULT 50+ PO) Take 1 tablet by mouth every morning.    [provider]  pantoprazole  (PROTONIX) 40 MG tablet Take 1 tablet (40 mg total) by mouth 2 (two) times daily before a meal. 03/14/17   Hall, Carole N, DO  RISPERDAL CONSTA 50 MG injection Inject 50 mg into the muscle every 14 (fourteen) days. 10/24/14   [provider]  risperidone (RISPERDAL) 4 MG tablet Take 4 mg by mouth at bedtime. 06/22/15   [provider]  Rivaroxaban 15 & 20 MG TBPK Take as directed on package: Start with one 15mg  tablet by mouth twice a day with food. On Day 22, switch to one 20mg  tablet once a day with food. 03/14/17   Kayleen Memos, DO  senna-docusate (SENOKOT-S) 8.6-50 MG tablet Take 1 tablet by mouth 2 (two) times daily. 03/14/17   Kayleen Memos, DO  traZODone (DESYREL) 100 MG tablet Take 200 mg by mouth at bedtime.    [provider]  vitamin B-12 (CYANOCOBALAMIN) 1000 MCG tablet Take 1,000 mcg by mouth daily.    [provider]   Physical Exam: Vitals:   04/03/17 0954 04/03/17 1144 04/03/17 1250 04/03/17 1342  BP: 129/69 121/88 (!) 155/89 (!) 144/79  Pulse: 96 84 84 80  Resp: 19 18 18 18   Temp: 98.1 F (36.7 C)   97.7 F (36.5 C)  TempSrc: Oral   Oral  SpO2: 100% 99% 97% 98%  Weight:    51.2 kg (112 lb 14 oz)  Height:    5\' 6"  (1.676 m)     General exam: Moderately built and nourished patient, lying comfortably supine on the gurney in no obvious distress.  Head, eyes and ENT: Nontraumatic and normocephalic. Pupils equally reacting to light and accommodation. Oral mucosa moist.  Neck: Supple. No JVD, carotid bruit or thyromegaly.  Lymphatics: No lymphadenopathy.  Respiratory system: Clear to auscultation. No increased work of breathing.  Cardiovascular system: S1 and S2 heard, RRR. No JVD, murmurs, gallops, clicks or pedal edema.  Gastrointestinal system: Abdomen is nondistended, soft and nontender. Normal bowel sounds heard. No organomegaly or masses appreciated.  Central nervous system: Alert and oriented. No focal neurological  deficits.  Extremities: Symmetric 5 x 5 power. Peripheral pulses symmetrically felt.   Skin: No rashes or acute findings.  Musculoskeletal system: Negative exam.  Psychiatry: Pleasant and cooperative.  Labs on Admission:  Basic Metabolic Panel: Recent Labs  Lab 04/03/17 1031 04/03/17 1042  NA 131*  --   K 4.1  --   CL 94*  --   CO2 29  --   GLUCOSE 97  --   BUN 11  --   CREATININE 0.43*  --   CALCIUM 8.8*  --   MG  --  1.4*   Liver Function Tests: Recent Labs  Lab 04/03/17 1031  AST 14*  ALT 9*  ALKPHOS 211*  BILITOT 0.5  PROT 5.8*  ALBUMIN 2.6*   No results for input(s): LIPASE, AMYLASE in the last 168 hours. No results for input(s): AMMONIA in the last 168 hours. CBC: Recent Labs  Lab 04/03/17 1031  WBC 7.4  NEUTROABS 4.4  HGB 10.8*  HCT 34.6*  MCV 94.0  PLT 296   Cardiac Enzymes: No results for input(s): CKTOTAL, CKMB, CKMBINDEX, TROPONINI in the last 168 hours.  BNP (last 3 results) No results for input(s): PROBNP in the last 8760 hours. CBG: No results for input(s): GLUCAP in the last 168 hours.  Radiological Exams on Admission: No results found.  EKG: Independently reviewed.   Assessment/Plan Principal Problem:   GI bleed Active Problems:   Hyponatremia   History of depression   Hx of anxiety disorder   Orthostatic hypotension   Hypomagnesemia   S/P ORIF (open reduction internal fixation) fracture right hip IM nail 03/07/17   Anticoagulated   Stool bloody   Current every day smoker   Rectal bleeding   Chronic diarrhea   Hyperthyroidism  1. GI bleeding on full dose anticoagulation -- Pt having maroon colored stools.  She is hemodynamically stable and Hg is improved from discharge 1 month ago.  Will admit for further monitoring.  GI consult requested.  Type and screen.  Hold all blood thinner medications until GI can evaluate and give clearance for restarting them.   2. Hyponatremia - this is chronic and stable, will follow with  daily BMP testing.  3. Acute blood loss anemia - stable Hg, will follow with daily CBC testing.  4. DVT from recent hip surgery - Holding Xarelto at this time.   5. Current tobacco use - no smoking in hospital, counseled on cessation and nicotine patches ordered.  6. Hyperthyroidism - continue tapazole.   DVT Prophylaxis: SCDs Code Status: Full   Family Communication:   Disposition Plan: Return to SNF when medically stabilized   Time spent: 36 mins  Irwin Brakeman, MD [facility] Pager [phone]  If 7PM-7AM, please contact night-coverage [email or web address] Password [facility] 04/03/2017, 4:06 PM

## 2017-04-04 DIAGNOSIS — L8990 Pressure ulcer of unspecified site, unspecified stage: Secondary | ICD-10-CM | POA: Diagnosis present

## 2017-04-04 LAB — CBC
HCT: 33.6 % — ABNORMAL LOW (ref 36.0–46.0)
Hemoglobin: 10.7 g/dL — ABNORMAL LOW (ref 12.0–15.0)
MCH: 29.2 pg (ref 26.0–34.0)
MCHC: 31.8 g/dL (ref 30.0–36.0)
MCV: 91.6 fL (ref 78.0–100.0)
PLATELETS: 299 10*3/uL (ref 150–400)
RBC: 3.67 MIL/uL — AB (ref 3.87–5.11)
RDW: 19.5 % — AB (ref 11.5–15.5)
WBC: 9.2 10*3/uL (ref 4.0–10.5)

## 2017-04-04 LAB — BASIC METABOLIC PANEL
Anion gap: 6 (ref 5–15)
BUN: 9 mg/dL (ref 6–20)
CALCIUM: 9.3 mg/dL (ref 8.9–10.3)
CO2: 30 mmol/L (ref 22–32)
CREATININE: 0.42 mg/dL — AB (ref 0.44–1.00)
Chloride: 93 mmol/L — ABNORMAL LOW (ref 101–111)
GFR calc Af Amer: >60 mL/min (ref >60)
GLUCOSE: 82 mg/dL (ref 65–99)
Potassium: 4.3 mmol/L (ref 3.5–5.1)
SODIUM: 129 mmol/L — AB (ref 135–145)

## 2017-04-04 LAB — PROTIME-INR
INR: 1.04
PROTHROMBIN TIME: 13.5 s (ref 11.4–15.2)

## 2017-04-04 LAB — MAGNESIUM: Magnesium: 1.6 mg/dL — ABNORMAL LOW (ref 1.7–2.4)

## 2017-04-04 LAB — MRSA PCR SCREENING: MRSA BY PCR: NEGATIVE

## 2017-04-04 LAB — APTT: aPTT: 29 seconds (ref 24–36)

## 2017-04-04 MED ORDER — PEG 3350-KCL-NA BICARB-NACL 420 G PO SOLR
4000.0000 mL | Freq: Once | ORAL | Status: AC
  Administered 2017-04-05: 4000 mL via ORAL

## 2017-04-04 MED ORDER — BISACODYL 5 MG PO TBEC
5.0000 mg | DELAYED_RELEASE_TABLET | Freq: Every day | ORAL | Status: DC | PRN
Start: 2017-04-05 — End: 2017-04-17

## 2017-04-04 MED ORDER — PEG 3350-KCL-NA BICARB-NACL 420 G PO SOLR
2000.0000 mL | Freq: Once | ORAL | Status: AC
  Administered 2017-04-04: 2000 mL via ORAL
  Filled 2017-04-04: qty 4000

## 2017-04-04 NOTE — Progress Notes (Signed)
Subjective: Patient states that she is doing well overall.  Very mild abdominal tenderness on exam, but no overt pain.  Had a bowel movement last night and it was still with red blood.  Denies persistent nausea and vomiting.  She is complaining of "choking" every now and then.  She denies solid food dysphagia, states she might have started to feel nauseous when she was coughing up mucus.  She admits persistent sinus allergies.  No other GI complaints at this time.  Objective: Vital signs in last 24 hours: Temp:  [97.7 F (36.5 C)-98.1 F (36.7 C)] 98 F (36.7 C) (03/22 0527) Pulse Rate:  [76-96] 76 (03/22 0527) Resp:  [18-20] 20 (03/22 0527) BP: (121-155)/(69-89) 123/73 (03/22 0527) SpO2:  [92 %-100 %] 92 % (03/22 0527) Weight:  [112 lb (50.8 kg)-112 lb 14 oz (51.2 kg)] 112 lb 14 oz (51.2 kg) (03/21 1342) Last BM Date: 04/03/17 General:   Alert and oriented, pleasant Head:  Normocephalic and atraumatic. Eyes:  No icterus, sclera clear. Conjuctiva pink.  Heart:  S1, S2 present, no murmurs noted.  Lungs: Clear to auscultation bilaterally, without wheezing, rales, or rhonchi.  Abdomen:  Bowel sounds present, soft, non-distended. Minimal to mild TTP. No HSM or hernias noted. No rebound or guarding. No masses appreciated  Msk:  Symmetrical without gross deformities. Pulses:  Normal bilateral DP pulses noted. Extremities:  Without clubbing or edema. Neurologic:  Alert and  oriented x4;  grossly normal neurologically. Skin:  Warm and dry, intact without significant lesions.  Psych:  Alert and cooperative. Normal mood and affect.  Intake/Output from previous day: 03/21 0701 - 03/22 0700 In: 583 [P.O.:480; I.V.:3; IV Piggyback:100] Out: 1000 [Urine:1000] Intake/Output this shift: No intake/output data recorded.  Lab Results: Recent Labs    04/03/17 1031 04/04/17 0456  WBC 7.4 9.2  HGB 10.8* 10.7*  HCT 34.6* 33.6*  PLT 296 299   BMET Recent Labs    04/03/17 1031  04/04/17 0456  NA 131* 129*  K 4.1 4.3  CL 94* 93*  CO2 29 30  GLUCOSE 97 82  BUN 11 9  CREATININE 0.43* 0.42*  CALCIUM 8.8* 9.3   LFT Recent Labs    04/03/17 1031  PROT 5.8*  ALBUMIN 2.6*  AST 14*  ALT 9*  ALKPHOS 211*  BILITOT 0.5   PT/INR Recent Labs    04/03/17 1031 04/04/17 0456  LABPROT 13.7 13.5  INR 1.06 1.04   Hepatitis Panel No results for input(s): HEPBSAG, HCVAB, HEPAIGM, HEPBIGM in the last 72 hours.   Studies/Results: No results found.  Assessment: Pleasant 62 year old female presents on admission from the nursing home for maroon stools for the past week.  She states the nursing facility for saw this morning.  She was recently in the hospital for fractured hip status post ORIF.  She developed DVT while hospitalized and was placed on Xarelto.  She is currently on Xarelto.  She denies overt abdominal pain, although this does she have some generalized tenderness on exam.  She has chronic diarrhea as well.  Denies any melena, unintentional weight loss, fevers.  Her friend who is coming her states she has not been eating well and has not been hungry.  Although today she states she is hungry and would like to eat.  She states she has had a colonoscopy, her friend who is accompanying her is unsure if she has.  Regardless, the patient is not sure where or when her last colonoscopy was.  There  is no documentation of a colonoscopy in our system.  Her hemoglobin is stable and actually improved compared to her last discharge value.  However, this could decline in the ensuing hours.  She has heme card pending collection.  I spoke with the med tech at her nursing home and she last received Xarelto yesterday, 04/02/2017 at 8:00 PM.  She would need to be off this for 48 hours before consideration of endoscopic evaluation.  There may be room for shortening this timeframe if she develops an urgent/emergent onset of bleeding.  There are multiple possible etiologies behind her  rectal bleeding.  She certainly could have benign anorectal source with frequent diarrhea which could be worsened with anticoagulation.  There is a possibility of bleeding polyp, bleeding diverticula, inflammatory bowel disease less likely, colorectal cancer less likely.  Regardless, she will likely need a colonoscopy while she is admitted.  Recommend she keep her outpatient evaluation to follow-up as well as discuss her chronic diarrhea symptoms. She was offered TCS +/- EGD no sooner than Saturday 04/05/17 and she is agreeable.  Today he hgb is stable at 10.7.  She was doing well overall.  Spoke with the nurse she stated the night nurse saw maroon color, likely blood, on the patient's bowel movement last night but was not aware of heme test and so it was not collected.  The patient denies overt symptoms.  She does have some choking which I feel is likely sinus drainage which potentially caused some nausea this morning.  No overt and persistent nausea and vomiting.  She denies solid food dysphagia.  However, the plan is for possible EGD in addition to her colonoscopy tomorrow and this could shed light to ensure there is no stricture, web, ring.     Plan: 1. Can you with the plan for colonoscopy plus minus EGD tomorrow with Dr. Melony Overly. 2. Monitor H&H closely. 3. Monitor for persistent, worsening, overt GI bleeding. 4. Supportive measures. 5. Transfuse as necessary.   Thank you for allowing [place] to participate in the care of [name], DNP, AGNP-C Adult & Gerontological Nurse Practitioner [facility]    LOS: 1 day    04/04/2017, 8:27 AM

## 2017-04-04 NOTE — Progress Notes (Signed)
PROGRESS NOTE    Cassidy Smith  ZGY:[id number] DOB: 11/16/1955 DOA: 04/03/2017 PCP: Neale Burly, MD    Brief Narrative:  62 year old female admitted from nursing facility with rectal bleeding.  She recently had a hip fracture and developed a DVT for which she was started on anticoagulation.  Hemoglobin has been stable.  Anticoagulation held on admission.  GI is following and plans on endoscopic evaluation on 3/23.   Assessment & Plan:   Principal Problem:   GI bleed Active Problems:   Hyponatremia   History of depression   Hx of anxiety disorder   Orthostatic hypotension   Hypomagnesemia   S/P ORIF (open reduction internal fixation) fracture right hip IM nail 03/07/17   Anticoagulated   Stool bloody   Current every day smoker   Rectal bleeding   Chronic diarrhea   Hyperthyroidism   Pressure injury of skin   1. GI bleeding.  Patient presents to the hospital upon colored stools.  She was recently started on Xarelto for right lower extremity DVT.  Hemoglobin appears to be stable.  GI is following and plans on endoscopic evaluation in a.m. 2. Anemia.  Hemoglobin stable since last discharge.  Continue to follow. 3. Hyponatremia.  Chronic and stable. 4. DVT from recent hip surgery.  Xarelto currently on hold.  Resume as felt feasible by GI. 5. Hyperthyroidism.  Continue on Tapazole   DVT prophylaxis: None.  No pharmacologic DVT prophylaxis since there is concern for GI bleeding.  Since she already has underlying DVT, would avoid SCDs Code Status: Full code Family Communication: No family present Disposition Plan: Discharge home once GI workup is complete   Consultants:   Gastroenterology  Procedures:     Antimicrobials:      Subjective: Reports had a bowel movement today that was nonbloody.  No abdominal pain.  Feels generally weak.  Objective: Vitals:   04/03/17 1250 04/03/17 1342 04/04/17 0527 04/04/17 1541  BP: (!) 155/89 (!) 144/79 123/73 (!) 102/58    Pulse: 84 80 76 76  Resp: 18 18 20 16   Temp:  97.7 F (36.5 C) 98 F (36.7 C) 98.3 F (36.8 C)  TempSrc:  Oral Oral Oral  SpO2: 97% 98% 92% 96%  Weight:  51.2 kg (112 lb 14 oz)    Height:  5\' 6"  (1.676 m)      Intake/Output Summary (Last 24 hours) at 04/04/2017 1728 Last data filed at 04/04/2017 0600 Gross per 24 hour  Intake 583 ml  Output 1000 ml  Net -417 ml   Filed Weights   04/03/17 0953 04/03/17 1342  Weight: 50.8 kg (112 lb) 51.2 kg (112 lb 14 oz)    Examination:  General exam: Appears calm and comfortable  Respiratory system: Clear to auscultation. Respiratory effort normal. Cardiovascular system: S1 & S2 heard, RRR. No JVD, murmurs, rubs, gallops or clicks. No pedal edema. Gastrointestinal system: Abdomen is nondistended, soft and nontender. No organomegaly or masses felt. Normal bowel sounds heard. Central nervous system: Alert and oriented. No focal neurological deficits. Extremities: Symmetric 5 x 5 power. Skin: No rashes, lesions or ulcers Psychiatry: Judgement and insight appear normal. Mood & affect appropriate.     Data Reviewed: I have personally reviewed following labs and imaging studies  CBC: Recent Labs  Lab 04/03/17 1031 04/04/17 0456  WBC 7.4 9.2  NEUTROABS 4.4  --   HGB 10.8* 10.7*  HCT 34.6* 33.6*  MCV 94.0 91.6  PLT 296 591   Basic Metabolic Panel: Recent  Labs  Lab 04/03/17 1031 04/03/17 1042 04/04/17 0456  NA 131*  --  129*  K 4.1  --  4.3  CL 94*  --  93*  CO2 29  --  30  GLUCOSE 97  --  82  BUN 11  --  9  CREATININE 0.43*  --  0.42*  CALCIUM 8.8*  --  9.3  MG  --  1.4* 1.6*   GFR: Estimated Creatinine Clearance: 58.2 mL/min (A) (by C-G formula based on SCr of 0.42 mg/dL (L)). Liver Function Tests: Recent Labs  Lab 04/03/17 1031  AST 14*  ALT 9*  ALKPHOS 211*  BILITOT 0.5  PROT 5.8*  ALBUMIN 2.6*   No results for input(s): LIPASE, AMYLASE in the last 168 hours. No results for input(s): AMMONIA in the last 168  hours. Coagulation Profile: Recent Labs  Lab 04/03/17 1031 04/04/17 0456  INR 1.06 1.04   Cardiac Enzymes: No results for input(s): CKTOTAL, CKMB, CKMBINDEX, TROPONINI in the last 168 hours. BNP (last 3 results) No results for input(s): PROBNP in the last 8760 hours. HbA1C: No results for input(s): HGBA1C in the last 72 hours. CBG: No results for input(s): GLUCAP in the last 168 hours. Lipid Profile: No results for input(s): CHOL, HDL, LDLCALC, TRIG, CHOLHDL, LDLDIRECT in the last 72 hours. Thyroid Function Tests: No results for input(s): TSH, T4TOTAL, FREET4, T3FREE, THYROIDAB in the last 72 hours. Anemia Panel: No results for input(s): VITAMINB12, FOLATE, FERRITIN, TIBC, IRON, RETICCTPCT in the last 72 hours. Sepsis Labs: Recent Labs  Lab 04/03/17 1039  LATICACIDVEN 0.75    Recent Results (from the past 240 hour(s))  MRSA PCR Screening     Status: None   Collection Time: 04/04/17 12:03 AM  Result Value Ref Range Status   MRSA by PCR NEGATIVE NEGATIVE Final    Comment:        The GeneXpert MRSA Assay (FDA approved for NASAL specimens only), is one component of a comprehensive MRSA colonization surveillance program. It is not intended to diagnose MRSA infection nor to guide or monitor treatment for MRSA infections. Performed at [facility], [address], [place], [place] [address]          Radiology Studies: No results found.      Scheduled Meds: . benztropine  1 mg Oral BID  . divalproex  1,500 mg Oral QHS  . feeding supplement (ENSURE ENLIVE)  237 mL Oral BID BM  . ferrous sulfate  325 mg Oral Q breakfast  . methimazole  10 mg Oral Daily  . nicotine  14 mg Transdermal Daily  . pantoprazole  40 mg Oral BID AC  . polyethylene glycol-electrolytes  2,000 mL Oral Once  . [START ON 04/05/2017] polyethylene glycol-electrolytes  4,000 mL Oral Once  . risperiDONE  4 mg Oral QHS  . senna-docusate  1 tablet Oral BID  . sodium chloride flush  3 mL  Intravenous Q12H  . traZODone  200 mg Oral QHS  . vitamin B-12  1,000 mcg Oral Daily   Continuous Infusions: . sodium chloride       LOS: 1 day    Time spent: 41mins    Kathie Dike, MD [facility] Pager [phone]  If 7PM-7AM, please contact night-coverage [email or web address] Password [facility] 04/04/2017, 5:28 PM

## 2017-04-05 ENCOUNTER — Inpatient Hospital Stay (HOSPITAL_COMMUNITY): Payer: Medicaid Other | Admitting: Anesthesiology

## 2017-04-05 ENCOUNTER — Other Ambulatory Visit: Payer: Self-pay

## 2017-04-05 ENCOUNTER — Inpatient Hospital Stay (HOSPITAL_COMMUNITY): Payer: Medicaid Other

## 2017-04-05 ENCOUNTER — Encounter (HOSPITAL_COMMUNITY)
Admission: EM | Disposition: A | Discharge status: Skilled Nursing Facility | Payer: Self-pay | Source: Home / Self Care | Attending: Internal Medicine

## 2017-04-05 ENCOUNTER — Encounter (HOSPITAL_COMMUNITY): Payer: Self-pay | Admitting: *Deleted

## 2017-04-05 DIAGNOSIS — D127 Benign neoplasm of rectosigmoid junction: Secondary | ICD-10-CM

## 2017-04-05 DIAGNOSIS — K644 Residual hemorrhoidal skin tags: Secondary | ICD-10-CM

## 2017-04-05 DIAGNOSIS — K625 Hemorrhage of anus and rectum: Secondary | ICD-10-CM

## 2017-04-05 DIAGNOSIS — K621 Rectal polyp: Secondary | ICD-10-CM

## 2017-04-05 DIAGNOSIS — K6389 Other specified diseases of intestine: Secondary | ICD-10-CM

## 2017-04-05 HISTORY — PX: COLONOSCOPY WITH PROPOFOL: SHX5780

## 2017-04-05 HISTORY — PX: POLYPECTOMY: SHX5525

## 2017-04-05 LAB — BASIC METABOLIC PANEL
Anion gap: 8 (ref 5–15)
BUN: 10 mg/dL (ref 6–20)
CALCIUM: 9.4 mg/dL (ref 8.9–10.3)
CO2: 26 mmol/L (ref 22–32)
CREATININE: 0.43 mg/dL — AB (ref 0.44–1.00)
Chloride: 90 mmol/L — ABNORMAL LOW (ref 101–111)
GFR calc Af Amer: >60 mL/min (ref >60)
GFR calc non Af Amer: >60 mL/min (ref >60)
GLUCOSE: 104 mg/dL — AB (ref 65–99)
Potassium: 4.1 mmol/L (ref 3.5–5.1)
Sodium: 124 mmol/L — ABNORMAL LOW (ref 135–145)

## 2017-04-05 LAB — CBC
HEMATOCRIT: 34.5 % — AB (ref 36.0–46.0)
Hemoglobin: 11 g/dL — ABNORMAL LOW (ref 12.0–15.0)
MCH: 29.2 pg (ref 26.0–34.0)
MCHC: 31.9 g/dL (ref 30.0–36.0)
MCV: 91.5 fL (ref 78.0–100.0)
Platelets: 264 10*3/uL (ref 150–400)
RBC: 3.77 MIL/uL — ABNORMAL LOW (ref 3.87–5.11)
RDW: 19.1 % — AB (ref 11.5–15.5)
WBC: 8.9 10*3/uL (ref 4.0–10.5)

## 2017-04-05 LAB — TSH: TSH: 0.741 u[IU]/mL (ref 0.350–4.500)

## 2017-04-05 LAB — OSMOLALITY: OSMOLALITY: 268 mosm/kg — AB (ref 275–295)

## 2017-04-05 SURGERY — COLONOSCOPY WITH PROPOFOL
Anesthesia: Monitor Anesthesia Care

## 2017-04-05 MED ORDER — PROPOFOL 10 MG/ML IV BOLUS
INTRAVENOUS | Status: AC
  Filled 2017-04-05: qty 40

## 2017-04-05 MED ORDER — PROPOFOL 500 MG/50ML IV EMUL
INTRAVENOUS | Status: DC | PRN
  Administered 2017-04-05: 75 ug/kg/min via INTRAVENOUS

## 2017-04-05 MED ORDER — EPHEDRINE SULFATE 50 MG/ML IJ SOLN
INTRAMUSCULAR | Status: DC | PRN
  Administered 2017-04-05 (×2): 10 mg via INTRAVENOUS

## 2017-04-05 MED ORDER — MIDAZOLAM HCL 5 MG/5ML IJ SOLN
INTRAMUSCULAR | Status: DC | PRN
Start: 2017-04-05 — End: 2017-04-05
  Administered 2017-04-05: 2 mg via INTRAVENOUS

## 2017-04-05 MED ORDER — SODIUM CHLORIDE 0.9 % IV SOLN
INTRAVENOUS | Status: DC
  Administered 2017-04-05: 03:00:00 via INTRAVENOUS

## 2017-04-05 MED ORDER — MIDAZOLAM HCL 2 MG/2ML IJ SOLN
INTRAMUSCULAR | Status: AC
  Filled 2017-04-05: qty 2

## 2017-04-05 NOTE — Op Note (Signed)
[facility] Patient Name: Cassidy Smith Procedure Date: 04/05/2017 7:19 AM MRN: [id number] Date of Birth: Mar 02, 1955 Attending MD: Hildred Laser , MD CSN: [id number] Age: 62 Admit Type: Inpatient Procedure:                Colonoscopy Indications:              Rectal bleeding Providers:                Hildred Laser, MD, Lurline Del, RN, Charlsie Quest                            Theda Sers RN, RN Referring MD:             Kathie Dike, MD Medicines:                Propofol per Anesthesia Complications:            No immediate complications. Estimated Blood Loss:     Estimated blood loss was minimal. Procedure:                Pre-Anesthesia Assessment:                           - Prior to the procedure, a History and Physical                            was performed, and patient medications and                            allergies were reviewed. The patient's tolerance of                            previous anesthesia was also reviewed. The risks                            and benefits of the procedure and the sedation                            options and risks were discussed with the patient.                            All questions were answered, and informed consent                            was obtained. Prior Anticoagulants: The patient                            last took Xarelto (rivaroxaban) 2 days prior to the                            procedure. ASA Grade Assessment: III - A patient                            with severe systemic disease. After reviewing the  risks and benefits, the patient was deemed in                            satisfactory condition to undergo the procedure.                           After obtaining informed consent, the colonoscope                            was passed under direct vision. Throughout the                            procedure, the patient's blood pressure, pulse, and                            oxygen saturations  were monitored continuously. The                            EC-[id number] ([id number]) scope was introduced through                            the anus and advanced to the the cecum, identified                            by appendiceal orifice and ileocecal valve. The                            colonoscopy was technically difficult and complex                            due to significant looping and a tortuous colon.                            Successful completion of the procedure was aided by                            changing the patient to a supine position, using                            manual pressure and withdrawing and reinserting the                            scope. The patient tolerated the procedure well.                            The quality of the bowel preparation was excellent.                            The ileocecal valve, appendiceal orifice, and                            rectum were photographed. Scope In: 8:18:17 AM Scope Out: 9:41:46 AM Total Procedure Duration: 1 hour 23 minutes 29 seconds  Findings:      The perianal exam findings include skin tags.      A diffuse area of mild melanosis was found in the entire colon.      A 6 mm polyp was found in the recto-sigmoid colon. The polyp was       sessile. The polyp was removed with a cold snare. Resection and       retrieval were complete. The pathology specimen was placed into Bottle       Number 1.      A greater than 50 mm polyp was found in the rectum. The polyp was       multi-lobulated. Area was successfully injected with 4 mL eleview for       lesion assessment, and this injection appeared to lift the lesion       adequately. The polyp was removed with a piecemeal technique using a hot       snare. Polyp resection was incomplete. The resected tissue was retrieved       using a Roth net.      The retroflexed view of the distal rectum and anal verge was normal and       showed no anal or rectal  abnormalities. Impression:               - Perianal skin tags found on perianal exam.                           - Melanosis in the colon.                           - One 6 mm polyp at the recto-sigmoid colon,                            removed with a cold snare. Resected and retrieved.                           - One greater than 50 mm polyp in the rectum,                            removed piecemeal using a hot snare. Incomplete                            resection. Resected tissue retrieved. Injected. Moderate Sedation:      Per Anesthesia Care Recommendation:           - Return patient to hospital ward for ongoing care.                           - Cardiac diet today.                           - Continue present medications.                           - Resume Xarelto (rivaroxaban) at prior dose in 3                            days.                           -  Await pathology results.                           - Repeat colonoscopy is recommended. The                            colonoscopy date will be determined after pathology                            results from today's exam become available for                            review. Procedure Code(s):        --- Professional ---                           [phone], Colonoscopy, flexible; with removal of                            tumor(s), polyp(s), or other lesion(s) by snare                            technique                           34287, Colonoscopy, flexible; with directed                            submucosal injection(s), any substance Diagnosis Code(s):        --- Professional ---                           K63.89, Other specified diseases of intestine                           D12.7, Benign neoplasm of rectosigmoid junction                           K62.1, Rectal polyp                           K64.4, Residual hemorrhoidal skin tags                           K62.5, Hemorrhage of anus and rectum CPT copyright 2016 American  Medical Association. All rights reserved. The codes documented in this report are preliminary and upon coder review may  be revised to meet current compliance requirements. Hildred Laser, MD Hildred Laser, MD 04/05/2017 10:00:14 AM This report has been signed electronically. Number of Addenda: 0

## 2017-04-05 NOTE — Progress Notes (Signed)
Brief colonoscopy note:  Examination performed to cecum. Redundant colon with mild melanosis coli. Small polyp cold snare from rectosigmoid junction. Large rectal polyp with broad base. Elevated injected at the distal margin. Piecemeal polypectomy performed. Multiple large pieces removed and retrieved with net. Polypectomy incomplete.  At least two thirds of the polyp removed.

## 2017-04-05 NOTE — Progress Notes (Signed)
Patient has completed GoLYTELY  Prep and two tap water enemas per order.

## 2017-04-05 NOTE — Progress Notes (Signed)
  Subjective:  Patient has no complaints.  She was able able to drink the prep and has been passing watery yellow stools.  She denies abdominal pain nausea or vomiting.  She states rectal bleeding started about 10 days ago.  She has been off Xarelto for 2 days. She says last colonoscopy was about 5 years ago.  History is positive for CRC and a brother who was 17 at the time of diagnosis and is doing fine 5 years later. She states she has lost 48 pounds in 1 year.  She feels weight loss is because of poor appetite.  Objective: Blood pressure (!) 123/98, pulse 65, temperature (!) 97 F (36.1 C), temperature source Axillary, resp. rate 18, height 5\' 6"  (1.676 m), weight 112 lb 14 oz (51.2 kg), SpO2 100 %. Patient  is alert and in no acute distress. Conjunctiva is pink.  Sclerae nonicteric. Neck masses or thyromegaly noted. Cardiac exam with regular rhythm normal S1 and S2.  No murmur or gallop noted. Auscultation of lungs reveal vesicular breath sounds bilaterally. Abdomen is flat soft and nontender with organomegaly or masses. No LE edema or clubbing noted. Labs/studies Results:  Recent Labs    06-Apr-2017 1031 04/04/17 0456  WBC 7.4 9.2  HGB 10.8* 10.7*  HCT 34.6* 33.6*  PLT 296 299    BMET  Recent Labs    Apr 06, 2017 1031 04/04/17 0456  NA 131* 129*  K 4.1 4.3  CL 94* 93*  CO2 29 30  GLUCOSE 97 82  BUN 11 9  CREATININE 0.43* 0.42*  CALCIUM 8.8* 9.3    LFT  Recent Labs    April 06, 2017 1031  PROT 5.8*  ALBUMIN 2.6*  AST 14*  ALT 9*  ALKPHOS 211*  BILITOT 0.5    PT/INR  Recent Labs    2017/04/06 1031 04/04/17 0456  LABPROT 13.7 13.5  INR 1.06 1.04    Lab from this morning is pending.  Assessment:  #1.  GI bleed in the setting of anticoagulation possibly from colonic source.  While H&H from this morning is pending, she has not required blood transfusion.  #2.  Anemia secondary to GI bleed.  #3.  Weight loss.  Plan:  Will proceed with colonoscopy to be followed  by esophagogastroduodenoscopy if colonoscopy is normal. Patient is agreeable to proceed with endoscopic evaluation.     Marland Kitchen

## 2017-04-05 NOTE — Transfer of Care (Signed)
Immediate Anesthesia Transfer of Care Note  Patient: Cassidy Smith  Procedure(s) Performed: COLONOSCOPY WITH PROPOFOL (N/A ) ESOPHAGOGASTRODUODENOSCOPY (EGD) WITH PROPOFOL (N/A )  Patient Location: PACU  Anesthesia Type:MAC  Level of Consciousness: awake and patient cooperative  Airway & Oxygen Therapy: Patient Spontanous Breathing and Patient connected to face mask oxygen  Post-op Assessment: Report given to RN, Post -op Vital signs reviewed and stable and Patient moving all extremities  Post vital signs: Reviewed and stable  Last Vitals:  Vitals Value Taken Time  BP    Temp    Pulse    Resp    SpO2      Last Pain:  Vitals:   04/05/17 0748  TempSrc: Axillary  PainSc: 5       Patients Stated Pain Goal: 6 (99/35/70 1779)  Complications: No apparent anesthesia complications

## 2017-04-05 NOTE — Anesthesia Postprocedure Evaluation (Signed)
Anesthesia Post Note  Patient: Cassidy Smith  Procedure(s) Performed: COLONOSCOPY WITH PROPOFOL (N/A ) POLYPECTOMY  Patient location during evaluation: PACU Anesthesia Type: MAC Level of consciousness: awake and alert and patient cooperative Pain management: pain level controlled Vital Signs Assessment: post-procedure vital signs reviewed and stable Respiratory status: spontaneous breathing, nonlabored ventilation and respiratory function stable Cardiovascular status: blood pressure returned to baseline Postop Assessment: no apparent nausea or vomiting Anesthetic complications: no     Last Vitals:  Vitals:   04/05/17 0800 04/05/17 0949  BP: (!) 123/98 100/61  Pulse: 65 65  Resp: 18 18  Temp:  (!) 36.4 C  SpO2: 100% 100%    Last Pain:  Vitals:   04/05/17 0949  TempSrc:   PainSc: 0-No pain                 Omie Ferger J

## 2017-04-05 NOTE — Anesthesia Preprocedure Evaluation (Addendum)
Anesthesia Evaluation  Patient identified by MRN, date of birth, ID band Patient awake    Reviewed: Allergy & Precautions, NPO status , Patient's Chart, lab work & pertinent test results  Airway Mallampati: I  TM Distance: >3 FB Neck ROM: Full    Dental  (+) Dental Advisory Given, Poor Dentition   Pulmonary Current Smoker,     + decreased breath sounds      Cardiovascular + DVT   Rhythm:Regular Rate:Normal  On Xarelto x 1 month.   Neuro/Psych PSYCHIATRIC DISORDERS Anxiety Depression CVA, No Residual Symptoms    GI/Hepatic GERD  Controlled,  Endo/Other    Renal/GU Renal disease     Musculoskeletal   Abdominal (+)  Abdomen: soft. Bowel sounds: normal.  Peds  Hematology  (+) anemia ,   Anesthesia Other Findings   Reproductive/Obstetrics                           Anesthesia Physical Anesthesia Plan  ASA: III  Anesthesia Plan: MAC   Post-op Pain Management:    Induction:   PONV Risk Score and Plan:   Airway Management Planned: Simple Face Mask  Additional Equipment:   Intra-op Plan:   Post-operative Plan:   Informed Consent: I have reviewed the patients History and Physical, chart, labs and discussed the procedure including the risks, benefits and alternatives for the proposed anesthesia with the patient or authorized representative who has indicated his/her understanding and acceptance.   Dental advisory given  Plan Discussed with: Surgeon and Anesthesiologist  Anesthesia Plan Comments:         Anesthesia Quick Evaluation

## 2017-04-05 NOTE — Progress Notes (Signed)
PROGRESS NOTE    Cassidy Smith  OJJ:[id number] DOB: 10-Apr-1955 DOA: 04/03/2017 PCP: Neale Burly, MD    Brief Narrative:  62 year old female admitted from nursing facility with rectal bleeding.  She recently had a hip fracture and developed a DVT for which she was started on anticoagulation.  Hemoglobin has been stable.  Anticoagulation held on admission.  GI is following and plans on endoscopic evaluation on 3/23.   Assessment & Plan:   Principal Problem:   GI bleed Active Problems:   Hyponatremia   History of depression   Hx of anxiety disorder   Orthostatic hypotension   Hypomagnesemia   S/P ORIF (open reduction internal fixation) fracture right hip IM nail 03/07/17   Anticoagulated   Stool bloody   Current every day smoker   Rectal bleeding   Chronic diarrhea   Hyperthyroidism   Pressure injury of skin   1. GI bleeding.  Patient was admitted to the hospital with dark colored stools.  She was recently started on Xarelto for right lower extremity DVT.  Hemoglobin appears to be stable.  She underwent colonoscopy today that showed large rectal polyp which was felt to be source of bleeding.  This has been biopsied.  Will need to follow-up results to see if this is malignant or not. 2. Anemia.  Hemoglobin stable since last discharge.  Continue to follow. 3. DVT from recent hip surgery.  Xarelto currently on hold.  Discussed with Dr. Laural Golden and can possibly restart Xarelto on 3/25 stable. 4. Hyperthyroidism.  Continue on Tapazole 5. Hyponatremia.  Appears to be chronic.  She is asymptomatic.  Cortisol, TSH, urine sodium, osmolarity has been ordered.  Will also order chest x-ray.  She appears to be euvolemic.   DVT prophylaxis: None.  No pharmacologic DVT prophylaxis since there is concern for GI bleeding.  Since she already has underlying DVT, would avoid SCDs Code Status: Full code Family Communication: No family present Disposition Plan: Possible discharge 3/25 once biopsy  results are available and she can restart on anticoagulation   Consultants:   Gastroenterology  Procedures:     Antimicrobials:      Subjective: She is unaware of any further blood in stool. No vomiting. No abdominal pain  Objective: Vitals:   04/05/17 1000 04/05/17 1010 04/05/17 1015 04/05/17 1017  BP: 108/64 104/74 115/76 117/63  Pulse: 73 71 75 75  Resp: 18 20 19 17   Temp:      TempSrc:      SpO2: 100% 100% 96% 94%  Weight:      Height:        Intake/Output Summary (Last 24 hours) at 04/05/2017 1829 Last data filed at 04/05/2017 0933 Gross per 24 hour  Intake 610 ml  Output -  Net 610 ml   Filed Weights   04/03/17 0953 04/03/17 1342  Weight: 50.8 kg (112 lb) 51.2 kg (112 lb 14 oz)    Examination:  General exam: Alert, awake, oriented x 3 Respiratory system: Clear to auscultation. Respiratory effort normal. Cardiovascular system:RRR. No murmurs, rubs, gallops. Gastrointestinal system: Abdomen is nondistended, soft and nontender. No organomegaly or masses felt. Normal bowel sounds heard. Central nervous system: Alert and oriented. No focal neurological deficits. Extremities: No C/C/E, +pedal pulses Skin: No rashes, lesions or ulcers Psychiatry: Judgement and insight appear normal. Mood & affect appropriate.   Data Reviewed: I have personally reviewed following labs and imaging studies  CBC: Recent Labs  Lab 04/03/17 1031 04/04/17 0456 04/05/17 0738  WBC  7.4 9.2 8.9  NEUTROABS 4.4  --   --   HGB 10.8* 10.7* 11.0*  HCT 34.6* 33.6* 34.5*  MCV 94.0 91.6 91.5  PLT 296 299 992   Basic Metabolic Panel: Recent Labs  Lab 04/03/17 1031 04/03/17 1042 04/04/17 0456 04/05/17 0738  NA 131*  --  129* 124*  K 4.1  --  4.3 4.1  CL 94*  --  93* 90*  CO2 29  --  30 26  GLUCOSE 97  --  82 104*  BUN 11  --  9 10  CREATININE 0.43*  --  0.42* 0.43*  CALCIUM 8.8*  --  9.3 9.4  MG  --  1.4* 1.6*  --    GFR: Estimated Creatinine Clearance: 58.2 mL/min (A)  (by C-G formula based on SCr of 0.43 mg/dL (L)). Liver Function Tests: Recent Labs  Lab 04/03/17 1031  AST 14*  ALT 9*  ALKPHOS 211*  BILITOT 0.5  PROT 5.8*  ALBUMIN 2.6*   No results for input(s): LIPASE, AMYLASE in the last 168 hours. No results for input(s): AMMONIA in the last 168 hours. Coagulation Profile: Recent Labs  Lab 04/03/17 1031 04/04/17 0456  INR 1.06 1.04   Cardiac Enzymes: No results for input(s): CKTOTAL, CKMB, CKMBINDEX, TROPONINI in the last 168 hours. BNP (last 3 results) No results for input(s): PROBNP in the last 8760 hours. HbA1C: No results for input(s): HGBA1C in the last 72 hours. CBG: No results for input(s): GLUCAP in the last 168 hours. Lipid Profile: No results for input(s): CHOL, HDL, LDLCALC, TRIG, CHOLHDL, LDLDIRECT in the last 72 hours. Thyroid Function Tests: No results for input(s): TSH, T4TOTAL, FREET4, T3FREE, THYROIDAB in the last 72 hours. Anemia Panel: No results for input(s): VITAMINB12, FOLATE, FERRITIN, TIBC, IRON, RETICCTPCT in the last 72 hours. Sepsis Labs: Recent Labs  Lab 04/03/17 1039  LATICACIDVEN 0.75    Recent Results (from the past 240 hour(s))  MRSA PCR Screening     Status: None   Collection Time: 04/04/17 12:03 AM  Result Value Ref Range Status   MRSA by PCR NEGATIVE NEGATIVE Final    Comment:        The GeneXpert MRSA Assay (FDA approved for NASAL specimens only), is one component of a comprehensive MRSA colonization surveillance program. It is not intended to diagnose MRSA infection nor to guide or monitor treatment for MRSA infections. Performed at [facility], [address], [place], [place] [address]          Radiology Studies: No results found.      Scheduled Meds: . benztropine  1 mg Oral BID  . divalproex  1,500 mg Oral QHS  . feeding supplement (ENSURE ENLIVE)  237 mL Oral BID BM  . ferrous sulfate  325 mg Oral Q breakfast  . methimazole  10 mg Oral Daily  . nicotine  14  mg Transdermal Daily  . pantoprazole  40 mg Oral BID AC  . risperiDONE  4 mg Oral QHS  . senna-docusate  1 tablet Oral BID  . sodium chloride flush  3 mL Intravenous Q12H  . traZODone  200 mg Oral QHS  . vitamin B-12  1,000 mcg Oral Daily   Continuous Infusions: . sodium chloride       LOS: 2 days    Time spent: 41mins    Kathie Dike, MD [facility] Pager [phone]  If 7PM-7AM, please contact night-coverage [email or web address] Password [facility] 04/05/2017, 6:29 PM

## 2017-04-06 LAB — CBC
HCT: 32.9 % — ABNORMAL LOW (ref 36.0–46.0)
Hemoglobin: 10.7 g/dL — ABNORMAL LOW (ref 12.0–15.0)
MCH: 29.3 pg (ref 26.0–34.0)
MCHC: 32.5 g/dL (ref 30.0–36.0)
MCV: 90.1 fL (ref 78.0–100.0)
PLATELETS: 251 10*3/uL (ref 150–400)
RBC: 3.65 MIL/uL — ABNORMAL LOW (ref 3.87–5.11)
RDW: 18.5 % — AB (ref 11.5–15.5)
WBC: 9.2 10*3/uL (ref 4.0–10.5)

## 2017-04-06 LAB — BASIC METABOLIC PANEL
Anion gap: 9 (ref 5–15)
BUN: 7 mg/dL (ref 6–20)
CALCIUM: 8.6 mg/dL — AB (ref 8.9–10.3)
CO2: 27 mmol/L (ref 22–32)
CREATININE: 0.35 mg/dL — AB (ref 0.44–1.00)
Chloride: 90 mmol/L — ABNORMAL LOW (ref 101–111)
GFR calc Af Amer: >60 mL/min (ref >60)
GLUCOSE: 85 mg/dL (ref 65–99)
POTASSIUM: 4 mmol/L (ref 3.5–5.1)
Sodium: 126 mmol/L — ABNORMAL LOW (ref 135–145)

## 2017-04-06 LAB — CORTISOL-AM, BLOOD: Cortisol - AM: 15.5 ug/dL (ref 6.7–22.6)

## 2017-04-06 MED ORDER — FUROSEMIDE 10 MG/ML IJ SOLN
20.0000 mg | Freq: Once | INTRAMUSCULAR | Status: AC
  Administered 2017-04-06: 20 mg via INTRAVENOUS
  Filled 2017-04-06: qty 2

## 2017-04-06 NOTE — Anesthesia Postprocedure Evaluation (Signed)
Anesthesia Post Note  Patient: Cassidy Smith  Procedure(s) Performed: COLONOSCOPY WITH PROPOFOL (N/A ) POLYPECTOMY  Patient location during evaluation: Nursing Unit Anesthesia Type: MAC Level of consciousness: awake and patient cooperative Pain management: pain level controlled Vital Signs Assessment: post-procedure vital signs reviewed and stable Respiratory status: spontaneous breathing, nonlabored ventilation and respiratory function stable Cardiovascular status: blood pressure returned to baseline Postop Assessment: no apparent nausea or vomiting Anesthetic complications: no     Last Vitals:  Vitals:   04/05/17 2132 04/06/17 0650  BP: 115/77 122/68  Pulse: 75 68  Resp: 18 20  Temp: 36.8 C 36.9 C  SpO2: 96% 95%    Last Pain:  Vitals:   04/06/17 0650  TempSrc: Oral  PainSc:                  Trevious Rampey J

## 2017-04-06 NOTE — Addendum Note (Signed)
Addendum  created 04/06/17 1036 by Charmaine Downs, CRNA   Sign clinical note

## 2017-04-06 NOTE — Progress Notes (Signed)
PROGRESS NOTE    Cassidy Smith  EXH:[id number] DOB: 01/03/1956 DOA: 04/03/2017 PCP: Neale Burly, MD    Brief Narrative:  62 year old female admitted from nursing facility with rectal bleeding.  She recently had a hip fracture and developed a DVT for which she was started on anticoagulation.  Hemoglobin has been stable and she has not required any transfusion PRBC.  Anticoagulation held on admission.  Patient evaluated by GI and underwent colonoscopy on 3/23 that showed large rectal polyp.  This was partially resected and sent for biopsy.  Currently awaiting biopsy results to formulate further plan of care.  If it is positive for malignancy, she will need general surgery evaluation for further management.  If this is negative, can likely restart patient on anticoagulation she can discharge back to nursing home   Assessment & Plan:   Principal Problem:   GI bleed Active Problems:   Hyponatremia   History of depression   Hx of anxiety disorder   Orthostatic hypotension   Hypomagnesemia   S/P ORIF (open reduction internal fixation) fracture right hip IM nail 03/07/17   Anticoagulated   Stool bloody   Current every day smoker   Rectal bleeding   Chronic diarrhea   Hyperthyroidism   Pressure injury of skin   1. GI bleeding.  Patient was admitted to the hospital with dark colored stools.  She was recently started on Xarelto for right lower extremity DVT.  Hemoglobin has been stable through her hospital course.  She underwent colonoscopy on 3/23 that showed large rectal polyp which was felt to be source of bleeding.  This was partially resected and sent for biopsy.  Results of biopsy should hopefully be back by 3/25.  If this is positive for malignancy, she will need further evaluation by general surgery.  If this is negative, she can likely be restarted on anticoagulation. 2. Anemia.  Hemoglobin stable since last discharge.  Continue to follow. 3. DVT from recent hip surgery.  Xarelto  currently on hold.  Discussed with Dr. Laural Golden and can possibly restart Xarelto on 3/25 if she remains stable and biopsy results are negative. 4. Hyperthyroidism.  Continue on Tapazole 5. Hyponatremia.  Appears to be chronic.  She is asymptomatic.  She does appear to be euvolemic.  Chest x-ray indicates possible pleural effusion.  TSH, cortisol are normal.  Serum osmolality is low at 268.  Urine sodium and osmolality have been ordered but have not been sent as of yet.  Will give 1 dose of Lasix recheck labs in a.m.  Limit free water intake.  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   DVT prophylaxis: None.  No pharmacologic DVT prophylaxis since there is concern for GI bleeding.  Since she already has underlying DVT, would avoid SCDs Code Status: Full code Family Communication: No family present Disposition Plan: If biopsy results are negative, can possibly discharge on 3/25 after restarting anticoagulation.   Consultants:   Gastroenterology  Procedures:     Antimicrobials:      Subjective: I had a bowel movement this morning that contained some dark blood.  No abdominal pain.  No shortness of breath.  Objective: Vitals:   04/05/17 1452 04/05/17 2132 04/06/17 0650 04/06/17 1500  BP: 109/78 115/77 122/68 106/62  Pulse: 84 75 68 78  Resp: 18 18 20 18   Temp: 98.1 F (36.7 C) 98.2 F (36.8 C) 98.4 F (36.9 C) 98.4 F (36.9 C)  TempSrc:  Oral Oral Oral  SpO2: 98% 96% 95% 97%  Weight:      Height:        Intake/Output Summary (Last 24 hours) at 04/06/2017 1825 Last data filed at 04/05/2017 2135 Gross per 24 hour  Intake 240 ml  Output -  Net 240 ml   Filed Weights   04/03/17 0953 04/03/17 1342  Weight: 50.8 kg (112 lb) 51.2 kg (112 lb 14 oz)    Examination:  General exam: Alert, awake, oriented x 3 Respiratory system: Clear to auscultation. Respiratory effort normal. Cardiovascular system:RRR. No murmurs, rubs, gallops. Gastrointestinal system: Abdomen is nondistended, soft  and nontender. No organomegaly or masses felt. Normal bowel sounds heard. Central nervous system: Alert and oriented. No focal neurological deficits. Extremities: No C/C/E, +pedal pulses Skin: No rashes, lesions or ulcers Psychiatry: Judgement and insight appear normal. Mood & affect appropriate.   Data Reviewed: I have personally reviewed following labs and imaging studies  CBC: Recent Labs  Lab 04/03/17 1031 04/04/17 0456 04/05/17 0738 04/06/17 0718  WBC 7.4 9.2 8.9 9.2  NEUTROABS 4.4  --   --   --   HGB 10.8* 10.7* 11.0* 10.7*  HCT 34.6* 33.6* 34.5* 32.9*  MCV 94.0 91.6 91.5 90.1  PLT 296 299 264 093   Basic Metabolic Panel: Recent Labs  Lab 04/03/17 1031 04/03/17 1042 04/04/17 0456 04/05/17 0738 04/06/17 0718  NA 131*  --  129* 124* 126*  K 4.1  --  4.3 4.1 4.0  CL 94*  --  93* 90* 90*  CO2 29  --  30 26 27   GLUCOSE 97  --  82 104* 85  BUN 11  --  9 10 7   CREATININE 0.43*  --  0.42* 0.43* 0.35*  CALCIUM 8.8*  --  9.3 9.4 8.6*  MG  --  1.4* 1.6*  --   --    GFR: Estimated Creatinine Clearance: 58.2 mL/min (A) (by C-G formula based on SCr of 0.35 mg/dL (L)). Liver Function Tests: Recent Labs  Lab 04/03/17 1031  AST 14*  ALT 9*  ALKPHOS 211*  BILITOT 0.5  PROT 5.8*  ALBUMIN 2.6*   No results for input(s): LIPASE, AMYLASE in the last 168 hours. No results for input(s): AMMONIA in the last 168 hours. Coagulation Profile: Recent Labs  Lab 04/03/17 1031 04/04/17 0456  INR 1.06 1.04   Cardiac Enzymes: No results for input(s): CKTOTAL, CKMB, CKMBINDEX, TROPONINI in the last 168 hours. BNP (last 3 results) No results for input(s): PROBNP in the last 8760 hours. HbA1C: No results for input(s): HGBA1C in the last 72 hours. CBG: No results for input(s): GLUCAP in the last 168 hours. Lipid Profile: No results for input(s): CHOL, HDL, LDLCALC, TRIG, CHOLHDL, LDLDIRECT in the last 72 hours. Thyroid Function Tests: Recent Labs    04/05/17 1910  TSH 0.741    Anemia Panel: No results for input(s): VITAMINB12, FOLATE, FERRITIN, TIBC, IRON, RETICCTPCT in the last 72 hours. Sepsis Labs: Recent Labs  Lab 04/03/17 1039  LATICACIDVEN 0.75    Recent Results (from the past 240 hour(s))  MRSA PCR Screening     Status: None   Collection Time: 04/04/17 12:03 AM  Result Value Ref Range Status   MRSA by PCR NEGATIVE NEGATIVE Final    Comment:        The GeneXpert MRSA Assay (FDA approved for NASAL specimens only), is one component of a comprehensive MRSA colonization surveillance program. It is not intended to diagnose MRSA infection nor to guide or monitor treatment for MRSA infections. Performed  at [facility], [address], [place], [place] [address]          Radiology Studies: Dg Chest 2 View  Result Date: 04/05/2017 CLINICAL DATA:  Hyponatremia and weight loss EXAM: CHEST - 2 VIEW COMPARISON:  03/06/2017 FINDINGS: Cardiac shadow is stable. The lungs are hyperinflated. Mild blunting of the costophrenic angle is noted on the left likely related to small effusion. No focal infiltrate is seen. No bony abnormality is noted. IMPRESSION: Small pleural effusion on the left. No other focal abnormality is seen. Electronically Signed   By: Inez Catalina M.D.   On: 04/05/2017 19:23        Scheduled Meds: . benztropine  1 mg Oral BID  . divalproex  1,500 mg Oral QHS  . feeding supplement (ENSURE ENLIVE)  237 mL Oral BID BM  . ferrous sulfate  325 mg Oral Q breakfast  . furosemide  20 mg Intravenous Once  . methimazole  10 mg Oral Daily  . nicotine  14 mg Transdermal Daily  . pantoprazole  40 mg Oral BID AC  . risperiDONE  4 mg Oral QHS  . senna-docusate  1 tablet Oral BID  . sodium chloride flush  3 mL Intravenous Q12H  . traZODone  200 mg Oral QHS  . vitamin B-12  1,000 mcg Oral Daily   Continuous Infusions: . sodium chloride       LOS: 3 days    Time spent: 43mins    Kathie Dike, MD [facility] Pager  [phone]  If 7PM-7AM, please contact night-coverage [email or web address] Password [facility] 04/06/2017, 6:25 PM

## 2017-04-06 NOTE — Progress Notes (Signed)
  Subjective:  Patient has no complaints.  She denies abdominal pain nausea or vomiting.  She states she had a bowel movement this morning when she passed small amount of dark blood.  Objective: Blood pressure 122/68, pulse 68, temperature 98.4 F (36.9 C), temperature source Oral, resp. rate 20, height 5\' 6"  (1.676 m), weight 112 lb 14 oz (51.2 kg), SpO2 95 %. Patient is alert and in no acute distress. Abdomen is soft and nontender without organomegaly or masses.  Labs/studies Results:  Recent Labs    04-23-2017 0456 04/05/17 0738 04/06/17 0718  WBC 9.2 8.9 9.2  HGB 10.7* 11.0* 10.7*  HCT 33.6* 34.5* 32.9*  PLT 299 264 251    BMET  Recent Labs    04/23/17 0456 04/05/17 0738 04/06/17 0718  NA 129* 124* 126*  K 4.3 4.1 4.0  CL 93* 90* 90*  CO2 30 26 27   GLUCOSE 82 104* 85  BUN 9 10 7   CREATININE 0.42* 0.43* 0.35*  CALCIUM 9.3 9.4 8.6*    LFT  No results for input(s): PROT, ALBUMIN, AST, ALT, ALKPHOS, BILITOT, BILIDIR, IBILI in the last 72 hours.  PT/INR  Recent Labs    2017-04-23 0456  LABPROT 13.5  INR 1.04   Polyp biopsy pending.  Assessment:  #1.  Rectal bleeding secondary to very large rectal polyp.  She underwent colonoscopy yesterday with piecemeal removal of this polyp.  At least two thirds of the polyp removed.  If histology is negative for invasive carcinoma rest of the polyp will be removed within the next few weeks.  If biopsy positive for invasive carcinoma she will need surgery.  #2.  Anemia secondary to GI bleed.  Hemoglobin remained stable.  Patient has not required any transfusion.  #3.  DVT involving the right peroneal vein.  Anticoagulation on hold.  Recommendations:  H&H in a.m. Further recommendations to be made after biopsy results available which should be tomorrow morning. Anticoagulant can be resumed after 24 hours.

## 2017-04-07 ENCOUNTER — Encounter (HOSPITAL_COMMUNITY): Payer: Self-pay | Admitting: Internal Medicine

## 2017-04-07 DIAGNOSIS — E43 Unspecified severe protein-calorie malnutrition: Secondary | ICD-10-CM | POA: Diagnosis present

## 2017-04-07 LAB — SODIUM, URINE, RANDOM: Sodium, Ur: 83 mmol/L

## 2017-04-07 LAB — CBC
HEMATOCRIT: 31.6 % — AB (ref 36.0–46.0)
HEMOGLOBIN: 10.1 g/dL — AB (ref 12.0–15.0)
MCH: 28.9 pg (ref 26.0–34.0)
MCHC: 32 g/dL (ref 30.0–36.0)
MCV: 90.5 fL (ref 78.0–100.0)
PLATELETS: 217 10*3/uL (ref 150–400)
RBC: 3.49 MIL/uL — AB (ref 3.87–5.11)
RDW: 18.7 % — ABNORMAL HIGH (ref 11.5–15.5)
WBC: 7.7 10*3/uL (ref 4.0–10.5)

## 2017-04-07 LAB — BASIC METABOLIC PANEL
ANION GAP: 6 (ref 5–15)
BUN: 7 mg/dL (ref 6–20)
CO2: 30 mmol/L (ref 22–32)
Calcium: 9 mg/dL (ref 8.9–10.3)
Chloride: 93 mmol/L — ABNORMAL LOW (ref 101–111)
Creatinine, Ser: 0.41 mg/dL — ABNORMAL LOW (ref 0.44–1.00)
GFR calc Af Amer: >60 mL/min (ref >60)
GLUCOSE: 89 mg/dL (ref 65–99)
POTASSIUM: 3.9 mmol/L (ref 3.5–5.1)
Sodium: 129 mmol/L — ABNORMAL LOW (ref 135–145)

## 2017-04-07 LAB — OSMOLALITY, URINE: Osmolality, Ur: 356 mOsm/kg (ref 300–900)

## 2017-04-07 LAB — GLUCOSE, CAPILLARY: Glucose-Capillary: 130 mg/dL — ABNORMAL HIGH (ref 65–99)

## 2017-04-07 NOTE — Progress Notes (Signed)
Initial Nutrition Assessment  DOCUMENTATION CODES:   Underweight, Severe malnutrition in context of chronic illness  INTERVENTION:   Continue Ensure Enlive po BID, each supplement provides 350 kcal and 20 grams of protein  NUTRITION DIAGNOSIS:   Severe Malnutrition related to chronic illness(CKD, GI conditions, chronic diarrhea) as evidenced by severe muscle depletion, severe fat depletion.  GOAL:   Patient will meet greater than or equal to 90% of their needs  MONITOR:   PO intake, Supplement acceptance, Labs, Weight trends, I & O's  REASON FOR ASSESSMENT:   Other (Comment)(Low BMI)    ASSESSMENT:   Pt with PMH of bipolar disorder, anxiety, depression, neuromuscular disorder, CKD, recent admission for hip fracture and surgery presents from [facility] and rehab with rectal bleeding. Pt s/p colonoscopy 04/05/17, large rectal polyp was detected and partially resected.   Awaiting pathology findings of polyp biopsy.  Pt reports a fair appetite PTA. She reports consuming 3 partial meals per day never finishing each meal. Pt reports disliking the food at the rehab facility. Meal completion records reveal pt consuming 0-100% of meals currently.   Pt unsure of weight status or changes however reports a current UBW of 115 lbs. Per weight recordings, pt seems to be chronically (since 2015) underweight.   Pt enjoying Ensure supplementation and amenable to continuing while admitted.   Labs reviewed; Na 129 (up from 124 03/23), Magnesium 1.6 (up from 1.4), Albumin 2.6,  Medications reviewed; ferrous sulfate, Protonix, Senokot-S, Tapazole, Vitamin B12   NUTRITION - FOCUSED PHYSICAL EXAM:    Most Recent Value  Orbital Region  Moderate depletion  Upper Arm Region  Severe depletion  Thoracic and Lumbar Region  Severe depletion  Buccal Region  Severe depletion  Temple Region  Severe depletion  Clavicle Bone Region  Moderate depletion  Clavicle and Acromion Bone Region  Moderate  depletion  Scapular Bone Region  Unable to assess  Dorsal Hand  Moderate depletion  Patellar Region  Severe depletion  Anterior Thigh Region  Severe depletion  Posterior Calf Region  Severe depletion  Edema (RD Assessment)  None     Diet Order:  Diet Heart Room service appropriate? Yes; Fluid consistency: Thin  EDUCATION NEEDS:   Not appropriate for education at this time  Skin:  Skin Assessment: Skin Integrity Issues: Skin Integrity Issues:: Stage I Stage I: sacrum  Last BM:  04/06/17  Height:   Ht Readings from Last 1 Encounters:  04/03/17 5\' 6"  (1.676 m)   Weight:   Wt Readings from Last 1 Encounters:  04/03/17 112 lb 14 oz (51.2 kg)   Ideal Body Weight:  59 kg  BMI:  Body mass index is 18.22 kg/m.  Estimated Nutritional Needs:   Kcal:  1750-2000  Protein:  70-80 grams  Fluid:  >/= 1.8 L/d  Parks Ranger, MS, RDN, LDN 04/07/2017 2:19 PM

## 2017-04-07 NOTE — Clinical Social Work Note (Signed)
Patient was recently placed at [facility] for [employer]. She has been there about 3 weeks.  She is able to use a walker in rehab but still uses her wheelchair most of the day. Patient stated that she receives assitance with bathing and dressing from staff.  Patient indicated that she plans on going back to the facility to complete her rehab as she hopes to go home soon. Lynnae Sandhoff at [facility] confirmed patient's statements and advised that she can return to the facility at discharged.  Patient's full assessment from previous admission is posted below.       Clinical Social Work Assessment  Patient Details  Name: Cassidy Smith MRN: [id number] Date of Birth: 1955/09/29  Date of referral:  03/11/17               Reason for consult:  Facility Placement                 Permission sought to share information with:  Chartered certified accountant granted to share information::                Name::                   Agency::  [facility], [place], [place] [facility]             Relationship::                Contact Information:     Housing/Transportation Living arrangements for the past 2 months:  [place] of Information:  Patient, Siblings Patient Interpreter Needed:  None Criminal Activity/Legal Involvement Pertinent to Current Situation/Hospitalization:  No - Comment as needed Significant Relationships:  Siblings Lives with:  Self Do you feel safe going back to the place where you live?  Yes Need for family participation in patient care:  Yes (Comment)  Care giving concerns: Pt lives alone.   Social Worker assessment / plan: Pt is a 62 year old female referred to [facility] for SNF placement. Met with pt and her brother today to review recommendations. Pt hesitant but agreeable to SNF referrals. They request [facility], [place] Pt only has Medicaid as a payer source so other facilities may need to be considered.  Pt also has a diagnosis of bipolar  disorder and will need additional [place] screening.   Employment status:  Disabled (Comment on whether or not currently receiving Disability) Insurance information:  Medicaid In [place] PT Recommendations:  [place] / Referral to community resources:     Patient/Family's Response to care: Pt and brother accepting of care.  Patient/Family's Understanding of and Emotional Response to Diagnosis, Current Treatment, and Prognosis: Pt and her brother appear to have a good understanding of diagnosis and treatment recommendations. No emotional distress identified.  Emotional Assessment Appearance:  Appears stated age Attitude/Demeanor/Rapport:    Affect (typically observed):  Accepting, Pleasant Orientation:  Oriented to Self, Oriented to Place, Oriented to Situation Alcohol / Substance use:  Not Applicable Psych involvement (Current and /or in the community):  No (Comment)  Discharge Needs  Concerns to be addressed:  Discharge Planning Concerns Readmission within the last 30 days:  No Current discharge risk:  Physical Impairment, Lives alone Barriers to Discharge:  [place] ([place])   Shade Flood, LCSW 03/11/2017, 12:46 PM           Electronically signed by Shade Flood, LCSW at 03/11/2017 12:54 PM

## 2017-04-07 NOTE — Progress Notes (Signed)
    Subjective: 2 stools yesterday, none overnight. Patient states is loose, which is chronic for her. Nursing states they were not informed of this from night shift or of rectal bleeding. Patient notes small amount blood per rectum yesterday. Poor appetite. No abdominal pain, N/V. Feels "achy" all over.   Objective: Vital signs in last 24 hours: Temp:  [98.3 F (36.8 C)-98.4 F (36.9 C)] 98.3 F (36.8 C) (03/25 0456) Pulse Rate:  [65-78] 65 (03/25 0456) Resp:  [16-18] 18 (03/25 0456) BP: (103-128)/(60-71) 128/71 (03/25 0456) SpO2:  [97 %-98 %] 98 % (03/25 0456) Last BM Date: 04/06/17 General:   Alert and oriented, pleasant Head:  Normocephalic and atraumatic. Abdomen:  Bowel sounds present, soft, non-tender, non-distended. No HSM or hernias noted. No rebound or guarding. No masses appreciated Neurologic:  Alert and  oriented x4 Psych:  Normal mood and affect.  Intake/Output from previous day: 03/24 0701 - 03/25 0700 In: 240 [P.O.:240] Out: 400 [Urine:400] Intake/Output this shift: No intake/output data recorded.  Lab Results: Recent Labs    04/05/17 0738 04/06/17 0718 04/07/17 0550  WBC 8.9 9.2 7.7  HGB 11.0* 10.7* 10.1*  HCT 34.5* 32.9* 31.6*  PLT 264 251 217   BMET Recent Labs    04/05/17 0738 04/06/17 0718 04/07/17 0550  NA 124* 126* 129*  K 4.1 4.0 3.9  CL 90* 90* 93*  CO2 26 27 30   GLUCOSE 104* 85 89  BUN 10 7 7   CREATININE 0.43* 0.35* 0.41*  CALCIUM 9.4 8.6* 9.0     Studies/Results: Dg Chest 2 View  Result Date: 04/05/2017 CLINICAL DATA:  Hyponatremia and weight loss EXAM: CHEST - 2 VIEW COMPARISON:  03/06/2017 FINDINGS: Cardiac shadow is stable. The lungs are hyperinflated. Mild blunting of the costophrenic angle is noted on the left likely related to small effusion. No focal infiltrate is seen. No bony abnormality is noted. IMPRESSION: Small pleural effusion on the left. No other focal abnormality is seen. Electronically Signed   By: Inez Catalina  M.D.   On: 04/05/2017 19:23    Assessment: 62 year old female admitted with rectal bleeding on anticoagulation, held since admission. Colonoscopy 3/23 with large rectal polyp, removed piecemeal with incomplete resection. Pathology pending and hopefully back today. If histology negative for carcinoma, early interval colonoscopy in next few weeks recommended for complete removal. If positive for carcinoma, will need surgical consultation. Hgb stable. Anticipate restarting Xarelto today or tomorrow, after review of pathology. Low-volume rectal bleeding reported per patient not hemodynamically significant, with Hgb overall stable.    Plan: Follow H/H Await pathology findings Resumption of anticoagulation to be determined after review of pathology Will need either early interval colonoscopy vs surgical evaluation in near future. Awaiting pathology.  Annitta Needs, PhD, ANP-BC [facility]    LOS: 4 days    04/07/2017, 8:00 AM

## 2017-04-07 NOTE — NC FL2 (Signed)
[place] MEDICAID FL2 LEVEL OF CARE SCREENING TOOL     IDENTIFICATION  Patient Name: Cassidy Smith Birthdate: 05/29/1955 Sex: female Admission Date (Current Location): 04/03/2017  [facility] and [place] Number:  [employer] and Address:  [facility] [address] [place]      Provider Number: [id number]  Attending Physician Name and Address:  Rama, Venetia Maxon, MD  Relative Name and Phone Number:  Lorrin Goodell (brother) [phone]    Current Level of Care: Hospital Recommended Level of Care: [place] Prior Approval Number:    Date Approved/Denied:   PASRR Number:    Discharge Plan: SNF    Current Diagnoses: Patient Active Problem List   Diagnosis Date Noted  . Pressure injury of skin 04/04/2017  . GI bleed 04/03/2017  . Anticoagulated 04/03/2017  . Stool bloody 04/03/2017  . Current every day smoker 04/03/2017  . Rectal bleeding   . Chronic diarrhea   . Hyperthyroidism   . S/P ORIF (open reduction internal fixation) fracture right hip IM nail 03/07/17 03/24/2017  . Hypomagnesemia 03/07/2017  . Fall   . Closed intertrochanteric fracture of hip, right, initial encounter ([place]) 03/06/2017  . Radial styloid fracture: right 03/06/2017  . Anemia 03/06/2017  . Orthostatic hypotension 06/07/2015  . UTI (urinary tract infection) 06/07/2015  . Rhabdomyolysis 06/07/2015  . White matter abnormality on MRI of brain 05/14/2013  . History of depression 05/14/2013  . Hx of anxiety disorder 05/14/2013  . Bipolar I disorder, most recent episode (or current) manic, unspecified 05/14/2013  . Bipolar 1 disorder ([place]) 05/14/2013  . Hyponatremia 05/12/2013  . Bipolar disorder ([place]) 05/12/2013  . Lacunar infarct, acute 05/12/2013    Orientation RESPIRATION BLADDER Height & Weight     Self, Time, Situation, Place  Normal Incontinent Weight: 112 lb 14 oz (51.2 kg) Height:  5\' 6"  (167.6 cm)  BEHAVIORAL SYMPTOMS/MOOD  NEUROLOGICAL BOWEL NUTRITION STATUS      Continent Diet(Heart Healthy)  AMBULATORY STATUS COMMUNICATION OF NEEDS Skin   Extensive Assist Verbally PU Stage and Appropriate Care                       Personal Care Assistance Level of Assistance  Bathing, Feeding, Dressing Bathing Assistance: Limited assistance Feeding assistance: Independent Dressing Assistance: Limited assistance     Functional Limitations Info  Sight, Hearing, Speech Sight Info: Adequate Hearing Info: Adequate Speech Info: Adequate    SPECIAL CARE FACTORS FREQUENCY                       Contractures Contractures Info: Not present    Additional Factors Info  Code Status, Allergies, Psychotropic Code Status Info: Full code Allergies Info: NKA Psychotropic Info: Cogentin, Risperdal, Desyrel         Current Medications (04/07/2017):  This is the current hospital active medication list Current Facility-Administered Medications  Medication Dose Route Frequency Provider Last Rate Last Dose  . 0.9 %  sodium chloride infusion  250 mL Intravenous PRN Johnson, Clanford L, MD      . acetaminophen (TYLENOL) tablet 650 mg  650 mg Oral Q6H PRN Johnson, Clanford L, MD   650 mg at 04/07/17 1245   Or  . acetaminophen (TYLENOL) suppository 650 mg  650 mg Rectal Q6H PRN Johnson, Clanford L, MD      . benztropine (COGENTIN) tablet 1 mg  1 mg Oral BID Johnson, Clanford L, MD   1  mg at 04/06/17 2147  . bisacodyl (DULCOLAX) EC tablet 5 mg  5 mg Oral Daily PRN Walden Field A, NP      . divalproex (DEPAKOTE ER) 24 hr tablet 1,500 mg  1,500 mg Oral QHS Johnson, Clanford L, MD   1,500 mg at 04/06/17 2147  . feeding supplement (ENSURE ENLIVE) (ENSURE ENLIVE) liquid 237 mL  237 mL Oral BID BM Johnson, Clanford L, MD   237 mL at 04/04/17 0829  . ferrous sulfate tablet 325 mg  325 mg Oral Q breakfast Wynetta Emery, Clanford L, MD   325 mg at 04/07/17 0803  . methimazole (TAPAZOLE) tablet 10 mg  10 mg Oral Daily Johnson, Clanford L,  MD   10 mg at 04/06/17 1157  . nicotine (NICODERM CQ - dosed in mg/24 hours) patch 14 mg  14 mg Transdermal Daily Johnson, Clanford L, MD   14 mg at 04/07/17 1017  . ondansetron (ZOFRAN) tablet 4 mg  4 mg Oral Q6H PRN Johnson, Clanford L, MD       Or  . ondansetron (ZOFRAN) injection 4 mg  4 mg Intravenous Q6H PRN Wynetta Emery, Clanford L, MD   4 mg at 04/05/17 0524  . pantoprazole (PROTONIX) EC tablet 40 mg  40 mg Oral BID AC Johnson, Clanford L, MD   40 mg at 04/07/17 0803  . risperiDONE (RISPERDAL) tablet 4 mg  4 mg Oral QHS Johnson, Clanford L, MD   4 mg at 04/06/17 2147  . senna-docusate (Senokot-S) tablet 1 tablet  1 tablet Oral BID Irwin Brakeman L, MD   1 tablet at 04/06/17 2147  . sodium chloride flush (NS) 0.9 % injection 3 mL  3 mL Intravenous Q12H Johnson, Clanford L, MD   3 mL at 04/06/17 2152  . sodium chloride flush (NS) 0.9 % injection 3 mL  3 mL Intravenous PRN Johnson, Clanford L, MD      . traZODone (DESYREL) tablet 200 mg  200 mg Oral QHS Johnson, Clanford L, MD   200 mg at 04/06/17 2147  . vitamin B-12 (CYANOCOBALAMIN) tablet 1,000 mcg  1,000 mcg Oral Daily Wynetta Emery, Clanford L, MD   1,000 mcg at 04/06/17 1155     Discharge Medications: Please see discharge summary for a list of discharge medications.  Relevant Imaging Results:  Relevant Lab Results:   Additional Information    Furious Chiarelli, Clydene Pugh, LCSW

## 2017-04-07 NOTE — Progress Notes (Signed)
Per Dr. Laural Golden, he has spoken with pathologist and results should be back on 3/26.

## 2017-04-07 NOTE — Progress Notes (Signed)
Progress Note    Cassidy Smith  Cassidy Smith:[id number] DOB: 04-08-1955  DOA: 04/03/2017 PCP: Neale Burly, MD    Brief Narrative:   Chief complaint: follow up rectal bleeding.  Medical records reviewed and are as summarized below:  Cassidy Smith is an 62 y.o. 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 In the ED, she was Hemoccult-positive with a relatively stable hemoglobin of 10.8 mg/dL GI consulted.Underwent colonoscopy on 04/05/17 which showed a large rectal polyp which was resected.  Assessment/Plan:   Principal Problem:   GI bleed secondary to a rectal polyp in the setting of chronic anticoagulation Status post resection with pathology pending. Hemoglobin stable.  Active Problems:   Severe protein calorie malnutrition Body mass index is 18.22 kg/m.  Continue supplemental nutrition per dietitian recommendations.    Acute blood loss anemia Continue iron.    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 We'll be able to resume today pending pathology.    Hyponatremia Appears to be chronic. TSH WNL, cortisol WNL, urine osmolality pending,urine sodium pending.    History of depression/Hx of anxiety disorder Continue Depakote, Risperdal, Cogentin.    Hypomagnesemia Monitor and replace magnesium as needed.    S/P ORIF (open reduction internal fixation) fracture right hip IM nail 03/07/17/Anticoagulated Blood thinners currently on hold.    Current every day smoker Counseled on cessation.    Hyperthyroidism ContinueTapazole.    Stage I Pressure injury of skin right hip Continue foam dressing to right hip.  Family Communication/Anticipated D/C date and plan/Code Status   DVT prophylaxis: TED hose ordered. Code Status: Full Code.  Family Communication: No family at the bedside. Disposition Plan: Home if no surgical  indication found.  Awaiting pathology report.   Medical Consultants:    Gastroenterology   Anti-Infectives:    None  Subjective:   Patient reports that she has not had a bowel movement yet today.  No presyncope, dizziness, chest pain or shortness of breath.  Objective:    Vitals:   04/06/17 0650 04/06/17 1500 04/06/17 2004 04/07/17 0456  BP: 122/68 106/62 103/60 128/71  Pulse: 68 78 77 65  Resp: 20 18 16 18   Temp: 98.4 F (36.9 C) 98.4 F (36.9 C) 98.3 F (36.8 C) 98.3 F (36.8 C)  TempSrc: Oral Oral Oral Oral  SpO2: 95% 97% 97% 98%  Weight:      Height:        Intake/Output Summary (Last 24 hours) at 04/07/2017 0849 Last data filed at 04/07/2017 0456 Gross per 24 hour  Intake 240 ml  Output 400 ml  Net -160 ml   Filed Weights   04/03/17 0953 04/03/17 1342  Weight: 50.8 kg (112 lb) 51.2 kg (112 lb 14 oz)    Exam: General: Elderly female.  No acute distress. Cardiovascular: Heart sounds show a regular rate, and rhythm. No gallops or rubs. No murmurs. No JVD. Lungs: Clear to auscultation bilaterally with good air movement. No rales, rhonchi or wheezes. Abdomen: Soft, nontender, nondistended with normal active bowel sounds. No masses. No hepatosplenomegaly. Neurological: Alert and oriented 3. Moves all extremities 4 with equal strength. Cranial nerves II through XII grossly intact. Skin: Warm and dry. No rashes or lesions. Extremities: No clubbing or cyanosis. No edema. Pedal pulses 2+. Psychiatric: Mood and affect are normal. Insight and judgment are normal.   Data Reviewed:  I have personally reviewed following labs and imaging studies:  Labs: Labs show the following:   Basic Metabolic Panel: Recent Labs  Lab 04/03/17 1031 04/03/17 1042 04/04/17 0456 04/05/17 0738 04/06/17 0718 04/07/17 0550  NA 131*  --  129* 124* 126* 129*  K 4.1  --  4.3 4.1 4.0 3.9  CL 94*  --  93* 90* 90* 93*  CO2 29  --  30 26 27 30   GLUCOSE 97  --  82 104* 85 89    BUN 11  --  9 10 7 7   CREATININE 0.43*  --  0.42* 0.43* 0.35* 0.41*  CALCIUM 8.8*  --  9.3 9.4 8.6* 9.0  MG  --  1.4* 1.6*  --   --   --    GFR Estimated Creatinine Clearance: 58.2 mL/min (A) (by C-G formula based on SCr of 0.41 mg/dL (L)). Liver Function Tests: Recent Labs  Lab 04/03/17 1031  AST 14*  ALT 9*  ALKPHOS 211*  BILITOT 0.5  PROT 5.8*  ALBUMIN 2.6*   Coagulation profile Recent Labs  Lab 04/03/17 1031 04/04/17 0456  INR 1.06 1.04    CBC: Recent Labs  Lab 04/03/17 1031 04/04/17 0456 04/05/17 0738 04/06/17 0718 04/07/17 0550  WBC 7.4 9.2 8.9 9.2 7.7  NEUTROABS 4.4  --   --   --   --   HGB 10.8* 10.7* 11.0* 10.7* 10.1*  HCT 34.6* 33.6* 34.5* 32.9* 31.6*  MCV 94.0 91.6 91.5 90.1 90.5  PLT 296 299 264 251 217   Thyroid function studies: Recent Labs    04/05/17 1910  TSH 0.741   Sepsis Labs: Recent Labs  Lab 04/03/17 1039 04/04/17 0456 04/05/17 0738 04/06/17 0718 04/07/17 0550  WBC  --  9.2 8.9 9.2 7.7  LATICACIDVEN 0.75  --   --   --   --     Microbiology Recent Results (from the past 240 hour(s))  MRSA PCR Screening     Status: None   Collection Time: 04/04/17 12:03 AM  Result Value Ref Range Status   MRSA by PCR NEGATIVE NEGATIVE Final    Comment:        The GeneXpert MRSA Assay (FDA approved for NASAL specimens only), is one component of a comprehensive MRSA colonization surveillance program. It is not intended to diagnose MRSA infection nor to guide or monitor treatment for MRSA infections. Performed at [facility], [address] [place], [place] [address]     Procedures and diagnostic studies:  Dg Chest 2 View  Result Date: 04/05/2017 CLINICAL DATA:  Hyponatremia and weight loss EXAM: CHEST - 2 VIEW COMPARISON:  03/06/2017 FINDINGS: Cardiac shadow is stable. The lungs are hyperinflated. Mild blunting of the costophrenic angle is noted on the left likely related to small effusion. No focal infiltrate is seen. No bony  abnormality is noted. IMPRESSION: Small pleural effusion on the left. No other focal abnormality is seen. Electronically Signed   By: Inez Catalina M.D.   On: 04/05/2017 19:23    Medications:   . benztropine  1 mg Oral BID  . divalproex  1,500 mg Oral QHS  . feeding supplement (ENSURE ENLIVE)  237 mL Oral BID BM  . ferrous sulfate  325 mg Oral Q breakfast  . methimazole  10 mg Oral Daily  . nicotine  14 mg Transdermal Daily  . pantoprazole  40 mg Oral BID AC  . risperiDONE  4 mg Oral QHS  . senna-docusate  1 tablet Oral  BID  . sodium chloride flush  3 mL Intravenous Q12H  . traZODone  200 mg Oral QHS  . vitamin B-12  1,000 mcg Oral Daily   Continuous Infusions: . sodium chloride       LOS: 4 days   Jacquelynn Cree  [facility] Pager [phone]. If unable to reach me by pager, please call my cell phone at [phone].  *Please refer to [email or web address], password [email or web address] to get updated schedule on who will round on this patient, as hospitalists switch teams weekly. If 7PM-7AM, please contact night-coverage at [email or web address], password [email or web address] for any overnight needs.  04/07/2017, 8:49 AM

## 2017-04-08 ENCOUNTER — Encounter (HOSPITAL_COMMUNITY): Payer: Self-pay | Admitting: Internal Medicine

## 2017-04-08 DIAGNOSIS — E222 Syndrome of inappropriate secretion of antidiuretic hormone: Secondary | ICD-10-CM

## 2017-04-08 DIAGNOSIS — K621 Rectal polyp: Secondary | ICD-10-CM | POA: Diagnosis present

## 2017-04-08 HISTORY — DX: Syndrome of inappropriate secretion of antidiuretic hormone: E22.2

## 2017-04-08 LAB — BASIC METABOLIC PANEL
Anion gap: 8 (ref 5–15)
BUN: 15 mg/dL (ref 6–20)
CALCIUM: 9.3 mg/dL (ref 8.9–10.3)
CO2: 29 mmol/L (ref 22–32)
CREATININE: 0.4 mg/dL — AB (ref 0.44–1.00)
Chloride: 89 mmol/L — ABNORMAL LOW (ref 101–111)
GFR calc Af Amer: >60 mL/min (ref >60)
Glucose, Bld: 90 mg/dL (ref 65–99)
Potassium: 4.1 mmol/L (ref 3.5–5.1)
SODIUM: 126 mmol/L — AB (ref 135–145)

## 2017-04-08 LAB — CBC
HCT: 32.8 % — ABNORMAL LOW (ref 36.0–46.0)
Hemoglobin: 10.5 g/dL — ABNORMAL LOW (ref 12.0–15.0)
MCH: 29 pg (ref 26.0–34.0)
MCHC: 32 g/dL (ref 30.0–36.0)
MCV: 90.6 fL (ref 78.0–100.0)
PLATELETS: 222 10*3/uL (ref 150–400)
RBC: 3.62 MIL/uL — ABNORMAL LOW (ref 3.87–5.11)
RDW: 18.6 % — AB (ref 11.5–15.5)
WBC: 7.1 10*3/uL (ref 4.0–10.5)

## 2017-04-08 LAB — GLUCOSE, CAPILLARY
Glucose-Capillary: 149 mg/dL — ABNORMAL HIGH (ref 65–99)
Glucose-Capillary: 114 mg/dL — ABNORMAL HIGH (ref 65–99)

## 2017-04-08 LAB — SODIUM, URINE, RANDOM: SODIUM UR: 61 mmol/L

## 2017-04-08 LAB — OSMOLALITY, URINE: Osmolality, Ur: 568 mOsm/kg (ref 300–900)

## 2017-04-08 NOTE — Progress Notes (Addendum)
    Subjective: Clinically improved/stable. Eating breakfast, tolerating well. Denies any GI symptoms. No rectal bleeding in the past 24 hours.  Objective: Vital signs in last 24 hours: Temp:  [97.8 F (36.6 C)-98.3 F (36.8 C)] 97.9 F (36.6 C) (03/26 0343) Pulse Rate:  [67-78] 67 (03/26 0343) Resp:  [18-19] 18 (03/26 0343) BP: (97-107)/(63-70) 107/66 (03/26 0343) SpO2:  [96 %-97 %] 97 % (03/26 0343) Last BM Date: 04/07/17 General:   Alert and oriented, pleasant Head:  Normocephalic and atraumatic. Eyes:  No icterus, sclera clear. Conjuctiva pink.  Heart:  S1, S2 present, no murmurs noted.  Lungs: Clear to auscultation bilaterally, without wheezing, rales, or rhonchi.  Abdomen:  Bowel sounds present, soft, non-tender, non-distended. No HSM or hernias noted. No rebound or guarding. No masses appreciated  Msk:  Symmetrical without gross deformities. Pulses:  Normal bilateral DP pulses noted. Extremities:  Without clubbing or edema. Neurologic:  Alert and  oriented x4;  grossly normal neurologically. Skin:  Warm and dry, intact without significant lesions.  Psych:  Alert and cooperative. Normal mood and affect.  Intake/Output from previous day: 03/25 0701 - 03/26 0700 In: 963 [P.O.:960; I.V.:3] Out: 2000 [Urine:2000] Intake/Output this shift: No intake/output data recorded.  Lab Results: Recent Labs    04/06/17 0718 04/07/17 0550 04/08/17 0501  WBC 9.2 7.7 7.1  HGB 10.7* 10.1* 10.5*  HCT 32.9* 31.6* 32.8*  PLT 251 217 222   BMET Recent Labs    04/06/17 0718 04/07/17 0550 04/08/17 0501  NA 126* 129* 126*  K 4.0 3.9 4.1  CL 90* 93* 89*  CO2 27 30 29   GLUCOSE 85 89 90  BUN 7 7 15   CREATININE 0.35* 0.41* 0.40*  CALCIUM 8.6* 9.0 9.3   LFT No results for input(s): PROT, ALBUMIN, AST, ALT, ALKPHOS, BILITOT, BILIDIR, IBILI in the last 72 hours. PT/INR No results for input(s): LABPROT, INR in the last 72 hours. Hepatitis Panel No results for input(s): HEPBSAG,  HCVAB, HEPAIGM, HEPBIGM in the last 72 hours.   Studies/Results: No results found.  Assessment: 62 year old female admitted with rectal bleeding on anticoagulation, held since admission. Colonoscopy 3/23 with large rectal polyp, removed piecemeal with incomplete resection. Pathology pending. If histology negative for carcinoma, early interval colonoscopy in next few weeks recommended for complete removal. If positive for carcinoma, will need surgical consultation. Hgb remains stable. Anticipate restarting Xarelto soon, after review of pathology.   Today hgb remains stable, clinically improved. Post-colonoscopy low-volume rectal bleeding reported per patient not hemodynamically significant; none today. Pathology should be back today 3/26 per pathologist. Feels well, tolerating diet.   Plan: 1. Await surgical pathology (should be back today): repeat colonoscopy 2-3 weeks vs surgical consult. 2. Supportive measures. 3. Monitor for further GI bleed. 4. Follow hgb.   Thank you for allowing [place] to participate in the care of [place], DNP, AGNP-C Adult & Gerontological Nurse Practitioner [facility]    LOS: 5 days    04/08/2017, 8:09 AM

## 2017-04-08 NOTE — Progress Notes (Signed)
Progress Note    Cassidy Smith  WNI:[id number] DOB: 12-16-1955  DOA: 04/03/2017 PCP: Neale Burly, MD    Brief Narrative:   Chief complaint: follow up rectal bleeding.  Medical records reviewed and are as summarized below:  Cassidy Smith is an 62 y.o. 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 In the ED, she was Hemoccult-positive with a relatively stable hemoglobin of 10.8 mg/dL GI consulted.Underwent colonoscopy on 04/05/17 which showed a large rectal polyp which was resected.  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 Surgery consulted for possible resection.Hemoglobin remains stable.  Active Problems:   Severe protein calorie malnutrition Body mass index is 18.22 kg/m.  Continue supplemental nutrition per dietitian recommendations.    Acute blood loss anemia Continue iron.    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 We'll be able to resume today pending pathology.    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 TSH WNL, cortisol WNL, urine osmolality 356, urine sodium 61. Findings consistent with SIADH. Restrict water.    History of depression/Hx of anxiety disorder Continue Depakote, Risperdal, Cogentin.    Hypomagnesemia Monitor and replace magnesium as needed.    S/P ORIF (open reduction internal fixation) fracture right hip IM nail 03/07/17/Anticoagulated Blood thinners currently on hold.    Current every day smoker Counseled on cessation.    Hyperthyroidism Continue Tapazole.    Stage I Pressure injury of skin right hip Continue foam dressing to right hip.  Family  Communication/Anticipated D/C date and plan/Code Status   DVT prophylaxis: TED hose ordered. Code Status: Full Code.  Family Communication: No family at the bedside. Disposition Plan: Pending surgical evaluation.   Medical Consultants:    Gastroenterology   Anti-Infectives:    None  Subjective:   The patient reports that she moved her bowels last night there was no obvious blood. Feels well overall. No nausea or vomiting.Reports some knee pain, but says it is better since the nurse gave her pain medication.  Objective:    Vitals:   04/07/17 0456 04/07/17 1400 04/07/17 2018 04/08/17 0343  BP: 128/71 97/63 97/70  107/66  Pulse: 65 78 73 67  Resp: 18 18 19 18   Temp: 98.3 F (36.8 C) 98.3 F (36.8 C) 97.8 F (36.6 C) 97.9 F (36.6 C)  TempSrc: Oral Oral Oral Oral  SpO2: 98% 97% 96% 97%  Weight:      Height:        Intake/Output Summary (Last 24 hours) at 04/08/2017 0751 Last data filed at 04/08/2017 0344 Gross per 24 hour  Intake 963 ml  Output 2000 ml  Net -1037 ml   Filed Weights   04/03/17 0953 04/03/17 1342  Weight: 50.8 kg (112 lb) 51.2 kg (112 lb 14 oz)    Exam: General: No acute distress. Cardiovascular: Heart sounds show a regular rate, and rhythm. No gallops or rubs. No murmurs. No JVD. Lungs: Clear to auscultation bilaterally with good air movement. No rales, rhonchi or wheezes. Abdomen: Soft, nontender, nondistended with normal active bowel sounds. No masses. No hepatosplenomegaly. Skin: Warm and dry. No rashes or lesions. Extremities: No clubbing or cyanosis. No edema. Pedal pulses 2+.  Data Reviewed:   I have personally reviewed following labs and imaging studies:  Labs: Labs show the following:   Basic Metabolic Panel: Recent Labs  Lab 04/03/17 1042 04/04/17 0456 04/05/17 0738 04/06/17 0718 04/07/17 0550 04/08/17 0501  NA  --  129* 124* 126* 129* 126*  K  --  4.3 4.1 4.0 3.9 4.1  CL  --  93* 90* 90* 93* 89*  CO2  --  30 26 27  30 29   GLUCOSE  --  82 104* 85 89 90  BUN  --  9 10 7 7 15   CREATININE  --  0.42* 0.43* 0.35* 0.41* 0.40*  CALCIUM  --  9.3 9.4 8.6* 9.0 9.3  MG 1.4* 1.6*  --   --   --   --    GFR Estimated Creatinine Clearance: 58.2 mL/min (A) (by C-G formula based on SCr of 0.4 mg/dL (L)). Liver Function Tests: Recent Labs  Lab 04/03/17 1031  AST 14*  ALT 9*  ALKPHOS 211*  BILITOT 0.5  PROT 5.8*  ALBUMIN 2.6*   Coagulation profile Recent Labs  Lab 04/03/17 1031 04/04/17 0456  INR 1.06 1.04    CBC: Recent Labs  Lab 04/03/17 1031 04/04/17 0456 04/05/17 0738 04/06/17 0718 04/07/17 0550 04/08/17 0501  WBC 7.4 9.2 8.9 9.2 7.7 7.1  NEUTROABS 4.4  --   --   --   --   --   HGB 10.8* 10.7* 11.0* 10.7* 10.1* 10.5*  HCT 34.6* 33.6* 34.5* 32.9* 31.6* 32.8*  MCV 94.0 91.6 91.5 90.1 90.5 90.6  PLT 296 299 264 251 217 222   Thyroid function studies: Recent Labs    04/05/17 1910  TSH 0.741   Microbiology Recent Results (from the past 240 hour(s))  MRSA PCR Screening     Status: None   Collection Time: 04/04/17 12:03 AM  Result Value Ref Range Status   MRSA by PCR NEGATIVE NEGATIVE Final    Comment:        The GeneXpert MRSA Assay (FDA approved for NASAL specimens only), is one component of a comprehensive MRSA colonization surveillance program. It is not intended to diagnose MRSA infection nor to guide or monitor treatment for MRSA infections. Performed at [facility], [address], [place], [place] [address]     Procedures and diagnostic studies:  No results found.  Medications:   . benztropine  1 mg Oral BID  . divalproex  1,500 mg Oral QHS  . feeding supplement (ENSURE ENLIVE)  237 mL Oral BID BM  . ferrous sulfate  325 mg Oral Q breakfast  . methimazole  10 mg Oral Daily  . nicotine  14 mg Transdermal Daily  . pantoprazole  40 mg Oral BID AC  . risperiDONE  4 mg Oral QHS  . senna-docusate  1 tablet Oral BID  . sodium chloride flush  3 mL Intravenous Q12H    . traZODone  200 mg Oral QHS  . vitamin B-12  1,000 mcg Oral Daily   Continuous Infusions: . sodium chloride       LOS: 5 days   Jacquelynn Cree  [facility] Pager [phone]. If unable to reach me by pager, please call my cell phone at [phone].  *Please refer to [email or web address], password [email or web address] to get updated schedule on who will round on this patient, as hospitalists switch teams weekly. If 7PM-7AM, please contact night-coverage at [email or web address], password [email or web address] for any overnight needs.  04/08/2017, 7:51 AM

## 2017-04-08 NOTE — Progress Notes (Signed)
Received pathology back from colonoscopy:  1. Rectosigmoid polyp found tubular adenoma with no high grade dysplasia  2. Large (78mm+) rectal polyp tubulovillous adenoma with at least high grade glandular                          Dysplasia  Discussed with Dr. Oneida Alar, will consult surgery for surgical options.   Thank you for allowing [place] to participate in the care of Campus, DNP, AGNP-C Adult & Gerontological Nurse Practitioner [facility]

## 2017-04-08 NOTE — Consult Note (Signed)
[facility] Consult  Reason for Consult: Rectal polyp with high grade dysplasia  Referring Physician:  Dr. Rama/ Dr. Oneida Alar / Dr. Laural Golden   Chief Complaint    Rectal Bleeding      Cassidy Smith is a 62 y.o. female.  HPI: Ms. Cassidy Smith is a 62 yo who was admitted with a LGIB.  She has been on Xarelto daily for DVT treatment and presented to the ED from [facility] with maroon bleeding from her rectum for about 1 week.  She was weak but otherwise no complaints.  She had been sent to [facility] after she fell and fractured her hip and had surgery with Dr. Aline Brochure some weeks ago and then had the complication of the DVT.    She reported having a colonoscopy in the past that was normal but could not recall the dates.  Since her admission she underwent a colonoscopy with Dr. Laural Golden that demonstrated a rectosigmoid polyp and a high rectal polyp based on his description to me and based on his documentation. The rectal polyp was 5 cm and was removed partially with piecemeal method and returned with some high grade dysplasia.   Past Medical History:  Diagnosis Date  . Anxiety   . Bipolar 1 disorder ([place])   . Chronic hip pain   . Chronic kidney disease    uti  currently,  hx bladder spasms  . Depression   . Headache(784.0)   . Hyperthyroidism   . Neuromuscular disorder (HCC)    shaking of hands   . 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 Stroke [facility])    mini    Past Surgical History:  Procedure Laterality Date  . COLONOSCOPY WITH PROPOFOL N/A 04/05/2017   Procedure: COLONOSCOPY WITH PROPOFOL;  Surgeon: Rogene Houston, MD;  Location: [facility] ENDO SU[facility];  Service: Endoscopy;  Laterality: N/A;  . INTRAMEDULLARY (IM) NAIL INTERTROCHANTERIC Right 03/07/2017   Procedure: OPEN TREATMENT INTERNAL FIXATION RIGHT HIP WITH GAMA INTRAMEDULARY NAIL;  Surgeon: Carole Civil, MD;  Location: [facility] ORS;  Service: Orthopedics;  Laterality: Right;  .  MULTIPLE EXTRACTIONS WITH ALVEOLOPLASTY N/A 10/30/2012   Procedure: MULTIPLE EXTRACION 5, 6, 8, 9, 10 ,18, 19, 31 WITH MAXILLARY RIGHT AND LEFT  ALVEOLOPLASTY REDUCE MAXILLARY LEFT TUBEROSITY;  Surgeon: Gae Bon, DDS;  Location: [place];  Service: Oral Surgery;  Laterality: N/A;  . POLYPECTOMY  04/05/2017   Procedure: POLYPECTOMY;  Surgeon: Rogene Houston, MD;  Location: [facility];  Service: Endoscopy;;  recto-sigmoid, rectum   Reported to me Sister with Colon cancer  Family History  Problem Relation Age of Onset  . Breast cancer Mother   . Cancer - Other Mother   . Alcoholism Father   . Colon cancer Neg Hx   . Colon polyps Neg Hx     Social History   Tobacco Use  . Smoking status: Current Every Day Smoker    Packs/day: 0.50    Years: 20.00    Pack years: 10.00    Types: Cigarettes  . Smokeless tobacco: Never Used  Substance Use Topics  . Alcohol use: No  . Drug use: No    Medications:  I have reviewed the patient's current medications. Prior to Admission:  Medications Prior to Admission  Medication Sig Dispense Refill Last Dose  . AMINO ACIDS-PROTEIN HYDROLYS PO Take 30 mEq by mouth 2 (two) times daily.   04/03/2017 at Unknown time  . aspirin EC 81 MG EC  tablet Take 1 tablet (81 mg total) by mouth daily.   04/03/2017 at Unknown time  . benztropine (COGENTIN) 1 MG tablet Take 1 mg by mouth 2 (two) times daily.   04/03/2017 at Unknown time  . diclofenac sodium (VOLTAREN) 1 % GEL Apply 2 g topically 4 (four) times daily. 2 Tube 0 04/03/2017 at Unknown time  . divalproex (DEPAKOTE ER) 500 MG 24 hr tablet Take 1,500 mg by mouth at bedtime.    04/02/2017 at Unknown time  . ferrous sulfate 325 (65 FE) MG tablet Take 1 tablet (325 mg total) by mouth daily with breakfast. 30 tablet 0 04/03/2017 at Unknown time  . methimazole (TAPAZOLE) 10 MG tablet Take 10 mg by mouth daily.   0 04/03/2017 at Unknown time  . Multiple Vitamins-Minerals (CENTRUM SILVER ADULT 50+ PO) Take 1 tablet by  mouth every morning.   04/03/2017 at Unknown time  . pantoprazole (PROTONIX) 40 MG tablet Take 1 tablet (40 mg total) by mouth 2 (two) times daily before a meal. 60 tablet 0 04/03/2017 at Unknown time  . potassium chloride SA (K-DUR,KLOR-CON) 20 MEQ tablet Take 20 mEq by mouth daily.   Past Week at Unknown time  . RISPERDAL CONSTA 50 MG injection Inject 50 mg into the muscle every 14 (fourteen) days.  0 Past Week at Unknown time  . risperidone (RISPERDAL) 4 MG tablet Take 4 mg by mouth at bedtime.  0 04/02/2017 at Unknown time  . Rivaroxaban (XARELTO) 15 MG TABS tablet Take 15 mg by mouth 2 (two) times daily with a meal. For 20 days starting 03/14/2017 at 2000.   04/02/2017 at 2000  . senna-docusate (SENOKOT-S) 8.6-50 MG tablet Take 1 tablet by mouth 2 (two) times daily. 30 tablet 0 04/03/2017 at Unknown time  . traZODone (DESYREL) 100 MG tablet Take 200 mg by mouth at bedtime.   04/02/2017 at Unknown time  . vitamin B-12 (CYANOCOBALAMIN) 1000 MCG tablet Take 1,000 mcg by mouth daily.   04/03/2017 at Unknown time  . feeding supplement, ENSURE ENLIVE, (ENSURE ENLIVE) LIQD Take 237 mLs by mouth 2 (two) times daily between meals. 237 mL 0 Taking   Scheduled: . benztropine  1 mg Oral BID  . divalproex  1,500 mg Oral QHS  . feeding supplement (ENSURE ENLIVE)  237 mL Oral BID BM  . ferrous sulfate  325 mg Oral Q breakfast  . methimazole  10 mg Oral Daily  . nicotine  14 mg Transdermal Daily  . pantoprazole  40 mg Oral BID AC  . risperiDONE  4 mg Oral QHS  . senna-docusate  1 tablet Oral BID  . sodium chloride flush  3 mL Intravenous Q12H  . traZODone  200 mg Oral QHS  . vitamin B-12  1,000 mcg Oral Daily   Continuous: . sodium chloride     HRC:BULAGT chloride, acetaminophen **OR** acetaminophen, bisacodyl, ondansetron **OR** ondansetron (ZOFRAN) IV, sodium chloride flush  Allergies: No Known Allergies  ROS:  A comprehensive review of systems was negative except for: Gastrointestinal: positive  for recent GIB  Blood pressure 107/66, pulse 67, temperature 97.9 F (36.6 C), temperature source Oral, resp. rate 18, height 5' 6"  (1.676 m), weight 112 lb 14 oz (51.2 kg), SpO2 97 %. Physical Exam  Constitutional: She is oriented to person, place, and time and well-developed, well-nourished, and in no distress.  HENT:  Head: Normocephalic.  Multiple missing teeth  Eyes: Pupils are equal, round, and reactive to light.  Neck: Normal range of motion.  Cardiovascular: Normal rate.  Pulmonary/Chest: Effort normal.  Abdominal: Soft. She exhibits no distension. There is no tenderness.  Musculoskeletal: Normal range of motion. She exhibits no edema.  Neurological: She is alert and oriented to person, place, and time.  Skin: Skin is warm and dry.  Psychiatric: Mood, memory, affect and judgment normal.  Vitals reviewed.   Results: Results for orders placed or performed during the hospital encounter of 04/03/17 (from the past 48 hour(s))  CBC     Status: Abnormal   Collection Time: 04/07/17  5:50 AM  Result Value Ref Range   WBC 7.7 4.0 - 10.5 K/uL   RBC 3.49 (L) 3.87 - 5.11 MIL/uL   Hemoglobin 10.1 (L) 12.0 - 15.0 g/dL   HCT 31.6 (L) 36.0 - 46.0 %   MCV 90.5 78.0 - 100.0 fL   MCH 28.9 26.0 - 34.0 pg   MCHC 32.0 30.0 - 36.0 g/dL   RDW 18.7 (H) 11.5 - 15.5 %   Platelets 217 150 - 400 K/uL    Comment: Performed at [facility], [address], [place], [place] [address]  Basic metabolic panel     Status: Abnormal   Collection Time: 04/07/17  5:50 AM  Result Value Ref Range   Sodium 129 (L) 135 - 145 mmol/L   Potassium 3.9 3.5 - 5.1 mmol/L   Chloride 93 (L) 101 - 111 mmol/L   CO2 30 22 - 32 mmol/L   Glucose, Bld 89 65 - 99 mg/dL   BUN 7 6 - 20 mg/dL   Creatinine, Ser 0.41 (L) 0.44 - 1.00 mg/dL   Calcium 9.0 8.9 - 10.3 mg/dL   GFR calc non Af Amer >60 >60 mL/min   GFR calc Af Amer >60 >60 mL/min    Comment: (NOTE) The eGFR has been calculated using the CKD EPI equation. This  calculation has not been validated in all clinical situations. eGFR's persistently <60 mL/min signify possible Chronic Kidney Disease.    Anion gap 6 5 - 15    Comment: Performed at [facility], [address], [place], [place] [address]  Osmolality, urine     Status: None   Collection Time: 04/07/17 12:56 PM  Result Value Ref Range   Osmolality, Ur 356 300 - 900 mOsm/kg    Comment: Performed at [place] [address], [place], [place] [address]  Glucose, capillary     Status: Abnormal   Collection Time: 04/07/17  9:18 PM  Result Value Ref Range   Glucose-Capillary 130 (H) 65 - 99 mg/dL  Sodium, urine, random     Status: None   Collection Time: 04/07/17 10:36 PM  Result Value Ref Range   Sodium, Ur 61 mmol/L    Comment: Performed at [facility], [address], [place], [place] [address]  Osmolality, urine     Status: None   Collection Time: 04/07/17 10:37 PM  Result Value Ref Range   Osmolality, Ur 568 300 - 900 mOsm/kg    Comment: Performed at [place] [address], [place] [address]  CBC     Status: Abnormal   Collection Time: 04/08/17  5:01 AM  Result Value Ref Range   WBC 7.1 4.0 - 10.5 K/uL   RBC 3.62 (L) 3.87 - 5.11 MIL/uL   Hemoglobin 10.5 (L) 12.0 - 15.0 g/dL   HCT 32.8 (L) 36.0 - 46.0 %   MCV 90.6 78.0 - 100.0 fL   MCH 29.0 26.0 - 34.0 pg  MCHC 32.0 30.0 - 36.0 g/dL   RDW 18.6 (H) 11.5 - 15.5 %   Platelets 222 150 - 400 K/uL    Comment: Performed at [facility], [address], [place], [place] [address]  Basic metabolic panel     Status: Abnormal   Collection Time: 04/08/17  5:01 AM  Result Value Ref Range   Sodium 126 (L) 135 - 145 mmol/L   Potassium 4.1 3.5 - 5.1 mmol/L   Chloride 89 (L) 101 - 111 mmol/L   CO2 29 22 - 32 mmol/L   Glucose, Bld 90 65 - 99 mg/dL   BUN 15 6 - 20 mg/dL   Creatinine, Ser 0.40 (L) 0.44 - 1.00 mg/dL   Calcium 9.3 8.9 - 10.3 mg/dL   GFR calc non Af Amer >60 >60 mL/min   GFR calc Af Amer >60 >60  mL/min    Comment: (NOTE) The eGFR has been calculated using the CKD EPI equation. This calculation has not been validated in all clinical situations. eGFR's persistently <60 mL/min signify possible Chronic Kidney Disease.    Anion gap 8 5 - 15    Comment: Performed at [facility], [address], [place], [place] [address]  Glucose, capillary     Status: Abnormal   Collection Time: 04/08/17  8:21 AM  Result Value Ref Range   Glucose-Capillary 149 (H) 65 - 99 mg/dL  Glucose, capillary     Status: Abnormal   Collection Time: 04/08/17 10:57 AM  Result Value Ref Range   Glucose-Capillary 114 (H) 65 - 99 mg/dL    Colonoscopy 3/22 - Dr. Laural Golden- report does not indicate the measurement of the location of the lesions; Dr. Laural Golden reported to me that he could retroflex to get to the lesion and was able to remove the majority of it piecemeal     Assessment & Plan:  Cassidy Smith is a 62 y.o. female with a large rectal polyp that was removed partially in piecemeal by Dr. Laural Golden. It does not have any carcinoma but there is some high grade dysplasia.  She reported to me a sister with colon cancer but other history report does not document this cancer.    -Have discussed with Dr.Rehman the Endoscopist. He reported that he could retroflex to get to the lesion and had initially felt that he could remove the remaining lesion in the upcoming weeks.   -Some discussion between GI - Dr. Oneida Alar and Dr. Laural Golden regarding the possibility of a transanal excision and given the location an the description in Dr. Olevia Perches note and the fact that he can retroflex, I think this is high rectum and is not amenable to transanal excision at our facility.  It may be able to be excised at a facility with specialized equipment that allows for a combination of endoscopic/ anoscopic instrumentation, but again could not be done here.   -The patient would possibly benefit from a repeat attempt at endoscopy and removal but if  transanal excision is what is desired she would need to be referred to a facility with that capability  -No plans for surgery at [facility]. No need to follow up with [facility] since we cannot offer the transanal excision.  If something changes and a repeat endoscopy is performed and the location is determined to be lower or even higher, then she can be referred back by GI at that time.   All questions were answered to the satisfaction of the patient.   Ria Comment  Jeanette Caprice 04/08/2017, 2:37 PM

## 2017-04-09 ENCOUNTER — Other Ambulatory Visit: Payer: Self-pay | Admitting: *Deleted

## 2017-04-09 ENCOUNTER — Inpatient Hospital Stay (HOSPITAL_COMMUNITY): Payer: Medicaid Other

## 2017-04-09 ENCOUNTER — Telehealth: Payer: Self-pay | Admitting: Gastroenterology

## 2017-04-09 DIAGNOSIS — K621 Rectal polyp: Secondary | ICD-10-CM

## 2017-04-09 LAB — GLUCOSE, CAPILLARY: Glucose-Capillary: 89 mg/dL (ref 65–99)

## 2017-04-09 MED ORDER — RIVAROXABAN 20 MG PO TABS
20.0000 mg | ORAL_TABLET | Freq: Every day | ORAL | Status: DC
Start: 2017-04-09 — End: 2017-04-17
  Administered 2017-04-09 – 2017-04-16 (×8): 20 mg via ORAL
  Filled 2017-04-09 (×8): qty 1

## 2017-04-09 MED ORDER — MAGNESIUM OXIDE 400 (241.3 MG) MG PO TABS
400.0000 mg | ORAL_TABLET | Freq: Two times a day (BID) | ORAL | Status: DC
  Administered 2017-04-09 – 2017-04-17 (×17): 400 mg via ORAL
  Filled 2017-04-09 (×17): qty 1

## 2017-04-09 MED ORDER — SODIUM CHLORIDE 0.9 % IV SOLN
INTRAVENOUS | Status: AC
  Administered 2017-04-09 – 2017-04-10 (×3): via INTRAVENOUS

## 2017-04-09 NOTE — Progress Notes (Addendum)
ANTICOAGULATION CONSULT NOTE - Initial Consult  Pharmacy Consult for xarelto Indication: history of DVT (03/13/17)  No Known Allergies  Patient Measurements: Height: 5\' 6"  (167.6 cm) Weight: 112 lb 14 oz (51.2 kg) IBW/kg (Calculated) : 59.3   Vital Signs: Temp: 98 F (36.7 C) (03/27 1427) Temp Source: Oral (03/27 1427) BP: 99/60 (03/27 1427) Pulse Rate: 69 (03/27 1427)  Labs: Recent Labs    04/07/17 0550 04/08/17 0501  HGB 10.1* 10.5*  HCT 31.6* 32.8*  PLT 217 222  CREATININE 0.41* 0.40*    Estimated Creatinine Clearance: 58.2 mL/min (A) (by C-G formula based on SCr of 0.4 mg/dL (L)).   Medical History: Past Medical History:  Diagnosis Date  . Anxiety   . Bipolar 1 disorder ([place])   . Chronic hip pain   . Chronic kidney disease    uti  currently,  hx bladder spasms  . Depression   . Headache(784.0)   . Hyperthyroidism   . Neuromuscular disorder (HCC)    shaking of hands   . SIADH (syndrome of inappropriate ADH production) ([place]) 04/08/2017  . Stroke [facility])    mini    Medications:  Medications Prior to Admission  Medication Sig Dispense Refill Last Dose  . AMINO ACIDS-PROTEIN HYDROLYS PO Take 30 mEq by mouth 2 (two) times daily.   04/03/2017 at Unknown time  . aspirin EC 81 MG EC tablet Take 1 tablet (81 mg total) by mouth daily.   04/03/2017 at Unknown time  . benztropine (COGENTIN) 1 MG tablet Take 1 mg by mouth 2 (two) times daily.   04/03/2017 at Unknown time  . diclofenac sodium (VOLTAREN) 1 % GEL Apply 2 g topically 4 (four) times daily. 2 Tube 0 04/03/2017 at Unknown time  . divalproex (DEPAKOTE ER) 500 MG 24 hr tablet Take 1,500 mg by mouth at bedtime.    04/02/2017 at Unknown time  . ferrous sulfate 325 (65 FE) MG tablet Take 1 tablet (325 mg total) by mouth daily with breakfast. 30 tablet 0 04/03/2017 at Unknown time  . methimazole (TAPAZOLE) 10 MG tablet Take 10 mg by mouth daily.   0 04/03/2017 at Unknown time  . Multiple Vitamins-Minerals (CENTRUM SILVER  ADULT 50+ PO) Take 1 tablet by mouth every morning.   04/03/2017 at Unknown time  . pantoprazole (PROTONIX) 40 MG tablet Take 1 tablet (40 mg total) by mouth 2 (two) times daily before a meal. 60 tablet 0 04/03/2017 at Unknown time  . potassium chloride SA (K-DUR,KLOR-CON) 20 MEQ tablet Take 20 mEq by mouth daily.   Past Week at Unknown time  . RISPERDAL CONSTA 50 MG injection Inject 50 mg into the muscle every 14 (fourteen) days.  0 Past Week at Unknown time  . risperidone (RISPERDAL) 4 MG tablet Take 4 mg by mouth at bedtime.  0 04/02/2017 at Unknown time  . Rivaroxaban (XARELTO) 15 MG TABS tablet Take 15 mg by mouth 2 (two) times daily with a meal. For 20 days starting 03/14/2017 at 2000.   04/02/2017 at 2000  . senna-docusate (SENOKOT-S) 8.6-50 MG tablet Take 1 tablet by mouth 2 (two) times daily. 30 tablet 0 04/03/2017 at Unknown time  . traZODone (DESYREL) 100 MG tablet Take 200 mg by mouth at bedtime.   04/02/2017 at Unknown time  . vitamin B-12 (CYANOCOBALAMIN) 1000 MCG tablet Take 1,000 mcg by mouth daily.   04/03/2017 at Unknown time  . feeding supplement, ENSURE ENLIVE, (ENSURE ENLIVE) LIQD Take 237 mLs by mouth 2 (two) times  daily between meals. 237 mL 0 Taking    Assessment: Pharmacy has be consulted to dose Xarelto in patient with history of DVT. Started on Xarelto treatment dose on 03/13/17. Patient admitted on 3/21 for evaluation of rectal bleeding.  Goal of Therapy:   Monitor platelets by anticoagulation protocol: Yes   Plan:  Xarelto 20 mg daily with supper Monitor labs and s/s of bleeding  Ramond Craver 04/09/2017,3:21 PM

## 2017-04-09 NOTE — Progress Notes (Signed)
[facility] PROGRESS NOTE  KINDA POTTLE BPZ:[id number] DOB: [birth year] DOA: 04/03/2017 PCP: Neale Burly, MD  Brief summary   62 y.o. 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 In the ED, she was Hemoccult-positive with a relatively stable hemoglobin of 10.8 mg/dL GI consulted.Underwent colonoscopy on 04/05/17 which showed a large rectal polyp which was resected.  Assessment/Plan:  GI bleed secondary to a rectal polyp in the setting of chronic anticoagulation. 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 patient was seen by surgery and gi. Recommended to f/u at [facility] at discharge for possible transanal excision of the lesion   Severe protein calorie malnutrition. Body mass index is 18.22 kg/m.  Continue supplemental nutrition per dietitian recommendations.  Acute blood loss anemia. Continue iron. 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 Blood thinners was on hold. Will resume today, recheck Hg AM. Also will repeat doppler ultrasound to evaluate the recent embolism. If need to cont anticoagulation due to risk of bleeding vs IVC needs   Hyponatremia, unclear  Possible SIADH. Appears to be chronic. TSH WNL, cortisol WNL, urine osmolality 356, urine sodium 61. Clinically looks volume depleted, will try gentle iv NS overnight, recheck labs AM  History of depression/Hx of anxiety disorder Continue Depakote, Risperdal, Cogentin.  Hypomagnesemia Monitor and replace magnesium as needed.  S/P ORIF (open reduction internal fixation) fracture right hip IM nail 03/07/17/Anticoagulated  Current every day smoker Counseled on cessation.  Hyperthyroidism Continue Tapazole.  Stage I Pressure injury of skin right hip Continue foam dressing to right hip.   Code  Status: full Family Communication: d/w patient, RN (indicate person spoken with, relationship, and if by phone, the number) Disposition Plan: SNF in 24-48 hrs    Consultants:  GI  Surgery   Procedures:  Endoscopy   Antibiotics: Anti-infectives (From admission, onward)   None        (indicate start date, and stop date if known)  HPI/Subjective: No more bleeding episodes overnight. tolerating diet well. Still hyponatremia at 126.   Objective: Vitals:   04/08/17 2250 04/09/17 0400  BP:  128/69  Pulse:  67  Resp:  20  Temp:  98 F (36.7 C)  SpO2: 100% 96%    Intake/Output Summary (Last 24 hours) at 04/09/2017 1423 Last data filed at 04/09/2017 0400 Gross per 24 hour  Intake 240 ml  Output 700 ml  Net -460 ml   Filed Weights   04/03/17 0953 04/03/17 1342  Weight: 50.8 kg (112 lb) 51.2 kg (112 lb 14 oz)    Exam:   General:  No distress   Cardiovascular: s1,s2 rrr  Respiratory: no wheezing   Abdomen: soft, nt, nd n  Musculoskeletal: no  Leg edema    Data Reviewed: Basic Metabolic Panel: Recent Labs  Lab 04/03/17 1042 04/04/17 0456 04/05/17 0738 04/06/17 0718 04/07/17 0550 04/08/17 0501  NA  --  129* 124* 126* 129* 126*  K  --  4.3 4.1 4.0 3.9 4.1  CL  --  93* 90* 90* 93* 89*  CO2  --  30 26 27 30 29   GLUCOSE  --  82 104* 85 89 90  BUN  --  9 10 7 7 15   CREATININE  --  0.42* 0.43* 0.35* 0.41* 0.40*  CALCIUM  --  9.3 9.4 8.6* 9.0  9.3  MG 1.4* 1.6*  --   --   --   --    Liver Function Tests: Recent Labs  Lab 04/03/17 1031  AST 14*  ALT 9*  ALKPHOS 211*  BILITOT 0.5  PROT 5.8*  ALBUMIN 2.6*   No results for input(s): LIPASE, AMYLASE in the last 168 hours. No results for input(s): AMMONIA in the last 168 hours. CBC: Recent Labs  Lab 04/03/17 1031 04/04/17 0456 04/05/17 0738 04/06/17 0718 04/07/17 0550 04/08/17 0501  WBC 7.4 9.2 8.9 9.2 7.7 7.1  NEUTROABS 4.4  --   --   --   --   --   HGB 10.8* 10.7* 11.0* 10.7* 10.1* 10.5*   HCT 34.6* 33.6* 34.5* 32.9* 31.6* 32.8*  MCV 94.0 91.6 91.5 90.1 90.5 90.6  PLT 296 299 264 251 217 222   Cardiac Enzymes: No results for input(s): CKTOTAL, CKMB, CKMBINDEX, TROPONINI in the last 168 hours. BNP (last 3 results) No results for input(s): BNP in the last 8760 hours.  ProBNP (last 3 results) No results for input(s): PROBNP in the last 8760 hours.  CBG: Recent Labs  Lab 04/07/17 2118 04/08/17 0821 04/08/17 1057 04/09/17 0736  GLUCAP 130* 149* 114* 89    Recent Results (from the past 240 hour(s))  MRSA PCR Screening     Status: None   Collection Time: 04/04/17 12:03 AM  Result Value Ref Range Status   MRSA by PCR NEGATIVE NEGATIVE Final    Comment:        The GeneXpert MRSA Assay (FDA approved for NASAL specimens only), is one component of a comprehensive MRSA colonization surveillance program. It is not intended to diagnose MRSA infection nor to guide or monitor treatment for MRSA infections. Performed at [facility], [address], [place], [place] [address]      Studies: No results found.  Scheduled Meds: . benztropine  1 mg Oral BID  . divalproex  1,500 mg Oral QHS  . feeding supplement (ENSURE ENLIVE)  237 mL Oral BID BM  . ferrous sulfate  325 mg Oral Q breakfast  . methimazole  10 mg Oral Daily  . nicotine  14 mg Transdermal Daily  . pantoprazole  40 mg Oral BID AC  . risperiDONE  4 mg Oral QHS  . senna-docusate  1 tablet Oral BID  . sodium chloride flush  3 mL Intravenous Q12H  . traZODone  200 mg Oral QHS  . vitamin B-12  1,000 mcg Oral Daily   Continuous Infusions: . sodium chloride      Principal Problem:   GI bleed Active Problems:   Hyponatremia   History of depression   Hx of anxiety disorder   Orthostatic hypotension   Hypomagnesemia   S/P ORIF (open reduction internal fixation) fracture right hip IM nail 03/07/17   Anticoagulated   Stool bloody   Current every day smoker   Rectal bleeding   Chronic diarrhea    Hyperthyroidism   Pressure injury of skin   Protein-calorie malnutrition, severe   SIADH (syndrome of inappropriate ADH production) ([place])   Dysplastic rectal polyp    Time spent: >35 minutes     Kinnie Feil  [facility] Pager [phone]. If 7PM-7AM, please contact night-coverage at [email or web address], password [facility] 04/09/2017, 2:23 PM  LOS: 6 days

## 2017-04-09 NOTE — Progress Notes (Addendum)
Subjective:  No evidence of further bleeding. Denies gi symptoms.   Objective: Vital signs in last 24 hours: Temp:  [98 F (36.7 C)-98.2 F (36.8 C)] 98 F (36.7 C) (03/27 0400) Pulse Rate:  [63-70] 67 (03/27 0400) Resp:  [18-20] 20 (03/27 0400) BP: (99-133)/(67-83) 128/69 (03/27 0400) SpO2:  [96 %-100 %] 96 % (03/27 0400) Last BM Date: 04/07/17 General:   Alert,  Well-developed, well-nourished, pleasant and cooperative in NAD Head:  Normocephalic and atraumatic. Eyes:  Sclera clear, no icterus.   Abdomen:  Soft Neurologic:  Alert and  oriented x4;  grossly normal neurologically. Skin:  Intact without significant lesions or rashes. Psych:  Alert and cooperative. Normal mood and affect.  Intake/Output from previous day: 03/26 0701 - 03/27 0700 In: 720 [P.O.:720] Out: 700 [Urine:700] Intake/Output this shift: No intake/output data recorded.  Lab Results: CBC Recent Labs    04/07/17 0550 04/08/17 0501  WBC 7.7 7.1  HGB 10.1* 10.5*  HCT 31.6* 32.8*  MCV 90.5 90.6  PLT 217 222   BMET Recent Labs    04/07/17 0550 04/08/17 0501  NA 129* 126*  K 3.9 4.1  CL 93* 89*  CO2 30 29  GLUCOSE 89 90  BUN 7 15  CREATININE 0.41* 0.40*  CALCIUM 9.0 9.3   LFTs No results for input(s): BILITOT, BILIDIR, IBILI, ALKPHOS, AST, ALT, PROT, ALBUMIN in the last 72 hours. No results for input(s): LIPASE in the last 72 hours. PT/INR No results for input(s): LABPROT, INR in the last 72 hours.    Imaging Studies: Dg Chest 2 View  Result Date: 04/05/2017 CLINICAL DATA:  Hyponatremia and weight loss EXAM: CHEST - 2 VIEW COMPARISON:  03/06/2017 FINDINGS: Cardiac shadow is stable. The lungs are hyperinflated. Mild blunting of the costophrenic angle is noted on the left likely related to small effusion. No focal infiltrate is seen. No bony abnormality is noted. IMPRESSION: Small pleural effusion on the left. No other focal abnormality is seen. Electronically Signed   By: Inez Catalina M.D.    On: 04/05/2017 19:23   US Venous Img Lower Unilateral Right  Result Date: 03/12/2017 CLINICAL DATA:  Right lower extremity edema. History of fall 1 week ago. History smoking. Evaluate for DVT. EXAM: RIGHT LOWER EXTREMITY VENOUS DOPPLER ULTRASOUND TECHNIQUE: Gray-scale sonography with graded compression, as well as color Doppler and duplex ultrasound were performed to evaluate the lower extremity deep venous systems from the level of the common femoral vein and including the common femoral, femoral, profunda femoral, popliteal and calf veins including the posterior tibial, peroneal and gastrocnemius veins when visible. The superficial great saphenous vein was also interrogated. Spectral Doppler was utilized to evaluate flow at rest and with distal augmentation maneuvers in the common femoral, femoral and popliteal veins. COMPARISON:  None. FINDINGS: Contralateral Common Femoral Vein: Respiratory phasicity is normal and symmetric with the symptomatic side. No evidence of thrombus. Normal compressibility. Common Femoral Vein: No evidence of thrombus. Normal compressibility, respiratory phasicity and response to augmentation. Saphenofemoral Junction: No evidence of thrombus. Normal compressibility and flow on color Doppler imaging. Profunda Femoral Vein: No evidence of thrombus. Normal compressibility and flow on color Doppler imaging. Femoral Vein: No evidence of thrombus. Normal compressibility, respiratory phasicity and response to augmentation. Popliteal Vein: No evidence of thrombus. Normal compressibility, respiratory phasicity and response to augmentation. Calf Veins: There is hypoechoic slightly expansile occlusive thrombus within the right peroneal vein (images 35 through 37). The right anterior and posterior tibial veins appear patent where imaged.  Superficial Great Saphenous Vein: No evidence of thrombus. Normal compressibility. Venous Reflux:  None. Other Findings:  None. IMPRESSION: Examination is  positive for occlusive DVT within the right peroneal vein. There is no extension of this short-segment distal calf DVT to the more proximal venous system of the right lower extremity. Electronically Signed   By: Sandi Mariscal M.D.   On: 03/12/2017 10:57   Dg Hip Port Unilat W Or W/o Pelvis 1v Right  Result Date: 03/13/2017 CLINICAL DATA:  Status post right intramedullary nailing of the femur. EXAM: DG HIP (WITH OR WITHOUT PELVIS) 1V PORT RIGHT COMPARISON:  Intraoperative fluoro spot views of today's date FINDINGS: The patient has undergone inter medullary nail and telescoping screw placement for an acute intertrochanteric fracture of the right femur alignment is near anatomic. The positioning of the prosthetic components is good. IMPRESSION: No immediate complication following intramedullary nail and telescoping screw placement in the proximal right femur. Electronically Signed   By: David  Martinique M.D.   On: 03/13/2017 16:51   Dg Hip Unilat With Pelvis 2-3 Views Right  Result Date: 03/28/2017 [facility] report Dictated by Dr. Aline Brochure Chief complaint 3 Pelvis AP and lateral right hip Short gamma nail fixes a intertrochanteric fracture with mild comminution of the greater trochanter Distal and proximal locking are in place and intact hardware looks good fracture is healing appropriately This is postop day 22  [2 weeks]   Assessment:  62 year old female admitted with rectal bleeding on anticoagulation, held since admission. Colonoscopy 3/23 with large rectal polyp, removed piecemeal with incomplete resection. 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 Patient has been evaluated by general surgery this admission.  It is felt that the polyp was likely too high in the rectum for patient to undergo transanal excision at our facility.  It is felt that it may be able to be excised at a facility with specialized equipment allowing for  combination of endoscopic/anoscopic instrumentation.   Hgb remains stable, clinically improved. Xarelto still on hold.    Plan: 1. Referral to [facility] general surgery as outpatient for transanal excision.  2. Consider benefits vs risks of bleeding in re-starting Xarelto. 3. Will follow peripherally.    Laureen Ochs. Bernarda Caffey [facility] [phone] 3/27/201912:25 PM     LOS: 6 days

## 2017-04-09 NOTE — Telephone Encounter (Signed)
Appointment scheduled for 04/29/17 with Dr. Morton Stall at 9:00am in the [facility] office. Location [address] [place] [address] for initial consult.

## 2017-04-09 NOTE — Telephone Encounter (Signed)
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

## 2017-04-10 LAB — BASIC METABOLIC PANEL
Anion gap: 9 (ref 5–15)
BUN: 10 mg/dL (ref 6–20)
CHLORIDE: 93 mmol/L — AB (ref 101–111)
CO2: 28 mmol/L (ref 22–32)
CREATININE: 0.37 mg/dL — AB (ref 0.44–1.00)
Calcium: 9.1 mg/dL (ref 8.9–10.3)
GFR calc Af Amer: >60 mL/min (ref >60)
GFR calc non Af Amer: >60 mL/min (ref >60)
GLUCOSE: 84 mg/dL (ref 65–99)
Potassium: 4 mmol/L (ref 3.5–5.1)
SODIUM: 130 mmol/L — AB (ref 135–145)

## 2017-04-10 LAB — CBC
HEMATOCRIT: 33.6 % — AB (ref 36.0–46.0)
Hemoglobin: 10.9 g/dL — ABNORMAL LOW (ref 12.0–15.0)
MCH: 29.9 pg (ref 26.0–34.0)
MCHC: 32.4 g/dL (ref 30.0–36.0)
MCV: 92.1 fL (ref 78.0–100.0)
PLATELETS: 215 10*3/uL (ref 150–400)
RBC: 3.65 MIL/uL — ABNORMAL LOW (ref 3.87–5.11)
RDW: 18.2 % — AB (ref 11.5–15.5)
WBC: 6.3 10*3/uL (ref 4.0–10.5)

## 2017-04-10 LAB — GLUCOSE, CAPILLARY: Glucose-Capillary: 80 mg/dL (ref 65–99)

## 2017-04-10 MED ORDER — NICOTINE 14 MG/24HR TD PT24: 14.0000 mg | MEDICATED_PATCH | Freq: Every day | TRANSDERMAL | 0 refills | Status: DC

## 2017-04-10 MED ORDER — ENSURE ENLIVE PO LIQD: 237.0000 mL | Freq: Two times a day (BID) | ORAL | 12 refills | Status: DC

## 2017-04-10 MED ORDER — BISACODYL 5 MG PO TBEC: 5.0000 mg | DELAYED_RELEASE_TABLET | Freq: Every day | ORAL | 0 refills | Status: DC | PRN

## 2017-04-10 NOTE — Care Management Note (Signed)
Case Management Note  Patient Details  Name: Cassidy Smith MRN: [id number] Date of Birth: [birth year]  If discussed at Long Length of Stay Meetings, dates discussed:  04/10/17  Additional Comments:  Sherald Barge, RN 04/10/2017, 1:48 PM

## 2017-04-10 NOTE — Clinical Social Work Note (Signed)
Patient is a Level II PASARR. Her number expired on today. Number was resubmitted. LCSW spoke with state [place] office who indicated that a decision wound not be made today about her whether patient needed a face to face or would be assigned another 30 day number.[place] office stated the earliest a decision would be made would be tomorrow morning.    Khyla Mccumbers, Clydene Pugh, LCSW

## 2017-04-10 NOTE — Discharge Summary (Signed)
Physician Discharge Summary  Cassidy Smith OVF:[id number] DOB: 10-Jul-1955 DOA: 04/03/2017  PCP: Neale Burly, MD  Admit date: 04/03/2017 Discharge date: 04/10/2017  Admitted From: SNF Disposition: SNF  Recommendations for Outpatient Follow-up:  #1 follow-up with MD at SNF in 1 week.  Please check CBC and BMET in next 2-3 days.. #2 patient needs to see [facility] general surgery for transanal excision of large tubulovillous adenoma in the rectum.  I have called surgery clinic and left a message.  Please call the clinic again if she has not been scheduled outpatient appointment in 1 week.  Home Health: None Equipment/Devices: Per PT at SNF  Discharge Condition: Fair CODE STATUS: Full code Diet recommendation: Regular with supplements    Discharge Diagnoses:  Principal Problem:   Rectal bleeding  Active Problems:   Dysplastic rectal polyp   Hyponatremia   SIADH (syndrome of inappropriate ADH production) (HCC)   History of depression   Hx of anxiety disorder   Orthostatic hypotension   Hypomagnesemia   Anticoagulated   Stool bloody   Current every day smoker   Hyperthyroidism   Pressure injury of skin   Protein-calorie malnutrition, severe  Brief narrative/HPI Please refer to admission H&P for details, in brief, 62 year old female with history of DVT on chronic anticoagulation, protein calorie malnutrition, anxiety and depression who presented with painless rectal bleed of 1 week duration associated with generalized weakness. Patient underwent colonoscopy on 3/23 showing a large rectal polyp which was partially resected.  Hospital course Lower GI bleed secondary to rectal polyp On chronic anticoagulation which was held.  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  Seen by both GI and surgery.  The polyp was felt to be too high up in the rectum for patient to undergo transanal excision  at our facility and both GI and surgery recommend it should be excised at a tertiary center with a combination of endoscopic/endoscopic instrumentation. I have called [facility] surgery clinic and left a message to schedule an outpatient appointment.  (I was unable to reach of the staff on the phone).  If patient is not called with an appointment in 1 week the surgery clinic needs to be called back again.  Xarelto was resumed on 3/27 and H&H remained stable throughout her hospital stay.  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  Doppler lower extremity negative for DVT.  She was diagnosed of right lower extremity DVT on 03/12/2017 following a right displaced hip fracture and should be on anticoagulation for at least 3 months until 06/09/2017.  Acute blood loss anemia. Stable.  Continue iron supplement.  Has not required transfusion.  Anxiety disorder and bipolar depression Continue Risperdal, Cogentin and Depakote.  Recent right hip and left radial fracture. Right hip IM nail done on 03/07/2017.  Also had sustained right nondisplaced radial styloid fracture which was managed nonsurgically.   Tobacco abuse Counseled strongly on cessation.  Hyperthyroidism Continue Tapazole  Stage I pressure injury of the skin over right hip Continue foam dressing  Hyponatremia Likely SIADH.  Improved with IV normal saline overnight.  Follow at the facility.  Severe protein calorie malnutrition Added supplement.   Consults: GI (Dr. Laural Golden), surgery  Procedure: Colonoscopy  Disposition: Return to SNF  Discharge Instructions   Allergies as of 04/10/2017   No Known Allergies     Medication List    TAKE these medications   AMINO ACIDS-PROTEIN HYDROLYS  PO Take 30 mEq by mouth 2 (two) times daily.   aspirin 81 MG EC tablet Take 1 tablet (81 mg total) by mouth daily.   benztropine 1 MG tablet Commonly known as:  COGENTIN Take 1 mg by mouth 2 (two) times  daily.   bisacodyl 5 MG EC tablet Commonly known as:  DULCOLAX Take 1 tablet (5 mg total) by mouth daily as needed for moderate constipation.   CENTRUM SILVER ADULT 50+ PO Take 1 tablet by mouth every morning.   diclofenac sodium 1 % Gel Commonly known as:  VOLTAREN Apply 2 g topically 4 (four) times daily.   divalproex 500 MG 24 hr tablet Commonly known as:  DEPAKOTE ER Take 1,500 mg by mouth at bedtime.   feeding supplement (ENSURE ENLIVE) Liqd Take 237 mLs by mouth 2 (two) times daily between meals. What changed:  Another medication with the same name was added. Make sure you understand how and when to take each.   feeding supplement (ENSURE ENLIVE) Liqd Take 237 mLs by mouth 2 (two) times daily between meals. What changed:  You were already taking a medication with the same name, and this prescription was added. Make sure you understand how and when to take each.   ferrous sulfate 325 (65 FE) MG tablet Take 1 tablet (325 mg total) by mouth daily with breakfast.   methimazole 10 MG tablet Commonly known as:  TAPAZOLE Take 10 mg by mouth daily.   nicotine 14 mg/24hr patch Commonly known as:  NICODERM CQ - dosed in mg/24 hours Place 1 patch (14 mg total) onto the skin daily. Start taking on:  04/11/2017   pantoprazole 40 MG tablet Commonly known as:  PROTONIX Take 1 tablet (40 mg total) by mouth 2 (two) times daily before a meal.   potassium chloride SA 20 MEQ tablet Commonly known as:  K-DUR,KLOR-CON Take 20 mEq by mouth daily.   RISPERDAL CONSTA 50 MG injection Generic drug:  risperiDONE microspheres Inject 50 mg into the muscle every 14 (fourteen) days.   risperidone 4 MG tablet Commonly known as:  RISPERDAL Take 4 mg by mouth at bedtime.   Rivaroxaban 15 MG Tabs tablet Commonly known as:  XARELTO Take 15 mg by mouth 2 (two) times daily with a meal. For 20 days starting 03/14/2017 at 2000.   senna-docusate 8.6-50 MG tablet Commonly known as:   Senokot-S Take 1 tablet by mouth 2 (two) times daily.   traZODone 100 MG tablet Commonly known as:  DESYREL Take 200 mg by mouth at bedtime.   vitamin B-12 1000 MCG tablet Commonly known as:  CYANOCOBALAMIN Take 1,000 mcg by mouth daily.      Follow-up Information    MD at SNF Follow up.        [facility] surgery in 2 weeks Follow up in 2 week(s).          No Known Allergies    Procedures/Studies: Dg Chest 2 View  Result Date: 04/05/2017 CLINICAL DATA:  Hyponatremia and weight loss EXAM: CHEST - 2 VIEW COMPARISON:  03/06/2017 FINDINGS: Cardiac shadow is stable. The lungs are hyperinflated. Mild blunting of the costophrenic angle is noted on the left likely related to small effusion. No focal infiltrate is seen. No bony abnormality is noted. IMPRESSION: Small pleural effusion on the left. No other focal abnormality is seen. Electronically Signed   By: Inez Catalina M.D.   On: 04/05/2017 19:23   US Venous Img Lower Unilateral Right  Result  Date: 04/09/2017 CLINICAL DATA:  Right thigh pain EXAM: RIGHT LOWER EXTREMITY VENOUS DUPLEX ULTRASOUND TECHNIQUE: Doppler venous assessment of the right lower extremity deep venous system was performed, including characterization of spectral flow, compressibility, and phasicity. COMPARISON:  None. FINDINGS: There is complete compressibility of the right common femoral, femoral, and popliteal veins. Doppler analysis demonstrates respiratory phasicity and augmentation of flow with calf compression. No obvious superficial vein or calf vein thrombosis. IMPRESSION: No evidence of right lower extremity DVT. Electronically Signed   By: Marybelle Killings M.D.   On: 04/09/2017 15:34   US Venous Img Lower Unilateral Right  Result Date: 03/12/2017 CLINICAL DATA:  Right lower extremity edema. History of fall 1 week ago. History smoking. Evaluate for DVT. EXAM: RIGHT LOWER EXTREMITY VENOUS DOPPLER ULTRASOUND TECHNIQUE: Gray-scale sonography with graded  compression, as well as color Doppler and duplex ultrasound were performed to evaluate the lower extremity deep venous systems from the level of the common femoral vein and including the common femoral, femoral, profunda femoral, popliteal and calf veins including the posterior tibial, peroneal and gastrocnemius veins when visible. The superficial great saphenous vein was also interrogated. Spectral Doppler was utilized to evaluate flow at rest and with distal augmentation maneuvers in the common femoral, femoral and popliteal veins. COMPARISON:  None. FINDINGS: Contralateral Common Femoral Vein: Respiratory phasicity is normal and symmetric with the symptomatic side. No evidence of thrombus. Normal compressibility. Common Femoral Vein: No evidence of thrombus. Normal compressibility, respiratory phasicity and response to augmentation. Saphenofemoral Junction: No evidence of thrombus. Normal compressibility and flow on color Doppler imaging. Profunda Femoral Vein: No evidence of thrombus. Normal compressibility and flow on color Doppler imaging. Femoral Vein: No evidence of thrombus. Normal compressibility, respiratory phasicity and response to augmentation. Popliteal Vein: No evidence of thrombus. Normal compressibility, respiratory phasicity and response to augmentation. Calf Veins: There is hypoechoic slightly expansile occlusive thrombus within the right peroneal vein (images 35 through 37). The right anterior and posterior tibial veins appear patent where imaged. Superficial Great Saphenous Vein: No evidence of thrombus. Normal compressibility. Venous Reflux:  None. Other Findings:  None. IMPRESSION: Examination is positive for occlusive DVT within the right peroneal vein. There is no extension of this short-segment distal calf DVT to the more proximal venous system of the right lower extremity. Electronically Signed   By: Sandi Mariscal M.D.   On: 03/12/2017 10:57   Dg Hip Port Unilat W Or W/o Pelvis 1v  Right  Result Date: 03/13/2017 CLINICAL DATA:  Status post right intramedullary nailing of the femur. EXAM: DG HIP (WITH OR WITHOUT PELVIS) 1V PORT RIGHT COMPARISON:  Intraoperative fluoro spot views of today's date FINDINGS: The patient has undergone inter medullary nail and telescoping screw placement for an acute intertrochanteric fracture of the right femur alignment is near anatomic. The positioning of the prosthetic components is good. IMPRESSION: No immediate complication following intramedullary nail and telescoping screw placement in the proximal right femur. Electronically Signed   By: David  Martinique M.D.   On: 03/13/2017 16:51   Dg Hip Unilat With Pelvis 2-3 Views Right  Result Date: 03/28/2017 [facility] report Dictated by Dr. Aline Brochure Chief complaint 3 Pelvis AP and lateral right hip Short gamma nail fixes a intertrochanteric fracture with mild comminution of the greater trochanter Distal and proximal locking are in place and intact hardware looks good fracture is healing appropriately This is postop day 22      Subjective: Denies any pain.  No further rectal bleed.  Discharge  Exam: Vitals:   04/09/17 2116 04/10/17 0702  BP: 122/62 128/70  Pulse: 69 66  Resp: 20 20  Temp: 98.1 F (36.7 C) 98.4 F (36.9 C)  SpO2: 99% 100%   Vitals:   04/09/17 0400 04/09/17 1427 04/09/17 2116 04/10/17 0702  BP: 128/69 99/60 122/62 128/70  Pulse: 67 69 69 66  Resp: 20 20 20 20   Temp: 98 F (36.7 C) 98 F (36.7 C) 98.1 F (36.7 C) 98.4 F (36.9 C)  TempSrc: Oral Oral Oral Oral  SpO2: 96% 97% 99% 100%  Weight:      Height:        General: Middle-aged female not in distress HEENT: Pallor present, moist mucosa, supple neck Chest: Clear bilaterally CVS: Normal S1-S2, no murmurs GI: Soft, nondistended, nontender, bowel sounds present Musculoskeletal: Warm, no edema     The results of significant diagnostics from this hospitalization (including imaging,  microbiology, ancillary and laboratory) are listed below for reference.     Microbiology: Recent Results (from the past 240 hour(s))  MRSA PCR Screening     Status: None   Collection Time: 04/04/17 12:03 AM  Result Value Ref Range Status   MRSA by PCR NEGATIVE NEGATIVE Final    Comment:        The GeneXpert MRSA Assay (FDA approved for NASAL specimens only), is one component of a comprehensive MRSA colonization surveillance program. It is not intended to diagnose MRSA infection nor to guide or monitor treatment for MRSA infections. Performed at [facility], [address], [place], [place] [address]      Labs: BNP (last 3 results) No results for input(s): BNP in the last 8760 hours. Basic Metabolic Panel: Recent Labs  Lab 04/04/17 0456 04/05/17 0738 04/06/17 0718 04/07/17 0550 04/08/17 0501 04/10/17 0640  NA 129* 124* 126* 129* 126* 130*  K 4.3 4.1 4.0 3.9 4.1 4.0  CL 93* 90* 90* 93* 89* 93*  CO2 30 26 27 30 29 28   GLUCOSE 82 104* 85 89 90 84  BUN 9 10 7 7 15 10   CREATININE 0.42* 0.43* 0.35* 0.41* 0.40* 0.37*  CALCIUM 9.3 9.4 8.6* 9.0 9.3 9.1  MG 1.6*  --   --   --   --   --    Liver Function Tests: No results for input(s): AST, ALT, ALKPHOS, BILITOT, PROT, ALBUMIN in the last 168 hours. No results for input(s): LIPASE, AMYLASE in the last 168 hours. No results for input(s): AMMONIA in the last 168 hours. CBC: Recent Labs  Lab 04/05/17 0738 04/06/17 0718 04/07/17 0550 04/08/17 0501 04/10/17 0640  WBC 8.9 9.2 7.7 7.1 6.3  HGB 11.0* 10.7* 10.1* 10.5* 10.9*  HCT 34.5* 32.9* 31.6* 32.8* 33.6*  MCV 91.5 90.1 90.5 90.6 92.1  PLT 264 251 217 222 215   Cardiac Enzymes: No results for input(s): CKTOTAL, CKMB, CKMBINDEX, TROPONINI in the last 168 hours. BNP: Invalid input(s): POCBNP CBG: Recent Labs  Lab 04/07/17 2118 04/08/17 0821 04/08/17 1057 04/09/17 0736 04/10/17 0800  GLUCAP 130* 149* 114* 89 80   D-Dimer No results for input(s): DDIMER in the  last 72 hours. Hgb A1c No results for input(s): HGBA1C in the last 72 hours. Lipid Profile No results for input(s): CHOL, HDL, LDLCALC, TRIG, CHOLHDL, LDLDIRECT in the last 72 hours. Thyroid function studies No results for input(s): TSH, T4TOTAL, T3FREE, THYROIDAB in the last 72 hours.  Invalid input(s): FREET3 Anemia work up No results for input(s): VITAMINB12, FOLATE, FERRITIN, TIBC, IRON, RETICCTPCT in  the last 72 hours. Urinalysis    Component Value Date/Time   COLORURINE YELLOW 03/06/2017 1700   APPEARANCEUR CLEAR 03/06/2017 1700   LABSPEC 1.021 03/06/2017 1700   PHURINE 5.0 03/06/2017 1700   GLUCOSEU NEGATIVE 03/06/2017 1700   HGBUR NEGATIVE 03/06/2017 1700   BILIRUBINUR NEGATIVE 03/06/2017 1700   KETONESUR 5 (A) 03/06/2017 1700   PROTEINUR NEGATIVE 03/06/2017 1700   UROBILINOGEN 0.2 05/12/2013 0210   NITRITE NEGATIVE 03/06/2017 1700   LEUKOCYTESUR TRACE (A) 03/06/2017 1700   Sepsis Labs Invalid input(s): PROCALCITONIN,  WBC,  LACTICIDVEN Microbiology Recent Results (from the past 240 hour(s))  MRSA PCR Screening     Status: None   Collection Time: 04/04/17 12:03 AM  Result Value Ref Range Status   MRSA by PCR NEGATIVE NEGATIVE Final    Comment:        The GeneXpert MRSA Assay (FDA approved for NASAL specimens only), is one component of a comprehensive MRSA colonization surveillance program. It is not intended to diagnose MRSA infection nor to guide or monitor treatment for MRSA infections. Performed at [facility], [address], [place], [place] [address]      Time coordinating discharge: Over 30 minutes  SIGNED:   Louellen Molder, MD  [facility] 04/10/2017, 3:04 PM Pager   If 7PM-7AM, please contact night-coverage [email or web address] Password [email or web address]

## 2017-04-11 LAB — OSMOLALITY: OSMOLALITY: 258 mosm/kg — AB (ref 275–295)

## 2017-04-11 LAB — BASIC METABOLIC PANEL
Anion gap: 11 (ref 5–15)
BUN: 9 mg/dL (ref 6–20)
CALCIUM: 9.2 mg/dL (ref 8.9–10.3)
CO2: 26 mmol/L (ref 22–32)
Chloride: 84 mmol/L — ABNORMAL LOW (ref 101–111)
Glucose, Bld: 96 mg/dL (ref 65–99)
Potassium: 4.4 mmol/L (ref 3.5–5.1)
SODIUM: 121 mmol/L — AB (ref 135–145)

## 2017-04-11 LAB — HEMOGLOBIN AND HEMATOCRIT, BLOOD
HEMATOCRIT: 33.2 % — AB (ref 36.0–46.0)
Hemoglobin: 11 g/dL — ABNORMAL LOW (ref 12.0–15.0)

## 2017-04-11 LAB — URIC ACID: Uric Acid, Serum: 2.4 mg/dL (ref 2.3–6.6)

## 2017-04-11 LAB — GLUCOSE, CAPILLARY: GLUCOSE-CAPILLARY: 85 mg/dL (ref 65–99)

## 2017-04-11 MED ORDER — FUROSEMIDE 20 MG PO TABS
20.0000 mg | ORAL_TABLET | Freq: Every day | ORAL | Status: DC
  Administered 2017-04-11 – 2017-04-17 (×7): 20 mg via ORAL
  Filled 2017-04-11 (×7): qty 1

## 2017-04-11 NOTE — Progress Notes (Signed)
PROGRESS NOTE                                                                                                                                                                                                             Patient Demographics:    Cassidy Smith, is a 62 y.o. female, DOB - 1955/02/18, UTM:[id number]  Admit date - 04/03/2017   Admitting Physician Murlean Iba, MD  Outpatient Primary MD for the patient is Neale Burly, MD  LOS - 8  Outpatient Specialists: None  Chief Complaint  Patient presents with  . Rectal Bleeding       Brief Narrative   62 year old female with history of DVT on chronic anticoagulation, protein calorie malnutrition, anxiety and depression who presented with painless rectal bleed of 1 week duration associated with generalized weakness. Patient underwent colonoscopy on 3/23 showing a large rectal polyp which was partially resected.     Subjective:   Denies any rectal bleed.  No overnight events.   Assessment  & Plan :   Lower GI bleed secondary to rectal polyp On chronic anticoagulation which was held.  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  Seen by both GI and surgery.  The polyp was felt to be too high up in the rectum for patient to undergo transanal excision at our facility and both GI and surgery recommend it should be excised at a tertiary center with a combination of endoscopic/endoscopic instrumentation. I have called [facility] surgery clinic and left a message to schedule an outpatient appointment.  (I was unable to reach of the staff on the phone).  If patient is not called with an appointment in 1 week the surgery clinic needs to be called back again.  Xarelto was resumed on 3/27 and H&H stable.  Active problems Hyponatremia SIADH versus dehydration.  Had improved with IV  normal saline but again became hyponatremic (sodium of 121 today).  Normal urine sodium and osmolality.  Continue fluid restriction.  Nephrology consulted.   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  Doppler lower extremity negative for DVT.  She was diagnosed of right lower extremity DVT on 03/12/2017 following a right displaced hip fracture and should be on anticoagulation for at least 3 months until 06/09/2017.  Acute blood loss anemia. Stable.  Continue iron supplement.  Has not required transfusion.  Anxiety disorder and bipolar depression Continue Risperdal, Cogentin and Depakote.  (All these do not commonly cause hyponatremia)  Recent right hip and left radial fracture. Right hip IM nail done on 03/07/2017.  Also had sustained right nondisplaced radial styloid fracture which was managed nonsurgically.   Tobacco abuse Counseled strongly on cessation.  Hyperthyroidism Continue Tapazole  Stage I pressure injury of the skin over right hip Continue foam dressing   Severe protein calorie malnutrition Added supplement.        Code Status : Full code  Family Communication  : None at bedside  Disposition Plan  : SNF possibly early next week  Barriers For Discharge : Hyponatremia, awaiting passer number.  Consults  : Nephrology, GI  Procedures  : Colonoscopy  DVT Prophylaxis  : Xarelto  Lab Results  Component Value Date   PLT 215 04/10/2017    Antibiotics  :    Anti-infectives (From admission, onward)   None        Objective:   Vitals:   04/09/17 2116 04/10/17 0702 04/10/17 2018 04/11/17 0503  BP: 122/62 128/70 126/67 134/63  Pulse: 69 66 63 65  Resp: 20 20    Temp: 98.1 F (36.7 C) 98.4 F (36.9 C) 98.3 F (36.8 C) 98.1 F (36.7 C)  TempSrc: Oral Oral Oral Oral  SpO2: 99% 100% 98% 98%  Weight:    51.3 kg (113 lb 1.5 oz)  Height:        Wt Readings from Last 3 Encounters:  04/11/17 51.3 kg (113 lb 1.5 oz)    03/14/17 63.8 kg (140 lb 10.5 oz)  01/26/16 84.4 kg (186 lb)     Intake/Output Summary (Last 24 hours) at 04/11/2017 1215 Last data filed at 04/11/2017 0936 Gross per 24 hour  Intake 1315 ml  Output 1500 ml  Net -185 ml     Physical Exam  Gen: not in distress HEENT: Pallor present, moist mucosa, supple neck Chest: clear b/l, no added sounds CVS: N S1&S2, no murmurs, GI: soft, NT, ND, BS+ Musculoskeletal: warm, no edema     Data Review:    CBC Recent Labs  Lab 04/05/17 0738 04/06/17 0718 04/07/17 0550 04/08/17 0501 04/10/17 0640 04/11/17 0859  WBC 8.9 9.2 7.7 7.1 6.3  --   HGB 11.0* 10.7* 10.1* 10.5* 10.9* 11.0*  HCT 34.5* 32.9* 31.6* 32.8* 33.6* 33.2*  PLT 264 251 217 222 215  --   MCV 91.5 90.1 90.5 90.6 92.1  --   MCH 29.2 29.3 28.9 29.0 29.9  --   MCHC 31.9 32.5 32.0 32.0 32.4  --   RDW 19.1* 18.5* 18.7* 18.6* 18.2*  --     Chemistries  Recent Labs  Lab 04/06/17 0718 04/07/17 0550 04/08/17 0501 04/10/17 0640 04/11/17 0859  NA 126* 129* 126* 130* 121*  K 4.0 3.9 4.1 4.0 4.4  CL 90* 93* 89* 93* 84*  CO2 27 30 29 28 26   GLUCOSE 85 89 90 84 96  BUN 7 7 15 10 9   CREATININE 0.35* 0.41* 0.40* 0.37* <0.30*  CALCIUM 8.6* 9.0 9.3 9.1 9.2   ------------------------------------------------------------------------------------------------------------------ No results for input(s): CHOL, HDL, LDLCALC, TRIG, CHOLHDL, LDLDIRECT in the last 72 hours.  Lab Results  Component Value Date   HGBA1C 5.4 05/13/2013   ------------------------------------------------------------------------------------------------------------------ No results for input(s): TSH, T4TOTAL, T3FREE, THYROIDAB in the last 72 hours.  Invalid input(s): FREET3 ------------------------------------------------------------------------------------------------------------------ No results  for input(s): VITAMINB12, FOLATE, FERRITIN, TIBC, IRON, RETICCTPCT in the last 72 hours.  Coagulation  profile No results for input(s): INR, PROTIME in the last 168 hours.  No results for input(s): DDIMER in the last 72 hours.  Cardiac Enzymes No results for input(s): CKMB, TROPONINI, MYOGLOBIN in the last 168 hours.  Invalid input(s): CK ------------------------------------------------------------------------------------------------------------------ No results found for: BNP  Inpatient Medications  Scheduled Meds: . benztropine  1 mg Oral BID  . divalproex  1,500 mg Oral QHS  . feeding supplement (ENSURE ENLIVE)  237 mL Oral BID BM  . ferrous sulfate  325 mg Oral Q breakfast  . magnesium oxide  400 mg Oral BID  . methimazole  10 mg Oral Daily  . nicotine  14 mg Transdermal Daily  . pantoprazole  40 mg Oral BID AC  . risperiDONE  4 mg Oral QHS  . rivaroxaban  20 mg Oral Q supper  . senna-docusate  1 tablet Oral BID  . sodium chloride flush  3 mL Intravenous Q12H  . traZODone  200 mg Oral QHS  . vitamin B-12  1,000 mcg Oral Daily   Continuous Infusions: . sodium chloride     PRN Meds:.sodium chloride, acetaminophen **OR** acetaminophen, bisacodyl, ondansetron **OR** ondansetron (ZOFRAN) IV, sodium chloride flush  Micro Results Recent Results (from the past 240 hour(s))  MRSA PCR Screening     Status: None   Collection Time: 04/04/17 12:03 AM  Result Value Ref Range Status   MRSA by PCR NEGATIVE NEGATIVE Final    Comment:        The GeneXpert MRSA Assay (FDA approved for NASAL specimens only), is one component of a comprehensive MRSA colonization surveillance program. It is not intended to diagnose MRSA infection nor to guide or monitor treatment for MRSA infections. Performed at [facility], [address], [place], [place] [address]     Radiology Reports Dg Chest 2 View  Result Date: 04/05/2017 CLINICAL DATA:  Hyponatremia and weight loss EXAM: CHEST - 2 VIEW COMPARISON:  03/06/2017 FINDINGS: Cardiac shadow is stable. The lungs are hyperinflated. Mild  blunting of the costophrenic angle is noted on the left likely related to small effusion. No focal infiltrate is seen. No bony abnormality is noted. IMPRESSION: Small pleural effusion on the left. No other focal abnormality is seen. Electronically Signed   By: Inez Catalina M.D.   On: 04/05/2017 19:23   US Venous Img Lower Unilateral Right  Result Date: 04/09/2017 CLINICAL DATA:  Right thigh pain EXAM: RIGHT LOWER EXTREMITY VENOUS DUPLEX ULTRASOUND TECHNIQUE: Doppler venous assessment of the right lower extremity deep venous system was performed, including characterization of spectral flow, compressibility, and phasicity. COMPARISON:  None. FINDINGS: There is complete compressibility of the right common femoral, femoral, and popliteal veins. Doppler analysis demonstrates respiratory phasicity and augmentation of flow with calf compression. No obvious superficial vein or calf vein thrombosis. IMPRESSION: No evidence of right lower extremity DVT. Electronically Signed   By: Marybelle Killings M.D.   On: 04/09/2017 15:34   Dg Hip Port Unilat W Or W/o Pelvis 1v Right  Result Date: 03/13/2017 CLINICAL DATA:  Status post right intramedullary nailing of the femur. EXAM: DG HIP (WITH OR WITHOUT PELVIS) 1V PORT RIGHT COMPARISON:  Intraoperative fluoro spot views of today's date FINDINGS: The patient has undergone inter medullary nail and telescoping screw placement for an acute intertrochanteric fracture of the right femur alignment is near anatomic. The positioning of the prosthetic components is good. IMPRESSION: No immediate complication  following intramedullary nail and telescoping screw placement in the proximal right femur. Electronically Signed   By: David  Martinique M.D.   On: 03/13/2017 16:51   Dg Hip Unilat With Pelvis 2-3 Views Right  Result Date: 03/28/2017 [facility] report Dictated by Dr. Aline Brochure Chief complaint 3 Pelvis AP and lateral right hip Short gamma nail fixes a  intertrochanteric fracture with mild comminution of the greater trochanter Distal and proximal locking are in place and intact hardware looks good fracture is healing appropriately This is postop day 22    Time Spent in minutes  25   Unnamed Hino M.D on 04/11/2017 at 12:15 PM  Between 7am to 7pm - Pager - [phone]  After 7pm go to [email or web address] - password [facility]  [facility] -  Office  [phone]

## 2017-04-11 NOTE — Clinical Social Work Note (Addendum)
[facility] documentation indicating pt has been assigned for a face to face screening. Therefore, pt will not be able to dc back to [facility] until she has been assessed and assigned a new [facility] number. Anticipating that the earliest she will be able to dc is Monday 4/1. Updated MD in progression today. Updated Lynnae Sandhoff at [facility] this AM.   CSW will follow.

## 2017-04-11 NOTE — Consult Note (Signed)
Reason for Consult: Hyponatremia Referring Physician: Dr. Barnabas Harries is an 62 y.o. female.  HPI: She is a patient who has history of depression, hyperthyroidism, CVA and SIADH presently came with complaints of GI bleeding.  When she was evaluated she was found to have tubular and tubular villous adenoma which was considered to be the source of bleeding.  Presently consult is called because of hyponatremia.  Patient at this moment denies any nausea, vomiting.  Her appetite is good.  She denies also any difficulty breathing.  Past Medical History:  Diagnosis Date  . Anxiety   . Bipolar 1 disorder ([place])   . Chronic hip pain   . Chronic kidney disease    uti  currently,  hx bladder spasms  . Depression   . Headache(784.0)   . Hyperthyroidism   . Neuromuscular disorder ([facility])    shaking of hands   . SIADH (syndrome of inappropriate ADH production) ([place]) 04/08/2017  . Stroke [facility])    mini    Past Surgical History:  Procedure Laterality Date  . COLONOSCOPY WITH PROPOFOL N/A 04/05/2017   Procedure: COLONOSCOPY WITH PROPOFOL;  Surgeon: Rogene Houston, MD;  Location[facility];  Service: Endoscopy;  Laterality: N/A;  . INTRAMEDULLARY (IM) NAIL INTERTROCHANTERIC Right 03/07/2017   Procedure: OPEN TREATMENT INTERNAL FIXATION RIGHT HIP WITH GAMA INTRAMEDULARY NAIL;  Surgeon: Carole Civil, MD;  Location: [facility] ORS;  Service: Orthopedics;  Laterality: Right;  . MULTIPLE EXTRACTIONS WITH ALVEOLOPLASTY N/A 10/30/2012   Procedure: MULTIPLE EXTRACION 5, 6, 8, 9, 10 ,18, 19, 31 WITH MAXILLARY RIGHT AND LEFT  ALVEOLOPLASTY REDUCE MAXILLARY LEFT TUBEROSITY;  Surgeon: Gae Bon, DDS;  Location: [place];  Service: Oral Surgery;  Laterality: N/A;  . POLYPECTOMY  04/05/2017   Procedure: POLYPECTOMY;  Surgeon: Rogene Houston, MD;  Location[facility];  Service: Endoscopy;;  recto-sigmoid, rectum    Family History  Problem Relation Age of Onset  . Breast cancer Mother   . Cancer  - Other Mother   . Alcoholism Father   . Colon cancer Neg Hx   . Colon polyps Neg Hx     Social History:  reports that she has been smoking cigarettes.  She has a 10.00 pack-year smoking history. She has never used smokeless tobacco. She reports that she does not drink alcohol or use drugs.  Allergies: No Known Allergies  Medications: I have reviewed the patient's current medications.  Results for orders placed or performed during the hospital encounter of 04/03/17 (from the past 48 hour(s))  Basic metabolic panel     Status: Abnormal   Collection Time: 04/10/17  6:40 AM  Result Value Ref Range   Sodium 130 (L) 135 - 145 mmol/L   Potassium 4.0 3.5 - 5.1 mmol/L   Chloride 93 (L) 101 - 111 mmol/L   CO2 28 22 - 32 mmol/L   Glucose, Bld 84 65 - 99 mg/dL   BUN 10 6 - 20 mg/dL   Creatinine, Ser 0.37 (L) 0.44 - 1.00 mg/dL   Calcium 9.1 8.9 - 10.3 mg/dL   GFR calc non Af Amer >60 >60 mL/min   GFR calc Af Amer >60 >60 mL/min    Comment: (NOTE) The eGFR has been calculated using the CKD EPI equation. This calculation has not been validated in all clinical situations. eGFR's persistently <60 mL/min signify possible Chronic Kidney Disease.    Anion gap 9 5 - 15    Comment: Performed at [facility], [address], [place], [place] [address]  CBC     Status: Abnormal   Collection Time: 04/10/17  6:40 AM  Result Value Ref Range   WBC 6.3 4.0 - 10.5 K/uL   RBC 3.65 (L) 3.87 - 5.11 MIL/uL   Hemoglobin 10.9 (L) 12.0 - 15.0 g/dL   HCT 33.6 (L) 36.0 - 46.0 %   MCV 92.1 78.0 - 100.0 fL   MCH 29.9 26.0 - 34.0 pg   MCHC 32.4 30.0 - 36.0 g/dL   RDW 18.2 (H) 11.5 - 15.5 %   Platelets 215 150 - 400 K/uL    Comment: Performed at [facility], [address], [place], [place] [address]  Glucose, capillary     Status: None   Collection Time: 04/10/17  8:00 AM  Result Value Ref Range   Glucose-Capillary 80 65 - 99 mg/dL  Glucose, capillary     Status: None   Collection Time: 04/11/17  7:12 AM   Result Value Ref Range   Glucose-Capillary 85 65 - 99 mg/dL  Hemoglobin and hematocrit, blood     Status: Abnormal   Collection Time: 04/11/17  8:59 AM  Result Value Ref Range   Hemoglobin 11.0 (L) 12.0 - 15.0 g/dL   HCT 33.2 (L) 36.0 - 46.0 %    Comment: Performed at [facility], [address], [place], [place] [address]  Basic metabolic panel     Status: Abnormal   Collection Time: 04/11/17  8:59 AM  Result Value Ref Range   Sodium 121 (L) 135 - 145 mmol/L    Comment: DELTA CHECK NOTED   Potassium 4.4 3.5 - 5.1 mmol/L   Chloride 84 (L) 101 - 111 mmol/L   CO2 26 22 - 32 mmol/L   Glucose, Bld 96 65 - 99 mg/dL   BUN 9 6 - 20 mg/dL   Creatinine, Ser <0.30 (L) 0.44 - 1.00 mg/dL   Calcium 9.2 8.9 - 10.3 mg/dL   GFR calc non Af Amer NOT CALCULATED >60 mL/min   GFR calc Af Amer NOT CALCULATED >60 mL/min    Comment: (NOTE) The eGFR has been calculated using the CKD EPI equation. This calculation has not been validated in all clinical situations. eGFR's persistently <60 mL/min signify possible Chronic Kidney Disease.    Anion gap 11 5 - 15    Comment: Performed at [facility], [address], [place],  [address]    US Venous Img Lower Unilateral Right  Result Date: 04/09/2017 CLINICAL DATA:  Right thigh pain EXAM: RIGHT LOWER EXTREMITY VENOUS DUPLEX ULTRASOUND TECHNIQUE: Doppler venous assessment of the right lower extremity deep venous system was performed, including characterization of spectral flow, compressibility, and phasicity. COMPARISON:  None. FINDINGS: There is complete compressibility of the right common femoral, femoral, and popliteal veins. Doppler analysis demonstrates respiratory phasicity and augmentation of flow with calf compression. No obvious superficial vein or calf vein thrombosis. IMPRESSION: No evidence of right lower extremity DVT. Electronically Signed   By: Marybelle Killings M.D.   On: 04/09/2017 15:34    Review of Systems  Constitutional: Negative for  chills, fever and malaise/fatigue.  Respiratory: Negative for shortness of breath.   Cardiovascular: Negative for chest pain and leg swelling.  Gastrointestinal: Negative for abdominal pain, nausea and vomiting.   Blood pressure 134/63, pulse 65, temperature 98.1 F (36.7 C), temperature source Oral, resp. rate 20, height 5' 6"  (1.676 m), weight 51.3 kg (113 lb 1.5 oz), SpO2 98 %. Physical Exam  Constitutional: She is oriented to person, place, and time. No distress.  Eyes: No scleral icterus.  Neck: No JVD present.  Cardiovascular: Normal rate and regular rhythm.  No murmur heard. Respiratory: No respiratory distress.  GI: She exhibits no distension. There is no tenderness.  Musculoskeletal: She exhibits no edema.  Neurological: She is alert and oriented to person, place, and time.    Assessment/Plan: 1] hyponatremia: Patient has recurrent hyponatremia for the last couple of years.  Her average sodium seems to be ranging between 129 to 130.  During this admission her sodium has been ranging between 124 to 130.  Yesterday her sodium reached a peak of 130 and this morning has declined to 121.  As there is no change in her medication.  Recent workup when her sodium was 129 her urine osmolality was 356 the lowest and 568 the highest.  Her urine sodium is 61.  Assuming this is a true hyponatremia since there is no serum osmolality this fluctuation of her sodium without any change in the medication or patient medical problems possibly indicate an increase intake and hypotonic fluid.  Presently patient is a symptomatic. 2] history of anxiety disorder/depression 3] hyperthyroidism 4] patient with GI bleeding secondary to bladder and Tubuovillous adenoma Plan: We will check her serum osmolality, urine osmolality, urine sodium today 2] will decrease her free water intake 3] we will liberalize her salt intake 4] we will start her on Lasix 20 mg p.o. once a day 5] we will check her renal panel in  the morning.  Isabel Ardila S 04/11/2017, 12:49 PM

## 2017-04-12 LAB — CBC
HCT: 34.1 % — ABNORMAL LOW (ref 36.0–46.0)
HEMOGLOBIN: 11.2 g/dL — AB (ref 12.0–15.0)
MCH: 29.5 pg (ref 26.0–34.0)
MCHC: 32.8 g/dL (ref 30.0–36.0)
MCV: 89.7 fL (ref 78.0–100.0)
PLATELETS: 210 10*3/uL (ref 150–400)
RBC: 3.8 MIL/uL — ABNORMAL LOW (ref 3.87–5.11)
RDW: 17.2 % — AB (ref 11.5–15.5)
WBC: 5.9 10*3/uL (ref 4.0–10.5)

## 2017-04-12 LAB — GLUCOSE, CAPILLARY: GLUCOSE-CAPILLARY: 108 mg/dL — AB (ref 65–99)

## 2017-04-12 LAB — RENAL FUNCTION PANEL
ALBUMIN: 2.5 g/dL — AB (ref 3.5–5.0)
Anion gap: 10 (ref 5–15)
BUN: 11 mg/dL (ref 6–20)
CALCIUM: 9.1 mg/dL (ref 8.9–10.3)
CO2: 28 mmol/L (ref 22–32)
CREATININE: 0.37 mg/dL — AB (ref 0.44–1.00)
Chloride: 88 mmol/L — ABNORMAL LOW (ref 101–111)
GFR calc Af Amer: >60 mL/min (ref >60)
GFR calc non Af Amer: >60 mL/min (ref >60)
GLUCOSE: 101 mg/dL — AB (ref 65–99)
PHOSPHORUS: 2.8 mg/dL (ref 2.5–4.6)
Potassium: 3.7 mmol/L (ref 3.5–5.1)
SODIUM: 126 mmol/L — AB (ref 135–145)

## 2017-04-12 NOTE — Progress Notes (Signed)
[place] for xarelto Indication: history of DVT (03/13/17)  No Known Allergies  Patient Measurements: Height: 5\' 6"  (167.6 cm) Weight: 113 lb 1.5 oz (51.3 kg) IBW/kg (Calculated) : 59.3   Vital Signs: Temp: 97.5 F (36.4 C) (03/30 0500) BP: 106/56 (03/30 0500) Pulse Rate: 65 (03/30 0500)  Labs: Recent Labs    04/10/17 0640 04/11/17 0859 04/12/17 0706  HGB 10.9* 11.0* 11.2*  HCT 33.6* 33.2* 34.1*  PLT 215  --  210  CREATININE 0.37* <0.30* 0.37*   Estimated Creatinine Clearance: 58.3 mL/min (A) (by C-G formula based on SCr of 0.37 mg/dL (L)).  Assessment: Pharmacy has be consulted to dose Xarelto in patient with history of DVT. Started on Xarelto treatment dose on 03/13/17. Patient admitted on 3/21 for evaluation of rectal bleeding.  Goal of Therapy:  Systemic Anticoagulation. Monitor platelets by anticoagulation protocol: Yes   Plan:  Continue Xarelto 20 mg daily with supper Monitor labs and s/s of bleeding Sign off protocol, monitor per normal [facility] pharmacy routine.  Pricilla Larsson 04/12/2017,2:21 PM

## 2017-04-12 NOTE — Progress Notes (Signed)
PROGRESS NOTE                                                                                                                                                                                                             Patient Demographics:    Cassidy Smith, is a 62 y.o. female, DOB - 01/21/1955, UUV:[id number]  Admit date - 04/03/2017   Admitting Physician Murlean Iba, MD  Outpatient Primary MD for the patient is Neale Burly, MD  LOS - 9  Outpatient Specialists: None  Chief Complaint  Patient presents with  . Rectal Bleeding       Brief Narrative   62 year old female with history of DVT on chronic anticoagulation, protein calorie malnutrition, anxiety and depression who presented with painless rectal bleed of 1 week duration associated with generalized weakness. Patient underwent colonoscopy on 3/23 showing a large rectal polyp which was partially resected. Hospital course prolonged with worsened hyponatremia.    Subjective:   No further rectal bleeding.  Wants to get physical therapy.   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  Both surgery and GI recommend that the adenoma was too high up in the rectum and unable to perform transanal excision at our facility.  Recommend evaluation by surgery at [facility] where it can be excised with a combination of endoscopic/anoscopic instrumentation.  I have called [facility] surgery clinic and left a message to schedule an outpatient appointment.  (I was unable to reach of the staff on the phone).  If patient is not called with an appointment in 1 week the surgery clinic needs to be called back again.  Xarelto  resumed on 3/27.  H&H remained stable.   Active  problems Hyponatremia SIADH versus hypovolemic hyponatremia.  Patient has had chronic/ recurrent hyponatremia with sodium level between 128-130.  Worsened to 121 on 3/29.  Normal urine sodium and osmolality.  She remains asymptomatic. Renal consult appreciated and started on Lasix 20 mg daily and recommended liberal salt intake. Sodium improving in a.m. lab.   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  Doppler lower extremity negative for DVT.  She was diagnosed of right lower extremity  DVT on 03/12/2017 following a right displaced hip fracture and should be on anticoagulation for at least 3 months until 06/09/2017.  Acute blood loss anemia. Stable.  Continue iron supplement.  Has not required transfusion.  Anxiety disorder and bipolar depression Continue Risperdal, Cogentin and Depakote.  (All these do not commonly cause hyponatremia)  Recent right hip and left radial fracture. Right hip IM nail done on 03/07/2017.  Also had sustained right nondisplaced radial styloid fracture which was managed nonsurgically.  PT evaluation.   Tobacco abuse Counseled strongly on cessation.  Hyperthyroidism Continue Tapazole  Stage I pressure injury of the skin over right hip Continue foam dressing   Severe protein calorie malnutrition Added supplement.        Code Status : Full code  Family Communication  : None at bedside  Disposition Plan  : SNF possibly early next week  Barriers For Discharge : Hyponatremia, awaiting passer number.  Consults  : Nephrology, GI  Procedures  : Colonoscopy  DVT Prophylaxis  : Xarelto  Lab Results  Component Value Date   PLT 210 04/12/2017    Antibiotics  :    Anti-infectives (From admission, onward)   None        Objective:   Vitals:   04/11/17 0503 04/11/17 1500 04/11/17 2300 04/12/17 0500  BP: 134/63 (!) 97/50 (!) 121/59 (!) 106/56  Pulse: 65 65 66 65  Resp:  18 16 17   Temp: 98.1 F (36.7 C)  97.7 F (36.5 C) 97.7 F (36.5 C) (!) 97.5 F (36.4 C)  TempSrc: Oral Oral    SpO2: 98% 99% 97% 99%  Weight: 51.3 kg (113 lb 1.5 oz)     Height:        Wt Readings from Last 3 Encounters:  04/11/17 51.3 kg (113 lb 1.5 oz)  03/14/17 63.8 kg (140 lb 10.5 oz)  01/26/16 84.4 kg (186 lb)     Intake/Output Summary (Last 24 hours) at 04/12/2017 1144 Last data filed at 04/12/2017 0300 Gross per 24 hour  Intake 750 ml  Output 400 ml  Net 350 ml     Physical Exam General: Middle-aged female not in distress HEENT: Moist mucosa, supple neck Chest: Clear bilaterally CVS: Normal S1 and S2, no murmurs GI: Soft, nondistended, nontender Musculoskeletal: Warm, no edema     Data Review:    CBC Recent Labs  Lab 04/06/17 0718 04/07/17 0550 04/08/17 0501 04/10/17 0640 04/11/17 0859 04/12/17 0706  WBC 9.2 7.7 7.1 6.3  --  5.9  HGB 10.7* 10.1* 10.5* 10.9* 11.0* 11.2*  HCT 32.9* 31.6* 32.8* 33.6* 33.2* 34.1*  PLT 251 217 222 215  --  210  MCV 90.1 90.5 90.6 92.1  --  89.7  MCH 29.3 28.9 29.0 29.9  --  29.5  MCHC 32.5 32.0 32.0 32.4  --  32.8  RDW 18.5* 18.7* 18.6* 18.2*  --  17.2*    Chemistries  Recent Labs  Lab 04/07/17 0550 04/08/17 0501 04/10/17 0640 04/11/17 0859 04/12/17 0706  NA 129* 126* 130* 121* 126*  K 3.9 4.1 4.0 4.4 3.7  CL 93* 89* 93* 84* 88*  CO2 30 29 28 26 28   GLUCOSE 89 90 84 96 101*  BUN 7 15 10 9 11   CREATININE 0.41* 0.40* 0.37* <0.30* 0.37*  CALCIUM 9.0 9.3 9.1 9.2 9.1   ------------------------------------------------------------------------------------------------------------------ No results for input(s): CHOL, HDL, LDLCALC, TRIG, CHOLHDL, LDLDIRECT in the last 72 hours.  Lab Results  Component Value Date   HGBA1C  5.4 05/13/2013   ------------------------------------------------------------------------------------------------------------------ No results for input(s): TSH, T4TOTAL, T3FREE, THYROIDAB in the last 72 hours.  Invalid  input(s): FREET3 ------------------------------------------------------------------------------------------------------------------ No results for input(s): VITAMINB12, FOLATE, FERRITIN, TIBC, IRON, RETICCTPCT in the last 72 hours.  Coagulation profile No results for input(s): INR, PROTIME in the last 168 hours.  No results for input(s): DDIMER in the last 72 hours.  Cardiac Enzymes No results for input(s): CKMB, TROPONINI, MYOGLOBIN in the last 168 hours.  Invalid input(s): CK ------------------------------------------------------------------------------------------------------------------ No results found for: BNP  Inpatient Medications  Scheduled Meds: . benztropine  1 mg Oral BID  . divalproex  1,500 mg Oral QHS  . feeding supplement (ENSURE ENLIVE)  237 mL Oral BID BM  . ferrous sulfate  325 mg Oral Q breakfast  . furosemide  20 mg Oral Daily  . magnesium oxide  400 mg Oral BID  . methimazole  10 mg Oral Daily  . nicotine  14 mg Transdermal Daily  . pantoprazole  40 mg Oral BID AC  . risperiDONE  4 mg Oral QHS  . rivaroxaban  20 mg Oral Q supper  . senna-docusate  1 tablet Oral BID  . sodium chloride flush  3 mL Intravenous Q12H  . traZODone  200 mg Oral QHS  . vitamin B-12  1,000 mcg Oral Daily   Continuous Infusions: . sodium chloride     PRN Meds:.sodium chloride, acetaminophen **OR** acetaminophen, bisacodyl, ondansetron **OR** ondansetron (ZOFRAN) IV, sodium chloride flush  Micro Results Recent Results (from the past 240 hour(s))  MRSA PCR Screening     Status: None   Collection Time: 04/04/17 12:03 AM  Result Value Ref Range Status   MRSA by PCR NEGATIVE NEGATIVE Final    Comment:        The GeneXpert MRSA Assay (FDA approved for NASAL specimens only), is one component of a comprehensive MRSA colonization surveillance program. It is not intended to diagnose MRSA infection nor to guide or monitor treatment for MRSA infections. Performed at [facility], [address], [place], [place] [address]     Radiology Reports Dg Chest 2 View  Result Date: 04/05/2017 CLINICAL DATA:  Hyponatremia and weight loss EXAM: CHEST - 2 VIEW COMPARISON:  03/06/2017 FINDINGS: Cardiac shadow is stable. The lungs are hyperinflated. Mild blunting of the costophrenic angle is noted on the left likely related to small effusion. No focal infiltrate is seen. No bony abnormality is noted. IMPRESSION: Small pleural effusion on the left. No other focal abnormality is seen. Electronically Signed   By: Inez Catalina M.D.   On: 04/05/2017 19:23   US Venous Img Lower Unilateral Right  Result Date: 04/09/2017 CLINICAL DATA:  Right thigh pain EXAM: RIGHT LOWER EXTREMITY VENOUS DUPLEX ULTRASOUND TECHNIQUE: Doppler venous assessment of the right lower extremity deep venous system was performed, including characterization of spectral flow, compressibility, and phasicity. COMPARISON:  None. FINDINGS: There is complete compressibility of the right common femoral, femoral, and popliteal veins. Doppler analysis demonstrates respiratory phasicity and augmentation of flow with calf compression. No obvious superficial vein or calf vein thrombosis. IMPRESSION: No evidence of right lower extremity DVT. Electronically Signed   By: Marybelle Killings M.D.   On: 04/09/2017 15:34   Dg Hip Port Unilat W Or W/o Pelvis 1v Right  Result Date: 03/13/2017 CLINICAL DATA:  Status post right intramedullary nailing of the femur. EXAM: DG HIP (WITH OR WITHOUT PELVIS) 1V PORT RIGHT COMPARISON:  Intraoperative fluoro spot views of today's date FINDINGS: The patient has  undergone inter medullary nail and telescoping screw placement for an acute intertrochanteric fracture of the right femur alignment is near anatomic. The positioning of the prosthetic components is good. IMPRESSION: No immediate complication following intramedullary nail and telescoping screw placement in the proximal right femur. Electronically Signed    By: David  Martinique M.D.   On: 03/13/2017 16:51   Dg Hip Unilat With Pelvis 2-3 Views Right  Result Date: 03/28/2017 [facility] report Dictated by Dr. Aline Brochure Chief complaint 3 Pelvis AP and lateral right hip Short gamma nail fixes a intertrochanteric fracture with mild comminution of the greater trochanter Distal and proximal locking are in place and intact hardware looks good fracture is healing appropriately This is postop day 22    Time Spent in minutes  25   Cassidy Smith M.D on 04/12/2017 at 11:44 AM  Between 7am to 7pm - Pager - [phone]  After 7pm go to [email or web address] - password [facility]  [facility] -  Office  [phone]

## 2017-04-12 NOTE — Progress Notes (Signed)
Subjective: Interval History: has no complaint of nausea or vomiting.  Patient is she is feeling good and she want to go home.  Denies any difficulty breathing.  She did not have any dizziness or lightheadedness..  Objective: Vital signs in last 24 hours: Temp:  [97.5 F (36.4 C)-97.7 F (36.5 C)] 97.5 F (36.4 C) (03/30 0500) Pulse Rate:  [65-66] 65 (03/30 0500) Resp:  [16-18] 17 (03/30 0500) BP: (97-121)/(50-59) 106/56 (03/30 0500) SpO2:  [97 %-99 %] 99 % (03/30 0500) Weight change:   Intake/Output from previous day: 03/29 0701 - 03/30 0700 In: 990 [P.O.:990] Out: 400 [Urine:400] Intake/Output this shift: No intake/output data recorded.  General appearance: alert, cooperative and no distress Resp: clear to auscultation bilaterally Cardio: regular rate and rhythm Extremities: No edema  Lab Results: Recent Labs    04/10/17 0640 04/11/17 0859 04/12/17 0706  WBC 6.3  --  5.9  HGB 10.9* 11.0* 11.2*  HCT 33.6* 33.2* 34.1*  PLT 215  --  210   BMET:  Recent Labs    04/11/17 0859 04/12/17 0706  NA 121* 126*  K 4.4 3.7  CL 84* 88*  CO2 26 28  GLUCOSE 96 101*  BUN 9 11  CREATININE <0.30* 0.37*  CALCIUM 9.2 9.1   No results for input(s): PTH in the last 72 hours. Iron Studies: No results for input(s): IRON, TIBC, TRANSFERRIN, FERRITIN in the last 72 hours.  Studies/Results: No results found.  I have reviewed the patient's current medications.  Assessment/Plan: 1] hyponatremia: Her serum sodium is 258.  Low hence this is true hyponatremia.  Her uric acid is low normal.  Presently her urine sodium and osmolality is pending but from previous studies was high.  Possibly SIADH.  Presently she is on free water restriction and her sodium is 126 improving. Patient is also a symptomatic.   2] history of hyperthyroidism 3] history of anxiety disorder/depression 4] history of GI bleeding.  Her hemoglobin is stable Plan: Continue his present management We will check renal  panel in the morning   LOS: 9 days   Cassidy Smith S 04/12/2017,12:14 PM

## 2017-04-12 NOTE — Evaluation (Signed)
Physical Therapy Evaluation Patient Details Name: Cassidy Smith MRN: [id number] DOB: 1955-11-18 Today's Date: 04/12/2017   History of Present Illness  Cassidy Smith is a 62 y.o. female  from [facility] taking xarelto daily for anticoagulation for DVT treatment presented to ED with a complaint of maroon rectal bleeding that has been present for about a week per patient but noticed by staff today and she was sent to ED for further evaluation. The patient has some weakness but otherwise no chest pain or SOB symptoms.  PT was admitted on 2/21 with a fx hip; recieved an ORIF on 2/22 and was discharged on 03/14/2017.  Clinical Impression  Pt continues to improve in I in ambulation since her hip fx on 2/21but will continue to need skilled PT to maximize her independence.     Follow Up Recommendations SNF    Equipment Recommendations  None recommended by PT    Recommendations for Other Services       Precautions / Restrictions Precautions Precautions: Fall Restrictions Weight Bearing Restrictions: No RLE Weight Bearing: Weight bearing as tolerated      Mobility  Bed Mobility   Bed Mobility: Supine to Sit Rolling: Min guard            Transfers Overall transfer level: Modified independent Equipment used: Rolling walker (2 wheeled) Transfers: Sit to/from Stand Sit to Stand: Supervision Stand pivot transfers: Supervision          Ambulation/Gait Ambulation/Gait assistance: Supervision Ambulation Distance (Feet): 60 Feet Assistive device: Rolling walker (2 wheeled) Gait Pattern/deviations: Decreased step length - right;Decreased step length - left;Antalgic   Gait velocity interpretation: Below normal speed for age/gender              Pertinent Vitals/Pain Pain Assessment: No/denies pain    Home Living Family/patient expects to be discharged to:: Skilled nursing facility   Available Help at Discharge: [place] Type of Home: [place]                Prior Function   Pt has recently fx her hip. She has been at [facility] receiving rehab.               Hand Dominance        Extremity/Trunk Assessment   Upper Extremity Assessment Upper Extremity Assessment: Defer to OT evaluation    Lower Extremity Assessment Lower Extremity Assessment: Overall WFL for tasks assessed       Communication      Cognition Arousal/Alertness: Awake/alert Behavior During Therapy: WFL for tasks assessed/performed Overall Cognitive Status: Within Functional Limits for tasks assessed                                               Assessment/Plan    PT Assessment Patient needs continued PT services  PT Problem List Decreased activity tolerance;Decreased strength;Decreased balance       PT Treatment Interventions Gait training;Therapeutic exercise    PT Goals (Current goals can be found in the Care Plan section)  Acute Rehab PT Goals Patient Stated Goal: To walk by herself again PT Goal Formulation: With patient Time For Goal Achievement: 04/24/17 Potential to Achieve Goals: Good    Frequency Min 4X/week   Barriers to discharge              End of Session Equipment  Utilized During Treatment: Gait belt Activity Tolerance: Patient tolerated treatment well Patient left: in chair Nurse Communication: Mobility status PT Visit Diagnosis: Unsteadiness on feet (R26.81);Other abnormalities of gait and mobility (R26.89);History of falling (Z91.81)    Time: 3716-9678 PT Time Calculation (min) (ACUTE ONLY): 41 min   Charges:   PT Evaluation $PT Eval Low Complexity: [place], PT CLT [phone] 04/12/2017, 12:31 PM

## 2017-04-13 LAB — BASIC METABOLIC PANEL
ANION GAP: 8 (ref 5–15)
BUN: 10 mg/dL (ref 6–20)
CHLORIDE: 86 mmol/L — AB (ref 101–111)
CO2: 30 mmol/L (ref 22–32)
Calcium: 9.1 mg/dL (ref 8.9–10.3)
Creatinine, Ser: 0.4 mg/dL — ABNORMAL LOW (ref 0.44–1.00)
GFR calc non Af Amer: >60 mL/min (ref >60)
Glucose, Bld: 91 mg/dL (ref 65–99)
POTASSIUM: 4.1 mmol/L (ref 3.5–5.1)
Sodium: 124 mmol/L — ABNORMAL LOW (ref 135–145)

## 2017-04-13 LAB — RENAL FUNCTION PANEL
ALBUMIN: 2.6 g/dL — AB (ref 3.5–5.0)
Anion gap: 11 (ref 5–15)
BUN: 10 mg/dL (ref 6–20)
CALCIUM: 9.2 mg/dL (ref 8.9–10.3)
CO2: 27 mmol/L (ref 22–32)
CREATININE: 0.42 mg/dL — AB (ref 0.44–1.00)
Chloride: 87 mmol/L — ABNORMAL LOW (ref 101–111)
Glucose, Bld: 86 mg/dL (ref 65–99)
Phosphorus: 3 mg/dL (ref 2.5–4.6)
Potassium: 4.2 mmol/L (ref 3.5–5.1)
Sodium: 125 mmol/L — ABNORMAL LOW (ref 135–145)

## 2017-04-13 LAB — HEMOGLOBIN AND HEMATOCRIT, BLOOD
HEMATOCRIT: 32.1 % — AB (ref 36.0–46.0)
Hemoglobin: 10.4 g/dL — ABNORMAL LOW (ref 12.0–15.0)

## 2017-04-13 LAB — GLUCOSE, CAPILLARY: Glucose-Capillary: 88 mg/dL (ref 65–99)

## 2017-04-13 MED ORDER — DEMECLOCYCLINE HCL 150 MG PO TABS
150.0000 mg | ORAL_TABLET | Freq: Two times a day (BID) | ORAL | Status: DC
  Administered 2017-04-13 (×2): 150 mg via ORAL
  Filled 2017-04-13 (×7): qty 1

## 2017-04-13 NOTE — Progress Notes (Signed)
PROGRESS NOTE                                                                                                                                                                                                             Patient Demographics:    Cassidy Smith, is a 62 y.o. female, DOB - [birth year], TMH:[id number]  Admit date - 04/03/2017   Admitting Physician Murlean Iba, MD  Outpatient Primary MD for the patient is Neale Burly, MD  LOS - 10  Outpatient Specialists: None  Chief Complaint  Patient presents with  . Rectal Bleeding       Brief Narrative   61 year old female with history of DVT on chronic anticoagulation, protein calorie malnutrition, anxiety and depression who presented with painless rectal bleed of 1 week duration associated with generalized weakness. Patient underwent colonoscopy on 3/23 showing a large rectal polyp which was partially resected. Hospital course prolonged with worsened hyponatremia.    Subjective:   No overnight events.   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  Both surgery and GI recommend that the adenoma was too high up in the rectum and unable to perform transanal excision at our facility.  Recommend evaluation by surgery at [facility] where it can be excised with a combination of endoscopic/anoscopic instrumentation.  I have called [facility] surgery clinic and left a message to schedule an outpatient appointment.  (I was unable to reach of the staff on the phone).  If patient is not called with an appointment in 1 week the surgery clinic needs to be called back again.  Xarelto  resumed on 3/27.  No further rectal bleed and H&H remained stable.   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  Patient has had chronic/ recurrent hyponatremia with sodium level between 128-130.  Worsened to 121 on 3/29, is 124 today.  Continue Lasix with free water restriction.  Started on demeclocycline twice daily today. Renal consult appreciated.  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  Doppler lower extremity negative for DVT.  She was diagnosed of right lower extremity DVT on 03/12/2017 following a right displaced hip fracture and should be on anticoagulation for  at least 3 months until 06/09/2017.  Acute blood loss anemia. Stable.  Continue iron supplement.  Has not required transfusion.  Anxiety disorder and bipolar depression Continue Risperdal, Cogentin and Depakote.  (All these do not commonly cause hyponatremia)  Recent right hip and left radial fracture. Right hip IM nail done on 03/07/2017.  Also had sustained right nondisplaced radial styloid fracture which was managed nonsurgically.  PT evaluation.   Tobacco abuse Counseled strongly on cessation.  Hyperthyroidism Continue Tapazole  Stage I pressure injury of the skin over right hip Continue foam dressing   Severe protein calorie malnutrition Added supplement.        Code Status : Full code  Family Communication  : None at bedside  Disposition Plan  : SNF possibly tomorrow if sodium improving  Barriers For Discharge : Hyponatremia, awaiting passer number.  Consults  : Nephrology, GI  Procedures  : Colonoscopy  DVT Prophylaxis  : Xarelto  Lab Results  Component Value Date   PLT 210 04/12/2017    Antibiotics  :    Anti-infectives (From admission, onward)   Start     Dose/Rate Route Frequency Ordered Stop   04/13/17 1000  demeclocycline (DECLOMYCIN) tablet 150 mg     150 mg Oral Every 12 hours 04/13/17 0942          Objective:   Vitals:   04/11/17 2300 04/12/17 0500 04/12/17 2300 04/13/17 0458  BP: (!) 121/59 (!) 106/56 (!) 113/58 112/61  Pulse: 66 65 (!)  58 68  Resp: 16 17 20 15   Temp: 97.7 F (36.5 C) (!) 97.5 F (36.4 C) 97.7 F (36.5 C) 98.6 F (37 C)  TempSrc:      SpO2: 97% 99% 98% 93%  Weight:      Height:        Wt Readings from Last 3 Encounters:  04/11/17 51.3 kg (113 lb 1.5 oz)  03/14/17 63.8 kg (140 lb 10.5 oz)  01/26/16 84.4 kg (186 lb)     Intake/Output Summary (Last 24 hours) at 04/13/2017 1145 Last data filed at 04/13/2017 0400 Gross per 24 hour  Intake 720 ml  Output 450 ml  Net 270 ml     Physical Exam General: Not in distress HEENT: Pallor present, moist mucosa, supple neck Chest: Clear bilaterally CVS: Normal S1 and S2, no murmurs GI: Soft, nondistended, nontender Musculoskeletal: Warm, no edema      Data Review:    CBC Recent Labs  Lab 04/07/17 0550 04/08/17 0501 04/10/17 0640 04/11/17 0859 04/12/17 0706 04/13/17 0632  WBC 7.7 7.1 6.3  --  5.9  --   HGB 10.1* 10.5* 10.9* 11.0* 11.2* 10.4*  HCT 31.6* 32.8* 33.6* 33.2* 34.1* 32.1*  PLT 217 222 215  --  210  --   MCV 90.5 90.6 92.1  --  89.7  --   MCH 28.9 29.0 29.9  --  29.5  --   MCHC 32.0 32.0 32.4  --  32.8  --   RDW 18.7* 18.6* 18.2*  --  17.2*  --     Chemistries  Recent Labs  Lab 04/08/17 0501 04/10/17 0640 04/11/17 0859 04/12/17 0706 04/13/17 0632  NA 126* 130* 121* 126* 125*  124*  K 4.1 4.0 4.4 3.7 4.2  4.1  CL 89* 93* 84* 88* 87*  86*  CO2 29 28 26 28 27  30   GLUCOSE 90 84 96 101* 86  91  BUN 15 10 9 11 10  10   CREATININE  0.40* 0.37* <0.30* 0.37* 0.42*  0.40*  CALCIUM 9.3 9.1 9.2 9.1 9.2  9.1   ------------------------------------------------------------------------------------------------------------------ No results for input(s): CHOL, HDL, LDLCALC, TRIG, CHOLHDL, LDLDIRECT in the last 72 hours.  Lab Results  Component Value Date   HGBA1C 5.4 05/13/2013   ------------------------------------------------------------------------------------------------------------------ No results for input(s):  TSH, T4TOTAL, T3FREE, THYROIDAB in the last 72 hours.  Invalid input(s): FREET3 ------------------------------------------------------------------------------------------------------------------ No results for input(s): VITAMINB12, FOLATE, FERRITIN, TIBC, IRON, RETICCTPCT in the last 72 hours.  Coagulation profile No results for input(s): INR, PROTIME in the last 168 hours.  No results for input(s): DDIMER in the last 72 hours.  Cardiac Enzymes No results for input(s): CKMB, TROPONINI, MYOGLOBIN in the last 168 hours.  Invalid input(s): CK ------------------------------------------------------------------------------------------------------------------ No results found for: BNP  Inpatient Medications  Scheduled Meds: . benztropine  1 mg Oral BID  . demeclocycline  150 mg Oral Q12H  . divalproex  1,500 mg Oral QHS  . feeding supplement (ENSURE ENLIVE)  237 mL Oral BID BM  . ferrous sulfate  325 mg Oral Q breakfast  . furosemide  20 mg Oral Daily  . magnesium oxide  400 mg Oral BID  . methimazole  10 mg Oral Daily  . nicotine  14 mg Transdermal Daily  . pantoprazole  40 mg Oral BID AC  . risperiDONE  4 mg Oral QHS  . rivaroxaban  20 mg Oral Q supper  . senna-docusate  1 tablet Oral BID  . sodium chloride flush  3 mL Intravenous Q12H  . traZODone  200 mg Oral QHS  . vitamin B-12  1,000 mcg Oral Daily   Continuous Infusions: . sodium chloride     PRN Meds:.sodium chloride, acetaminophen **OR** acetaminophen, bisacodyl, ondansetron **OR** ondansetron (ZOFRAN) IV, sodium chloride flush  Micro Results Recent Results (from the past 240 hour(s))  MRSA PCR Screening     Status: None   Collection Time: 04/04/17 12:03 AM  Result Value Ref Range Status   MRSA by PCR NEGATIVE NEGATIVE Final    Comment:        The GeneXpert MRSA Assay (FDA approved for NASAL specimens only), is one component of a comprehensive MRSA colonization surveillance program. It is not intended to  diagnose MRSA infection nor to guide or monitor treatment for MRSA infections. Performed at [facility], [address], [place], [place] [address]     Radiology Reports Dg Chest 2 View  Result Date: 04/05/2017 CLINICAL DATA:  Hyponatremia and weight loss EXAM: CHEST - 2 VIEW COMPARISON:  03/06/2017 FINDINGS: Cardiac shadow is stable. The lungs are hyperinflated. Mild blunting of the costophrenic angle is noted on the left likely related to small effusion. No focal infiltrate is seen. No bony abnormality is noted. IMPRESSION: Small pleural effusion on the left. No other focal abnormality is seen. Electronically Signed   By: Inez Catalina M.D.   On: 04/05/2017 19:23   US Venous Img Lower Unilateral Right  Result Date: 04/09/2017 CLINICAL DATA:  Right thigh pain EXAM: RIGHT LOWER EXTREMITY VENOUS DUPLEX ULTRASOUND TECHNIQUE: Doppler venous assessment of the right lower extremity deep venous system was performed, including characterization of spectral flow, compressibility, and phasicity. COMPARISON:  None. FINDINGS: There is complete compressibility of the right common femoral, femoral, and popliteal veins. Doppler analysis demonstrates respiratory phasicity and augmentation of flow with calf compression. No obvious superficial vein or calf vein thrombosis. IMPRESSION: No evidence of right lower extremity DVT. Electronically Signed   By: Marybelle Killings M.D.   On:  04/09/2017 15:34   Dg Hip Unilat With Pelvis 2-3 Views Right  Result Date: 03/28/2017 [facility] report Dictated by Dr. Aline Brochure Chief complaint 3 Pelvis AP and lateral right hip Short gamma nail fixes a intertrochanteric fracture with mild comminution of the greater trochanter Distal and proximal locking are in place and intact hardware looks good fracture is healing appropriately This is postop day 22    Time Spent in minutes  25   Breanda Greenlaw M.D on 04/13/2017 at 11:45 AM  Between 7am to 7pm - Pager -  [phone]  After 7pm go to [email or web address] - password [facility]  [facility] -  Office  [phone]

## 2017-04-13 NOTE — Progress Notes (Signed)
Subjective: Interval History: Patient offers no complaints.  She denies any weakness or dizziness.  Her appetite is good.  Objective: Vital signs in last 24 hours: Temp:  [97.7 F (36.5 C)-98.6 F (37 C)] 98.6 F (37 C) (03/31 0458) Pulse Rate:  [58-68] 68 (03/31 0458) Resp:  [15-20] 15 (03/31 0458) BP: (112-113)/(58-61) 112/61 (03/31 0458) SpO2:  [93 %-98 %] 93 % (03/31 0458) Weight change:   Intake/Output from previous day: 03/30 0701 - 03/31 0700 In: 960 [P.O.:960] Out: 450 [Urine:450] Intake/Output this shift: No intake/output data recorded.  General appearance: alert, cooperative and no distress Resp: clear to auscultation bilaterally Cardio: regular rate and rhythm Extremities: No edema  Lab Results: Recent Labs    04/12/17 0706 04/13/17 0632  WBC 5.9  --   HGB 11.2* 10.4*  HCT 34.1* 32.1*  PLT 210  --    BMET:  Recent Labs    04/12/17 0706 04/13/17 0632  NA 126* 125*  124*  K 3.7 4.2  4.1  CL 88* 87*  86*  CO2 28 27  30   GLUCOSE 101* 86  91  BUN 11 10  10   CREATININE 0.37* 0.42*  0.40*  CALCIUM 9.1 9.2  9.1   No results for input(s): PTH in the last 72 hours. Iron Studies: No results for input(s): IRON, TIBC, TRANSFERRIN, FERRITIN in the last 72 hours.  Studies/Results: No results found.  I have reviewed the patient's current medications.  Assessment/Plan: 1] hyponatremia: Possible SIADH.  Her sodium is 124 and has declined.  Patient presently on free water restriction and Lasix. 2] history of hyperthyroidism 3] history of anxiety disorder/depression 4] history of GI bleeding.  Her hemoglobin is stable Plan: Continue his present management We will check renal panel in the morning We will start patient on demeclocycline 150 mg p.o. twice daily   LOS: 10 days   Kimley Apsey S 04/13/2017,9:33 AM

## 2017-04-14 LAB — RENAL FUNCTION PANEL
ALBUMIN: 2.5 g/dL — AB (ref 3.5–5.0)
Anion gap: 8 (ref 5–15)
BUN: 10 mg/dL (ref 6–20)
CHLORIDE: 87 mmol/L — AB (ref 101–111)
CO2: 29 mmol/L (ref 22–32)
Calcium: 9.2 mg/dL (ref 8.9–10.3)
Creatinine, Ser: 0.37 mg/dL — ABNORMAL LOW (ref 0.44–1.00)
Glucose, Bld: 90 mg/dL (ref 65–99)
PHOSPHORUS: 3.1 mg/dL (ref 2.5–4.6)
POTASSIUM: 4.1 mmol/L (ref 3.5–5.1)
Sodium: 124 mmol/L — ABNORMAL LOW (ref 135–145)

## 2017-04-14 LAB — HEMOGLOBIN AND HEMATOCRIT, BLOOD
HCT: 31.8 % — ABNORMAL LOW (ref 36.0–46.0)
HEMOGLOBIN: 10.5 g/dL — AB (ref 12.0–15.0)

## 2017-04-14 MED ORDER — SODIUM CHLORIDE 1 G PO TABS
1.0000 g | ORAL_TABLET | Freq: Two times a day (BID) | ORAL | Status: DC
  Administered 2017-04-14 (×2): 1 g via ORAL
  Filled 2017-04-14 (×3): qty 1

## 2017-04-14 MED ORDER — DEMECLOCYCLINE HCL 150 MG PO TABS
300.0000 mg | ORAL_TABLET | Freq: Two times a day (BID) | ORAL | Status: DC
  Administered 2017-04-14 – 2017-04-17 (×7): 300 mg via ORAL
  Filled 2017-04-14 (×9): qty 2

## 2017-04-14 NOTE — Progress Notes (Signed)
PROGRESS NOTE                                                                                                                                                                                                             Patient Demographics:    Cassidy Smith, is a 62 y.o. female, DOB - 03-30-1955, MGQ:[id number]  Admit date - 04/03/2017   Admitting Physician Murlean Iba, MD  Outpatient Primary MD for the patient is Neale Burly, MD  LOS - 11  Outpatient Specialists: None  Chief Complaint  Patient presents with  . Rectal Bleeding       Brief Narrative   62 year old female with history of DVT on chronic anticoagulation, protein calorie malnutrition, anxiety and depression who presented with painless rectal bleed of 1 week duration associated with generalized weakness. Patient underwent colonoscopy on 3/23 showing a large rectal polyp which was partially resected. Hospital course prolonged with worsened hyponatremia.    Subjective:   No overnight events.   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  Both surgery and GI recommend that the adenoma was too high up in the rectum and unable to perform transanal excision at our facility.  Recommend evaluation by surgery at [facility] where it can be excised with a combination of endoscopic/anoscopic instrumentation.  I have called [facility] surgery clinic and left a message to schedule an outpatient appointment.  (I was unable to reach of the staff on the phone).  If patient is not called with an appointment in 1 week the surgery clinic needs to be called back again.  Xarelto  resumed on 3/27.  No further rectal bleed and H&H remained stable.   Active problems Hyponatremia Secondary  to SIADH.  Patient has had chronic/ recurrent hyponatremia with sodium level between 128-130.  Sodium level remains low despite Lasix and water restriction.  Started demeclocycline on 3/31 and is still 124 today.  Will discuss with nephrology further (may need salt tablets)  History of DVT on chronic Xarelto Xarelto resumed and H&H stable.  Doppler lower extremity negative for DVT.  She was diagnosed of right lower extremity DVT on 03/12/2017 following a right displaced hip fracture and  should be on anticoagulation for at least 3 months until 06/09/2017.  Acute blood loss anemia. Stable.  Continue iron supplement.  Has not required transfusion.  Anxiety disorder and bipolar depression Continue Risperdal, Cogentin and Depakote.  (All these do not commonly cause hyponatremia)  Recent right hip and left radial fracture. Right hip IM nail done on 03/07/2017.  Also had sustained right nondisplaced radial styloid fracture which was managed nonsurgically.  PT evaluation.   Tobacco abuse Counseled strongly on cessation.  Hyperthyroidism Continue Tapazole  Stage I pressure injury of the skin over right hip Continue foam dressing   Severe protein calorie malnutrition Added supplement.        Code Status : Full code  Family Communication  : None at bedside  Disposition Plan  : SNF once sodium level improved adequately  Barriers For Discharge : Hyponatremia,  Consults  : Nephrology, GI  Procedures  : Colonoscopy  DVT Prophylaxis  : Xarelto  Lab Results  Component Value Date   PLT 210 04/12/2017    Antibiotics  :    Anti-infectives (From admission, onward)   Start     Dose/Rate Route Frequency Ordered Stop   04/14/17 1000  demeclocycline (DECLOMYCIN) tablet 300 mg     300 mg Oral Every 12 hours 04/14/17 0838     04/13/17 1000  demeclocycline (DECLOMYCIN) tablet 150 mg  Status:  Discontinued     150 mg Oral Every 12 hours 04/13/17 0942 04/14/17 0838         Objective:   Vitals:   04/12/17 2300 04/13/17 0458 04/13/17 1437 04/14/17 0659  BP: (!) 113/58 112/61 (!) 116/53 110/64  Pulse: (!) 58 68  66  Resp: 20 15 16 15   Temp: 97.7 F (36.5 C) 98.6 F (37 C) 98.2 F (36.8 C) 98.7 F (37.1 C)  TempSrc:   Oral Oral  SpO2: 98% 93% 93% 94%  Weight:      Height:        Wt Readings from Last 3 Encounters:  04/11/17 51.3 kg (113 lb 1.5 oz)  03/14/17 63.8 kg (140 lb 10.5 oz)  01/26/16 84.4 kg (186 lb)     Intake/Output Summary (Last 24 hours) at 04/14/2017 1053 Last data filed at 04/14/2017 0900 Gross per 24 hour  Intake 480 ml  Output 1050 ml  Net -570 ml     Physical Exam General: Not in distress HEENT: Moist mucosa, supple neck, Chest: Clear bilaterally CVS: Normal S1 and S2, no murmurs GI: Soft, nondistended, nontender Musculaoskeletal: Warm, no edema       Data Review:    CBC Recent Labs  Lab 04/08/17 0501 04/10/17 0640 04/11/17 0859 04/12/17 0706 04/13/17 0632 04/14/17 0659  WBC 7.1 6.3  --  5.9  --   --   HGB 10.5* 10.9* 11.0* 11.2* 10.4* 10.5*  HCT 32.8* 33.6* 33.2* 34.1* 32.1* 31.8*  PLT 222 215  --  210  --   --   MCV 90.6 92.1  --  89.7  --   --   MCH 29.0 29.9  --  29.5  --   --   MCHC 32.0 32.4  --  32.8  --   --   RDW 18.6* 18.2*  --  17.2*  --   --     Chemistries  Recent Labs  Lab 04/10/17 0640 04/11/17 0859 04/12/17 0706 04/13/17 0632 04/14/17 0659  NA 130* 121* 126* 125*  124* 124*  K 4.0 4.4 3.7 4.2  4.1  4.1  CL 93* 84* 88* 87*  86* 87*  CO2 28 26 28 27  30 29   GLUCOSE 84 96 101* 86  91 90  BUN 10 9 11 10  10 10   CREATININE 0.37* <0.30* 0.37* 0.42*  0.40* 0.37*  CALCIUM 9.1 9.2 9.1 9.2  9.1 9.2   ------------------------------------------------------------------------------------------------------------------ No results for input(s): CHOL, HDL, LDLCALC, TRIG, CHOLHDL, LDLDIRECT in the last 72 hours.  Lab Results  Component Value Date   HGBA1C 5.4 05/13/2013    ------------------------------------------------------------------------------------------------------------------ No results for input(s): TSH, T4TOTAL, T3FREE, THYROIDAB in the last 72 hours.  Invalid input(s): FREET3 ------------------------------------------------------------------------------------------------------------------ No results for input(s): VITAMINB12, FOLATE, FERRITIN, TIBC, IRON, RETICCTPCT in the last 72 hours.  Coagulation profile No results for input(s): INR, PROTIME in the last 168 hours.  No results for input(s): DDIMER in the last 72 hours.  Cardiac Enzymes No results for input(s): CKMB, TROPONINI, MYOGLOBIN in the last 168 hours.  Invalid input(s): CK ------------------------------------------------------------------------------------------------------------------ No results found for: BNP  Inpatient Medications  Scheduled Meds: . benztropine  1 mg Oral BID  . demeclocycline  300 mg Oral Q12H  . divalproex  1,500 mg Oral QHS  . feeding supplement (ENSURE ENLIVE)  237 mL Oral BID BM  . ferrous sulfate  325 mg Oral Q breakfast  . furosemide  20 mg Oral Daily  . magnesium oxide  400 mg Oral BID  . methimazole  10 mg Oral Daily  . nicotine  14 mg Transdermal Daily  . pantoprazole  40 mg Oral BID AC  . risperiDONE  4 mg Oral QHS  . rivaroxaban  20 mg Oral Q supper  . senna-docusate  1 tablet Oral BID  . sodium chloride flush  3 mL Intravenous Q12H  . sodium chloride  1 g Oral BID WC  . traZODone  200 mg Oral QHS  . vitamin B-12  1,000 mcg Oral Daily   Continuous Infusions: . sodium chloride     PRN Meds:.sodium chloride, acetaminophen **OR** acetaminophen, bisacodyl, ondansetron **OR** ondansetron (ZOFRAN) IV, sodium chloride flush  Micro Results No results found for this or any previous visit (from the past 240 hour(s)).  Radiology Reports Dg Chest 2 View  Result Date: 04/05/2017 CLINICAL DATA:  Hyponatremia and weight loss EXAM: CHEST - 2  VIEW COMPARISON:  03/06/2017 FINDINGS: Cardiac shadow is stable. The lungs are hyperinflated. Mild blunting of the costophrenic angle is noted on the left likely related to small effusion. No focal infiltrate is seen. No bony abnormality is noted. IMPRESSION: Small pleural effusion on the left. No other focal abnormality is seen. Electronically Signed   By: Inez Catalina M.D.   On: 04/05/2017 19:23   US Venous Img Lower Unilateral Right  Result Date: 04/09/2017 CLINICAL DATA:  Right thigh pain EXAM: RIGHT LOWER EXTREMITY VENOUS DUPLEX ULTRASOUND TECHNIQUE: Doppler venous assessment of the right lower extremity deep venous system was performed, including characterization of spectral flow, compressibility, and phasicity. COMPARISON:  None. FINDINGS: There is complete compressibility of the right common femoral, femoral, and popliteal veins. Doppler analysis demonstrates respiratory phasicity and augmentation of flow with calf compression. No obvious superficial vein or calf vein thrombosis. IMPRESSION: No evidence of right lower extremity DVT. Electronically Signed   By: Marybelle Killings M.D.   On: 04/09/2017 15:34   Dg Hip Unilat With Pelvis 2-3 Views Right  Result Date: 03/28/2017 [place] [facility] report Dictated by Dr. Aline Brochure Chief complaint 3 Pelvis AP and lateral right hip Short gamma nail fixes  a intertrochanteric fracture with mild comminution of the greater trochanter Distal and proximal locking are in place and intact hardware looks good fracture is healing appropriately This is postop day 22    Time Spent in minutes  25   Avi Kerschner M.D on 04/14/2017 at 10:53 AM  Between 7am to 7pm - Pager - [phone]  After 7pm go to [email or web address] - password [facility]  [facility] -  Office  [phone]

## 2017-04-14 NOTE — Progress Notes (Signed)
Subjective: Interval History: Patient denies any weakness, dizziness or lightheadedness.  She is feeling okay.  She offers no complaints.  Objective: Vital signs in last 24 hours: Temp:  [98.2 F (36.8 C)-98.7 F (37.1 C)] 98.7 F (37.1 C) (04/01 0659) Pulse Rate:  [66] 66 (04/01 0659) Resp:  [15-16] 15 (04/01 0659) BP: (110-116)/(53-64) 110/64 (04/01 0659) SpO2:  [93 %-94 %] 94 % (04/01 0659) Weight change:   Intake/Output from previous day: 03/31 0701 - 04/01 0700 In: 480 [P.O.:480] Out: 1050 [Urine:1050] Intake/Output this shift: No intake/output data recorded.  General appearance: alert, cooperative and no distress Resp: clear to auscultation bilaterally Cardio: regular rate and rhythm Extremities: No edema  Lab Results: Recent Labs    04/12/17 0706 04/13/17 0632 04/14/17 0659  WBC 5.9  --   --   HGB 11.2* 10.4* 10.5*  HCT 34.1* 32.1* 31.8*  PLT 210  --   --    BMET:  Recent Labs    04/13/17 0632 04/14/17 0659  NA 125*  124* 124*  K 4.2  4.1 4.1  CL 87*  86* 87*  CO2 27  30 29   GLUCOSE 86  91 90  BUN 10  10 10   CREATININE 0.42*  0.40* 0.37*  CALCIUM 9.2  9.1 9.2   No results for input(s): PTH in the last 72 hours. Iron Studies: No results for input(s): IRON, TIBC, TRANSFERRIN, FERRITIN in the last 72 hours.  Studies/Results: No results found.  I have reviewed the patient's current medications.  Assessment/Plan: 1] hyponatremia: Possible SIADH.  Her sodium is 124 and has declined.  Patient is started on demeclocycline.  Her sodium did not improve.  Patient however is asymptomatic. ?nicotine: Stable 2] history of hyperthyroidism 3] history of anxiety disorder/depression 4] history of GI bleeding.  Her hemoglobin is stable Plan:  sodium tablets 1 g p.o. twice daily We will check renal panel in the morning We will start patient on demeclocycline 300 mg p.o. twice daily   LOS: 11 days   Cylus Douville S 04/14/2017,8:35 AM

## 2017-04-15 LAB — RENAL FUNCTION PANEL
ALBUMIN: 2.5 g/dL — AB (ref 3.5–5.0)
ANION GAP: 8 (ref 5–15)
BUN: 9 mg/dL (ref 6–20)
CALCIUM: 9 mg/dL (ref 8.9–10.3)
CO2: 29 mmol/L (ref 22–32)
CREATININE: 0.41 mg/dL — AB (ref 0.44–1.00)
Chloride: 87 mmol/L — ABNORMAL LOW (ref 101–111)
GFR calc Af Amer: >60 mL/min (ref >60)
GFR calc non Af Amer: >60 mL/min (ref >60)
Glucose, Bld: 88 mg/dL (ref 65–99)
PHOSPHORUS: 3 mg/dL (ref 2.5–4.6)
Potassium: 4 mmol/L (ref 3.5–5.1)
SODIUM: 124 mmol/L — AB (ref 135–145)

## 2017-04-15 LAB — HEMOGLOBIN AND HEMATOCRIT, BLOOD
HEMATOCRIT: 32.2 % — AB (ref 36.0–46.0)
HEMOGLOBIN: 10.7 g/dL — AB (ref 12.0–15.0)

## 2017-04-15 MED ORDER — SODIUM CHLORIDE 1 G PO TABS
1.0000 g | ORAL_TABLET | Freq: Three times a day (TID) | ORAL | Status: DC
  Administered 2017-04-15 – 2017-04-16 (×4): 1 g via ORAL
  Filled 2017-04-15 (×3): qty 1

## 2017-04-15 MED ORDER — SODIUM CHLORIDE 1 G PO TABS: 1.0000 g | ORAL_TABLET | Freq: Three times a day (TID) | ORAL | Status: DC

## 2017-04-15 NOTE — Progress Notes (Addendum)
PROGRESS NOTE                                                                                                                                                                                                             Patient Demographics:    Cassidy Smith, is a 62 y.o. female, DOB - February 24, 1955, KVQ:[id number]  Admit date - 04/03/2017   Admitting Physician Murlean Iba, MD  Outpatient Primary MD for the patient is Neale Burly, MD  LOS - 12  Outpatient Specialists: None  Chief Complaint  Patient presents with  . Rectal Bleeding       Brief Narrative   62 year old female with history of DVT on chronic anticoagulation, protein calorie malnutrition, anxiety and depression who presented with painless rectal bleed of 1 week duration associated with generalized weakness. Patient underwent colonoscopy on 3/23 showing a large rectal polyp which was partially resected. Hospital course prolonged with worsened hyponatremia.    Subjective:   No overnight events.  Frustrated on having to stay long in the hospital.   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  Both surgery and GI recommend that the adenoma was too high up in the rectum and unable to perform transanal excision at our facility.  Recommend evaluation by surgery at [facility] where it can be excised with a combination of endoscopic/anoscopic instrumentation.  Scheduled patient for appointment to see Dr waters at [facility]  ([address], [place], 2nd floor) on 04/29/2017 9 am.  Xarelto  resumed on 3/27.  No further rectal bleed and H&H remained stable.   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  Patient has had chronic/ recurrent hyponatremia  with sodium level between 128-130.  Sodium level remains low despite Lasix and water restriction followed by starting demeclocycline and salt tablets. D/w with nephrology, not on any meds that could contribute to hyponatremia. On nicotine patch which I will discontinue as it can cause hyponatremia. .   -Increased salt tablets today, allowed liberal diet. - CXR from 3/23 negative for any mass lesion in lung.   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  Doppler lower extremity negative for DVT.  She was diagnosed of right lower extremity DVT on 03/12/2017 following a right displaced hip fracture and should be on anticoagulation for at least 3 months until 06/09/2017.  Acute blood loss anemia. Stable.  Continue iron supplement.  Has not required transfusion.  Anxiety disorder and bipolar depression Continue Risperdal, Cogentin and Depakote.  (All these do not commonly cause hyponatremia)  Recent right hip and left radial fracture. Right hip IM nail done on 03/07/2017.  Also had sustained right nondisplaced radial styloid fracture which was managed nonsurgically.  Needs to return to SNF   Tobacco abuse Counseled strongly on cessation.  D/c nicotine patch for persistent hyponatremia.   Hyperthyroidism Continue Tapazole  Stage I pressure injury of the skin over right hip Continue foam dressing.   Severe protein calorie malnutrition Added supplement.        Code Status : Full code  Family Communication  : None at bedside  Disposition Plan  : SNF once sodium level improved adequately  Barriers For Discharge : Hyponatremia,  Consults  : Nephrology, GI  Procedures  : Colonoscopy  DVT Prophylaxis  : Xarelto  Lab Results  Component Value Date   PLT 210 04/12/2017    Antibiotics  :    Anti-infectives (From admission, onward)   Start     Dose/Rate Route Frequency Ordered Stop   04/14/17 1000  demeclocycline (DECLOMYCIN) tablet 300 mg      300 mg Oral Every 12 hours 04/14/17 0838     04/13/17 1000  demeclocycline (DECLOMYCIN) tablet 150 mg  Status:  Discontinued     150 mg Oral Every 12 hours 04/13/17 0942 04/14/17 0838        Objective:   Vitals:   04/14/17 0659 04/14/17 1300 04/14/17 2151 04/15/17 0628  BP: 110/64 (!) 87/52 (!) 122/51 115/60  Pulse: 66 70 65 62  Resp: 15 16 16 16   Temp: 98.7 F (37.1 C) 97.7 F (36.5 C) 97.9 F (36.6 C) 99 F (37.2 C)  TempSrc: Oral Oral Oral Oral  SpO2: 94% 98% 98% 95%  Weight:      Height:        Wt Readings from Last 3 Encounters:  04/11/17 51.3 kg (113 lb 1.5 oz)  03/14/17 63.8 kg (140 lb 10.5 oz)  01/26/16 84.4 kg (186 lb)     Intake/Output Summary (Last 24 hours) at 04/15/2017 1342 Last data filed at 04/14/2017 2200 Gross per 24 hour  Intake 240 ml  Output 800 ml  Net -560 ml     Physical Exam Gen: NAD  HEENT: moist mucosa, supple neck  chest: clear b/l  CVS: NS1&S2, no murmurs GI: soft, NT, ND, Musculoskeletal: warm, no edema       Data Review:    CBC Recent Labs  Lab 04/10/17 0640 04/11/17 0859 04/12/17 0706 04/13/17 0632 04/14/17 0659 04/15/17 0439  WBC 6.3  --  5.9  --   --   --   HGB 10.9* 11.0* 11.2* 10.4* 10.5* 10.7*  HCT 33.6* 33.2* 34.1* 32.1* 31.8* 32.2*  PLT 215  --  210  --   --   --   MCV 92.1  --  89.7  --   --   --   MCH 29.9  --  29.5  --   --   --   MCHC 32.4  --  32.8  --   --   --   RDW 18.2*  --  17.2*  --   --   --  Chemistries  Recent Labs  Lab 04/11/17 0859 04/12/17 0706 04/13/17 0632 04/14/17 0659 04/15/17 0439  NA 121* 126* 125*  124* 124* 124*  K 4.4 3.7 4.2  4.1 4.1 4.0  CL 84* 88* 87*  86* 87* 87*  CO2 26 28 27  30 29 29   GLUCOSE 96 101* 86  91 90 88  BUN 9 11 10  10 10 9   CREATININE <0.30* 0.37* 0.42*  0.40* 0.37* 0.41*  CALCIUM 9.2 9.1 9.2  9.1 9.2 9.0   ------------------------------------------------------------------------------------------------------------------ No results for  input(s): CHOL, HDL, LDLCALC, TRIG, CHOLHDL, LDLDIRECT in the last 72 hours.  Lab Results  Component Value Date   HGBA1C 5.4 05/13/2013   ------------------------------------------------------------------------------------------------------------------ No results for input(s): TSH, T4TOTAL, T3FREE, THYROIDAB in the last 72 hours.  Invalid input(s): FREET3 ------------------------------------------------------------------------------------------------------------------ No results for input(s): VITAMINB12, FOLATE, FERRITIN, TIBC, IRON, RETICCTPCT in the last 72 hours.  Coagulation profile No results for input(s): INR, PROTIME in the last 168 hours.  No results for input(s): DDIMER in the last 72 hours.  Cardiac Enzymes No results for input(s): CKMB, TROPONINI, MYOGLOBIN in the last 168 hours.  Invalid input(s): CK ------------------------------------------------------------------------------------------------------------------ No results found for: BNP  Inpatient Medications  Scheduled Meds: . benztropine  1 mg Oral BID  . demeclocycline  300 mg Oral Q12H  . divalproex  1,500 mg Oral QHS  . feeding supplement (ENSURE ENLIVE)  237 mL Oral BID BM  . ferrous sulfate  325 mg Oral Q breakfast  . furosemide  20 mg Oral Daily  . magnesium oxide  400 mg Oral BID  . methimazole  10 mg Oral Daily  . pantoprazole  40 mg Oral BID AC  . risperiDONE  4 mg Oral QHS  . rivaroxaban  20 mg Oral Q supper  . senna-docusate  1 tablet Oral BID  . sodium chloride flush  3 mL Intravenous Q12H  . sodium chloride  1 g Oral TID WC  . traZODone  200 mg Oral QHS  . vitamin B-12  1,000 mcg Oral Daily   Continuous Infusions: . sodium chloride     PRN Meds:.sodium chloride, acetaminophen **OR** acetaminophen, bisacodyl, ondansetron **OR** ondansetron (ZOFRAN) IV, sodium chloride flush  Micro Results No results found for this or any previous visit (from the past 240 hour(s)).  Radiology Reports Dg  Chest 2 View  Result Date: 04/05/2017 CLINICAL DATA:  Hyponatremia and weight loss EXAM: CHEST - 2 VIEW COMPARISON:  03/06/2017 FINDINGS: Cardiac shadow is stable. The lungs are hyperinflated. Mild blunting of the costophrenic angle is noted on the left likely related to small effusion. No focal infiltrate is seen. No bony abnormality is noted. IMPRESSION: Small pleural effusion on the left. No other focal abnormality is seen. Electronically Signed   By: Inez Catalina M.D.   On: 04/05/2017 19:23   US Venous Img Lower Unilateral Right  Result Date: 04/09/2017 CLINICAL DATA:  Right thigh pain EXAM: RIGHT LOWER EXTREMITY VENOUS DUPLEX ULTRASOUND TECHNIQUE: Doppler venous assessment of the right lower extremity deep venous system was performed, including characterization of spectral flow, compressibility, and phasicity. COMPARISON:  None. FINDINGS: There is complete compressibility of the right common femoral, femoral, and popliteal veins. Doppler analysis demonstrates respiratory phasicity and augmentation of flow with calf compression. No obvious superficial vein or calf vein thrombosis. IMPRESSION: No evidence of right lower extremity DVT. Electronically Signed   By: Marybelle Killings M.D.   On: 04/09/2017 15:34   Dg Hip Unilat With Pelvis 2-3 Views Right  Result Date: 03/28/2017 [facility] report Dictated by Dr. Aline Brochure Chief complaint 3 Pelvis AP and lateral right hip Short gamma nail fixes a intertrochanteric fracture with mild comminution of the greater trochanter Distal and proximal locking are in place and intact hardware looks good fracture is healing appropriately This is postop day 22    Time Spent in minutes  25   Song Garris M.D on 04/15/2017 at 1:42 PM  Between 7am to 7pm - Pager - [phone]  After 7pm go to [email or web address] - password [facility]  [facility] -  Office  [phone]

## 2017-04-15 NOTE — Progress Notes (Signed)
Subjective: Interval History: Patient denies any nausea or vomiting.  Denies also any pain.  Presently she does not have any difficulty breathing.  Objective: Vital signs in last 24 hours: Temp:  [97.7 F (36.5 C)-99 F (37.2 C)] 99 F (37.2 C) (04/02 0628) Pulse Rate:  [62-70] 62 (04/02 0628) Resp:  [16] 16 (04/02 0628) BP: (87-122)/(51-60) 115/60 (04/02 0628) SpO2:  [95 %-98 %] 95 % (04/02 0628) Weight change:   Intake/Output from previous day: 04/01 0701 - 04/02 0700 In: 480 [P.O.:480] Out: 1000 [Urine:1000] Intake/Output this shift: No intake/output data recorded.  General appearance: alert, cooperative and no distress Resp: clear to auscultation bilaterally Cardio: regular rate and rhythm Extremities: No edema  Lab Results: Recent Labs    04/14/17 0659 04/15/17 0439  HGB 10.5* 10.7*  HCT 31.8* 32.2*   BMET:  Recent Labs    04/14/17 0659 04/15/17 0439  NA 124* 124*  K 4.1 4.0  CL 87* 87*  CO2 29 29  GLUCOSE 90 88  BUN 10 9  CREATININE 0.37* 0.41*  CALCIUM 9.2 9.0   No results for input(Smith): PTH in the last 72 hours. Iron Studies: No results for input(Smith): IRON, TIBC, TRANSFERRIN, FERRITIN in the last 72 hours.  Studies/Results: No results found.  I have reviewed the patient'Smith current medications.  Assessment/Plan: 1] hyponatremia: Possible SIADH.  Her sodium is 124 is stable..  Patient is started on demeclocycline and salt tablet. Etiology at this moment is not clear.?nicotine: Stable 2] history of hyperthyroidism 3] history of anxiety disorder/depression 4] history of GI bleeding.  Her hemoglobin is stable Plan: 1] we will increase sodium tablet 1 g p.o. 3 times a day  2] we will check renal panel in the morning 3] possibly DC nicotine patch 4] if her sodium declines further we may try to use tolvaptan   LOS: 12 days   Cassidy Smith 04/15/2017,9:14 AM

## 2017-04-15 NOTE — Progress Notes (Signed)
Physical Therapy Treatment Patient Details Name: Cassidy Smith MRN: [id number] DOB: 18-Apr-1955 Today's Date: 04/15/2017    History of Present Illness Cassidy Smith is a 62 y.o. female  from [facility] taking xarelto daily for anticoagulation for DVT treatment presented to ED with a complaint of maroon rectal bleeding that has been present for about a week per patient but noticed by staff today and she was sent to ED for further evaluation. The patient has some weakness but otherwise no chest pain or SOB symptoms.  PT was admitted on 2/21 with a fx hip; recieved an ORIF on 2/22 and was discharged on 03/14/2017.    PT Comments    Pt received from nursing and was agreeable to PT treatment. Pt assisted to commode for BM; once at sink washing hands, pt demo'ing poor standing balance as she had increased lean to the R and required cues to correct. Pt able to ambulate 107ft with RW and upon return to chair, pt stated she needed to throw up. PT provided pt with pan to throw up in and notified nursing immediately. Pt needs continued PT to address deficits remaining and continue to recommend venue below to further promote improved strength, balance, and gait.    Follow Up Recommendations  SNF     Equipment Recommendations  None recommended by PT    Recommendations for Other Services       Precautions / Restrictions Precautions Precautions: Fall Restrictions Weight Bearing Restrictions: No RLE Weight Bearing: Weight bearing as tolerated    Mobility  Bed Mobility Overal bed mobility: Needs Assistance;Modified Independent Bed Mobility: Supine to Sit     Supine to sit: Supervision;Modified independent (Device/Increase time)        Transfers Overall transfer level: Modified independent Equipment used: Rolling walker (2 wheeled) Transfers: Sit to/from Stand Sit to Stand: Supervision Stand pivot transfers: Supervision       General transfer comment: cues for hand placement    Ambulation/Gait Ambulation/Gait assistance: Supervision Ambulation Distance (Feet): 40 Feet Assistive device: Rolling walker (2 wheeled) Gait Pattern/deviations: Decreased step length - right;Decreased step length - left;Antalgic   Gait velocity interpretation: Below normal speed for age/gender General Gait Details: Pt amb into bathroom and had BM prior to ambulating 52ft in hallway with RW; slowed, antalgic gait with significantly decreased stride length   Stairs            Wheelchair Mobility    Modified Rankin (Stroke Patients Only)       Balance Overall balance assessment: Needs assistance Sitting-balance support: Feet supported;No upper extremity supported Sitting balance-Leahy Scale: Good     Standing balance support: Bilateral upper extremity supported;During functional activity Standing balance-Leahy Scale: Fair                              Cognition Arousal/Alertness: Awake/alert Behavior During Therapy: WFL for tasks assessed/performed Overall Cognitive Status: Within Functional Limits for tasks assessed                                        Exercises      General Comments        Pertinent Vitals/Pain Pain Assessment: 0-10 Pain Score: 6  Pain Location: stomach Pain Descriptors / Indicators: Cramping Pain Intervention(s): Limited activity within patient's tolerance;Monitored during session;Repositioned;RN gave pain meds during session  Home Living                      Prior Function            PT Goals (current goals can now be found in the care plan section) Acute Rehab PT Goals Patient Stated Goal: To walk by herself again PT Goal Formulation: With patient Time For Goal Achievement: 04/24/17 Potential to Achieve Goals: Good    Frequency    Min 4X/week      PT Plan      Co-evaluation              AM-PAC PT "6 Clicks" Daily Activity  Outcome Measure                    End of Session Equipment Utilized During Treatment: Gait belt Activity Tolerance: Patient tolerated treatment well Patient left: in chair Nurse Communication: Mobility status PT Visit Diagnosis: Unsteadiness on feet (R26.81);Other abnormalities of gait and mobility (R26.89);History of falling (Z91.81)     Time: 9842-1031 PT Time Calculation (min) (ACUTE ONLY): 28 min  Charges:  $Gait Training: 8-22 mins $Therapeutic Activity: 8-22 mins                    G Codes:          Geraldine Solar PT, DPT

## 2017-04-15 NOTE — Care Management Note (Signed)
Case Management Note  Patient Details  Name: PETE MERTEN MRN: [id number] Date of Birth: 1955/11/20  If discussed at Long Length of Stay Meetings, dates discussed:  04/15/2017  Additional Comments:  Sherald Barge, RN 04/15/2017, 12:15 PM

## 2017-04-16 DIAGNOSIS — F17200 Nicotine dependence, unspecified, uncomplicated: Secondary | ICD-10-CM

## 2017-04-16 DIAGNOSIS — I82491 Acute embolism and thrombosis of other specified deep vein of right lower extremity: Secondary | ICD-10-CM

## 2017-04-16 DIAGNOSIS — E0590 Thyrotoxicosis, unspecified without thyrotoxic crisis or storm: Secondary | ICD-10-CM

## 2017-04-16 DIAGNOSIS — E871 Hypo-osmolality and hyponatremia: Secondary | ICD-10-CM

## 2017-04-16 LAB — BASIC METABOLIC PANEL
ANION GAP: 10 (ref 5–15)
BUN: 8 mg/dL (ref 6–20)
CO2: 28 mmol/L (ref 22–32)
Calcium: 8.9 mg/dL (ref 8.9–10.3)
Chloride: 85 mmol/L — ABNORMAL LOW (ref 101–111)
Creatinine, Ser: 0.37 mg/dL — ABNORMAL LOW (ref 0.44–1.00)
GLUCOSE: 81 mg/dL (ref 65–99)
POTASSIUM: 3.9 mmol/L (ref 3.5–5.1)
Sodium: 123 mmol/L — ABNORMAL LOW (ref 135–145)

## 2017-04-16 LAB — HEMOGLOBIN AND HEMATOCRIT, BLOOD
HEMATOCRIT: 31.2 % — AB (ref 36.0–46.0)
Hemoglobin: 10.3 g/dL — ABNORMAL LOW (ref 12.0–15.0)

## 2017-04-16 MED ORDER — SODIUM CHLORIDE 1 G PO TABS
2.0000 g | ORAL_TABLET | Freq: Three times a day (TID) | ORAL | Status: DC
  Administered 2017-04-16 – 2017-04-17 (×4): 2 g via ORAL
  Filled 2017-04-16 (×4): qty 2

## 2017-04-16 NOTE — Progress Notes (Signed)
Physical Therapy Treatment Patient Details Name: Cassidy Smith MRN: [id number] DOB: [birth year] Today's Date: 04/16/2017    History of Present Illness Cassidy Smith is a 62 y.o. female  from [facility] taking xarelto daily for anticoagulation for DVT treatment presented to ED with a complaint of maroon rectal bleeding that has been present for about a week per patient but noticed by staff today and she was sent to ED for further evaluation. The patient has some weakness but otherwise no chest pain or SOB symptoms.  PT was admitted on 2/21 with a fx hip; recieved an ORIF on 2/22 and was discharged on 03/14/2017.    PT Comments    Patient demonstrates improvement for sitting up at bedside requiring less assistance, slightly increased endurance/distance for gait training, limited mostly due festinating like steps once fatigued with occasional stopping, continues to be fall risk and requested to go back to bed due to fatigue after having sat up in chair earlier with nursing staff assisting.  Patient will benefit from continued physical therapy in hospital and recommended venue below to increase strength, balance, endurance for safe ADLs and gait.    Follow Up Recommendations  SNF     Equipment Recommendations  None recommended by PT    Recommendations for Other Services       Precautions / Restrictions Precautions Precautions: Fall Restrictions Weight Bearing Restrictions: No    Mobility  Bed Mobility Overal bed mobility: Needs Assistance Bed Mobility: Supine to Sit;Sit to Supine     Supine to sit: Supervision Sit to supine: Supervision   General bed mobility comments: requires less assistance for getting out of/into bed  Transfers Overall transfer level: Needs assistance Equipment used: Rolling walker (2 wheeled) Transfers: Sit to/from [facility] Sit to Stand: Min guard Stand pivot transfers: Min guard          Ambulation/Gait Ambulation/Gait  assistance: Min guard Ambulation Distance (Feet): 45 Feet Assistive device: Rolling walker (2 wheeled) Gait Pattern/deviations: Decreased step length - right;Decreased step length - left;Decreased stride length;Festinating   Gait velocity interpretation: Below normal speed for age/gender General Gait Details: patient demonstrates short steps/stride length with festinating like gait, occasional stopping, required verbal cues to take longer steps with fair carryover, but once fatigued returns to taking short steps   Stairs            Wheelchair Mobility    Modified Rankin (Stroke Patients Only)       Balance Overall balance assessment: Needs assistance Sitting-balance support: Feet supported;No upper extremity supported Sitting balance-Leahy Scale: Good     Standing balance support: Bilateral upper extremity supported;During functional activity Standing balance-Leahy Scale: Fair Standing balance comment: with RW                            Cognition Arousal/Alertness: Awake/alert Behavior During Therapy: WFL for tasks assessed/performed Overall Cognitive Status: Within Functional Limits for tasks assessed                                        Exercises General Exercises - Lower Extremity Long Arc Quad: Seated;AROM;Strengthening;Both;10 reps Hip Flexion/Marching: Seated;AROM;Strengthening;Both;10 reps Toe Raises: Seated;AROM;Strengthening;Both;10 reps Heel Raises: Seated;AROM;Strengthening;10 reps;Both    General Comments        Pertinent Vitals/Pain Pain Assessment: No/denies pain    Home Living  Prior Function            PT Goals (current goals can now be found in the care plan section) Acute Rehab PT Goals Patient Stated Goal: To walk by herself again PT Goal Formulation: With patient Time For Goal Achievement: 04/24/17 Potential to Achieve Goals: Good Progress towards PT goals: Progressing  toward goals    Frequency    Min 4X/week      PT Plan Current plan remains appropriate    Co-evaluation              AM-PAC PT "6 Clicks" Daily Activity  Outcome Measure  Difficulty turning over in bed (including adjusting bedclothes, sheets and blankets)?: None Difficulty moving from lying on back to sitting on the side of the bed? : None Difficulty sitting down on and standing up from a chair with arms (e.g., wheelchair, bedside commode, etc,.)?: A Little Help needed moving to and from a bed to chair (including a wheelchair)?: A Little Help needed walking in hospital room?: A Little Help needed climbing 3-5 steps with a railing? : A Lot 6 Click Score: 19    End of Session Equipment Utilized During Treatment: Gait belt Activity Tolerance: Patient tolerated treatment well;Patient limited by fatigue Patient left: in bed;with call bell/phone within reach;with bed alarm set Nurse Communication: Mobility status PT Visit Diagnosis: Unsteadiness on feet (R26.81);Other abnormalities of gait and mobility (R26.89);Muscle weakness (generalized) (M62.81)     Time: 6440-3474 PT Time Calculation (min) (ACUTE ONLY): 26 min  Charges:  $Therapeutic Activity: 8-22 mins                    G Codes:       3:58 PM, 2017-04-21 Lonell Grandchild, MPT Physical Therapist with [facility] 336 [phone] office [phone] mobile phone

## 2017-04-16 NOTE — Progress Notes (Signed)
Cassidy Smith  MRN: [id number]  DOB/AGE: Jan 17, 1955 62 y.o.  Primary Care Physician:Hasanaj, Samul Dada, MD  Admit date: 04/03/2017  Chief Complaint:  Chief Complaint  Patient presents with  . Rectal Bleeding    S-Pt presented on  04/03/2017 with  Chief Complaint  Patient presents with  . Rectal Bleeding  .    Pt today feels better  Meds . benztropine  1 mg Oral BID  . demeclocycline  300 mg Oral Q12H  . divalproex  1,500 mg Oral QHS  . feeding supplement (ENSURE ENLIVE)  237 mL Oral BID BM  . ferrous sulfate  325 mg Oral Q breakfast  . furosemide  20 mg Oral Daily  . magnesium oxide  400 mg Oral BID  . methimazole  10 mg Oral Daily  . pantoprazole  40 mg Oral BID AC  . risperiDONE  4 mg Oral QHS  . rivaroxaban  20 mg Oral Q supper  . senna-docusate  1 tablet Oral BID  . sodium chloride flush  3 mL Intravenous Q12H  . sodium chloride  1 g Oral TID WC  . traZODone  200 mg Oral QHS  . vitamin B-12  1,000 mcg Oral Daily       Physical Exam: Vital signs in last 24 hours: Temp:  [97.5 F (36.4 C)-99 F (37.2 C)] 98.4 F (36.9 C) (04/03 0524) Pulse Rate:  [57-77] 57 (04/03 0524) Resp:  [18] 18 (04/02 1441) BP: (88-122)/(52-73) 122/73 (04/03 0524) SpO2:  [96 %-97 %] 97 % (04/03 0524) Weight change:  Last BM Date: 04/15/17  Intake/Output from previous day: 04/02 0701 - 04/03 0700 In: 600 [P.O.:600] Out: 1450 [Urine:1450] No intake/output data recorded.   Physical Exam: General- pt is awake,alert, oriented to time place and person Resp- No acute REsp distress, CTA B/L NO Rhonchi CVS- S1S2 regular ij rate and rhythm GIT- BS+, soft, NT, ND EXT- NO LE Edema, Cyanosis   Lab Results: CBC Recent Labs    04/15/17 0439 04/16/17 0458  HGB 10.7* 10.3*  HCT 32.2* 31.2*    BMET Recent Labs    04/15/17 0439 04/16/17 0458  NA 124* 123*  K 4.0 3.9  CL 87* 85*  CO2 29 28  GLUCOSE 88 81  BUN 9 8  CREATININE 0.41* 0.37*  CALCIUM 9.0 8.9    MICRO No  results found for this or any previous visit (from the past 240 hour(s)).    Lab Results  Component Value Date   CALCIUM 8.9 04/16/2017   PHOS 3.0 04/15/2017        Impression: 1)Hyponatremia     Euvolemic Hyponatremia   -SIADH     Data in favor      HIgh Urine osmolarity      High Urine Sodium      Low Uric acid      Normal GFR   Contributing factor     Poor solute intake     On PO Salt + Demeclocycline                   2)CVS- hemodynamically stable  3)Anemia HGb low but stable  4)GI-admitted with rectal bleed    Surgery and primary team following  5)Psych-Hx of Depression and Bipolar ds Primary MD following  6)Acid base Co2 at goal     Plan:  Will increase po salt to 2 grams po TID Will follow Sodium    BHUTANI,MANPREET S 04/16/2017, 9:46 AM

## 2017-04-16 NOTE — Progress Notes (Signed)
PROGRESS NOTE                                                                                                                                                                                                             Patient Demographics:    Cassidy Smith, is a 62 y.o. female, DOB - [birth year], JKD:[id number]  Admit date - 04/03/2017   Admitting Physician Murlean Iba, MD  Outpatient Primary MD for the patient is Neale Burly, MD  LOS - 13  Outpatient Specialists: None  Chief Complaint  Patient presents with  . Rectal Bleeding       Brief Narrative   62 year old female with history of DVT on chronic anticoagulation, protein calorie malnutrition, anxiety and depression who presented with painless rectal bleed of 1 week duration associated with generalized weakness. Patient underwent colonoscopy on 3/23 showing a large rectal polyp which was partially resected. Hospital course prolonged with worsened hyponatremia.    Subjective:  Afebrile, no chest pain, no nausea, no vomiting and in no acute distress.  Patient just feeling weak and deconditioned.  Continue to have low sodium.   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   -Both surgery and GI recommend that the adenoma was too high up in the rectum and unable to perform transanal excision at our facility.   -Recommend evaluation by surgery at [facility] where it can be excised with a combination of endoscopic/anoscopic instrumentation. -Scheduled patient for appointment to see Dr waters at [facility]  ([address], [place], 2nd floor) on 04/29/2017 9 am. -Xarelto  resumed on 3/27.   -No further rectal bleed and H&H has remained stable.  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   -Patient has had chronic/ recurrent hyponatremia with sodium level between 128-130.  Sodium level remains low despite Lasix and water restriction followed by starting demeclocycline and salt tablets.  -Nephrology on board.  Will follow recommendations -Currently increasing dose of sodium tablets  -Will follow electrolytes trend -CXR from 3/23 negative for any mass lesion in lung.  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   -Doppler lower extremity negative for DVT.  S -She was  diagnosed of right lower extremity DVT on 03/12/2017 following a right displaced hip fracture and should be on anticoagulation for at least 3 months. -Continue to follow hemoglobin intermittently as patient experiencing blood loss anemia and requiring chronic anticoagulation.  Acute blood loss anemia. -Stable.   -Continue iron supplement.  -Patient has not required transfusion. -Follow hemoglobin trend intermittently.  Anxiety disorder and bipolar depression -Continue Risperdal, Cogentin and Depakote.  (All these do not commonly cause hyponatremia) -Patient denies suicidal ideation or hallucinations. -Mood has remained stable.  Recent right hip and left radial fracture. -Right hip IM nail done on 03/07/2017.   -Also sustained right nondisplaced radial styloid fracture which was managed nonsurgically.  -Needs to return to SNF for physical rehabilitation.  Tobacco abuse -Smoking cessation counseling has been provided -At this moment follow renal recommendations nicotine patch has been discontinued (in the setting of ongoing hyponatremia).   -Patient considering to quit.  Hyperthyroidism -Continue Tapazole  Stage I pressure injury of the skin over right hip -Continue phone dressing and preventive measures.  Severe protein calorie malnutrition -Nutritional service has been consulted -We will follow recommendation for feeding  supplements.    Code Status : Full code  Family Communication  : None at bedside  Disposition Plan  : SNF once sodium level adequately improved.    Barriers For Discharge : Ongoing hyponatremia,  Consults  : Nephrology, GI  Procedures  : Colonoscopy  DVT Prophylaxis  : Xarelto  Lab Results  Component Value Date   PLT 210 04/12/2017    Antibiotics  :    Anti-infectives (From admission, onward)   Start     Dose/Rate Route Frequency Ordered Stop   04/14/17 1000  demeclocycline (DECLOMYCIN) tablet 300 mg     300 mg Oral Every 12 hours 04/14/17 0838     04/13/17 1000  demeclocycline (DECLOMYCIN) tablet 150 mg  Status:  Discontinued     150 mg Oral Every 12 hours 04/13/17 0942 04/14/17 0838        Objective:   Vitals:   04/15/17 1441 04/15/17 1959 04/16/17 0524 04/16/17 1313  BP: (!) 88/52 (!) 98/56 122/73 100/63  Pulse: 72 77 (!) 57 67  Resp: 18   18  Temp: 99 F (37.2 C) (!) 97.5 F (36.4 C) 98.4 F (36.9 C) (!) 97.4 F (36.3 C)  TempSrc: Oral Oral Oral Oral  SpO2: 97% 96% 97% 99%  Weight:      Height:        Wt Readings from Last 3 Encounters:  04/11/17 51.3 kg (113 lb 1.5 oz)  03/14/17 63.8 kg (140 lb 10.5 oz)  01/26/16 84.4 kg (186 lb)     Intake/Output Summary (Last 24 hours) at 04/16/2017 1859 Last data filed at 04/16/2017 1814 Gross per 24 hour  Intake 920 ml  Output 1500 ml  Net -580 ml     Physical Exam Gen: Afebrile, denies chest pain, no shortness of breath, no nausea vomiting.  Patient reported no further blood in her stools. HEENT: Moist mucous membranes, no erythema or exudates appreciated in her pharynx, poor dentition, no thrush.  No JVD.   Chest: Good air movement bilaterally, no wheezing, no crackles, no rhonchi.  No using accessory muscles.  Good oxygen saturation on room air.   CVS: RRR, no rubs, no gallops, no murmurs. GI: Soft, nontender, nondistended, positive bowel sounds. Musculoskeletal: No edema, no cyanosis or  clubbing. Skin: No open wounds, no rash, no petechiae. Neurologic exam: Cranial nerves grossly intact,  no focal deficits, able to follow commands appropriately; patient is alert, awake and oriented x3.    Data Review:    CBC Recent Labs  Lab 04/10/17 0640  04/12/17 0706 04/13/17 1749 04/14/17 0659 04/15/17 0439 04/16/17 0458  WBC 6.3  --  5.9  --   --   --   --   HGB 10.9*   < > 11.2* 10.4* 10.5* 10.7* 10.3*  HCT 33.6*   < > 34.1* 32.1* 31.8* 32.2* 31.2*  PLT 215  --  210  --   --   --   --   MCV 92.1  --  89.7  --   --   --   --   MCH 29.9  --  29.5  --   --   --   --   MCHC 32.4  --  32.8  --   --   --   --   RDW 18.2*  --  17.2*  --   --   --   --    < > = values in this interval not displayed.    Chemistries  Recent Labs  Lab 04/12/17 0706 04/13/17 0632 04/14/17 0659 04/15/17 0439 04/16/17 0458  NA 126* 125*  124* 124* 124* 123*  K 3.7 4.2  4.1 4.1 4.0 3.9  CL 88* 87*  86* 87* 87* 85*  CO2 28 27  30 29 29 28   GLUCOSE 101* 86  91 90 88 81  BUN 11 10  10 10 9 8   CREATININE 0.37* 0.42*  0.40* 0.37* 0.41* 0.37*  CALCIUM 9.1 9.2  9.1 9.2 9.0 8.9    Lab Results  Component Value Date   HGBA1C 5.4 05/13/2013   Inpatient Medications  Scheduled Meds: . benztropine  1 mg Oral BID  . demeclocycline  300 mg Oral Q12H  . divalproex  1,500 mg Oral QHS  . feeding supplement (ENSURE ENLIVE)  237 mL Oral BID BM  . ferrous sulfate  325 mg Oral Q breakfast  . furosemide  20 mg Oral Daily  . magnesium oxide  400 mg Oral BID  . methimazole  10 mg Oral Daily  . pantoprazole  40 mg Oral BID AC  . risperiDONE  4 mg Oral QHS  . rivaroxaban  20 mg Oral Q supper  . senna-docusate  1 tablet Oral BID  . sodium chloride flush  3 mL Intravenous Q12H  . sodium chloride  2 g Oral TID WC  . traZODone  200 mg Oral QHS  . vitamin B-12  1,000 mcg Oral Daily   Continuous Infusions: . sodium chloride     Radiology Reports Dg Chest 2 View  Result Date:  04/05/2017 CLINICAL DATA:  Hyponatremia and weight loss EXAM: CHEST - 2 VIEW COMPARISON:  03/06/2017 FINDINGS: Cardiac shadow is stable. The lungs are hyperinflated. Mild blunting of the costophrenic angle is noted on the left likely related to small effusion. No focal infiltrate is seen. No bony abnormality is noted. IMPRESSION: Small pleural effusion on the left. No other focal abnormality is seen. Electronically Signed   By: Inez Catalina M.D.   On: 04/05/2017 19:23   US Venous Img Lower Unilateral Right  Result Date: 04/09/2017 CLINICAL DATA:  Right thigh pain EXAM: RIGHT LOWER EXTREMITY VENOUS DUPLEX ULTRASOUND TECHNIQUE: Doppler venous assessment of the right lower extremity deep venous system was performed, including characterization of spectral flow, compressibility, and phasicity. COMPARISON:  None. FINDINGS: There is complete compressibility of the right  common femoral, femoral, and popliteal veins. Doppler analysis demonstrates respiratory phasicity and augmentation of flow with calf compression. No obvious superficial vein or calf vein thrombosis. IMPRESSION: No evidence of right lower extremity DVT. Electronically Signed   By: Marybelle Killings M.D.   On: 04/09/2017 15:34   Dg Hip Unilat With Pelvis 2-3 Views Right  Result Date: 03/28/2017 [facility] report Dictated by Dr. Aline Brochure Chief complaint 3 Pelvis AP and lateral right hip Short gamma nail fixes a intertrochanteric fracture with mild comminution of the greater trochanter Distal and proximal locking are in place and intact hardware looks good fracture is healing appropriately This is postop day 22    Time Spent: 25 minutes     Barton Dubois M.D on 04/16/2017 at 6:59 PM  Between 7am to 7pm - Pager - [phone]  After 7pm go to [email or web address] - password [facility]  [facility] -  Office  [phone]

## 2017-04-17 DIAGNOSIS — Z8659 Personal history of other mental and behavioral disorders: Secondary | ICD-10-CM

## 2017-04-17 DIAGNOSIS — E43 Unspecified severe protein-calorie malnutrition: Secondary | ICD-10-CM

## 2017-04-17 DIAGNOSIS — E222 Syndrome of inappropriate secretion of antidiuretic hormone: Secondary | ICD-10-CM

## 2017-04-17 DIAGNOSIS — L89211 Pressure ulcer of right hip, stage 1: Secondary | ICD-10-CM

## 2017-04-17 DIAGNOSIS — K922 Gastrointestinal hemorrhage, unspecified: Secondary | ICD-10-CM

## 2017-04-17 LAB — BASIC METABOLIC PANEL
Anion gap: 8 (ref 5–15)
BUN: 8 mg/dL (ref 6–20)
CALCIUM: 8.9 mg/dL (ref 8.9–10.3)
CO2: 31 mmol/L (ref 22–32)
Chloride: 91 mmol/L — ABNORMAL LOW (ref 101–111)
Creatinine, Ser: 0.39 mg/dL — ABNORMAL LOW (ref 0.44–1.00)
GFR calc non Af Amer: >60 mL/min (ref >60)
GLUCOSE: 106 mg/dL — AB (ref 65–99)
POTASSIUM: 4.2 mmol/L (ref 3.5–5.1)
Sodium: 130 mmol/L — ABNORMAL LOW (ref 135–145)

## 2017-04-17 MED ORDER — DEMECLOCYCLINE HCL 150 MG PO TABS: 300.0000 mg | ORAL_TABLET | Freq: Two times a day (BID) | ORAL | Status: DC

## 2017-04-17 MED ORDER — SODIUM CHLORIDE 1 G PO TABS: 2.0000 g | ORAL_TABLET | Freq: Three times a day (TID) | ORAL | Status: DC

## 2017-04-17 MED ORDER — FUROSEMIDE 20 MG PO TABS: 20.0000 mg | ORAL_TABLET | Freq: Every day | ORAL | Status: DC

## 2017-04-17 NOTE — Discharge Summary (Signed)
Physician Discharge Summary  Cassidy Smith ZOX:[id number] DOB: [birth year] DOA: 04/03/2017  PCP: Neale Burly, MD  Admit date: 04/03/2017 Discharge date: 04/17/2017  Time spent: 35 minutes  Recommendations for Outpatient Follow-up:  1. Repeat basic metabolic panel in 5-7 daysTreatment of her dysplastic to reassess electrolytes trend 2. Repeat CBC in 1 week to follow hemoglobin trend 3. Please be aware of upcoming follow-up visit at [facility] for final treatment rectal polyp. 4. Follow up with nephrology as schedule.  Discharge Diagnoses:  Principal Problem:   Rectal bleeding Active Problems:   Hyponatremia   History of depression   Hx of anxiety disorder   Orthostatic hypotension   Hypomagnesemia   Anticoagulated   Stool bloody   Current every day smoker   Hyperthyroidism   Pressure injury of skin   Protein-calorie malnutrition, severe   SIADH (syndrome of inappropriate ADH production) ([place])   Dysplastic rectal polyp   GI bleed   Discharge Condition: Stable and improved.  Patient discharged home with instruction to follow-up with nephrology service in 3 weeks; and also follow-up at [facility] with Dr. Morton Stall on 04/29/17.  Diet recommendation: Regular diet  Filed Weights   04/03/17 0953 04/03/17 1342 04/11/17 0503  Weight: 50.8 kg (112 lb) 51.2 kg (112 lb 14 oz) 51.3 kg (113 lb 1.5 oz)    History of present illness:  As per H&P written by Dr. Wynetta Emery on 04/03/17 62 y.o. female  from [place] daily for anticoagulation for DVT treatment presented to ED with a complaint of maroon rectal bleeding that has been present for about a week per patient but noticed by staff today and she was sent to ED for further evaluation. The patient has some weakness but otherwise no chest pain or SOB symptoms.  She has noticed the maroon stools however.    Cassidy Smith was recently hospitalized at [facility].  Cassidy Smith fell on2/21and fractured her hip. She had  surgery February 2/22 by Dr. Aline Brochure. She was hospitalized through3/1. Her hospital course was complicated by right lower extremity DVT for which she was placed on rivoraxaban.   The nursing home staff noted maroon stool this morning. Cassidy Smith states she has noticed this for several days. She feels weak this morning. She is not lightheaded, or syncopal.  States she has had colonoscopy in the past. Does not remember the results, or when this was performed.  ED Course: her hemoglobin is actually improved from when she was discharged last month.  She is hemoccult positive.  Her vitals are stable.  Her last dose of xarelto was yesterday.  She is being admitted for a GI bleed.    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   -Both surgery and GI recommend that the adenoma was too high up in the rectum and unable to perform transanal excision at our facility.   -Recommend evaluation by surgery at [facility] where it can be excised with a combination of endoscopic/anoscopic instrumentation. -Scheduled patient for appointment to see Dr Morton Stall at [facility]  ([address], [place], 2nd floor) on 04/29/2017 9 am. -Xarelto  resumed on 3/27.   -No further rectal bleed and H&H has remained stable.  Hyponatremia -Secondary to SIADH with persistent hyponatremia.   -Patient has had chronic/ recurrent hyponatremia with sodium level between 128-130. -Sodium level back to her baseline  -Nephrology recommended  to discharge her on sodium tablets and demeclocycline  -Will recommend BMET in 5-7 days to follow electrolytes trend -CXR from 3/23 negative for any mass lesion in lung.  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  -Doppler lower extremity negative for DVT. S -She was diagnosed of right lower  extremity DVT on 03/12/2017 following a right displaced hip fracture and should be on anticoagulation for at least 3 months. -Continue to follow hemoglobin intermittently as patient experiencing blood loss anemia and requiring chronic anticoagulation.  Acute blood loss anemia. -Stable.  -Continue iron supplement.  -Patient has not required transfusion. -Follow hemoglobin trend intermittently.  Anxiety disorder and bipolar depression -Continue Risperdal, Cogentin and Depakote.  (All these do not commonly cause hyponatremia) -Patient denies suicidal ideation or hallucinations. -Mood has remained stable.  Recent right hip and left radial fracture. -Right hip IM nail done on 03/07/2017.  -Also sustained right nondisplaced radial styloid fracture which was managed nonsurgically.  -Needs to return to SNF for physical rehabilitation.  Tobacco abuse -Smoking cessation counseling has been provided -At this moment follow renal recommendations nicotine patch has been discontinued (in the setting of ongoing hyponatremia).   -Patient considering to quit.  Hyperthyroidism -Continue Tapazole  Stage I pressure injury of the skin over right hip -Continue phone dressing and preventive measures.  Severe protein calorie malnutrition -Nutritional service has been consulted -Will follow recommendation for feeding supplements.  Procedures:  Colonoscopy   Consultations:  Renal service  GI  Discharge Exam: Vitals:   04/17/17 0515 04/17/17 1152  BP: 110/78   Pulse: 75   Resp:    Temp: 98.5 F (36.9 C)   SpO2: 97% 95%   Gen: Afebrile, denies chest pain, no shortness of breath, no nausea vomiting.  Patient reported no further blood in her stools. HEENT: Moist mucous membranes, no erythema or exudates appreciated in her pharynx, poor dentition, no thrush.  No JVD.   Chest: Good air movement bilaterally, no wheezing, no crackles, no rhonchi.  No using accessory muscles.  Good oxygen  saturation on room air.   CVS: RRR, no rubs, no gallops, no murmurs. GI: Soft, nontender, nondistended, positive bowel sounds. Musculoskeletal: No edema, no cyanosis or clubbing. Skin: No open wounds, no rash, no petechiae. Neurologic exam: Cranial nerves grossly intact, no focal deficits, able to follow commands appropriately; patient is alert, awake and oriented x3.  Discharge Instructions   Discharge Instructions    Discharge instructions   Complete by:  As directed    Medications as prescribed Follow-up with Dr. Lowanda Foster (nephrologist) in 3 weeks Follow-up as scheduled with Dr. Morton Stall at [facility] for further treatment of tubular adenoma. Maintain adequate hydration     Allergies as of 04/17/2017   No Known Allergies     Medication List    TAKE these medications   AMINO ACIDS-PROTEIN HYDROLYS PO Take 30 mEq by mouth 2 (two) times daily.   aspirin 81 MG EC tablet Take 1 tablet (81 mg total) by mouth daily.   benztropine 1 MG tablet Commonly known as:  COGENTIN Take 1 mg by mouth 2 (two) times daily.   bisacodyl 5 MG EC tablet Commonly known as:  DULCOLAX Take 1 tablet (5 mg total) by mouth daily as needed for moderate constipation.   CENTRUM SILVER ADULT 50+ PO Take 1 tablet by mouth every morning.   demeclocycline 150 MG tablet Commonly known as:  DECLOMYCIN Take 2 tablets (300 mg total) by mouth every 12 (twelve) hours.  diclofenac sodium 1 % Gel Commonly known as:  VOLTAREN Apply 2 g topically 4 (four) times daily.   divalproex 500 MG 24 hr tablet Commonly known as:  DEPAKOTE ER Take 1,500 mg by mouth at bedtime.   feeding supplement (ENSURE ENLIVE) Liqd Take 237 mLs by mouth 2 (two) times daily between meals. What changed:  Another medication with the same name was added. Make sure you understand how and when to take each.   feeding supplement (ENSURE ENLIVE) Liqd Take 237 mLs by mouth 2 (two) times daily between meals. What changed:  You were already  taking a medication with the same name, and this prescription was added. Make sure you understand how and when to take each.   ferrous sulfate 325 (65 FE) MG tablet Take 1 tablet (325 mg total) by mouth daily with breakfast.   furosemide 20 MG tablet Commonly known as:  LASIX Take 1 tablet (20 mg total) by mouth daily. Start taking on:  04/18/2017   methimazole 10 MG tablet Commonly known as:  TAPAZOLE Take 10 mg by mouth daily.   pantoprazole 40 MG tablet Commonly known as:  PROTONIX Take 1 tablet (40 mg total) by mouth 2 (two) times daily before a meal.   potassium chloride SA 20 MEQ tablet Commonly known as:  K-DUR,KLOR-CON Take 20 mEq by mouth daily.   RISPERDAL CONSTA 50 MG injection Generic drug:  risperiDONE microspheres Inject 50 mg into the muscle every 14 (fourteen) days.   risperidone 4 MG tablet Commonly known as:  RISPERDAL Take 4 mg by mouth at bedtime.   Rivaroxaban 15 MG Tabs tablet Commonly known as:  XARELTO Take 15 mg by mouth 2 (two) times daily with a meal. For 20 days starting 03/14/2017 at 2000.   senna-docusate 8.6-50 MG tablet Commonly known as:  Senokot-S Take 1 tablet by mouth 2 (two) times daily.   sodium chloride 1 g tablet Take 2 tablets (2 g total) by mouth 3 (three) times daily with meals.   traZODone 100 MG tablet Commonly known as:  DESYREL Take 200 mg by mouth at bedtime.   vitamin B-12 1000 MCG tablet Commonly known as:  CYANOCOBALAMIN Take 1,000 mcg by mouth daily.      No Known Allergies Follow-up Information    MD at SNF Follow up.        [place] surgery in 2 weeks Follow up in 2 week(s).        Fran Lowes, MD Follow up in 3 week(s).   Specialty:  Nephrology Contact information: [address] [place] [address] [phone]           The results of significant diagnostics from this hospitalization (including imaging, microbiology, ancillary and laboratory) are listed below for  reference.    Significant Diagnostic Studies: Dg Chest 2 View  Result Date: 04/05/2017 CLINICAL DATA:  Hyponatremia and weight loss EXAM: CHEST - 2 VIEW COMPARISON:  03/06/2017 FINDINGS: Cardiac shadow is stable. The lungs are hyperinflated. Mild blunting of the costophrenic angle is noted on the left likely related to small effusion. No focal infiltrate is seen. No bony abnormality is noted. IMPRESSION: Small pleural effusion on the left. No other focal abnormality is seen. Electronically Signed   By: Inez Catalina M.D.   On: 04/05/2017 19:23   US Venous Img Lower Unilateral Right  Result Date: 04/09/2017 CLINICAL DATA:  Right thigh pain EXAM: RIGHT LOWER EXTREMITY VENOUS DUPLEX ULTRASOUND TECHNIQUE: Doppler venous assessment of the right  lower extremity deep venous system was performed, including characterization of spectral flow, compressibility, and phasicity. COMPARISON:  None. FINDINGS: There is complete compressibility of the right common femoral, femoral, and popliteal veins. Doppler analysis demonstrates respiratory phasicity and augmentation of flow with calf compression. No obvious superficial vein or calf vein thrombosis. IMPRESSION: No evidence of right lower extremity DVT. Electronically Signed   By: Marybelle Killings M.D.   On: 04/09/2017 15:34   Dg Hip Unilat With Pelvis 2-3 Views Right  Result Date: 03/28/2017 [facility] report Dictated by Dr. Aline Brochure Chief complaint 3 Pelvis AP and lateral right hip Short gamma nail fixes a intertrochanteric fracture with mild comminution of the greater trochanter Distal and proximal locking are in place and intact hardware looks good fracture is healing appropriately This is postop day 22   Labs: Basic Metabolic Panel: Recent Labs  Lab 04/12/17 0706 04/13/17 0632 04/14/17 0659 04/15/17 0439 04/16/17 0458 04/17/17 0430  NA 126* 125*  124* 124* 124* 123* 130*  K 3.7 4.2  4.1 4.1 4.0 3.9 4.2  CL 88* 87*  86* 87* 87* 85*  91*  CO2 28 27  30 29 29 28 31   GLUCOSE 101* 86  91 90 88 81 106*  BUN 11 10  10 10 9 8 8   CREATININE 0.37* 0.42*  0.40* 0.37* 0.41* 0.37* 0.39*  CALCIUM 9.1 9.2  9.1 9.2 9.0 8.9 8.9  PHOS 2.8 3.0 3.1 3.0  --   --    Liver Function Tests: Recent Labs  Lab 04/12/17 0706 04/13/17 0632 04/14/17 0659 04/15/17 0439  ALBUMIN 2.5* 2.6* 2.5* 2.5*   CBC: Recent Labs  Lab 04/12/17 0706 04/13/17 8299 04/14/17 0659 04/15/17 0439 04/16/17 0458  WBC 5.9  --   --   --   --   HGB 11.2* 10.4* 10.5* 10.7* 10.3*  HCT 34.1* 32.1* 31.8* 32.2* 31.2*  MCV 89.7  --   --   --   --   PLT 210  --   --   --   --    CBG: Recent Labs  Lab 04/11/17 0712 04/12/17 0721 04/13/17 0729  GLUCAP 85 108* 88    Signed:  Barton Dubois MD.  [facility] 04/17/2017, 1:49 PM

## 2017-04-17 NOTE — Progress Notes (Signed)
Patient IV removed, tolerated well. Report given to Nurse Supervisor Arbie Cookey, at [facility].

## 2017-04-17 NOTE — Clinical Social Work Note (Signed)
Facility notified of patient's discharge and discharge clinicals sent.  LCSW left message advising CL [place] of discharge. Patient's brother did not answer the phone.    LCSW signing off.     Odester Nilson, Clydene Pugh, LCSW

## 2017-04-17 NOTE — Progress Notes (Signed)
Subjective: Interval History: Patient offers no complaints.  She is feeling good  Objective: Vital signs in last 24 hours: Temp:  [97.4 F (36.3 C)-98.5 F (36.9 C)] 98.5 F (36.9 C) (04/04 0515) Pulse Rate:  [67-75] 75 (04/04 0515) Resp:  [18-20] 20 (04/03 2109) BP: (100-119)/(63-78) 110/78 (04/04 0515) SpO2:  [92 %-99 %] 97 % (04/04 0515) Weight change:   Intake/Output from previous day: 04/03 0701 - 04/04 0700 In: 920 [P.O.:920] Out: 1750 [Urine:1750] Intake/Output this shift: No intake/output data recorded.  General appearance: alert, cooperative and no distress Resp: clear to auscultation bilaterally Cardio: regular rate and rhythm Extremities: No edema  Lab Results: Recent Labs    04/15/17 0439 04/16/17 0458  HGB 10.7* 10.3*  HCT 32.2* 31.2*   BMET:  Recent Labs    04/16/17 0458 04/17/17 0430  NA 123* 130*  K 3.9 4.2  CL 85* 91*  CO2 28 31  GLUCOSE 81 106*  BUN 8 8  CREATININE 0.37* 0.39*  CALCIUM 8.9 8.9   No results for input(s): PTH in the last 72 hours. Iron Studies: No results for input(s): IRON, TIBC, TRANSFERRIN, FERRITIN in the last 72 hours.  Studies/Results: No results found.  I have reviewed the patient's current medications.  Assessment/Plan: 1] hyponatremia: Possible SIADH.  Her sodium is 124 is stable..  Patient is started on demeclocycline and salt tablet.  her sodium has improved is 130. 2] history of hyperthyroidism 3] history of anxiety disorder/depression 4] history of GI bleeding.  Her hemoglobin is stable Plan: 1]  we will continue his present management  2] we will check renal panel in the morning 3] we will see patient in 3 weeks if she is going to be discharged. 3] possibly DC nicotine patch 4] if her sodium declines further we may try to use tolvaptan   LOS: 14 days   Pamala Hayman S 04/17/2017,10:38 AM

## 2017-04-28 ENCOUNTER — Telehealth: Payer: Self-pay | Admitting: Orthopedic Surgery

## 2017-04-28 ENCOUNTER — Ambulatory Visit: Payer: Self-pay | Admitting: Orthopedic Surgery

## 2017-04-28 NOTE — Telephone Encounter (Signed)
Lamount Cohen from [facility] called needing to reschedule patient's appointment for today. I have rescheduled her to 4/29, is this ok since it's a postop DOS 03/07/17(ORIF). If this needs to be scheduled sooner please advise.

## 2017-04-28 NOTE — Telephone Encounter (Signed)
This is fine. Keo for 05/12/17.

## 2017-05-12 ENCOUNTER — Ambulatory Visit (INDEPENDENT_AMBULATORY_CARE_PROVIDER_SITE_OTHER): Payer: Medicaid Other

## 2017-05-12 ENCOUNTER — Telehealth: Payer: Self-pay | Admitting: Radiology

## 2017-05-12 ENCOUNTER — Ambulatory Visit (INDEPENDENT_AMBULATORY_CARE_PROVIDER_SITE_OTHER): Payer: Self-pay | Admitting: Orthopedic Surgery

## 2017-05-12 ENCOUNTER — Other Ambulatory Visit: Payer: Self-pay

## 2017-05-12 ENCOUNTER — Encounter: Payer: Self-pay | Admitting: Orthopedic Surgery

## 2017-05-12 ENCOUNTER — Ambulatory Visit: Payer: Medicaid Other | Admitting: Gastroenterology

## 2017-05-12 ENCOUNTER — Encounter: Payer: Self-pay | Admitting: Gastroenterology

## 2017-05-12 ENCOUNTER — Telehealth: Payer: Self-pay

## 2017-05-12 VITALS — BP 110/72 | HR 74 | Temp 97.0°F | Ht 65.0 in | Wt 112.0 lb

## 2017-05-12 VITALS — BP 113/71 | HR 79 | Ht 65.0 in | Wt 111.0 lb

## 2017-05-12 DIAGNOSIS — Z8781 Personal history of (healed) traumatic fracture: Secondary | ICD-10-CM | POA: Diagnosis not present

## 2017-05-12 DIAGNOSIS — Z967 Presence of other bone and tendon implants: Secondary | ICD-10-CM | POA: Diagnosis not present

## 2017-05-12 DIAGNOSIS — K219 Gastro-esophageal reflux disease without esophagitis: Secondary | ICD-10-CM

## 2017-05-12 DIAGNOSIS — Z9889 Other specified postprocedural states: Secondary | ICD-10-CM

## 2017-05-12 DIAGNOSIS — K621 Rectal polyp: Secondary | ICD-10-CM | POA: Diagnosis not present

## 2017-05-12 DIAGNOSIS — D649 Anemia, unspecified: Secondary | ICD-10-CM

## 2017-05-12 NOTE — Assessment & Plan Note (Signed)
Complains of refractory heartburn, substernal chest discomfort after meals. Unclear what medications she is currently on. She will call with complete list today so we can make appropriate changes.

## 2017-05-12 NOTE — Progress Notes (Signed)
POSTOP VISIT  POD # 66//WEEK 9   BP 113/71   Pulse 79   Ht 5\' 5"  (1.651 m)   Wt 111 lb (50.3 kg)   BMI 18.47 kg/m   Encounter Diagnosis  Name Primary?  . S/P ORIF (open reduction internal fixation) fracture right hip IM nail 03/07/17 Yes   WB STATUS as tolerated  XRAYS today stable fixation right intertrochanteric with gamma nail  PLAN weight-bear as tolerated return in 2 months

## 2017-05-12 NOTE — Telephone Encounter (Signed)
Pt seen in our office this am. LSL requested to reschedule appt with Dr. Morton Stall (general surgery with [facility]) as she was taken to wrong location last time. Called [facility], she has already been rescheduled for 06/03/17 at 8:30am. Appt is scheduled at [facility] location [phone] [address] 2nd floor). Called pt's home number and spoke to her brother [name] (emergency contact). Gave him appt information, address, and phone number for appt with Dr. Morton Stall.

## 2017-05-12 NOTE — Assessment & Plan Note (Signed)
Repeat CBC in one month

## 2017-05-12 NOTE — Patient Instructions (Signed)
1. Please call back with complete list of your medications as I cannot make changes without knowing what you are taking. For your reflux and anemia I will need to know what you are taking before any adjustments can be done. 2. We will plan on updating your labs for anemia in one month.  3. We will reschedule your office visit with Dr. Morton Stall for your rectal polyp.

## 2017-05-12 NOTE — Telephone Encounter (Addendum)
I am trying to find out who is managing blood thinners for patient. I have called [facility] to find out what is going on with the Eliquis and the Xarelto, and asked who is following her for the blood clot. The pharmacy did not know since these are new meds from the nursing home. The pharmacy told me to call her brother [name], who is listed as her emergency contact.I called, and [name] was not sure who would see her for this, and states he asked the home nurse, and they were not sure either. I have advised [name] and the patient to follow up with primary care ASAP to make sure her blood clot is managed appropriately. There are meds of Xarelto and Eliquis in her boxes from the nursing home, and the Xarelto is empty, the Eliquis only has a few left.   I have also advised her cousin who provided Transportation for her today, and she states she will make sure she does follow up with primary care as soon as possible for the blood thinners.

## 2017-05-12 NOTE — Assessment & Plan Note (Signed)
Reschedule OV with Dr. Morton Stall for transanal excision.

## 2017-05-12 NOTE — Patient Instructions (Signed)
Follow up with your primary care ASAP to make sure your blood clot is managed appropriately. There are meds of Xarelto and Eliquis in your boxes from the nursing home, and the Xarelto is empty, the Eliquis only has a few left. You need to make sure someone is taking care of these important medications for you.   I spoke with your brother [name] also to make sure he is aware.

## 2017-05-12 NOTE — Progress Notes (Addendum)
Primary Care Physician: Neale Burly, MD  Primary Gastroenterologist:  Barney Drain, MD   Chief Complaint  Patient presents with  . Anemia  . Diarrhea    improved. has 1 formed/soft stool per day  . Gastroesophageal Reflux    c/o indigestion/heart burn    HPI: Cassidy Smith is a 62 y.o. female here for follow-up.  She was scheduled to go to see Dr. 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 Unfortunately she states she was taken to [facility] office instead of the [place] office.   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  Previous colonoscopy (March 23) with partial resection of large adenoma showing tubulovillous changes with high-grade glandular dysplasia.  Both GI and general surgery at [facility] felt that the polyp was too high in the rectum for the patient to undergo transanal excision at our facility.  Patient reports going home after completing rehab a couple of weeks ago. Patient did not have a list of her medications.  She reported that she got her medications from [facility] but they state they have not filled any for several months since she was in a rehab facility after she fractured her hip in February.  Medications listed below or from her discharge summary on 04/17/2017. Patient lives in an apartment upstairs from her brother.    BM much better. 1 soft stool daily. No further bleeding. She is having a lot of hip pain. She wasn't aware of her appointment today with Dr. Aline Brochure, scheduled for 240pm. She states she will keep the appointment.   Appetite better. Substernal chest pain for one month. Heartburn like. Worse with meals. Not sure if she is on acid reflux medications. She is not taking anything  over the counter. No abd pain. No dysphagia. No vomiting. Weight with documented stability over the past one month. Although down ?10-15 pounds from [place] to march. Weights during hospital fluctuated quite a bit therefore ?reliability.  Her Hgb has been stable at 10.3 since transfusion in latter Feb. Hgb had got down to 6.7.    Current Outpatient Medications  Medication Sig Dispense Refill  . aspirin EC 81 MG EC tablet Take 1 tablet (81 mg total) by mouth daily.    . AMINO ACIDS-PROTEIN HYDROLYS PO Take 30 mEq by mouth 2 (two) times daily.    . benztropine (COGENTIN) 1 MG tablet Take 1 mg by mouth 2 (two) times daily.    . bisacodyl (DULCOLAX) 5 MG EC tablet Take 1 tablet (5 mg total) by mouth daily as needed for moderate constipation. 30 tablet 0  . demeclocycline (DECLOMYCIN) 150 MG tablet Take 2 tablets (300 mg total) by mouth every 12 (twelve) hours.    . diclofenac sodium (VOLTAREN) 1 % GEL Apply 2 g topically 4 (four) times daily. 2 Tube 0  . divalproex (DEPAKOTE ER) 500 MG 24 hr tablet Take 1,500 mg by mouth at bedtime.     . feeding supplement, ENSURE ENLIVE, (ENSURE ENLIVE) LIQD Take 237 mLs by mouth 2 (two) times daily between meals. 237 mL 0  . feeding supplement, ENSURE ENLIVE, (ENSURE ENLIVE) LIQD Take 237 mLs by mouth 2 (two) times daily between meals. 237 mL 12  . ferrous sulfate 325 (65 FE) MG tablet Take 1 tablet (325 mg  total) by mouth daily with breakfast. 30 tablet 0  . furosemide (LASIX) 20 MG tablet Take 1 tablet (20 mg total) by mouth daily.    . methimazole (TAPAZOLE) 10 MG tablet Take 10 mg by mouth daily.   0  . Multiple Vitamins-Minerals (CENTRUM SILVER ADULT 50+ PO) Take 1 tablet by mouth every morning.    . pantoprazole (PROTONIX) 40 MG tablet Take 1 tablet (40 mg total) by mouth 2 (two) times daily before a meal. 60 tablet 0  . potassium chloride SA (K-DUR,KLOR-CON) 20 MEQ tablet Take 20 mEq by mouth daily.    Marland Kitchen RISPERDAL CONSTA 50 MG injection Inject 50 mg into the  muscle every 14 (fourteen) days.  0  . risperidone (RISPERDAL) 4 MG tablet Take 4 mg by mouth at bedtime.  0  . Rivaroxaban (XARELTO) 15 MG TABS tablet Take 15 mg by mouth 2 (two) times daily with a meal. For 20 days starting 03/14/2017 at 2000.    . senna-docusate (SENOKOT-S) 8.6-50 MG tablet Take 1 tablet by mouth 2 (two) times daily. 30 tablet 0  . sodium chloride 1 g tablet Take 2 tablets (2 g total) by mouth 3 (three) times daily with meals.    . traZODone (DESYREL) 100 MG tablet Take 200 mg by mouth at bedtime.    . vitamin B-12 (CYANOCOBALAMIN) 1000 MCG tablet Take 1,000 mcg by mouth daily.     No current facility-administered medications for this visit.     Allergies as of 05/12/2017  . (No Known Allergies)    ROS:  General: Negative for anorexia, weight loss, fever, chills, fatigue, weakness. ENT: Negative for hoarseness, difficulty swallowing , nasal congestion. CV: Negative for chest pain, angina, palpitations, dyspnea on exertion, peripheral edema.  Respiratory: Negative for dyspnea at rest, dyspnea on exertion, cough, sputum, wheezing.  GI: See history of present illness. GU:  Negative for dysuria, hematuria, urinary incontinence, urinary frequency, nocturnal urination.  Endo: Negative for unusual weight change.    Physical Examination:   BP 110/72   Pulse 74   Temp (!) 97 F (36.1 C) (Oral)   Ht 5\' 5"  (1.651 m)   Wt 112 lb (50.8 kg)   BMI 18.64 kg/m   General: Well-nourished, well-developed in no acute distress. Difficult historian. Eyes: No icterus. Mouth: Oropharyngeal mucosa moist and pink , no lesions erythema or exudate. Abdomen: Bowel sounds are normal, nontender, nondistended, no hepatosplenomegaly or masses, no abdominal bruits or hernia , no rebound or guarding.   Extremities: No lower extremity edema. No clubbing or deformities. Neuro: Alert and oriented x 4   Skin: Warm and dry, no jaundice.   Psych: Alert and cooperative, normal mood and  affect  Labs:  Lab Results  Component Value Date   WBC 5.9 04/12/2017   HGB 10.3 (L) 04/16/2017   HCT 31.2 (L) 04/16/2017   MCV 89.7 04/12/2017   PLT 210 04/12/2017   Lab Results  Component Value Date   CREATININE 0.39 (L) 04/17/2017   BUN 8 04/17/2017   NA 130 (L) 04/17/2017   K 4.2 04/17/2017   CL 91 (L) 04/17/2017   CO2 31 04/17/2017   Lab Results  Component Value Date   ALT 9 (L) 04/03/2017   AST 14 (L) 04/03/2017   ALKPHOS 211 (H) 04/03/2017   BILITOT 0.5 04/03/2017   Lab Results  Component Value Date   IRON 44 03/06/2017   TIBC 283 03/06/2017   FERRITIN 102 03/06/2017   Lab Results  Component  Value Date   VITAMINB12 2,438 (H) 03/06/2017   Lab Results  Component Value Date   FOLATE 23.0 03/06/2017      Imaging Studies: No results found.

## 2017-05-12 NOTE — Progress Notes (Signed)
CC'D TO PCP °

## 2017-05-13 ENCOUNTER — Telehealth: Payer: Self-pay | Admitting: Orthopedic Surgery

## 2017-05-13 ENCOUNTER — Other Ambulatory Visit: Payer: Self-pay | Admitting: Orthopedic Surgery

## 2017-05-13 MED ORDER — GABAPENTIN 100 MG PO CAPS: 100.0000 mg | ORAL_CAPSULE | Freq: Three times a day (TID) | ORAL | 2 refills | Status: AC

## 2017-05-13 NOTE — Telephone Encounter (Signed)
Cassidy Smith called this morning stating that she thought Dr. Aline Brochure was going to give her a prescription for Gabapentin for the nerve pain but this was not sent in for her to [facility]  Please let her know if he meant to send in prescription or not.  Thanks

## 2017-05-13 NOTE — Telephone Encounter (Signed)
DONE

## 2017-05-14 NOTE — Telephone Encounter (Signed)
Advised per Dr Renard Matter / expressed concern again about management of blood thinners, physical therapy will make sure nurse is aware. Gave the VO

## 2017-05-14 NOTE — Telephone Encounter (Signed)
Call from [facility], need verbal orders for home health/occupational therapy for 2 times a week x 1 week, then 1 time a week x 1 week. PH# [phone]

## 2017-06-01 IMAGING — DX DG CHEST 2V
2 series · 2 of 2 positions shown · non-contrast
Comparison: None.

CLINICAL DATA: Productive cough for 2 weeks

EXAM:
CHEST  2 VIEW

[chest pa]
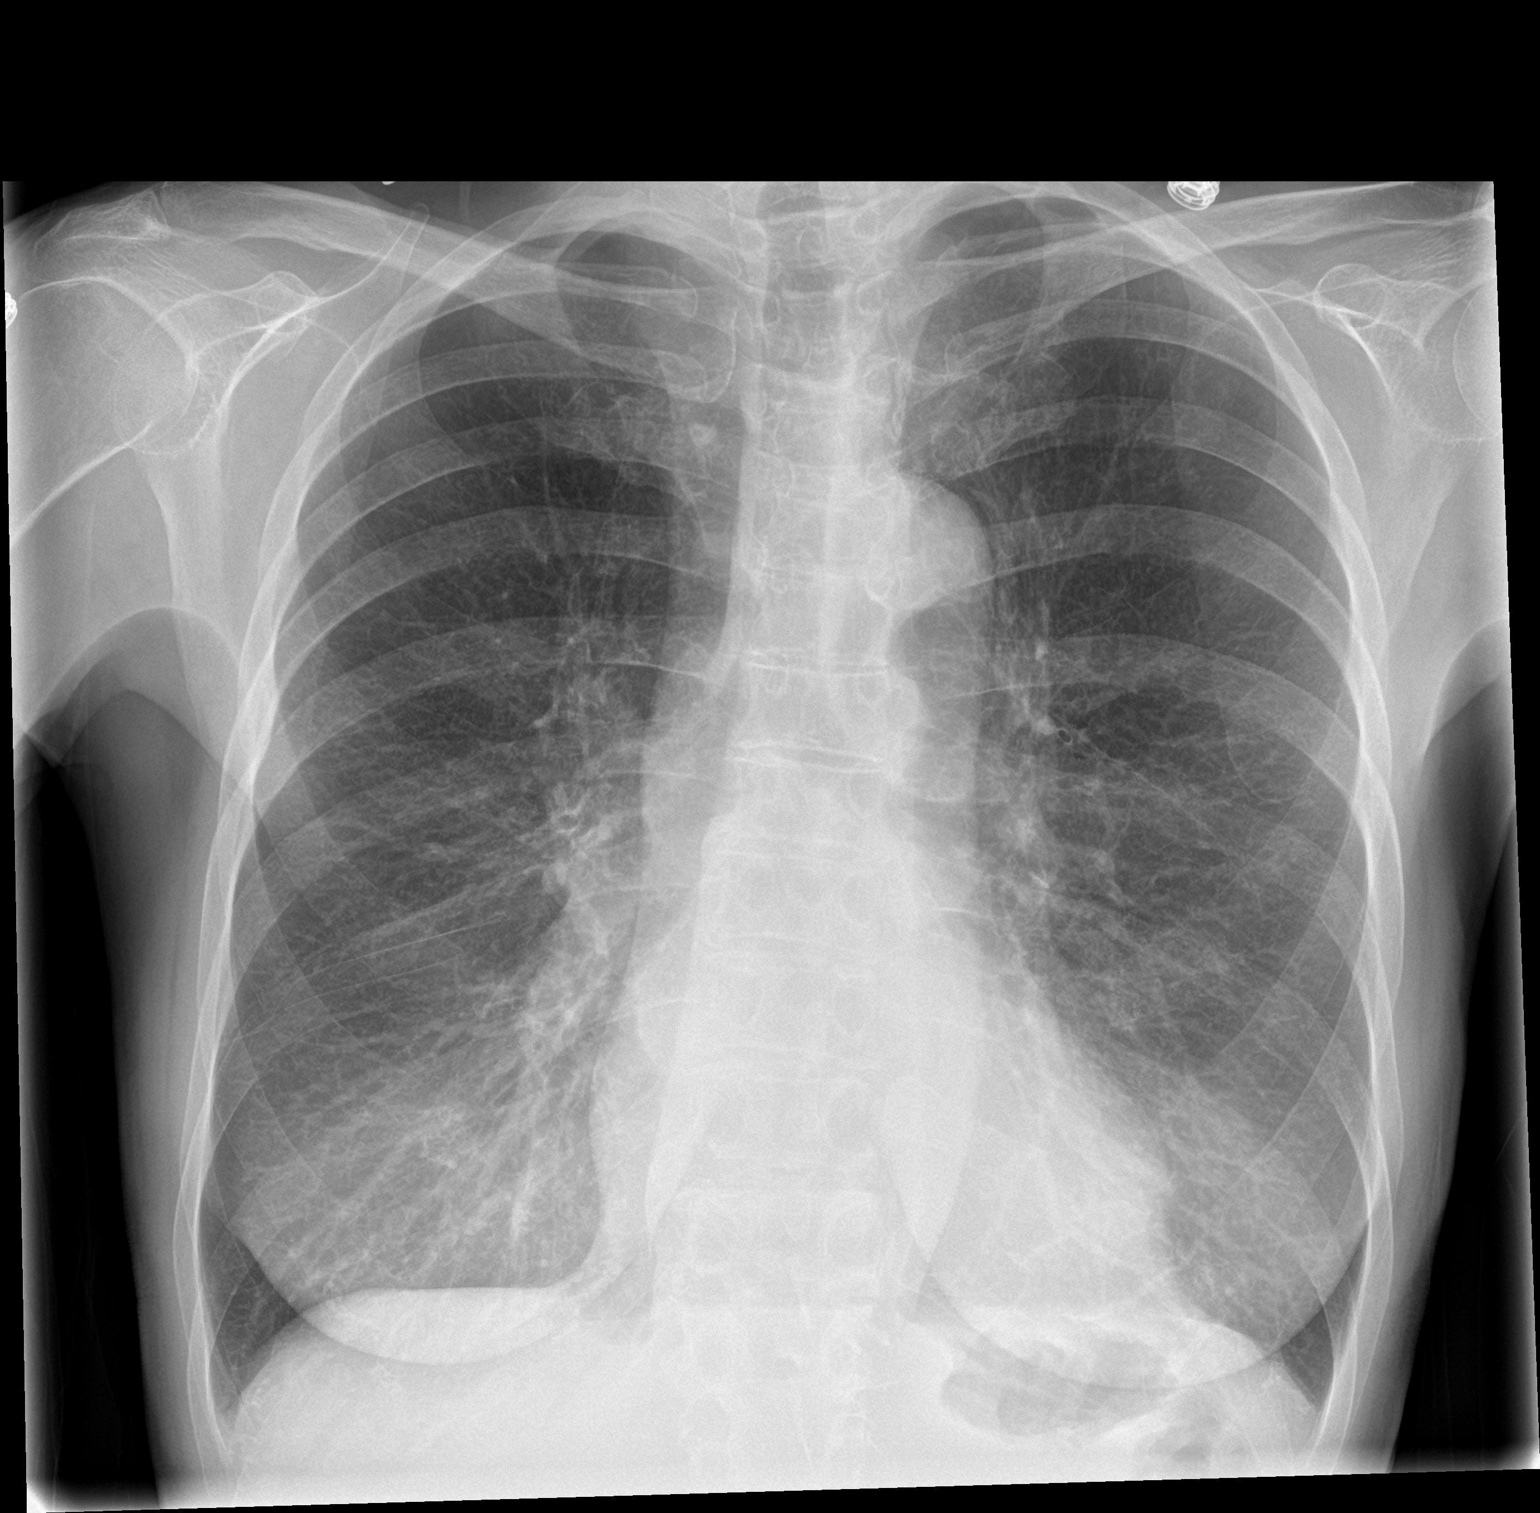

[chest lat]
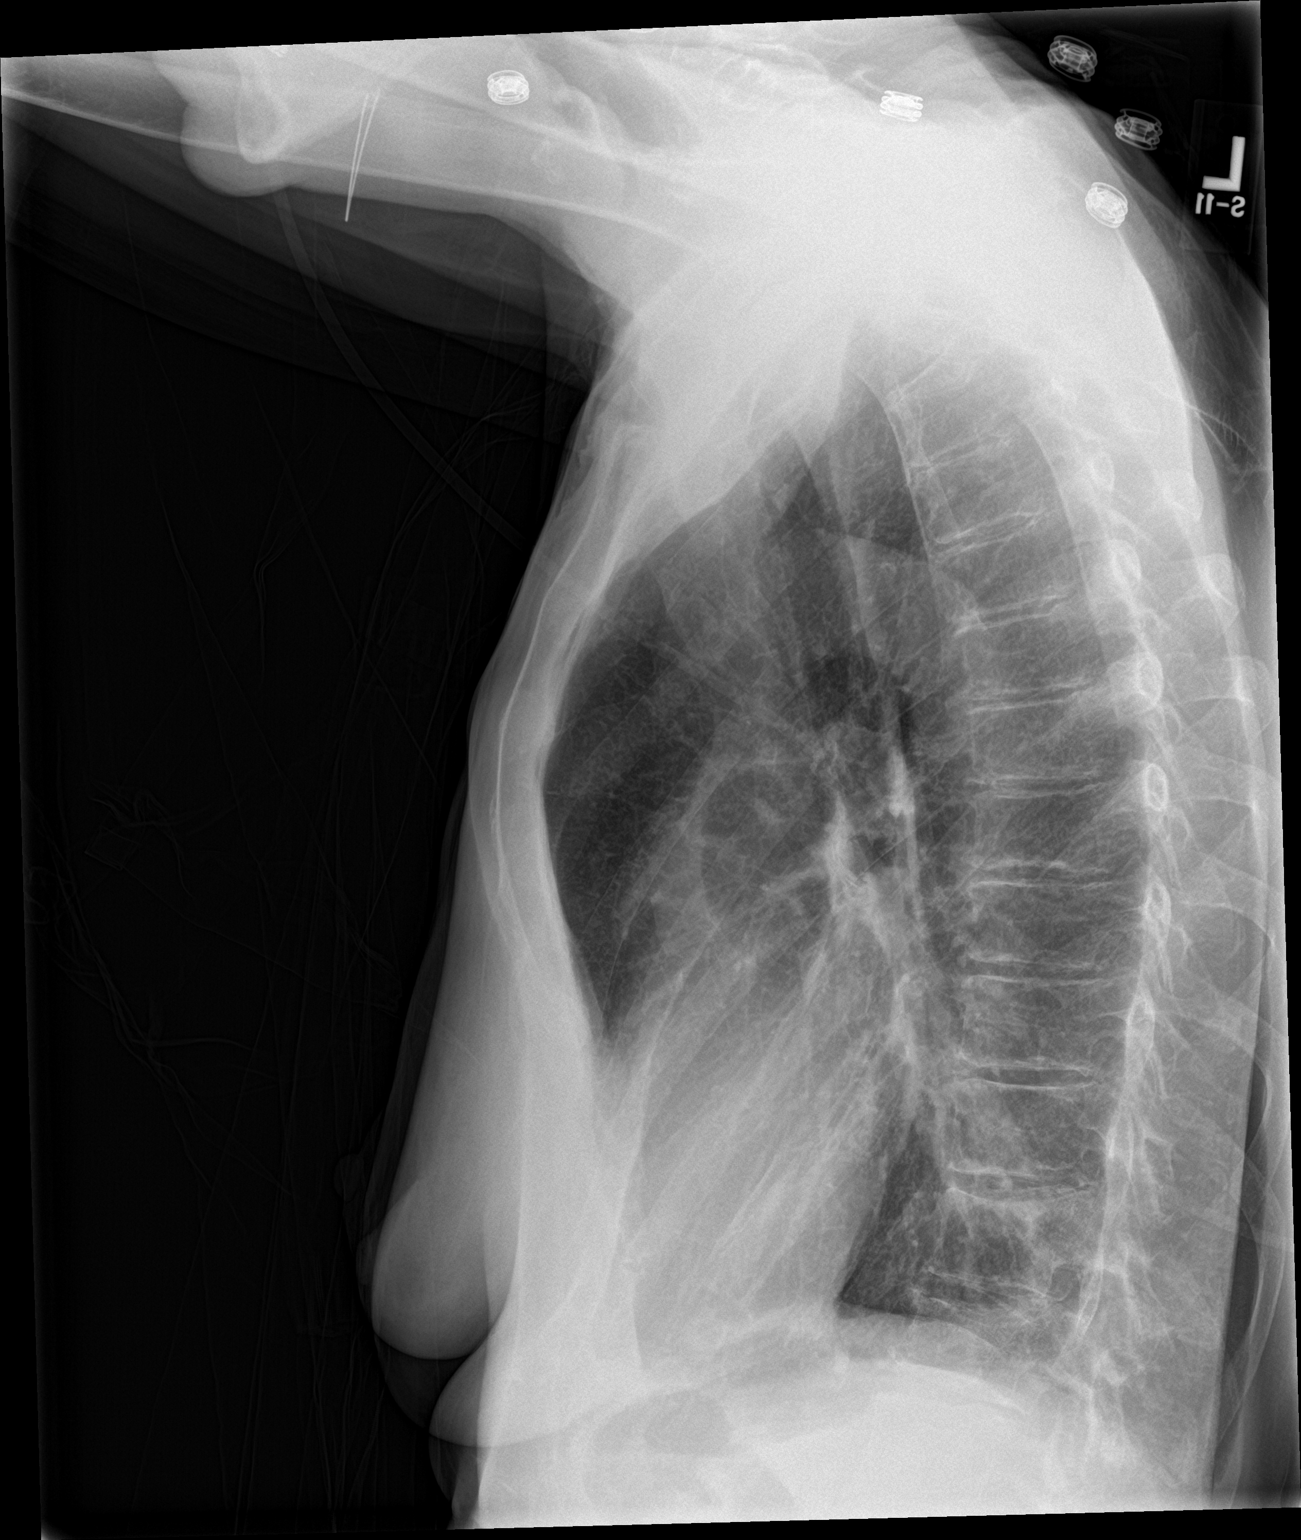

[2 of 2 positions shown; findings below may reference images not displayed]

FINDINGS: The lungs are hyperinflated likely secondary to COPD. There is no
focal parenchymal opacity. There is no pleural effusion or
pneumothorax. The heart and mediastinal contours are unremarkable.
The osseous structures are unremarkable.
IMPRESSION: No active cardiopulmonary disease.

## 2017-06-09 ENCOUNTER — Other Ambulatory Visit: Payer: Self-pay

## 2017-06-09 ENCOUNTER — Emergency Department (HOSPITAL_COMMUNITY): Payer: Medicaid Other

## 2017-06-09 ENCOUNTER — Inpatient Hospital Stay (HOSPITAL_COMMUNITY)
Admission: EM | Admit: 2017-06-09 | Discharge: 2017-06-11 | DRG: 064 | Disposition: A | Discharge status: Home Health | Payer: Medicaid Other | Attending: Internal Medicine | Admitting: Internal Medicine

## 2017-06-09 ENCOUNTER — Encounter (HOSPITAL_COMMUNITY): Payer: Self-pay | Admitting: Emergency Medicine

## 2017-06-09 DIAGNOSIS — Z87891 Personal history of nicotine dependence: Secondary | ICD-10-CM

## 2017-06-09 DIAGNOSIS — Z23 Encounter for immunization: Secondary | ICD-10-CM

## 2017-06-09 DIAGNOSIS — G92 Toxic encephalopathy: Secondary | ICD-10-CM | POA: Diagnosis present

## 2017-06-09 DIAGNOSIS — E0590 Thyrotoxicosis, unspecified without thyrotoxic crisis or storm: Secondary | ICD-10-CM | POA: Diagnosis present

## 2017-06-09 DIAGNOSIS — Z9181 History of falling: Secondary | ICD-10-CM

## 2017-06-09 DIAGNOSIS — R471 Dysarthria and anarthria: Secondary | ICD-10-CM | POA: Diagnosis present

## 2017-06-09 DIAGNOSIS — L8991 Pressure ulcer of unspecified site, stage 1: Secondary | ICD-10-CM | POA: Diagnosis present

## 2017-06-09 DIAGNOSIS — W19XXXA Unspecified fall, initial encounter: Secondary | ICD-10-CM | POA: Diagnosis not present

## 2017-06-09 DIAGNOSIS — K219 Gastro-esophageal reflux disease without esophagitis: Secondary | ICD-10-CM | POA: Diagnosis present

## 2017-06-09 DIAGNOSIS — E039 Hypothyroidism, unspecified: Secondary | ICD-10-CM | POA: Diagnosis present

## 2017-06-09 DIAGNOSIS — I639 Cerebral infarction, unspecified: Principal | ICD-10-CM | POA: Diagnosis present

## 2017-06-09 DIAGNOSIS — R4182 Altered mental status, unspecified: Secondary | ICD-10-CM | POA: Diagnosis not present

## 2017-06-09 DIAGNOSIS — D696 Thrombocytopenia, unspecified: Secondary | ICD-10-CM | POA: Diagnosis present

## 2017-06-09 DIAGNOSIS — E785 Hyperlipidemia, unspecified: Secondary | ICD-10-CM | POA: Diagnosis present

## 2017-06-09 DIAGNOSIS — R296 Repeated falls: Secondary | ICD-10-CM | POA: Diagnosis present

## 2017-06-09 DIAGNOSIS — Z7901 Long term (current) use of anticoagulants: Secondary | ICD-10-CM

## 2017-06-09 DIAGNOSIS — G20 Parkinson's disease: Secondary | ICD-10-CM | POA: Diagnosis present

## 2017-06-09 DIAGNOSIS — R2681 Unsteadiness on feet: Secondary | ICD-10-CM | POA: Diagnosis present

## 2017-06-09 DIAGNOSIS — Z803 Family history of malignant neoplasm of breast: Secondary | ICD-10-CM

## 2017-06-09 DIAGNOSIS — I739 Peripheral vascular disease, unspecified: Secondary | ICD-10-CM | POA: Diagnosis present

## 2017-06-09 DIAGNOSIS — Z79899 Other long term (current) drug therapy: Secondary | ICD-10-CM

## 2017-06-09 DIAGNOSIS — R9082 White matter disease, unspecified: Secondary | ICD-10-CM | POA: Diagnosis present

## 2017-06-09 DIAGNOSIS — R29701 NIHSS score 1: Secondary | ICD-10-CM | POA: Diagnosis present

## 2017-06-09 DIAGNOSIS — Y92009 Unspecified place in unspecified non-institutional (private) residence as the place of occurrence of the external cause: Secondary | ICD-10-CM

## 2017-06-09 DIAGNOSIS — S21211A Laceration without foreign body of right back wall of thorax without penetration into thoracic cavity, initial encounter: Secondary | ICD-10-CM

## 2017-06-09 DIAGNOSIS — T43505A Adverse effect of unspecified antipsychotics and neuroleptics, initial encounter: Secondary | ICD-10-CM | POA: Diagnosis present

## 2017-06-09 DIAGNOSIS — R55 Syncope and collapse: Secondary | ICD-10-CM

## 2017-06-09 DIAGNOSIS — E538 Deficiency of other specified B group vitamins: Secondary | ICD-10-CM | POA: Diagnosis present

## 2017-06-09 DIAGNOSIS — F419 Anxiety disorder, unspecified: Secondary | ICD-10-CM | POA: Diagnosis present

## 2017-06-09 DIAGNOSIS — Z811 Family history of alcohol abuse and dependence: Secondary | ICD-10-CM

## 2017-06-09 DIAGNOSIS — F319 Bipolar disorder, unspecified: Secondary | ICD-10-CM | POA: Diagnosis present

## 2017-06-09 DIAGNOSIS — Z86718 Personal history of other venous thrombosis and embolism: Secondary | ICD-10-CM

## 2017-06-09 DIAGNOSIS — E222 Syndrome of inappropriate secretion of antidiuretic hormone: Secondary | ICD-10-CM | POA: Diagnosis present

## 2017-06-09 DIAGNOSIS — W1830XA Fall on same level, unspecified, initial encounter: Secondary | ICD-10-CM | POA: Diagnosis present

## 2017-06-09 DIAGNOSIS — Z8719 Personal history of other diseases of the digestive system: Secondary | ICD-10-CM

## 2017-06-09 HISTORY — DX: Cerebral infarction, unspecified: I63.9

## 2017-06-09 LAB — COMPREHENSIVE METABOLIC PANEL
ALT: 10 U/L — ABNORMAL LOW (ref 14–54)
ANION GAP: 7 (ref 5–15)
AST: 21 U/L (ref 15–41)
Albumin: 3.1 g/dL — ABNORMAL LOW (ref 3.5–5.0)
Alkaline Phosphatase: 110 U/L (ref 38–126)
BILIRUBIN TOTAL: 0.5 mg/dL (ref 0.3–1.2)
BUN: 13 mg/dL (ref 6–20)
CHLORIDE: 96 mmol/L — AB (ref 101–111)
CO2: 30 mmol/L (ref 22–32)
Calcium: 9.5 mg/dL (ref 8.9–10.3)
Creatinine, Ser: 0.55 mg/dL (ref 0.44–1.00)
GFR calc Af Amer: >60 mL/min (ref >60)
Glucose, Bld: 117 mg/dL — ABNORMAL HIGH (ref 65–99)
POTASSIUM: 3.5 mmol/L (ref 3.5–5.1)
Sodium: 133 mmol/L — ABNORMAL LOW (ref 135–145)
TOTAL PROTEIN: 6.6 g/dL (ref 6.5–8.1)

## 2017-06-09 LAB — CBC WITH DIFFERENTIAL/PLATELET
BASOS PCT: 0 %
Basophils Absolute: 0 10*3/uL (ref 0.0–0.1)
EOS PCT: 1 %
Eosinophils Absolute: 0.1 10*3/uL (ref 0.0–0.7)
HCT: 37.6 % (ref 36.0–46.0)
Hemoglobin: 12.4 g/dL (ref 12.0–15.0)
LYMPHS PCT: 37 %
Lymphs Abs: 2.5 10*3/uL (ref 0.7–4.0)
MCH: 29.7 pg (ref 26.0–34.0)
MCHC: 33 g/dL (ref 30.0–36.0)
MCV: 90.2 fL (ref 78.0–100.0)
MONO ABS: 0.6 10*3/uL (ref 0.1–1.0)
MONOS PCT: 9 %
Neutro Abs: 3.6 10*3/uL (ref 1.7–7.7)
Neutrophils Relative %: 53 %
PLATELETS: 137 10*3/uL — AB (ref 150–400)
RBC: 4.17 MIL/uL (ref 3.87–5.11)
RDW: 14.4 % (ref 11.5–15.5)
WBC: 6.8 10*3/uL (ref 4.0–10.5)

## 2017-06-09 LAB — URINALYSIS, ROUTINE W REFLEX MICROSCOPIC
Bilirubin Urine: NEGATIVE
Glucose, UA: NEGATIVE mg/dL
Hgb urine dipstick: NEGATIVE
KETONES UR: NEGATIVE mg/dL
LEUKOCYTES UA: NEGATIVE
NITRITE: NEGATIVE
PH: 7 (ref 5.0–8.0)
Protein, ur: NEGATIVE mg/dL
SPECIFIC GRAVITY, URINE: 1.017 (ref 1.005–1.030)

## 2017-06-09 LAB — ETHANOL

## 2017-06-09 LAB — APTT: aPTT: 32 seconds (ref 24–36)

## 2017-06-09 LAB — TYPE AND SCREEN
ABO/RH(D): O POS
ANTIBODY SCREEN: NEGATIVE

## 2017-06-09 LAB — RAPID URINE DRUG SCREEN, HOSP PERFORMED
Amphetamines: NOT DETECTED
Barbiturates: NOT DETECTED
Benzodiazepines: NOT DETECTED
COCAINE: NOT DETECTED
OPIATES: NOT DETECTED
Tetrahydrocannabinol: NOT DETECTED

## 2017-06-09 LAB — PROTIME-INR
INR: 1.01
PROTHROMBIN TIME: 13.2 s (ref 11.4–15.2)

## 2017-06-09 LAB — TSH: TSH: 2.742 u[IU]/mL (ref 0.350–4.500)

## 2017-06-09 MED ORDER — DEMECLOCYCLINE HCL 150 MG PO TABS
300.0000 mg | ORAL_TABLET | Freq: Two times a day (BID) | ORAL | Status: DC
  Administered 2017-06-10 – 2017-06-11 (×3): 300 mg via ORAL
  Filled 2017-06-09 (×8): qty 2

## 2017-06-09 MED ORDER — SODIUM CHLORIDE 0.9 % IV BOLUS
1000.0000 mL | Freq: Once | INTRAVENOUS | Status: AC
  Administered 2017-06-09: 1000 mL via INTRAVENOUS

## 2017-06-09 MED ORDER — SODIUM CHLORIDE 0.9% FLUSH
3.0000 mL | Freq: Two times a day (BID) | INTRAVENOUS | Status: DC
  Administered 2017-06-09 – 2017-06-11 (×4): 3 mL via INTRAVENOUS

## 2017-06-09 MED ORDER — ACETAMINOPHEN 325 MG PO TABS
650.0000 mg | ORAL_TABLET | Freq: Four times a day (QID) | ORAL | Status: DC | PRN
  Administered 2017-06-09 – 2017-06-11 (×2): 650 mg via ORAL
  Filled 2017-06-09 (×2): qty 2

## 2017-06-09 MED ORDER — PANTOPRAZOLE SODIUM 40 MG PO TBEC
40.0000 mg | DELAYED_RELEASE_TABLET | Freq: Two times a day (BID) | ORAL | Status: DC
  Administered 2017-06-10 – 2017-06-11 (×3): 40 mg via ORAL
  Filled 2017-06-09 (×3): qty 1

## 2017-06-09 MED ORDER — SODIUM CHLORIDE 0.9% FLUSH: 3.0000 mL | INTRAVENOUS | Status: DC | PRN

## 2017-06-09 MED ORDER — ONDANSETRON HCL 4 MG PO TABS: 4.0000 mg | ORAL_TABLET | Freq: Four times a day (QID) | ORAL | Status: DC | PRN

## 2017-06-09 MED ORDER — SODIUM CHLORIDE 0.9 % IV SOLN: 250.0000 mL | INTRAVENOUS | Status: DC | PRN

## 2017-06-09 MED ORDER — SODIUM CHLORIDE 1 G PO TABS
2.0000 g | ORAL_TABLET | Freq: Three times a day (TID) | ORAL | Status: DC
  Administered 2017-06-10 – 2017-06-11 (×3): 2 g via ORAL
  Filled 2017-06-09 (×4): qty 2

## 2017-06-09 MED ORDER — METHIMAZOLE 5 MG PO TABS
10.0000 mg | ORAL_TABLET | Freq: Every day | ORAL | Status: DC
  Administered 2017-06-10 – 2017-06-11 (×2): 10 mg via ORAL
  Filled 2017-06-09: qty 2
  Filled 2017-06-09 (×3): qty 1
  Filled 2017-06-09: qty 2

## 2017-06-09 MED ORDER — APIXABAN 5 MG PO TABS
5.0000 mg | ORAL_TABLET | Freq: Two times a day (BID) | ORAL | Status: DC
  Administered 2017-06-09 – 2017-06-10 (×2): 5 mg via ORAL
  Filled 2017-06-09 (×2): qty 1

## 2017-06-09 MED ORDER — ASPIRIN EC 81 MG PO TBEC
81.0000 mg | DELAYED_RELEASE_TABLET | Freq: Every day | ORAL | Status: DC
  Administered 2017-06-09 – 2017-06-11 (×3): 81 mg via ORAL
  Filled 2017-06-09 (×3): qty 1

## 2017-06-09 MED ORDER — VITAMIN B-12 1000 MCG PO TABS
1000.0000 ug | ORAL_TABLET | Freq: Every day | ORAL | Status: DC
Start: 2017-06-09 — End: 2017-06-11
  Administered 2017-06-09 – 2017-06-11 (×3): 1000 ug via ORAL
  Filled 2017-06-09 (×3): qty 1

## 2017-06-09 MED ORDER — TETANUS-DIPHTH-ACELL PERTUSSIS 5-2.5-18.5 LF-MCG/0.5 IM SUSP
0.5000 mL | Freq: Once | INTRAMUSCULAR | Status: AC
  Administered 2017-06-09: 0.5 mL via INTRAMUSCULAR
  Filled 2017-06-09: qty 0.5

## 2017-06-09 MED ORDER — ONDANSETRON HCL 4 MG/2ML IJ SOLN: 4.0000 mg | Freq: Four times a day (QID) | INTRAMUSCULAR | Status: DC | PRN

## 2017-06-09 MED ORDER — POVIDONE-IODINE 10 % EX SOLN
CUTANEOUS | Status: AC
  Administered 2017-06-09: 15:00:00
  Filled 2017-06-09: qty 45

## 2017-06-09 MED ORDER — ACETAMINOPHEN 650 MG RE SUPP: 650.0000 mg | Freq: Four times a day (QID) | RECTAL | Status: DC | PRN

## 2017-06-09 MED ORDER — ADULT MULTIVITAMIN W/MINERALS CH
1.0000 | ORAL_TABLET | Freq: Every morning | ORAL | Status: DC
  Administered 2017-06-10 – 2017-06-11 (×2): 1 via ORAL
  Filled 2017-06-09 (×2): qty 1

## 2017-06-09 NOTE — ED Notes (Signed)
Large skin laceration to right side. Have applied a pressure dressing to area.

## 2017-06-09 NOTE — H&P (Addendum)
History and Physical    Cassidy Smith CBU:[id number] DOB: [birth year] DOA: 06/09/2017  PCP: Neale Burly, MD   Patient coming from: Home  Chief Complaint: Fall with laceration  HPI: Cassidy Smith is a 62 y.o. female with medical history significant for bipolar type I disorder, chronic SIADH, hypothyroidism, and GERD who is brought to the emergency department after a fall this morning where she struck her right thoracic back on the bedpost and sustained a laceration to her right rib cage.  She cannot accurately state whether or not she lost consciousness and what exactly caused her fall.  Family at the bedside state that they were quite concerned as the patient was noted to be quite lethargic with slurred speech and had significant gait abnormalities. Patient denies any headache, visual changes, trouble swallowing, or numbness and tingling in her extremities.   ED Course: Vital signs are currently stable.  Laboratory data demonstrates platelets of 137, sodium 133, and glucose 117.  Chest x-ray does not demonstrate any rib fractures or acute abnormalities.  CT of the head demonstrates atrophy, but no other acute abnormalities.  Review of Systems: Cannot be adequately obtained due to patient's condition.  However all other systems appear to be negative aside from above.  Past Medical History:  Diagnosis Date  . Anxiety   . Bipolar 1 disorder ([place])   . Chronic hip pain   . Chronic kidney disease    uti  currently,  hx bladder spasms  . Depression   . Headache(784.0)   . Hyperthyroidism   . Neuromuscular disorder (HCC)    shaking of hands   . SIADH (syndrome of inappropriate ADH production) ([place]) 04/08/2017    Past Surgical History:  Procedure Laterality Date  . COLONOSCOPY WITH PROPOFOL N/A 04/05/2017   Procedure: COLONOSCOPY WITH PROPOFOL;  Surgeon: Rogene Houston, MD;  Location: [facility];  Service: Endoscopy;  Laterality: N/A;  . INTRAMEDULLARY (IM) NAIL INTERTROCHANTERIC  Right 03/07/2017   Procedure: OPEN TREATMENT INTERNAL FIXATION RIGHT HIP WITH GAMA INTRAMEDULARY NAIL;  Surgeon: Carole Civil, MD;  Location: [facility];  Service: Orthopedics;  Laterality: Right;  . MULTIPLE EXTRACTIONS WITH ALVEOLOPLASTY N/A 10/30/2012   Procedure: MULTIPLE EXTRACION 5, 6, 8, 9, 10 ,18, 19, 31 WITH MAXILLARY RIGHT AND LEFT  ALVEOLOPLASTY REDUCE MAXILLARY LEFT TUBEROSITY;  Surgeon: Gae Bon, DDS;  Location: [facility];  Service: Oral Surgery;  Laterality: N/A;  . POLYPECTOMY  04/05/2017   Procedure: POLYPECTOMY;  Surgeon: Rogene Houston, MD;  Location: [facility];  Service: Endoscopy;;  recto-sigmoid, rectum     reports that she quit smoking about 5 weeks ago. Her smoking use included cigarettes. She has a 10.00 pack-year smoking history. She has never used smokeless tobacco. She reports that she does not drink alcohol or use drugs.  No Known Allergies  Family History  Problem Relation Age of Onset  . Breast cancer Mother   . Cancer - Other Mother   . Alcoholism Father   . Colon cancer Neg Hx   . Colon polyps Neg Hx     Prior to Admission medications   Medication Sig Start Date End Date Taking? Authorizing Provider  AMINO ACIDS-PROTEIN HYDROLYS PO Take 30 mEq by mouth 2 (two) times daily.   Yes [provider]  apixaban (ELIQUIS) 5 MG TABS tablet Take 5 mg by mouth 2 (two) times daily.   Yes [provider]  aspirin EC 81 MG EC tablet Take 1 tablet (81  mg total) by mouth daily. 05/15/13  Yes Samuella Cota, MD  benztropine (COGENTIN) 1 MG tablet Take 1 mg by mouth 2 (two) times daily.   Yes [provider]  divalproex (DEPAKOTE ER) 500 MG 24 hr tablet Take 1,500 mg by mouth at bedtime.    Yes [provider]  furosemide (LASIX) 20 MG tablet Take 1 tablet (20 mg total) by mouth daily. 04/18/17  Yes Barton Dubois, MD  gabapentin (NEURONTIN) 100 MG capsule Take 1 capsule (100 mg total) by mouth 3 (three) times daily. 05/13/17  Yes  Carole Civil, MD  methimazole (TAPAZOLE) 10 MG tablet Take 10 mg by mouth daily.  12/24/15  Yes [provider]  Multiple Vitamins-Minerals (CENTRUM SILVER ADULT 50+ PO) Take 1 tablet by mouth every morning.   Yes [provider]  pantoprazole (PROTONIX) 40 MG tablet Take 1 tablet (40 mg total) by mouth 2 (two) times daily before a meal. 03/14/17  Yes Hall, Carole N, DO  potassium chloride SA (K-DUR,KLOR-CON) 20 MEQ tablet Take 20 mEq by mouth daily.   Yes [provider]  RISPERDAL CONSTA 50 MG injection Inject 50 mg into the muscle every 14 (fourteen) days. 10/24/14  Yes [provider]  risperidone (RISPERDAL) 4 MG tablet Take 4 mg by mouth at bedtime. 06/22/15  Yes [provider]  sodium chloride 1 g tablet Take 2 tablets (2 g total) by mouth 3 (three) times daily with meals. 04/17/17  Yes Barton Dubois, MD  traZODone (DESYREL) 100 MG tablet Take 200 mg by mouth at bedtime.   Yes [provider]  vitamin B-12 (CYANOCOBALAMIN) 1000 MCG tablet Take 1,000 mcg by mouth daily.   Yes [provider]  demeclocycline (DECLOMYCIN) 150 MG tablet Take 2 tablets (300 mg total) by mouth every 12 (twelve) hours. Patient not taking: Reported on 05/12/2017 04/17/17   Barton Dubois, MD  diclofenac sodium (VOLTAREN) 1 % GEL Apply 2 g topically 4 (four) times daily. Patient not taking: Reported on 05/12/2017 03/14/17   Kayleen Memos, DO    Physical Exam: Vitals:   06/09/17 1400 06/09/17 1555 06/09/17 1842 06/09/17 1900  BP: 123/68 136/70 133/72 116/74  Pulse: 60 (!) 59 83 70  Resp:  (!) 21 16 (!) 21  Temp:      TempSrc:      SpO2: 100% 96% 96% 98%  Weight:      Height:        Constitutional: NAD, calm, comfortable Vitals:   06/09/17 1400 06/09/17 1555 06/09/17 1842 06/09/17 1900  BP: 123/68 136/70 133/72 116/74  Pulse: 60 (!) 59 83 70  Resp:  (!) 21 16 (!) 21  Temp:      TempSrc:      SpO2: 100% 96% 96% 98%  Weight:      Height:        Eyes: lids and conjunctivae normal ENMT: Mucous membranes are moist.  Neck: normal, supple Respiratory: clear to auscultation bilaterally. Normal respiratory effort. No accessory muscle use.  Cardiovascular: Regular rate and rhythm, no murmurs. No extremity edema. Abdomen: no tenderness, no distention. Bowel sounds positive.  Musculoskeletal:  No joint deformity upper and lower extremities; neurologic 5/5 motor strength bilaterally; no sensory deficits noted. Skin: no rashes, lesions, ulcers.   Labs on Admission: I have personally reviewed following labs and imaging studies  CBC: Recent Labs  Lab 06/09/17 1404  WBC 6.8  NEUTROABS 3.6  HGB 12.4  HCT 37.6  MCV 90.2  PLT 751*   Basic Metabolic Panel: Recent Labs  Lab 06/09/17 1404  NA 133*  K 3.5  CL 96*  CO2 30  GLUCOSE 117*  BUN 13  CREATININE 0.55  CALCIUM 9.5   GFR: Estimated Creatinine Clearance: 56 mL/min (by C-G formula based on SCr of 0.55 mg/dL). Liver Function Tests: Recent Labs  Lab 06/09/17 1404  AST 21  ALT 10*  ALKPHOS 110  BILITOT 0.5  PROT 6.6  ALBUMIN 3.1*   No results for input(s): LIPASE, AMYLASE in the last 168 hours. No results for input(s): AMMONIA in the last 168 hours. Coagulation Profile: Recent Labs  Lab 06/09/17 1404  INR 1.01   Cardiac Enzymes: No results for input(s): CKTOTAL, CKMB, CKMBINDEX, TROPONINI in the last 168 hours. BNP (last 3 results) No results for input(s): PROBNP in the last 8760 hours. HbA1C: No results for input(s): HGBA1C in the last 72 hours. CBG: No results for input(s): GLUCAP in the last 168 hours. Lipid Profile: No results for input(s): CHOL, HDL, LDLCALC, TRIG, CHOLHDL, LDLDIRECT in the last 72 hours. Thyroid Function Tests: No results for input(s): TSH, T4TOTAL, FREET4, T3FREE, THYROIDAB in the last 72 hours. Anemia Panel: No results for input(s): VITAMINB12, FOLATE, FERRITIN, TIBC, IRON, RETICCTPCT in the last 72 hours. Urine analysis:      Component Value Date/Time   COLORURINE YELLOW 06/09/2017 [facility] 06/09/2017 1404   LABSPEC 1.017 06/09/2017 1404   PHURINE 7.0 06/09/2017 1404   GLUCOSEU NEGATIVE 06/09/2017 1404   HGBUR NEGATIVE 06/09/2017 1404   BILIRUBINUR NEGATIVE 06/09/2017 1404   KETONESUR NEGATIVE 06/09/2017 1404   PROTEINUR NEGATIVE 06/09/2017 1404   UROBILINOGEN 0.2 05/12/2013 0210   NITRITE NEGATIVE 06/09/2017 1404   LEUKOCYTESUR NEGATIVE 06/09/2017 1404    Radiological Exams on Admission: Dg Ribs Unilateral W/chest Right  Result Date: 06/09/2017 CLINICAL DATA:  Status post fall with right-sided chest wall pain. EXAM: RIGHT RIBS AND CHEST - 3+ VIEW COMPARISON:  Chest radiograph 04/05/2017 FINDINGS: No fracture or other bone lesions are seen involving the ribs. There is no evidence of pneumothorax or pleural effusion. Both lungs are clear. Heart size and mediastinal contours are within normal limits. Calcific atherosclerotic disease and tortuosity of the aorta noted. IMPRESSION: No evidence of rib fractures. Electronically Signed   By: Fidela Salisbury M.D.   On: 06/09/2017 14:45   Ct Head Wo Contrast  Result Date: 06/09/2017 CLINICAL DATA:  Pain following fall EXAM: CT HEAD WITHOUT CONTRAST TECHNIQUE: Contiguous axial images were obtained from the base of the skull through the vertex without intravenous contrast. COMPARISON:  November 30, 2015 FINDINGS: Brain: Moderate diffuse atrophy is stable. There is no intracranial mass, hemorrhage, extra-axial fluid collection, or midline shift. There is small vessel disease in the centra semiovale bilaterally, somewhat more on the right than on the left. Small vessel disease is noted in the anterior limb right external capsule, stable. There is stable small vessel disease in the dentate nucleus of the cerebellum on the right, stable. There is a prior lacunar infarct in right cerebellum posterior fourth ventricle. There is no new gray-white compartment  lesion. No acute infarct evident. Vascular: No evident hyperdense vessel. There is calcification in each carotid siphon region. Skull: The bony calvarium appears intact. Sinuses/Orbits: There is mucosal thickening several ethmoid air cells bilaterally. Other paranasal sinuses are clear. There is leftward deviation of the nasal septum. Orbits appear symmetric bilaterally. Other: Mastoid air cells are clear. IMPRESSION: Atrophy with supratentorial and infratentorial small vessel  disease, stable. No acute infarct evident. No mass or hemorrhage. There are foci of arterial vascular calcification. There is mucosal thickening in several ethmoid air cells. There is deviation of the nasal septum. Electronically Signed   By: Lowella Grip III M.D.   On: 06/09/2017 14:49    EKG: Independently reviewed. 58bpm SR. Low voltage.  Assessment/Plan Principal Problem:   Fall Active Problems:   Bipolar 1 disorder (HCC)   Anticoagulated   Hyperthyroidism   SIADH (syndrome of inappropriate ADH production) (HCC)   GERD (gastroesophageal reflux disease)    1. Acute encephalopathy with fall and laceration.  Much of this appears to be secondary to possible inappropriate medication use and some toxic encephalopathy related to this.  However, due to speech deficits as well as trouble with ambulation that appear to be new, will obtain brain MRI for further evaluation of possible small CVA.  There appears to be no significant gross focal deficits on exam and therefore, I do not suspect LVO.  Patient also states that she did not lose consciousness, but again this is unclear and will also check carotid ultrasounds as well as 2D echocardiogram and monitor on telemetry.  SLP evaluation, but currently tolerating diet. PT/OT evaluation and fall precautions.  Continue with Eliquis for now as laceration appears stable with no oozing of blood. 2. R chest wall wound. Wound care monitoring. 3. Bipolar 1 disorder. Hold home medications  for now and restart in AM if condition appears improved. Continue neurochecks. 4. Mild hyponatremia secondary to SIADH.  This is a chronic problem for her and is related to her psychiatric medications.  Continue to monitor closely and maintain on some fluid restriction. Continue demeclocycline and oral sodium; hold Lasix for now. Monitor repeat labs. 5. Hyperthyroidism. Continue methimazole; check TSH and Free T4 levels. 6. History of DVT. Continue on Eliquis for now.  7. GERD. PPI.   DVT prophylaxis: Eliquis Code Status: Full Family Communication: Cousin at bedside Disposition Plan:Evaluate for reason behind fall, r/o syncope and CVA; PT eval Consults called:None Admission status: Obs, tele   Delmer Kowalski Darleen Crocker DO [facility] Pager [phone]  If 7PM-7AM, please contact night-coverage [email or web address] Password [facility]  06/09/2017, 8:39 PM

## 2017-06-09 NOTE — ED Notes (Signed)
Patient's gait from her room to the bathroom was extremely unsteady. Significant other reported that her gait and orientation are normally better than they are right now and that she is usually able to ambulate up and down a flight of stairs.

## 2017-06-09 NOTE — ED Notes (Signed)
Pt very lethargic upon assessment.

## 2017-06-09 NOTE — ED Provider Notes (Signed)
[facility] EMERGENCY DEPARTMENT Provider Note   CSN: [id number] Arrival date & time: 06/09/17  1248     History   Chief Complaint Chief Complaint  Patient presents with  . Fall  . Laceration    HPI Cassidy Smith is a 62 y.o. female.  HPI Patient is a poor historian.  Had underwent is fall from standing this morning.  Struck her right thoracic back on the bedpost and sustained a laceration.  Does not believe that she hit her head.  Has had mild bleeding from the laceration since.  Unknown last tetanus shot.  Family is at bedside.  States she is more lethargic with slurred speech than her baseline. Past Medical History:  Diagnosis Date  . Anxiety   . Bipolar 1 disorder ([place])   . Chronic hip pain   . Chronic kidney disease    uti  currently,  hx bladder spasms  . Depression   . Headache(784.0)   . Hyperthyroidism   . Neuromuscular disorder (HCC)    shaking of hands   . 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 GERD (gastroesophageal reflux disease) 05/12/2017  . GI bleed   . 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 Dysplastic rectal polyp   . Protein-calorie malnutrition, severe 04/07/2017  . Pressure injury of skin 04/04/2017  . Anticoagulated 04/03/2017  . Stool bloody 04/03/2017  . Current every day smoker 04/03/2017  . Rectal bleeding   . Chronic diarrhea   . Hyperthyroidism   . S/P ORIF (open reduction internal fixation) fracture right hip IM nail 03/07/17 03/24/2017  . Hypomagnesemia 03/07/2017  . Fall   . Closed intertrochanteric fracture of hip, right, initial encounter ([place]) 03/06/2017  . Radial styloid fracture: right 03/06/2017  . Anemia 03/06/2017  . Orthostatic hypotension 06/07/2015  . UTI (urinary tract infection) 06/07/2015  . Rhabdomyolysis 06/07/2015  . White matter abnormality on MRI of brain 05/14/2013  . History of depression  05/14/2013  . Hx of anxiety disorder 05/14/2013  . Bipolar I disorder, most recent episode (or current) manic, unspecified 05/14/2013  . Bipolar 1 disorder ([place]) 05/14/2013  . Hyponatremia 05/12/2013  . Bipolar disorder ([place]) 05/12/2013  . Lacunar infarct, acute ([place]) 05/12/2013    Past Surgical History:  Procedure Laterality Date  . COLONOSCOPY WITH PROPOFOL N/A 04/05/2017   Procedure: COLONOSCOPY WITH PROPOFOL;  Surgeon: Rogene Houston, MD;  Location: [facility] END[facility] SU[facility];  Service: Endoscopy;  Laterality: N/A;  . INTRAMEDULLARY (IM) NAIL INTERTROCHANTERIC Right 03/07/2017   Procedure: OPEN TREATMENT INTERNAL FIXATION RIGHT HIP WITH GAMA INTRAMEDULARY NAIL;  Surgeon: Carole Civil, MD;  Location: [facility] ORS;  Service: Orthopedics;  Laterality: Right;  . MULTIPLE EXTRACTIONS WITH ALVEOLOPLASTY N/A 10/30/2012   Procedure: MULTIPLE EXTRACION 5, 6, 8, 9, 10 ,18, 19, 31 WITH MAXILLARY RIGHT AND LEFT  ALVEOLOPLASTY REDUCE MAXILLARY LEFT TUBEROSITY;  Surgeon: Gae Bon, DDS;  Location: [place];  Service: Oral Surgery;  Laterality: N/A;  . POLYPECTOMY  04/05/2017   Procedure: POLYPECTOMY;  Surgeon: Rogene Houston, MD;  Location: [facility];  Service: Endoscopy;;  recto-sigmoid, rectum     OB History   None      Home Medications    Prior to Admission medications   Medication Sig Start Date End Date Taking? Authorizing Provider  AMINO ACIDS-PROTEIN HYDROLYS PO Take 30 mEq by mouth 2 (two) times daily.   Yes  [provider]  apixaban (ELIQUIS) 5 MG TABS tablet Take 5 mg by mouth 2 (two) times daily.   Yes [provider]  aspirin EC 81 MG EC tablet Take 1 tablet (81 mg total) by mouth daily. 05/15/13  Yes Samuella Cota, MD  benztropine (COGENTIN) 1 MG tablet Take 1 mg by mouth 2 (two) times daily.   Yes [provider]  divalproex (DEPAKOTE ER) 500 MG 24 hr tablet Take 1,500 mg by mouth at bedtime.    Yes [provider]  furosemide (LASIX) 20 MG  tablet Take 1 tablet (20 mg total) by mouth daily. 04/18/17  Yes Barton Dubois, MD  gabapentin (NEURONTIN) 100 MG capsule Take 1 capsule (100 mg total) by mouth 3 (three) times daily. 05/13/17  Yes Carole Civil, MD  methimazole (TAPAZOLE) 10 MG tablet Take 10 mg by mouth daily.  12/24/15  Yes [provider]  Multiple Vitamins-Minerals (CENTRUM SILVER ADULT 50+ PO) Take 1 tablet by mouth every morning.   Yes [provider]  pantoprazole (PROTONIX) 40 MG tablet Take 1 tablet (40 mg total) by mouth 2 (two) times daily before a meal. 03/14/17  Yes Hall, Carole N, DO  potassium chloride SA (K-DUR,KLOR-CON) 20 MEQ tablet Take 20 mEq by mouth daily.   Yes [provider]  RISPERDAL CONSTA 50 MG injection Inject 50 mg into the muscle every 14 (fourteen) days. 10/24/14  Yes [provider]  risperidone (RISPERDAL) 4 MG tablet Take 4 mg by mouth at bedtime. 06/22/15  Yes [provider]  sodium chloride 1 g tablet Take 2 tablets (2 g total) by mouth 3 (three) times daily with meals. 04/17/17  Yes Barton Dubois, MD  traZODone (DESYREL) 100 MG tablet Take 200 mg by mouth at bedtime.   Yes [provider]  vitamin B-12 (CYANOCOBALAMIN) 1000 MCG tablet Take 1,000 mcg by mouth daily.   Yes [provider]  demeclocycline (DECLOMYCIN) 150 MG tablet Take 2 tablets (300 mg total) by mouth every 12 (twelve) hours. Patient not taking: Reported on 05/12/2017 04/17/17   Barton Dubois, MD  diclofenac sodium (VOLTAREN) 1 % GEL Apply 2 g topically 4 (four) times daily. Patient not taking: Reported on 05/12/2017 03/14/17   Kayleen Memos, DO    Family History Family History  Problem Relation Age of Onset  . Breast cancer Mother   . Cancer - Other Mother   . Alcoholism Father   . Colon cancer Neg Hx   . Colon polyps Neg Hx     Social History Social History   Tobacco Use  . Smoking status: Former Smoker    Packs/day: 0.50    Years: 20.00    Pack  years: 10.00    Types: Cigarettes    Last attempt to quit: 05/05/2017    Years since quitting: 0.0  . Smokeless tobacco: Never Used  . Tobacco comment: patient states she quit one week ago  Substance Use Topics  . Alcohol use: No  . Drug use: No     Allergies   Patient has no known allergies.   Review of Systems Review of Systems  Unable to perform ROS: Mental status change     Physical Exam Updated Vital Signs BP 116/74   Pulse 70   Temp 98.2 F (36.8 C) (Oral)   Resp (!) 21   Ht 5\' 5"  (1.651 m)   Wt 49.9 kg (110 lb)   SpO2 98%   BMI 18.30 kg/m  Physical Exam  Constitutional: She appears well-developed and well-nourished. No distress.  HENT:  Head: Normocephalic and atraumatic.  Mouth/Throat: Oropharynx is clear and moist.  No obvious head injury.  No intraoral injury.  Eyes: Pupils are equal, round, and reactive to light. EOM are normal.  4 mm bilaterally and reactive.  Neck: Normal range of motion. Neck supple.  No posterior midline cervical tenderness to palpation.  No meningismus.  Cardiovascular: Normal rate and regular rhythm. Exam reveals no gallop and no friction rub.  No murmur heard. Pulmonary/Chest: Effort normal and breath sounds normal. No stridor. No respiratory distress. She has no wheezes. She has no rales. She exhibits no tenderness.  Abdominal: Soft. Bowel sounds are normal. There is no tenderness. There is no rebound and no guarding.  Musculoskeletal: Normal range of motion. She exhibits no edema or tenderness.  No lower extremity swelling, asymmetry or tenderness.  Distal pulses intact.  Lymphadenopathy:    She has no cervical adenopathy.  Neurological:  Mildly drowsy and slight slurring of words.  5/5 motor in all extremities.  Sensation intact.  Skin: Skin is warm. Capillary refill takes less than 2 seconds. No rash noted. She is not diaphoretic. No erythema.  Patient has a 6 cm linear laceration to the right thoracic back.  No obvious  contamination.  Bleeding from the site.  Nursing note and vitals reviewed.    ED Treatments / Results  Labs (all labs ordered are listed, but only abnormal results are displayed) Labs Reviewed  CBC WITH DIFFERENTIAL/PLATELET - Abnormal; Notable for the following components:      Result Value   Platelets 137 (*)    All other components within normal limits  COMPREHENSIVE METABOLIC PANEL - Abnormal; Notable for the following components:   Sodium 133 (*)    Chloride 96 (*)    Glucose, Bld 117 (*)    Albumin 3.1 (*)    ALT 10 (*)    All other components within normal limits  PROTIME-INR  ETHANOL  RAPID URINE DRUG SCREEN, HOSP PERFORMED  URINALYSIS, ROUTINE W REFLEX MICROSCOPIC  APTT  TYPE AND SCREEN    EKG None  Radiology Dg Ribs Unilateral W/chest Right  Result Date: 06/09/2017 CLINICAL DATA:  Status post fall with right-sided chest wall pain. EXAM: RIGHT RIBS AND CHEST - 3+ VIEW COMPARISON:  Chest radiograph 04/05/2017 FINDINGS: No fracture or other bone lesions are seen involving the ribs. There is no evidence of pneumothorax or pleural effusion. Both lungs are clear. Heart size and mediastinal contours are within normal limits. Calcific atherosclerotic disease and tortuosity of the aorta noted. IMPRESSION: No evidence of rib fractures. Electronically Signed   By: Fidela Salisbury M.D.   On: 06/09/2017 14:45   Ct Head Wo Contrast  Result Date: 06/09/2017 CLINICAL DATA:  Pain following fall EXAM: CT HEAD WITHOUT CONTRAST TECHNIQUE: Contiguous axial images were obtained from the base of the skull through the vertex without intravenous contrast. COMPARISON:  November 30, 2015 FINDINGS: Brain: Moderate diffuse atrophy is stable. There is no intracranial mass, hemorrhage, extra-axial fluid collection, or midline shift. There is small vessel disease in the centra semiovale bilaterally, somewhat more on the right than on the left. Small vessel disease is noted in the anterior limb  right external capsule, stable. There is stable small vessel disease in the dentate nucleus of the cerebellum on the right, stable. There is a prior lacunar infarct in right cerebellum posterior fourth ventricle. There is no new gray-white compartment lesion. No acute  infarct evident. Vascular: No evident hyperdense vessel. There is calcification in each carotid siphon region. Skull: The bony calvarium appears intact. Sinuses/Orbits: There is mucosal thickening several ethmoid air cells bilaterally. Other paranasal sinuses are clear. There is leftward deviation of the nasal septum. Orbits appear symmetric bilaterally. Other: Mastoid air cells are clear. IMPRESSION: Atrophy with supratentorial and infratentorial small vessel disease, stable. No acute infarct evident. No mass or hemorrhage. There are foci of arterial vascular calcification. There is mucosal thickening in several ethmoid air cells. There is deviation of the nasal septum. Electronically Signed   By: Lowella Grip III M.D.   On: 06/09/2017 14:49    Procedures .Marland KitchenLaceration Repair Date/Time: 06/09/2017 4:17 PM Performed by: Julianne Rice, MD Authorized by: Julianne Rice, MD   Consent:    Consent given by:  Guardian Laceration details:    Location:  Trunk   Trunk location:  Upper back   Length (cm):  6 Repair type:    Repair type:  Simple Exploration:    Contaminated: no   Treatment:    Irrigation solution:  Sterile saline   Visualized foreign bodies/material removed: no   Skin repair:    Repair method:  Staples   Number of staples:  6 Approximation:    Approximation:  Close Post-procedure details:    Dressing:  Antibiotic ointment   Patient tolerance of procedure:  Tolerated well, no immediate complications   (including critical care time)  Medications Ordered in ED Medications  sodium chloride 0.9 % bolus 1,000 mL (0 mLs Intravenous Stopped 06/09/17 1447)  povidone-iodine (BETADINE) 10 % external solution (   Given by Other 06/09/17 1447)  Tdap (BOOSTRIX) injection 0.5 mL (0.5 mLs Intramuscular Given 06/09/17 1413)     Initial Impression / Assessment and Plan / ED Course  I have reviewed the triage vital signs and the nursing notes.  Pertinent labs & imaging results that were available during my care of the patient were reviewed by me and considered in my medical decision making (see chart for details).     Laceration was closed in the emergency department given ongoing bleeding.  There is some concern for delayed presentation and increased risk of infection.  She may benefit from prophylactic antibiotics.  Next  CT head without acute findings.  No evidence of infection.  Patient slurred speech has improved but he is very unsteady with ambulation.  Sister states this is not her baseline.  Given multiple medications, suspect polypharmacy may be responsible for her alteration in mental status.  Discussed with hospitalist who will see patient in the emergency department and admit.  Final Clinical Impressions(s) / ED Diagnoses   Final diagnoses:  Fall, initial encounter  Laceration of right side of back, initial encounter  Altered mental status, unspecified altered mental status type    ED Discharge Orders    None       Julianne Rice, MD 06/09/17 2019

## 2017-06-09 NOTE — ED Triage Notes (Signed)
Patient states she fell this morning hitting her right side on the bed post. Patient has laceration noted to right side, bleeding at triage. Patient is on blood thinners.

## 2017-06-10 ENCOUNTER — Observation Stay (HOSPITAL_COMMUNITY): Payer: Medicaid Other

## 2017-06-10 DIAGNOSIS — Z86718 Personal history of other venous thrombosis and embolism: Secondary | ICD-10-CM | POA: Diagnosis not present

## 2017-06-10 DIAGNOSIS — R55 Syncope and collapse: Secondary | ICD-10-CM | POA: Diagnosis not present

## 2017-06-10 DIAGNOSIS — E039 Hypothyroidism, unspecified: Secondary | ICD-10-CM | POA: Diagnosis present

## 2017-06-10 DIAGNOSIS — R471 Dysarthria and anarthria: Secondary | ICD-10-CM | POA: Diagnosis present

## 2017-06-10 DIAGNOSIS — F419 Anxiety disorder, unspecified: Secondary | ICD-10-CM | POA: Diagnosis present

## 2017-06-10 DIAGNOSIS — G92 Toxic encephalopathy: Secondary | ICD-10-CM

## 2017-06-10 DIAGNOSIS — I639 Cerebral infarction, unspecified: Secondary | ICD-10-CM | POA: Diagnosis not present

## 2017-06-10 DIAGNOSIS — Z87891 Personal history of nicotine dependence: Secondary | ICD-10-CM | POA: Diagnosis not present

## 2017-06-10 DIAGNOSIS — R29701 NIHSS score 1: Secondary | ICD-10-CM | POA: Diagnosis present

## 2017-06-10 DIAGNOSIS — Z9181 History of falling: Secondary | ICD-10-CM | POA: Diagnosis not present

## 2017-06-10 DIAGNOSIS — F319 Bipolar disorder, unspecified: Secondary | ICD-10-CM | POA: Diagnosis present

## 2017-06-10 DIAGNOSIS — I739 Peripheral vascular disease, unspecified: Secondary | ICD-10-CM | POA: Diagnosis present

## 2017-06-10 DIAGNOSIS — Z7901 Long term (current) use of anticoagulants: Secondary | ICD-10-CM | POA: Diagnosis not present

## 2017-06-10 DIAGNOSIS — K219 Gastro-esophageal reflux disease without esophagitis: Secondary | ICD-10-CM | POA: Diagnosis present

## 2017-06-10 DIAGNOSIS — R2681 Unsteadiness on feet: Secondary | ICD-10-CM | POA: Diagnosis present

## 2017-06-10 DIAGNOSIS — R4182 Altered mental status, unspecified: Secondary | ICD-10-CM | POA: Diagnosis not present

## 2017-06-10 DIAGNOSIS — E785 Hyperlipidemia, unspecified: Secondary | ICD-10-CM | POA: Diagnosis present

## 2017-06-10 DIAGNOSIS — Z79899 Other long term (current) drug therapy: Secondary | ICD-10-CM | POA: Diagnosis not present

## 2017-06-10 DIAGNOSIS — L8991 Pressure ulcer of unspecified site, stage 1: Secondary | ICD-10-CM | POA: Diagnosis present

## 2017-06-10 DIAGNOSIS — Z23 Encounter for immunization: Secondary | ICD-10-CM | POA: Diagnosis not present

## 2017-06-10 DIAGNOSIS — E222 Syndrome of inappropriate secretion of antidiuretic hormone: Secondary | ICD-10-CM | POA: Diagnosis present

## 2017-06-10 DIAGNOSIS — W1830XA Fall on same level, unspecified, initial encounter: Secondary | ICD-10-CM | POA: Diagnosis present

## 2017-06-10 DIAGNOSIS — I361 Nonrheumatic tricuspid (valve) insufficiency: Secondary | ICD-10-CM | POA: Diagnosis not present

## 2017-06-10 DIAGNOSIS — Z811 Family history of alcohol abuse and dependence: Secondary | ICD-10-CM | POA: Diagnosis not present

## 2017-06-10 DIAGNOSIS — Y92009 Unspecified place in unspecified non-institutional (private) residence as the place of occurrence of the external cause: Secondary | ICD-10-CM | POA: Diagnosis not present

## 2017-06-10 DIAGNOSIS — W19XXXD Unspecified fall, subsequent encounter: Secondary | ICD-10-CM | POA: Diagnosis not present

## 2017-06-10 DIAGNOSIS — E538 Deficiency of other specified B group vitamins: Secondary | ICD-10-CM | POA: Diagnosis present

## 2017-06-10 DIAGNOSIS — E0590 Thyrotoxicosis, unspecified without thyrotoxic crisis or storm: Secondary | ICD-10-CM | POA: Diagnosis present

## 2017-06-10 DIAGNOSIS — D696 Thrombocytopenia, unspecified: Secondary | ICD-10-CM | POA: Diagnosis present

## 2017-06-10 DIAGNOSIS — Z803 Family history of malignant neoplasm of breast: Secondary | ICD-10-CM | POA: Diagnosis not present

## 2017-06-10 LAB — BASIC METABOLIC PANEL
ANION GAP: 5 (ref 5–15)
BUN: 15 mg/dL (ref 6–20)
CALCIUM: 9 mg/dL (ref 8.9–10.3)
CO2: 32 mmol/L (ref 22–32)
Chloride: 99 mmol/L — ABNORMAL LOW (ref 101–111)
Creatinine, Ser: 0.47 mg/dL (ref 0.44–1.00)
GFR calc non Af Amer: >60 mL/min (ref >60)
GLUCOSE: 85 mg/dL (ref 65–99)
POTASSIUM: 4 mmol/L (ref 3.5–5.1)
Sodium: 136 mmol/L (ref 135–145)

## 2017-06-10 LAB — HEMOGLOBIN A1C
HEMOGLOBIN A1C: 5 % (ref 4.8–5.6)
MEAN PLASMA GLUCOSE: 96.8 mg/dL

## 2017-06-10 LAB — CBC
HEMATOCRIT: 34.4 % — AB (ref 36.0–46.0)
HEMOGLOBIN: 11 g/dL — AB (ref 12.0–15.0)
MCH: 28.9 pg (ref 26.0–34.0)
MCHC: 32 g/dL (ref 30.0–36.0)
MCV: 90.5 fL (ref 78.0–100.0)
Platelets: 129 10*3/uL — ABNORMAL LOW (ref 150–400)
RBC: 3.8 MIL/uL — AB (ref 3.87–5.11)
RDW: 14.7 % (ref 11.5–15.5)
WBC: 5.5 10*3/uL (ref 4.0–10.5)

## 2017-06-10 LAB — LIPID PANEL
CHOL/HDL RATIO: 2.8 ratio
CHOLESTEROL: 136 mg/dL (ref 0–200)
HDL: 48 mg/dL (ref >40)
LDL Cholesterol: 76 mg/dL (ref 0–99)
Triglycerides: 61 mg/dL (ref <150)
VLDL: 12 mg/dL (ref 0–40)

## 2017-06-10 LAB — T4, FREE: FREE T4: 0.91 ng/dL (ref 0.82–1.77)

## 2017-06-10 MED ORDER — ENOXAPARIN SODIUM 40 MG/0.4ML ~~LOC~~ SOLN
40.0000 mg | SUBCUTANEOUS | Status: DC
  Administered 2017-06-11: 40 mg via SUBCUTANEOUS
  Filled 2017-06-10: qty 0.4

## 2017-06-10 MED ORDER — ATORVASTATIN CALCIUM 20 MG PO TABS
20.0000 mg | ORAL_TABLET | Freq: Every day | ORAL | Status: DC
  Administered 2017-06-10: 20 mg via ORAL
  Filled 2017-06-10: qty 1

## 2017-06-10 NOTE — Plan of Care (Signed)
  Problem: Acute Rehab OT Goals (only OT should resolve) Goal: Pt. Will Perform Grooming Flowsheets (Taken 06/10/2017 0844) Pt Will Perform Grooming: with min guard assist;standing Goal: Pt. Will Perform Upper Body Dressing Flowsheets (Taken 06/10/2017 0844) Pt Will Perform Upper Body Dressing: with modified independence;sitting Goal: Pt. Will Perform Lower Body Dressing Flowsheets (Taken 06/10/2017 0844) Pt Will Perform Lower Body Dressing: with supervision;sitting/lateral leans;sit to/from stand Goal: Pt. Will Perform Toileting-Clothing Manipulation Flowsheets (Taken 06/10/2017 0844) Pt Will Perform Toileting - Clothing Manipulation and hygiene: with min guard assist;sitting/lateral leans;sit to/from stand Goal: Pt/Caregiver Will Perform Home Exercise Program Flowsheets (Taken 06/10/2017 [phone]) Pt/caregiver will Perform Home Exercise Program: Increased strength;Both right and left upper extremity;Independently;With written HEP provided Goal: Pt. Will Transfer To Toilet Flowsheets (Taken 06/10/2017 0845) Pt Will Transfer to Toilet: with min assist;stand pivot transfer;ambulating;regular height toilet;bedside commode

## 2017-06-10 NOTE — Progress Notes (Signed)
PROGRESS NOTE                                                                                                                                                                                                             Patient Demographics:    Cassidy Smith, is a 62 y.o. female, DOB - 1955/08/08, XIP:[id number]  Admit date - 06/09/2017   Admitting Physician Pratik Darleen Crocker, DO  Outpatient Primary MD for the patient is Neale Burly, MD  LOS - 0  Outpatient Specialists: None  Chief Complaint  Patient presents with  . Fall  . Laceration       Brief Narrative 62 year old female with bipolar type I disorder, chronic SIADH, hyperthyroidism neuromuscular disorder with chronic tremors, hypothyroidism, on anticoagulation with Eliquis (for 3 months until 5/27) and GERD brought to the ED by her family after she fell at home and struck her right mid back on the bedpost and sustained a laceration in her right rib cage.  Patient not sure if she lost consciousness and what caused her to fall.  Family were concerned that patient was noted to be lethargic with slurred speech and had unsteady gait. In the ED vitals were stable.  Blood work showed mild thrombocytopenia and mild hyponatremia.  Head CT unremarkable. Patient admitted for suspected stroke.   Subjective:       Assessment  & Plan :   Principal problem Acute ischemic stroke Toxic encephalopathy MRI brain shows 6 mm nonhemorrhagic infarct in the posterior left coronary radiator.  Remote ischemic changes bilaterally.  Moderate medium and distal small vessel disease on MRA without stenosis. 2D echo pending.  LDL of 76 (goal <70).  Added statin.  Check A1c.  On baby aspirin.  (Will need to be on full dose aspirin or aspirin + Plavix). PT recommends SNF.  Further recommendations per neurology.  Acute toxic encephalopathy Possibly due to acute stroke.  Doubt polypharmacy.   Mental status is at baseline now.  Tobacco abuse Counseled strongly on cessation.  Continue nicotine patch.  History of DVT of lower extremity Patient was supposed to be on 3 months of anticoagulation until 5/27.  Discontinue Eliquis.  Stage I pressure injury of the skin over the right hip Dressing per nursing  Hyperthyroidism Continue Tapazole  B12 deficiency Continue supplement  History of SIADH Continue demeclocycline  Code Status : Full code  Family Communication  : Brother at bedside  Disposition Plan  : SNF per PT.  Brother wants to take her home with home health.  Can be discharged tomorrow if clinically stable and after neurology consult..  Barriers For Discharge : Active symptoms  Consults  : Neurology  Procedures  : CT head, MRI brain/MRA head, 2D echo (pending)  DVT Prophylaxis  :  Lovenox -   Lab Results  Component Value Date   PLT 129 (L) 06/10/2017    Antibiotics  :    Anti-infectives (From admission, onward)   Start     Dose/Rate Route Frequency Ordered Stop   06/09/17 2200  demeclocycline (DECLOMYCIN) tablet 300 mg     300 mg Oral Every 12 hours 06/09/17 2108          Objective:   Vitals:   06/09/17 2130 06/09/17 2230 06/10/17 0331 06/10/17 0815  BP: 125/70 132/71 129/65 (!) 141/71  Pulse: 70 67 71 62  Resp: 18 16 15 16   Temp:  97.7 F (36.5 C) 98.3 F (36.8 C) 98.1 F (36.7 C)  TempSrc:  Oral Oral Oral  SpO2: 96% 99% 93% 95%  Weight:  51 kg (112 lb 7 oz)    Height:  5\' 5"  (1.651 m)      Wt Readings from Last 3 Encounters:  06/09/17 51 kg (112 lb 7 oz)  05/12/17 50.3 kg (111 lb)  05/12/17 50.8 kg (112 lb)    No intake or output data in the 24 hours ending 06/10/17 1132   Physical Exam  Gen: not in distress HEENT: moist mucosa, supple neck Chest: clear b/l, no added sounds CVS: N S1&S2, no murmurs, rubs or gallop GI: soft, NT, ND, BS+ Musculoskeletal: warm, no edema CNS: Alert and oriented, nonfocal, fine resting  tremors    Data Review:    CBC Recent Labs  Lab 06/09/17 1404 06/10/17 0505  WBC 6.8 5.5  HGB 12.4 11.0*  HCT 37.6 34.4*  PLT 137* 129*  MCV 90.2 90.5  MCH 29.7 28.9  MCHC 33.0 32.0  RDW 14.4 14.7  LYMPHSABS 2.5  --   MONOABS 0.6  --   EOSABS 0.1  --   BASOSABS 0.0  --     Chemistries  Recent Labs  Lab 06/09/17 1404 06/10/17 0505  NA 133* 136  K 3.5 4.0  CL 96* 99*  CO2 30 32  GLUCOSE 117* 85  BUN 13 15  CREATININE 0.55 0.47  CALCIUM 9.5 9.0  AST 21  --   ALT 10*  --   ALKPHOS 110  --   BILITOT 0.5  --    ------------------------------------------------------------------------------------------------------------------ Recent Labs    06/10/17 0505  CHOL 136  HDL 48  LDLCALC 76  TRIG 61  CHOLHDL 2.8    Lab Results  Component Value Date   HGBA1C 5.4 05/13/2013   ------------------------------------------------------------------------------------------------------------------ Recent Labs    06/09/17 1404  TSH 2.742   ------------------------------------------------------------------------------------------------------------------ No results for input(s): VITAMINB12, FOLATE, FERRITIN, TIBC, IRON, RETICCTPCT in the last 72 hours.  Coagulation profile Recent Labs  Lab 06/09/17 1404  INR 1.01    No results for input(s): DDIMER in the last 72 hours.  Cardiac Enzymes No results for input(s): CKMB, TROPONINI, MYOGLOBIN in the last 168 hours.  Invalid input(s): CK ------------------------------------------------------------------------------------------------------------------ No results found for: BNP  Inpatient Medications  Scheduled Meds: . apixaban  5 mg Oral BID  . aspirin EC  81 mg Oral Daily  .  demeclocycline  300 mg Oral Q12H  . methimazole  10 mg Oral Daily  . multivitamin with minerals  1 tablet Oral q morning - 10a  . pantoprazole  40 mg Oral BID AC  . sodium chloride flush  3 mL Intravenous Q12H  . sodium chloride  2 g Oral  TID WC  . vitamin B-12  1,000 mcg Oral Daily   Continuous Infusions: . sodium chloride     PRN Meds:.sodium chloride, acetaminophen **OR** acetaminophen, ondansetron **OR** ondansetron (ZOFRAN) IV, sodium chloride flush  Micro Results No results found for this or any previous visit (from the past 240 hour(s)).  Radiology Reports Dg Ribs Unilateral W/chest Right  Result Date: 06/09/2017 CLINICAL DATA:  Status post fall with right-sided chest wall pain. EXAM: RIGHT RIBS AND CHEST - 3+ VIEW COMPARISON:  Chest radiograph 04/05/2017 FINDINGS: No fracture or other bone lesions are seen involving the ribs. There is no evidence of pneumothorax or pleural effusion. Both lungs are clear. Heart size and mediastinal contours are within normal limits. Calcific atherosclerotic disease and tortuosity of the aorta noted. IMPRESSION: No evidence of rib fractures. Electronically Signed   By: Fidela Salisbury M.D.   On: 06/09/2017 14:45   Ct Head Wo Contrast  Result Date: 06/09/2017 CLINICAL DATA:  Pain following fall EXAM: CT HEAD WITHOUT CONTRAST TECHNIQUE: Contiguous axial images were obtained from the base of the skull through the vertex without intravenous contrast. COMPARISON:  November 30, 2015 FINDINGS: Brain: Moderate diffuse atrophy is stable. There is no intracranial mass, hemorrhage, extra-axial fluid collection, or midline shift. There is small vessel disease in the centra semiovale bilaterally, somewhat more on the right than on the left. Small vessel disease is noted in the anterior limb right external capsule, stable. There is stable small vessel disease in the dentate nucleus of the cerebellum on the right, stable. There is a prior lacunar infarct in right cerebellum posterior fourth ventricle. There is no new gray-white compartment lesion. No acute infarct evident. Vascular: No evident hyperdense vessel. There is calcification in each carotid siphon region. Skull: The bony calvarium appears  intact. Sinuses/Orbits: There is mucosal thickening several ethmoid air cells bilaterally. Other paranasal sinuses are clear. There is leftward deviation of the nasal septum. Orbits appear symmetric bilaterally. Other: Mastoid air cells are clear. IMPRESSION: Atrophy with supratentorial and infratentorial small vessel disease, stable. No acute infarct evident. No mass or hemorrhage. There are foci of arterial vascular calcification. There is mucosal thickening in several ethmoid air cells. There is deviation of the nasal septum. Electronically Signed   By: Lowella Grip III M.D.   On: 06/09/2017 14:49   Mr Jodene Nam Head Wo Contrast  Result Date: 06/10/2017 CLINICAL DATA:  Neuro deficits, subacute.  Recent fall. EXAM: MRI HEAD WITHOUT CONTRAST MRA HEAD WITHOUT CONTRAST TECHNIQUE: Multiplanar, multiecho pulse sequences of the brain and surrounding structures were obtained without intravenous contrast. Angiographic images of the head were obtained using MRA technique without contrast. COMPARISON:  MRI brain 06/10/2015.  CT head 06/09/2017. FINDINGS: MRI HEAD FINDINGS Brain: A 6 mm nonhemorrhagic white matter infarct is present in the posterior left corona radiata. Associated T2 signal changes are present. Asymmetric T2 signal changes are present in the right lentiform nucleus and corona radiata. A remote lacunar infarct is again noted within the right cerebellum. The ventricles are proportionate to the degree of atrophy. No significant extra-axial fluid collections are present. The internal auditory canals are within normal limits. Vascular: Flow is present in the major intracranial  arteries. Skull and upper cervical spine: Skull base is within normal limits. There is some fluid in the nasopharynx. Craniocervical junction is otherwise normal. Sinuses/Orbits: Mild mucosal thickening is present within the ethmoid air cells bilaterally. Remaining paranasal sinuses and the mastoid air cells are clear. The globes and  orbits are within normal limits. MRA HEAD FINDINGS Internal carotid arteries are within normal limits from the high cervical segments through the ICA termini bilaterally. The A1 and M1 segments are normal. The left A1 segment is dominant. The anterior communicating artery is patent. MCA bifurcations are intact. There is some attenuation of distal MCA branch vessels bilaterally without a significant proximal stenosis or occlusion. The left vertebral artery is slightly dominant to the right. PICA origins are visualized and normal. The basilar artery is normal. The left posterior cerebral artery emanates from the basilar tip. The right posterior cerebral artery is of fetal type. There is moderate attenuation of distal PCA branches without a significant proximal stenosis or occlusion. IMPRESSION: 1. 6 mm nonhemorrhagic infarct in the posterior left corona radiata adjacent to the lateral ventricle. 2. Remote ischemic changes of the basal ganglia and corona radiata are otherwise asymmetric on the right. 3. Mild generalized atrophy. 4. Remote lacunar infarct of the right cerebellum. 5. MRA demonstrates moderate medium and distal small vessel disease without significant proximal stenosis, aneurysm, or branch vessel occlusion. Electronically Signed   By: San Morelle M.D.   On: 06/10/2017 09:22   Mr Brain Wo Contrast  Result Date: 06/10/2017 CLINICAL DATA:  Neuro deficits, subacute.  Recent fall. EXAM: MRI HEAD WITHOUT CONTRAST MRA HEAD WITHOUT CONTRAST TECHNIQUE: Multiplanar, multiecho pulse sequences of the brain and surrounding structures were obtained without intravenous contrast. Angiographic images of the head were obtained using MRA technique without contrast. COMPARISON:  MRI brain 06/10/2015.  CT head 06/09/2017. FINDINGS: MRI HEAD FINDINGS Brain: A 6 mm nonhemorrhagic white matter infarct is present in the posterior left corona radiata. Associated T2 signal changes are present. Asymmetric T2 signal  changes are present in the right lentiform nucleus and corona radiata. A remote lacunar infarct is again noted within the right cerebellum. The ventricles are proportionate to the degree of atrophy. No significant extra-axial fluid collections are present. The internal auditory canals are within normal limits. Vascular: Flow is present in the major intracranial arteries. Skull and upper cervical spine: Skull base is within normal limits. There is some fluid in the nasopharynx. Craniocervical junction is otherwise normal. Sinuses/Orbits: Mild mucosal thickening is present within the ethmoid air cells bilaterally. Remaining paranasal sinuses and the mastoid air cells are clear. The globes and orbits are within normal limits. MRA HEAD FINDINGS Internal carotid arteries are within normal limits from the high cervical segments through the ICA termini bilaterally. The A1 and M1 segments are normal. The left A1 segment is dominant. The anterior communicating artery is patent. MCA bifurcations are intact. There is some attenuation of distal MCA branch vessels bilaterally without a significant proximal stenosis or occlusion. The left vertebral artery is slightly dominant to the right. PICA origins are visualized and normal. The basilar artery is normal. The left posterior cerebral artery emanates from the basilar tip. The right posterior cerebral artery is of fetal type. There is moderate attenuation of distal PCA branches without a significant proximal stenosis or occlusion. IMPRESSION: 1. 6 mm nonhemorrhagic infarct in the posterior left corona radiata adjacent to the lateral ventricle. 2. Remote ischemic changes of the basal ganglia and corona radiata are otherwise asymmetric on the  right. 3. Mild generalized atrophy. 4. Remote lacunar infarct of the right cerebellum. 5. MRA demonstrates moderate medium and distal small vessel disease without significant proximal stenosis, aneurysm, or branch vessel occlusion.  Electronically Signed   By: San Morelle M.D.   On: 06/10/2017 09:22   US Carotid Bilateral  Result Date: 06/10/2017 CLINICAL DATA:  Syncope EXAM: BILATERAL CAROTID DUPLEX ULTRASOUND TECHNIQUE: Pearline Cables scale imaging, color Doppler and duplex ultrasound were performed of bilateral carotid and vertebral arteries in the neck. COMPARISON:  None. FINDINGS: Criteria: Quantification of carotid stenosis is based on velocity parameters that correlate the residual internal carotid diameter with NASCET-based stenosis levels, using the diameter of the distal internal carotid lumen as the denominator for stenosis measurement. The following velocity measurements were obtained: RIGHT ICA:  119 cm/sec CCA:  59 cm/sec SYSTOLIC ICA/CCA RATIO:  2.0 ECA:  85 cm/sec LEFT ICA:  107 cm/sec CCA:  58 cm/sec SYSTOLIC ICA/CCA RATIO:  1.9 ECA:  72 cm/sec RIGHT CAROTID ARTERY: Mild irregular calcified plaque in the bulb. Low resistance internal carotid Doppler pattern. RIGHT VERTEBRAL ARTERY:  Antegrade. LEFT CAROTID ARTERY: Mild calcified plaque in the mid common carotid. Little if any plaque in the bulb. Low resistance internal carotid Doppler pattern is preserved. LEFT VERTEBRAL ARTERY:  Antegrade. IMPRESSION: Less than 50% stenosis in the right and left internal carotid arteries. Electronically Signed   By: Marybelle Killings M.D.   On: 06/10/2017 09:37   Dg Hip Unilat With Pelvis 2-3 Views Right  Result Date: 05/12/2017 X-ray Chief complaint right hip fracture ORIF AP pelvis right hip AP and lateral. Internal fixation device is noted in the right proximal femur.  So-called gamma nail.  Fracture impaction is good.  Hardware is intact. Impression stable fixation right intertrochanteric fracture   Time Spent in minutes  25   Bowen Goyal M.D on 06/10/2017 at 11:32 AM  Between 7am to 7pm - Pager - [phone]  After 7pm go to [email or web address] - password [facility]  [facility] -  Office  [phone]

## 2017-06-10 NOTE — Evaluation (Signed)
Physical Therapy Evaluation Patient Details Name: Cassidy Smith MRN: [id number] DOB: 1955/10/05 Today's Date: 06/10/2017   History of Present Illness  Cassidy Smith is a 62 y.o. female with medical history significant for bipolar type I disorder, chronic SIADH, hypothyroidism, and GERD who is brought to the emergency department after a fall this morning where she struck her right thoracic back on the bedpost and sustained a laceration to her right rib cage.  She cannot accurately state whether or not she lost consciousness and what exactly caused her fall.  Family at the bedside state that they were quite concerned as the patient was noted to be quite lethargic with slurred speech and had significant gait abnormalities.    Clinical Impression  Patient functioning near baseline for functional mobility and gait, unsteady on feet with tendency to use side rails in hallway with fair return demonstrated for using cane secondary to not touching it to floor when walking, after VC's demonstrated improvement, once fatigued patient resorts to festinating like gait resulting in pauses/stopping requiring verbal cues to take longer steps.  Patient will benefit from continued physical therapy in hospital and recommended venue below to increase strength, balance, endurance for safe ADLs and gait.     Follow Up Recommendations Home health PT;Supervision/Assistance - 24 hour;Supervision for mobility/OOB    Equipment Recommendations  None recommended by PT    Recommendations for Other Services       Precautions / Restrictions Precautions Precautions: Fall Restrictions Weight Bearing Restrictions: No      Mobility  Bed Mobility Overal bed mobility: Needs Assistance Bed Mobility: Supine to Sit     Supine to sit: Min guard     General bed mobility comments: slow labored movement  Transfers Overall transfer level: Needs assistance Equipment used: Straight cane;1 person hand held  assist Transfers: Sit to/from Stand;Stand Pivot Transfers Sit to Stand: Min assist Stand pivot transfers: Min assist       General transfer comment: unsteady on feet  Ambulation/Gait Ambulation/Gait assistance: Min assist Ambulation Distance (Feet): 60 Feet Assistive device: Straight cane Gait Pattern/deviations: Decreased step length - right;Decreased step length - left;Decreased stride length;Festinating Gait velocity: decreased   General Gait Details: demonstrates slow labored cadence with tendency to not touch cane to floor most of time, frequent use of siderails in hallway, once fatigue steps become short with pauses/stops resembling festinating like gait  Stairs            Wheelchair Mobility    Modified Rankin (Stroke Patients Only)       Balance Overall balance assessment: Needs assistance Sitting-balance support: Feet supported;No upper extremity supported Sitting balance-Leahy Scale: Good     Standing balance support: Single extremity supported;During functional activity Standing balance-Leahy Scale: Poor Standing balance comment: fair/poor with cane, once fatigued becomes less stable                             Pertinent Vitals/Pain Pain Assessment: No/denies pain    Home Living Family/patient expects to be discharged to:: Private residence Living Arrangements: Other (Comment)(brother) Available Help at Discharge: Personal care attendant Type of Home: House Home Access: Stairs to enter;Level entry Entrance Stairs-Rails: None Entrance Stairs-Number of Steps: no steps into house, 2 steps into dining room Home Layout: Two level Home Equipment: Cane - single point;Shower seat - built in Additional Comments: Pt reports she lives on 2nd level and brother is on first level. Neighbor on property assists with  getting up and down stairs    Prior Function Level of Independence: Needs assistance   Gait / Transfers Assistance Needed: uses SPC for  mobility  ADL's / Homemaking Assistance Needed: home aids x 4 hours/day x 7 days/week per patient's family member        Hand Dominance   Dominant Hand: Right    Extremity/Trunk Assessment   Upper Extremity Assessment Upper Extremity Assessment: Defer to OT evaluation    Lower Extremity Assessment Lower Extremity Assessment: Generalized weakness    Cervical / Trunk Assessment Cervical / Trunk Assessment: Normal  Communication   Communication: No difficulties  Cognition Arousal/Alertness: Awake/alert Behavior During Therapy: WFL for tasks assessed/performed Overall Cognitive Status: Within Functional Limits for tasks assessed                                        General Comments      Exercises     Assessment/Plan    PT Assessment Patient needs continued PT services  PT Problem List Decreased strength;Decreased activity tolerance;Decreased balance;Decreased mobility       PT Treatment Interventions Gait training;Stair training;Functional mobility training;Therapeutic activities;Therapeutic exercise;Patient/family education    PT Goals (Current goals can be found in the Care Plan section)  Acute Rehab PT Goals Patient Stated Goal: return home PT Goal Formulation: With patient/family Time For Goal Achievement: 06/17/17 Potential to Achieve Goals: Good    Frequency Min 3X/week   Barriers to discharge        Co-evaluation               AM-PAC PT "6 Clicks" Daily Activity  Outcome Measure Difficulty turning over in bed (including adjusting bedclothes, sheets and blankets)?: None Difficulty moving from lying on back to sitting on the side of the bed? : A Little Difficulty sitting down on and standing up from a chair with arms (e.g., wheelchair, bedside commode, etc,.)?: A Little Help needed moving to and from a bed to chair (including a wheelchair)?: A Little Help needed walking in hospital room?: A Lot Help needed climbing 3-5 steps  with a railing? : A Lot 6 Click Score: 17    End of Session Equipment Utilized During Treatment: Gait belt Activity Tolerance: Patient tolerated treatment well;Patient limited by fatigue Patient left: in chair;with chair alarm set;with call bell/phone within reach;with family/visitor present Nurse Communication: Mobility status;Other (comment)( nursing staff notified that patient left up in chair) PT Visit Diagnosis: Unsteadiness on feet (R26.81);Other abnormalities of gait and mobility (R26.89);Muscle weakness (generalized) (M62.81)    Time: 6010-9323 PT Time Calculation (min) (ACUTE ONLY): 29 min   Charges:   PT Evaluation $PT Eval Moderate Complexity: 1 Mod PT Treatments $Therapeutic Activity: 23-37 mins   PT G Codes:        1:55 PM, 06/11/17 Lonell Grandchild, MPT Physical Therapist with [facility] [phone] office [phone] mobile phone

## 2017-06-10 NOTE — Care Management Note (Signed)
Case Management Note  Patient Details  Name: Cassidy Smith MRN: [id number] Date of Birth: May 17, 1955  Subjective/Objective:         Admitted with CVA. Pt from home, active with [facility] for nursing services, recenlty DC'd from PT/OT/SLP.            Action/Plan: DC home with resumption of [facility] services. Pt interested in restarting PT and OT. Do not feel she needs SLP. [facility] rep, aware of admission. CM will provide updates and orders for [facility] services at DC.   Expected Discharge Date:      06/12/17            Expected Discharge Plan:  [place]  In-House Referral:  NA  Discharge planning Services  CM Consult  Post Acute Care Choice:  Home Health, Resumption of Svcs/PTA Provider Choice offered to:  Patient, Adult Children  HH Arranged:  RN, PT, OT [place] Agency:  [facility] (formerly [facility]  Status of Service:  In process, will continue to follow  If discussed at Long Length of Stay Meetings, dates discussed:    Additional Comments:  Sherald Barge, RN 06/10/2017, 3:30 PM

## 2017-06-10 NOTE — Plan of Care (Signed)
  Problem: Acute Rehab PT Goals(only PT should resolve) Goal: Pt Will Go Supine/Side To Sit Outcome: Progressing Flowsheets (Taken 06/10/2017 1358) Pt will go Supine/Side to Sit: with modified independence Goal: Patient Will Transfer Sit To/From Stand Outcome: Progressing Flowsheets (Taken 06/10/2017 1358) Patient will transfer sit to/from stand: with min guard assist Goal: Pt Will Transfer Bed To Chair/Chair To Bed Outcome: Progressing Flowsheets (Taken 06/10/2017 1358) Pt will Transfer Bed to Chair/Chair to Bed: min guard assist Goal: Pt Will Ambulate Outcome: Progressing Flowsheets (Taken 06/10/2017 1358) Pt will Ambulate: 75 feet;with min guard assist;with cane  1:59 PM, 06/10/17 Lonell Grandchild, MPT Physical Therapist with [facility] [phone] office [phone] mobile phone

## 2017-06-10 NOTE — Progress Notes (Signed)
Patient's family brought in her home medications for pharmacy to review for home medication list. Notified pharmacy this am. Family stated this afternoon "is anyone coming to look at her medications." notified Renato Battles, PharmD. Stated will discuss with pharm tech. Discussed medication policy with patient, that if her family leaves she can send meds home with them or we can lock up in the pharmacy until discharge. Stated she understood. Family to notify nursing staff when leaving if they do not take medications home. Donavan Foil, RN

## 2017-06-10 NOTE — Evaluation (Addendum)
Occupational Therapy Evaluation Patient Details Name: Cassidy Smith MRN: [id number] DOB: February 14, 1955 Today's Date: 06/10/2017    History of Present Illness Cassidy Smith is a 62 y.o. female with medical history significant for bipolar type I disorder, chronic SIADH, hypothyroidism, and GERD who is brought to the emergency department after a fall this morning where she struck her right thoracic back on the bedpost and sustained a laceration to her right rib cage.  She cannot accurately state whether or not she lost consciousness and what exactly caused her fall.  Family at the bedside state that they were quite concerned as the patient was noted to be quite lethargic with slurred speech and had significant gait abnormalities.   Clinical Impression   Pt received supine in bed, agreeable to OT evaluation. Increased time required for pt to provide PLOF information, unsure if information is accurate, no family available to confirm. Pt reporting poorly accessible living situation, has to go up 20 steps to the second level where she resides. Pt has aide, unsure of level of assistance required PTA. Pt is unable to perform tasks in standing due to poor balance. Did not complete seated ADLs during evaluation as transportation arrived to take pt to MRI. Will continue to follow and assess pt while in acute care. Recommend HHOT on discharge to improve independence and safety during ADLs and to improve strength required for ADL completion.     Follow Up Recommendations  HHOT; 24/7 supervision    Equipment Recommendations  None recommended by OT       Precautions / Restrictions Precautions Precautions: Fall Precaution Comments: 1 fall PTA, very unsteady on feet Restrictions Weight Bearing Restrictions: No      Mobility Bed Mobility Overal bed mobility: Needs Assistance Bed Mobility: Supine to Sit     Supine to sit: Min assist        Transfers Overall transfer level: Needs  assistance Equipment used: 1 person hand held assist Transfers: Sit to/from [facility] Sit to Stand: Min assist Stand pivot transfers: Min assist;Mod assist       General transfer comment: pt unsteady during transfer to wheelchair        ADL either performed or assessed with clinical judgement   ADL Overall ADL's : Needs assistance/impaired                                       General ADL Comments: unable to assess as transport arrived to get pt. Pt is unable to stand for tasks due to balance deficits     Vision Baseline Vision/History: No visual deficits Patient Visual Report: No change from baseline Additional Comments: to be tested             Pertinent Vitals/Pain Pain Assessment: No/denies pain     Hand Dominance Right   Extremity/Trunk Assessment Upper Extremity Assessment Upper Extremity Assessment: Generalized weakness(grossly 3+/5)   Lower Extremity Assessment Lower Extremity Assessment: Defer to PT evaluation   Cervical / Trunk Assessment Cervical / Trunk Assessment: Normal   Communication Communication Communication: No difficulties   Cognition Arousal/Alertness: Awake/alert Behavior During Therapy: WFL for tasks assessed/performed Overall Cognitive Status: No family/caregiver present to determine baseline cognitive functioning                                 General Comments: no  family present to determine baseline functioning and if PLOF information is accurate              Home Living Family/patient expects to be discharged to:: Private residence Living Arrangements: Other (Comment)(brother) Available Help at Discharge: Personal care attendant(M-F, 8-2) Type of Home: House Home Access: Stairs to enter [employer] of Steps: 3 Entrance Stairs-Rails: Right Home Layout: Two level Alternate Level Stairs-Number of Steps: 20 steps between 1st and 2nd levels-pt reports she is  upstairs   Bathroom Shower/Tub: Teacher, early years/pre: [place] - single point;Shower seat - built in   Additional Comments: Pt reports she lives on 2nd level and brother is on first level. Neighbor on property assists with getting up and down stairs      Prior Functioning/Environment Level of Independence: Needs assistance  Gait / Transfers Assistance Needed: uses SPC for mobility ADL's / Homemaking Assistance Needed: has aide M-F, 8-2 to assist with B/IADLs            OT Problem List: Decreased strength;Decreased activity tolerance;Impaired balance (sitting and/or standing);Decreased safety awareness;Decreased knowledge of use of DME or AE      OT Treatment/Interventions: Self-care/ADL training;Therapeutic exercise;Neuromuscular education;Therapeutic activities;Patient/family education    OT Goals(Current goals can be found in the care plan section) Acute Rehab OT Goals Patient Stated Goal: to gain strength  OT Goal Formulation: With patient Time For Goal Achievement: 06/24/17 Potential to Achieve Goals: Good  OT Frequency: Min 2X/week   Barriers to D/C: Inaccessible home environment       AM-PAC PT "6 Clicks" Daily Activity     Outcome Measure Help from another person eating meals?: A Little Help from another person taking care of personal grooming?: A Little Help from another person toileting, which includes using toliet, bedpan, or urinal?: A Little Help from another person bathing (including washing, rinsing, drying)?: A Little Help from another person to put on and taking off regular upper body clothing?: A Little Help from another person to put on and taking off regular lower body clothing?: A Little 6 Click Score: 18   End of Session    Activity Tolerance: Patient tolerated treatment well Patient left: Other (comment)(with transport to MRI)  OT Visit Diagnosis: Unsteadiness on feet (R26.81);Muscle weakness  (generalized) (M62.81);Repeated falls (R29.6)                Time: 6834-1962 OT Time Calculation (min): 14 min Charges:  OT General Charges $OT Visit: 1 Visit OT Evaluation $OT Eval Moderate Complexity: [place], OTR/L  [phone] 06/10/2017, 8:40 AM

## 2017-06-11 ENCOUNTER — Inpatient Hospital Stay (HOSPITAL_COMMUNITY): Payer: Medicaid Other

## 2017-06-11 ENCOUNTER — Encounter (HOSPITAL_COMMUNITY): Payer: Self-pay | Admitting: Internal Medicine

## 2017-06-11 DIAGNOSIS — I639 Cerebral infarction, unspecified: Principal | ICD-10-CM

## 2017-06-11 DIAGNOSIS — I361 Nonrheumatic tricuspid (valve) insufficiency: Secondary | ICD-10-CM

## 2017-06-11 DIAGNOSIS — W19XXXD Unspecified fall, subsequent encounter: Secondary | ICD-10-CM

## 2017-06-11 DIAGNOSIS — E222 Syndrome of inappropriate secretion of antidiuretic hormone: Secondary | ICD-10-CM

## 2017-06-11 DIAGNOSIS — F319 Bipolar disorder, unspecified: Secondary | ICD-10-CM

## 2017-06-11 DIAGNOSIS — E0590 Thyrotoxicosis, unspecified without thyrotoxic crisis or storm: Secondary | ICD-10-CM

## 2017-06-11 DIAGNOSIS — R4182 Altered mental status, unspecified: Secondary | ICD-10-CM

## 2017-06-11 DIAGNOSIS — K219 Gastro-esophageal reflux disease without esophagitis: Secondary | ICD-10-CM

## 2017-06-11 LAB — ECHOCARDIOGRAM COMPLETE
Height: 65 in
WEIGHTICAEL: 1798.95 [oz_av]

## 2017-06-11 MED ORDER — ASPIRIN EC 325 MG PO TBEC: 325.0000 mg | DELAYED_RELEASE_TABLET | Freq: Every day | ORAL | 1 refills | Status: DC

## 2017-06-11 MED ORDER — VITAMIN B-12 1000 MCG PO TABS: 1000.0000 ug | ORAL_TABLET | Freq: Every day | ORAL | 1 refills | Status: DC

## 2017-06-11 MED ORDER — ATORVASTATIN CALCIUM 20 MG PO TABS: 20.0000 mg | ORAL_TABLET | Freq: Every day | ORAL | 1 refills | Status: AC

## 2017-06-11 MED ORDER — DEMECLOCYCLINE HCL 150 MG PO TABS: 300.0000 mg | ORAL_TABLET | Freq: Two times a day (BID) | ORAL | 1 refills | Status: DC

## 2017-06-11 MED ORDER — BENZTROPINE MESYLATE 1 MG PO TABS: 1.0000 mg | ORAL_TABLET | Freq: Every day | ORAL | Status: DC

## 2017-06-11 NOTE — Consult Note (Signed)
[place] A. Merlene Laughter, MD     [email or web address]          Cassidy Smith is an 62 y.o. female.   ASSESSMENT/PLAN: 1. ACUTE ALTERED MENTAL STATUS, DYSARTHRIA AND GAIT IMPAIRMENT:   The etiology is most likely multifactorial including acute small left parietal infarct, medication effect and white matter disease.  Aspirin 325 is recommended.   2. RECURRENT FALLS - MULTIFACTORIAL: Etiologies includes multiple psychotropic medications and increased white matter disease.  The following changes will be made to the patient's medications:  We will consider discontinuing or reducing the Eliquis, we will reduce the risperidone to 1 mg.  We will discontinue the clonazepam.  We will reduce the trazodone to 100 mg.   Physical and occupational therapies are recommended.  We also suggest 3 week stay at the skilled nursing facility.  3. PLAQUE-LIKE WHITE MATTER LESIONS WERE SOME FOR MS:  Cervical spine MRI will be obtained.  4.  Neuroleptics induced parkinsonism.  The Risperdal will be reduced to 1 mg.     The history is obtained from the patient's neighbor as her history is unreliable.  They but reports that the patient has had about 2-3 falls in the past week.  The falls are associated with the patient becoming confused and dysarthric.  The patient took her nighttime medications /CV medication a few days ago and she was noted to be confused dysarthric and unsteady falling.  The neighbor reports that she has again has had a similar presentation this week.  The patient fell in February of this year fracturing her hip requiring surgery.  It was during this hospitalization that she was found to have a DVT in placed on Eliquis.  They reports that she fell a year ago and the 6 months ago.  Patient lives with her brother who is in the 71s.  She apparently has to take care of him.  They apparently living to level home and she has to get up stairs to go to her bedroom.  We saw the patient a few  years ago for ischemic stroke.  She was noted to have asymmetric tremor and we were concerned that she may have Parkinson's disease.  She also had significant white matter lesions and concerns were also raise for possible multiple sclerosis.  The patient reports that she is feeling well but she is disoriented and cannot provide an adequate history.     GENERAL:   She is doing well and feeding herself.  She is in no acute distress.  HEENT:   Dentition is poor.  Neck is supple.  No trauma appreciated.  ABDOMEN: soft  EXTREMITIES: No edema;   Bruise involving the right upper extremity apparently for recent fall.   BACK:   Normal  SKIN: Normal by inspection.    MENTAL STATUS: Alert and oriented but oriented to self and the location she is not oriented to time thinking that is 1855 and the month is May. Speech is fine, language fine. Judgment and insight markedly impaired  CRANIAL NERVES: Pupils are equal, round and reactive to light and accomodation; extra ocular movements are full, there is no significant nystagmus; visual fields are full; upper and lower facial muscles are normal in strength and symmetric, there is no flattening of the nasolabial folds; tongue is midline; uvula is midline; shoulder elevation is normal.  MOTOR: Normal tone, bulk and strength; no pronator drift.  COORDINATION: Left finger to nose is normal, right finger to nose is normal,  There is symmetric tremor at rest and with action bilaterally.  There is minimal bradykinesia noted throughout.  REFLEXES: Deep tendon reflexes are symmetrical and normal. Plantar reflexes are extensor on the right and equivocal on the left.  SENSATION: Normal to pain        NEURO NOTE 2015 1. Tiny left frontal parietal lacunar infarct. It's unclear if this is actually symptomatic. I doubt that the tiny lacunar infarct has essentially cause any symptoms in this patient. Risk factors only age. Currently no history of hypertension or  diabetes. 81 mg aspirin and should suffice for secondary stroke prevention. Avoid tobacco products.  2. Asymmetric rest tremor most consistent with Parkinson disease. Undoubtedly, the neuroleptic respirdal is likely aggravating this problem. We will reduce the dose from 8 mg to 4 mg a day.  3. Extensive white matter chronic lesions which are concerning for MS although clinically she doesn't appear to have MS. Additional labs will be obtained for homocysteine, ANA, sedimentation rate, HIV and vitamin B12 level. We also may consider doing a spinal tap in the outpatient setting. Alternatively, repeating the MRI in about 6-12 months is recommended. Cervical spine MRI will also be considered at a later date.      Blood pressure (!) 159/73, pulse 69, temperature 97.6 F (36.4 C), temperature source Oral, resp. rate 16, height 5' 5"  (1.651 m), weight 112 lb 7 oz (51 kg), SpO2 98 %.  Past Medical History:  Diagnosis Date  . Anxiety   . Bipolar 1 disorder ([place])   . Chronic hip pain   . Chronic kidney disease    uti  currently,  hx bladder spasms  . Depression   . Headache(784.0)   . Hyperthyroidism   . Neuromuscular disorder (H[facility])    shaking of hands   . SIADH (syndrome of inappropriate ADH production) ([place]) 04/08/2017    Past Surgical History:  Procedure Laterality Date  . COLONOSCOPY WITH PROPOFOL N/A 04/05/2017   Procedure: COLONOSCOPY WITH PROPOFOL;  Surgeon: Rogene Houston, MD;  [facility];  Service: Endoscopy;  Laterality: N/A;  . INTRAMEDULLARY (IM) NAIL INTERTROCHANTERIC Right 03/07/2017   Procedure: OPEN TREATMENT INTERNAL FIXATION RIGHT HIP WITH GAMA INTRAMEDULARY NAIL;  Surgeon: Carole Civil, MD;  Location: [facility];  Service: Orthopedics;  Laterality: Right;  . MULTIPLE EXTRACTIONS WITH ALVEOLOPLASTY N/A 10/30/2012   Procedure: MULTIPLE EXTRACION 5, 6, 8, 9, 10 ,18, 19, 31 WITH MAXILLARY RIGHT AND LEFT  ALVEOLOPLASTY REDUCE MAXILLARY LEFT TUBEROSITY;  Surgeon:  Gae Bon, DDS;  Location: [place];  Service: Oral Surgery;  Laterality: N/A;  . POLYPECTOMY  04/05/2017   Procedure: POLYPECTOMY;  Surgeon: Rogene Houston, MD;  [facility];  Service: Endoscopy;;  recto-sigmoid, rectum    Family History  Problem Relation Age of Onset  . Breast cancer Mother   . Cancer - Other Mother   . Alcoholism Father   . Colon cancer Neg Hx   . Colon polyps Neg Hx     Social History:  reports that she quit smoking about 5 weeks ago. Her smoking use included cigarettes. She has a 10.00 pack-year smoking history. She has never used smokeless tobacco. She reports that she does not drink alcohol or use drugs.  Allergies: No Known Allergies  Medications: Prior to Admission medications   Medication Sig Start Date End Date Taking? Authorizing Provider  apixaban (ELIQUIS) 5 MG TABS tablet Take 5 mg by mouth 2 (two) times daily.   Yes [provider]  benztropine (COGENTIN) 1 MG tablet Take 1 mg by mouth 2 (two) times daily.   Yes [provider]  Calcium Carb-Cholecalciferol [phone] MG-UNIT CAPS Take 1 capsule by mouth daily.   Yes [provider]  clonazePAM (KLONOPIN) 0.5 MG tablet Take 0.5 mg by mouth at bedtime.   Yes [provider]  divalproex (DEPAKOTE ER) 500 MG 24 hr tablet Take 1,500 mg by mouth at bedtime.    Yes [provider]  furosemide (LASIX) 20 MG tablet Take 1 tablet (20 mg total) by mouth daily. 04/18/17  Yes Barton Dubois, MD  gabapentin (NEURONTIN) 100 MG capsule Take 1 capsule (100 mg total) by mouth 3 (three) times daily. Patient taking differently: Take 300 mg by mouth 2 (two) times daily.  05/13/17  Yes Carole Civil, MD  methimazole (TAPAZOLE) 10 MG tablet Take 10 mg by mouth every other day.  12/24/15  Yes [provider]  Multiple Vitamins-Minerals (CENTRUM SILVER ADULT 50+ PO) Take 1 tablet by mouth every morning.   Yes [provider]  nicotine (NICODERM CQ -  DOSED IN MG/24 HOURS) 21 mg/24hr patch Place 21 mg onto the skin daily.   Yes [provider]  potassium chloride (K-DUR,KLOR-CON) 10 MEQ tablet Take 10 mEq by mouth daily.    Yes [provider]  RISPERDAL CONSTA 50 MG injection Inject 50 mg into the muscle every 14 (fourteen) days. 10/24/14  Yes [provider]  risperidone (RISPERDAL) 4 MG tablet Take 4 mg by mouth at bedtime. 06/22/15  Yes [provider]  sodium chloride 1 g tablet Take 2 tablets (2 g total) by mouth 3 (three) times daily with meals. Patient taking differently: Take 1 g by mouth 3 (three) times daily with meals.  04/17/17  Yes Barton Dubois, MD  traZODone (DESYREL) 100 MG tablet Take 200 mg by mouth at bedtime.   Yes [provider]    Scheduled Meds: . aspirin EC  81 mg Oral Daily  . atorvastatin  20 mg Oral q1800  . demeclocycline  300 mg Oral Q12H  . enoxaparin (LOVENOX) injection  40 mg Subcutaneous Q24H  . methimazole  10 mg Oral Daily  . multivitamin with minerals  1 tablet Oral q morning - 10a  . pantoprazole  40 mg Oral BID AC  . sodium chloride flush  3 mL Intravenous Q12H  . sodium chloride  2 g Oral TID WC  . vitamin B-12  1,000 mcg Oral Daily   Continuous Infusions: . sodium chloride     PRN Meds:.sodium chloride, acetaminophen **OR** acetaminophen, ondansetron **OR** ondansetron (ZOFRAN) IV, sodium chloride flush     Results for orders placed or performed during the hospital encounter of 06/09/17 (from the past 48 hour(s))  CBC with Differential/Platelet     Status: Abnormal   Collection Time: 06/09/17  2:04 PM  Result Value Ref Range   WBC 6.8 4.0 - 10.5 K/uL   RBC 4.17 3.87 - 5.11 MIL/uL   Hemoglobin 12.4 12.0 - 15.0 g/dL   HCT 37.6 36.0 - 46.0 %   MCV 90.2 78.0 - 100.0 fL   MCH 29.7 26.0 - 34.0 pg   MCHC 33.0 30.0 - 36.0 g/dL   RDW 14.4 11.5 - 15.5 %   Platelets 137 (L) 150 - 400 K/uL   Neutrophils Relative % 53 %   Neutro Abs 3.6 1.7 - 7.7 K/uL    Lymphocytes Relative 37 %   Lymphs Abs 2.5 0.7 -  4.0 K/uL   Monocytes Relative 9 %   Monocytes Absolute 0.6 0.1 - 1.0 K/uL   Eosinophils Relative 1 %   Eosinophils Absolute 0.1 0.0 - 0.7 K/uL   Basophils Relative 0 %   Basophils Absolute 0.0 0.0 - 0.1 K/uL    Comment: Performed at [facility], [address], [place], [place] [address]  Comprehensive metabolic panel     Status: Abnormal   Collection Time: 06/09/17  2:04 PM  Result Value Ref Range   Sodium 133 (L) 135 - 145 mmol/L   Potassium 3.5 3.5 - 5.1 mmol/L   Chloride 96 (L) 101 - 111 mmol/L   CO2 30 22 - 32 mmol/L   Glucose, Bld 117 (H) 65 - 99 mg/dL   BUN 13 6 - 20 mg/dL   Creatinine, Ser 0.55 0.44 - 1.00 mg/dL   Calcium 9.5 8.9 - 10.3 mg/dL   Total Protein 6.6 6.5 - 8.1 g/dL   Albumin 3.1 (L) 3.5 - 5.0 g/dL   AST 21 15 - 41 U/L   ALT 10 (L) 14 - 54 U/L   Alkaline Phosphatase 110 38 - 126 U/L   Total Bilirubin 0.5 0.3 - 1.2 mg/dL   GFR calc non Af Amer >60 >60 mL/min   GFR calc Af Amer >60 >60 mL/min    Comment: (NOTE) The eGFR has been calculated using the CKD EPI equation. This calculation has not been validated in all clinical situations. eGFR's persistently <60 mL/min signify possible Chronic Kidney Disease.    Anion gap 7 5 - 15    Comment: Performed at [facility], [address], [place], [place] [address]  Protime-INR     Status: None   Collection Time: 06/09/17  2:04 PM  Result Value Ref Range   Prothrombin Time 13.2 11.4 - 15.2 seconds   INR 1.01     Comment: Performed at [facility], [address] [place], [place] [address]  Ethanol     Status: None   Collection Time: 06/09/17  2:04 PM  Result Value Ref Range   Alcohol, Ethyl (B) <10 <10 mg/dL    Comment: (NOTE) Lowest detectable limit for serum alcohol is 10 mg/dL. For medical purposes only. Performed at [facility], [address] [place], [place] [address]   Rapid urine drug screen (hospital performed)     Status: None   Collection Time:  06/09/17  2:04 PM  Result Value Ref Range   Opiates NONE DETECTED NONE DETECTED   Cocaine NONE DETECTED NONE DETECTED   Benzodiazepines NONE DETECTED NONE DETECTED   Amphetamines NONE DETECTED NONE DETECTED   Tetrahydrocannabinol NONE DETECTED NONE DETECTED   Barbiturates NONE DETECTED NONE DETECTED    Comment: (NOTE) DRUG SCREEN FOR MEDICAL PURPOSES ONLY.  IF CONFIRMATION IS NEEDED FOR ANY PURPOSE, NOTIFY LAB WITHIN 5 DAYS. LOWEST DETECTABLE LIMITS FOR URINE DRUG SCREEN Drug Class                     Cutoff (ng/mL) Amphetamine and metabolites    1000 Barbiturate and metabolites    200 Benzodiazepine                 537 Tricyclics and metabolites     300 Opiates and metabolites        300 Cocaine and metabolites        300 THC  55 Performed at [facility], [address], [place], [place] [address]   Urinalysis, Routine w reflex microscopic     Status: None   Collection Time: 06/09/17  2:04 PM  Result Value Ref Range   Color, Urine YELLOW YELLOW   APPearance CLEAR CLEAR   Specific Gravity, Urine 1.017 1.005 - 1.030   pH 7.0 5.0 - 8.0   Glucose, UA NEGATIVE NEGATIVE mg/dL   Hgb urine dipstick NEGATIVE NEGATIVE   Bilirubin Urine NEGATIVE NEGATIVE   Ketones, ur NEGATIVE NEGATIVE mg/dL   Protein, ur NEGATIVE NEGATIVE mg/dL   Nitrite NEGATIVE NEGATIVE   Leukocytes, UA NEGATIVE NEGATIVE    Comment: Performed at [facility], [address] [place], [place] [address]  APTT     Status: None   Collection Time: 06/09/17  2:04 PM  Result Value Ref Range   aPTT 32 24 - 36 seconds    Comment: Performed at [facility], [address], [place], [place] [address]  TSH     Status: None   Collection Time: 06/09/17  2:04 PM  Result Value Ref Range   TSH 2.742 0.350 - 4.500 uIU/mL    Comment: Performed by a 3rd Generation assay with a functional sensitivity of <=0.01 uIU/mL. Performed at [facility], [address], [place], [place] [address]   T4, free      Status: None   Collection Time: 06/09/17  2:04 PM  Result Value Ref Range   Free T4 0.91 0.82 - 1.77 ng/dL    Comment: (NOTE) Biotin ingestion may interfere with free T4 tests. If the results are inconsistent with the TSH level, previous test results, or the clinical presentation, then consider biotin interference. If needed, order repeat testing after stopping biotin. Performed at [facility], [place] [address], [place], [place] [address]   Type and screen     Status: None   Collection Time: 06/09/17  2:16 PM  Result Value Ref Range   ABO/RH(D) O POS    Antibody Screen NEG    Sample Expiration      06/12/2017 Performed at [facility], [address], [place], [place] [address]   Basic metabolic panel     Status: Abnormal   Collection Time: 06/10/17  5:05 AM  Result Value Ref Range   Sodium 136 135 - 145 mmol/L   Potassium 4.0 3.5 - 5.1 mmol/L   Chloride 99 (L) 101 - 111 mmol/L   CO2 32 22 - 32 mmol/L   Glucose, Bld 85 65 - 99 mg/dL   BUN 15 6 - 20 mg/dL   Creatinine, Ser 0.47 0.44 - 1.00 mg/dL   Calcium 9.0 8.9 - 10.3 mg/dL   GFR calc non Af Amer >60 >60 mL/min   GFR calc Af Amer >60 >60 mL/min    Comment: (NOTE) The eGFR has been calculated using the CKD EPI equation. This calculation has not been validated in all clinical situations. eGFR's persistently <60 mL/min signify possible Chronic Kidney Disease.    Anion gap 5 5 - 15    Comment: Performed at [facility], [address], [place], [place] [address]  CBC     Status: Abnormal   Collection Time: 06/10/17  5:05 AM  Result Value Ref Range   WBC 5.5 4.0 - 10.5 K/uL   RBC 3.80 (L) 3.87 - 5.11 MIL/uL   Hemoglobin 11.0 (L) 12.0 - 15.0 g/dL   HCT 34.4 (L) 36.0 - 46.0 %   MCV 90.5 78.0 -  100.0 fL   MCH 28.9 26.0 - 34.0 pg   MCHC 32.0 30.0 - 36.0 g/dL   RDW 14.7 11.5 - 15.5 %   Platelets 129 (L) 150 - 400 K/uL    Comment: Performed at [facility], [address], [place], [place] [address]  Lipid panel      Status: None   Collection Time: 06/10/17  5:05 AM  Result Value Ref Range   Cholesterol 136 0 - 200 mg/dL   Triglycerides 61 <150 mg/dL   HDL 48 >40 mg/dL   Total CHOL/HDL Ratio 2.8 RATIO   VLDL 12 0 - 40 mg/dL   LDL Cholesterol 76 0 - 99 mg/dL    Comment:        Total Cholesterol/HDL:CHD Risk Coronary Heart Disease Risk Table                     Men   Women  1/2 Average Risk   3.4   3.3  Average Risk       5.0   4.4  2 X Average Risk   9.6   7.1  3 X Average Risk  23.4   11.0        Use the calculated Patient Ratio above and the CHD Risk Table to determine the patient's CHD Risk.        ATP III CLASSIFICATION (LDL):  <100     mg/dL   Optimal  100-129  mg/dL   Near or Above                    Optimal  130-159  mg/dL   Borderline  160-189  mg/dL   High  >190     mg/dL   Very High Performed at [facility], [address] [place], [place] [address]   Hemoglobin A1c     Status: None   Collection Time: 06/10/17  5:05 AM  Result Value Ref Range   Hgb A1c MFr Bld 5.0 4.8 - 5.6 %    Comment: (NOTE) Pre diabetes:          5.7%-6.4% Diabetes:              >6.4% Glycemic control for   <7.0% adults with diabetes    Mean Plasma Glucose 96.8 mg/dL    Comment: Performed at [facility] [address] [place], [place] [address]    Studies/Results: BRAIN MRI MRA FINDINGS: MRI HEAD FINDINGS  Brain: A 6 mm nonhemorrhagic white matter infarct is present in the posterior left corona radiata. Associated T2 signal changes are present. Asymmetric T2 signal changes are present in the right lentiform nucleus and corona radiata. A remote lacunar infarct is again noted within the right cerebellum.  The ventricles are proportionate to the degree of atrophy. No significant extra-axial fluid collections are present. The internal auditory canals are within normal limits.  Vascular: Flow is present in the major intracranial arteries.  Skull and upper cervical spine: Skull  base is within normal limits. There is some fluid in the nasopharynx. Craniocervical junction is otherwise normal.  Sinuses/Orbits: Mild mucosal thickening is present within the ethmoid air cells bilaterally. Remaining paranasal sinuses and the mastoid air cells are clear. The globes and orbits are within normal limits.  MRA HEAD FINDINGS  Internal carotid arteries are within normal limits from the high cervical segments through the ICA termini bilaterally. The A1 and M1 segments are normal. The left A1 segment is dominant. The  anterior communicating artery is patent. MCA bifurcations are intact. There is some attenuation of distal MCA branch vessels bilaterally without a significant proximal stenosis or occlusion.  The left vertebral artery is slightly dominant to the right. PICA origins are visualized and normal. The basilar artery is normal. The left posterior cerebral artery emanates from the basilar tip. The right posterior cerebral artery is of fetal type. There is moderate attenuation of distal PCA branches without a significant proximal stenosis or occlusion.  IMPRESSION: 1. 6 mm nonhemorrhagic infarct in the posterior left corona radiata adjacent to the lateral ventricle. 2. Remote ischemic changes of the basal ganglia and corona radiata are otherwise asymmetric on the right. 3. Mild generalized atrophy. 4. Remote lacunar infarct of the right cerebellum. 5. MRA demonstrates moderate medium and distal small vessel disease without significant proximal stenosis, aneurysm, or branch vessel Occlusion.     The brain MRI and MRA reviewed impression.  There is a tiny increased signal seen on DWI involving the deep white matter perpendicular to the posterior horn of the lateral ventricle on the left side.  No other acute lesions are appreciated.  No large vessel remote infarcts are seen.  No hemorrhages appreciated.  There are few perpendicular plaque-like lesions seen on  the FLAIR imaging that are perpendicular to the ventricle on the right side worrisome for possible demyelinating plaque.  The PCA on the right seems to be azygous and not well visualized possibly due to disease.  There is mild luminal irregularity of the MCA bilaterally.    Aryianna Earwood A. Merlene Laughter, M.D.  Diplomate, Tax adviser of Psychiatry and Neurology ( Neurology). 06/11/2017, 8:23 AM

## 2017-06-11 NOTE — Progress Notes (Signed)
Attempted to notify patient's brother Lorrin Goodell of discharge for today this afternoon. Unable to reach him at numbers listed. Patient requested we contact Sebeste who is also listed, states she is her cousin and will come pick her up or notify [name] for [place]. Anders Simmonds stated she would come to pick her up for discharge. Nursing later able to reach [name], stated he understood discharge plan and that home health will resume and okay for Sebeste to bring her home. Home health arranged to continue after discharge per CM. Patient and Anders Simmonds verbalized understanding. Follow-up appointments scheduled for PCP and neurology. Discharge instructions reviewed with patient. Sebeste at bedside for discharge instructions at patient request. Given copy of AVS. Reviewed home medications and when next due. Verbalized understanding of instructions, follow-up appointments, stroke symptoms, prevention and education, when to seek medical care, and to pick up prescriptions that were sent to her pharmacy. IV site d/c'd, site within normal limits. Pt left floor in stable condition via w/c accompanied by nursing staff member. Donavan Foil, RN

## 2017-06-11 NOTE — Discharge Summary (Signed)
Physician Discharge Summary  Cassidy Smith EXB:[id number] DOB: May 14, 1955 DOA: 06/09/2017  PCP: Neale Burly, MD  Admit date: 06/09/2017 Discharge date: 06/11/2017  Time spent: 35 minutes  Recommendations for Outpatient Follow-up:  1. These repeat basic metabolic panel to follow electrolytes and renal function 2. Patient to have 30 days event monitoring as an outpatient (please guarantee that these happens, requests to cardiology group and order provided). 3. Reassess blood pressure and further adjust antihypertensive regimen as needed. 4. Patient to follow-up with neurology service in 4-6 weeks.   Discharge Diagnoses:  Principal Problem:   Fall Active Problems:   Bipolar 1 disorder ([place])   Anticoagulated   Hyperthyroidism   SIADH (syndrome of inappropriate ADH production) (HCC)   GERD (gastroesophageal reflux disease)   Altered mental status   Ischemic stroke [facility])   Discharge Condition: Stable and improved.  Patient discharged home with home health services and instructions to follow-up with PCP in 10 days.  She will also need follow-up with neurology as an outpatient in 4-6 weeks.  Diet recommendation: heart healthy diet   Filed Weights   06/09/17 1312 06/09/17 2230  Weight: 49.9 kg (110 lb) 51 kg (112 lb 7 oz)    History of present illness:  As per H&P written by Dr. Manuella Ghazi on 06/09/17 62 y.o. female with medical history significant for bipolar type I disorder, chronic SIADH, hypothyroidism, and GERD who is brought to the emergency department after a fall this morning where she struck her right thoracic back on the bedpost and sustained a laceration to her right rib cage.  She cannot accurately state whether or not she lost consciousness and what exactly caused her fall.  Family at the bedside state that they were quite concerned as the patient was noted to be quite lethargic with slurred speech and had significant gait abnormalities. Patient denies any headache, visual  changes, trouble swallowing, or numbness and tingling in her extremities.   ED Course: Vital signs are currently stable.  Laboratory data demonstrates platelets of 137, sodium 133, and glucose 117.  Chest x-ray does not demonstrate any rib fractures or acute abnormalities.  CT of the head demonstrates atrophy, but no other acute abnormalities.  Hospital Course:  1-acute ischemic stroke  -stroke work up completed -case discussed with neurology -patient would need 30 days event monitoring -discharge on full dose Aspirin for secondary prevention -continue risk factors modification (statins and antihypertensive drugs) -PT recommended SNF; but patient decline it; [facility] services for PT, RN and OT arranged at discharge  2-acute metabolic encephalopathy -due to ischemic stroke -also with concerns for vascular dementia (due to changes small vessel disease changes seen on MRI/MRA) -mentation improved and close to baseline -will focus on secondary prevention   3-tobacco abuse -I have discussed tobacco cessation with the patient.  I have counseled the patient regarding the negative impacts of continued tobacco use including but not limited to lung cancer, COPD, and cardiovascular disease.  I have discussed alternatives to tobacco and modalities that may help facilitate tobacco cessation including but not limited to biofeedback, hypnosis, and medications.  Total time spent with tobacco counseling was 5 minutes -patient decline nicotine patch at discharge   4-hx of DVT: -completed 3 months treatment with eliquis -now with hx of falling, unsafe to be chronically on anticoagulation anyway -no Le swelling or pain appreciated  5-hyperthyroidism -continue Tapazole  6-stage 1 pressure injury on skin right hip -prevention measures recommended   7-history of SIADH -continue demeclocycline  8-bipolar disorder  -continue risperdal and benztropine -outpatient follow up with psychiatry service -continue  trazodone   9-HLD -continue statins    Procedures:  See below for x-ray reports.  2-D echo:  Carotid duplex:Less than 50% stenosis in the right and left internal carotid arteries.  Consultations:  Neurology   Discharge Exam: Vitals:   06/11/17 0828 06/11/17 1201  BP: (!) 152/88 (!) 150/84  Pulse: 77 75  Resp: 18 18  Temp: 97.8 F (36.6 C) 98.3 F (36.8 C)  SpO2: 97% 96%   Gen: not in distress HEENT: moist mucosa, supple neck Chest: clear b/l, no added sounds CVS: N S1&S2, no murmurs, rubs or gallop GI: soft, NT, ND, BS+ Musculoskeletal: warm, no edema CNS: Alert and oriented X2 (some trouble remembering time), non-focal deficit appreciated, CN intact, positive fine resting tremors  Discharge Instructions   Discharge Instructions    Diet - low sodium heart healthy   Complete by:  As directed    Discharge instructions   Complete by:  As directed    Medications as prescribed Full low-dose aspirin on daily basis for prevention purposes Follow instruction from home health services to assist with conditioning and recovery of your strength Arrange follow-up with PCP in 10 days Outpatient follow-up with neurology service in 4-6 weeks   Increase activity slowly   Complete by:  As directed      Allergies as of 06/11/2017   No Known Allergies     Medication List    STOP taking these medications   clonazePAM 0.5 MG tablet Commonly known as:  KLONOPIN   ELIQUIS 5 MG Tabs tablet Generic drug:  apixaban   RISPERDAL CONSTA 50 MG injection Generic drug:  risperiDONE microspheres   risperidone 4 MG tablet Commonly known as:  RISPERDAL     TAKE these medications   aspirin EC 325 MG tablet Take 1 tablet (325 mg total) by mouth daily.   atorvastatin 20 MG tablet Commonly known as:  LIPITOR Take 1 tablet (20 mg total) by mouth daily at 6 PM.   benztropine 1 MG tablet Commonly known as:  COGENTIN Take 1 tablet (1 mg total) by mouth daily. What changed:   when to take this   Calcium Carb-Cholecalciferol [phone] MG-UNIT Caps Take 1 capsule by mouth daily.   CENTRUM SILVER ADULT 50+ PO Take 1 tablet by mouth every morning.   demeclocycline 150 MG tablet Commonly known as:  DECLOMYCIN Take 2 tablets (300 mg total) by mouth every 12 (twelve) hours.   divalproex 500 MG 24 hr tablet Commonly known as:  DEPAKOTE ER Take 1,500 mg by mouth at bedtime.   furosemide 20 MG tablet Commonly known as:  LASIX Take 1 tablet (20 mg total) by mouth daily.   gabapentin 100 MG capsule Commonly known as:  NEURONTIN Take 1 capsule (100 mg total) by mouth 3 (three) times daily. What changed:    how much to take  when to take this   methimazole 10 MG tablet Commonly known as:  TAPAZOLE Take 10 mg by mouth every other day.   nicotine 21 mg/24hr patch Commonly known as:  NICODERM CQ - dosed in mg/24 hours Place 21 mg onto the skin daily.   potassium chloride 10 MEQ tablet Commonly known as:  K-DUR,KLOR-CON Take 10 mEq by mouth daily.   sodium chloride 1 g tablet Take 2 tablets (2 g total) by mouth 3 (three) times daily with meals. What changed:  how much to take  traZODone 100 MG tablet Commonly known as:  DESYREL Take 200 mg by mouth at bedtime.   vitamin B-12 1000 MCG tablet Commonly known as:  CYANOCOBALAMIN Take 1 tablet (1,000 mcg total) by mouth daily.      No Known Allergies Follow-up Information    Hasanaj, Samul Dada, MD. Schedule an appointment as soon as possible for a visit in 10 day(s).   Specialty:  Internal Medicine Contact information: [place] [address] [phone]        Phillips Odor, MD. Schedule an appointment as soon as possible for a visit in 6 week(s).   Specialty:  Neurology Contact information: [address] [place] [address] [phone]            The results of significant diagnostics from this hospitalization (including imaging, microbiology, ancillary and  laboratory) are listed below for reference.    Significant Diagnostic Studies: Dg Ribs Unilateral W/chest Right  Result Date: 06/09/2017 CLINICAL DATA:  Status post fall with right-sided chest wall pain. EXAM: RIGHT RIBS AND CHEST - 3+ VIEW COMPARISON:  Chest radiograph 04/05/2017 FINDINGS: No fracture or other bone lesions are seen involving the ribs. There is no evidence of pneumothorax or pleural effusion. Both lungs are clear. Heart size and mediastinal contours are within normal limits. Calcific atherosclerotic disease and tortuosity of the aorta noted. IMPRESSION: No evidence of rib fractures. Electronically Signed   By: Fidela Salisbury M.D.   On: 06/09/2017 14:45   Ct Head Wo Contrast  Result Date: 06/09/2017 CLINICAL DATA:  Pain following fall EXAM: CT HEAD WITHOUT CONTRAST TECHNIQUE: Contiguous axial images were obtained from the base of the skull through the vertex without intravenous contrast. COMPARISON:  November 30, 2015 FINDINGS: Brain: Moderate diffuse atrophy is stable. There is no intracranial mass, hemorrhage, extra-axial fluid collection, or midline shift. There is small vessel disease in the centra semiovale bilaterally, somewhat more on the right than on the left. Small vessel disease is noted in the anterior limb right external capsule, stable. There is stable small vessel disease in the dentate nucleus of the cerebellum on the right, stable. There is a prior lacunar infarct in right cerebellum posterior fourth ventricle. There is no new gray-white compartment lesion. No acute infarct evident. Vascular: No evident hyperdense vessel. There is calcification in each carotid siphon region. Skull: The bony calvarium appears intact. Sinuses/Orbits: There is mucosal thickening several ethmoid air cells bilaterally. Other paranasal sinuses are clear. There is leftward deviation of the nasal septum. Orbits appear symmetric bilaterally. Other: Mastoid air cells are clear. IMPRESSION:  Atrophy with supratentorial and infratentorial small vessel disease, stable. No acute infarct evident. No mass or hemorrhage. There are foci of arterial vascular calcification. There is mucosal thickening in several ethmoid air cells. There is deviation of the nasal septum. Electronically Signed   By: Lowella Grip III M.D.   On: 06/09/2017 14:49   Mr Jodene Nam Head Wo Contrast  Result Date: 06/10/2017 CLINICAL DATA:  Neuro deficits, subacute.  Recent fall. EXAM: MRI HEAD WITHOUT CONTRAST MRA HEAD WITHOUT CONTRAST TECHNIQUE: Multiplanar, multiecho pulse sequences of the brain and surrounding structures were obtained without intravenous contrast. Angiographic images of the head were obtained using MRA technique without contrast. COMPARISON:  MRI brain 06/10/2015.  CT head 06/09/2017. FINDINGS: MRI HEAD FINDINGS Brain: A 6 mm nonhemorrhagic white matter infarct is present in the posterior left corona radiata. Associated T2 signal changes are present. Asymmetric T2 signal changes are present in the right  lentiform nucleus and corona radiata. A remote lacunar infarct is again noted within the right cerebellum. The ventricles are proportionate to the degree of atrophy. No significant extra-axial fluid collections are present. The internal auditory canals are within normal limits. Vascular: Flow is present in the major intracranial arteries. Skull and upper cervical spine: Skull base is within normal limits. There is some fluid in the nasopharynx. Craniocervical junction is otherwise normal. Sinuses/Orbits: Mild mucosal thickening is present within the ethmoid air cells bilaterally. Remaining paranasal sinuses and the mastoid air cells are clear. The globes and orbits are within normal limits. MRA HEAD FINDINGS Internal carotid arteries are within normal limits from the high cervical segments through the ICA termini bilaterally. The A1 and M1 segments are normal. The left A1 segment is dominant. The anterior  communicating artery is patent. MCA bifurcations are intact. There is some attenuation of distal MCA branch vessels bilaterally without a significant proximal stenosis or occlusion. The left vertebral artery is slightly dominant to the right. PICA origins are visualized and normal. The basilar artery is normal. The left posterior cerebral artery emanates from the basilar tip. The right posterior cerebral artery is of fetal type. There is moderate attenuation of distal PCA branches without a significant proximal stenosis or occlusion. IMPRESSION: 1. 6 mm nonhemorrhagic infarct in the posterior left corona radiata adjacent to the lateral ventricle. 2. Remote ischemic changes of the basal ganglia and corona radiata are otherwise asymmetric on the right. 3. Mild generalized atrophy. 4. Remote lacunar infarct of the right cerebellum. 5. MRA demonstrates moderate medium and distal small vessel disease without significant proximal stenosis, aneurysm, or branch vessel occlusion. Electronically Signed   By: San Morelle M.D.   On: 06/10/2017 09:22   Mr Brain Wo Contrast  Result Date: 06/10/2017 CLINICAL DATA:  Neuro deficits, subacute.  Recent fall. EXAM: MRI HEAD WITHOUT CONTRAST MRA HEAD WITHOUT CONTRAST TECHNIQUE: Multiplanar, multiecho pulse sequences of the brain and surrounding structures were obtained without intravenous contrast. Angiographic images of the head were obtained using MRA technique without contrast. COMPARISON:  MRI brain 06/10/2015.  CT head 06/09/2017. FINDINGS: MRI HEAD FINDINGS Brain: A 6 mm nonhemorrhagic white matter infarct is present in the posterior left corona radiata. Associated T2 signal changes are present. Asymmetric T2 signal changes are present in the right lentiform nucleus and corona radiata. A remote lacunar infarct is again noted within the right cerebellum. The ventricles are proportionate to the degree of atrophy. No significant extra-axial fluid collections are present.  The internal auditory canals are within normal limits. Vascular: Flow is present in the major intracranial arteries. Skull and upper cervical spine: Skull base is within normal limits. There is some fluid in the nasopharynx. Craniocervical junction is otherwise normal. Sinuses/Orbits: Mild mucosal thickening is present within the ethmoid air cells bilaterally. Remaining paranasal sinuses and the mastoid air cells are clear. The globes and orbits are within normal limits. MRA HEAD FINDINGS Internal carotid arteries are within normal limits from the high cervical segments through the ICA termini bilaterally. The A1 and M1 segments are normal. The left A1 segment is dominant. The anterior communicating artery is patent. MCA bifurcations are intact. There is some attenuation of distal MCA branch vessels bilaterally without a significant proximal stenosis or occlusion. The left vertebral artery is slightly dominant to the right. PICA origins are visualized and normal. The basilar artery is normal. The left posterior cerebral artery emanates from the basilar tip. The right posterior cerebral artery is of fetal type.  There is moderate attenuation of distal PCA branches without a significant proximal stenosis or occlusion. IMPRESSION: 1. 6 mm nonhemorrhagic infarct in the posterior left corona radiata adjacent to the lateral ventricle. 2. Remote ischemic changes of the basal ganglia and corona radiata are otherwise asymmetric on the right. 3. Mild generalized atrophy. 4. Remote lacunar infarct of the right cerebellum. 5. MRA demonstrates moderate medium and distal small vessel disease without significant proximal stenosis, aneurysm, or branch vessel occlusion. Electronically Signed   By: San Morelle M.D.   On: 06/10/2017 09:22   Mr Cervical Spine Wo Contrast  Result Date: 06/11/2017 CLINICAL DATA:  Ataxia.  Frequent falls. EXAM: MRI CERVICAL SPINE WITHOUT CONTRAST TECHNIQUE: Multiplanar, multisequence MR  imaging of the cervical spine was performed. No intravenous contrast was administered. COMPARISON:  Cervical spine CT 06/07/2015 FINDINGS: The study is motion degraded, with moderate to severe motion on the axial T2 gradient echo sequence. Alignment: Normal. Vertebrae: Degenerative appearing facet edema on the right at C3-4 and on the left at C4-5. No fracture or suspicious osseous lesion identified. Mild degenerative endplate changes at N8-2. Cord: Focal T2 hyperintensity in the cord bilaterally at C6-7. Posterior Fossa, vertebral arteries, paraspinal tissues: Unremarkable. Disc levels: C2-3: Left uncovertebral spurring and mild facet arthrosis result in moderate left neural foraminal stenosis. No spinal stenosis. C3-4: Disc bulging, uncovertebral spurring, and moderate to severe right and mild left facet arthrosis result in borderline to mild spinal stenosis and severe right neural foraminal stenosis. C4-5: Minimal disc bulging, uncovertebral spurring, and moderate facet arthrosis result in mild bilateral neural foraminal stenosis without spinal stenosis. C5-6: Small central disc protrusion without significant spinal stenosis. Uncovertebral spurring and moderate facet arthrosis result in mild right neural foraminal stenosis without spinal stenosis. C6-7: Disc bulging, central disc protrusion, uncovertebral spurring, infolding of the ligamentum flavum, and mild left facet arthrosis result in moderate spinal stenosis with mild cord flattening and severe left neural foraminal stenosis, progressed from the prior CT. C7-T1: Only imaged sagittally. Minimal disc bulging without stenosis. IMPRESSION: 1. 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 2. Borderline spinal stenosis and severe right neural foraminal stenosis at C3-4. 3. Moderate left neural foraminal stenosis at  C2-3. 4. Facet arthritis with prominent edema at C3-4 and C4-5. Electronically Signed   By: Logan Bores M.D.   On: 06/11/2017 11:05   US Carotid Bilateral  Result Date: 06/10/2017 CLINICAL DATA:  Syncope EXAM: BILATERAL CAROTID DUPLEX ULTRASOUND TECHNIQUE: Pearline Cables scale imaging, color Doppler and duplex ultrasound were performed of bilateral carotid and vertebral arteries in the neck. COMPARISON:  None. FINDINGS: Criteria: Quantification of carotid stenosis is based on velocity parameters that correlate the residual internal carotid diameter with NASCET-based stenosis levels, using the diameter of the distal internal carotid lumen as the denominator for stenosis measurement. The following velocity measurements were obtained: RIGHT ICA:  119 cm/sec CCA:  59 cm/sec SYSTOLIC ICA/CCA RATIO:  2.0 ECA:  85 cm/sec LEFT ICA:  107 cm/sec CCA:  58 cm/sec SYSTOLIC ICA/CCA RATIO:  1.9 ECA:  72 cm/sec RIGHT CAROTID ARTERY: Mild irregular calcified plaque in the bulb. Low resistance internal carotid Doppler pattern. RIGHT VERTEBRAL ARTERY:  Antegrade. LEFT CAROTID ARTERY: Mild calcified plaque in the mid common carotid. Little if any plaque in the bulb. Low resistance internal carotid Doppler pattern is preserved. LEFT VERTEBRAL ARTERY:  Antegrade. IMPRESSION: Less than 50% stenosis in the  right and left internal carotid arteries. Electronically Signed   By: Marybelle Killings M.D.   On: 06/10/2017 09:37   Dg Hip Unilat With Pelvis 2-3 Views Right  Result Date: 05/12/2017 X-ray Chief complaint right hip fracture ORIF AP pelvis right hip AP and lateral. Internal fixation device is noted in the right proximal femur.  So-called gamma nail.  Fracture impaction is good.  Hardware is intact. Impression stable fixation right intertrochanteric fracture   Microbiology: No results found for this or any previous visit (from the past 240 hour(s)).   Labs: Basic Metabolic Panel: Recent Labs  Lab 06/09/17 1404 06/10/17 0505  NA  133* 136  K 3.5 4.0  CL 96* 99*  CO2 30 32  GLUCOSE 117* 85  BUN 13 15  CREATININE 0.55 0.47  CALCIUM 9.5 9.0   Liver Function Tests: Recent Labs  Lab 06/09/17 1404  AST 21  ALT 10*  ALKPHOS 110  BILITOT 0.5  PROT 6.6  ALBUMIN 3.1*   No results for input(s): LIPASE, AMYLASE in the last 168 hours. No results for input(s): AMMONIA in the last 168 hours. CBC: Recent Labs  Lab 06/09/17 1404 06/10/17 0505  WBC 6.8 5.5  NEUTROABS 3.6  --   HGB 12.4 11.0*  HCT 37.6 34.4*  MCV 90.2 90.5  PLT 137* 129*   Cardiac Enzymes: No results for input(s): CKTOTAL, CKMB, CKMBINDEX, TROPONINI in the last 168 hours. BNP: BNP (last 3 results) No results for input(s): BNP in the last 8760 hours.  ProBNP (last 3 results) No results for input(s): PROBNP in the last 8760 hours.  CBG: No results for input(s): GLUCAP in the last 168 hours.     Signed:  Barton Dubois MD.  [facility] 06/11/2017, 2:18 PM

## 2017-06-11 NOTE — Progress Notes (Signed)
Physical Therapy Treatment Patient Details Name: Cassidy Smith MRN: [id number] DOB: 09-19-1955 Today's Date: 06/11/2017    History of Present Illness Cassidy Smith is a 62 y.o. female with medical history significant for bipolar type I disorder, chronic SIADH, hypothyroidism, and GERD who is brought to the emergency department after a fall this morning where she struck her right thoracic back on the bedpost and sustained a laceration to her right rib cage.  She cannot accurately state whether or not she lost consciousness and what exactly caused her fall.  Family at the bedside state that they were quite concerned as the patient was noted to be quite lethargic with slurred speech and had significant gait abnormalities.    PT Comments    Patient presents standing in hallway with her flip flops in her hand, appears confused making statement as though she is at home.  Patient able to ambulate back to room using RW with slight improvement in balance as compared to yesterday, required verbal cues to take longer steps with fair/good carryover.  Patient alert and orientated to self, place, day of the week, and required VCs/clues for year.  Patient tolerated sitting up in chair with chair alarm on, RN in room and notified that patient was wandering in hallway.   Follow Up Recommendations  Home health PT;Supervision/Assistance - 24 hour;Supervision for mobility/OOB     Equipment Recommendations  None recommended by PT    Recommendations for Other Services       Precautions / Restrictions Precautions Precautions: Fall Restrictions Weight Bearing Restrictions: No    Mobility  Bed Mobility               General bed mobility comments: patient presents standing in hallway  Transfers Overall transfer level: Needs assistance Equipment used: 1 person hand held assist Transfers: Sit to/from Stand;Stand Pivot Transfers Sit to Stand: Min guard Stand pivot transfers: Min guard        General transfer comment: unsteady with tendency to reach for nearby objects for support  Ambulation/Gait Ambulation/Gait assistance: Min assist Ambulation Distance (Feet): 55 Feet Assistive device: Rolling walker (2 wheeled) Gait Pattern/deviations: Decreased step length - right;Decreased step length - left;Decreased stride length Gait velocity: decreased   General Gait Details: slightly labored slow cadence with tendency to lean on siderails in hallway when not using an AD, improved balance using RW, required VC's to take longer steps, no loss of balance   Stairs             Wheelchair Mobility    Modified Rankin (Stroke Patients Only)       Balance   Sitting-balance support: Feet supported;No upper extremity supported Sitting balance-Leahy Scale: Good     Standing balance support: Bilateral upper extremity supported;During functional activity Standing balance-Leahy Scale: Fair Standing balance comment: using RW                            Cognition Arousal/Alertness: Awake/alert Behavior During Therapy: WFL for tasks assessed/performed Overall Cognitive Status: History of cognitive impairments - at baseline(frequent episodes of confusion over last month per family possibly due to medications)                                        Exercises      General Comments        Pertinent Vitals/Pain Pain  Assessment: No/denies pain    Home Living                      Prior Function            PT Goals (current goals can now be found in the care plan section) Acute Rehab PT Goals Patient Stated Goal: return home PT Goal Formulation: With patient/family Time For Goal Achievement: 06/17/17 Potential to Achieve Goals: Good Progress towards PT goals: Progressing toward goals    Frequency    Min 5X/week      PT Plan Current plan remains appropriate    Co-evaluation              AM-PAC PT "6 Clicks" Daily  Activity  Outcome Measure  Difficulty turning over in bed (including adjusting bedclothes, sheets and blankets)?: None Difficulty moving from lying on back to sitting on the side of the bed? : None Difficulty sitting down on and standing up from a chair with arms (e.g., wheelchair, bedside commode, etc,.)?: None Help needed moving to and from a bed to chair (including a wheelchair)?: None Help needed walking in hospital room?: A Little Help needed climbing 3-5 steps with a railing? : A Little 6 Click Score: 22    End of Session   Activity Tolerance: Patient tolerated treatment well;Patient limited by fatigue Patient left: in chair;with call bell/phone within reach;with chair alarm set;with nursing/sitter in room Nurse Communication: Mobility status;Other (comment)(RN notified that patient was found wandering in hallway ) PT Visit Diagnosis: Unsteadiness on feet (R26.81);Other abnormalities of gait and mobility (R26.89);Muscle weakness (generalized) (M62.81)     Time: 9381-8299 PT Time Calculation (min) (ACUTE ONLY): 16 min  Charges:  $Therapeutic Activity: 8-22 mins                    G Codes:       1:58 PM, Jun 13, 2017 Lonell Grandchild, MPT Physical Therapist with [facility] [address] [phone] office [phone] mobile phone

## 2017-06-11 NOTE — Progress Notes (Signed)
Occupational Therapy Treatment Patient Details Name: Cassidy Smith MRN: [id number] DOB: 1955-12-12 Today's Date: 06/11/2017    History of present illness OLITA TAKESHITA is a 62 y.o. female with medical history significant for bipolar type I disorder, chronic SIADH, hypothyroidism, and GERD who is brought to the emergency department after a fall this morning where she struck her right thoracic back on the bedpost and sustained a laceration to her right rib cage.  She cannot accurately state whether or not she lost consciousness and what exactly caused her fall.  Family at the bedside state that they were quite concerned as the patient was noted to be quite lethargic with slurred speech and had significant gait abnormalities.   OT comments  Pt standing at bedside on OT entry reporting she wanted to get ready. Pt requiring min guard during standing ADLs for safety, supervision for seated ADL tasks. Pt continues to be unsteady during functional mobility tasks, using 1 person hand held assist and using wall/furniture for support as pt's SPC was unavailable-believes family may have taken home. HHOT services continue to be appropriate on discharge home with 24/7 supervision.    Follow Up Recommendations  Home health OT;Supervision/Assistance - 24 hour    Equipment Recommendations  None recommended by OT       Precautions / Restrictions Precautions Precautions: Fall       Mobility Bed Mobility               General bed mobility comments: Pt standing at bedside upon OT entry   Transfers Overall transfer level: Needs assistance Equipment used: 1 person hand held assist Transfers: Sit to/from [facility] Sit to Stand: Min assist Stand pivot transfers: Min guard       General transfer comment: unsteady on feet        ADL either performed or assessed with clinical judgement   ADL Overall ADL's : Needs assistance/impaired Eating/Feeding: Set  up;Sitting Eating/Feeding Details (indicate cue type and reason): Assist for opening packages. Pt able to spread jelly on her bread independently Grooming: Wash/dry hands;Supervision/safety;Min guard;Standing           Upper Body Dressing : Modified independent;Sitting Upper Body Dressing Details (indicate cue type and reason): Donned robe Lower Body Dressing: Supervision/safety;Sitting/lateral leans Lower Body Dressing Details (indicate cue type and reason): Doffed old pair of depends and donned new pair while seated on commode Toilet Transfer: Min guard;Grab Corporate investment banker- Clothing Manipulation and Hygiene: Supervision/safety;Sitting/lateral lean;Min guard;Sit to/from stand Toileting - Clothing Manipulation Details (indicate cue type and reason): Min guard when pulling depends up in standing     Functional mobility during ADLs: Min guard                 Cognition Arousal/Alertness: Awake/alert Behavior During Therapy: WFL for tasks assessed/performed Overall Cognitive Status: Within Functional Limits for tasks assessed                                                     Pertinent Vitals/ Pain       Pain Assessment: No/denies pain         Frequency  Min 2X/week        Progress Toward Goals  OT Goals(current goals can now be found in the care plan section)  Progress towards OT goals: Progressing toward  goals  Acute Rehab OT Goals Patient Stated Goal: return home OT Goal Formulation: With patient Time For Goal Achievement: 06/24/17 Potential to Achieve Goals: Good ADL Goals Pt Will Perform Grooming: with min guard assist;standing Pt Will Perform Upper Body Dressing: with modified independence;sitting Pt Will Perform Lower Body Dressing: with supervision;sitting/lateral leans;sit to/from stand Pt Will Transfer to Toilet: with min assist;stand pivot transfer;ambulating;regular height toilet;bedside commode Pt Will  Perform Toileting - Clothing Manipulation and hygiene: with min guard assist;sitting/lateral leans;sit to/from stand Pt/caregiver will Perform Home Exercise Program: Increased strength;Both right and left upper extremity;Independently;With written HEP provided  Plan Discharge plan remains appropriate          End of Session Equipment Utilized During Treatment: Gait belt  OT Visit Diagnosis: Unsteadiness on feet (R26.81);Muscle weakness (generalized) (M62.81);Repeated falls (R29.6)   Activity Tolerance Patient tolerated treatment well   Patient Left in chair;with call bell/phone within reach;with chair alarm set   Nurse Communication          Time: [phone] OT Time Calculation (min): 24 min  Charges: OT General Charges $OT Visit: 1 Visit OT Treatments $Self Care/Home Management : 23-37 mins    Guadelupe Sabin, OTR/L  [phone] 06/11/2017, 8:52 AM

## 2017-06-11 NOTE — Progress Notes (Signed)
*  PRELIMINARY RESULTS* Echocardiogram 2D Echocardiogram has been performed.  [name] 06/11/2017, 3:43 PM

## 2017-06-12 ENCOUNTER — Telehealth: Payer: Self-pay | Admitting: *Deleted

## 2017-06-12 DIAGNOSIS — I639 Cerebral infarction, unspecified: Secondary | ICD-10-CM

## 2017-06-12 NOTE — Telephone Encounter (Signed)
-----   Message from Barton Dubois, MD sent at 06/11/2017  1:56 PM EDT ----- Please arrange 30 days event monitoring for this patient. Thanks

## 2017-06-12 NOTE — Care Management (Signed)
Late Entry: Pt discharged home evening of 06/11/17. Tim, [facility] rep, aware of DC and will pull HH orders from chart. Pt/family aware HH has 48 hrs to resume services.

## 2017-06-16 ENCOUNTER — Ambulatory Visit (INDEPENDENT_AMBULATORY_CARE_PROVIDER_SITE_OTHER): Payer: Medicaid Other | Admitting: Orthopedic Surgery

## 2017-06-16 ENCOUNTER — Ambulatory Visit (INDEPENDENT_AMBULATORY_CARE_PROVIDER_SITE_OTHER): Payer: Medicaid Other

## 2017-06-16 ENCOUNTER — Encounter: Payer: Self-pay | Admitting: Orthopedic Surgery

## 2017-06-16 VITALS — BP 118/81 | HR 90 | Ht 65.0 in | Wt 107.0 lb

## 2017-06-16 DIAGNOSIS — Z967 Presence of other bone and tendon implants: Secondary | ICD-10-CM

## 2017-06-16 DIAGNOSIS — Z9889 Other specified postprocedural states: Secondary | ICD-10-CM

## 2017-06-16 DIAGNOSIS — Z8781 Personal history of (healed) traumatic fracture: Secondary | ICD-10-CM

## 2017-06-16 NOTE — Patient Instructions (Signed)
Continue to increase activity as tolerated  Follow-up 3 months

## 2017-06-16 NOTE — Progress Notes (Signed)
Chief Complaint  Patient presents with  . Follow-up    S/P ORIF Right Hip DOS 03/07/17, new fall 06/09/17     Follow-up after the postop.  No major complaints she did fall she was recently discharged from the hospital on the 29th  X-rays today show no fracture dislocation she does have a lot of heterotopic bone around the hip fracture looks healed  Active hip flexion is normal as of passive no pain at the fracture site leg lengths are equal and aligned properly  She is amatory with a cane  Recommend follow-up in 3 months for a clinical checkup no x-ray needed  Encounter Diagnosis  Name Primary?  . S/P ORIF (open reduction internal fixation) fracture right hip IM nail 03/07/17 Yes

## 2017-06-20 ENCOUNTER — Telehealth: Payer: Self-pay | Admitting: Gastroenterology

## 2017-06-20 NOTE — Telephone Encounter (Signed)
Please let patient know that I reviewed her CBC while in hospital recently so no additional labs needed now.   Also, when she was here last, she was having heartburn a lot and we didn't have her med list. I have reviewed recent med list from hospitalization and no evidence of PPI.   If she is still having heartburn frequently, we can try pantoprazole once daily before breakfast. #30, 3 refills.

## 2017-06-23 ENCOUNTER — Telehealth: Payer: Self-pay | Admitting: Orthopedic Surgery

## 2017-06-23 NOTE — Telephone Encounter (Signed)
Spoke with patient; aware. 

## 2017-06-23 NOTE — Telephone Encounter (Signed)
3 months is correct.

## 2017-06-23 NOTE — Telephone Encounter (Signed)
Patient called with questions - thought she may have been scheduled for appointment today. Last visit 06/16/17 summary notes indicate follow up in 3 months.  Please advise.

## 2017-06-23 NOTE — Telephone Encounter (Signed)
I called and spoke with pt's brother, Lorrin Goodell, who is her caregiver. He said the nurse has been out twice and found out the PPI was not on her list. They contacted PCP, Dr. Sherrie Sport and got prescription for the Pantoprazole 40 mg bid. Forwarding FYI to Roseanne Kaufman, NP.

## 2017-06-23 NOTE — Telephone Encounter (Signed)
Noted  

## 2017-06-30 ENCOUNTER — Encounter: Payer: Self-pay | Admitting: Gastroenterology

## 2017-06-30 NOTE — Telephone Encounter (Addendum)
Noted. Let's make her an OV in 4 months for follow up.

## 2017-06-30 NOTE — Telephone Encounter (Signed)
Noted, please schedule 4 month apt.

## 2017-06-30 NOTE — Telephone Encounter (Signed)
PATIENT SCHEDULED AND LETTER SENT  °

## 2017-07-10 DIAGNOSIS — C20 Malignant neoplasm of rectum: Secondary | ICD-10-CM | POA: Insufficient documentation

## 2017-07-10 DIAGNOSIS — I825Z9 Chronic embolism and thrombosis of unspecified deep veins of unspecified distal lower extremity: Secondary | ICD-10-CM

## 2017-07-10 HISTORY — DX: Chronic embolism and thrombosis of unspecified deep veins of unspecified distal lower extremity: I82.5Z9

## 2017-08-14 ENCOUNTER — Emergency Department (HOSPITAL_COMMUNITY): Payer: Medicaid Other

## 2017-08-14 ENCOUNTER — Inpatient Hospital Stay (HOSPITAL_COMMUNITY)
Admission: EM | Admit: 2017-08-14 | Discharge: 2017-08-17 | DRG: 071 | Disposition: A | Discharge status: Home Health | Payer: Medicaid Other | Attending: Internal Medicine | Admitting: Internal Medicine

## 2017-08-14 ENCOUNTER — Encounter (HOSPITAL_COMMUNITY): Payer: Self-pay | Admitting: Emergency Medicine

## 2017-08-14 ENCOUNTER — Other Ambulatory Visit: Payer: Self-pay

## 2017-08-14 DIAGNOSIS — E869 Volume depletion, unspecified: Secondary | ICD-10-CM | POA: Diagnosis present

## 2017-08-14 DIAGNOSIS — W19XXXA Unspecified fall, initial encounter: Secondary | ICD-10-CM | POA: Diagnosis present

## 2017-08-14 DIAGNOSIS — R400 Somnolence: Secondary | ICD-10-CM

## 2017-08-14 DIAGNOSIS — F419 Anxiety disorder, unspecified: Secondary | ICD-10-CM | POA: Diagnosis present

## 2017-08-14 DIAGNOSIS — E876 Hypokalemia: Secondary | ICD-10-CM | POA: Diagnosis present

## 2017-08-14 DIAGNOSIS — R4182 Altered mental status, unspecified: Secondary | ICD-10-CM | POA: Diagnosis present

## 2017-08-14 DIAGNOSIS — Z8659 Personal history of other mental and behavioral disorders: Secondary | ICD-10-CM

## 2017-08-14 DIAGNOSIS — K219 Gastro-esophageal reflux disease without esophagitis: Secondary | ICD-10-CM | POA: Diagnosis present

## 2017-08-14 DIAGNOSIS — M25559 Pain in unspecified hip: Secondary | ICD-10-CM | POA: Diagnosis present

## 2017-08-14 DIAGNOSIS — E222 Syndrome of inappropriate secretion of antidiuretic hormone: Secondary | ICD-10-CM | POA: Diagnosis present

## 2017-08-14 DIAGNOSIS — G9341 Metabolic encephalopathy: Secondary | ICD-10-CM

## 2017-08-14 DIAGNOSIS — E0590 Thyrotoxicosis, unspecified without thyrotoxic crisis or storm: Secondary | ICD-10-CM | POA: Diagnosis present

## 2017-08-14 DIAGNOSIS — T426X5A Adverse effect of other antiepileptic and sedative-hypnotic drugs, initial encounter: Secondary | ICD-10-CM | POA: Diagnosis present

## 2017-08-14 DIAGNOSIS — R509 Fever, unspecified: Secondary | ICD-10-CM | POA: Diagnosis present

## 2017-08-14 DIAGNOSIS — Z7982 Long term (current) use of aspirin: Secondary | ICD-10-CM

## 2017-08-14 DIAGNOSIS — N189 Chronic kidney disease, unspecified: Secondary | ICD-10-CM | POA: Diagnosis present

## 2017-08-14 DIAGNOSIS — T426X1A Poisoning by other antiepileptic and sedative-hypnotic drugs, accidental (unintentional), initial encounter: Secondary | ICD-10-CM

## 2017-08-14 DIAGNOSIS — F319 Bipolar disorder, unspecified: Secondary | ICD-10-CM | POA: Diagnosis present

## 2017-08-14 DIAGNOSIS — R251 Tremor, unspecified: Secondary | ICD-10-CM | POA: Diagnosis present

## 2017-08-14 DIAGNOSIS — G8929 Other chronic pain: Secondary | ICD-10-CM | POA: Diagnosis present

## 2017-08-14 DIAGNOSIS — R51 Headache: Secondary | ICD-10-CM | POA: Diagnosis present

## 2017-08-14 DIAGNOSIS — Z79899 Other long term (current) drug therapy: Secondary | ICD-10-CM

## 2017-08-14 DIAGNOSIS — Z8673 Personal history of transient ischemic attack (TIA), and cerebral infarction without residual deficits: Secondary | ICD-10-CM

## 2017-08-14 DIAGNOSIS — E039 Hypothyroidism, unspecified: Secondary | ICD-10-CM | POA: Diagnosis present

## 2017-08-14 DIAGNOSIS — E871 Hypo-osmolality and hyponatremia: Secondary | ICD-10-CM

## 2017-08-14 LAB — CBC WITH DIFFERENTIAL/PLATELET
Basophils Absolute: 0 10*3/uL (ref 0.0–0.1)
Basophils Relative: 0 %
EOS PCT: 0 %
Eosinophils Absolute: 0 10*3/uL (ref 0.0–0.7)
HEMATOCRIT: 38.7 % (ref 36.0–46.0)
Hemoglobin: 12.8 g/dL (ref 12.0–15.0)
LYMPHS PCT: 12 %
Lymphs Abs: 2.5 10*3/uL (ref 0.7–4.0)
MCH: 30.5 pg (ref 26.0–34.0)
MCHC: 33.1 g/dL (ref 30.0–36.0)
MCV: 92.4 fL (ref 78.0–100.0)
MONO ABS: 1.6 10*3/uL — AB (ref 0.1–1.0)
Monocytes Relative: 8 %
NEUTROS ABS: 16.7 10*3/uL — AB (ref 1.7–7.7)
Neutrophils Relative %: 80 %
PLATELETS: 156 10*3/uL (ref 150–400)
RBC: 4.19 MIL/uL (ref 3.87–5.11)
RDW: 14.8 % (ref 11.5–15.5)
WBC: 20.7 10*3/uL — ABNORMAL HIGH (ref 4.0–10.5)

## 2017-08-14 LAB — COMPREHENSIVE METABOLIC PANEL
ALK PHOS: 97 U/L (ref 38–126)
ALT: 11 U/L (ref 0–44)
AST: 15 U/L (ref 15–41)
Albumin: 3.2 g/dL — ABNORMAL LOW (ref 3.5–5.0)
Anion gap: 7 (ref 5–15)
BILIRUBIN TOTAL: 0.5 mg/dL (ref 0.3–1.2)
BUN: 8 mg/dL (ref 8–23)
CO2: 30 mmol/L (ref 22–32)
CREATININE: 0.57 mg/dL (ref 0.44–1.00)
Calcium: 9 mg/dL (ref 8.9–10.3)
Chloride: 91 mmol/L — ABNORMAL LOW (ref 98–111)
GFR calc Af Amer: >60 mL/min (ref >60)
Glucose, Bld: 125 mg/dL — ABNORMAL HIGH (ref 70–99)
Potassium: 3.4 mmol/L — ABNORMAL LOW (ref 3.5–5.1)
Sodium: 128 mmol/L — ABNORMAL LOW (ref 135–145)
TOTAL PROTEIN: 6.9 g/dL (ref 6.5–8.1)

## 2017-08-14 LAB — VALPROIC ACID LEVEL: VALPROIC ACID LVL: 124 ug/mL — AB (ref 50.0–100.0)

## 2017-08-14 LAB — AMMONIA: Ammonia: 31 umol/L (ref 9–35)

## 2017-08-14 MED ORDER — SODIUM CHLORIDE 0.9 % IV BOLUS
1000.0000 mL | Freq: Once | INTRAVENOUS | Status: AC
  Administered 2017-08-14: 1000 mL via INTRAVENOUS

## 2017-08-14 MED ORDER — ATORVASTATIN CALCIUM 10 MG PO TABS
20.00 | ORAL_TABLET | ORAL | Status: DC
Start: 2017-08-14 — End: 2017-08-14

## 2017-08-14 MED ORDER — TRAZODONE HCL 100 MG PO TABS
100.00 | ORAL_TABLET | ORAL | Status: DC
Start: ? — End: 2017-08-14

## 2017-08-14 MED ORDER — BENZTROPINE MESYLATE 1 MG PO TABS
1.00 | ORAL_TABLET | ORAL | Status: DC
Start: 2017-08-15 — End: 2017-08-14

## 2017-08-14 MED ORDER — OXYCODONE HCL 5 MG PO TABS
5.00 | ORAL_TABLET | ORAL | Status: DC
Start: ? — End: 2017-08-14

## 2017-08-14 MED ORDER — DIVALPROEX SODIUM ER 500 MG PO TB24
1500.00 | ORAL_TABLET | ORAL | Status: DC
Start: 2017-08-14 — End: 2017-08-14

## 2017-08-14 MED ORDER — METHIMAZOLE 10 MG PO TABS
10.00 | ORAL_TABLET | ORAL | Status: DC
Start: 2017-08-16 — End: 2017-08-14

## 2017-08-14 MED ORDER — PANTOPRAZOLE SODIUM 40 MG PO TBEC
40.00 | DELAYED_RELEASE_TABLET | ORAL | Status: DC
Start: 2017-08-15 — End: 2017-08-14

## 2017-08-14 MED ORDER — ASPIRIN EC 81 MG PO TBEC
81.00 | DELAYED_RELEASE_TABLET | ORAL | Status: DC
Start: 2017-08-15 — End: 2017-08-14

## 2017-08-14 MED ORDER — ACETAMINOPHEN 325 MG PO TABS
650.00 | ORAL_TABLET | ORAL | Status: DC
Start: 2017-08-14 — End: 2017-08-14

## 2017-08-14 MED ORDER — GABAPENTIN 100 MG PO CAPS
100.00 | ORAL_CAPSULE | ORAL | Status: DC
Start: 2017-08-14 — End: 2017-08-14

## 2017-08-14 MED ORDER — ENOXAPARIN SODIUM 40 MG/0.4ML ~~LOC~~ SOLN
40.00 | SUBCUTANEOUS | Status: DC
Start: 2017-08-15 — End: 2017-08-14

## 2017-08-14 NOTE — ED Triage Notes (Signed)
Per ems pt has polyp removed yesterday at [facility] and was d/c today, family told ems pt was sleepy and confused at home. Pt sleeping when arrived by ems.

## 2017-08-14 NOTE — ED Provider Notes (Signed)
[facility] EMERGENCY DEPARTMENT Provider Note   CSN: [id number] Arrival date & time: 08/14/17  2148     History   Chief Complaint Chief Complaint  Patient presents with  . Altered Mental Status    HPI Cassidy Smith is a 62 y.o. female.  The history is provided by a relative. The history is limited by the condition of the patient (Altered mental status).  She has history of stroke, bipolar disorder, SIADH, hypothyroidism and is brought to the ED because of decreased level of consciousness.  She had been admitted to [facility] yesterday for removal of a colonic polyp and was kept in the hospital overnight.  She was discharged this morning after being able to urinate.  Family member states that she has not returned to her baseline mental state since the procedure, and seems to be getting worse.  She is normally able to converse and walk and feed herself and she is not able to do any of these things currently.  There has been no known fever.  She has been taking all of her medications.  Past Medical History:  Diagnosis Date  . Anxiety   . Bipolar 1 disorder ([place])   . Chronic hip pain   . Chronic kidney disease    uti  currently,  hx bladder spasms  . Depression   . Headache(784.0)   . Hyperthyroidism   . Ischemic stroke ([place])   . Neuromuscular disorder (HCC)    shaking of hands   . SIADH (syndrome of inappropriate ADH production) ([place]) 04/08/2017    Patient Active Problem List   Diagnosis Date Noted  . Altered mental status   . Ischemic stroke ([place])   . GERD (gastroesophageal reflux disease) 05/12/2017  . GI bleed   . SIADH (syndrome of inappropriate ADH production) ([place]) 04/08/2017  . Dysplastic rectal polyp   . Protein-calorie malnutrition, severe 04/07/2017  . Pressure injury of skin 04/04/2017  . Anticoagulated 04/03/2017  . Stool bloody 04/03/2017  . Current every day smoker 04/03/2017  . Rectal bleeding   . Chronic diarrhea   .  Hyperthyroidism   . S/P ORIF (open reduction internal fixation) fracture right hip IM nail 03/07/17 03/24/2017  . Hypomagnesemia 03/07/2017  . Fall   . Closed intertrochanteric fracture of hip, right, initial encounter ([place]) 03/06/2017  . Radial styloid fracture: right 03/06/2017  . Anemia 03/06/2017  . Orthostatic hypotension 06/07/2015  . UTI (urinary tract infection) 06/07/2015  . Rhabdomyolysis 06/07/2015  . White matter abnormality on MRI of brain 05/14/2013  . History of depression 05/14/2013  . Hx of anxiety disorder 05/14/2013  . Bipolar I disorder, most recent episode (or current) manic, unspecified 05/14/2013  . Bipolar 1 disorder ([place]) 05/14/2013  . Hyponatremia 05/12/2013  . Bipolar disorder ([place]) 05/12/2013  . Lacunar infarct, acute ([place]) 05/12/2013    Past Surgical History:  Procedure Laterality Date  . COLONOSCOPY WITH PROPOFOL N/A 04/05/2017   Procedure: COLONOSCOPY WITH PROPOFOL;  Surgeon: Rogene Houston, MD;  Location: [facility] ENDO SUITE;  Service: Endoscopy;  Laterality: N/A;  . INTRAMEDULLARY (IM) NAIL INTERTROCHANTERIC Right 03/07/2017   Procedure: OPEN TREATMENT INTERNAL FIXATION RIGHT HIP WITH GAMA INTRAMEDULARY NAIL;  Surgeon: Carole Civil, MD;  Location: [facility] ORS;  Service: Orthopedics;  Laterality: Right;  . MULTIPLE EXTRACTIONS WITH ALVEOLOPLASTY N/A 10/30/2012   Procedure: MULTIPLE EXTRACION 5, 6, 8, 9, 10 ,18, 19, 31 WITH MAXILLARY RIGHT AND LEFT  ALVEOLOPLASTY REDUCE MAXILLARY LEFT TUBEROSITY;  Surgeon: Nicki Reaper  Parthenia Ames, DDS;  Location: [place];  Service: Oral Surgery;  Laterality: N/A;  . POLYPECTOMY  04/05/2017   Procedure: POLYPECTOMY;  Surgeon: Rogene Houston, MD;  Location: [facility] ENDO SU[facility];  Service: Endoscopy;;  recto-sigmoid, rectum     OB History   None      Home Medications    Prior to Admission medications   Medication Sig Start Date End Date Taking? Authorizing Provider  aspirin EC 325 MG tablet Take 1 tablet (325 mg total) by mouth daily.  06/11/17 06/11/18  Barton Dubois, MD  atorvastatin (LIPITOR) 20 MG tablet Take 1 tablet (20 mg total) by mouth daily at 6 PM. 06/11/17   Barton Dubois, MD  benztropine (COGENTIN) 1 MG tablet Take 1 tablet (1 mg total) by mouth daily. 06/11/17   Barton Dubois, MD  Calcium Carb-Cholecalciferol [phone] MG-UNIT CAPS Take 1 capsule by mouth daily.    [provider]  demeclocycline (DECLOMYCIN) 150 MG tablet Take 2 tablets (300 mg total) by mouth every 12 (twelve) hours. 06/11/17   Barton Dubois, MD  divalproex (DEPAKOTE ER) 500 MG 24 hr tablet Take 1,500 mg by mouth at bedtime.     [provider]  furosemide (LASIX) 20 MG tablet Take 1 tablet (20 mg total) by mouth daily. 04/18/17   Barton Dubois, MD  gabapentin (NEURONTIN) 100 MG capsule Take 1 capsule (100 mg total) by mouth 3 (three) times daily. Patient taking differently: Take 300 mg by mouth 2 (two) times daily.  05/13/17   Carole Civil, MD  methimazole (TAPAZOLE) 10 MG tablet Take 10 mg by mouth every other day.  12/24/15   [provider]  Multiple Vitamins-Minerals (CENTRUM SILVER ADULT 50+ PO) Take 1 tablet by mouth every morning.    [provider]  nicotine (NICODERM CQ - DOSED IN MG/24 HOURS) 21 mg/24hr patch Place 21 mg onto the skin daily.    [provider]  potassium chloride (K-DUR,KLOR-CON) 10 MEQ tablet Take 10 mEq by mouth daily.     [provider]  sodium chloride 1 g tablet Take 2 tablets (2 g total) by mouth 3 (three) times daily with meals. Patient taking differently: Take 1 g by mouth 3 (three) times daily with meals.  04/17/17   Barton Dubois, MD  traZODone (DESYREL) 100 MG tablet Take 200 mg by mouth at bedtime.    [provider]  vitamin B-12 (CYANOCOBALAMIN) 1000 MCG tablet Take 1 tablet (1,000 mcg total) by mouth daily. 06/11/17   Barton Dubois, MD    Family History Family History  Problem Relation Age of Onset  . Breast cancer Mother   . Cancer -  Other Mother   . Alcoholism Father   . Colon cancer Neg Hx   . Colon polyps Neg Hx     Social History Social History   Tobacco Use  . Smoking status: Former Smoker    Packs/day: 0.50    Years: 20.00    Pack years: 10.00    Types: Cigarettes    Last attempt to quit: 05/05/2017    Years since quitting: 0.2  . Smokeless tobacco: Never Used  . Tobacco comment: patient states she quit one week ago  Substance Use Topics  . Alcohol use: No  . Drug use: No     Allergies   Patient has no known allergies.   Review of Systems Review of Systems  Unable to perform ROS: Mental status change     Physical Exam Updated Vital  Signs BP 124/69 (BP Location: Left Arm)   Pulse 79   Temp (!) 100.8 F (38.2 C) (Rectal)   Resp 15   Wt 48.5 kg (107 lb)   SpO2 92%   BMI 17.81 kg/m   Physical Exam  Nursing note and vitals reviewed.  62 year old female, resting comfortably and in no acute distress. Vital signs are normal. Oxygen saturation is 92%, which is normal. Head is normocephalic and atraumatic. PERRLA, EOMI. Oropharynx is clear.  Mucous membranes are dry. Neck is nontender and supple without adenopathy or JVD. Back is nontender and there is no CVA tenderness. Lungs are clear without rales, wheezes, or rhonchi. Chest is nontender. Heart has regular rate and rhythm without murmur. Abdomen is soft, flat, nontender without masses or hepatosplenomegaly and peristalsis is normoactive. Extremities have no cyanosis or edema, full range of motion is present. Skin is warm and dry without rash.  Decreased skin turgor noted. Neurologic: Somnolent but arousable, oriented to person and place but not time, follows commands well, cranial nerves are intact, there are no gross motor or sensory deficits.  ED Treatments / Results  Labs (all labs ordered are listed, but only abnormal results are displayed) Labs Reviewed  CBC WITH DIFFERENTIAL/PLATELET - Abnormal; Notable for the following  components:      Result Value   WBC 20.7 (*)    Neutro Abs 16.7 (*)    Monocytes Absolute 1.6 (*)    All other components within normal limits  COMPREHENSIVE METABOLIC PANEL - Abnormal; Notable for the following components:   Sodium 128 (*)    Potassium 3.4 (*)    Chloride 91 (*)    Glucose, Bld 125 (*)    Albumin 3.2 (*)    All other components within normal limits  VALPROIC ACID LEVEL - Abnormal; Notable for the following components:   Valproic Acid Lvl 124 (*)    All other components within normal limits  MAGNESIUM - Abnormal; Notable for the following components:   Magnesium 1.5 (*)    All other components within normal limits  CBC WITH DIFFERENTIAL/PLATELET - Abnormal; Notable for the following components:   WBC 17.8 (*)    RBC 3.51 (*)    Hemoglobin 10.6 (*)    HCT 32.4 (*)    Platelets 135 (*)    Neutro Abs 14.1 (*)    Monocytes Absolute 1.3 (*)    All other components within normal limits  COMPREHENSIVE METABOLIC PANEL - Abnormal; Notable for the following components:   Sodium 134 (*)    BUN 6 (*)    Creatinine, Ser 0.43 (*)    Calcium 8.3 (*)    Total Protein 5.2 (*)    Albumin 2.4 (*)    AST 10 (*)    Anion gap 4 (*)    All other components within normal limits  GLUCOSE, CAPILLARY - Abnormal; Notable for the following components:   Glucose-Capillary 100 (*)    All other components within normal limits  GLUCOSE, CAPILLARY - Abnormal; Notable for the following components:   Glucose-Capillary 121 (*)    All other components within normal limits  CULTURE, BLOOD (ROUTINE X 2)  CULTURE, BLOOD (ROUTINE X 2)  MRSA PCR SCREENING  URINALYSIS, ROUTINE W REFLEX MICROSCOPIC  AMMONIA  PHOSPHORUS  TSH  CBC WITH DIFFERENTIAL/PLATELET  BASIC METABOLIC PANEL  CBC  VALPROIC ACID LEVEL  MAGNESIUM  I-STAT CG4 LACTIC ACID, ED  I-STAT CG4 LACTIC ACID, ED    Radiology Ct Head Wo  Contrast  Result Date: 08/15/2017 CLINICAL DATA:  Somnolent. Status post polyp removed  yesterday. History of headache, stroke. EXAM: CT HEAD WITHOUT CONTRAST TECHNIQUE: Contiguous axial images were obtained from the base of the skull through the vertex without intravenous contrast. COMPARISON:  MRI of the head Jun 10, 2017 FINDINGS: BRAIN: No intraparenchymal hemorrhage, mass effect nor midline shift. Moderate to severe parenchymal brain volume loss. Patchy supratentorial white matter hypodensities within normal range for patient's age, though non-specific are most compatible with chronic small vessel ischemic disease. Old small RIGHT cerebellar infarct. No acute large vascular territory infarcts. No abnormal extra-axial fluid collections. Basal cisterns are patent. VASCULAR: Mild calcific atherosclerosis of the carotid siphons. SKULL: No skull fracture. No significant scalp soft tissue swelling. SINUSES/ORBITS: The mastoid air-cells and included paranasal sinuses are well-aerated.The included ocular globes and orbital contents are non-suspicious. OTHER: None. IMPRESSION: 1. No acute intracranial process. 2. Moderate to severe parenchymal brain volume loss. 3. Mild chronic small vessel ischemic changes. Old small RIGHT cerebellar infarct. Electronically Signed   By: Elon Alas M.D.   On: 08/15/2017 00:04   Dg Chest Port 1 View  Result Date: 08/14/2017 CLINICAL DATA:  62 y/o  F; altered mental status. EXAM: PORTABLE CHEST 1 VIEW COMPARISON:  04/05/2017 chest radiograph. FINDINGS: Stable heart size and mediastinal contours are within normal limits. Both lungs are clear. The visualized skeletal structures are unremarkable. IMPRESSION: No active disease. Electronically Signed   By: Kristine Garbe M.D.   On: 08/14/2017 22:58    Procedures Procedures   Medications Ordered in ED Medications  0.9 % NaCl with KCl 20 mEq/ L  infusion ( Intravenous New Bag/Given 08/15/17 0149)  methimazole (TAPAZOLE) tablet 10 mg (10 mg Oral Given 08/15/17 0944)  potassium chloride (K-DUR,KLOR-CON) CR  tablet 10 mEq (10 mEq Oral Given 08/15/17 0945)  sodium chloride tablet 1 g (1 g Oral Given 08/15/17 1735)  gabapentin (NEURONTIN) capsule 100 mg (100 mg Oral Given 08/15/17 2215)  calcium-vitamin D (OSCAL WITH D) 500-200 MG-UNIT per tablet (1 tablet Oral Given 08/15/17 0946)  demeclocycline (DECLOMYCIN) tablet 300 mg (300 mg Oral Given 08/15/17 0944)  benztropine (COGENTIN) tablet 1 mg (1 mg Oral Given 08/15/17 0942)  atorvastatin (LIPITOR) tablet 20 mg (20 mg Oral Given 08/15/17 1735)  vitamin B-12 (CYANOCOBALAMIN) tablet 1,000 mcg (1,000 mcg Oral Given 08/15/17 0945)  pantoprazole (PROTONIX) EC tablet 40 mg (40 mg Oral Given 08/15/17 0942)  nicotine (NICODERM CQ - dosed in mg/24 hours) patch 21 mg (21 mg Transdermal Patch Applied 08/15/17 0946)  enoxaparin (LOVENOX) injection 40 mg (40 mg Subcutaneous Given 08/15/17 0800)  ondansetron (ZOFRAN) tablet 4 mg (has no administration in time range)    Or  ondansetron (ZOFRAN) injection 4 mg (has no administration in time range)  traZODone (DESYREL) tablet 100 mg (has no administration in time range)  piperacillin-tazobactam (ZOSYN) IVPB 3.375 g (3.375 g Intravenous New Bag/Given 08/15/17 2213)  vancomycin (VANCOCIN) IVPB 1000 mg/200 mL premix (has no administration in time range)  0.9 % NaCl with KCl 20 mEq/ L  infusion ( Intravenous New Bag/Given 08/15/17 2213)  sodium chloride 0.9 % bolus 1,000 mL (0 mLs Intravenous Stopped 08/15/17 0108)  piperacillin-tazobactam (ZOSYN) IVPB 3.375 g (0 g Intravenous Stopped 08/15/17 0149)  vancomycin (VANCOCIN) IVPB 1000 mg/200 mL premix (0 mg Intravenous Stopped 08/15/17 0240)  magnesium sulfate IVPB 4 g 100 mL (0 g Intravenous Stopped 08/15/17 0615)     Initial Impression / Assessment and Plan / ED Course  I  have reviewed the triage vital signs and the nursing notes.  Pertinent labs & imaging results that were available during my care of the patient were reviewed by me and considered in my medical decision making (see chart for  details).  Altered mental status following endoscopic resection of colon polyp.  Possible residual effects of anesthesia.  Old records are reviewed confirming recent admission to [facility].  Also, hospital admission 2 months ago for a fall and altered mentation and found to have a lacunar infarct on MRI.  History of hyponatremia, consider recurrent hyponatremia.  Last sodium on record was 130 on July 19 at [facility].  Will check CT of the head, check chest x-ray and urinalysis to look for occult infection.  Chest x-ray and CT of head showed no acute process.  WBC is come back very elevated which may be stress reaction and may indicate infection.  Sodium level has come back at 128 which is not low enough to account for altered mentation.  Urinalysis is pending at this time.  It is noted that she is running a low-grade fever, so sepsis labs are obtained and she is started on antibiotics.  Case is discussed with Dr. Olevia Bowens of [facility], who agrees to admit the patient.  Final Clinical Impressions(s) / ED Diagnoses   Final diagnoses:  Somnolence  Valproic acid toxicity, accidental or unintentional, initial encounter  Hyponatremia    ED Discharge Orders    None       Delora Fuel, MD 14/48/18 2253

## 2017-08-15 ENCOUNTER — Other Ambulatory Visit: Payer: Self-pay

## 2017-08-15 ENCOUNTER — Inpatient Hospital Stay (HOSPITAL_COMMUNITY): Payer: Medicaid Other

## 2017-08-15 DIAGNOSIS — E222 Syndrome of inappropriate secretion of antidiuretic hormone: Secondary | ICD-10-CM

## 2017-08-15 DIAGNOSIS — R509 Fever, unspecified: Secondary | ICD-10-CM | POA: Diagnosis present

## 2017-08-15 DIAGNOSIS — T426X5A Adverse effect of other antiepileptic and sedative-hypnotic drugs, initial encounter: Secondary | ICD-10-CM | POA: Diagnosis present

## 2017-08-15 DIAGNOSIS — G9341 Metabolic encephalopathy: Secondary | ICD-10-CM

## 2017-08-15 DIAGNOSIS — Z8673 Personal history of transient ischemic attack (TIA), and cerebral infarction without residual deficits: Secondary | ICD-10-CM | POA: Diagnosis not present

## 2017-08-15 DIAGNOSIS — K219 Gastro-esophageal reflux disease without esophagitis: Secondary | ICD-10-CM | POA: Diagnosis present

## 2017-08-15 DIAGNOSIS — T426X1A Poisoning by other antiepileptic and sedative-hypnotic drugs, accidental (unintentional), initial encounter: Secondary | ICD-10-CM

## 2017-08-15 DIAGNOSIS — N189 Chronic kidney disease, unspecified: Secondary | ICD-10-CM | POA: Diagnosis present

## 2017-08-15 DIAGNOSIS — R51 Headache: Secondary | ICD-10-CM | POA: Diagnosis present

## 2017-08-15 DIAGNOSIS — R400 Somnolence: Secondary | ICD-10-CM

## 2017-08-15 DIAGNOSIS — E0590 Thyrotoxicosis, unspecified without thyrotoxic crisis or storm: Secondary | ICD-10-CM

## 2017-08-15 DIAGNOSIS — E039 Hypothyroidism, unspecified: Secondary | ICD-10-CM | POA: Diagnosis present

## 2017-08-15 DIAGNOSIS — F319 Bipolar disorder, unspecified: Secondary | ICD-10-CM

## 2017-08-15 DIAGNOSIS — M25559 Pain in unspecified hip: Secondary | ICD-10-CM | POA: Diagnosis present

## 2017-08-15 DIAGNOSIS — W19XXXA Unspecified fall, initial encounter: Secondary | ICD-10-CM | POA: Diagnosis present

## 2017-08-15 DIAGNOSIS — Z7982 Long term (current) use of aspirin: Secondary | ICD-10-CM | POA: Diagnosis not present

## 2017-08-15 DIAGNOSIS — E876 Hypokalemia: Secondary | ICD-10-CM

## 2017-08-15 DIAGNOSIS — G8929 Other chronic pain: Secondary | ICD-10-CM | POA: Diagnosis present

## 2017-08-15 DIAGNOSIS — R5082 Postprocedural fever: Secondary | ICD-10-CM | POA: Diagnosis not present

## 2017-08-15 DIAGNOSIS — R251 Tremor, unspecified: Secondary | ICD-10-CM | POA: Diagnosis present

## 2017-08-15 DIAGNOSIS — E869 Volume depletion, unspecified: Secondary | ICD-10-CM | POA: Diagnosis present

## 2017-08-15 DIAGNOSIS — R4182 Altered mental status, unspecified: Secondary | ICD-10-CM | POA: Diagnosis present

## 2017-08-15 DIAGNOSIS — Z79899 Other long term (current) drug therapy: Secondary | ICD-10-CM | POA: Diagnosis not present

## 2017-08-15 DIAGNOSIS — F419 Anxiety disorder, unspecified: Secondary | ICD-10-CM | POA: Diagnosis present

## 2017-08-15 HISTORY — DX: Hypokalemia: E87.6

## 2017-08-15 LAB — URINALYSIS, ROUTINE W REFLEX MICROSCOPIC
BILIRUBIN URINE: NEGATIVE
GLUCOSE, UA: NEGATIVE mg/dL
HGB URINE DIPSTICK: NEGATIVE
Ketones, ur: NEGATIVE mg/dL
Leukocytes, UA: NEGATIVE
Nitrite: NEGATIVE
Protein, ur: NEGATIVE mg/dL
Specific Gravity, Urine: 1.012 (ref 1.005–1.030)
pH: 6 (ref 5.0–8.0)

## 2017-08-15 LAB — TSH: TSH: 0.589 u[IU]/mL (ref 0.350–4.500)

## 2017-08-15 LAB — CBC WITH DIFFERENTIAL/PLATELET
BASOS PCT: 0 %
Basophils Absolute: 0 10*3/uL (ref 0.0–0.1)
EOS ABS: 0 10*3/uL (ref 0.0–0.7)
EOS PCT: 0 %
HCT: 32.4 % — ABNORMAL LOW (ref 36.0–46.0)
Hemoglobin: 10.6 g/dL — ABNORMAL LOW (ref 12.0–15.0)
Lymphocytes Relative: 14 %
Lymphs Abs: 2.5 10*3/uL (ref 0.7–4.0)
MCH: 30.2 pg (ref 26.0–34.0)
MCHC: 32.7 g/dL (ref 30.0–36.0)
MCV: 92.3 fL (ref 78.0–100.0)
MONO ABS: 1.3 10*3/uL — AB (ref 0.1–1.0)
MONOS PCT: 7 %
NEUTROS PCT: 79 %
Neutro Abs: 14.1 10*3/uL — ABNORMAL HIGH (ref 1.7–7.7)
PLATELETS: 135 10*3/uL — AB (ref 150–400)
RBC: 3.51 MIL/uL — ABNORMAL LOW (ref 3.87–5.11)
RDW: 15 % (ref 11.5–15.5)
WBC: 17.8 10*3/uL — ABNORMAL HIGH (ref 4.0–10.5)

## 2017-08-15 LAB — GLUCOSE, CAPILLARY
GLUCOSE-CAPILLARY: 100 mg/dL — AB (ref 70–99)
Glucose-Capillary: 121 mg/dL — ABNORMAL HIGH (ref 70–99)

## 2017-08-15 LAB — COMPREHENSIVE METABOLIC PANEL
ALBUMIN: 2.4 g/dL — AB (ref 3.5–5.0)
ALT: 8 U/L (ref 0–44)
ANION GAP: 4 — AB (ref 5–15)
AST: 10 U/L — ABNORMAL LOW (ref 15–41)
Alkaline Phosphatase: 72 U/L (ref 38–126)
BUN: 6 mg/dL — ABNORMAL LOW (ref 8–23)
CO2: 28 mmol/L (ref 22–32)
Calcium: 8.3 mg/dL — ABNORMAL LOW (ref 8.9–10.3)
Chloride: 102 mmol/L (ref 98–111)
Creatinine, Ser: 0.43 mg/dL — ABNORMAL LOW (ref 0.44–1.00)
GFR calc non Af Amer: >60 mL/min (ref >60)
GLUCOSE: 97 mg/dL (ref 70–99)
POTASSIUM: 3.8 mmol/L (ref 3.5–5.1)
SODIUM: 134 mmol/L — AB (ref 135–145)
Total Bilirubin: 0.8 mg/dL (ref 0.3–1.2)
Total Protein: 5.2 g/dL — ABNORMAL LOW (ref 6.5–8.1)

## 2017-08-15 LAB — I-STAT CG4 LACTIC ACID, ED: Lactic Acid, Venous: 1.19 mmol/L (ref 0.5–1.9)

## 2017-08-15 LAB — MAGNESIUM: Magnesium: 1.5 mg/dL — ABNORMAL LOW (ref 1.7–2.4)

## 2017-08-15 LAB — PHOSPHORUS: Phosphorus: 2.5 mg/dL (ref 2.5–4.6)

## 2017-08-15 LAB — MRSA PCR SCREENING: MRSA by PCR: NEGATIVE

## 2017-08-15 MED ORDER — VANCOMYCIN HCL IN DEXTROSE 1-5 GM/200ML-% IV SOLN
1000.0000 mg | INTRAVENOUS | Status: DC
  Administered 2017-08-16: 1000 mg via INTRAVENOUS
  Filled 2017-08-15: qty 200

## 2017-08-15 MED ORDER — ATORVASTATIN CALCIUM 20 MG PO TABS
20.0000 mg | ORAL_TABLET | Freq: Every day | ORAL | Status: DC
  Administered 2017-08-15 – 2017-08-16 (×2): 20 mg via ORAL
  Filled 2017-08-15 (×2): qty 1

## 2017-08-15 MED ORDER — POTASSIUM CHLORIDE IN NACL 20-0.9 MEQ/L-% IV SOLN
INTRAVENOUS | Status: DC
  Administered 2017-08-15: 03:00:00 via INTRAVENOUS

## 2017-08-15 MED ORDER — METHIMAZOLE 5 MG PO TABS
10.0000 mg | ORAL_TABLET | ORAL | Status: DC
  Administered 2017-08-15 – 2017-08-17 (×2): 10 mg via ORAL
  Filled 2017-08-15: qty 1
  Filled 2017-08-15 (×2): qty 2
  Filled 2017-08-15: qty 1

## 2017-08-15 MED ORDER — POTASSIUM CHLORIDE IN NACL 20-0.9 MEQ/L-% IV SOLN
INTRAVENOUS | Status: DC
  Administered 2017-08-15: 22:00:00 via INTRAVENOUS

## 2017-08-15 MED ORDER — POTASSIUM CHLORIDE IN NACL 20-0.9 MEQ/L-% IV SOLN
INTRAVENOUS | Status: AC
  Administered 2017-08-15: 02:00:00 via INTRAVENOUS
  Filled 2017-08-15: qty 1000

## 2017-08-15 MED ORDER — PANTOPRAZOLE SODIUM 40 MG PO TBEC
40.0000 mg | DELAYED_RELEASE_TABLET | Freq: Every day | ORAL | Status: DC
  Administered 2017-08-15 – 2017-08-17 (×3): 40 mg via ORAL
  Filled 2017-08-15 (×3): qty 1

## 2017-08-15 MED ORDER — ENOXAPARIN SODIUM 40 MG/0.4ML ~~LOC~~ SOLN
40.0000 mg | SUBCUTANEOUS | Status: DC
  Administered 2017-08-15 – 2017-08-17 (×3): 40 mg via SUBCUTANEOUS
  Filled 2017-08-15 (×3): qty 0.4

## 2017-08-15 MED ORDER — GABAPENTIN 100 MG PO CAPS
100.0000 mg | ORAL_CAPSULE | Freq: Three times a day (TID) | ORAL | Status: DC
  Administered 2017-08-15 – 2017-08-17 (×7): 100 mg via ORAL
  Filled 2017-08-15 (×7): qty 1

## 2017-08-15 MED ORDER — ONDANSETRON HCL 4 MG/2ML IJ SOLN: 4.0000 mg | Freq: Four times a day (QID) | INTRAMUSCULAR | Status: DC | PRN

## 2017-08-15 MED ORDER — POTASSIUM CHLORIDE CRYS ER 10 MEQ PO TBCR
10.0000 meq | EXTENDED_RELEASE_TABLET | Freq: Every day | ORAL | Status: DC
  Administered 2017-08-15 – 2017-08-17 (×3): 10 meq via ORAL
  Filled 2017-08-15 (×3): qty 1

## 2017-08-15 MED ORDER — TRAZODONE HCL 50 MG PO TABS: 100.0000 mg | ORAL_TABLET | Freq: Every day | ORAL | Status: DC

## 2017-08-15 MED ORDER — SODIUM CHLORIDE 1 G PO TABS
1.0000 g | ORAL_TABLET | Freq: Three times a day (TID) | ORAL | Status: DC
  Administered 2017-08-15 – 2017-08-17 (×8): 1 g via ORAL
  Filled 2017-08-15 (×8): qty 1

## 2017-08-15 MED ORDER — ONDANSETRON HCL 4 MG PO TABS: 4.0000 mg | ORAL_TABLET | Freq: Four times a day (QID) | ORAL | Status: DC | PRN

## 2017-08-15 MED ORDER — TRAZODONE HCL 50 MG PO TABS: 100.0000 mg | ORAL_TABLET | Freq: Every evening | ORAL | Status: DC | PRN

## 2017-08-15 MED ORDER — CALCIUM CARBONATE-VITAMIN D 500-200 MG-UNIT PO TABS
ORAL_TABLET | Freq: Every day | ORAL | Status: DC
  Administered 2017-08-15 – 2017-08-17 (×3): 1 via ORAL
  Filled 2017-08-15 (×3): qty 1

## 2017-08-15 MED ORDER — MAGNESIUM SULFATE 4 GM/100ML IV SOLN
4.0000 g | Freq: Once | INTRAVENOUS | Status: AC
  Administered 2017-08-15: 4 g via INTRAVENOUS
  Filled 2017-08-15: qty 100

## 2017-08-15 MED ORDER — NICOTINE 21 MG/24HR TD PT24
21.0000 mg | MEDICATED_PATCH | Freq: Every day | TRANSDERMAL | Status: DC
  Administered 2017-08-15 – 2017-08-17 (×3): 21 mg via TRANSDERMAL
  Filled 2017-08-15 (×3): qty 1

## 2017-08-15 MED ORDER — PIPERACILLIN-TAZOBACTAM 3.375 G IVPB
3.3750 g | Freq: Three times a day (TID) | INTRAVENOUS | Status: DC
  Administered 2017-08-15 – 2017-08-17 (×6): 3.375 g via INTRAVENOUS
  Filled 2017-08-15 (×16): qty 50

## 2017-08-15 MED ORDER — DEMECLOCYCLINE HCL 150 MG PO TABS
300.0000 mg | ORAL_TABLET | Freq: Two times a day (BID) | ORAL | Status: DC
  Administered 2017-08-15 – 2017-08-16 (×3): 300 mg via ORAL
  Filled 2017-08-15 (×9): qty 2

## 2017-08-15 MED ORDER — VITAMIN B-12 1000 MCG PO TABS
1000.0000 ug | ORAL_TABLET | Freq: Every day | ORAL | Status: DC
  Administered 2017-08-15 – 2017-08-17 (×3): 1000 ug via ORAL
  Filled 2017-08-15 (×3): qty 1

## 2017-08-15 MED ORDER — BENZTROPINE MESYLATE 1 MG PO TABS
1.0000 mg | ORAL_TABLET | Freq: Every day | ORAL | Status: DC
  Administered 2017-08-15 – 2017-08-17 (×3): 1 mg via ORAL
  Filled 2017-08-15 (×3): qty 1

## 2017-08-15 MED ORDER — VANCOMYCIN HCL IN DEXTROSE 1-5 GM/200ML-% IV SOLN
1000.0000 mg | Freq: Once | INTRAVENOUS | Status: AC
  Administered 2017-08-15: 1000 mg via INTRAVENOUS
  Filled 2017-08-15: qty 200

## 2017-08-15 MED ORDER — PIPERACILLIN-TAZOBACTAM 3.375 G IVPB 30 MIN
3.3750 g | Freq: Once | INTRAVENOUS | Status: AC
  Administered 2017-08-15: 3.375 g via INTRAVENOUS
  Filled 2017-08-15: qty 50

## 2017-08-15 NOTE — Progress Notes (Signed)
PROGRESS NOTE  Cassidy Smith RDE:[id number] DOB: [birth year] DOA: 08/14/2017 PCP: Neale Burly, MD  Brief History:  62 year old female with a history of bipolar disorder, chronic pain, stroke, SIADH, postoperative DVT after her hip surgery February 2019, rectal cancer presenting with increasing somnolence and altered mental status.  Patient was recently discharged from [facility] on 08/14/2017 after she was monitored overnight post transanal endoscopic microsurgery for her high-grade rectal polyp.  According to the patient's cousin, the patient was very somnolent and difficult to arouse.  Nevertheless, the patient was aroused and was given her medications in crushed form on 08/14/2017.  There is no new changes in her medications.  There is no reports of respiratory distress, vomiting, diarrhea, headaches.  Upon presentation, the patient had a temperature 100.8 F but was hemodynamically stable.  WBC was 20.7 at the time of admission.  Because of altered mental status, the patient was admitted for further evaluation and treatment.  CT of the brain initially was negative.  Assessment/Plan: Acute metabolic encephalopathy -Multifactorial including residual effects of anesthesia, supratherapeutic valproic acid, hyponatremia, opioids per surgery, and possible infectious process -Ammonia 31 -VPA level 124 -Personally reviewed EKG--sinus rhythm, nonspecific T wave change -08/15/2017 TSH 0.589 -Check serum U31 -Check folic acid -Patient is more alert but remains confused -At baseline, the patient is able to perform all her ADLs, and is alert and oriented x2 -MRI brain  Depakote toxicity -Holding Depakote -Repeat Depakote level   Fever/leukocytosis -Urinalysis negative for pyuria -Personally reviewed chest x-ray--no infiltrates or edema -Continue vancomycin and Zosyn pending culture data  Hyponatremia -Partly due to volume depletion -Continue demeclocycline for  SIADH  Hypomagnesemia -Replete  Hyperthyroidism -Continue Tapazole -TSH 0.589  Bipolar disorder -Continue Cogentin -Holding valproic acid as discussed      Disposition Plan:   Home vs SNF 1-2 days  Family Communication:  Cousin updated on phone--Total time spent 35 minutes.  Greater than 50% spent face to face counseling and coordinating care. 0830 to [place]   Consultants:  none  Code Status:  FULL   DVT Prophylaxis:  [place] Heparin / [place] Lovenox   Procedures: As Listed in Progress Note Above  Antibiotics: None    Subjective: Patient is pleasantly confused.  She denies any chest pain, shortness breath, headache, nausea, vomiting, diarrhea.  Objective: Vitals:   08/15/17 0500 08/15/17 0600 08/15/17 0700 08/15/17 0800  BP: (!) 106/53 134/64 126/66 136/73  Pulse: 74 76 76 82  Resp: 20 19 (!) 21 20  Temp:      TempSrc:      SpO2: 92% 94% 94% 94%  Weight:      Height:        Intake/Output Summary (Last 24 hours) at 08/15/2017 0853 Last data filed at 08/15/2017 0227 Gross per 24 hour  Intake 210.9 ml  Output -  Net 210.9 ml   Weight change:  Exam:   General:  Pt is alert, follows commands appropriately, not in acute distress  HEENT: No icterus, No thrush, No neck mass, [place]/AT  Cardiovascular: RRR, S1/S2, no rubs, no gallops  Respiratory: Scattered bilateral rales.  Diminished breath sounds bilateral.  No wheezing  Abdomen: Soft/+BS, non tender, non distended, no guarding  Extremities: No edema, No lymphangitis, No petechiae, No rashes, no synovitis   Data Reviewed: I have personally reviewed following labs and imaging studies Basic Metabolic Panel: Recent Labs  Lab 08/14/17 2310 08/15/17 0359  NA 128* 134*  K 3.4*  3.8  CL 91* 102  CO2 30 28  GLUCOSE 125* 97  BUN 8 6*  CREATININE 0.57 0.43*  CALCIUM 9.0 8.3*  MG 1.5*  --   PHOS 2.5  --    Liver Function Tests: Recent Labs  Lab 08/14/17 2310 08/15/17 0359  AST 15 10*  ALT 11 8  ALKPHOS  97 72  BILITOT 0.5 0.8  PROT 6.9 5.2*  ALBUMIN 3.2* 2.4*   No results for input(s): LIPASE, AMYLASE in the last 168 hours. Recent Labs  Lab 08/14/17 2310  AMMONIA 31   Coagulation Profile: No results for input(s): INR, PROTIME in the last 168 hours. CBC: Recent Labs  Lab 08/14/17 2310 08/15/17 0359  WBC 20.7* 17.8*  NEUTROABS 16.7* 14.1*  HGB 12.8 10.6*  HCT 38.7 32.4*  MCV 92.4 92.3  PLT 156 135*   Cardiac Enzymes: No results for input(s): CKTOTAL, CKMB, CKMBINDEX, TROPONINI in the last 168 hours. BNP: Invalid input(s): POCBNP CBG: No results for input(s): GLUCAP in the last 168 hours. HbA1C: No results for input(s): HGBA1C in the last 72 hours. Urine analysis:    Component Value Date/Time   COLORURINE YELLOW 08/15/2017 0108   APPEARANCEUR CLEAR 08/15/2017 0108   LABSPEC 1.012 08/15/2017 0108   PHURINE 6.0 08/15/2017 0108   GLUCOSEU NEGATIVE 08/15/2017 0108   HGBUR NEGATIVE 08/15/2017 0108   BILIRUBINUR NEGATIVE 08/15/2017 0108   KETONESUR NEGATIVE 08/15/2017 0108   PROTEINUR NEGATIVE 08/15/2017 0108   UROBILINOGEN 0.2 05/12/2013 0210   NITRITE NEGATIVE 08/15/2017 0108   LEUKOCYTESUR NEGATIVE 08/15/2017 0108   Sepsis Labs: @LABRCNTIP (procalcitonin:4,lacticidven:4) ) Recent Results (from the past 240 hour(s))  Blood Culture (routine x 2)     Status: None (Preliminary result)   Collection Time: 08/15/17 12:44 AM  Result Value Ref Range Status   Specimen Description BLOOD RIGHT ARM  Final   Special Requests   Final    BOTTLES DRAWN AEROBIC AND ANAEROBIC Blood Culture adequate volume   Culture   Final    NO GROWTH < 12 HOURS Performed at [facility], [address], [place], [place] [address]    Report Status PENDING  Incomplete  Blood Culture (routine x 2)     Status: None (Preliminary result)   Collection Time: 08/15/17 12:48 AM  Result Value Ref Range Status   Specimen Description BLOOD RIGHT ARM  Final   Special Requests   Final    BOTTLES DRAWN  AEROBIC AND ANAEROBIC Blood Culture adequate volume   Culture   Final    NO GROWTH < 12 HOURS Performed at [facility], [address] [place], [place] [address]    Report Status PENDING  Incomplete  MRSA PCR Screening     Status: None   Collection Time: 08/15/17  2:09 AM  Result Value Ref Range Status   MRSA by PCR NEGATIVE NEGATIVE Final    Comment:        The GeneXpert MRSA Assay (FDA approved for NASAL specimens only), is one component of a comprehensive MRSA colonization surveillance program. It is not intended to diagnose MRSA infection nor to guide or monitor treatment for MRSA infections. Performed at [facility], [address] [place], [place] [address]      Scheduled Meds: . atorvastatin  20 mg Oral q1800  . benztropine  1 mg Oral Daily  . calcium-vitamin D   Oral Daily  . demeclocycline  300 mg Oral Q12H  . enoxaparin (LOVENOX) injection  40 mg Subcutaneous Q24H  . gabapentin  100 mg Oral TID  . methimazole  10 mg Oral QODAY  . nicotine  21 mg Transdermal Daily  . pantoprazole  40 mg Oral Daily  . potassium chloride  10 mEq Oral Daily  . sodium chloride  1 g Oral TID WC  . vitamin B-12  1,000 mcg Oral Daily   Continuous Infusions: . 0.9 % NaCl with KCl 20 mEq / L 125 mL/hr at 08/15/17 0320  . piperacillin-tazobactam 3.375 g (08/15/17 0802)  . [START ON 08/16/2017] vancomycin      Procedures/Studies: Ct Head Wo Contrast  Result Date: 08/15/2017 CLINICAL DATA:  Somnolent. Status post polyp removed yesterday. History of headache, stroke. EXAM: CT HEAD WITHOUT CONTRAST TECHNIQUE: Contiguous axial images were obtained from the base of the skull through the vertex without intravenous contrast. COMPARISON:  MRI of the head Jun 10, 2017 FINDINGS: BRAIN: No intraparenchymal hemorrhage, mass effect nor midline shift. Moderate to severe parenchymal brain volume loss. Patchy supratentorial white matter hypodensities within normal range for patient's age, though  non-specific are most compatible with chronic small vessel ischemic disease. Old small RIGHT cerebellar infarct. No acute large vascular territory infarcts. No abnormal extra-axial fluid collections. Basal cisterns are patent. VASCULAR: Mild calcific atherosclerosis of the carotid siphons. SKULL: No skull fracture. No significant scalp soft tissue swelling. SINUSES/ORBITS: The mastoid air-cells and included paranasal sinuses are well-aerated.The included ocular globes and orbital contents are non-suspicious. OTHER: None. IMPRESSION: 1. No acute intracranial process. 2. Moderate to severe parenchymal brain volume loss. 3. Mild chronic small vessel ischemic changes. Old small RIGHT cerebellar infarct. Electronically Signed   By: Elon Alas M.D.   On: 08/15/2017 00:04   Dg Chest Port 1 View  Result Date: 08/14/2017 CLINICAL DATA:  62 y/o  F; altered mental status. EXAM: PORTABLE CHEST 1 VIEW COMPARISON:  04/05/2017 chest radiograph. FINDINGS: Stable heart size and mediastinal contours are within normal limits. Both lungs are clear. The visualized skeletal structures are unremarkable. IMPRESSION: No active disease. Electronically Signed   By: Kristine Garbe M.D.   On: 08/14/2017 22:58    Orson Eva, DO  [facility] Pager [phone]  If 7PM-7AM, please contact night-coverage [email or web address] Password [email or web address] 08/15/2017, 8:53 AM   LOS: 0 days

## 2017-08-15 NOTE — Plan of Care (Signed)
Resting quietly in bed,no visual distress noted,no complaints voiced at present.callbell within reach.

## 2017-08-15 NOTE — Plan of Care (Signed)
  Problem: Acute Rehab PT Goals(only PT should resolve) Goal: Patient Will Transfer Sit To/From Stand Outcome: Progressing Flowsheets (Taken 08/15/2017 1208) Patient will transfer sit to/from stand: with supervision Goal: Pt Will Transfer Bed To Chair/Chair To Bed Outcome: Progressing Flowsheets (Taken 08/15/2017 1208) Pt will Transfer Bed to Chair/Chair to Bed: with supervision Goal: Pt Will Ambulate Outcome: Progressing Flowsheets (Taken 08/15/2017 1208) Pt will Ambulate: with supervision;with cane  12:09 PM, 08/15/17 Lonell Grandchild, MPT Physical Therapist with [facility] [phone] office [phone] mobile phone

## 2017-08-15 NOTE — Care Management Note (Addendum)
Case Management Note  Patient Details  Name: Cassidy Smith MRN: [id number] Date of Birth: Mar 05, 1955  Subjective/Objective:  AMS.  Patient is from home, lives with her brother. Has RW.  Recommended for [facility] PT. Patient sleeping soundly. Discussed with recommendation with brother.  Patient has had [facility] previously.     Action/Plan: DC home with home health. Left message for Octavia Bruckner of [facility] for referral.  Octavia Bruckner will obtain orders via Epic when available. Anticipate DC Sunday.  Expected Discharge Date:  08/18/17               Expected Discharge Plan:  [place]  In-House Referral:     Discharge planning Services  CM Consult  Post Acute Care Choice:  Home Health Choice offered to:  Sibling  DME Arranged:    DME Agency:     HH Arranged:  PT HH Agency:  [facility] (formerly [employer])  Status of Service:  Completed, signed off  If discussed at [employer], dates discussed:    Additional Comments:  Ferron Ishmael, Chauncey Reading, RN 08/15/2017, 1:20 PM

## 2017-08-15 NOTE — Evaluation (Signed)
Physical Therapy Evaluation Patient Details Name: Cassidy Smith MRN: [id number] DOB: February 01, 1955 Today's Date: 08/15/2017   History of Present Illness  Cassidy Smith is a 62 y.o. female with medical history significant of anxiety, bipolar 1 disorder, chronic hip pain, chronic kidney disease, depression, headache, hypothyroidism, ischemic stroke, hands tremor, SIADH history who is brought to the emergency department due to somnolence after she underwent fall yesterday at [facility].  Since then, she has been sleepy and confused.  She was discharged earlier today, but continues to be very somnolent at home.  She wakes up briefly and may answer simple questions, but is unable to provide further history.    Clinical Impression  Patient limited for functional mobility as stated below secondary to BLE weakness, fatigue and poor standing balance.  Patient will benefit from continued physical therapy in hospital and recommended venue below to increase strength, balance, endurance for safe ADLs and gait.     Follow Up Recommendations Home health PT;Supervision for mobility/OOB    Equipment Recommendations       Recommendations for Other Services       Precautions / Restrictions Precautions Precautions: Fall Restrictions Weight Bearing Restrictions: No      Mobility  Bed Mobility Overal bed mobility: Modified Independent                Transfers Overall transfer level: Needs assistance Equipment used: 1 person hand held assist;None Transfers: Sit to/from [facility] Sit to Stand: Min guard Stand pivot transfers: Min guard       General transfer comment: slightly unsteady  Ambulation/Gait Ambulation/Gait assistance: Min assist Gait Distance (Feet): 25 Feet Assistive device: None;1 person hand held assist Gait Pattern/deviations: Decreased step length - right;Decreased step length - left;Decreased stride length Gait velocity: slow   General Gait  Details: demonstrates slow unsteady labored cadence, required hand held assist and tends to lean on nearby objects for support  Stairs            Wheelchair Mobility    Modified Rankin (Stroke Patients Only)       Balance Overall balance assessment: Needs assistance Sitting-balance support: Feet supported;No upper extremity supported Sitting balance-Leahy Scale: Good     Standing balance support: No upper extremity supported;During functional activity Standing balance-Leahy Scale: Poor Standing balance comment: fair/poor with hand held assist                             Pertinent Vitals/Pain Pain Assessment: No/denies pain    Home Living Family/patient expects to be discharged to:: Private residence Living Arrangements: Other relatives Available Help at Discharge: Personal care attendant Type of Home: House Home Access: Stairs to enter;Level entry Entrance Stairs-Rails: None Entrance Stairs-Number of Steps: no steps into house, 2 steps into dining room Home Layout: Two level Home Equipment: Cane - single point;Shower seat - built in Additional Comments: Pt reports she lives on 2nd level and brother is on first level. Neighbor on property assists with getting up and down stairs    Prior Function Level of Independence: Needs assistance   Gait / Transfers Assistance Needed: uses SPC for mobility  ADL's / Homemaking Assistance Needed: home aids x 4 hours/day x 7 days/week per patient's family member        Hand Dominance   Dominant Hand: Right    Extremity/Trunk Assessment   Upper Extremity Assessment Upper Extremity Assessment: Generalized weakness    Lower Extremity Assessment Lower  Extremity Assessment: Generalized weakness    Cervical / Trunk Assessment Cervical / Trunk Assessment: Normal  Communication   Communication: No difficulties  Cognition Arousal/Alertness: Awake/alert Behavior During Therapy: WFL for tasks  assessed/performed Overall Cognitive Status: Within Functional Limits for tasks assessed                                        General Comments      Exercises     Assessment/Plan    PT Assessment Patient needs continued PT services  PT Problem List Decreased strength;Decreased activity tolerance;Decreased balance;Decreased mobility       PT Treatment Interventions Gait training;Stair training;Functional mobility training;Therapeutic activities;Therapeutic exercise;Patient/family education    PT Goals (Current goals can be found in the Care Plan section)  Acute Rehab PT Goals Patient Stated Goal: return home with family to assist PT Goal Formulation: With patient/family Time For Goal Achievement: 08/22/17 Potential to Achieve Goals: Good    Frequency Min 3X/week   Barriers to discharge        Co-evaluation               AM-PAC PT "6 Clicks" Daily Activity  Outcome Measure Difficulty turning over in bed (including adjusting bedclothes, sheets and blankets)?: None Difficulty moving from lying on back to sitting on the side of the bed? : None Difficulty sitting down on and standing up from a chair with arms (e.g., wheelchair, bedside commode, etc,.)?: A Little Help needed moving to and from a bed to chair (including a wheelchair)?: A Little Help needed walking in hospital room?: A Little Help needed climbing 3-5 steps with a railing? : A Little 6 Click Score: 20    End of Session   Activity Tolerance: Patient tolerated treatment well;Patient limited by fatigue Patient left: in bed;with call bell/phone within reach;with family/visitor present Nurse Communication: Mobility status PT Visit Diagnosis: Unsteadiness on feet (R26.81);Other abnormalities of gait and mobility (R26.89);Muscle weakness (generalized) (M62.81)    Time: 1610-9604 PT Time Calculation (min) (ACUTE ONLY): 17 min   Charges:   PT Evaluation $PT Eval Low Complexity: 1 Low PT  Treatments $Therapeutic Activity: 8-22 mins        12:07 PM, 08/15/17 Lonell Grandchild, MPT Physical Therapist with [facility] [phone] office [phone] mobile phone

## 2017-08-15 NOTE — Progress Notes (Signed)
Pharmacy Antibiotic Note  Cassidy Smith is a 62 y.o. female admitted on 08/14/2017 with sepsis.  Pharmacy has been consulted for Vancomycin and Zosyn dosing.  Plan: Vancomycin 1000 mg IV every 24 hours.  Goal trough 15-20 mcg/mL. Zosyn 3.375g IV q8h (4 hour infusion).  Monitor labs, c/s, and vanco trough as indicated  Height: 5\' 8"  (172.7 cm) Weight: 114 lb 13.8 oz (52.1 kg) IBW/kg (Calculated) : 63.9  Temp (24hrs), Avg:100 F (37.8 C), Min:99.2 F (37.3 C), Max:100.8 F (38.2 C)  Recent Labs  Lab 08/14/17 2310 08/15/17 0054 08/15/17 0359  WBC 20.7*  --  17.8*  CREATININE 0.57  --  0.43*  LATICACIDVEN  --  1.19  --     Estimated Creatinine Clearance: 58.4 mL/min (A) (by C-G formula based on SCr of 0.43 mg/dL (L)).    No Known Allergies  Antimicrobials this admission: Zosyn 8/2 >>  Vanco 8/2 >>   Dose adjustments this admission: N/A  Microbiology results: 8/2 BCx: pending 8/2 MRSA PCR: pending  Thank you for allowing pharmacy to be a part of this patient's care.  Ramond Craver 08/15/2017 7:51 AM

## 2017-08-15 NOTE — H&P (Signed)
History and Physical    Cassidy Smith IRW:[id number] DOB: 1955-11-17 DOA: 08/14/2017  PCP: Neale Burly, MD   Patient coming from: Home.  I have personally briefly reviewed patient's old medical records in [place]  Chief Complaint: Somnolence.  HPI: Cassidy Smith is a 62 y.o. female with medical history significant of anxiety, bipolar 1 disorder, chronic hip pain, chronic kidney disease, depression, headache, hypothyroidism, ischemic stroke, hands tremor, SIADH history who is brought to the emergency department due to somnolence after she underwent fall yesterday at [facility].  Since then, she has been sleepy and confused.  She was discharged earlier today, but continues to be very somnolent at home.  She wakes up briefly and may answer simple questions, but is unable to provide further history.  ED Course: Initial vital signs temperature 100.8 F, pulse 79, respirations 15, blood pressure 124/69 mmHg and O2 sat 92% on room air.  The patient received at 1000 mL NS bolus, 1000 mg of vancomycin and 3.375 g of Zosyn IVPB.  Her urinalysis was normal.  Blood cultures x2 were drawn.  White count was 20.7 with 80% neutrophils, 12% lymphocytes and 8% monocytes.  Hemoglobin was 12.8 g/dL.  Platelets 156.  Ammonia level was 31 mol/L.  Lactic acid 1.19, sodium was 128, potassium 3.4, chloride 91 and CO2 30 mmol/L.  Phosphorus 2.5, magnesium 1.5, BUN 8, creatinine 0.57 and glucose 125 mg/dL.  Her hepatic function tests show an albumin decreased at 3.2 g/dL, but all other values are within normal limits.  Imaging: CT head without contrast did not show any acute intracranial abnormalities.  Please see images and full radiology report for further detail.  Review of Systems: Unable to obtain.  Past Medical History:  Diagnosis Date  . Anxiety   . Bipolar 1 disorder ([place])   . Chronic hip pain   . Chronic kidney disease    uti  currently,  hx bladder spasms  . Depression   .  Headache(784.0)   . Hyperthyroidism   . Ischemic stroke ([place])   . Neuromuscular disorder (HCC)    shaking of hands   . SIADH (syndrome of inappropriate ADH production) ([place]) 04/08/2017    Past Surgical History:  Procedure Laterality Date  . COLONOSCOPY WITH PROPOFOL N/A 04/05/2017   Procedure: COLONOSCOPY WITH PROPOFOL;  Surgeon: Rogene [place], MD;  Location: [facility];  Service: Endoscopy;  Laterality: N/A;  . INTRAMEDULLARY (IM) NAIL INTERTROCHANTERIC Right 03/07/2017   Procedure: OPEN TREATMENT INTERNAL FIXATION RIGHT HIP WITH GAMA INTRAMEDULARY NAIL;  Surgeon: Carole Civil, MD;  Location: [facility] ORS;  Service: Orthopedics;  Laterality: Right;  . MULTIPLE EXTRACTIONS WITH ALVEOLOPLASTY N/A 10/30/2012   Procedure: MULTIPLE EXTRACION 5, 6, 8, 9, 10 ,18, 19, 31 WITH MAXILLARY RIGHT AND LEFT  ALVEOLOPLASTY REDUCE MAXILLARY LEFT TUBEROSITY;  Surgeon: Gae Bon, DDS;  Location: [facility];  Service: Oral Surgery;  Laterality: N/A;  . POLYPECTOMY  04/05/2017   Procedure: POLYPECTOMY;  Surgeon: Rogene [place], MD;  Location: [facility];  Service: Endoscopy;;  recto-sigmoid, rectum     reports that she quit smoking about 3 months ago. Her smoking use included cigarettes. She has a 10.00 pack-year smoking history. She has never used smokeless tobacco. She reports that she does not drink alcohol or use drugs.  No Known Allergies  Family History  Problem Relation Age of Onset  . Breast cancer Mother   . Cancer - Other Mother   . Alcoholism  Father   . Colon cancer Neg Hx   . Colon polyps Neg Hx     Prior to Admission medications   Medication Sig Start Date End Date Taking? Authorizing Provider  atorvastatin (LIPITOR) 20 MG tablet Take 1 tablet (20 mg total) by mouth daily at 6 PM. 06/11/17  Yes Barton Dubois, MD  benztropine (COGENTIN) 1 MG tablet Take 1 tablet (1 mg total) by mouth daily. 06/11/17  Yes Barton Dubois, MD  Calcium Carb-Cholecalciferol [phone] MG-UNIT CAPS Take 1  capsule by mouth daily.   Yes [provider]  divalproex (DEPAKOTE ER) 500 MG 24 hr tablet Take 1,500 mg by mouth at bedtime.    Yes [provider]  enoxaparin (LOVENOX) 40 MG/0.4ML injection Inject 40 mg into the skin daily.   Yes [provider]  furosemide (LASIX) 20 MG tablet Take 1 tablet (20 mg total) by mouth daily. Patient taking differently: Take 10 mg by mouth daily.  04/18/17  Yes Barton Dubois, MD  gabapentin (NEURONTIN) 100 MG capsule Take 1 capsule (100 mg total) by mouth 3 (three) times daily. 05/13/17  Yes Carole Civil, MD  methimazole (TAPAZOLE) 10 MG tablet Take 10 mg by mouth every other day.  12/24/15  Yes [provider]  nicotine (NICODERM CQ - DOSED IN MG/24 HOURS) 21 mg/24hr patch Place 21 mg onto the skin daily.   Yes [provider]  pantoprazole (PROTONIX) 40 MG tablet Take 40 mg by mouth daily.   Yes [provider]  potassium chloride (K-DUR,KLOR-CON) 10 MEQ tablet Take 10 mEq by mouth daily.    Yes [provider]  sodium chloride 1 g tablet Take 2 tablets (2 g total) by mouth 3 (three) times daily with meals. Patient taking differently: Take 1 g by mouth 3 (three) times daily with meals.  04/17/17  Yes Barton Dubois, MD  traZODone (DESYREL) 100 MG tablet Take 100 mg by mouth at bedtime.    Yes [provider]  vitamin B-12 (CYANOCOBALAMIN) 1000 MCG tablet Take 1 tablet (1,000 mcg total) by mouth daily. 06/11/17  Yes Barton Dubois, MD  aspirin EC 325 MG tablet Take 1 tablet (325 mg total) by mouth daily. Patient not taking: Reported on 08/14/2017 06/11/17 06/11/18  Barton Dubois, MD  demeclocycline (DECLOMYCIN) 150 MG tablet Take 2 tablets (300 mg total) by mouth every 12 (twelve) hours. Patient not taking: Reported on 08/14/2017 06/11/17   Barton Dubois, MD    Physical Exam: Vitals:   08/15/17 0045 08/15/17 0100 08/15/17 0130 08/15/17 0215  BP:  (!) 95/59 (!) 112/55   Pulse: 79 81 79     Resp: (!) 25 (!) 21 (!) 24   Temp:    99.9 F (37.7 C)  TempSrc:    Oral  SpO2: 92% 91% 94%   Weight:    52.1 kg (114 lb 13.8 oz)  Height:    5\' 8"  (1.727 m)    Constitutional: Somnolent, mildly febrile, but in NAD, calm, comfortable Eyes: PERRL, lids and conjunctivae normal ENMT: Mucous membranes and lips are dry. Posterior pharynx clear of any exudate or lesions.  Mostly absent dentition. Neck: normal, supple, no masses, no thyromegaly Respiratory: Decreased breath sounds on bases, but otherwise clear to auscultation bilaterally, no wheezing, no crackles. Normal respiratory effort. No accessory muscle use.  Cardiovascular: Regular rate and rhythm, no murmurs / rubs / gallops. No extremity edema. 2+ pedal pulses. No carotid bruits.  Abdomen: Soft, no tenderness, no masses palpated. No hepatosplenomegaly.  Bowel sounds positive.  Musculoskeletal: no clubbing / cyanosis.  Good ROM, no contractures.  Relaxed muscle tone.  Skin: no rashes, lesions, ulcers on limited dermatological examination. Neurologic: Somnolent.  Wakes up briefly.  Looks grossly nonfocal.  Unable to fully evaluate. Psychiatric: Somnolent.  Wakes up briefly and occasionally follows commands.   Labs on Admission: I have personally reviewed following labs and imaging studies  CBC: Recent Labs  Lab 08/14/17 2310  WBC 20.7*  NEUTROABS 16.7*  HGB 12.8  HCT 38.7  MCV 92.4  PLT 315   Basic Metabolic Panel: Recent Labs  Lab 08/14/17 2310  NA 128*  K 3.4*  CL 91*  CO2 30  GLUCOSE 125*  BUN 8  CREATININE 0.57  CALCIUM 9.0  MG 1.5*  PHOS 2.5   GFR: Estimated Creatinine Clearance: 58.4 mL/min (by C-G formula based on SCr of 0.57 mg/dL). Liver Function Tests: Recent Labs  Lab 08/14/17 2310  AST 15  ALT 11  ALKPHOS 97  BILITOT 0.5  PROT 6.9  ALBUMIN 3.2*   No results for input(s): LIPASE, AMYLASE in the last 168 hours. Recent Labs  Lab 08/14/17 2310  AMMONIA 31   Coagulation Profile: No  results for input(s): INR, PROTIME in the last 168 hours. Cardiac Enzymes: No results for input(s): CKTOTAL, CKMB, CKMBINDEX, TROPONINI in the last 168 hours. BNP (last 3 results) No results for input(s): PROBNP in the last 8760 hours. HbA1C: No results for input(s): HGBA1C in the last 72 hours. CBG: No results for input(s): GLUCAP in the last 168 hours. Lipid Profile: No results for input(s): CHOL, HDL, LDLCALC, TRIG, CHOLHDL, LDLDIRECT in the last 72 hours. Thyroid Function Tests: No results for input(s): TSH, T4TOTAL, FREET4, T3FREE, THYROIDAB in the last 72 hours. Anemia Panel: No results for input(s): VITAMINB12, FOLATE, FERRITIN, TIBC, IRON, RETICCTPCT in the last 72 hours. Urine analysis:    Component Value Date/Time   COLORURINE YELLOW 08/15/2017 0108   APPEARANCEUR CLEAR 08/15/2017 0108   LABSPEC 1.012 08/15/2017 0108   PHURINE 6.0 08/15/2017 0108   GLUCOSEU NEGATIVE 08/15/2017 0108   HGBUR NEGATIVE 08/15/2017 0108   BILIRUBINUR NEGATIVE 08/15/2017 0108   KETONESUR NEGATIVE 08/15/2017 0108   PROTEINUR NEGATIVE 08/15/2017 0108   UROBILINOGEN 0.2 05/12/2013 0210   NITRITE NEGATIVE 08/15/2017 0108   LEUKOCYTESUR NEGATIVE 08/15/2017 0108    Radiological Exams on Admission: Ct Head Wo Contrast  Result Date: 08/15/2017 CLINICAL DATA:  Somnolent. Status post polyp removed yesterday. History of headache, stroke. EXAM: CT HEAD WITHOUT CONTRAST TECHNIQUE: Contiguous axial images were obtained from the base of the skull through the vertex without intravenous contrast. COMPARISON:  MRI of the head Jun 10, 2017 FINDINGS: BRAIN: No intraparenchymal hemorrhage, mass effect nor midline shift. Moderate to severe parenchymal brain volume loss. Patchy supratentorial white matter hypodensities within normal range for patient's age, though non-specific are most compatible with chronic small vessel ischemic disease. Old small RIGHT cerebellar infarct. No acute large vascular territory infarcts.  No abnormal extra-axial fluid collections. Basal cisterns are patent. VASCULAR: Mild calcific atherosclerosis of the carotid siphons. SKULL: No skull fracture. No significant scalp soft tissue swelling. SINUSES/ORBITS: The mastoid air-cells and included paranasal sinuses are well-aerated.The included ocular globes and orbital contents are non-suspicious. OTHER: None. IMPRESSION: 1. No acute intracranial process. 2. Moderate to severe parenchymal brain volume loss. 3. Mild chronic small vessel ischemic changes. Old small RIGHT cerebellar infarct. Electronically Signed   By: Elon Alas M.D.   On: 08/15/2017 00:04   Dg Chest  Port 1 View  Result Date: 08/14/2017 CLINICAL DATA:  62 y/o  F; altered mental status. EXAM: PORTABLE CHEST 1 VIEW COMPARISON:  04/05/2017 chest radiograph. FINDINGS: Stable heart size and mediastinal contours are within normal limits. Both lungs are clear. The visualized skeletal structures are unremarkable. IMPRESSION: No active disease. Electronically Signed   By: Kristine Garbe M.D.   On: 08/14/2017 22:58    EKG: Independently reviewed.  Vent. rate 79 BPM PR interval * ms QRS duration 100 ms QT/QTc 391/449 ms P-R-T axes 80 21 85  Assessment/Plan Principal Problem:   Altered mental status Unknown etiology at this time. Suspect hypersensitive reaction to anesthesia or sedative. Less likely this could be medication induced.   However her brother controls her medication administration very closely. Continue IV fluids. Neuro checks every 4 hours. Consider MRI imaging if no improvement. Consider neurology evaluation.  Active Problems:   Fever Normal urinalysis. Normal chest radiograph. However, she has leukocytosis of 20k. Continue vancomycin and Zosyn for now. Follow-up blood culture and sensitivity.    Hyponatremia Replacing. Continue sodium supplementation. Follow-up sodium level as needed.    Hypokalemia Replacing. Follow-up potassium  level.    Hx of anxiety disorder   Bipolar 1 disorder (HCC) Valproic acid supratherapeutic. Hold for now. Hold Cogentin, Neurontin and trazodone for now.    Hypomagnesemia Replacing.   Follow-up level as needed.    Hyperthyroidism Resume methimazole once more alert. Check TSH.    GERD (gastroesophageal reflux disease) Resume Protonix once more alert to swallow medications.     DVT prophylaxis: Lovenox SQ. Code Status: Full code. Family Communication: Her cousin CL was present in the ED. Disposition Plan: 24-48 hr observation in SDU and IV antibiotics for fever of unknown source. Consults called:  Admission status: Observation/stepdown.   Reubin Milan MD [facility] Pager [phone].  If 7PM-7AM, please contact night-coverage [email or web address] Password [email or web address]  08/15/2017, 2:23 AM

## 2017-08-16 DIAGNOSIS — E871 Hypo-osmolality and hyponatremia: Secondary | ICD-10-CM

## 2017-08-16 DIAGNOSIS — R5082 Postprocedural fever: Secondary | ICD-10-CM

## 2017-08-16 DIAGNOSIS — T426X1D Poisoning by other antiepileptic and sedative-hypnotic drugs, accidental (unintentional), subsequent encounter: Secondary | ICD-10-CM

## 2017-08-16 LAB — CBC WITH DIFFERENTIAL/PLATELET
BASOS ABS: 0 10*3/uL (ref 0.0–0.1)
BASOS PCT: 0 %
Eosinophils Absolute: 0.1 10*3/uL (ref 0.0–0.7)
Eosinophils Relative: 1 %
HEMATOCRIT: 34 % — AB (ref 36.0–46.0)
HEMOGLOBIN: 11.1 g/dL — AB (ref 12.0–15.0)
LYMPHS PCT: 11 %
Lymphs Abs: 1.8 10*3/uL (ref 0.7–4.0)
MCH: 30.2 pg (ref 26.0–34.0)
MCHC: 32.6 g/dL (ref 30.0–36.0)
MCV: 92.6 fL (ref 78.0–100.0)
MONO ABS: 1.3 10*3/uL — AB (ref 0.1–1.0)
MONOS PCT: 8 %
NEUTROS ABS: 12.8 10*3/uL — AB (ref 1.7–7.7)
NEUTROS PCT: 80 %
Platelets: 134 10*3/uL — ABNORMAL LOW (ref 150–400)
RBC: 3.67 MIL/uL — ABNORMAL LOW (ref 3.87–5.11)
RDW: 14.9 % (ref 11.5–15.5)
WBC: 16 10*3/uL — ABNORMAL HIGH (ref 4.0–10.5)

## 2017-08-16 LAB — BASIC METABOLIC PANEL
ANION GAP: 5 (ref 5–15)
BUN: 7 mg/dL — ABNORMAL LOW (ref 8–23)
CALCIUM: 9.2 mg/dL (ref 8.9–10.3)
CO2: 28 mmol/L (ref 22–32)
Chloride: 99 mmol/L (ref 98–111)
Creatinine, Ser: 0.47 mg/dL (ref 0.44–1.00)
GLUCOSE: 92 mg/dL (ref 70–99)
POTASSIUM: 3.9 mmol/L (ref 3.5–5.1)
Sodium: 132 mmol/L — ABNORMAL LOW (ref 135–145)

## 2017-08-16 LAB — GLUCOSE, CAPILLARY
GLUCOSE-CAPILLARY: 107 mg/dL — AB (ref 70–99)
GLUCOSE-CAPILLARY: 81 mg/dL (ref 70–99)
GLUCOSE-CAPILLARY: 85 mg/dL (ref 70–99)
Glucose-Capillary: 106 mg/dL — ABNORMAL HIGH (ref 70–99)

## 2017-08-16 LAB — MAGNESIUM: MAGNESIUM: 1.5 mg/dL — AB (ref 1.7–2.4)

## 2017-08-16 LAB — VALPROIC ACID LEVEL: VALPROIC ACID LVL: 67 ug/mL (ref 50.0–100.0)

## 2017-08-16 MED ORDER — ACETAMINOPHEN 500 MG PO TABS
1000.0000 mg | ORAL_TABLET | Freq: Four times a day (QID) | ORAL | Status: DC | PRN
  Administered 2017-08-16: 1000 mg via ORAL
  Filled 2017-08-16: qty 2

## 2017-08-16 MED ORDER — DIVALPROEX SODIUM 500 MG PO DR TAB: 1000.0000 mg | DELAYED_RELEASE_TABLET | Freq: Every day | ORAL | 1 refills | Status: DC

## 2017-08-16 MED ORDER — DIVALPROEX SODIUM ER 500 MG PO TB24
1000.0000 mg | ORAL_TABLET | Freq: Every day | ORAL | Status: DC
  Administered 2017-08-16: 1000 mg via ORAL
  Filled 2017-08-16: qty 2

## 2017-08-16 MED ORDER — MAGNESIUM SULFATE 2 GM/50ML IV SOLN
2.0000 g | Freq: Once | INTRAVENOUS | Status: AC
  Administered 2017-08-16: 2 g via INTRAVENOUS
  Filled 2017-08-16: qty 50

## 2017-08-16 MED ORDER — DIVALPROEX SODIUM ER 500 MG PO TB24: 1000.0000 mg | ORAL_TABLET | Freq: Every day | ORAL | 1 refills | Status: AC

## 2017-08-16 MED ORDER — DIVALPROEX SODIUM 250 MG PO DR TAB: 1000.0000 mg | DELAYED_RELEASE_TABLET | Freq: Every day | ORAL | Status: DC

## 2017-08-16 NOTE — Progress Notes (Signed)
PROGRESS NOTE  Cassidy Smith IFO:[id number] DOB: June 24, 1955 DOA: 08/14/2017 PCP: Neale Burly, MD  Brief History:  62 year old female with a history of bipolar disorder, chronic pain, stroke, SIADH, postoperative DVT after her hip surgery February 2019, rectal cancer presenting with increasing somnolence and altered mental status.  Patient was recently discharged from [facility] on 08/14/2017 after she was monitored overnight post transanal endoscopic microsurgery for her high-grade rectal polyp.  According to the patient's cousin, the patient was very somnolent and difficult to arouse.  Nevertheless, the patient was aroused and was given her medications in crushed form on 08/14/2017.  There is no new changes in her medications.  There is no reports of respiratory distress, vomiting, diarrhea, headaches.  Upon presentation, the patient had a temperature 100.8 F but was hemodynamically stable.  WBC was 20.7 at the time of admission.  Because of altered mental status, the patient was admitted for further evaluation and treatment.  CT of the brain initially was negative.  Assessment/Plan: Acute metabolic encephalopathy -Multifactorial including residual effects of anesthesia, supratherapeutic valproic acid, hyponatremia, opioids per surgery, and possible infectious process -Ammonia 31 -VPA level 124>>>67 -Personally reviewed EKG--sinus rhythm, nonspecific T wave change -08/15/2017 TSH 0.589 -Patient now at her baseline  -At baseline, the patient is able to perform all her ADLs, and is alert and oriented x2 -MRI brain--neg for acute findings  Depakote toxicity -Holding Depakote -Repeat Depakote level 67 -restart depakote at lower dose   Fever/leukocytosis -Urinalysis negative for pyuria -Personally reviewed chest x-ray--no infiltrates or edema -Continue Zosyn pending culture data -d/c vancomycin  Hyponatremia -Partly due to volume depletion and poor solute  intake  Hypomagnesemia -Replete  Hyperthyroidism -Continue Tapazole -TSH 0.589  Bipolar disorder -Continue Cogentin -Holding valproic acid as discussed-->restart 8/3      Disposition Plan:   Home 8/4 if stable Family Communication:  Cousin updated at bedside   Consultants:  none  Code Status:  FULL   DVT Prophylaxis:   [place] Lovenox   Procedures: As Listed in Progress Note Above  Antibiotics: vanco 8/1>>>8/3 Zosyn 8/1>>>      Subjective: Patient denies fevers, chills, headache, chest pain, dyspnea, nausea, vomiting, diarrhea, abdominal pain, dysuria, hematuria, hematochezia, and melena.   Objective: Vitals:   08/15/17 1000 08/15/17 1423 08/15/17 2317 08/16/17 1321  BP: 134/64 139/66 127/68 (!) 152/98  Pulse: 80 70 77 72  Resp: (!) 21 20 16 18   Temp:  99.4 F (37.4 C) 99.1 F (37.3 C) 98.2 F (36.8 C)  TempSrc:  Oral Oral Oral  SpO2: 96% 99% 94% 100%  Weight:      Height:        Intake/Output Summary (Last 24 hours) at 08/16/2017 1339 Last data filed at 08/16/2017 0600 Gross per 24 hour  Intake 2002.08 ml  Output 500 ml  Net 1502.08 ml   Weight change:  Exam:   General:  Pt is alert, follows commands appropriately, not in acute distress  HEENT: No icterus, No thrush, No neck mass, [place]/AT  Cardiovascular: RRR, S1/S2, no rubs, no gallops  Respiratory: CTA bilaterally, no wheezing, no crackles, no rhonchi  Abdomen: Soft/+BS, non tender, non distended, no guarding  Extremities: No edema, No lymphangitis, No petechiae, No rashes, no synovitis   Data Reviewed: I have personally reviewed following labs and imaging studies Basic Metabolic Panel: Recent Labs  Lab 08/14/17 2310 08/15/17 0359 08/16/17 0628  NA 128* 134* 132*  K 3.4* 3.8 3.9  CL 91* 102 99  CO2 30 28 28   GLUCOSE 125* 97 92  BUN 8 6* 7*  CREATININE 0.57 0.43* 0.47  CALCIUM 9.0 8.3* 9.2  MG 1.5*  --  1.5*  PHOS 2.5  --   --    Liver Function Tests: Recent  Labs  Lab 08/14/17 2310 08/15/17 0359  AST 15 10*  ALT 11 8  ALKPHOS 97 72  BILITOT 0.5 0.8  PROT 6.9 5.2*  ALBUMIN 3.2* 2.4*   No results for input(s): LIPASE, AMYLASE in the last 168 hours. Recent Labs  Lab 08/14/17 2310  AMMONIA 31   Coagulation Profile: No results for input(s): INR, PROTIME in the last 168 hours. CBC: Recent Labs  Lab 08/14/17 2310 08/15/17 0359 08/16/17 0628  WBC 20.7* 17.8* 16.0*  NEUTROABS 16.7* 14.1* 12.8*  HGB 12.8 10.6* 11.1*  HCT 38.7 32.4* 34.0*  MCV 92.4 92.3 92.6  PLT 156 135* 134*   Cardiac Enzymes: No results for input(s): CKTOTAL, CKMB, CKMBINDEX, TROPONINI in the last 168 hours. BNP: Invalid input(s): POCBNP CBG: Recent Labs  Lab 08/15/17 1135 08/15/17 1728 08/16/17 0224 08/16/17 0527 08/16/17 1210  GLUCAP 100* 121* 107* 81 85   HbA1C: No results for input(s): HGBA1C in the last 72 hours. Urine analysis:    Component Value Date/Time   COLORURINE YELLOW 08/15/2017 0108   APPEARANCEUR CLEAR 08/15/2017 0108   LABSPEC 1.012 08/15/2017 0108   PHURINE 6.0 08/15/2017 0108   GLUCOSEU NEGATIVE 08/15/2017 0108   HGBUR NEGATIVE 08/15/2017 0108   BILIRUBINUR NEGATIVE 08/15/2017 0108   KETONESUR NEGATIVE 08/15/2017 0108   PROTEINUR NEGATIVE 08/15/2017 0108   UROBILINOGEN 0.2 05/12/2013 0210   NITRITE NEGATIVE 08/15/2017 0108   LEUKOCYTESUR NEGATIVE 08/15/2017 0108   Sepsis Labs: @LABRCNTIP (procalcitonin:4,lacticidven:4) ) Recent Results (from the past 240 hour(s))  Blood Culture (routine x 2)     Status: None (Preliminary result)   Collection Time: 08/15/17 12:44 AM  Result Value Ref Range Status   Specimen Description BLOOD RIGHT ARM  Final   Special Requests   Final    BOTTLES DRAWN AEROBIC AND ANAEROBIC Blood Culture adequate volume   Culture   Final    NO GROWTH 1 DAY Performed at [facility], [address], [place], [place] [address]    Report Status PENDING  Incomplete  Blood Culture (routine x 2)     Status:  None (Preliminary result)   Collection Time: 08/15/17 12:48 AM  Result Value Ref Range Status   Specimen Description BLOOD RIGHT ARM  Final   Special Requests   Final    BOTTLES DRAWN AEROBIC AND ANAEROBIC Blood Culture adequate volume   Culture   Final    NO GROWTH 1 DAY Performed at [facility], [address], [place], [place] [address]    Report Status PENDING  Incomplete  MRSA PCR Screening     Status: None   Collection Time: 08/15/17  2:09 AM  Result Value Ref Range Status   MRSA by PCR NEGATIVE NEGATIVE Final    Comment:        The GeneXpert MRSA Assay (FDA approved for NASAL specimens only), is one component of a comprehensive MRSA colonization surveillance program. It is not intended to diagnose MRSA infection nor to guide or monitor treatment for MRSA infections. Performed at [facility], [address] [place], [place] [address]      Scheduled Meds: . atorvastatin  20 mg Oral q1800  . benztropine  1 mg Oral Daily  .  calcium-vitamin D   Oral Daily  . divalproex  1,000 mg Oral QHS  . enoxaparin (LOVENOX) injection  40 mg Subcutaneous Q24H  . gabapentin  100 mg Oral TID  . methimazole  10 mg Oral QODAY  . nicotine  21 mg Transdermal Daily  . pantoprazole  40 mg Oral Daily  . potassium chloride  10 mEq Oral Daily  . sodium chloride  1 g Oral TID WC  . vitamin B-12  1,000 mcg Oral Daily   Continuous Infusions: . magnesium sulfate 1 - 4 g bolus IVPB    . piperacillin-tazobactam 3.375 g (08/16/17 0959)    Procedures/Studies: Ct Head Wo Contrast  Result Date: 08/15/2017 CLINICAL DATA:  Somnolent. Status post polyp removed yesterday. History of headache, stroke. EXAM: CT HEAD WITHOUT CONTRAST TECHNIQUE: Contiguous axial images were obtained from the base of the skull through the vertex without intravenous contrast. COMPARISON:  MRI of the head Jun 10, 2017 FINDINGS: BRAIN: No intraparenchymal hemorrhage, mass effect nor midline shift. Moderate to severe  parenchymal brain volume loss. Patchy supratentorial white matter hypodensities within normal range for patient's age, though non-specific are most compatible with chronic small vessel ischemic disease. Old small RIGHT cerebellar infarct. No acute large vascular territory infarcts. No abnormal extra-axial fluid collections. Basal cisterns are patent. VASCULAR: Mild calcific atherosclerosis of the carotid siphons. SKULL: No skull fracture. No significant scalp soft tissue swelling. SINUSES/ORBITS: The mastoid air-cells and included paranasal sinuses are well-aerated.The included ocular globes and orbital contents are non-suspicious. OTHER: None. IMPRESSION: 1. No acute intracranial process. 2. Moderate to severe parenchymal brain volume loss. 3. Mild chronic small vessel ischemic changes. Old small RIGHT cerebellar infarct. Electronically Signed   By: Elon Alas M.D.   On: 08/15/2017 00:04   Mr Brain Wo Contrast  Result Date: 08/15/2017 CLINICAL DATA:  Altered mental status.  Dysphasia. EXAM: MRI HEAD WITHOUT CONTRAST TECHNIQUE: Multiplanar, multiecho pulse sequences of the brain and surrounding structures were obtained without intravenous contrast. COMPARISON:  Head CT 08/14/2017 and MRI 06/10/2017 FINDINGS: Brain: A 5 mm focus of persistent trace diffusion signal abnormality is noted in the left parietal periventricular white matter without reduced ADC (the same location of the infarct described on the prior MRI). There is no evidence of acute infarct, intracranial hemorrhage, mass, midline shift, or extra-axial fluid collection. Moderately advanced central predominant cerebral atrophy is noted. Patchy deep cerebral white matter T2 hyperintensities are similar to the prior MRI allowing for differences in technique and are again noted to be asymmetrically greater on the right, nonspecific but compatible with moderate chronic small vessel ischemic disease. Small chronic infarcts are again seen in the right  cerebellum and posterior left temporal stem. Vascular: Major intracranial vascular flow voids are preserved. Skull and upper cervical spine: No suspicious marrow lesion. Sinuses/Orbits: Unremarkable orbits. Paranasal sinuses and mastoid air cells are clear. Other: None. IMPRESSION: 1. No acute intracranial abnormality. 2. Moderate chronic small vessel ischemic disease and cerebral atrophy. Electronically Signed   By: Logan Bores M.D.   On: 08/15/2017 11:38   Dg Chest Port 1 View  Result Date: 08/14/2017 CLINICAL DATA:  62 y/o  F; altered mental status. EXAM: PORTABLE CHEST 1 VIEW COMPARISON:  04/05/2017 chest radiograph. FINDINGS: Stable heart size and mediastinal contours are within normal limits. Both lungs are clear. The visualized skeletal structures are unremarkable. IMPRESSION: No active disease. Electronically Signed   By: Kristine Garbe M.D.   On: 08/14/2017 22:58    Orson Eva, DO  [facility] Pager [phone]  If 7PM-7AM, please contact night-coverage [email or web address] Password [email or web address] 08/16/2017, 1:39 PM   LOS: 1 day

## 2017-08-17 LAB — BASIC METABOLIC PANEL
Anion gap: 8 (ref 5–15)
BUN: 7 mg/dL — AB (ref 8–23)
CALCIUM: 9 mg/dL (ref 8.9–10.3)
CO2: 29 mmol/L (ref 22–32)
Chloride: 93 mmol/L — ABNORMAL LOW (ref 98–111)
Creatinine, Ser: 0.37 mg/dL — ABNORMAL LOW (ref 0.44–1.00)
GFR calc Af Amer: >60 mL/min (ref >60)
GLUCOSE: 96 mg/dL (ref 70–99)
Potassium: 3.2 mmol/L — ABNORMAL LOW (ref 3.5–5.1)
Sodium: 130 mmol/L — ABNORMAL LOW (ref 135–145)

## 2017-08-17 LAB — CBC WITH DIFFERENTIAL/PLATELET
BASOS ABS: 0 10*3/uL (ref 0.0–0.1)
Basophils Relative: 0 %
EOS PCT: 1 %
Eosinophils Absolute: 0.1 10*3/uL (ref 0.0–0.7)
HCT: 34.1 % — ABNORMAL LOW (ref 36.0–46.0)
Hemoglobin: 11.2 g/dL — ABNORMAL LOW (ref 12.0–15.0)
LYMPHS PCT: 22 %
Lymphs Abs: 2 10*3/uL (ref 0.7–4.0)
MCH: 30 pg (ref 26.0–34.0)
MCHC: 32.8 g/dL (ref 30.0–36.0)
MCV: 91.4 fL (ref 78.0–100.0)
MONO ABS: 0.7 10*3/uL (ref 0.1–1.0)
Monocytes Relative: 7 %
Neutro Abs: 6.4 10*3/uL (ref 1.7–7.7)
Neutrophils Relative %: 70 %
PLATELETS: 152 10*3/uL (ref 150–400)
RBC: 3.73 MIL/uL — ABNORMAL LOW (ref 3.87–5.11)
RDW: 14.5 % (ref 11.5–15.5)
WBC: 9.1 10*3/uL (ref 4.0–10.5)

## 2017-08-17 LAB — MAGNESIUM: Magnesium: 1.4 mg/dL — ABNORMAL LOW (ref 1.7–2.4)

## 2017-08-17 MED ORDER — MAGNESIUM OXIDE 400 (241.3 MG) MG PO TABS: 400.0000 mg | ORAL_TABLET | Freq: Every day | ORAL | 0 refills | Status: DC

## 2017-08-17 MED ORDER — MAGNESIUM OXIDE 400 (241.3 MG) MG PO TABS
400.0000 mg | ORAL_TABLET | Freq: Every day | ORAL | Status: DC
  Administered 2017-08-17: 400 mg via ORAL
  Filled 2017-08-17: qty 1

## 2017-08-17 NOTE — Progress Notes (Signed)
Discharge instructions read to patients cousin.  She states she will be giving lovenox injections to patient. Subq instructions for lovenox review with her. Discharge to home with cousin

## 2017-08-17 NOTE — Discharge Summary (Signed)
Physician Discharge Summary  Cassidy Smith:[id number] DOB: 27-May-1955 DOA: 08/14/2017  PCP: Neale Burly, MD  Admit date: 08/14/2017 Discharge date: 08/17/2017  Admitted From: Home Disposition:  Home   Recommendations for Outpatient Follow-up:  1. Follow up with PCP in 1-2 weeks 2. Please obtain BMP/CBC in one week    Discharge Condition: Stable CODE STATUS: FULL Diet recommendation: Heart Healthy   Brief/Interim Summary: 62 year old female with a history of bipolar disorder, chronic pain, stroke, SIADH, postoperative DVT after her hip surgery February 2019, rectal cancer presenting with increasing somnolence and altered mental status. Patient was recently discharged from [facility] 08/14/2017 after she was monitored overnight post transanal endoscopic microsurgery for her high-grade rectal polyp. According to the patient's cousin, the patient was very somnolent and difficult to arouse. Nevertheless, the patient was aroused and was given her medications in crushed form on 08/14/2017. There is no new changes in her medications. There is no reports of respiratory distress, vomiting, diarrhea, headaches. Upon presentation, the patient had a temperature 100.8 F but was hemodynamically stable. WBC was 20.7 at the time of admission. Because of altered mental status, the patient was admitted for further evaluation and treatment. CT of the brain initially was negative. At the time of discharge, the patient's mental status returned to baseline.  The patient's brother did express some concern regarding the patient's bipolar disorder and occasionally "acting up".  I recommended that the brother contact the patient's neurologist regarding possibly restarting the patient's Risperdal which was discontinued previously by the neurologist.    Discharge Diagnoses:  Acute metabolic encephalopathy -Multifactorial including residual effects of anesthesia, supratherapeutic valproic acid,  hyponatremia, opioids from surgery -Ammonia 31 -VPA level124>>>67 -Personally reviewed EKG--sinus rhythm, nonspecific T wave change -08/15/2017 TSH 0.589 -Patient now at her baseline  -At baseline, the patient is able to perform all her ADLs, and is alert and oriented x2 -MRI brain--neg for acute findings  Depakote toxicity -Holding Depakote -Repeat Depakote level 67 -restart depakote at lower dose--1000 mg daily -recheck depakote level in one week  Fever/leukocytosis -Urinalysis negative for pyuria -Personally reviewed chest x-ray--no infiltrates or edema -Continue Zosyn through the hospitalization-->d/c -d/c vancomycin -100.8 initially in ED-->subsequently afebrile and hemodynamically stable for rest of hospitalization  Hyponatremia -Partly due to volume depletion and poor solute intake -chronic ranging 125-130  Hypomagnesemia -Replete -d/c home with magox 400 mg daily  Hyperthyroidism -Continue Tapazole -TSH 0.589  Bipolar disorder -Continue Cogentin -Holding valproic acid as discussed-->restart 8/3     Discharge Instructions   Allergies as of 08/17/2017   No Known Allergies     Medication List    STOP taking these medications   aspirin EC 325 MG tablet   demeclocycline 150 MG tablet Commonly known as:  DECLOMYCIN     TAKE these medications   atorvastatin 20 MG tablet Commonly known as:  LIPITOR Take 1 tablet (20 mg total) by mouth daily at 6 PM.   benztropine 1 MG tablet Commonly known as:  COGENTIN Take 1 tablet (1 mg total) by mouth daily.   Calcium Carb-Cholecalciferol [id number] MG-UNIT Caps Take 1 capsule by mouth daily.   divalproex 500 MG 24 hr tablet Commonly known as:  DEPAKOTE ER Take 2 tablets (1,000 mg total) by mouth at bedtime. What changed:  how much to take   enoxaparin 40 MG/0.4ML injection Commonly known as:  LOVENOX Inject 40 mg into the skin daily.   furosemide 20 MG tablet Commonly known as:  LASIX Take 1 tablet  (20  mg total) by mouth daily. What changed:  how much to take   gabapentin 100 MG capsule Commonly known as:  NEURONTIN Take 1 capsule (100 mg total) by mouth 3 (three) times daily.   methimazole 10 MG tablet Commonly known as:  TAPAZOLE Take 10 mg by mouth every other day.   nicotine 21 mg/24hr patch Commonly known as:  NICODERM CQ - dosed in mg/24 hours Place 21 mg onto the skin daily.   pantoprazole 40 MG tablet Commonly known as:  PROTONIX Take 40 mg by mouth daily.   potassium chloride 10 MEQ tablet Commonly known as:  K-DUR,KLOR-CON Take 10 mEq by mouth daily.   sodium chloride 1 g tablet Take 2 tablets (2 g total) by mouth 3 (three) times daily with meals. What changed:  how much to take   traZODone 100 MG tablet Commonly known as:  DESYREL Take 100 mg by mouth at bedtime.   vitamin B-12 1000 MCG tablet Commonly known as:  CYANOCOBALAMIN Take 1 tablet (1,000 mcg total) by mouth daily.       No Known Allergies  Consultations:  none   Procedures/Studies: Ct Head Wo Contrast  Result Date: 08/15/2017 CLINICAL DATA:  Somnolent. Status post polyp removed yesterday. History of headache, stroke. EXAM: CT HEAD WITHOUT CONTRAST TECHNIQUE: Contiguous axial images were obtained from the base of the skull through the vertex without intravenous contrast. COMPARISON:  MRI of the head Jun 10, 2017 FINDINGS: BRAIN: No intraparenchymal hemorrhage, mass effect nor midline shift. Moderate to severe parenchymal brain volume loss. Patchy supratentorial white matter hypodensities within normal range for patient's age, though non-specific are most compatible with chronic small vessel ischemic disease. Old small RIGHT cerebellar infarct. No acute large vascular territory infarcts. No abnormal extra-axial fluid collections. Basal cisterns are patent. VASCULAR: Mild calcific atherosclerosis of the carotid siphons. SKULL: No skull fracture. No significant scalp soft tissue swelling.  SINUSES/ORBITS: The mastoid air-cells and included paranasal sinuses are well-aerated.The included ocular globes and orbital contents are non-suspicious. OTHER: None. IMPRESSION: 1. No acute intracranial process. 2. Moderate to severe parenchymal brain volume loss. 3. Mild chronic small vessel ischemic changes. Old small RIGHT cerebellar infarct. Electronically Signed   By: Elon Alas M.D.   On: 08/15/2017 00:04   Mr Brain Wo Contrast  Result Date: 08/15/2017 CLINICAL DATA:  Altered mental status.  Dysphasia. EXAM: MRI HEAD WITHOUT CONTRAST TECHNIQUE: Multiplanar, multiecho pulse sequences of the brain and surrounding structures were obtained without intravenous contrast. COMPARISON:  Head CT 08/14/2017 and MRI 06/10/2017 FINDINGS: Brain: A 5 mm focus of persistent trace diffusion signal abnormality is noted in the left parietal periventricular white matter without reduced ADC (the same location of the infarct described on the prior MRI). There is no evidence of acute infarct, intracranial hemorrhage, mass, midline shift, or extra-axial fluid collection. Moderately advanced central predominant cerebral atrophy is noted. Patchy deep cerebral white matter T2 hyperintensities are similar to the prior MRI allowing for differences in technique and are again noted to be asymmetrically greater on the right, nonspecific but compatible with moderate chronic small vessel ischemic disease. Small chronic infarcts are again seen in the right cerebellum and posterior left temporal stem. Vascular: Major intracranial vascular flow voids are preserved. Skull and upper cervical spine: No suspicious marrow lesion. Sinuses/Orbits: Unremarkable orbits. Paranasal sinuses and mastoid air cells are clear. Other: None. IMPRESSION: 1. No acute intracranial abnormality. 2. Moderate chronic small vessel ischemic disease and cerebral atrophy. Electronically Signed   By: Zenia Resides  Jeralyn Ruths M.D.   On: 08/15/2017 11:38   Dg Chest Port 1  View  Result Date: 08/14/2017 CLINICAL DATA:  62 y/o  F; altered mental status. EXAM: PORTABLE CHEST 1 VIEW COMPARISON:  04/05/2017 chest radiograph. FINDINGS: Stable heart size and mediastinal contours are within normal limits. Both lungs are clear. The visualized skeletal structures are unremarkable. IMPRESSION: No active disease. Electronically Signed   By: Kristine Garbe M.D.   On: 08/14/2017 22:58         Discharge Exam: Vitals:   08/16/17 2206 08/17/17 0528  BP: (!) 157/82 (!) 170/83  Pulse: 73 76  Resp: 20 14  Temp: 98.3 F (36.8 C) 98.6 F (37 C)  SpO2: 97% 97%   Vitals:   08/15/17 2317 08/16/17 1321 08/16/17 2206 08/17/17 0528  BP: 127/68 (!) 152/98 (!) 157/82 (!) 170/83  Pulse: 77 72 73 76  Resp: 16 18 20 14   Temp: 99.1 F (37.3 C) 98.2 F (36.8 C) 98.3 F (36.8 C) 98.6 F (37 C)  TempSrc: Oral Oral Oral Oral  SpO2: 94% 100% 97% 97%  Weight:      Height:        General: Pt is alert, awake, not in acute distress Cardiovascular: RRR, S1/S2 +, no rubs, no gallops Respiratory: Bibasilar rales.  No wheezing.  Good air movement. Abdominal: Soft, NT, ND, bowel sounds + Extremities: no edema, no cyanosis   The results of significant diagnostics from this hospitalization (including imaging, microbiology, ancillary and laboratory) are listed below for reference.    Significant Diagnostic Studies: Ct Head Wo Contrast  Result Date: 08/15/2017 CLINICAL DATA:  Somnolent. Status post polyp removed yesterday. History of headache, stroke. EXAM: CT HEAD WITHOUT CONTRAST TECHNIQUE: Contiguous axial images were obtained from the base of the skull through the vertex without intravenous contrast. COMPARISON:  MRI of the head Jun 10, 2017 FINDINGS: BRAIN: No intraparenchymal hemorrhage, mass effect nor midline shift. Moderate to severe parenchymal brain volume loss. Patchy supratentorial white matter hypodensities within normal range for patient's age, though non-specific  are most compatible with chronic small vessel ischemic disease. Old small RIGHT cerebellar infarct. No acute large vascular territory infarcts. No abnormal extra-axial fluid collections. Basal cisterns are patent. VASCULAR: Mild calcific atherosclerosis of the carotid siphons. SKULL: No skull fracture. No significant scalp soft tissue swelling. SINUSES/ORBITS: The mastoid air-cells and included paranasal sinuses are well-aerated.The included ocular globes and orbital contents are non-suspicious. OTHER: None. IMPRESSION: 1. No acute intracranial process. 2. Moderate to severe parenchymal brain volume loss. 3. Mild chronic small vessel ischemic changes. Old small RIGHT cerebellar infarct. Electronically Signed   By: Elon Alas M.D.   On: 08/15/2017 00:04   Mr Brain Wo Contrast  Result Date: 08/15/2017 CLINICAL DATA:  Altered mental status.  Dysphasia. EXAM: MRI HEAD WITHOUT CONTRAST TECHNIQUE: Multiplanar, multiecho pulse sequences of the brain and surrounding structures were obtained without intravenous contrast. COMPARISON:  Head CT 08/14/2017 and MRI 06/10/2017 FINDINGS: Brain: A 5 mm focus of persistent trace diffusion signal abnormality is noted in the left parietal periventricular white matter without reduced ADC (the same location of the infarct described on the prior MRI). There is no evidence of acute infarct, intracranial hemorrhage, mass, midline shift, or extra-axial fluid collection. Moderately advanced central predominant cerebral atrophy is noted. Patchy deep cerebral white matter T2 hyperintensities are similar to the prior MRI allowing for differences in technique and are again noted to be asymmetrically greater on the right, nonspecific but compatible with moderate chronic  small vessel ischemic disease. Small chronic infarcts are again seen in the right cerebellum and posterior left temporal stem. Vascular: Major intracranial vascular flow voids are preserved. Skull and upper cervical  spine: No suspicious marrow lesion. Sinuses/Orbits: Unremarkable orbits. Paranasal sinuses and mastoid air cells are clear. Other: None. IMPRESSION: 1. No acute intracranial abnormality. 2. Moderate chronic small vessel ischemic disease and cerebral atrophy. Electronically Signed   By: Logan Bores M.D.   On: 08/15/2017 11:38   Dg Chest Port 1 View  Result Date: 08/14/2017 CLINICAL DATA:  62 y/o  F; altered mental status. EXAM: PORTABLE CHEST 1 VIEW COMPARISON:  04/05/2017 chest radiograph. FINDINGS: Stable heart size and mediastinal contours are within normal limits. Both lungs are clear. The visualized skeletal structures are unremarkable. IMPRESSION: No active disease. Electronically Signed   By: Kristine Garbe M.D.   On: 08/14/2017 22:58     Microbiology: Recent Results (from the past 240 hour(s))  Blood Culture (routine x 2)     Status: None (Preliminary result)   Collection Time: 08/15/17 12:44 AM  Result Value Ref Range Status   Specimen Description BLOOD RIGHT ARM  Final   Special Requests   Final    BOTTLES DRAWN AEROBIC AND ANAEROBIC Blood Culture adequate volume   Culture   Final    NO GROWTH 2 DAYS Performed at [facility], [address], [place], [place] [address]    Report Status PENDING  Incomplete  Blood Culture (routine x 2)     Status: None (Preliminary result)   Collection Time: 08/15/17 12:48 AM  Result Value Ref Range Status   Specimen Description BLOOD RIGHT ARM  Final   Special Requests   Final    BOTTLES DRAWN AEROBIC AND ANAEROBIC Blood Culture adequate volume   Culture   Final    NO GROWTH 2 DAYS Performed at [facility], [address], [place], [place] [address]    Report Status PENDING  Incomplete  MRSA PCR Screening     Status: None   Collection Time: 08/15/17  2:09 AM  Result Value Ref Range Status   MRSA by PCR NEGATIVE NEGATIVE Final    Comment:        The GeneXpert MRSA Assay (FDA approved for NASAL specimens only), is one component  of a comprehensive MRSA colonization surveillance program. It is not intended to diagnose MRSA infection nor to guide or monitor treatment for MRSA infections. Performed at [facility], [address], [place], [place] [address]      Labs: Basic Metabolic Panel: Recent Labs  Lab 08/14/17 2310 08/15/17 0359 08/16/17 0628 08/17/17 0602  NA 128* 134* 132* 130*  K 3.4* 3.8 3.9 3.2*  CL 91* 102 99 93*  CO2 30 28 28 29   GLUCOSE 125* 97 92 96  BUN 8 6* 7* 7*  CREATININE 0.57 0.43* 0.47 0.37*  CALCIUM 9.0 8.3* 9.2 9.0  MG 1.5*  --  1.5* 1.4*  PHOS 2.5  --   --   --    Liver Function Tests: Recent Labs  Lab 08/14/17 2310 08/15/17 0359  AST 15 10*  ALT 11 8  ALKPHOS 97 72  BILITOT 0.5 0.8  PROT 6.9 5.2*  ALBUMIN 3.2* 2.4*   No results for input(s): LIPASE, AMYLASE in the last 168 hours. Recent Labs  Lab 08/14/17 2310  AMMONIA 31   CBC: Recent Labs  Lab 08/14/17 2310 08/15/17 0359 08/16/17 0628 08/17/17 0602  WBC 20.7* 17.8* 16.0* 9.1  NEUTROABS 16.7*  14.1* 12.8* 6.4  HGB 12.8 10.6* 11.1* 11.2*  HCT 38.7 32.4* 34.0* 34.1*  MCV 92.4 92.3 92.6 91.4  PLT 156 135* 134* 152   Cardiac Enzymes: No results for input(s): CKTOTAL, CKMB, CKMBINDEX, TROPONINI in the last 168 hours. BNP: Invalid input(s): POCBNP CBG: Recent Labs  Lab 08/15/17 1728 08/16/17 0224 08/16/17 0527 08/16/17 1210 08/16/17 1609  GLUCAP 121* 107* 81 85 106*    Time coordinating discharge:  36 minutes  Signed:  Orson Eva, DO [facility] Pager: [phone] 08/17/2017, 10:24 AM

## 2017-08-20 LAB — CULTURE, BLOOD (ROUTINE X 2)
Culture: NO GROWTH
Culture: NO GROWTH
Special Requests: ADEQUATE
Special Requests: ADEQUATE

## 2017-09-17 ENCOUNTER — Ambulatory Visit: Payer: Medicaid Other | Admitting: Orthopedic Surgery

## 2017-10-30 ENCOUNTER — Encounter: Payer: Self-pay | Admitting: Gastroenterology

## 2017-10-30 ENCOUNTER — Ambulatory Visit (INDEPENDENT_AMBULATORY_CARE_PROVIDER_SITE_OTHER): Payer: Medicaid Other | Admitting: Gastroenterology

## 2017-10-30 VITALS — BP 108/66 | HR 71 | Temp 96.6°F | Ht 65.0 in | Wt 111.4 lb

## 2017-10-30 DIAGNOSIS — K621 Rectal polyp: Secondary | ICD-10-CM | POA: Diagnosis not present

## 2017-10-30 DIAGNOSIS — R300 Dysuria: Secondary | ICD-10-CM | POA: Diagnosis not present

## 2017-10-30 DIAGNOSIS — K219 Gastro-esophageal reflux disease without esophagitis: Secondary | ICD-10-CM

## 2017-10-30 NOTE — Assessment & Plan Note (Signed)
Due for surveillance exam in December with Dr. Cheryll Cockayne.  Follow-up with him.

## 2017-10-30 NOTE — Assessment & Plan Note (Signed)
Doing well on current regimen .  

## 2017-10-30 NOTE — Assessment & Plan Note (Signed)
Urinary frequency.  Check urinalysis with culture.

## 2017-10-30 NOTE — Progress Notes (Signed)
cc'ed to pcp °

## 2017-10-30 NOTE — Patient Instructions (Signed)
1. Urine specimen to check for infection. 2. Return to Dr. Morton Stall as scheduled. 3. Return here as needed.

## 2017-10-30 NOTE — Progress Notes (Signed)
Primary Care Physician: Neale Burly, MD  Primary Gastroenterologist:  Barney Drain, MD   Chief Complaint  Patient presents with  . dysplastic rectal polyp    HPI: Cassidy Smith is a 62 y.o. female here for follow-up.  She underwent transanal endoscopic microsurgery removal of a 5 cm polyp from the mid rectum.  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  She is supposed to go back in December for surveillance exam by Dr. Morton Stall.  She had oral surgery two weeks ago. Completed antibiotics in the past couple of days.   She denies any complaints as far as diarrhea or constipation.  No blood in the stool or melena.  No abdominal pain.  Appetite okay.  She started having urinary frequency, has been to urinate 5 times in the past 45 minutes. Denies dysuria.   heartburn is well controlled on PPI.   Current Outpatient Medications  Medication Sig Dispense Refill  . aspirin 81 MG chewable tablet Chew by mouth daily.    . Multiple Vitamin (MULTIVITAMIN WITH MINERALS) TABS tablet Take 1 tablet by mouth daily.    Marland Kitchen atorvastatin (LIPITOR) 20 MG tablet Take 1 tablet (20 mg total) by mouth daily at 6 PM. 30 tablet 1  . benztropine (COGENTIN) 1 MG tablet Take 1 tablet (1 mg total) by mouth daily.    . Calcium Carb-Cholecalciferol [phone] MG-UNIT CAPS Take 1 capsule by mouth daily.    . divalproex (DEPAKOTE ER) 500 MG 24 hr tablet Take 2 tablets (1,000 mg total) by mouth at bedtime. 60 tablet 1  . furosemide (LASIX) 20 MG tablet Take 1 tablet (20 mg total) by mouth daily. (Patient taking differently: Take 10 mg by mouth daily. )    . gabapentin (NEURONTIN) 100 MG capsule Take 1 capsule (100 mg total) by mouth 3 (three) times daily. 90 capsule 2  . magnesium oxide (MAG-OX) 400 (241.3 Mg) MG tablet Take 1 tablet (400 mg total) by mouth daily. 30 tablet 0  . methimazole (TAPAZOLE) 10 MG tablet Take 10 mg by mouth every other day.   0  . nicotine  (NICODERM CQ - DOSED IN MG/24 HOURS) 21 mg/24hr patch Place 21 mg onto the skin daily.    . pantoprazole (PROTONIX) 40 MG tablet Take 40 mg by mouth daily.    . potassium chloride (K-DUR,KLOR-CON) 10 MEQ tablet Take 10 mEq by mouth daily.     . sodium chloride 1 g tablet Take 2 tablets (2 g total) by mouth 3 (three) times daily with meals. (Patient taking differently: Take 1 g by mouth 3 (three) times daily with meals. )    . traZODone (DESYREL) 100 MG tablet Take 100 mg by mouth at bedtime.     . vitamin B-12 (CYANOCOBALAMIN) 1000 MCG tablet Take 1 tablet (1,000 mcg total) by mouth daily. 30 tablet 1   No current facility-administered medications for this visit.     Allergies as of 10/30/2017  . (No Known Allergies)    ROS:  General: Negative for anorexia, weight loss, fever, chills, fatigue, weakness. ENT: Negative for hoarseness, difficulty swallowing , nasal congestion. CV: Negative for chest pain, angina, palpitations, dyspnea on exertion, peripheral edema.  Respiratory: Negative for dyspnea at rest, dyspnea on exertion, cough, sputum, wheezing.  GI: See history of present illness. GU:  Negative for dysuria, hematuria, urinary incontinence, urinary frequency, nocturnal urination.  Endo: Negative for unusual weight change.    Physical Examination:  BP 108/66   Pulse 71   Temp (!) 96.6 F (35.9 C) (Oral)   Ht 5\' 5"  (1.651 m)   Wt 111 lb 6.4 oz (50.5 kg)   BMI 18.54 kg/m   General: Well-nourished, well-developed in no acute distress.  Eyes: No icterus. Mouth: Oropharyngeal mucosa moist and pink , no lesions erythema or exudate. Lungs: Clear to auscultation bilaterally.  Heart: Regular rate and rhythm, no murmurs rubs or gallops.  Abdomen: Bowel sounds are normal, nontender, nondistended, no hepatosplenomegaly or masses, no abdominal bruits or hernia , no rebound or guarding.   Extremities: No lower extremity edema. No clubbing or deformities. Neuro: Alert and oriented x 4    Skin: Warm and dry, no jaundice.   Psych: Alert and cooperative, normal mood and affect.  Labs:  Lab Results  Component Value Date   CREATININE 0.37 (L) 08/17/2017   BUN 7 (L) 08/17/2017   NA 130 (L) 08/17/2017   K 3.2 (L) 08/17/2017   CL 93 (L) 08/17/2017   CO2 29 08/17/2017   Lab Results  Component Value Date   ALT 8 08/15/2017   AST 10 (L) 08/15/2017   ALKPHOS 72 08/15/2017   BILITOT 0.8 08/15/2017   Lab Results  Component Value Date   WBC 9.1 08/17/2017   HGB 11.2 (L) 08/17/2017   HCT 34.1 (L) 08/17/2017   MCV 91.4 08/17/2017   PLT 152 08/17/2017   Lab Results  Component Value Date   IRON 44 03/06/2017   TIBC 283 03/06/2017   FERRITIN 102 03/06/2017    Imaging Studies: No results found.

## 2018-01-01 ENCOUNTER — Inpatient Hospital Stay (HOSPITAL_COMMUNITY)
Admission: EM | Admit: 2018-01-01 | Discharge: 2018-01-05 | DRG: 689 | Disposition: A | Discharge status: Home Health | Payer: Medicaid Other | Attending: Family Medicine | Admitting: Family Medicine

## 2018-01-01 ENCOUNTER — Other Ambulatory Visit: Payer: Self-pay

## 2018-01-01 ENCOUNTER — Encounter (HOSPITAL_COMMUNITY): Payer: Self-pay | Admitting: Emergency Medicine

## 2018-01-01 ENCOUNTER — Emergency Department (HOSPITAL_COMMUNITY): Payer: Medicaid Other

## 2018-01-01 DIAGNOSIS — Z8673 Personal history of transient ischemic attack (TIA), and cerebral infarction without residual deficits: Secondary | ICD-10-CM

## 2018-01-01 DIAGNOSIS — Z7982 Long term (current) use of aspirin: Secondary | ICD-10-CM

## 2018-01-01 DIAGNOSIS — R296 Repeated falls: Secondary | ICD-10-CM | POA: Diagnosis present

## 2018-01-01 DIAGNOSIS — Z1612 Extended spectrum beta lactamase (ESBL) resistance: Secondary | ICD-10-CM | POA: Diagnosis present

## 2018-01-01 DIAGNOSIS — E0590 Thyrotoxicosis, unspecified without thyrotoxic crisis or storm: Secondary | ICD-10-CM | POA: Diagnosis present

## 2018-01-01 DIAGNOSIS — K219 Gastro-esophageal reflux disease without esophagitis: Secondary | ICD-10-CM | POA: Diagnosis present

## 2018-01-01 DIAGNOSIS — F419 Anxiety disorder, unspecified: Secondary | ICD-10-CM | POA: Diagnosis present

## 2018-01-01 DIAGNOSIS — Z87891 Personal history of nicotine dependence: Secondary | ICD-10-CM

## 2018-01-01 DIAGNOSIS — F319 Bipolar disorder, unspecified: Secondary | ICD-10-CM | POA: Diagnosis present

## 2018-01-01 DIAGNOSIS — E860 Dehydration: Secondary | ICD-10-CM | POA: Diagnosis present

## 2018-01-01 DIAGNOSIS — Z79899 Other long term (current) drug therapy: Secondary | ICD-10-CM

## 2018-01-01 DIAGNOSIS — T426X1A Poisoning by other antiepileptic and sedative-hypnotic drugs, accidental (unintentional), initial encounter: Secondary | ICD-10-CM | POA: Diagnosis present

## 2018-01-01 DIAGNOSIS — Z8659 Personal history of other mental and behavioral disorders: Secondary | ICD-10-CM

## 2018-01-01 DIAGNOSIS — G709 Myoneural disorder, unspecified: Secondary | ICD-10-CM | POA: Diagnosis present

## 2018-01-01 DIAGNOSIS — B9620 Unspecified Escherichia coli [E. coli] as the cause of diseases classified elsewhere: Secondary | ICD-10-CM | POA: Diagnosis present

## 2018-01-01 DIAGNOSIS — G9341 Metabolic encephalopathy: Secondary | ICD-10-CM | POA: Diagnosis present

## 2018-01-01 DIAGNOSIS — N189 Chronic kidney disease, unspecified: Secondary | ICD-10-CM | POA: Diagnosis present

## 2018-01-01 DIAGNOSIS — R32 Unspecified urinary incontinence: Secondary | ICD-10-CM | POA: Diagnosis present

## 2018-01-01 DIAGNOSIS — N390 Urinary tract infection, site not specified: Principal | ICD-10-CM | POA: Diagnosis present

## 2018-01-01 DIAGNOSIS — T426X5A Adverse effect of other antiepileptic and sedative-hypnotic drugs, initial encounter: Secondary | ICD-10-CM | POA: Diagnosis present

## 2018-01-01 DIAGNOSIS — R410 Disorientation, unspecified: Secondary | ICD-10-CM

## 2018-01-01 LAB — URINALYSIS, ROUTINE W REFLEX MICROSCOPIC
BILIRUBIN URINE: NEGATIVE
GLUCOSE, UA: NEGATIVE mg/dL
Hgb urine dipstick: NEGATIVE
KETONES UR: NEGATIVE mg/dL
Nitrite: POSITIVE — AB
PROTEIN: NEGATIVE mg/dL
Specific Gravity, Urine: 1.017 (ref 1.005–1.030)
pH: 6 (ref 5.0–8.0)

## 2018-01-01 LAB — CBC WITH DIFFERENTIAL/PLATELET
Abs Immature Granulocytes: 0.05 10*3/uL (ref 0.00–0.07)
Basophils Absolute: 0 10*3/uL (ref 0.0–0.1)
Basophils Relative: 0 %
Eosinophils Absolute: 0 10*3/uL (ref 0.0–0.5)
Eosinophils Relative: 0 %
HCT: 36.7 % (ref 36.0–46.0)
HEMOGLOBIN: 11.7 g/dL — AB (ref 12.0–15.0)
Immature Granulocytes: 0 %
LYMPHS PCT: 14 %
Lymphs Abs: 1.7 10*3/uL (ref 0.7–4.0)
MCH: 30.3 pg (ref 26.0–34.0)
MCHC: 31.9 g/dL (ref 30.0–36.0)
MCV: 95.1 fL (ref 80.0–100.0)
MONO ABS: 2.1 10*3/uL — AB (ref 0.1–1.0)
MONOS PCT: 17 %
Neutro Abs: 8.8 10*3/uL — ABNORMAL HIGH (ref 1.7–7.7)
Neutrophils Relative %: 69 %
Platelets: 112 10*3/uL — ABNORMAL LOW (ref 150–400)
RBC: 3.86 MIL/uL — ABNORMAL LOW (ref 3.87–5.11)
RDW: 16.1 % — ABNORMAL HIGH (ref 11.5–15.5)
WBC: 12.8 10*3/uL — ABNORMAL HIGH (ref 4.0–10.5)
nRBC: 0 % (ref 0.0–0.2)

## 2018-01-01 LAB — COMPREHENSIVE METABOLIC PANEL
ALBUMIN: 3.2 g/dL — AB (ref 3.5–5.0)
ALT: 10 U/L (ref 0–44)
AST: 15 U/L (ref 15–41)
Alkaline Phosphatase: 105 U/L (ref 38–126)
Anion gap: 8 (ref 5–15)
BUN: 22 mg/dL (ref 8–23)
CHLORIDE: 96 mmol/L — AB (ref 98–111)
CO2: 28 mmol/L (ref 22–32)
Calcium: 9.4 mg/dL (ref 8.9–10.3)
Creatinine, Ser: 0.65 mg/dL (ref 0.44–1.00)
GFR calc Af Amer: >60 mL/min (ref >60)
GFR calc non Af Amer: >60 mL/min (ref >60)
GLUCOSE: 150 mg/dL — AB (ref 70–99)
POTASSIUM: 3.8 mmol/L (ref 3.5–5.1)
Sodium: 132 mmol/L — ABNORMAL LOW (ref 135–145)
Total Bilirubin: 0.5 mg/dL (ref 0.3–1.2)
Total Protein: 7 g/dL (ref 6.5–8.1)

## 2018-01-01 MED ORDER — SODIUM CHLORIDE 0.9 % IV SOLN: INTRAVENOUS | Status: DC

## 2018-01-01 MED ORDER — SODIUM CHLORIDE 0.9 % IV SOLN
1.0000 g | INTRAVENOUS | Status: DC
  Administered 2018-01-02 – 2018-01-04 (×2): 1 g via INTRAVENOUS
  Filled 2018-01-01 (×2): qty 1
  Filled 2018-01-01: qty 10

## 2018-01-01 MED ORDER — SODIUM CHLORIDE 0.9 % IV SOLN: 250.0000 mL | INTRAVENOUS | Status: DC | PRN

## 2018-01-01 MED ORDER — SODIUM CHLORIDE 0.9 % IV SOLN
1.0000 g | Freq: Once | INTRAVENOUS | Status: AC
  Administered 2018-01-01: 1 g via INTRAVENOUS
  Filled 2018-01-01: qty 10

## 2018-01-01 MED ORDER — SODIUM CHLORIDE 0.9% FLUSH: 3.0000 mL | INTRAVENOUS | Status: DC | PRN

## 2018-01-01 MED ORDER — SODIUM CHLORIDE 0.9% FLUSH
3.0000 mL | Freq: Two times a day (BID) | INTRAVENOUS | Status: DC
  Administered 2018-01-02 – 2018-01-05 (×5): 3 mL via INTRAVENOUS

## 2018-01-01 NOTE — ED Provider Notes (Signed)
[facility] EMERGENCY DEPARTMENT Provider Note   CSN: [id number] Arrival date & time: 01/01/18  2007     History   Chief Complaint Chief Complaint  Patient presents with  . Altered Mental Status    HPI Cassidy Smith is a 62 y.o. female.  HPI  Patient presents via EMS after an episode of altered mental status. EMS notes the patient was with family members when she was noticed to have sudden change in behavior. Reportedly she was unresponsive, but more properly, it seems that the patient became noninteractive. She does have history of bipolar disorder, as well as seizures, anxiety, and multiple other medical issues. Patient herself nods occasionally, inconsistently, but does follow some commands, again inconsistently. EMS notes the patient was normotensive in route, but essentially noncommunicative, however following commands appropriately. Level 5 caveat secondary to patient's mental status.   After the initial evaluation and physical exam, and discussion with EMS, the patient's cousin arrives. She notes the patient has after mentioned bipolar disorder, there is been ongoing effort with her neurologist and psychiatrist to titrate medication including Depakote and risperidone, which is not currently being provided. She notes the patient has had similar episodes in the past, but also notes the patient is incapable of writing any details of her own story. Patient does wear diapers, has difficulty with hygiene.   Past Medical History:  Diagnosis Date  . Anxiety   . Bipolar 1 disorder ([place])   . Chronic hip pain   . Chronic kidney disease    uti  currently,  hx bladder spasms  . Depression   . Headache(784.0)   . Hyperthyroidism   . Ischemic stroke ([place])   . Neuromuscular disorder (HCC)    shaking of hands   . 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 Dysuria 10/30/2017  . Fever  08/15/2017  . Hypokalemia 08/15/2017  . Altered mental state 08/15/2017  . Acute metabolic encephalopathy 27/25/3664  . Valproic acid toxicity 08/15/2017  . Altered mental status   . Ischemic stroke ([place])   . GERD (gastroesophageal reflux disease) 05/12/2017  . GI bleed   . 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 Dysplastic rectal polyp   . Protein-calorie malnutrition, severe 04/07/2017  . Pressure injury of skin 04/04/2017  . Anticoagulated 04/03/2017  . Stool bloody 04/03/2017  . Current every day smoker 04/03/2017  . Rectal bleeding   . Chronic diarrhea   . Hyperthyroidism   . S/P ORIF (open reduction internal fixation) fracture right hip IM nail 03/07/17 03/24/2017  . Hypomagnesemia 03/07/2017  . Fall   . Closed intertrochanteric fracture of hip, right, initial encounter ([place]) 03/06/2017  . Radial styloid fracture: right 03/06/2017  . Anemia 03/06/2017  . Orthostatic hypotension 06/07/2015  . UTI (urinary tract infection) 06/07/2015  . Rhabdomyolysis 06/07/2015  . White matter abnormality on MRI of brain 05/14/2013  . History of depression 05/14/2013  . Hx of anxiety disorder 05/14/2013  . Bipolar I disorder, most recent episode (or current) manic, unspecified 05/14/2013  . Bipolar 1 disorder ([place]) 05/14/2013  . Hyponatremia 05/12/2013  . Bipolar disorder ([place]) 05/12/2013  . Lacunar infarct, acute ([place]) 05/12/2013    Past Surgical History:  Procedure Laterality Date  . COLONOSCOPY WITH PROPOFOL N/A 04/05/2017   Procedure: COLONOSCOPY WITH PROPOFOL;  Surgeon: Rogene Houston, MD;  Location[facility];  Service: Endoscopy;  Laterality: N/A;  .  INTRAMEDULLARY (IM) NAIL INTERTROCHANTERIC Right 03/07/2017   Procedure: OPEN TREATMENT INTERNAL FIXATION RIGHT HIP WITH GAMA INTRAMEDULARY NAIL;  Surgeon: Carole Civil, MD;  Location: [facility] ORS;  Service: Orthopedics;  Laterality: Right;  . MULTIPLE EXTRACTIONS WITH ALVEOLOPLASTY N/A 10/30/2012     Procedure: MULTIPLE EXTRACION 5, 6, 8, 9, 10 ,18, 19, 31 WITH MAXILLARY RIGHT AND LEFT  ALVEOLOPLASTY REDUCE MAXILLARY LEFT TUBEROSITY;  Surgeon: Gae Bon, DDS;  Location: [facility];  Service: Oral Surgery;  Laterality: N/A;  . POLYPECTOMY  04/05/2017   Procedure: POLYPECTOMY;  Surgeon: Rogene Houston, MD;  Location[facility];  Service: Endoscopy;;  recto-sigmoid, rectum     OB History   No obstetric history on file.      Home Medications    Prior to Admission medications   Medication Sig Start Date End Date Taking? Authorizing Provider  aspirin 81 MG chewable tablet Chew by mouth daily.    [provider]  atorvastatin (LIPITOR) 20 MG tablet Take 1 tablet (20 mg total) by mouth daily at 6 PM. 06/11/17   Barton Dubois, MD  benztropine (COGENTIN) 1 MG tablet Take 1 tablet (1 mg total) by mouth daily. 06/11/17   Barton Dubois, MD  Calcium Carb-Cholecalciferol [phone] MG-UNIT CAPS Take 1 capsule by mouth daily.    [provider]  divalproex (DEPAKOTE ER) 500 MG 24 hr tablet Take 2 tablets (1,000 mg total) by mouth at bedtime. 08/16/17   Orson Eva, MD  furosemide (LASIX) 20 MG tablet Take 1 tablet (20 mg total) by mouth daily. Patient taking differently: Take 10 mg by mouth daily.  04/18/17   Barton Dubois, MD  gabapentin (NEURONTIN) 100 MG capsule Take 1 capsule (100 mg total) by mouth 3 (three) times daily. 05/13/17   Carole Civil, MD  magnesium oxide (MAG-OX) 400 (241.3 Mg) MG tablet Take 1 tablet (400 mg total) by mouth daily. 08/17/17   Orson Eva, MD  methimazole (TAPAZOLE) 10 MG tablet Take 10 mg by mouth every other day.  12/24/15   [provider]  Multiple Vitamin (MULTIVITAMIN WITH MINERALS) TABS tablet Take 1 tablet by mouth daily.    [provider]  nicotine (NICODERM CQ - DOSED IN MG/24 HOURS) 21 mg/24hr patch Place 21 mg onto the skin daily.    [provider]  pantoprazole (PROTONIX) 40 MG tablet Take 40 mg by mouth  daily.    [provider]  potassium chloride (K-DUR,KLOR-CON) 10 MEQ tablet Take 10 mEq by mouth daily.     [provider]  sodium chloride 1 g tablet Take 2 tablets (2 g total) by mouth 3 (three) times daily with meals. Patient taking differently: Take 1 g by mouth 3 (three) times daily with meals.  04/17/17   Barton Dubois, MD  traZODone (DESYREL) 100 MG tablet Take 100 mg by mouth at bedtime.     [provider]  vitamin B-12 (CYANOCOBALAMIN) 1000 MCG tablet Take 1 tablet (1,000 mcg total) by mouth daily. 06/11/17   Barton Dubois, MD    Family History Family History  Problem Relation Age of Onset  . Breast cancer Mother   . Cancer - Other Mother   . Alcoholism Father   . Colon cancer Neg Hx   . Colon polyps Neg Hx     Social History Social History   Tobacco Use  . Smoking status: Former Smoker    Packs/day: 0.50    Years: 20.00    Pack years: 10.00  Types: Cigarettes    Last attempt to quit: 05/05/2017    Years since quitting: 0.6  . Smokeless tobacco: Never Used  . Tobacco comment: patient states she quit one week ago  Substance Use Topics  . Alcohol use: No  . Drug use: No     Allergies   Patient has no known allergies.   Review of Systems Review of Systems  Unable to perform ROS: Mental status change     Physical Exam Updated Vital Signs BP 117/87   Pulse 84   Temp 98.6 F (37 C) (Oral)   Resp (!) 25   SpO2 97%   Physical Exam Vitals signs and nursing note reviewed.  Constitutional:      General: She is not in acute distress.    Comments: Sickly appearing female occasionally interacting, largely entirely withdrawn.  HENT:     Head: Normocephalic and atraumatic.  Eyes:     Conjunctiva/sclera: Conjunctivae normal.  Cardiovascular:     Rate and Rhythm: Normal rate and regular rhythm.  Pulmonary:     Effort: Pulmonary effort is normal. No respiratory distress.     Breath sounds: Normal breath sounds. No stridor.    Abdominal:     General: There is no distension.  Skin:    General: Skin is warm and dry.  Neurological:     Cranial Nerves: No cranial nerve deficit.     Motor: Atrophy present.     Comments: Though the patient does not offer verbal responses, and only interacts inconsistently, she does follow commands generally appropriately, she moves all extremities spontaneously.  Psychiatric:        Behavior: Behavior is withdrawn.        Cognition and Memory: Cognition is impaired.      ED Treatments / Results  Labs (all labs ordered are listed, but only abnormal results are displayed) Labs Reviewed  COMPREHENSIVE METABOLIC PANEL - Abnormal; Notable for the following components:      Result Value   Sodium 132 (*)    Chloride 96 (*)    Glucose, Bld 150 (*)    Albumin 3.2 (*)    All other components within normal limits  CBC WITH DIFFERENTIAL/PLATELET - Abnormal; Notable for the following components:   WBC 12.8 (*)    RBC 3.86 (*)    Hemoglobin 11.7 (*)    RDW 16.1 (*)    Platelets 112 (*)    Neutro Abs 8.8 (*)    Monocytes Absolute 2.1 (*)    All other components within normal limits  URINALYSIS, ROUTINE W REFLEX MICROSCOPIC - Abnormal; Notable for the following components:   Nitrite POSITIVE (*)    Leukocytes, UA SMALL (*)    Bacteria, UA RARE (*)    All other components within normal limits  URINE CULTURE    EKG EKG Interpretation  Date/Time:  Thursday January 01 2018 20:15:29 EST Ventricular Rate:  82 PR Interval:    QRS Duration: 84 QT Interval:  356 QTC Calculation: 416 R Axis:   34 Text Interpretation:  Sinus rhythm Anterior infarct, old Artifact Abnormal ekg Confirmed by Carmin Muskrat [phone]) on 01/01/2018 10:39:00 PM   Radiology No results found.  Procedures Procedures (including critical care time)  Medications Ordered in ED Medications  cefTRIAXone (ROCEPHIN) 1 g in sodium chloride 0.9 % 100 mL IVPB (has no administration in time range)      Initial Impression / Assessment and Plan / ED Course  I have reviewed the triage vital signs  and the nursing notes.  Pertinent labs & imaging results that were available during my care of the patient were reviewed by me and considered in my medical decision making (see chart for details).    Findings notable for evidence for urinary tract infection. Patient cannot describe if she has symptoms or not. Cousin notes the patient has poor hygiene, with depends undergarments required.  This elderly female with bipolar disorder, multiple other medical issues presents after episode of altered mental status, possible seizure versus syncope, versus infection. Patient found to have UTI. No evidence for new intracranial pathology, no hemodynamic instability, but given the altered mental status, infection, the patient will receive antibiotics, be admitted for further monitoring, management.  Final Clinical Impressions(s) / ED Diagnoses  Altered mental status Urinary tract infection   Carmin Muskrat, MD 01/01/18 2316

## 2018-01-01 NOTE — ED Triage Notes (Signed)
Pt from home with husband. Per ems. Pt took a type of med per husband, not kowing what and went unconsciouses so called EMS. Pt arrived alert/oriented to some. cbg 138. Pt lethargic. Knows name but not place/time/situation.

## 2018-01-01 NOTE — ED Notes (Signed)
Cousin at bedside. And states pt only took what she normally takes at night. Cousin found her slumped over the table but was able to squeeze her cousins hand. Pt was never unconscious. Unknown any seizure activity per cousin. Mild tremors to full body noted but able to talk

## 2018-01-02 ENCOUNTER — Other Ambulatory Visit: Payer: Self-pay

## 2018-01-02 ENCOUNTER — Encounter (HOSPITAL_COMMUNITY): Payer: Self-pay

## 2018-01-02 DIAGNOSIS — G9341 Metabolic encephalopathy: Secondary | ICD-10-CM

## 2018-01-02 DIAGNOSIS — N189 Chronic kidney disease, unspecified: Secondary | ICD-10-CM

## 2018-01-02 DIAGNOSIS — N390 Urinary tract infection, site not specified: Principal | ICD-10-CM

## 2018-01-02 LAB — BASIC METABOLIC PANEL
Anion gap: 7 (ref 5–15)
BUN: 22 mg/dL (ref 8–23)
CALCIUM: 8.9 mg/dL (ref 8.9–10.3)
CO2: 28 mmol/L (ref 22–32)
Chloride: 99 mmol/L (ref 98–111)
Creatinine, Ser: 0.45 mg/dL (ref 0.44–1.00)
GFR calc Af Amer: >60 mL/min (ref >60)
GFR calc non Af Amer: >60 mL/min (ref >60)
Glucose, Bld: 81 mg/dL (ref 70–99)
Potassium: 4 mmol/L (ref 3.5–5.1)
Sodium: 134 mmol/L — ABNORMAL LOW (ref 135–145)

## 2018-01-02 LAB — CBC
HCT: 34 % — ABNORMAL LOW (ref 36.0–46.0)
Hemoglobin: 10.8 g/dL — ABNORMAL LOW (ref 12.0–15.0)
MCH: 29.5 pg (ref 26.0–34.0)
MCHC: 31.8 g/dL (ref 30.0–36.0)
MCV: 92.9 fL (ref 80.0–100.0)
Platelets: 101 10*3/uL — ABNORMAL LOW (ref 150–400)
RBC: 3.66 MIL/uL — ABNORMAL LOW (ref 3.87–5.11)
RDW: 16.1 % — ABNORMAL HIGH (ref 11.5–15.5)
WBC: 12 10*3/uL — ABNORMAL HIGH (ref 4.0–10.5)
nRBC: 0 % (ref 0.0–0.2)

## 2018-01-02 LAB — VALPROIC ACID LEVEL: Valproic Acid Lvl: 121 ug/mL — ABNORMAL HIGH (ref 50.0–100.0)

## 2018-01-02 MED ORDER — LORAZEPAM 2 MG/ML IJ SOLN
0.5000 mg | INTRAMUSCULAR | Status: DC | PRN
  Administered 2018-01-02 – 2018-01-05 (×4): 0.5 mg via INTRAVENOUS
  Filled 2018-01-02 (×4): qty 1

## 2018-01-02 MED ORDER — SODIUM CHLORIDE 0.9 % IV SOLN
INTRAVENOUS | Status: DC
  Administered 2018-01-02 – 2018-01-05 (×5): via INTRAVENOUS

## 2018-01-02 NOTE — Progress Notes (Signed)
Patient admitted to the hospital earlier this morning by Dr. Shanon Brow.  Patient seen and examined.  She is awake and alert, she thinks she is in [place].  She does not know the year.  She is pleasant and engages in conversation.  Denies any pain or shortness of breath.  She was admitted to the hospital after possible syncopal event.  She is on multiple psychotropic medications.  She also has possible UTI.  She was started on antibiotics and is on IV fluids.  Initial CT head of the brain was unrevealing.  I tried to reach out to her cousin today in order to obtain more information regarding her medications, but I was unable to reach her by phone.  Dr. Merlene Laughter will not be available until next week.  Continue current treatments.  We will try to reach out to the cousin again tomorrow.  Continue on fluids and antibiotics for now.  [employer]

## 2018-01-02 NOTE — H&P (Signed)
History and Physical    Cassidy Smith NWG:[id number] DOB: 11-30-1955 DOA: 01/01/2018  PCP: Neale Burly, MD  Patient coming from: Home  Chief Complaint: Passed out at table and became unresponsive  HPI: Cassidy Smith is a 62 y.o. female with medical history significant of bipolar disorder, TIA who has had several medical issues including bowel surgery in last 6 months with interruption in many of her psych medications including Depakote and Risperdal.  Family reports that she had a previous episode where she was somnolent and her Depakote levels were higher than what they were supposed to.  Her Depakote Risperdal were stopped and she has had worsening of her bipolar disorder with hallucinating more increasing paranoia and inability to sleep at night.  Her Depakote was started again by Dr. Merlene Laughter in the last couple of weeks.  Family is also called her psychiatrist who deferred her psych meds to Dr. Merlene Laughter.  She has had hardly any sleep going on a week now.  Today they were sitting at the dinner table when she all of a sudden slumped over and became unresponsive but her eyes were open.  There was no seizure-like activity.  She has chronic incontinence of her urine.  She is not had any nausea vomiting or diarrhea.  She is not had any fevers.  At her baseline she walks with a walker and/or a cane.  At her baseline she does have some paranoia but does not usually hallucinate.  At her baseline she sleeps well through the night when she was previously on the Risperdal and Depakote.  Patient is being referred for admission for altered mental status.  Review of Systems: As per HPI otherwise 10 point review of systems negative per cousin  Past Medical History:  Diagnosis Date  . Anxiety   . Bipolar 1 disorder ([place])   . Chronic hip pain   . Chronic kidney disease    uti  currently,  hx bladder spasms  . Depression   . Headache(784.0)   . Hyperthyroidism   . Ischemic stroke ([place])   .  Neuromuscular disorder (HCC)    shaking of hands   . 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 COLONOSCOPY WITH PROPOFOL N/A 04/05/2017   Procedure: COLONOSCOPY WITH PROPOFOL;  Surgeon: Rogene Houston, MD;  [facility];  Service: Endoscopy;  Laterality: N/A;  . INTRAMEDULLARY (IM) NAIL INTERTROCHANTERIC Right 03/07/2017   Procedure: OPEN TREATMENT INTERNAL FIXATION RIGHT HIP WITH GAMA INTRAMEDULARY NAIL;  Surgeon: Carole Civil, MD;  Location: [facility];  Service: Orthopedics;  Laterality: Right;  . MULTIPLE EXTRACTIONS WITH ALVEOLOPLASTY N/A 10/30/2012   Procedure: MULTIPLE EXTRACION 5, 6, 8, 9, 10 ,18, 19, 31 WITH MAXILLARY RIGHT AND LEFT  ALVEOLOPLASTY REDUCE MAXILLARY LEFT TUBEROSITY;  Surgeon: Gae Bon, DDS;  Location: [facility];  Service: Oral Surgery;  Laterality: N/A;  . POLYPECTOMY  04/05/2017   Procedure: POLYPECTOMY;  Surgeon: Rogene Houston, MD;  [facility];  Service: Endoscopy;;  recto-sigmoid, rectum     reports that she quit smoking about 7 months ago. Her smoking use included cigarettes. She has a 10.00 pack-year smoking history. She has never used smokeless tobacco. She reports that she does not drink alcohol or use drugs.  No Known Allergies  Family History  Problem Relation Age of Onset  . Breast cancer Mother   . Cancer - Other Mother   .  Alcoholism Father   . Colon cancer Neg Hx   . Colon polyps Neg Hx     Prior to Admission medications   Medication Sig Start Date End Date Taking? Authorizing Provider  aspirin 81 MG chewable tablet Chew by mouth daily.    [provider]  atorvastatin (LIPITOR) 20 MG tablet Take 1 tablet (20 mg total) by mouth daily at 6 PM. 06/11/17   Barton Dubois, MD  benztropine (COGENTIN) 1 MG tablet Take 1 tablet (1 mg total) by mouth daily. 06/11/17   Barton Dubois, MD  Calcium Carb-Cholecalciferol [phone] MG-UNIT CAPS  Take 1 capsule by mouth daily.    [provider]  divalproex (DEPAKOTE ER) 500 MG 24 hr tablet Take 2 tablets (1,000 mg total) by mouth at bedtime. 08/16/17   Orson Eva, MD  furosemide (LASIX) 20 MG tablet Take 1 tablet (20 mg total) by mouth daily. Patient taking differently: Take 10 mg by mouth daily.  04/18/17   Barton Dubois, MD  gabapentin (NEURONTIN) 100 MG capsule Take 1 capsule (100 mg total) by mouth 3 (three) times daily. 05/13/17   Carole Civil, MD  magnesium oxide (MAG-OX) 400 (241.3 Mg) MG tablet Take 1 tablet (400 mg total) by mouth daily. 08/17/17   Orson Eva, MD  methimazole (TAPAZOLE) 10 MG tablet Take 10 mg by mouth every other day.  12/24/15   [provider]  Multiple Vitamin (MULTIVITAMIN WITH MINERALS) TABS tablet Take 1 tablet by mouth daily.    [provider]  nicotine (NICODERM CQ - DOSED IN MG/24 HOURS) 21 mg/24hr patch Place 21 mg onto the skin daily.    [provider]  pantoprazole (PROTONIX) 40 MG tablet Take 40 mg by mouth daily.    [provider]  potassium chloride (K-DUR,KLOR-CON) 10 MEQ tablet Take 10 mEq by mouth daily.     [provider]  sodium chloride 1 g tablet Take 2 tablets (2 g total) by mouth 3 (three) times daily with meals. Patient taking differently: Take 1 g by mouth 3 (three) times daily with meals.  04/17/17   Barton Dubois, MD  traZODone (DESYREL) 100 MG tablet Take 100 mg by mouth at bedtime.     [provider]  vitamin B-12 (CYANOCOBALAMIN) 1000 MCG tablet Take 1 tablet (1,000 mcg total) by mouth daily. 06/11/17   Barton Dubois, MD    Physical Exam: Vitals:   01/01/18 2015 01/01/18 2030 01/01/18 2100 01/01/18 2300  BP: 136/84 117/87 111/64 92/63  Pulse: 80 84 79 78  Resp: 17 (!) 25 20 (!) 32  Temp: 98.6 F (37 C)     TempSrc: Oral     SpO2: 100% 97% 95% 94%      Constitutional: NAD, calm, comfortable somnolent Vitals:   01/01/18 2015 01/01/18 2030 01/01/18 2100  01/01/18 2300  BP: 136/84 117/87 111/64 92/63  Pulse: 80 84 79 78  Resp: 17 (!) 25 20 (!) 32  Temp: 98.6 F (37 C)     TempSrc: Oral     SpO2: 100% 97% 95% 94%   Eyes: PERRL, lids and conjunctivae normal ENMT: Mucous membranes are moist. Posterior pharynx clear of any exudate or lesions.Normal dentition.  Neck: normal, supple, no masses, no thyromegaly Respiratory: clear to auscultation bilaterally, no wheezing, no crackles. Normal respiratory effort. No accessory muscle use.  Cardiovascular: Regular rate and rhythm, no murmurs / rubs / gallops. No extremity edema. 2+ pedal pulses. No carotid bruits.  Abdomen: no tenderness, no masses  palpated. No hepatosplenomegaly. Bowel sounds positive.  Musculoskeletal: no clubbing / cyanosis. No joint deformity upper and lower extremities. Good ROM, no contractures. Normal muscle tone.  Skin: no rashes, lesions, ulcers. No induration Neurologic: CN 2-12 grossly intact. Sensation intact, DTR normal. Strength 5/5 in all 4.  Psychiatric:  Alert and oriented x 3. Normal mood.    Labs on Admission: I have personally reviewed following labs and imaging studies  CBC: Recent Labs  Lab 01/01/18 2041  WBC 12.8*  NEUTROABS 8.8*  HGB 11.7*  HCT 36.7  MCV 95.1  PLT 947*   Basic Metabolic Panel: Recent Labs  Lab 01/01/18 2041  NA 132*  K 3.8  CL 96*  CO2 28  GLUCOSE 150*  BUN 22  CREATININE 0.65  CALCIUM 9.4   GFR: CrCl cannot be calculated (Unknown ideal weight.). Liver Function Tests: Recent Labs  Lab 01/01/18 2041  AST 15  ALT 10  ALKPHOS 105  BILITOT 0.5  PROT 7.0  ALBUMIN 3.2*   No results for input(s): LIPASE, AMYLASE in the last 168 hours. No results for input(s): AMMONIA in the last 168 hours. Coagulation Profile: No results for input(s): INR, PROTIME in the last 168 hours. Cardiac Enzymes: No results for input(s): CKTOTAL, CKMB, CKMBINDEX, TROPONINI in the last 168 hours. BNP (last 3 results) No results for input(s):  PROBNP in the last 8760 hours. HbA1C: No results for input(s): HGBA1C in the last 72 hours. CBG: No results for input(s): GLUCAP in the last 168 hours. Lipid Profile: No results for input(s): CHOL, HDL, LDLCALC, TRIG, CHOLHDL, LDLDIRECT in the last 72 hours. Thyroid Function Tests: No results for input(s): TSH, T4TOTAL, FREET4, T3FREE, THYROIDAB in the last 72 hours. Anemia Panel: No results for input(s): VITAMINB12, FOLATE, FERRITIN, TIBC, IRON, RETICCTPCT in the last 72 hours. Urine analysis:    Component Value Date/Time   COLORURINE YELLOW 01/01/2018 2045   APPEARANCEUR CLEAR 01/01/2018 2045   LABSPEC 1.017 01/01/2018 2045   PHURINE 6.0 01/01/2018 2045   GLUCOSEU NEGATIVE 01/01/2018 2045   HGBUR NEGATIVE 01/01/2018 2045   BILIRUBINUR NEGATIVE 01/01/2018 2045   KETONESUR NEGATIVE 01/01/2018 2045   PROTEINUR NEGATIVE 01/01/2018 2045   UROBILINOGEN 0.2 05/12/2013 0210   NITRITE POSITIVE (A) 01/01/2018 2045   LEUKOCYTESUR SMALL (A) 01/01/2018 2045   Sepsis Labs: !!!!!!!!!!!!!!!!!!!!!!!!!!!!!!!!!!!!!!!!!!!! @LABRCNTIP (procalcitonin:4,lacticidven:4) )No results found for this or any previous visit (from the past 240 hour(s)).   Radiological Exams on Admission: Ct Head Wo Contrast  Result Date: 01/01/2018 CLINICAL DATA:  Altered LOC EXAM: CT HEAD WITHOUT CONTRAST TECHNIQUE: Contiguous axial images were obtained from the base of the skull through the vertex without intravenous contrast. COMPARISON:  MRI 08/15/2017, CT brain 08/14/2016 FINDINGS: Brain: No acute territorial infarction, hemorrhage, or intracranial mass. Atrophy and small vessel ischemic changes of the white matter. Chronic infarct in the right cerebellum. Stable prominent ventricle size. Vascular: No hyperdense vessels.  Carotid vascular calcification Skull: Normal. Negative for fracture or focal lesion. Sinuses/Orbits: Mild mucosal thickening in the ethmoid sinuses. Other: None IMPRESSION: 1. No CT evidence for acute  intracranial abnormality. 2. Atrophy and mild small vessel ischemic changes of the white matter. Chronic right cerebellar infarct. Electronically Signed   By: Donavan Foil M.D.   On: 01/01/2018 22:45    Old chart reviewed Case discussed with Dr. Vanita Panda in the ED   Assessment/Plan 62 year old female with acute metabolic encephalopathy and UTI Principal Problem:   Acute metabolic encephalopathy-suspect this is multifactorial.  Not clear if she actually lost consciousness  or not during the episode tonight.  Does not sound like a seizure.  Sounds like she is also had interruption of many of her psychiatric medications that is causing exacerbation of her psychiatric illness.  She does not have a UTI which could explain also some of her mental status changes.  Treat below with antibiotics.  Also obtain a neurology consult with Dr. Merlene Laughter who knows her well.  I hesitate to resume her Risperdal as she may have just passed out from exhaustion but we will also check a Depakote level at this time to make sure this is not above therapeutic levels again.  Active Problems:   UTI (urinary tract infection)-IV Rocephin follow-up on urine culture    Hx of anxiety disorder-stable    Bipolar 1 disorder (HCC)-currently on Depakote and has been off her risperidone for weeks    Chronic kidney disease-stable    Neuromuscular disorder (HCC)-stable   Patient's cousin is with her who helps a lot with her care.  She would like to speak to Dr. Merlene Laughter in the morning if possible she is very knowledgeable about the events that have occurred with her in the last 6 months.   DVT prophylaxis: SCDs Code Status: Full Family Communication: Cousin Disposition Plan: 1 to 2 days Consults called: Neurology Admission status: Observation   Nithya Meriweather A MD [facility]  If 7PM-7AM, please contact night-coverage [email or web address] Password [email or web address]  01/02/2018, 12:20 AM

## 2018-01-02 NOTE — Progress Notes (Signed)
RN paged C. Bodenheimer, NP to make him aware patient is confused and becoming more irritable.  Patient requested RN to call her son, but no phone number for son listed.  Patient does not want RN to call brother or cousin.  Patient is alert and oriented to person only and is looking around the room suspiciously.  RN attempted to reorient patient without success, but patient has calmed down some since RN stayed in room with patient.  RN requested something for increased agitation PRN, awaiting response.  P.J. Linus Mako, RN

## 2018-01-03 DIAGNOSIS — F319 Bipolar disorder, unspecified: Secondary | ICD-10-CM

## 2018-01-03 DIAGNOSIS — K219 Gastro-esophageal reflux disease without esophagitis: Secondary | ICD-10-CM | POA: Diagnosis not present

## 2018-01-03 DIAGNOSIS — R410 Disorientation, unspecified: Secondary | ICD-10-CM

## 2018-01-03 DIAGNOSIS — E0590 Thyrotoxicosis, unspecified without thyrotoxic crisis or storm: Secondary | ICD-10-CM

## 2018-01-03 DIAGNOSIS — G9341 Metabolic encephalopathy: Secondary | ICD-10-CM | POA: Diagnosis not present

## 2018-01-03 DIAGNOSIS — T426X1D Poisoning by other antiepileptic and sedative-hypnotic drugs, accidental (unintentional), subsequent encounter: Secondary | ICD-10-CM

## 2018-01-03 LAB — BASIC METABOLIC PANEL WITH GFR
Anion gap: 7 (ref 5–15)
BUN: 18 mg/dL (ref 8–23)
CO2: 28 mmol/L (ref 22–32)
Calcium: 9.1 mg/dL (ref 8.9–10.3)
Chloride: 99 mmol/L (ref 98–111)
Creatinine, Ser: 0.52 mg/dL (ref 0.44–1.00)
GFR calc Af Amer: >60 mL/min
GFR calc non Af Amer: >60 mL/min
Glucose, Bld: 76 mg/dL (ref 70–99)
Potassium: 3.7 mmol/L (ref 3.5–5.1)
Sodium: 134 mmol/L — ABNORMAL LOW (ref 135–145)

## 2018-01-03 LAB — CBC
HCT: 36.4 % (ref 36.0–46.0)
Hemoglobin: 11.5 g/dL — ABNORMAL LOW (ref 12.0–15.0)
MCH: 30 pg (ref 26.0–34.0)
MCHC: 31.6 g/dL (ref 30.0–36.0)
MCV: 95 fL (ref 80.0–100.0)
Platelets: 108 10*3/uL — ABNORMAL LOW (ref 150–400)
RBC: 3.83 MIL/uL — ABNORMAL LOW (ref 3.87–5.11)
RDW: 16 % — AB (ref 11.5–15.5)
WBC: 7 10*3/uL (ref 4.0–10.5)
nRBC: 0 % (ref 0.0–0.2)

## 2018-01-03 LAB — VALPROIC ACID LEVEL: Valproic Acid Lvl: 33 ug/mL — ABNORMAL LOW (ref 50.0–100.0)

## 2018-01-03 MED ORDER — PANTOPRAZOLE SODIUM 40 MG PO TBEC
40.0000 mg | DELAYED_RELEASE_TABLET | Freq: Every day | ORAL | Status: DC
  Administered 2018-01-03 – 2018-01-05 (×3): 40 mg via ORAL
  Filled 2018-01-03 (×3): qty 1

## 2018-01-03 MED ORDER — DIVALPROEX SODIUM ER 500 MG PO TB24
1000.0000 mg | ORAL_TABLET | Freq: Every day | ORAL | Status: DC
  Administered 2018-01-03: 1000 mg via ORAL
  Filled 2018-01-03: qty 2

## 2018-01-03 MED ORDER — TRAZODONE HCL 50 MG PO TABS
100.0000 mg | ORAL_TABLET | Freq: Every day | ORAL | Status: DC
  Administered 2018-01-03 – 2018-01-04 (×2): 100 mg via ORAL
  Filled 2018-01-03 (×2): qty 2

## 2018-01-03 MED ORDER — RISPERIDONE 1 MG PO TABS
4.0000 mg | ORAL_TABLET | Freq: Every day | ORAL | Status: DC
  Administered 2018-01-03 – 2018-01-04 (×2): 4 mg via ORAL
  Filled 2018-01-03 (×2): qty 4

## 2018-01-03 MED ORDER — GABAPENTIN 100 MG PO CAPS
100.0000 mg | ORAL_CAPSULE | Freq: Three times a day (TID) | ORAL | Status: DC
  Administered 2018-01-03 – 2018-01-05 (×6): 100 mg via ORAL
  Filled 2018-01-03 (×6): qty 1

## 2018-01-03 MED ORDER — ATORVASTATIN CALCIUM 20 MG PO TABS
20.0000 mg | ORAL_TABLET | Freq: Every day | ORAL | Status: DC
  Administered 2018-01-03 – 2018-01-05 (×3): 20 mg via ORAL
  Filled 2018-01-03 (×3): qty 1

## 2018-01-03 MED ORDER — BENZTROPINE MESYLATE 1 MG PO TABS
1.0000 mg | ORAL_TABLET | Freq: Two times a day (BID) | ORAL | Status: DC
  Administered 2018-01-03 – 2018-01-05 (×4): 1 mg via ORAL
  Filled 2018-01-03 (×4): qty 1

## 2018-01-03 MED ORDER — ENOXAPARIN SODIUM 40 MG/0.4ML ~~LOC~~ SOLN
40.0000 mg | SUBCUTANEOUS | Status: DC
  Administered 2018-01-03 – 2018-01-04 (×2): 40 mg via SUBCUTANEOUS
  Filled 2018-01-03 (×2): qty 0.4

## 2018-01-03 MED ORDER — METHIMAZOLE 5 MG PO TABS
10.0000 mg | ORAL_TABLET | ORAL | Status: DC
  Administered 2018-01-05: 10 mg via ORAL
  Filled 2018-01-03: qty 2

## 2018-01-03 NOTE — Progress Notes (Signed)
PROGRESS NOTE    Cassidy Smith  INO:[id number] DOB: 1955/05/23 DOA: 01/01/2018 PCP: Neale Burly, MD    Brief Narrative:  62 y/o female with history of bipolar disorder was admitted to the hospital after syncopal event.  Family reports that she has been falling more frequently.  She was found to have a urinary tract infection.  She was also mildly dehydrated.  She is on multiple medications for bipolar disorder which are currently being adjusted.  She was found to have a valproic acid level that was above therapeutic value.  She was previously on Risperdal which was held several months ago.  Her medications are currently being restarted.  If mental status remains stable, anticipate discharge home tomorrow with close follow-up with her neurologist/psychiatrist.   Assessment & Plan:   Principal Problem:   Acute metabolic encephalopathy Active Problems:   Hx of anxiety disorder   Bipolar 1 disorder ([place])   Lower urinary tract infectious disease   Hyperthyroidism   GERD (gastroesophageal reflux disease)   Valproic acid toxicity   Neuromuscular disorder ([place])   1. Urinary tract infection.  Urine culture positive for E. coli.  Currently on ceftriaxone.  Follow-up sensitivities. 2. Acute metabolic encephalopathy.  Likely multifactorial.  Patient has known underlying bipolar disorder.  Her urinary tract infection may be causing worsening mental status.  Depakote level also noted to be elevated.  Overall her mental status has improved.  She is more awake and alert at this time. 3. Bipolar 1 disorder.  She is chronically on Depakote.  She was previously on Risperdal 4 mg nightly, but this was discontinued several months ago.  Family reports that she was recently restarted on Risperdal at 2 mg, but she has continued to have agitation and mania.  We will increase her Risperdal dose back to her previous dosing of 4 mg nightly.  Her Depakote was held on admission and her level has likely trended  down.  Will repeat level.  Restart Depakote at lower dose.  She was previously taking 1500 mg nightly, will reduce to 1000 mg nightly.  She will need to follow-up with her psychiatrist/neurologist. 4. Hypothyroidism.  Continue on Tapazole.   DVT prophylaxis: Lovenox Code Status: Full code Family Communication: Discussed with cousin at the bedside Disposition Plan: Discharge home once improved   Consultants:     Procedures:     Antimicrobials:   Ceftriaxone 12/20 >   Subjective: Patient remains confused, she has not had any lethargy.  She is pleasant.  Denies any pain.  No shortness of breath.  Objective: Vitals:   01/02/18 2210 01/03/18 0610 01/03/18 0859 01/03/18 1433  BP: 124/78 (!) 164/87  (!) 152/70  Pulse: 70 71  72  Resp: 18 20  17   Temp: 98.2 F (36.8 C) 97.8 F (36.6 C)  98 F (36.7 C)  TempSrc: Oral Oral  Oral  SpO2: 100% 97% 98% 99%  Weight:      Height:        Intake/Output Summary (Last 24 hours) at 01/03/2018 1917 Last data filed at 01/03/2018 1500 Gross per 24 hour  Intake 2160.66 ml  Output -  Net 2160.66 ml   Filed Weights   01/02/18 0151  Weight: 48.4 kg    Examination:  General exam: Appears calm and comfortable  Respiratory system: Clear to auscultation. Respiratory effort normal. Cardiovascular system: S1 & S2 heard, RRR. No JVD, murmurs, rubs, gallops or clicks. No pedal edema. Gastrointestinal system: Abdomen is nondistended, soft and nontender.  No organomegaly or masses felt. Normal bowel sounds heard. Central nervous system:  No focal neurological deficits. Extremities: Symmetric 5 x 5 power. Skin: No rashes, lesions or ulcers Psychiatry: Confused, pleasant    Data Reviewed: I have personally reviewed following labs and imaging studies  CBC: Recent Labs  Lab 01/01/18 2041 01/02/18 0459 01/03/18 0623  WBC 12.8* 12.0* 7.0  NEUTROABS 8.8*  --   --   HGB 11.7* 10.8* 11.5*  HCT 36.7 34.0* 36.4  MCV 95.1 92.9 95.0  PLT  112* 101* 355*   Basic Metabolic Panel: Recent Labs  Lab 01/01/18 2041 01/02/18 0459 01/03/18 0623  NA 132* 134* 134*  K 3.8 4.0 3.7  CL 96* 99 99  CO2 28 28 28   GLUCOSE 150* 81 76  BUN 22 22 18   CREATININE 0.65 0.45 0.52  CALCIUM 9.4 8.9 9.1   GFR: Estimated Creatinine Clearance: 54.3 mL/min (by C-G formula based on SCr of 0.52 mg/dL). Liver Function Tests: Recent Labs  Lab 01/01/18 2041  AST 15  ALT 10  ALKPHOS 105  BILITOT 0.5  PROT 7.0  ALBUMIN 3.2*   No results for input(s): LIPASE, AMYLASE in the last 168 hours. No results for input(s): AMMONIA in the last 168 hours. Coagulation Profile: No results for input(s): INR, PROTIME in the last 168 hours. Cardiac Enzymes: No results for input(s): CKTOTAL, CKMB, CKMBINDEX, TROPONINI in the last 168 hours. BNP (last 3 results) No results for input(s): PROBNP in the last 8760 hours. HbA1C: No results for input(s): HGBA1C in the last 72 hours. CBG: No results for input(s): GLUCAP in the last 168 hours. Lipid Profile: No results for input(s): CHOL, HDL, LDLCALC, TRIG, CHOLHDL, LDLDIRECT in the last 72 hours. Thyroid Function Tests: No results for input(s): TSH, T4TOTAL, FREET4, T3FREE, THYROIDAB in the last 72 hours. Anemia Panel: No results for input(s): VITAMINB12, FOLATE, FERRITIN, TIBC, IRON, RETICCTPCT in the last 72 hours. Sepsis Labs: No results for input(s): PROCALCITON, LATICACIDVEN in the last 168 hours.  Recent Results (from the past 240 hour(s))  Urine culture     Status: Abnormal (Preliminary result)   Collection Time: 01/01/18  8:45 PM  Result Value Ref Range Status   Specimen Description   Final    URINE, CATHETERIZED Performed at [facility], [address], [place], [place] [address]    Special Requests   Final    NONE Performed at [facility], [address], [place], [place] [address]    Culture (A)  Final    >=100,000 COLONIES/mL ESCHERICHIA COLI CULTURE REINCUBATED FOR BETTER  GROWTH Performed at [facility], [place] [address], [place], [place] [address]    Report Status PENDING  Incomplete         Radiology Studies: Ct Head Wo Contrast  Result Date: 01/01/2018 CLINICAL DATA:  Altered LOC EXAM: CT HEAD WITHOUT CONTRAST TECHNIQUE: Contiguous axial images were obtained from the base of the skull through the vertex without intravenous contrast. COMPARISON:  MRI 08/15/2017, CT brain 08/14/2016 FINDINGS: Brain: No acute territorial infarction, hemorrhage, or intracranial mass. Atrophy and small vessel ischemic changes of the white matter. Chronic infarct in the right cerebellum. Stable prominent ventricle size. Vascular: No hyperdense vessels.  Carotid vascular calcification Skull: Normal. Negative for fracture or focal lesion. Sinuses/Orbits: Mild mucosal thickening in the ethmoid sinuses. Other: None IMPRESSION: 1. No CT evidence for acute intracranial abnormality. 2. Atrophy and mild small vessel ischemic changes of the white matter. Chronic right cerebellar infarct. Electronically Signed  By: Donavan Foil M.D.   On: 01/01/2018 22:45        Scheduled Meds: . atorvastatin  20 mg Oral q1800  . benztropine  1 mg Oral BID  . divalproex  1,000 mg Oral QHS  . enoxaparin (LOVENOX) injection  40 mg Subcutaneous Q24H  . gabapentin  100 mg Oral TID  . methimazole  10 mg Oral QODAY  . pantoprazole  40 mg Oral Daily  . risperiDONE  4 mg Oral QHS  . sodium chloride flush  3 mL Intravenous Q12H  . traZODone  100 mg Oral QHS   Continuous Infusions: . sodium chloride    . sodium chloride 100 mL/hr at 01/03/18 1125  . cefTRIAXone (ROCEPHIN)  IV Stopped (01/03/18 0010)     LOS: 0 days    Time spent:29mins    Kathie Dike, MD [facility] Pager [phone]  If 7PM-7AM, please contact night-coverage [email or web address] Password [facility] 01/03/2018, 7:17 PM

## 2018-01-04 DIAGNOSIS — E0590 Thyrotoxicosis, unspecified without thyrotoxic crisis or storm: Secondary | ICD-10-CM | POA: Diagnosis present

## 2018-01-04 DIAGNOSIS — Z8673 Personal history of transient ischemic attack (TIA), and cerebral infarction without residual deficits: Secondary | ICD-10-CM | POA: Diagnosis not present

## 2018-01-04 DIAGNOSIS — Z79899 Other long term (current) drug therapy: Secondary | ICD-10-CM | POA: Diagnosis not present

## 2018-01-04 DIAGNOSIS — F319 Bipolar disorder, unspecified: Secondary | ICD-10-CM | POA: Diagnosis present

## 2018-01-04 DIAGNOSIS — Z7982 Long term (current) use of aspirin: Secondary | ICD-10-CM | POA: Diagnosis not present

## 2018-01-04 DIAGNOSIS — N390 Urinary tract infection, site not specified: Secondary | ICD-10-CM | POA: Diagnosis present

## 2018-01-04 DIAGNOSIS — E860 Dehydration: Secondary | ICD-10-CM | POA: Diagnosis present

## 2018-01-04 DIAGNOSIS — Z1612 Extended spectrum beta lactamase (ESBL) resistance: Secondary | ICD-10-CM | POA: Diagnosis present

## 2018-01-04 DIAGNOSIS — A498 Other bacterial infections of unspecified site: Secondary | ICD-10-CM

## 2018-01-04 DIAGNOSIS — G709 Myoneural disorder, unspecified: Secondary | ICD-10-CM | POA: Diagnosis present

## 2018-01-04 DIAGNOSIS — T426X5A Adverse effect of other antiepileptic and sedative-hypnotic drugs, initial encounter: Secondary | ICD-10-CM | POA: Diagnosis present

## 2018-01-04 DIAGNOSIS — K219 Gastro-esophageal reflux disease without esophagitis: Secondary | ICD-10-CM | POA: Diagnosis present

## 2018-01-04 DIAGNOSIS — B9620 Unspecified Escherichia coli [E. coli] as the cause of diseases classified elsewhere: Secondary | ICD-10-CM | POA: Diagnosis present

## 2018-01-04 DIAGNOSIS — F419 Anxiety disorder, unspecified: Secondary | ICD-10-CM | POA: Diagnosis present

## 2018-01-04 DIAGNOSIS — N189 Chronic kidney disease, unspecified: Secondary | ICD-10-CM | POA: Diagnosis present

## 2018-01-04 DIAGNOSIS — R296 Repeated falls: Secondary | ICD-10-CM | POA: Diagnosis present

## 2018-01-04 DIAGNOSIS — G9341 Metabolic encephalopathy: Secondary | ICD-10-CM | POA: Diagnosis present

## 2018-01-04 DIAGNOSIS — R32 Unspecified urinary incontinence: Secondary | ICD-10-CM | POA: Diagnosis present

## 2018-01-04 DIAGNOSIS — Z87891 Personal history of nicotine dependence: Secondary | ICD-10-CM | POA: Diagnosis not present

## 2018-01-04 LAB — URINE CULTURE: Culture: >=100000 — AB

## 2018-01-04 MED ORDER — DIVALPROEX SODIUM ER 500 MG PO TB24
1250.0000 mg | ORAL_TABLET | Freq: Every day | ORAL | Status: DC
  Administered 2018-01-04: 1250 mg via ORAL
  Filled 2018-01-04 (×3): qty 1

## 2018-01-04 MED ORDER — SODIUM CHLORIDE 0.9 % IV SOLN
1.0000 g | Freq: Three times a day (TID) | INTRAVENOUS | Status: DC
  Administered 2018-01-04 – 2018-01-05 (×3): 1 g via INTRAVENOUS
  Filled 2018-01-04 (×7): qty 1

## 2018-01-04 MED ORDER — DIVALPROEX SODIUM ER 250 MG PO TB24
ORAL_TABLET | ORAL | Status: AC
  Filled 2018-01-04: qty 5

## 2018-01-04 NOTE — Progress Notes (Signed)
PROGRESS NOTE    Cassidy Smith  OMV:[id number] DOB: 1955/05/14 DOA: 01/01/2018 PCP: Neale Burly, MD    Brief Narrative:  62 y/o female with history of bipolar disorder was admitted to the hospital after syncopal event.  Family reports that she has been falling more frequently.  She was found to have a urinary tract infection.  She was also mildly dehydrated.  She is on multiple medications for bipolar disorder which are currently being adjusted.  She was found to have a valproic acid level that was above therapeutic value.  She was previously on Risperdal which was held several months ago.  Her medications are currently being restarted.  If mental status remains stable, anticipate discharge home tomorrow with close follow-up with her neurologist/psychiatrist.   Assessment & Plan:   Principal Problem:   Acute metabolic encephalopathy Active Problems:   Hx of anxiety disorder   Bipolar 1 disorder ([place])   Lower urinary tract infectious disease   Hyperthyroidism   GERD (gastroesophageal reflux disease)   Valproic acid toxicity   Neuromuscular disorder ([place])   1. ESBL Urinary tract infection. Resistant to multiple treatments.  DC ceftriaxone as it is resistant.  Start Zosyn IV.   2. Acute metabolic encephalopathy.  Likely multifactorial.  Patient has known underlying bipolar disorder.  Her urinary tract infection may be causing worsening mental status.  Depakote level also noted to be elevated.  Overall her mental status has improved.  She is more awake and alert at this time. 3. Bipolar 1 disorder.  She is chronically on Depakote.  She was previously on Risperdal 4 mg nightly, but this was discontinued several months ago.  Family reports that she was recently restarted on Risperdal at 2 mg, but she has continued to have agitation and mania.  We will increase her Risperdal dose back to her previous dosing of 4 mg nightly.  Her Depakote was held on admission and her level has likely  trended down.  Will repeat level.  Restart Depakote at lower dose.  She was previously taking 1500 mg nightly, will reduce to 1250 mg nightly.  She will need to follow-up with her psychiatrist/neurologist. 4. Hypothyroidism.  Continue on Tapazole.   DVT prophylaxis: Lovenox Code Status: Full code Family Communication: Discussed with cousin at the bedside Disposition Plan: Discharge home once improved  Consultants:     Procedures:     Antimicrobials:   Ceftriaxone 12/20 >   Subjective: Patient remains confused. Denies specific complaints.   No shortness of breath.  Objective: Vitals:   01/03/18 0859 01/03/18 1433 01/03/18 2137 01/04/18 0542  BP:  (!) 152/70 (!) 181/77 (!) 157/99  Pulse:  72 74 74  Resp:  17 16 17   Temp:  98 F (36.7 C) 97.6 F (36.4 C) 98.5 F (36.9 C)  TempSrc:  Oral Oral Oral  SpO2: 98% 99% 99% 99%  Weight:      Height:        Intake/Output Summary (Last 24 hours) at 01/04/2018 1019 Last data filed at 01/04/2018 0530 Gross per 24 hour  Intake 1304.55 ml  Output -  Net 1304.55 ml   Filed Weights   01/02/18 0151  Weight: 48.4 kg    Examination:  General exam: Appears calm and comfortable  Respiratory system: Clear to auscultation. Respiratory effort normal. Cardiovascular system: S1 & S2 heard, RRR. No JVD, murmurs, rubs, gallops or clicks. No pedal edema. Gastrointestinal system: Abdomen is nondistended, soft and nontender. No organomegaly or masses felt. Normal  bowel sounds heard. Central nervous system:  No focal neurological deficits. Extremities: Symmetric 5 x 5 power. Skin: No rashes, lesions or ulcers Psychiatry: Confused, pleasant  Data Reviewed: I have personally reviewed following labs and imaging studies  CBC: Recent Labs  Lab 01/01/18 2041 01/02/18 0459 01/03/18 0623  WBC 12.8* 12.0* 7.0  NEUTROABS 8.8*  --   --   HGB 11.7* 10.8* 11.5*  HCT 36.7 34.0* 36.4  MCV 95.1 92.9 95.0  PLT 112* 101* 108*   Basic  Metabolic Panel: Recent Labs  Lab 01/01/18 2041 01/02/18 0459 01/03/18 0623  NA 132* 134* 134*  K 3.8 4.0 3.7  CL 96* 99 99  CO2 28 28 28   GLUCOSE 150* 81 76  BUN 22 22 18   CREATININE 0.65 0.45 0.52  CALCIUM 9.4 8.9 9.1   GFR: Estimated Creatinine Clearance: 54.3 mL/min (by C-G formula based on SCr of 0.52 mg/dL). Liver Function Tests: Recent Labs  Lab 01/01/18 2041  AST 15  ALT 10  ALKPHOS 105  BILITOT 0.5  PROT 7.0  ALBUMIN 3.2*   No results for input(s): LIPASE, AMYLASE in the last 168 hours. No results for input(s): AMMONIA in the last 168 hours. Coagulation Profile: No results for input(s): INR, PROTIME in the last 168 hours. Cardiac Enzymes: No results for input(s): CKTOTAL, CKMB, CKMBINDEX, TROPONINI in the last 168 hours. BNP (last 3 results) No results for input(s): PROBNP in the last 8760 hours. HbA1C: No results for input(s): HGBA1C in the last 72 hours. CBG: No results for input(s): GLUCAP in the last 168 hours. Lipid Profile: No results for input(s): CHOL, HDL, LDLCALC, TRIG, CHOLHDL, LDLDIRECT in the last 72 hours. Thyroid Function Tests: No results for input(s): TSH, T4TOTAL, FREET4, T3FREE, THYROIDAB in the last 72 hours. Anemia Panel: No results for input(s): VITAMINB12, FOLATE, FERRITIN, TIBC, IRON, RETICCTPCT in the last 72 hours. Sepsis Labs: No results for input(s): PROCALCITON, LATICACIDVEN in the last 168 hours.  Recent Results (from the past 240 hour(s))  Urine culture     Status: Abnormal   Collection Time: 01/01/18  8:45 PM  Result Value Ref Range Status   Specimen Description   Final    URINE, CATHETERIZED Performed at [facility], [address] [place], [place] [address]    Special Requests   Final    NONE Performed at [facility], [address] [place], [place] [address]    Culture (A)  Final    >=100,000 COLONIES/mL ESCHERICHIA COLI Confirmed Extended Spectrum Beta-Lactamase Producer (ESBL).  In bloodstream infections  from ESBL organisms, carbapenems are preferred over piperacillin/tazobactam. They are shown to have a lower risk of mortality.    Report Status 01/04/2018 FINAL  Final   Organism ID, Bacteria ESCHERICHIA COLI (A)  Final      Susceptibility   Escherichia coli - MIC*    AMPICILLIN >=32 RESISTANT Resistant     CEFAZOLIN >=64 RESISTANT Resistant     CEFTRIAXONE >=64 RESISTANT Resistant     CIPROFLOXACIN >=4 RESISTANT Resistant     GENTAMICIN <=1 SENSITIVE Sensitive     IMIPENEM <=0.25 SENSITIVE Sensitive     NITROFURANTOIN <=16 SENSITIVE Sensitive     TRIMETH/SULFA >=320 RESISTANT Resistant     AMPICILLIN/SULBACTAM >=32 RESISTANT Resistant     PIP/TAZO 8 SENSITIVE Sensitive     Extended ESBL POSITIVE Resistant     * >=100,000 COLONIES/mL ESCHERICHIA COLI   Radiology Studies: No results found. Scheduled Meds: . atorvastatin  20 mg Oral q1800  .  benztropine  1 mg Oral BID  . divalproex  1,250 mg Oral QHS  . enoxaparin (LOVENOX) injection  40 mg Subcutaneous Q24H  . gabapentin  100 mg Oral TID  . methimazole  10 mg Oral QODAY  . pantoprazole  40 mg Oral Daily  . risperiDONE  4 mg Oral QHS  . sodium chloride flush  3 mL Intravenous Q12H  . traZODone  100 mg Oral QHS   Continuous Infusions: . sodium chloride    . sodium chloride 100 mL/hr at 01/03/18 2124     LOS: 0 days    Time spent:31 mins  Irwin Brakeman, MD [facility]   If 7PM-7AM, please contact night-coverage [email or web address] Password [facility] 01/04/2018, 10:19 AM

## 2018-01-04 NOTE — Plan of Care (Signed)
  Problem: Acute Rehab PT Goals(only PT should resolve) Goal: Patient Will Transfer Sit To/From Stand Outcome: Progressing Flowsheets (Taken 01/04/2018 1400) Patient will transfer sit to/from stand: Independently Goal: Pt Will Transfer Bed To Chair/Chair To Bed Outcome: Progressing Flowsheets (Taken 01/04/2018 1400) Pt will Transfer Bed to Chair/Chair to Bed: with modified independence Note:  With SPC or LRAD Goal: Pt Will Ambulate Outcome: Progressing Flowsheets (Taken 01/04/2018 1400) Pt will Ambulate: > 125 feet; with modified independence; with least restrictive assistive device Goal: Pt Will Go Up/Down Stairs Outcome: Progressing Flowsheets (Taken 01/04/2018 1400) Pt will Go Up / Down Stairs: Flight; with supervision; with rail(s)

## 2018-01-04 NOTE — Evaluation (Signed)
Physical Therapy Evaluation Patient Details Name: Cassidy Smith MRN: [id number] DOB: 06/08/1955 Today's Date: 01/04/2018   History of Present Illness  Cassidy Smith is a 62 y.o. female with medical history significant of bipolar disorder, TIA who has had several medical issues including bowel surgery in last 6 months with interruption in many of her psych medications including Depakote and Risperdal.  Family reports that she had a previous episode where she was somnolent and her Depakote levels were higher than what they were supposed to.  Her Depakote Risperdal were stopped and she has had worsening of her bipolar disorder with hallucinating more increasing paranoia and inability to sleep at night.  Her Depakote was started again by Dr. Merlene Laughter in the last couple of weeks.  Family is also called her psychiatrist who deferred her psych meds to Dr. Merlene Laughter.  She has had hardly any sleep going on a week now.  Today they were sitting at the dinner table when she all of a sudden slumped over and became unresponsive but her eyes were open.  There was no seizure-like activity.  She has chronic incontinence of her urine.  She is not had any nausea vomiting or diarrhea.  She is not had any fevers.  At her baseline she walks with a walker and/or a cane.  At her baseline she does have some paranoia but does not usually hallucinate.  At her baseline she sleeps well through the night when she was previously on the Risperdal and Depakote.  Patient is being referred for admission for altered mental status.   Clinical Impression   [name] is a 62 y.o. presenting for PT evaluation with recent decrease in functional mobility secondary to above admission for altered mental status. She is currently functioning below her baseline of modified independent with SPC for household mobility, and now requires min assist for transfers/gait with use of RW. Patient remains confused and has decreased awareness of  situation/place/time and poor recall of PLOF. Patient's cousin provided subjective history of PLOF and assitance available for patient. She is currently unsafe to discharge home due to impaired balance, significant fall history, poor safety awareness, and impaired problem solving. She currently requires cues to navigate environment with gait, and to complete ADL's such as toileting which she was independent with prior to admission. She will benefit from skilled PT to address current impairments and from follow up PT at below venue to address mobility and improve safety with use of SPC to be able to return to home with reduced fall risk. Acute PT will follow.     Follow Up Recommendations SNF    Equipment Recommendations  None recommended by PT    Recommendations for Other Services       Precautions / Restrictions Precautions Precautions: Fall Restrictions Weight Bearing Restrictions: No      Mobility  Bed Mobility Overal bed mobility: Independent     Transfers Overall transfer level: Needs assistance Equipment used: Rolling walker (2 wheeled) Transfers: Sit to/from [employer] Transfers Sit to Stand: Supervision Stand pivot transfers: Supervision     Ambulation/Gait Ambulation/Gait assistance: Min assist Gait Distance (Feet): 75 Feet Assistive device: Rolling walker (2 wheeled) Gait Pattern/deviations: Step-to pattern;Decreased stride length;Shuffle;Drifts right/left;Narrow base of support   Gait velocity interpretation: <1.31 ft/sec, indicative of household ambulator General Gait Details: slow gait, unsteady, patient "bobbing" with each step and requiring UE support and verbal/tactile cues to maintain safe proximety to RW. Patient requires simple cues to complete turns  Balance Overall balance assessment: Needs assistance   Sitting balance-Leahy Scale: Good       Standing balance-Leahy Scale: Fair Standing balance comment: patient unable to maintain  balance with dynamic challenge of marching in place and requires bil UE support for safety with gait        Pertinent Vitals/Pain Pain Assessment: No/denies pain    Home Living Family/patient expects to be discharged to:: Private residence Living Arrangements: Other relatives(brother lives with patient) Available Help at Discharge: Personal care attendant;Family(patient has nurse aid M-F form 8am-1pm. Lives with brother, and cousin is available to help) Type of Home: House Home Access: Stairs to enter;Level entry Entrance Stairs-Rails: None Entrance Stairs-Number of Steps: no steps into house, 2 steps into dining room, patient sleeps on second floor and must go up a full flight of stairs Home Layout: Two level [place] - single point;Shower seat - built in;Walker - 2 wheels;Grab bars - toilet(cannot use RW in home) Additional Comments: Patient lives with brother and is modified independent with bathing and dressing most of the time and has some assitance occasionally from home aid    Prior Function Level of Independence: Needs assistance   Gait / Transfers Assistance Needed: uses SPC for home mobility  ADL's / Homemaking Assistance Needed: home aids Mon-Fri, form 8am-1pm per patien'ts cousin         Hand Dominance   Dominant Hand: Right    Extremity/Trunk Assessment   Upper Extremity Assessment Upper Extremity Assessment: Overall WFL for tasks assessed    Lower Extremity Assessment Lower Extremity Assessment: Generalized weakness    Cervical / Trunk Assessment Cervical / Trunk Assessment: Normal  Communication   Communication: No difficulties  Cognition Arousal/Alertness: Awake/alert Behavior During Therapy: WFL for tasks assessed/performed Overall Cognitive Status: Impaired/Different from baseline Area of Impairment: Orientation;Attention;Following commands;Safety/judgement;Problem solving    Orientation Level: Disoriented to;Time;Situation;Place      Following Commands: Follows one step commands with increased time;Follows multi-step commands inconsistently Safety/Judgement: Decreased awareness of safety   Problem Solving: Requires verbal cues General Comments: patient is alert and oriented to self but not to place, time, or situation. She requires extra time for processing and cues for safety with use of RW and verbal/tactile cues for donning/doffing gown and briefs. Patient demonstrates poor awarenss to environment/surroundings with inability to recognize toilet when going into bathroom.             Assessment/Plan    PT Assessment Patient needs continued PT services  PT Problem List Decreased coordination;Decreased strength;Decreased cognition;Decreased activity tolerance;Decreased balance;Decreased mobility;Decreased safety awareness       PT Treatment Interventions DME instruction;Therapeutic exercise;Gait training;Wheelchair mobility training;Balance training;Stair training;Functional mobility training;Therapeutic activities;Patient/family education    PT Goals (Current goals can be found in the Care Plan section)  Acute Rehab PT Goals Patient Stated Goal: to improve safety awareness and strength to return home to mod I level PT Goal Formulation: With patient/family Time For Goal Achievement: 01/09/18 Potential to Achieve Goals: Good    Frequency Min 3X/week   Barriers to discharge Inaccessible home environment patient is unable to mobilize with RW in home and requires [facility], she must be able to ascend/descend stairs safely as she lives on the second floor       AM-PAC PT "6 Clicks" Mobility  Outcome Measure Help needed turning from your back to your side while in a flat bed without using bedrails?: None Help needed moving from lying on your back to sitting on the side of a  flat bed without using bedrails?: None Help needed moving to and from a bed to a chair (including a wheelchair)?: A Little Help needed standing up  from a chair using your arms (e.g., wheelchair or bedside chair)?: A Little Help needed to walk in hospital room?: A Little Help needed climbing 3-5 steps with a railing? : Total 6 Click Score: 18    End of Session Equipment Utilized During Treatment: Gait belt(RW) Activity Tolerance: Patient tolerated treatment well Patient left: in chair;with chair alarm set;with call bell/phone within reach;with family/visitor present Nurse Communication: Mobility status;Other (comment)(notified RN, Thornell Mule, pt is in chair with alarm, and requires bed change) PT Visit Diagnosis: Unsteadiness on feet (R26.81);Difficulty in walking, not elsewhere classified (R26.2);Repeated falls (R29.6);History of falling (Z91.81);Other abnormalities of gait and mobility (R26.89)    Time: 3419-3790 PT Time Calculation (min) (ACUTE ONLY): 53 min   Charges:   PT Evaluation $PT Eval Moderate Complexity: 1 Mod PT Treatments $Therapeutic Activity: 23-37 mins       Kipp Brood, PT, DPT Physical Therapist with [facility]  01/04/2018 1:53 PM

## 2018-01-04 NOTE — Progress Notes (Signed)
Pharmacy Antibiotic Note  Cassidy Smith is a 62 y.o. female admitted on 01/01/2018 with E.Coli ESBL UTI.  Pharmacy has been consulted for Merrem dosing.  Plan: Merrem 1gm IV q8h F/U clinical progress Monitor V/S and labs  Height: 5\' 5"  (165.1 cm) Weight: 106 lb 11.2 oz (48.4 kg) IBW/kg (Calculated) : 57  Temp (24hrs), Avg:98 F (36.7 C), Min:97.6 F (36.4 C), Max:98.5 F (36.9 C)  Recent Labs  Lab 01/01/18 2041 01/02/18 0459 01/03/18 0623  WBC 12.8* 12.0* 7.0  CREATININE 0.65 0.45 0.52    Estimated Creatinine Clearance: 54.3 mL/min (by C-G formula based on SCr of 0.52 mg/dL).    No Known Allergies  Antimicrobials this admission: Merrem 12/22 >>  Ceftriaxone 12/20 >> 12/22   Dose adjustments this admission: n/a  Microbiology results: 12/19 UCx: >100,000K CFU/ml E.Coli ESBL s- imipenem, nitrofurantoin r-septra cipro, ceftriaxone  Thank you for allowing pharmacy to be a part of this patient's care.  Isac Sarna, BS Pharm D, [place] Clinical Pharmacist Pager [phone] 01/04/2018 10:46 AM

## 2018-01-05 ENCOUNTER — Encounter (HOSPITAL_COMMUNITY): Payer: Self-pay | Admitting: Family Medicine

## 2018-01-05 MED ORDER — FOSFOMYCIN TROMETHAMINE 3 G PO PACK
3.0000 g | PACK | Freq: Once | ORAL | Status: AC
  Administered 2018-01-05: 3 g via ORAL
  Filled 2018-01-05: qty 3

## 2018-01-05 MED ORDER — RISPERIDONE 1 MG PO TABS: 1.0000 mg | ORAL_TABLET | Freq: Every evening | ORAL | 0 refills | Status: DC | PRN

## 2018-01-05 MED ORDER — BENZTROPINE MESYLATE 1 MG PO TABS: 1.0000 mg | ORAL_TABLET | Freq: Two times a day (BID) | ORAL | Status: AC

## 2018-01-05 MED ORDER — POTASSIUM CHLORIDE CRYS ER 10 MEQ PO TBCR: 10.0000 meq | EXTENDED_RELEASE_TABLET | ORAL | Status: DC

## 2018-01-05 MED ORDER — FOSFOMYCIN TROMETHAMINE 3 G PO PACK: 3.0000 g | PACK | Freq: Once | ORAL | 0 refills | Status: AC

## 2018-01-05 MED ORDER — FUROSEMIDE 20 MG PO TABS: 10.0000 mg | ORAL_TABLET | ORAL | Status: DC

## 2018-01-05 MED ORDER — SODIUM CHLORIDE 1 G PO TABS: 1.0000 g | ORAL_TABLET | Freq: Three times a day (TID) | ORAL | Status: DC

## 2018-01-05 NOTE — Plan of Care (Signed)

## 2018-01-05 NOTE — Discharge Instructions (Signed)
Please follow up with neurologist in 2 weeks.   Please follow up with primary care provider in 1 week.   Please take 1 dose of fosfomycin in 3 days as ordered.     Seek medical care or return to ER if symptoms come back, worsen or new problem develops.   Follow with Primary MD  Neale Burly, MD  and other consultants as instructed your Hospitalist MD  Please get a complete blood count and chemistry panel checked by your Primary MD at your next visit, and again as instructed by your Primary MD.  Get Medicines reviewed and adjusted: Please take all your medications with you for your next visit with your Primary MD  Laboratory/radiological data: Please request your Primary MD to go over all hospital tests and procedure/radiological results at the follow up, please ask your Primary MD to get all Hospital records sent to his/her office.  In some cases, they will be blood work, cultures and biopsy results pending at the time of your discharge. Please request that your primary care M.D. follows up on these results.  Also Note the following: If you experience worsening of your admission symptoms, develop shortness of breath, life threatening emergency, suicidal or homicidal thoughts you must seek medical attention immediately by calling 911 or calling your MD immediately  if symptoms less severe.  You must read complete instructions/literature along with all the possible adverse reactions/side effects for all the Medicines you take and that have been prescribed to you. Take any new Medicines after you have completely understood and accpet all the possible adverse reactions/side effects.   Do not drive when taking Pain medications or sleeping medications (Benzodaizepines)  Do not take more than prescribed Pain, Sleep and Anxiety Medications. It is not advisable to combine anxiety,sleep and pain medications without talking with your primary care practitioner  Special Instructions: If you have  smoked or chewed Tobacco  in the last 2 yrs please stop smoking, stop any regular Alcohol  and or any Recreational drug use.  Wear Seat belts while driving.  Please note: You were cared for by a hospitalist during your hospital stay. Once you are discharged, your primary care physician will handle any further medical issues. Please note that NO REFILLS for any discharge medications will be authorized once you are discharged, as it is imperative that you return to your primary care physician (or establish a relationship with a primary care physician if you do not have one) for your post hospital discharge needs so that they can reassess your need for medications and monitor your lab values.

## 2018-01-05 NOTE — Discharge Summary (Signed)
Physician Discharge Summary  Cassidy Smith PQZ:[id number] DOB: [birth year] DOA: 01/01/2018  PCP: Neale Burly, MD  Admit date: 01/01/2018 Discharge date: 01/05/2018  Admitted From: Home  Disposition: Home with [facility] services   Recommendations for Outpatient Follow-up:  1. Follow up with PCP in 1 weeks 2. Follow up with neurologist in 2 weeks.  3. Please recheck depakote level in 1-2 weeks.   Home Health: RN, PT  Discharge Condition: STABLE   CODE STATUS: FULL    Brief Hospitalization Summary: Please see all hospital notes, images, labs for full details of the hospitalization. HPI: Cassidy Smith is a 62 y.o. female with medical history significant of bipolar disorder, TIA who has had several medical issues including bowel surgery in last 6 months with interruption in many of her psych medications including Depakote and Risperdal.  Family reports that she had a previous episode where she was somnolent and her Depakote levels were higher than what they were supposed to.  Her Depakote Risperdal were stopped and she has had worsening of her bipolar disorder with hallucinating more increasing paranoia and inability to sleep at night.  Her Depakote was started again by Dr. Merlene Laughter in the last couple of weeks.  Family is also called her psychiatrist who deferred her psych meds to Dr. Merlene Laughter.  She has had hardly any sleep going on a week now.  Today they were sitting at the dinner table when she all of a sudden slumped over and became unresponsive but her eyes were open.  There was no seizure-like activity.  She has chronic incontinence of her urine.  She is not had any nausea vomiting or diarrhea.  She is not had any fevers.  At her baseline she walks with a walker and/or a cane.  At her baseline she does have some paranoia but does not usually hallucinate.  At her baseline she sleeps well through the night when she was previously on the Risperdal and Depakote.  Patient is being referred for  admission for altered mental status.  Brief Narrative:  62 y/o female with history of bipolar disorder was admitted to the hospital after syncopal event.  Family reports that she has been falling more frequently.  She was found to have a urinary tract infection.  She was also mildly dehydrated.  She is on multiple medications for bipolar disorder which are currently being adjusted.  She was found to have a valproic acid level that was above therapeutic value.  She was previously on Risperdal which was held several months ago.  Her medications are currently being restarted.  If mental status remains stable, anticipate discharge home tomorrow with close follow-up with her neurologist/psychiatrist.  Assessment & Plan:   Principal Problem:   Acute metabolic encephalopathy Active Problems:   Hx of anxiety disorder   Bipolar 1 disorder ([place])   Lower urinary tract infectious disease   Hyperthyroidism   GERD (gastroesophageal reflux disease)   Valproic acid toxicity   Neuromuscular disorder  1. ESBL Urinary tract infection. Resistant to multiple treatments.  DC ceftriaxone as it is resistant. Treated with meropenem and then fosfomycin.  Pt to repeat fosfomycin dose in 3 days.   2. Acute metabolic encephalopathy.  Resolved now.  Likely multifactorial.  Patient has known underlying bipolar disorder.  Her urinary tract infection may be causing worsening mental status.   Overall her mental status has improved.  She is more awake and alert at this time.  She wants to discharge home today.  Home  health services have been arranged.  3. Bipolar 1 disorder.  She is chronically on Depakote.  She was previously on Risperdal 4 mg nightly, but this was discontinued several months ago.   She will need to follow-up with her psychiatrist/neurologist.  Resuming her back on her regular home depakote dose.  Follow up with neurologist and PCP to recheck depakote level.   The infection has likely exacerbated her symptoms  but she is much improved now after treatment of the infection.  4. Hypothyroidism.  Continue on Tapazole.   DVT prophylaxis: Lovenox Code Status: Full code Family Communication:  cousin at the bedside Disposition Plan: Discharge home with [facility] services  Antimicrobials:   Ceftriaxone 12/20 >12/22  Meropenem 12/22>12/23  Fosfomycin 12/23, to be repeated on 12/26  Discharge Diagnoses:  Principal Problem:   Acute metabolic encephalopathy Active Problems:   Hx of anxiety disorder   Bipolar 1 disorder ([place])   Lower urinary tract infectious disease   Hyperthyroidism   GERD (gastroesophageal reflux disease)   Valproic acid toxicity   Neuromuscular disorder ([facility])   UTI (urinary tract infection)  Discharge Instructions: Discharge Instructions    Call MD for:  difficulty breathing, headache or visual disturbances   Complete by:  As directed    Call MD for:  extreme fatigue   Complete by:  As directed    Call MD for:  persistant dizziness or light-headedness   Complete by:  As directed    Call MD for:  persistant nausea and vomiting   Complete by:  As directed    Call MD for:  severe uncontrolled pain   Complete by:  As directed    Increase activity slowly   Complete by:  As directed      Allergies as of 01/05/2018   No Known Allergies     Medication List    STOP taking these medications   magnesium oxide 400 (241.3 Mg) MG tablet Commonly known as:  MAG-OX     TAKE these medications   aspirin 81 MG chewable tablet Chew by mouth daily.   atorvastatin 20 MG tablet Commonly known as:  LIPITOR Take 1 tablet (20 mg total) by mouth daily at 6 PM.   benztropine 1 MG tablet Commonly known as:  COGENTIN Take 1 tablet (1 mg total) by mouth 2 (two) times daily.   Calcium Carb-Cholecalciferol [phone] MG-UNIT Caps Take 1 capsule by mouth daily.   divalproex 500 MG 24 hr tablet Commonly known as:  DEPAKOTE ER Take 2 tablets (1,000 mg total) by mouth at  bedtime. What changed:  how much to take   fosfomycin 3 g Pack Commonly known as:  MONUROL Take 3 g by mouth once for 1 dose. Start taking on:  January 08, 2018   furosemide 20 MG tablet Commonly known as:  LASIX Take 0.5 tablets (10 mg total) by mouth every other day. Start taking on:  January 08, 2018 What changed:    how much to take  when to take this  These instructions start on January 08, 2018. If you are unsure what to do until then, ask your doctor or other care provider.   gabapentin 100 MG capsule Commonly known as:  NEURONTIN Take 1 capsule (100 mg total) by mouth 3 (three) times daily.   methimazole 10 MG tablet Commonly known as:  TAPAZOLE Take 10 mg by mouth every other day.   multivitamin with minerals Tabs tablet Take 1 tablet by mouth daily.   pantoprazole 40 MG  tablet Commonly known as:  PROTONIX Take 40 mg by mouth daily.   potassium chloride 10 MEQ tablet Commonly known as:  K-DUR,KLOR-CON Take 1 tablet (10 mEq total) by mouth every other day. Start taking on:  January 08, 2018 What changed:    when to take this  These instructions start on January 08, 2018. If you are unsure what to do until then, ask your doctor or other care provider.   risperiDONE 1 MG tablet Commonly known as:  RISPERDAL Take 1 tablet (1 mg total) by mouth at bedtime as needed for up to 15 days (agitation).   sodium chloride 1 g tablet Take 1 tablet (1 g total) by mouth 3 (three) times daily with meals.   traZODone 100 MG tablet Commonly known as:  DESYREL Take 100 mg by mouth at bedtime.   vitamin B-12 1000 MCG tablet Commonly known as:  CYANOCOBALAMIN Take 1 tablet (1,000 mcg total) by mouth daily.      Follow-up Information    Phillips Odor, MD. Schedule an appointment as soon as possible for a visit in 2 week(s).   Specialty:  Neurology Why:  Hospital Follow Up  Contact information: [address] [place] [address] [phone]         Neale Burly, MD. Schedule an appointment as soon as possible for a visit in 1 week(s).   Specialty:  Internal Medicine Why:  Hospital Follow Up  Contact information: [address] [place]  [id number] [phone]          No Known Allergies Allergies as of 01/05/2018   No Known Allergies     Medication List    STOP taking these medications   magnesium oxide 400 (241.3 Mg) MG tablet Commonly known as:  MAG-OX     TAKE these medications   aspirin 81 MG chewable tablet Chew by mouth daily.   atorvastatin 20 MG tablet Commonly known as:  LIPITOR Take 1 tablet (20 mg total) by mouth daily at 6 PM.   benztropine 1 MG tablet Commonly known as:  COGENTIN Take 1 tablet (1 mg total) by mouth 2 (two) times daily.   Calcium Carb-Cholecalciferol [phone] MG-UNIT Caps Take 1 capsule by mouth daily.   divalproex 500 MG 24 hr tablet Commonly known as:  DEPAKOTE ER Take 2 tablets (1,000 mg total) by mouth at bedtime. What changed:  how much to take   fosfomycin 3 g Pack Commonly known as:  MONUROL Take 3 g by mouth once for 1 dose. Start taking on:  January 08, 2018   furosemide 20 MG tablet Commonly known as:  LASIX Take 0.5 tablets (10 mg total) by mouth every other day. Start taking on:  January 08, 2018 What changed:    how much to take  when to take this  These instructions start on January 08, 2018. If you are unsure what to do until then, ask your doctor or other care provider.   gabapentin 100 MG capsule Commonly known as:  NEURONTIN Take 1 capsule (100 mg total) by mouth 3 (three) times daily.   methimazole 10 MG tablet Commonly known as:  TAPAZOLE Take 10 mg by mouth every other day.   multivitamin with minerals Tabs tablet Take 1 tablet by mouth daily.   pantoprazole 40 MG tablet Commonly known as:  PROTONIX Take 40 mg by mouth daily.   potassium chloride 10 MEQ tablet Commonly known as:  K-DUR,KLOR-CON Take 1 tablet (10 mEq  total)  by mouth every other day. Start taking on:  January 08, 2018 What changed:    when to take this  These instructions start on January 08, 2018. If you are unsure what to do until then, ask your doctor or other care provider.   risperiDONE 1 MG tablet Commonly known as:  RISPERDAL Take 1 tablet (1 mg total) by mouth at bedtime as needed for up to 15 days (agitation).   sodium chloride 1 g tablet Take 1 tablet (1 g total) by mouth 3 (three) times daily with meals.   traZODone 100 MG tablet Commonly known as:  DESYREL Take 100 mg by mouth at bedtime.   vitamin B-12 1000 MCG tablet Commonly known as:  CYANOCOBALAMIN Take 1 tablet (1,000 mcg total) by mouth daily.       Procedures/Studies: Ct Head Wo Contrast  Result Date: 01/01/2018 CLINICAL DATA:  Altered LOC EXAM: CT HEAD WITHOUT CONTRAST TECHNIQUE: Contiguous axial images were obtained from the base of the skull through the vertex without intravenous contrast. COMPARISON:  MRI 08/15/2017, CT brain 08/14/2016 FINDINGS: Brain: No acute territorial infarction, hemorrhage, or intracranial mass. Atrophy and small vessel ischemic changes of the white matter. Chronic infarct in the right cerebellum. Stable prominent ventricle size. Vascular: No hyperdense vessels.  Carotid vascular calcification Skull: Normal. Negative for fracture or focal lesion. Sinuses/Orbits: Mild mucosal thickening in the ethmoid sinuses. Other: None IMPRESSION: 1. No CT evidence for acute intracranial abnormality. 2. Atrophy and mild small vessel ischemic changes of the white matter. Chronic right cerebellar infarct. Electronically Signed   By: Donavan Foil M.D.   On: 01/01/2018 22:45     Subjective: Pt is without complaints, wanting to go  Home today.    Discharge Exam: Vitals:   01/04/18 2117 01/05/18 0638  BP: 119/80 (!) 159/77  Pulse: 65 75  Resp: 16 16  Temp: 98 F (36.7 C) 97.9 F (36.6 C)  SpO2: 91% 100%   Vitals:   01/04/18 0542  01/04/18 1442 01/04/18 2117 01/05/18 0638  BP: (!) 157/99 (!) 144/77 119/80 (!) 159/77  Pulse: 74 77 65 75  Resp: 17 18 16 16   Temp: 98.5 F (36.9 C) (!) 97.5 F (36.4 C) 98 F (36.7 C) 97.9 F (36.6 C)  TempSrc: Oral Oral Oral Oral  SpO2: 99% 100% 91% 100%  Weight:      Height:       General exam: Appears calm and comfortable  Respiratory system: Clear to auscultation. Respiratory effort normal. Cardiovascular system: S1 & S2 heard, RRR. No JVD, murmurs, rubs, gallops or clicks. No pedal edema. Gastrointestinal system: Abdomen is nondistended, soft and nontender. No organomegaly or masses felt. Normal bowel sounds heard. Central nervous system:  No focal neurological deficits. Extremities: Symmetric 5 x 5 power. Skin: No rashes, lesions or ulcers Psychiatry:  pleasant affect.     The results of significant diagnostics from this hospitalization (including imaging, microbiology, ancillary and laboratory) are listed below for reference.     Microbiology: Recent Results (from the past 240 hour(s))  Urine culture     Status: Abnormal   Collection Time: 01/01/18  8:45 PM  Result Value Ref Range Status   Specimen Description   Final    URINE, CATHETERIZED Performed at [facility], [address] [place], [place] [address]    Special Requests   Final    NONE Performed at [facility], [address] [place], [place] [address]    Culture (A)  Final    >=  100,000 COLONIES/mL ESCHERICHIA COLI Confirmed Extended Spectrum Beta-Lactamase Producer (ESBL).  In bloodstream infections from ESBL organisms, carbapenems are preferred over piperacillin/tazobactam. They are shown to have a lower risk of mortality.    Report Status 01/04/2018 FINAL  Final   Organism ID, Bacteria ESCHERICHIA COLI (A)  Final      Susceptibility   Escherichia coli - MIC*    AMPICILLIN >=32 RESISTANT Resistant     CEFAZOLIN >=64 RESISTANT Resistant     CEFTRIAXONE >=64 RESISTANT Resistant     CIPROFLOXACIN  >=4 RESISTANT Resistant     GENTAMICIN <=1 SENSITIVE Sensitive     IMIPENEM <=0.25 SENSITIVE Sensitive     NITROFURANTOIN <=16 SENSITIVE Sensitive     TRIMETH/SULFA >=320 RESISTANT Resistant     AMPICILLIN/SULBACTAM >=32 RESISTANT Resistant     PIP/TAZO 8 SENSITIVE Sensitive     Extended ESBL POSITIVE Resistant     * >=100,000 COLONIES/mL ESCHERICHIA COLI     Labs: BNP (last 3 results) No results for input(s): BNP in the last 8760 hours. Basic Metabolic Panel: Recent Labs  Lab 01/01/18 2041 01/02/18 0459 01/03/18 0623  NA 132* 134* 134*  K 3.8 4.0 3.7  CL 96* 99 99  CO2 28 28 28   GLUCOSE 150* 81 76  BUN 22 22 18   CREATININE 0.65 0.45 0.52  CALCIUM 9.4 8.9 9.1   Liver Function Tests: Recent Labs  Lab 01/01/18 2041  AST 15  ALT 10  ALKPHOS 105  BILITOT 0.5  PROT 7.0  ALBUMIN 3.2*   No results for input(s): LIPASE, AMYLASE in the last 168 hours. No results for input(s): AMMONIA in the last 168 hours. CBC: Recent Labs  Lab 01/01/18 2041 01/02/18 0459 01/03/18 0623  WBC 12.8* 12.0* 7.0  NEUTROABS 8.8*  --   --   HGB 11.7* 10.8* 11.5*  HCT 36.7 34.0* 36.4  MCV 95.1 92.9 95.0  PLT 112* 101* 108*   Cardiac Enzymes: No results for input(s): CKTOTAL, CKMB, CKMBINDEX, TROPONINI in the last 168 hours. BNP: Invalid input(s): POCBNP CBG: No results for input(s): GLUCAP in the last 168 hours. D-Dimer No results for input(s): DDIMER in the last 72 hours. Hgb A1c No results for input(s): HGBA1C in the last 72 hours. Lipid Profile No results for input(s): CHOL, HDL, LDLCALC, TRIG, CHOLHDL, LDLDIRECT in the last 72 hours. Thyroid function studies No results for input(s): TSH, T4TOTAL, T3FREE, THYROIDAB in the last 72 hours.  Invalid input(s): FREET3 Anemia work up No results for input(s): VITAMINB12, FOLATE, FERRITIN, TIBC, IRON, RETICCTPCT in the last 72 hours. Urinalysis    Component Value Date/Time   COLORURINE YELLOW 01/01/2018 2045   APPEARANCEUR CLEAR  01/01/2018 2045   LABSPEC 1.017 01/01/2018 2045   PHURINE 6.0 01/01/2018 2045   GLUCOSEU NEGATIVE 01/01/2018 2045   HGBUR NEGATIVE 01/01/2018 2045   BILIRUBINUR NEGATIVE 01/01/2018 2045   KETONESUR NEGATIVE 01/01/2018 2045   PROTEINUR NEGATIVE 01/01/2018 2045   UROBILINOGEN 0.2 05/12/2013 0210   NITRITE POSITIVE (A) 01/01/2018 2045   LEUKOCYTESUR SMALL (A) 01/01/2018 2045   Sepsis Labs Invalid input(s): PROCALCITONIN,  WBC,  LACTICIDVEN Microbiology Recent Results (from the past 240 hour(s))  Urine culture     Status: Abnormal   Collection Time: 01/01/18  8:45 PM  Result Value Ref Range Status   Specimen Description   Final    URINE, CATHETERIZED Performed at [facility], [address] [place], [place] [address]    Special Requests   Final    NONE Performed at  [address] [place], [place] [address]    Culture (A)  Final    >=100,000 COLONIES/mL ESCHERICHIA COLI Confirmed Extended Spectrum Beta-Lactamase Producer (ESBL).  In bloodstream infections from ESBL organisms, carbapenems are preferred over piperacillin/tazobactam. They are shown to have a lower risk of mortality.    Report Status 01/04/2018 FINAL  Final   Organism ID, Bacteria ESCHERICHIA COLI (A)  Final      Susceptibility   Escherichia coli - MIC*    AMPICILLIN >=32 RESISTANT Resistant     CEFAZOLIN >=64 RESISTANT Resistant     CEFTRIAXONE >=64 RESISTANT Resistant     CIPROFLOXACIN >=4 RESISTANT Resistant     GENTAMICIN <=1 SENSITIVE Sensitive     IMIPENEM <=0.25 SENSITIVE Sensitive     NITROFURANTOIN <=16 SENSITIVE Sensitive     TRIMETH/SULFA >=320 RESISTANT Resistant     AMPICILLIN/SULBACTAM >=32 RESISTANT Resistant     PIP/TAZO 8 SENSITIVE Sensitive     Extended ESBL POSITIVE Resistant     * >=100,000 COLONIES/mL ESCHERICHIA COLI     Time coordinating discharge: 34 minutes   SIGNED:  Irwin Brakeman, MD  [facility] 01/05/2018, 12:56 PM Pager [phone]  If 7PM-7AM,  please contact night-coverage [email or web address] Password [email or web address]

## 2018-01-05 NOTE — Clinical Social Work Note (Signed)
Patient will discharge home. CM confirmed plan with family. LCSW signing off.    Hays Dunnigan, Clydene Pugh, LCSW

## 2018-01-05 NOTE — Care Management Note (Signed)
Case Management Note  Patient Details  Name: Cassidy Smith MRN: [id number] Date of Birth: 1955-01-21  Subjective/Objective:                 Admitted with encephalopathy. From home, lives with brother. Per brother the plan is for pt to return home. They have asked for referral to [facility]. Aware that [facility] has 48 hrs to make first visit.    Action/Plan: DC home today with referral for [facility] services through [facility]. [facility] rep given referral.   Expected Discharge Date:     01/05/18             Expected Discharge Plan:  [place]  In-House Referral:  NA  Discharge planning Services  CM Consult  Post Acute Care Choice:  Home Health Choice offered to:  Sibling  HH Arranged:  RN, PT [place] Agency:  [facility] ([facility]  Status of Service:  Completed, signed off  Sherald Barge, RN 01/05/2018, 11:29 AM

## 2018-01-05 NOTE — Plan of Care (Signed)
  Problem: Education: Goal: Knowledge of General Education information will improve Description Including pain rating scale, medication(s)/side effects and non-pharmacologic comfort measures 01/05/2018 1606 by Rance Muir, RN Outcome: Adequate for Discharge 01/05/2018 1054 by Rance Muir, RN Outcome: Progressing   Problem: Health Behavior/Discharge Planning: Goal: Ability to manage health-related needs will improve 01/05/2018 1606 by Rance Muir, RN Outcome: Adequate for Discharge 01/05/2018 1054 by Rance Muir, RN Outcome: Progressing   Problem: Clinical Measurements: Goal: Ability to maintain clinical measurements within normal limits will improve 01/05/2018 1606 by Rance Muir, RN Outcome: Adequate for Discharge 01/05/2018 1054 by Rance Muir, RN Outcome: Progressing Goal: Will remain free from infection 01/05/2018 1606 by Rance Muir, RN Outcome: Adequate for Discharge 01/05/2018 1054 by Rance Muir, RN Outcome: Progressing Goal: Diagnostic test results will improve 01/05/2018 1606 by Rance Muir, RN Outcome: Adequate for Discharge 01/05/2018 1054 by Rance Muir, RN Outcome: Progressing Goal: Respiratory complications will improve 01/05/2018 1606 by Rance Muir, RN Outcome: Adequate for Discharge 01/05/2018 1054 by Rance Muir, RN Outcome: Progressing Goal: Cardiovascular complication will be avoided 01/05/2018 1606 by Rance Muir, RN Outcome: Adequate for Discharge 01/05/2018 1054 by Rance Muir, RN Outcome: Progressing   Problem: Activity: Goal: Risk for activity intolerance will decrease 01/05/2018 1606 by Rance Muir, RN Outcome: Adequate for Discharge 01/05/2018 1054 by Rance Muir, RN Outcome: Progressing   Problem: Nutrition: Goal: Adequate nutrition will be maintained 01/05/2018 1606 by Rance Muir, RN Outcome: Adequate for Discharge 01/05/2018 1054 by Rance Muir, RN Outcome: Progressing   Problem: Coping: Goal: Level of anxiety will decrease 01/05/2018 1606 by Rance Muir,  RN Outcome: Adequate for Discharge 01/05/2018 1054 by Rance Muir, RN Outcome: Progressing   Problem: Elimination: Goal: Will not experience complications related to bowel motility 01/05/2018 1606 by Rance Muir, RN Outcome: Adequate for Discharge 01/05/2018 1054 by Rance Muir, RN Outcome: Progressing Goal: Will not experience complications related to urinary retention 01/05/2018 1606 by Rance Muir, RN Outcome: Adequate for Discharge 01/05/2018 1054 by Rance Muir, RN Outcome: Progressing   Problem: Pain Managment: Goal: General experience of comfort will improve 01/05/2018 1606 by Rance Muir, RN Outcome: Adequate for Discharge 01/05/2018 1054 by Rance Muir, RN Outcome: Progressing   Problem: Safety: Goal: Ability to remain free from injury will improve 01/05/2018 1606 by Rance Muir, RN Outcome: Adequate for Discharge 01/05/2018 1054 by Rance Muir, RN Outcome: Progressing   Problem: Skin Integrity: Goal: Risk for impaired skin integrity will decrease 01/05/2018 1606 by Rance Muir, RN Outcome: Adequate for Discharge 01/05/2018 1054 by Rance Muir, RN Outcome: Progressing

## 2018-10-15 IMAGING — MR MR MRA HEAD W/O CM
9 of 11 series · 34 of 48 positions shown · non-contrast
Comparison: MRI brain 06/10/2015.  CT head 06/09/2017.

CLINICAL DATA: Neuro deficits, subacute.  Recent fall.

EXAM:
MRI HEAD WITHOUT CONTRAST
MRA HEAD WITHOUT CONTRAST
TECHNIQUE: Multiplanar, multiecho pulse sequences of the brain and surrounding
structures were obtained without intravenous contrast. Angiographic
images of the head were obtained using MRA technique without
contrast.

[Series 2: [id number]_sag · sagittal · 5.0mm · 0.39mm/px · 2 of 20 slices shown]
[im 1/20]
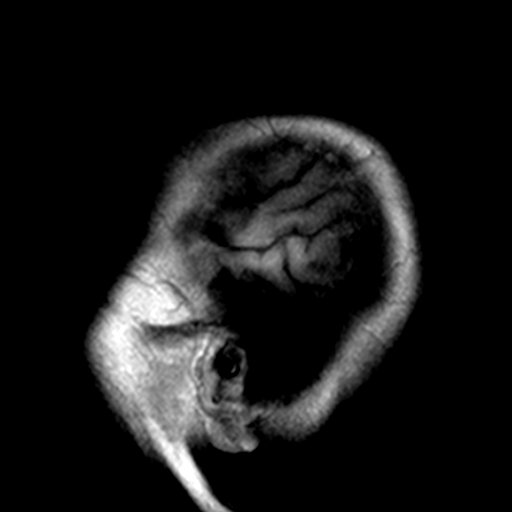
[im 20/20]
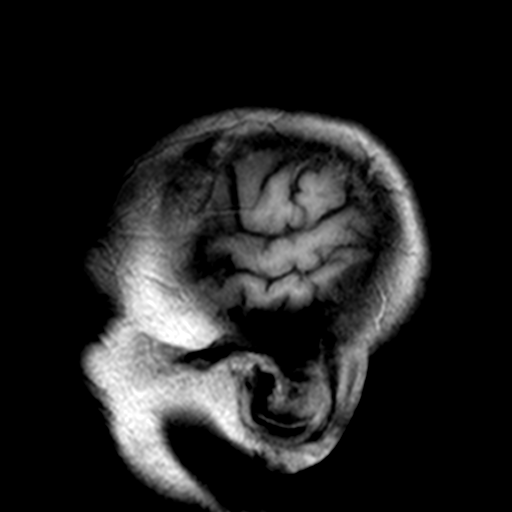

[Series 3: DWI · axial · 3.0mm · 0.66mm/px · z∈[-5,+154]mm · 5 of 55 slices shown (1 of 4)]
[im 1/55]
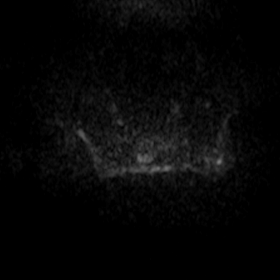
[im 14/55]
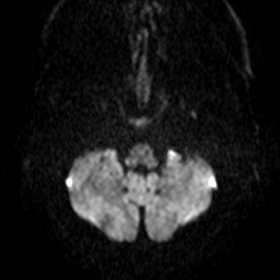
[im 28/55]
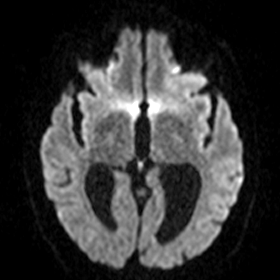
[im 41/55]
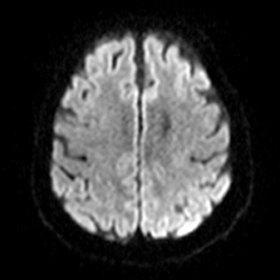
[im 55/55]
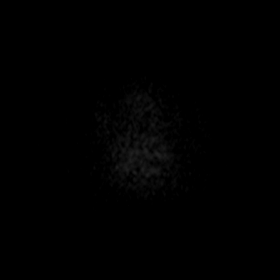

[Series 4: DWI · axial · 3.0mm · 0.72mm/px · z∈[-4,+155]mm · 5 of 53 slices shown (2 of 4)]
[im 1/53]
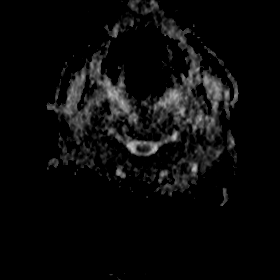
[im 14/53]
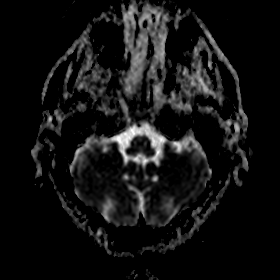
[im 27/53]
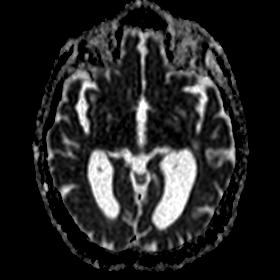
[im 40/53]
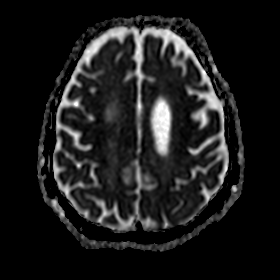
[im 53/53]
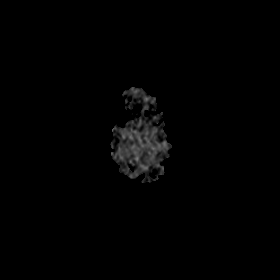

[Series 5: DWI · coronal · 5.0mm · 0.45mm/px · 3 of 34 slices shown (3 of 4)]
[im 1/34]
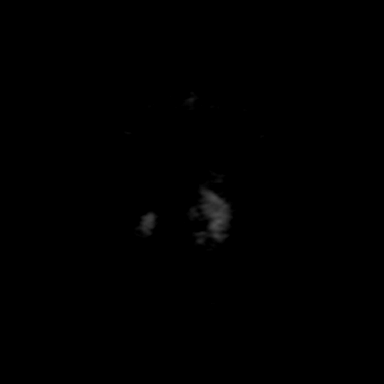
[im 17/34]
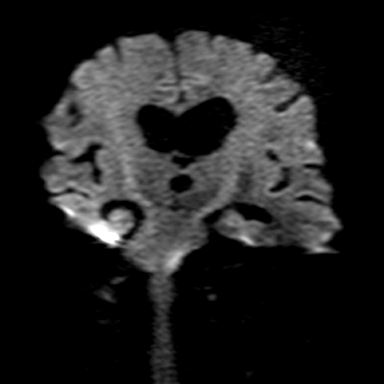
[im 34/34]
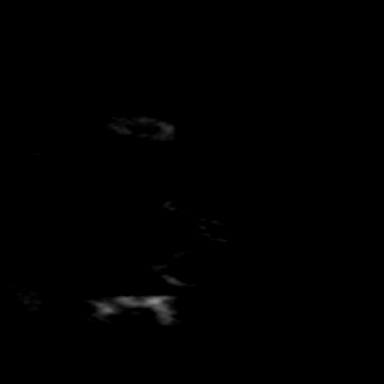

[Series 6: DWI · coronal · 5.0mm · 0.50mm/px · 3 of 34 slices shown (4 of 4)]
[im 1/34]
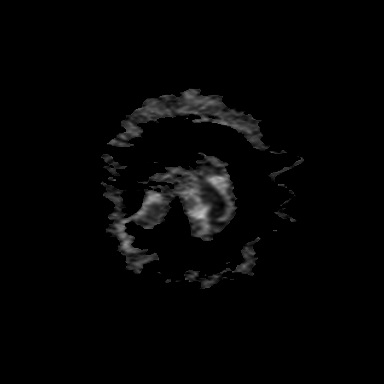
[im 17/34]
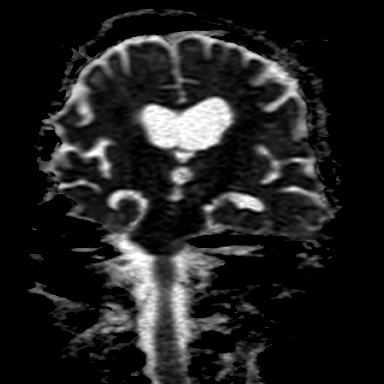
[im 34/34]
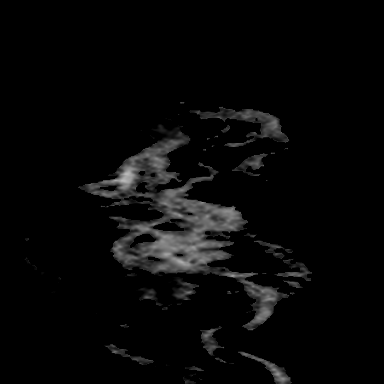

[Series 11: T2 · axial · 5.0mm · 0.65mm/px · z∈[+7,+147]mm · 2 of 23 slices shown (1 of 2)]
[im 1/23]
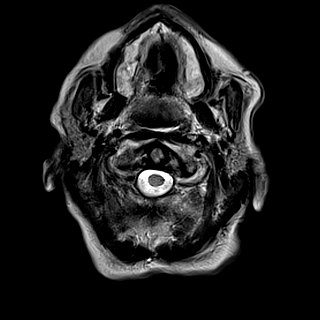
[im 23/23]
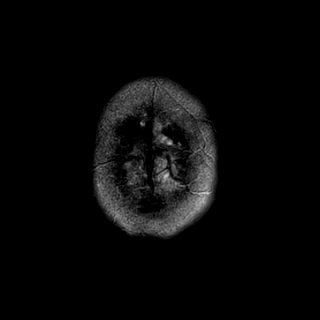

[Series 13: FLAIR · axial · 3.0mm · 0.79mm/px · z∈[+9,+145]mm · 4 of 47 slices shown]
[im 1/47]
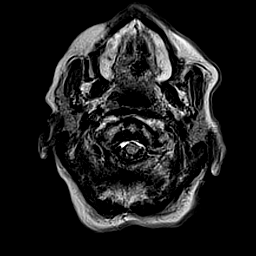
[im 16/47]
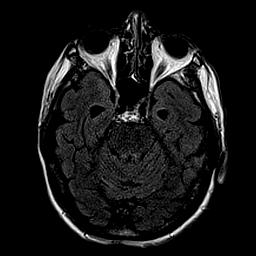
[im 31/47]
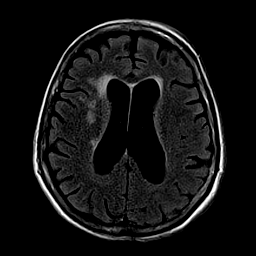
[im 47/47]
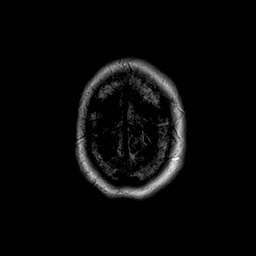

[Series 14: T1 · axial · 2.0mm · 0.40mm/px · z∈[+5,+146]mm · 7 of 73 slices shown]
[im 1/73]
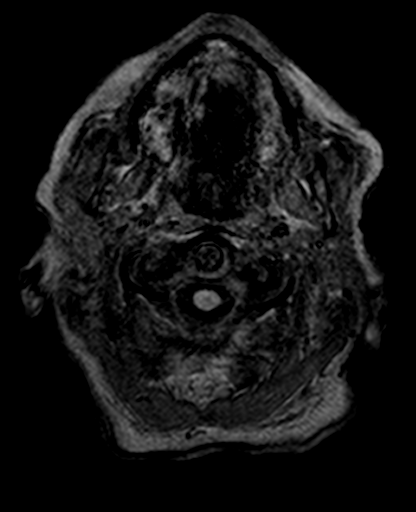
[im 13/73]
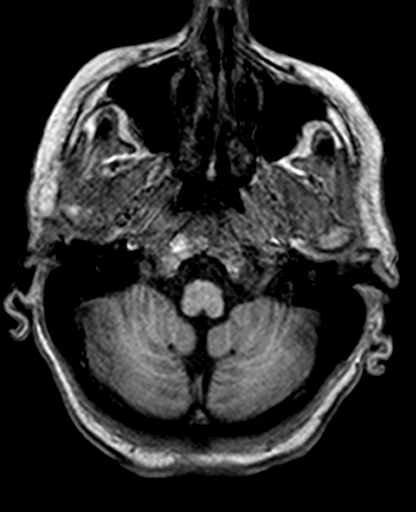
[im 25/73]
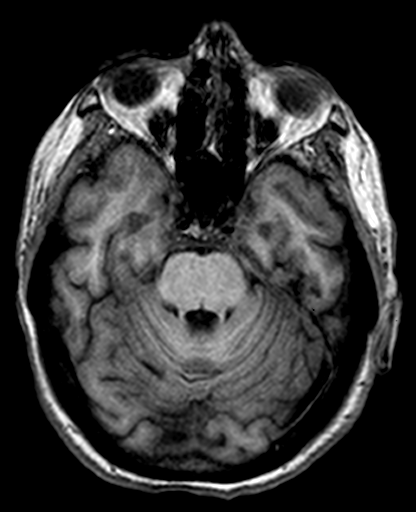
[im 37/73]
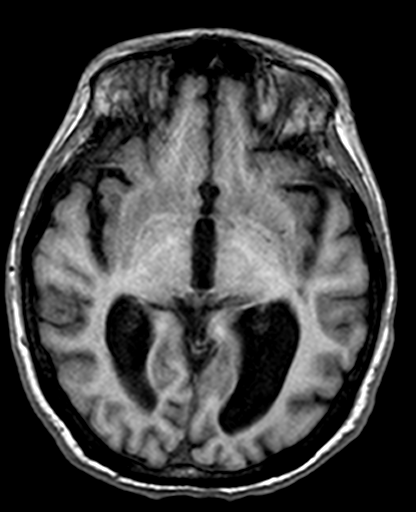
[im 49/73]
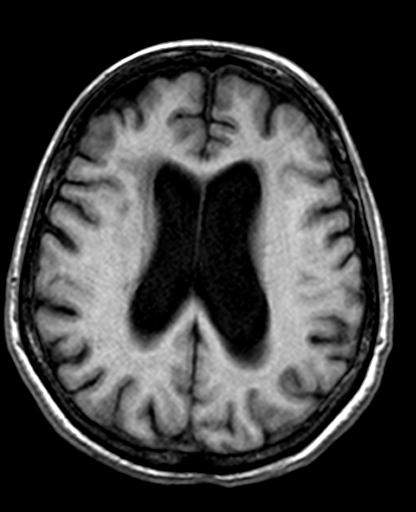
[im 61/73]
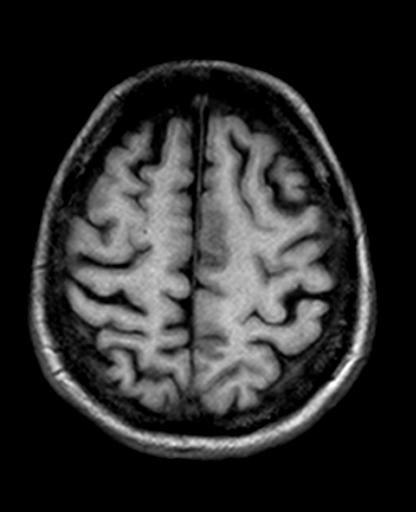
[im 73/73]
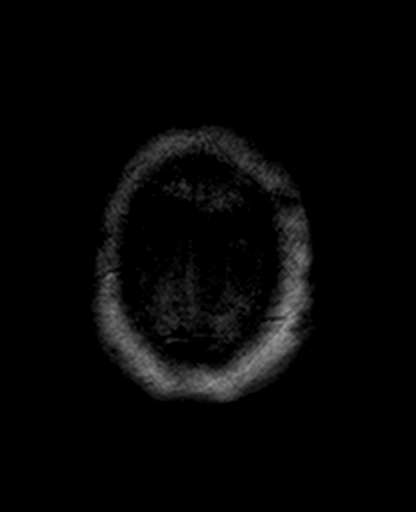

[Series 16: T2 · coronal · 5.0mm · 0.61mm/px · 3 of 28 slices shown (2 of 2)]
[im 1/28]
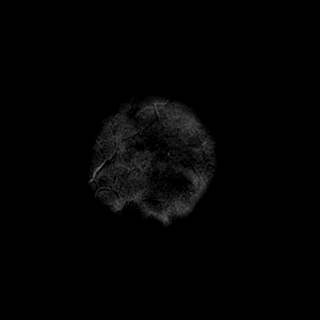
[im 14/28]
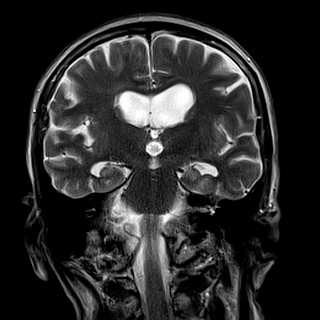
[im 28/28]
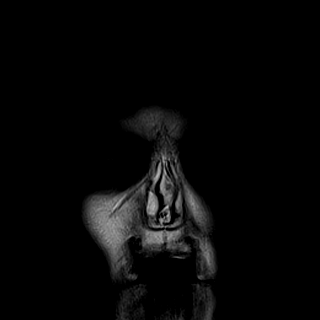

[34 of 48 positions shown; findings below may reference images not displayed]

FINDINGS: MRI HEAD FINDINGS

Brain: A 6 mm nonhemorrhagic white matter infarct is present in the
posterior left corona radiata. Associated T2 signal changes are
present. Asymmetric T2 signal changes are present in the right
lentiform nucleus and corona radiata. A remote lacunar infarct is
again noted within the right cerebellum.

The ventricles are proportionate to the degree of atrophy. No
significant extra-axial fluid collections are present. The internal
auditory canals are within normal limits.

Vascular: Flow is present in the major intracranial arteries.

Skull and upper cervical spine: Skull base is within normal limits.
There is some fluid in the nasopharynx. Craniocervical junction is
otherwise normal.

Sinuses/Orbits: Mild mucosal thickening is present within the
ethmoid air cells bilaterally. Remaining paranasal sinuses and the
mastoid air cells are clear. The globes and orbits are within normal
limits.

MRA HEAD FINDINGS

Internal carotid arteries are within normal limits from the high
cervical segments through the ICA termini bilaterally. The A1 and M1
segments are normal. The left A1 segment is dominant. The anterior
communicating artery is patent. MCA bifurcations are intact. There
is some attenuation of distal MCA branch vessels bilaterally without
a significant proximal stenosis or occlusion.

The left vertebral artery is slightly dominant to the right. PICA
origins are visualized and normal. The basilar artery is normal. The
left posterior cerebral artery emanates from the basilar tip. The
right posterior cerebral artery is of fetal type. There is moderate
attenuation of distal PCA branches without a significant proximal
stenosis or occlusion.
IMPRESSION: 1. 6 mm nonhemorrhagic infarct in the posterior left corona radiata
adjacent to the lateral ventricle.
2. Remote ischemic changes of the basal ganglia and corona radiata
are otherwise asymmetric on the right.
3. Mild generalized atrophy.
4. Remote lacunar infarct of the right cerebellum.
5. MRA demonstrates moderate medium and distal small vessel disease
without significant proximal stenosis, aneurysm, or branch vessel
occlusion.

## 2019-01-15 DIAGNOSIS — Z8639 Personal history of other endocrine, nutritional and metabolic disease: Secondary | ICD-10-CM | POA: Insufficient documentation

## 2019-01-15 DIAGNOSIS — E0591 Thyrotoxicosis, unspecified with thyrotoxic crisis or storm: Secondary | ICD-10-CM

## 2019-01-15 HISTORY — DX: Thyrotoxicosis, unspecified with thyrotoxic crisis or storm: E05.91

## 2019-07-03 ENCOUNTER — Emergency Department (HOSPITAL_COMMUNITY): Payer: Medicare Other

## 2019-07-03 ENCOUNTER — Encounter (HOSPITAL_COMMUNITY): Payer: Self-pay | Admitting: Student

## 2019-07-03 ENCOUNTER — Inpatient Hospital Stay (HOSPITAL_COMMUNITY)
Admission: EM | Admit: 2019-07-03 | Discharge: 2019-07-09 | DRG: 083 | Disposition: A | Discharge status: Skilled Nursing Facility | Payer: Medicare Other | Attending: Neurological Surgery | Admitting: Neurological Surgery

## 2019-07-03 ENCOUNTER — Other Ambulatory Visit: Payer: Self-pay

## 2019-07-03 DIAGNOSIS — K219 Gastro-esophageal reflux disease without esophagitis: Secondary | ICD-10-CM | POA: Diagnosis not present

## 2019-07-03 DIAGNOSIS — I619 Nontraumatic intracerebral hemorrhage, unspecified: Secondary | ICD-10-CM | POA: Diagnosis present

## 2019-07-03 DIAGNOSIS — S066X9A Traumatic subarachnoid hemorrhage with loss of consciousness of unspecified duration, initial encounter: Secondary | ICD-10-CM | POA: Diagnosis present

## 2019-07-03 DIAGNOSIS — G8929 Other chronic pain: Secondary | ICD-10-CM | POA: Diagnosis not present

## 2019-07-03 DIAGNOSIS — Z79891 Long term (current) use of opiate analgesic: Secondary | ICD-10-CM | POA: Diagnosis not present

## 2019-07-03 DIAGNOSIS — R4701 Aphasia: Secondary | ICD-10-CM | POA: Diagnosis present

## 2019-07-03 DIAGNOSIS — R414 Neurologic neglect syndrome: Secondary | ICD-10-CM | POA: Diagnosis not present

## 2019-07-03 DIAGNOSIS — Z7982 Long term (current) use of aspirin: Secondary | ICD-10-CM

## 2019-07-03 DIAGNOSIS — F319 Bipolar disorder, unspecified: Secondary | ICD-10-CM | POA: Diagnosis not present

## 2019-07-03 DIAGNOSIS — Z20822 Contact with and (suspected) exposure to covid-19: Secondary | ICD-10-CM | POA: Diagnosis not present

## 2019-07-03 DIAGNOSIS — I629 Nontraumatic intracranial hemorrhage, unspecified: Secondary | ICD-10-CM

## 2019-07-03 DIAGNOSIS — S06369A Traumatic hemorrhage of cerebrum, unspecified, with loss of consciousness of unspecified duration, initial encounter: Principal | ICD-10-CM | POA: Diagnosis present

## 2019-07-03 DIAGNOSIS — Z79899 Other long term (current) drug therapy: Secondary | ICD-10-CM

## 2019-07-03 DIAGNOSIS — E0590 Thyrotoxicosis, unspecified without thyrotoxic crisis or storm: Secondary | ICD-10-CM | POA: Diagnosis present

## 2019-07-03 DIAGNOSIS — S0990XA Unspecified injury of head, initial encounter: Secondary | ICD-10-CM

## 2019-07-03 DIAGNOSIS — R4182 Altered mental status, unspecified: Secondary | ICD-10-CM | POA: Diagnosis present

## 2019-07-03 DIAGNOSIS — F0390 Unspecified dementia without behavioral disturbance: Secondary | ICD-10-CM | POA: Diagnosis not present

## 2019-07-03 DIAGNOSIS — W19XXXA Unspecified fall, initial encounter: Secondary | ICD-10-CM

## 2019-07-03 DIAGNOSIS — E785 Hyperlipidemia, unspecified: Secondary | ICD-10-CM | POA: Diagnosis present

## 2019-07-03 DIAGNOSIS — F17210 Nicotine dependence, cigarettes, uncomplicated: Secondary | ICD-10-CM | POA: Diagnosis not present

## 2019-07-03 DIAGNOSIS — G709 Myoneural disorder, unspecified: Secondary | ICD-10-CM | POA: Diagnosis not present

## 2019-07-03 DIAGNOSIS — F17200 Nicotine dependence, unspecified, uncomplicated: Secondary | ICD-10-CM | POA: Diagnosis present

## 2019-07-03 DIAGNOSIS — E876 Hypokalemia: Secondary | ICD-10-CM | POA: Diagnosis not present

## 2019-07-03 DIAGNOSIS — W109XXA Fall (on) (from) unspecified stairs and steps, initial encounter: Secondary | ICD-10-CM | POA: Diagnosis present

## 2019-07-03 DIAGNOSIS — Z8673 Personal history of transient ischemic attack (TIA), and cerebral infarction without residual deficits: Secondary | ICD-10-CM | POA: Diagnosis not present

## 2019-07-03 DIAGNOSIS — F419 Anxiety disorder, unspecified: Secondary | ICD-10-CM | POA: Diagnosis not present

## 2019-07-03 DIAGNOSIS — D696 Thrombocytopenia, unspecified: Secondary | ICD-10-CM | POA: Diagnosis present

## 2019-07-03 LAB — BASIC METABOLIC PANEL
Anion gap: 9 (ref 5–15)
BUN: 21 mg/dL (ref 8–23)
CO2: 29 mmol/L (ref 22–32)
Calcium: 9.8 mg/dL (ref 8.9–10.3)
Chloride: 99 mmol/L (ref 98–111)
Creatinine, Ser: 0.59 mg/dL (ref 0.44–1.00)
GFR calc Af Amer: >60 mL/min (ref >60)
GFR calc non Af Amer: >60 mL/min (ref >60)
Glucose, Bld: 100 mg/dL — ABNORMAL HIGH (ref 70–99)
Potassium: 3.4 mmol/L — ABNORMAL LOW (ref 3.5–5.1)
Sodium: 137 mmol/L (ref 135–145)

## 2019-07-03 LAB — CBC WITH DIFFERENTIAL/PLATELET
Abs Immature Granulocytes: 0.03 10*3/uL (ref 0.00–0.07)
Basophils Absolute: 0 10*3/uL (ref 0.0–0.1)
Basophils Relative: 0 %
Eosinophils Absolute: 0 10*3/uL (ref 0.0–0.5)
Eosinophils Relative: 0 %
HCT: 37.5 % (ref 36.0–46.0)
Hemoglobin: 12.3 g/dL (ref 12.0–15.0)
Immature Granulocytes: 0 %
Lymphocytes Relative: 27 %
Lymphs Abs: 2.2 10*3/uL (ref 0.7–4.0)
MCH: 31.5 pg (ref 26.0–34.0)
MCHC: 32.8 g/dL (ref 30.0–36.0)
MCV: 96.2 fL (ref 80.0–100.0)
Monocytes Absolute: 0.7 10*3/uL (ref 0.1–1.0)
Monocytes Relative: 9 %
Neutro Abs: 5.1 10*3/uL (ref 1.7–7.7)
Neutrophils Relative %: 64 %
Platelets: 88 10*3/uL — ABNORMAL LOW (ref 150–400)
RBC: 3.9 MIL/uL (ref 3.87–5.11)
RDW: 14.8 % (ref 11.5–15.5)
WBC: 8 10*3/uL (ref 4.0–10.5)
nRBC: 0 % (ref 0.0–0.2)

## 2019-07-03 LAB — BLOOD GAS, VENOUS
Acid-Base Excess: 7.1 mmol/L — ABNORMAL HIGH (ref 0.0–2.0)
Bicarbonate: 29.5 mmol/L — ABNORMAL HIGH (ref 20.0–28.0)
FIO2: 21
O2 Saturation: 60.8 %
Patient temperature: 36.5
pCO2, Ven: 47.5 mmHg (ref 44.0–60.0)
pH, Ven: 7.435 — ABNORMAL HIGH (ref 7.250–7.430)
pO2, Ven: 33.7 mmHg (ref 32.0–45.0)

## 2019-07-03 LAB — URINALYSIS, ROUTINE W REFLEX MICROSCOPIC
Bilirubin Urine: NEGATIVE
Glucose, UA: NEGATIVE mg/dL
Hgb urine dipstick: NEGATIVE
Ketones, ur: NEGATIVE mg/dL
Leukocytes,Ua: NEGATIVE
Nitrite: NEGATIVE
Protein, ur: NEGATIVE mg/dL
Specific Gravity, Urine: 1.016 (ref 1.005–1.030)
pH: 7 (ref 5.0–8.0)

## 2019-07-03 LAB — SARS CORONAVIRUS 2 BY RT PCR (HOSPITAL ORDER, PERFORMED IN ~~LOC~~ HOSPITAL LAB): SARS Coronavirus 2: NEGATIVE

## 2019-07-03 LAB — MRSA PCR SCREENING: MRSA by PCR: NEGATIVE

## 2019-07-03 LAB — TSH: TSH: 0.805 u[IU]/mL (ref 0.350–4.500)

## 2019-07-03 LAB — HIV ANTIBODY (ROUTINE TESTING W REFLEX): HIV Screen 4th Generation wRfx: NONREACTIVE

## 2019-07-03 LAB — T4, FREE: Free T4: 0.69 ng/dL (ref 0.61–1.12)

## 2019-07-03 LAB — TROPONIN I (HIGH SENSITIVITY)
Troponin I (High Sensitivity): 5 ng/L (ref <18)
Troponin I (High Sensitivity): 6 ng/L (ref <18)

## 2019-07-03 MED ORDER — SENNA 8.6 MG PO TABS
1.0000 | ORAL_TABLET | Freq: Two times a day (BID) | ORAL | Status: DC
  Administered 2019-07-04 – 2019-07-09 (×11): 8.6 mg via ORAL
  Filled 2019-07-03 (×11): qty 1

## 2019-07-03 MED ORDER — HYDRALAZINE HCL 20 MG/ML IJ SOLN
5.0000 mg | Freq: Once | INTRAMUSCULAR | Status: AC
  Administered 2019-07-03: 5 mg via INTRAVENOUS
  Filled 2019-07-03: qty 1

## 2019-07-03 MED ORDER — ACETAMINOPHEN 325 MG PO TABS: 650.0000 mg | ORAL_TABLET | Freq: Four times a day (QID) | ORAL | Status: DC | PRN

## 2019-07-03 MED ORDER — CHLORHEXIDINE GLUCONATE CLOTH 2 % EX PADS
6.0000 | MEDICATED_PAD | Freq: Every day | CUTANEOUS | Status: DC
  Administered 2019-07-03 – 2019-07-09 (×5): 6 via TOPICAL

## 2019-07-03 MED ORDER — SODIUM CHLORIDE 1 G PO TABS: 1.0000 g | ORAL_TABLET | Freq: Three times a day (TID) | ORAL | Status: DC

## 2019-07-03 MED ORDER — TRAZODONE HCL 100 MG PO TABS
100.0000 mg | ORAL_TABLET | Freq: Every day | ORAL | Status: DC
  Administered 2019-07-04 – 2019-07-08 (×5): 100 mg via ORAL
  Filled 2019-07-03 (×2): qty 1
  Filled 2019-07-03: qty 2
  Filled 2019-07-03 (×2): qty 1

## 2019-07-03 MED ORDER — METHIMAZOLE 10 MG PO TABS
10.0000 mg | ORAL_TABLET | ORAL | Status: DC
  Administered 2019-07-05 – 2019-07-09 (×3): 10 mg via ORAL
  Filled 2019-07-03 (×3): qty 1

## 2019-07-03 MED ORDER — POTASSIUM CHLORIDE CRYS ER 10 MEQ PO TBCR
10.0000 meq | EXTENDED_RELEASE_TABLET | ORAL | Status: DC
  Administered 2019-07-05 – 2019-07-09 (×4): 10 meq via ORAL
  Filled 2019-07-03 (×4): qty 1

## 2019-07-03 MED ORDER — ACETAMINOPHEN 650 MG RE SUPP: 650.0000 mg | Freq: Four times a day (QID) | RECTAL | Status: DC | PRN

## 2019-07-03 MED ORDER — HYDRALAZINE HCL 20 MG/ML IJ SOLN
5.0000 mg | INTRAMUSCULAR | Status: DC | PRN
  Administered 2019-07-03 – 2019-07-04 (×2): 5 mg via INTRAVENOUS
  Filled 2019-07-03 (×2): qty 1

## 2019-07-03 MED ORDER — SODIUM CHLORIDE 0.9 % IV BOLUS
500.0000 mL | Freq: Once | INTRAVENOUS | Status: AC
  Administered 2019-07-03: 500 mL via INTRAVENOUS

## 2019-07-03 MED ORDER — DIVALPROEX SODIUM ER 500 MG PO TB24
1000.0000 mg | ORAL_TABLET | Freq: Every day | ORAL | Status: DC
  Filled 2019-07-03 (×2): qty 2

## 2019-07-03 MED ORDER — VITAMIN D 25 MCG (1000 UNIT) PO TABS
1000.0000 [IU] | ORAL_TABLET | Freq: Every day | ORAL | Status: DC
  Administered 2019-07-04 – 2019-07-09 (×6): 1000 [IU] via ORAL
  Filled 2019-07-03 (×6): qty 1

## 2019-07-03 MED ORDER — VITAMIN B-12 1000 MCG PO TABS
1000.0000 ug | ORAL_TABLET | Freq: Every day | ORAL | Status: DC
  Administered 2019-07-05 – 2019-07-09 (×5): 1000 ug via ORAL
  Filled 2019-07-03 (×6): qty 1

## 2019-07-03 MED ORDER — ADULT MULTIVITAMIN W/MINERALS CH
1.0000 | ORAL_TABLET | Freq: Every day | ORAL | Status: DC
  Administered 2019-07-04 – 2019-07-09 (×6): 1 via ORAL
  Filled 2019-07-03 (×6): qty 1

## 2019-07-03 MED ORDER — ATORVASTATIN CALCIUM 10 MG PO TABS
20.0000 mg | ORAL_TABLET | Freq: Every day | ORAL | Status: DC
  Administered 2019-07-04 – 2019-07-08 (×5): 20 mg via ORAL
  Filled 2019-07-03 (×5): qty 2

## 2019-07-03 MED ORDER — POLYETHYLENE GLYCOL 3350 17 G PO PACK: 17.0000 g | PACK | Freq: Every day | ORAL | Status: DC | PRN

## 2019-07-03 MED ORDER — BISACODYL 10 MG RE SUPP: 10.0000 mg | Freq: Every day | RECTAL | Status: DC | PRN

## 2019-07-03 MED ORDER — RISPERIDONE 2 MG PO TABS
2.0000 mg | ORAL_TABLET | Freq: Every day | ORAL | Status: DC
  Administered 2019-07-04 – 2019-07-08 (×5): 2 mg via ORAL
  Filled 2019-07-03 (×7): qty 1

## 2019-07-03 MED ORDER — PANTOPRAZOLE SODIUM 40 MG PO TBEC
40.0000 mg | DELAYED_RELEASE_TABLET | Freq: Every day | ORAL | Status: DC
  Administered 2019-07-04 – 2019-07-09 (×6): 40 mg via ORAL
  Filled 2019-07-03 (×6): qty 1

## 2019-07-03 MED ORDER — DOCUSATE SODIUM 100 MG PO CAPS
100.0000 mg | ORAL_CAPSULE | Freq: Two times a day (BID) | ORAL | Status: DC
  Administered 2019-07-04 – 2019-07-09 (×11): 100 mg via ORAL
  Filled 2019-07-03 (×11): qty 1

## 2019-07-03 MED ORDER — BENZTROPINE MESYLATE 1 MG PO TABS
1.0000 mg | ORAL_TABLET | Freq: Two times a day (BID) | ORAL | Status: DC
  Administered 2019-07-04 – 2019-07-09 (×11): 1 mg via ORAL
  Filled 2019-07-03 (×14): qty 1

## 2019-07-03 MED ORDER — ONDANSETRON HCL 4 MG PO TABS: 4.0000 mg | ORAL_TABLET | Freq: Four times a day (QID) | ORAL | Status: DC | PRN

## 2019-07-03 MED ORDER — SODIUM CHLORIDE 0.9 % IV SOLN: INTRAVENOUS | Status: DC

## 2019-07-03 MED ORDER — GABAPENTIN 100 MG PO CAPS
100.0000 mg | ORAL_CAPSULE | Freq: Three times a day (TID) | ORAL | Status: DC
  Administered 2019-07-04 – 2019-07-09 (×15): 100 mg via ORAL
  Filled 2019-07-03 (×16): qty 1

## 2019-07-03 MED ORDER — FUROSEMIDE 20 MG PO TABS
10.0000 mg | ORAL_TABLET | ORAL | Status: DC
  Administered 2019-07-05 – 2019-07-09 (×4): 10 mg via ORAL
  Filled 2019-07-03 (×4): qty 1

## 2019-07-03 MED ORDER — ONDANSETRON HCL 4 MG/2ML IJ SOLN
4.0000 mg | Freq: Four times a day (QID) | INTRAMUSCULAR | Status: DC | PRN
  Administered 2019-07-04: 4 mg via INTRAVENOUS
  Filled 2019-07-03: qty 2

## 2019-07-03 MED ORDER — HYDROCODONE-ACETAMINOPHEN 5-325 MG PO TABS
1.0000 | ORAL_TABLET | ORAL | Status: DC | PRN
  Administered 2019-07-09: 1 via ORAL
  Filled 2019-07-03: qty 1
  Filled 2019-07-03: qty 2

## 2019-07-03 MED ORDER — METOPROLOL TARTRATE 5 MG/5ML IV SOLN
5.0000 mg | Freq: Four times a day (QID) | INTRAVENOUS | Status: DC | PRN
  Administered 2019-07-03 – 2019-07-04 (×2): 5 mg via INTRAVENOUS
  Filled 2019-07-03 (×2): qty 5

## 2019-07-03 NOTE — ED Notes (Signed)
SBP 154 Prn given  For BP

## 2019-07-03 NOTE — Progress Notes (Signed)
Pts BP still elevated (166/93) even after PRN medication administered. On-call provider notified and new orders received. New PRN medication administered per MAR.

## 2019-07-03 NOTE — Consult Note (Signed)
Medical Consultation   Cassidy Smith  GYB:[id number]  DOB: 05-Feb-1955  DOA: 07/03/2019  PCP: Neale Burly, MD   Outpatient Specialists: None   Requesting physician: Jones/Meyran  Reason for consultation: Medical management  History of Present Illness: Cassidy Smith is an 64 y.o. female with h/o SIADH; CVA; bipolar; and dementia who presented as a transfer from [place] after a fall with SAH/IVH.  The patient is able to follow commands but appears to exhibit LUE neglect and weakness.  She did not attempt to verbalize throughout my evaluation.  Family was not present.   Review of Systems:  ROS Unable to perform   Past Medical History: Past Medical History:  Diagnosis Date  . Anxiety   . Bipolar 1 disorder ([place])   . Chronic hip pain   . Chronic kidney disease    uti  currently,  hx bladder spasms  . Depression   . Headache(784.0)   . Hyperthyroidism   . Ischemic stroke ([place])   . Neuromuscular disorder (H[facility])    shaking of hands   . SIADH (syndrome of inappropriate ADH production) ([place]) 04/08/2017    Past Surgical History: Past Surgical History:  Procedure Laterality Date  . COLONOSCOPY WITH PROPOFOL N/A 04/05/2017   Procedure: COLONOSCOPY WITH PROPOFOL;  Surgeon: Rogene Houston, MD;  Location: [facility];  Service: Endoscopy;  Laterality: N/A;  . INTRAMEDULLARY (IM) NAIL INTERTROCHANTERIC Right 03/07/2017   Procedure: OPEN TREATMENT INTERNAL FIXATION RIGHT HIP WITH GAMA INTRAMEDULARY NAIL;  Surgeon: Carole Civil, MD;  Location: [facility];  Service: Orthopedics;  Laterality: Right;  . MULTIPLE EXTRACTIONS WITH ALVEOLOPLASTY N/A 10/30/2012   Procedure: MULTIPLE EXTRACION 5, 6, 8, 9, 10 ,18, 19, 31 WITH MAXILLARY RIGHT AND LEFT  ALVEOLOPLASTY REDUCE MAXILLARY LEFT TUBEROSITY;  Surgeon: Gae Bon, DDS;  Location: [place];  Service: Oral Surgery;  Laterality: N/A;  . POLYPECTOMY  04/05/2017   Procedure: POLYPECTOMY;  Surgeon: Rogene Houston, MD;   Location: [facility];  Service: Endoscopy;;  recto-sigmoid, rectum     Allergies:  No Known Allergies   Social History:  reports that she quit smoking about 2 years ago. Her smoking use included cigarettes. She has a 10.00 pack-year smoking history. She has never used smokeless tobacco. She reports that she does not drink alcohol and does not use drugs.   Family History: Family History  Problem Relation Age of Onset  . Breast cancer Mother   . Cancer - Other Mother   . Alcoholism Father   . Colon cancer Neg Hx   . Colon polyps Neg Hx       Physical Exam: Vitals:   07/03/19 1441 07/03/19 1522 07/03/19 1538 07/03/19 1602  BP: (!) 167/88 (!) 157/102 (!) 143/74 106/84  Pulse:  87    Resp:  20 (!) 25 18  Temp:  (!) 97.5 F (36.4 C)    TempSrc:  Oral    SpO2:  97% 94%   Weight:      Height:        Constitutional: Arouses to voice/touch, follows commands, nonverbal Eyes: PERLA, EOMI, irises appear normal, anicteric sclera,  ENMT: external ears and nose appear normal, normal hearing, Lips appear normal, oropharynx mucosa, tongue appear normal  Neck: neck appears normal, no masses, normal ROM, no thyromegaly, no JVD  CVS: S1-S2 clear, no murmur rubs or gallops, no LE edema, normal pedal pulses  Respiratory:  clear to auscultation bilaterally, no wheezing, rales or rhonchi. Respiratory effort normal. No accessory muscle use.  Abdomen: soft nontender, nondistended Musculoskeletal: : no cyanosis, clubbing or edema noted bilaterally Neuro: Cranial nerves grossly II-XII intact on limited exam but appears to have left-sided neglect and LUE weakness particularly distally Psych: nonverbal, opens eyes to voice and follows commands other than with LUE Skin: no rashes or lesions or ulcers, no induration or nodules    Data reviewed:  I have personally reviewed the recent labs and imaging studies  Pertinent Labs:   VBG: 7.435/47.5/29.5 HS troponin 5, 6 WBC 8.0 Platelets 88 UA  unremarkable   Inpatient Medications:   Scheduled Meds: . [START ON 07/04/2019] atorvastatin  20 mg Oral q1800  . benztropine  1 mg Oral BID  . [START ON 07/04/2019] cholecalciferol  1,000 Units Oral Daily  . divalproex  1,000 mg Oral QHS  . docusate sodium  100 mg Oral BID  . furosemide  10 mg Oral QODAY  . gabapentin  100 mg Oral TID  . methimazole  10 mg Oral QODAY  . multivitamin with minerals  1 tablet Oral Daily  . pantoprazole  40 mg Oral Daily  . potassium chloride  10 mEq Oral QODAY  . risperiDONE  2 mg Oral QHS  . senna  1 tablet Oral BID  . traZODone  100 mg Oral QHS  . vitamin B-12  1,000 mcg Oral Daily   Continuous Infusions: . sodium chloride       Radiological Exams on Admission: CT Head Wo Contrast  Result Date: 07/03/2019 CLINICAL DATA:  Polytrauma, critical. Fall yesterday with altered mental status since. EXAM: CT HEAD WITHOUT CONTRAST CT CERVICAL SPINE WITHOUT CONTRAST TECHNIQUE: Multidetector CT imaging of the head and cervical spine was performed following the standard protocol without intravenous contrast. Multiplanar CT image reconstructions of the cervical spine were also generated. COMPARISON:  CT head without contrast 08/14/2017. MR head without contrast 08/15/2017. MR cervical spine 06/11/2017. FINDINGS: CT HEAD FINDINGS Brain: Hemorrhagic contusion is noted in the right temporal tip. Adjacent subarachnoid hemorrhage is present. Intraventricular hemorrhage is seen layering in the posterior horns bilaterally. Associated edema contributes to effacement of the sulci in the anterior right temporal lobe. No significant midline shift is present. Ventricles are chronically dilated. No acute change or hydrocephalus is present. Periventricular white matter changes are stable. No ischemic infarcts are evident. 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 Vascular: Atherosclerotic calcifications are present within the cavernous internal carotid arteries. No  hyperdense vessel is present. Skull: No acute fracture or extracranial soft tissue injury is evident. Sinuses/Orbits: The paranasal sinuses and mastoid air cells are clear. The globes and orbits are within normal limits. CT CERVICAL SPINE FINDINGS Alignment: Minimal degenerative anterolisthesis is present at C3-4 and C4-5. Skull base and vertebrae: Craniocervical junction is normal. Vertebral body heights are maintained. No acute or healing fractures are present. Soft tissues and spinal canal: Atherosclerotic calcifications are present at the carotid bifurcations bilaterally, right greater than left. Soft tissues of the neck are otherwise unremarkable. No prevertebral fluid or swelling. No visible canal hematoma. No mass lesion or adenopathy. Disc levels: Uncovertebral and facet disease contribute to right greater than left foraminal narrowing at C3-4, C5-6. Left-sided disease is more significant at C6-7 and C2-3. Upper chest: Lung apices are clear.  Thoracic inlet is normal. IMPRESSION: 1. Hemorrhagic contusion of the right temporal tip with adjacent subarachnoid hemorrhage. 2. Intraventricular hemorrhage layering in the posterior horns bilaterally. 3.  Associated edema contributes to effacement of the sulci in the anterior right temporal lobe. No significant midline shift. 4. Stable atrophy and white matter disease. 5. 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 6. No acute fracture or traumatic subluxation in the cervical spine. 7. Multilevel degenerative changes of the cervical spine as described. Electronically Signed   By: San Morelle M.D.   On: 07/03/2019 11:41   CT Cervical Spine Wo Contrast  Result Date: 07/03/2019 CLINICAL DATA:  Polytrauma, critical. Fall yesterday with altered mental status since. EXAM: CT HEAD WITHOUT CONTRAST CT CERVICAL SPINE WITHOUT CONTRAST TECHNIQUE: Multidetector CT imaging of the head and cervical spine was performed following the standard protocol without  intravenous contrast. Multiplanar CT image reconstructions of the cervical spine were also generated. COMPARISON:  CT head without contrast 08/14/2017. MR head without contrast 08/15/2017. MR cervical spine 06/11/2017. FINDINGS: CT HEAD FINDINGS Brain: Hemorrhagic contusion is noted in the right temporal tip. Adjacent subarachnoid hemorrhage is present. Intraventricular hemorrhage is seen layering in the posterior horns bilaterally. Associated edema contributes to effacement of the sulci in the anterior right temporal lobe. No significant midline shift is present. Ventricles are chronically dilated. No acute change or hydrocephalus is present. Periventricular white matter changes are stable. No ischemic infarcts are evident. 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 Vascular: Atherosclerotic calcifications are present within the cavernous internal carotid arteries. No hyperdense vessel is present. Skull: No acute fracture or extracranial soft tissue injury is evident. Sinuses/Orbits: The paranasal sinuses and mastoid air cells are clear. The globes and orbits are within normal limits. CT CERVICAL SPINE FINDINGS Alignment: Minimal degenerative anterolisthesis is present at C3-4 and C4-5. Skull base and vertebrae: Craniocervical junction is normal. Vertebral body heights are maintained. No acute or healing fractures are present. Soft tissues and spinal canal: Atherosclerotic calcifications are present at the carotid bifurcations bilaterally, right greater than left. Soft tissues of the neck are otherwise unremarkable. No prevertebral fluid or swelling. No visible canal hematoma. No mass lesion or adenopathy. Disc levels: Uncovertebral and facet disease contribute to right greater than left foraminal narrowing at C3-4, C5-6. Left-sided disease is more significant at C6-7 and C2-3. Upper chest: Lung apices are clear.  Thoracic inlet is normal. IMPRESSION: 1. Hemorrhagic contusion of the right temporal tip  with adjacent subarachnoid hemorrhage. 2. Intraventricular hemorrhage layering in the posterior horns bilaterally. 3. Associated edema contributes to effacement of the sulci in the anterior right temporal lobe. No significant midline shift. 4. Stable atrophy and white matter disease. 5. 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 6. No acute fracture or traumatic subluxation in the cervical spine. 7. Multilevel degenerative changes of the cervical spine as described. Electronically Signed   By: San Morelle M.D.   On: 07/03/2019 11:41   DG Chest Portable 1 View  Result Date: 07/03/2019 CLINICAL DATA:  Fall down stairs. EXAM: PORTABLE CHEST 1 VIEW COMPARISON:  April 06, 2019 FINDINGS: Skin folds are identified over the upper lateral right chest. No pneumothorax. The heart, hila, mediastinum, and pleura are normal. No fractures are identified. IMPRESSION: No acute abnormalities. Electronically Signed   By: Dorise Bullion III M.D   On: 07/03/2019 11:31   DG HIP UNILAT WITH PELVIS 2-3 VIEWS RIGHT  Result Date: 07/03/2019 CLINICAL DATA:  Right hip pain after fall EXAM: DG HIP (WITH OR WITHOUT PELVIS) 2-3V RIGHT COMPARISON:  06/16/2017 FINDINGS: Postsurgical changes from prior right hip ORIF with IM nail and screw fixation. No perihardware lucency or  fracture. Similar degree of heterotopic ossification adjacent to the lesser trochanter and superior to the greater trochanter. Hip joint is intact without dislocation. Pelvic bony ring is intact. Vascular calcifications are noted. IMPRESSION: 1. No acute osseous abnormality, right hip. 2. Status post right hip ORIF without evidence of hardware complication. Electronically Signed   By: Davina Poke D.O.   On: 07/03/2019 11:34    Impression/Recommendations Principal Problem:   Closed head injury Active Problems:   Bipolar 1 disorder (HCC)   Current every day smoker   Thrombocytopenia (HCC)   Closed head injury -Patient with AMS after  fall remotely -Imaging shows intraparenchymal hemorrhage in the right temporal lobe -Due to no ICU beds available, patient was sent ER:ER -Further management per Dr. Jones/neurosurgery  Bipolar -Continue Depakote, Cogentin, Risperdal, Trazodone  Tobacco dependence -Uncertain about recent use -Consider addition of nicotine patch  Thrombocytopenia -Chronic, stable  HLD -Continue Lipitor  Hyperthyroidism -Continue methimazole -Does not appear to have been recently tested, will order TSH and free T4     Thank you for this consultation.  Our [facility] hospitalist team will follow the patient with you.   Time Spent: 50 minutes  Karmen Bongo M.D. [facility] 07/03/2019, 6:44 PM

## 2019-07-03 NOTE — ED Notes (Signed)
[facility] called to set up transport for ED to ED transfer to [facility].

## 2019-07-03 NOTE — ED Provider Notes (Signed)
[facility] Provider Note   CSN: [id number] Arrival date & time: 07/03/19  1020     History Chief Complaint  Patient presents with  . [place] is a 64 y.o. female history of ischemic stroke, chronic kidney disease, bipolar disorder, presented to emergency department with fall downstairs and altered mental status.  Patient reportedly fell down approximately 6 wooden stairs yesterday while at home.  Family members noted that she had been altered and confused since then.  They called EMS today arrived in the house.  Patient was poor the pinpoint pupils in their arrival.  There is no reported history of narcotic use.  The patient is on trazadone at home but no reported hx of overdose.  On arrival the patient is somnolent and cannot answer questions, but does awaken to my voice.  HPI     Past Medical History:  Diagnosis Date  . Anxiety   . Bipolar 1 disorder ([place])   . Chronic hip pain   . Chronic kidney disease    uti  currently,  hx bladder spasms  . Depression   . Headache(784.0)   . Hyperthyroidism   . Ischemic stroke ([place])   . Neuromuscular disorder (H[facility])    shaking of hands   . 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 Closed head injury 07/03/2019  . UTI (urinary tract infection) 01/04/2018  . Neuromuscular disorder ([place])   . Dysuria 10/30/2017  . Fever 08/15/2017  . Hypokalemia 08/15/2017  . Altered mental state 08/15/2017  . Acute metabolic encephalopathy 56/38/7564  . Valproic acid toxicity 08/15/2017  . Altered mental status   . Ischemic stroke ([place])   . GERD (gastroesophageal reflux disease) 05/12/2017  . GI bleed   . 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 Dysplastic rectal polyp   . Protein-calorie malnutrition, severe 04/07/2017  . Pressure injury of skin 04/04/2017  . Anticoagulated 04/03/2017  . Stool  bloody 04/03/2017  . Current every day smoker 04/03/2017  . Rectal bleeding   . Chronic diarrhea   . Hyperthyroidism   . S/P ORIF (open reduction internal fixation) fracture right hip IM nail 03/07/17 03/24/2017  . Hypomagnesemia 03/07/2017  . Fall   . Closed intertrochanteric fracture of hip, right, initial encounter ([place]) 03/06/2017  . Radial styloid fracture: right 03/06/2017  . Anemia 03/06/2017  . Orthostatic hypotension 06/07/2015  . Lower urinary tract infectious disease 06/07/2015  . Rhabdomyolysis 06/07/2015  . White matter abnormality on MRI of brain 05/14/2013  . History of depression 05/14/2013  . Hx of anxiety disorder 05/14/2013  . Bipolar I disorder, most recent episode (or current) manic, unspecified 05/14/2013  . Bipolar 1 disorder ([place]) 05/14/2013  . Hyponatremia 05/12/2013  . Bipolar disorder ([place]) 05/12/2013  . Lacunar infarct, acute ([place]) 05/12/2013    Past Surgical History:  Procedure Laterality Date  . COLONOSCOPY WITH PROPOFOL N/A 04/05/2017   Procedure: COLONOSCOPY WITH PROPOFOL;  Surgeon: Rogene Houston, MD;  Location: [facility];  Service: Endoscopy;  Laterality: N/A;  . INTRAMEDULLARY (IM) NAIL INTERTROCHANTERIC Right 03/07/2017   Procedure: OPEN TREATMENT INTERNAL FIXATION RIGHT HIP WITH GAMA INTRAMEDULARY NAIL;  Surgeon: Carole Civil, MD;  Location: [facility] ORS;  Service: Orthopedics;  Laterality: Right;  . MULTIPLE EXTRACTIONS WITH ALVEOLOPLASTY N/A 10/30/2012   Procedure: MULTIPLE EXTRACION 5, 6, 8, 9, 10 ,18, 19, 31 WITH MAXILLARY RIGHT  AND LEFT  ALVEOLOPLASTY REDUCE MAXILLARY LEFT TUBEROSITY;  Surgeon: Gae Bon, DDS;  Location: [facility];  Service: Oral Surgery;  Laterality: N/A;  . POLYPECTOMY  04/05/2017   Procedure: POLYPECTOMY;  Surgeon: Rogene Houston, MD;  Location: [facility];  Service: Endoscopy;;  recto-sigmoid, rectum     OB History   No obstetric history on file.     Family History  Problem Relation Age of Onset  .  Breast cancer Mother   . Cancer - Other Mother   . Alcoholism Father   . Colon cancer Neg Hx   . Colon polyps Neg Hx     Social History   Tobacco Use  . Smoking status: Former Smoker    Packs/day: 0.50    Years: 20.00    Pack years: 10.00    Types: Cigarettes    Quit date: 05/05/2017    Years since quitting: 2.1  . Smokeless tobacco: Never Used  . Tobacco comment: patient states she quit one week ago  Vaping Use  . Vaping Use: Never used  Substance Use Topics  . Alcohol use: No  . Drug use: No    Home Medications Prior to Admission medications   Medication Sig Start Date End Date Taking? Authorizing Provider  aspirin EC 81 MG tablet Take 81 mg by mouth daily. Swallow whole.   Yes [provider]  atorvastatin (LIPITOR) 20 MG tablet Take 1 tablet (20 mg total) by mouth daily at 6 PM. 06/11/17  Yes Barton Dubois, MD  benztropine (COGENTIN) 1 MG tablet Take 1 tablet (1 mg total) by mouth 2 (two) times daily. 01/05/18  Yes Johnson, Clanford L, MD  Cholecalciferol (VITAMIN D-3) 125 MCG (5000 UT) TABS Take 5,000 Units by mouth daily.   Yes [provider]  divalproex (DEPAKOTE ER) 500 MG 24 hr tablet Take 2 tablets (1,000 mg total) by mouth at bedtime. Patient taking differently: Take 1,500 mg by mouth at bedtime.  08/16/17  Yes Tat, Shanon Brow, MD  furosemide (LASIX) 20 MG tablet Take 0.5 tablets (10 mg total) by mouth every other day. Patient taking differently: Take 10 mg by mouth daily.  01/08/18  Yes Johnson, Clanford L, MD  gabapentin (NEURONTIN) 100 MG capsule Take 1 capsule (100 mg total) by mouth 3 (three) times daily. Patient taking differently: Take 100-200 mg by mouth See admin instructions. Take one capsule (100 mg) by mouth every morning and two capsules (200 mg) at night 05/13/17  Yes Carole Civil, MD  methimazole (TAPAZOLE) 10 MG tablet Take 10 mg by mouth every other day.  12/24/15  Yes [provider]  Multiple Minerals-Vitamins (CITRACAL  MAXIMUM PLUS) TABS Take 1 tablet by mouth daily. Per tablet: Vitamin D3 500 I.U., Calcium 325 mg, Zinc 2.75 mg, copper 0.225 mg, manganese 0.575 mg, sodium 2.5 mg   Yes [provider]  Multiple Vitamin (MULTIVITAMIN WITH MINERALS) TABS tablet Take 1 tablet by mouth daily. Centrum Silver   Yes [provider]  pantoprazole (PROTONIX) 40 MG tablet Take 40 mg by mouth daily.   Yes [provider]  potassium chloride (K-DUR,KLOR-CON) 10 MEQ tablet Take 1 tablet (10 mEq total) by mouth every other day. Patient taking differently: Take 10 mEq by mouth daily.  01/08/18  Yes Johnson, Clanford L, MD  risperiDONE (RISPERDAL) 2 MG tablet Take 2 mg by mouth at bedtime. 06/21/19  Yes [provider]  traZODone (DESYREL) 100 MG tablet Take 100 mg by mouth at bedtime.  Yes [provider]  vitamin B-12 (CYANOCOBALAMIN) 1000 MCG tablet Take 1 tablet (1,000 mcg total) by mouth daily. 06/11/17  Yes Barton Dubois, MD  risperiDONE (RISPERDAL) 1 MG tablet Take 1 tablet (1 mg total) by mouth at bedtime as needed for up to 15 days (agitation). Patient not taking: Reported on 07/03/2019 01/05/18 01/20/18  Irwin Brakeman L, MD  sodium chloride 1 g tablet Take 1 tablet (1 g total) by mouth 3 (three) times daily with meals. Patient not taking: Reported on 07/03/2019 01/05/18   Murlean Iba, MD    Allergies    Patient has no known allergies.  Review of Systems   Review of Systems  Unable to perform ROS: Patient nonverbal (level 5 caveat)    Physical Exam Updated Vital Signs BP 106/84   Pulse 87   Temp (!) 97.5 F (36.4 C) (Oral)   Resp 18   Ht 5\' 6"  (1.676 m)   Wt 54.4 kg   SpO2 94%   BMI 19.37 kg/m   Physical Exam Vitals and nursing note reviewed.  Constitutional:      Appearance: She is well-developed.     Comments: Thin  HENT:     Head: Normocephalic.  Eyes:     Conjunctiva/sclera: Conjunctivae normal.     Pupils: Pupils are equal, round, and  reactive to light.  Neck:     Comments: C spine collar in place No spinal stepoffs or deformities Cardiovascular:     Rate and Rhythm: Normal rate and regular rhythm.     Pulses: Normal pulses.  Pulmonary:     Effort: Pulmonary effort is normal. No respiratory distress.  Abdominal:     Palpations: Abdomen is soft.     Tenderness: There is no abdominal tenderness.  Musculoskeletal:     Comments: Passive full ROM of the joints without significant tenderness  Skin:    General: Skin is warm and dry.  Neurological:     GCS: GCS eye subscore is 3. GCS verbal subscore is 2. GCS motor subscore is 5.     ED Results / Procedures / Treatments   Labs (all labs ordered are listed, but only abnormal results are displayed) Labs Reviewed  BLOOD GAS, VENOUS - Abnormal; Notable for the following components:      Result Value   pH, Ven 7.435 (*)    Bicarbonate 29.5 (*)    Acid-Base Excess 7.1 (*)    All other components within normal limits  CBC WITH DIFFERENTIAL/PLATELET - Abnormal; Notable for the following components:   Platelets 88 (*)    All other components within normal limits  BASIC METABOLIC PANEL - Abnormal; Notable for the following components:   Potassium 3.4 (*)    Glucose, Bld 100 (*)    All other components within normal limits  URINALYSIS, ROUTINE W REFLEX MICROSCOPIC - Abnormal; Notable for the following components:   Color, Urine AMBER (*)    APPearance HAZY (*)    All other components within normal limits  SARS CORONAVIRUS 2 BY RT PCR (HOSPITAL ORDER, [facility])  TROPONIN I (HIGH SENSITIVITY)  TROPONIN I (HIGH SENSITIVITY)    EKG EKG Interpretation  Date/Time:  Saturday July 03 2019 11:11:46 EDT Ventricular Rate:  62 PR Interval:    QRS Duration: 113 QT Interval:  422 QTC Calculation: 429 R Axis:   -53 Text Interpretation: Sinus rhythm Borderline IVCD with LAD RSR' in V1 or V2, right VCD or RVH Probable inferior infarct, old  No  STEMI Confirmed by Octaviano Glow [phone]) on 07/03/2019 12:11:54 PM   Radiology CT Head Wo Contrast  Result Date: 07/03/2019 CLINICAL DATA:  Polytrauma, critical. Fall yesterday with altered mental status since. EXAM: CT HEAD WITHOUT CONTRAST CT CERVICAL SPINE WITHOUT CONTRAST TECHNIQUE: Multidetector CT imaging of the head and cervical spine was performed following the standard protocol without intravenous contrast. Multiplanar CT image reconstructions of the cervical spine were also generated. COMPARISON:  CT head without contrast 08/14/2017. MR head without contrast 08/15/2017. MR cervical spine 06/11/2017. FINDINGS: CT HEAD FINDINGS Brain: Hemorrhagic contusion is noted in the right temporal tip. Adjacent subarachnoid hemorrhage is present. Intraventricular hemorrhage is seen layering in the posterior horns bilaterally. Associated edema contributes to effacement of the sulci in the anterior right temporal lobe. No significant midline shift is present. Ventricles are chronically dilated. No acute change or hydrocephalus is present. Periventricular white matter changes are stable. No ischemic infarcts are evident. 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 Vascular: Atherosclerotic calcifications are present within the cavernous internal carotid arteries. No hyperdense vessel is present. Skull: No acute fracture or extracranial soft tissue injury is evident. Sinuses/Orbits: The paranasal sinuses and mastoid air cells are clear. The globes and orbits are within normal limits. CT CERVICAL SPINE FINDINGS Alignment: Minimal degenerative anterolisthesis is present at C3-4 and C4-5. Skull base and vertebrae: Craniocervical junction is normal. Vertebral body heights are maintained. No acute or healing fractures are present. Soft tissues and spinal canal: Atherosclerotic calcifications are present at the carotid bifurcations bilaterally, right greater than left. Soft tissues of the neck are otherwise  unremarkable. No prevertebral fluid or swelling. No visible canal hematoma. No mass lesion or adenopathy. Disc levels: Uncovertebral and facet disease contribute to right greater than left foraminal narrowing at C3-4, C5-6. Left-sided disease is more significant at C6-7 and C2-3. Upper chest: Lung apices are clear.  Thoracic inlet is normal. IMPRESSION: 1. Hemorrhagic contusion of the right temporal tip with adjacent subarachnoid hemorrhage. 2. Intraventricular hemorrhage layering in the posterior horns bilaterally. 3. Associated edema contributes to effacement of the sulci in the anterior right temporal lobe. No significant midline shift. 4. Stable atrophy and white matter disease. 5. 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 6. No acute fracture or traumatic subluxation in the cervical spine. 7. Multilevel degenerative changes of the cervical spine as described. Electronically Signed   By: San Morelle M.D.   On: 07/03/2019 11:41   CT Cervical Spine Wo Contrast  Result Date: 07/03/2019 CLINICAL DATA:  Polytrauma, critical. Fall yesterday with altered mental status since. EXAM: CT HEAD WITHOUT CONTRAST CT CERVICAL SPINE WITHOUT CONTRAST TECHNIQUE: Multidetector CT imaging of the head and cervical spine was performed following the standard protocol without intravenous contrast. Multiplanar CT image reconstructions of the cervical spine were also generated. COMPARISON:  CT head without contrast 08/14/2017. MR head without contrast 08/15/2017. MR cervical spine 06/11/2017. FINDINGS: CT HEAD FINDINGS Brain: Hemorrhagic contusion is noted in the right temporal tip. Adjacent subarachnoid hemorrhage is present. Intraventricular hemorrhage is seen layering in the posterior horns bilaterally. Associated edema contributes to effacement of the sulci in the anterior right temporal lobe. No significant midline shift is present. Ventricles are chronically dilated. No acute change or hydrocephalus is  present. Periventricular white matter changes are stable. No ischemic infarcts are evident. 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 Vascular: Atherosclerotic calcifications are present within the cavernous internal carotid arteries. No hyperdense vessel is present. Skull: No acute fracture or extracranial  soft tissue injury is evident. Sinuses/Orbits: The paranasal sinuses and mastoid air cells are clear. The globes and orbits are within normal limits. CT CERVICAL SPINE FINDINGS Alignment: Minimal degenerative anterolisthesis is present at C3-4 and C4-5. Skull base and vertebrae: Craniocervical junction is normal. Vertebral body heights are maintained. No acute or healing fractures are present. Soft tissues and spinal canal: Atherosclerotic calcifications are present at the carotid bifurcations bilaterally, right greater than left. Soft tissues of the neck are otherwise unremarkable. No prevertebral fluid or swelling. No visible canal hematoma. No mass lesion or adenopathy. Disc levels: Uncovertebral and facet disease contribute to right greater than left foraminal narrowing at C3-4, C5-6. Left-sided disease is more significant at C6-7 and C2-3. Upper chest: Lung apices are clear.  Thoracic inlet is normal. IMPRESSION: 1. Hemorrhagic contusion of the right temporal tip with adjacent subarachnoid hemorrhage. 2. Intraventricular hemorrhage layering in the posterior horns bilaterally. 3. Associated edema contributes to effacement of the sulci in the anterior right temporal lobe. No significant midline shift. 4. Stable atrophy and white matter disease. 5. 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 6. No acute fracture or traumatic subluxation in the cervical spine. 7. Multilevel degenerative changes of the cervical spine as described. Electronically Signed   By: San Morelle M.D.   On: 07/03/2019 11:41   DG Chest Portable 1 View  Result Date: 07/03/2019 CLINICAL DATA:  Fall down  stairs. EXAM: PORTABLE CHEST 1 VIEW COMPARISON:  April 06, 2019 FINDINGS: Skin folds are identified over the upper lateral right chest. No pneumothorax. The heart, hila, mediastinum, and pleura are normal. No fractures are identified. IMPRESSION: No acute abnormalities. Electronically Signed   By: Dorise Bullion III M.D   On: 07/03/2019 11:31   DG HIP UNILAT WITH PELVIS 2-3 VIEWS RIGHT  Result Date: 07/03/2019 CLINICAL DATA:  Right hip pain after fall EXAM: DG HIP (WITH OR WITHOUT PELVIS) 2-3V RIGHT COMPARISON:  06/16/2017 FINDINGS: Postsurgical changes from prior right hip ORIF with IM nail and screw fixation. No perihardware lucency or fracture. Similar degree of heterotopic ossification adjacent to the lesser trochanter and superior to the greater trochanter. Hip joint is intact without dislocation. Pelvic bony ring is intact. Vascular calcifications are noted. IMPRESSION: 1. No acute osseous abnormality, right hip. 2. Status post right hip ORIF without evidence of hardware complication. Electronically Signed   By: Davina Poke D.O.   On: 07/03/2019 11:34    Procedures .Critical Care Performed by: Wyvonnia Dusky, MD Authorized by: Wyvonnia Dusky, MD   Critical care provider statement:    Critical care time (minutes):  45   Critical care was necessary to treat or prevent imminent or life-threatening deterioration of the following conditions:  CNS failure or compromise   Critical care was time spent personally by me on the following activities:  Discussions with consultants, evaluation of patient's response to treatment, examination of patient, ordering and performing treatments and interventions, ordering and review of laboratory studies, ordering and review of radiographic studies, pulse oximetry, re-evaluation of patient's condition, obtaining history from patient or surrogate and review of old charts Comments:     Intracranial hemorrhage with diminished GCS requiring repeat neuro  checks, goals of care discussion with family, and consult with neurosurgery   (including critical care time)  Medications Ordered in ED Medications  sodium chloride 0.9 % bolus 500 mL (0 mLs Intravenous Stopped 07/03/19 1230)  hydrALAZINE (APRESOLINE) injection 5 mg (5 mg Intravenous Given 07/03/19 1441)    ED  Course  I have reviewed the triage vital signs and the nursing notes.  Pertinent labs & imaging results that were available during my care of the patient were reviewed by me and considered in my medical decision making (see chart for details).  64 yo female here with confusion after a fall down stairs 2 days ago. Unclear as to the cause of her fall  Supplemental hx provided by family members (brothers by phone), who say she has a hx of waxing and waning mental status.  They deny a hx of seizures but she is on antipsychotic and mood medications.    CTH ordered on arrival, confirming diagnosis of ICH.  C spine negative for acute fx - collar cleared.  Xray chest and hip personally reviewed without acute fractures noted.  Labs personally reviewed, platelets 88, hgb normal, BMP largely unremarkable, UA negative for infectious signs, COVID negative.  On exam, she had GCS 10, was maintaining airway.  Given her injury was already 42 hours old, I felt she did not require emergent intubation, and did not have a rapidly expanding ICH.  Clinical Course as of Jul 03 1750  Sat Jul 03, 2019  1117 I spoke to Dr. Jobe Igo of radiology who reports intraparenchymal hemorrhage in the right temporal lobe.  Will consult to neurosurgery   [MT]  1136 Unable ot reach her brother Lorrin Goodell as emergency contact by phone.     [MT]  20 NSGY PA will review images and call me back shortly, will discuss with her attending.   [MT]  [place] team recommending transfer to step down unit at [facility] ([facility] is paging their hospitalist now), NSGY to follow along, no direct recommendations for repeat  imaging, but Q3 neurochecks appropriate.  This was relayed to both of patient's brothers [name] and Lorrin Goodell.  They are unaware of her code status but do request intubation if an emergency arises, while they continue to discuss the prognosis with the rest of the family.   [MT]  3532 At this time I do not believe she requires emergent intubation, she is maintaining her airway adequately, GCS 10.  Will need step down monitoring however   [MT]  1323 Accepted by Dr Ronnald Ramp from [facility].  No ICU beds available, will need ED to ED transfer   [MT]  1332 Accepted for ED to ED transfer by Dr Deno Etienne   [MT]    Clinical Course User Index [MT] Langston Masker Carola Rhine, MD    Final Clinical Impression(s) / ED Diagnoses Final diagnoses:  Intracranial hemorrhage [facility])    Rx / DC Orders ED Discharge Orders    None       Langston Masker Carola Rhine, MD 07/03/19 1753

## 2019-07-03 NOTE — ED Provider Notes (Signed)
MSE was initiated and I personally evaluated the patient and placed orders (if any) at  3:33 PM on July 03, 2019.  The patient appears stable so that the remainder of the MSE may be completed by another provider.  Patient transferred from [facility] to be seen by neurosurgery due to intracranial hemorrhage.  Patient's airway stable.  Will contact neurosurgeon   Lacretia Leigh, MD 07/03/19 1534

## 2019-07-03 NOTE — Progress Notes (Signed)
Pts BP 164/73. Goal <140. PRNs given in ED w/o relief. Dr Claria Dice with [facility] paged. Awaiting MD orders. Will continue to monitor.

## 2019-07-03 NOTE — Progress Notes (Signed)
I was called from [facility] regarding transfer of this patient to [facility].  She is a 64yo with h/o SIADH; CVA; hyperthyroidism; and bipolar.  She presented to [facility] after a fall yesterday with persistent AMS.  GCS is 10.  CT shows hemorrhagic contusion, SAH, and IVH with associated edema.  This patient appears to need neurosurgical evaluation and admission; [facility] is happy to consult for medical issues if there are any.  Recommend ER:ER transfer vs. Neurosurgical acceptance for direct admission to their service at this time.   Carlyon Shadow, M.D.

## 2019-07-03 NOTE — ED Notes (Signed)
Lab at bedside

## 2019-07-03 NOTE — H&P (Signed)
Cassidy Smith is an 64 y.o. female.   HPI:  64 year old female presents to the [facility], ER today after sustaining a fall yesterday.  Her family was concerned about her altered mental status.  History is difficult to obtain from the patient as she is not communicative with me and there is no family at the bedside.  She is able to follow simple commands and arouses easily with verbal stimulation.  After review of her chart, it appears she has a very extensive medical history.  Past Medical History:  Diagnosis Date  . Anxiety   . Bipolar 1 disorder ([place])   . Chronic hip pain   . Chronic kidney disease    uti  currently,  hx bladder spasms  . Depression   . Headache(784.0)   . Hyperthyroidism   . Ischemic stroke ([place])   . Neuromuscular disorder ([facility])    shaking of hands   . 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 COLONOSCOPY WITH PROPOFOL N/A 04/05/2017   Procedure: COLONOSCOPY WITH PROPOFOL;  Surgeon: Rogene Houston, MD;  Location: [facility];  Service: Endoscopy;  Laterality: N/A;  . INTRAMEDULLARY (IM) NAIL INTERTROCHANTERIC Right 03/07/2017   Procedure: OPEN TREATMENT INTERNAL FIXATION RIGHT HIP WITH GAMA INTRAMEDULARY NAIL;  Surgeon: Carole Civil, MD;  Location: [facility] OR[facility];  Service: Orthopedics;  Laterality: Right;  . MULTIPLE EXTRACTIONS WITH ALVEOLOPLASTY N/A 10/30/2012   Procedure: MULTIPLE EXTRACION 5, 6, 8, 9, 10 ,18, 19, 31 WITH MAXILLARY RIGHT AND LEFT  ALVEOLOPLASTY REDUCE MAXILLARY LEFT TUBEROSITY;  Surgeon: Gae Bon, DDS;  Location: [facility];  Service: Oral Surgery;  Laterality: N/A;  . POLYPECTOMY  04/05/2017   Procedure: POLYPECTOMY;  Surgeon: Rogene Houston, MD;  Location: [facility];  Service: Endoscopy;;  recto-sigmoid, rectum    No Known Allergies  Social History   Tobacco Use  . Smoking status: Former Smoker    Packs/day: 0.50    Years: 20.00    Pack years:  10.00    Types: Cigarettes    Quit date: 05/05/2017    Years since quitting: 2.1  . Smokeless tobacco: Never Used  . Tobacco comment: patient states she quit one week ago  Substance Use Topics  . Alcohol use: No    Family History  Problem Relation Age of Onset  . Breast cancer Mother   . Cancer - Other Mother   . Alcoholism Father   . Colon cancer Neg Hx   . Colon polyps Neg Hx      Review of Systems  Positive ROS: As above  All other systems have been reviewed and were otherwise negative with the exception of those mentioned in the HPI and as above.  Objective: Vital signs in last 24 hours: Temp:  [97.5 F (36.4 C)-97.7 F (36.5 C)] 97.5 F (36.4 C) (06/19 1522) Pulse Rate:  [69-87] 87 (06/19 1522) Resp:  [14-25] 18 (06/19 1602) BP: (106-192)/(74-109) 106/84 (06/19 1602) SpO2:  [92 %-97 %] 94 % (06/19 1538) Weight:  [54.4 kg] 54.4 kg (06/19 1036)  General Appearance: Alert, cooperative, no distress, appears stated age Head: Normocephalic, without obvious abnormality, atraumatic Eyes: PERRL, conjunctiva/corneas clear, EOM's intact, fundi benign, both eyes      Lungs:  respirations unlabored Heart: Regular rate and rhythm Pulses: 2+ and symmetric all extremities Skin: Skin color, texture, turgor normal, no rashes or lesions  NEUROLOGIC:   Mental status: Arouses with  verbal stimulation, expressive aphasia, poor attention span, Memory and fund of knowledge Motor Exam - grossly normal, normal tone and bulk, moves all extremities well Sensory Exam - grossly normal Reflexes: symmetric, no pathologic reflexes, No Hoffman's, No clonus Coordination - grossly normal Gait -unable to test Balance -unable to test Cranial Nerves: I: smell Not tested  II: visual acuity  OS: na    OD: na  II: visual fields  unable to test  II: pupils Equal, round, reactive to light  III,VII: ptosis None  III,IV,VI: extraocular muscles  Full ROM  V: mastication Normal  V: facial light touch  sensation  Normal  V,VII: corneal reflex  Present  VII: facial muscle function - upper  Normal  VII: facial muscle function - lower Normal  VIII: hearing Not tested  IX: soft palate elevation  Normal  IX,X: gag reflex Present  XI: trapezius strength  5/5  XI: sternocleidomastoid strength 5/5  XI: neck flexion strength  5/5  XII: tongue strength  Normal    Data Review Lab Results  Component Value Date   WBC 8.0 07/03/2019   HGB 12.3 07/03/2019   HCT 37.5 07/03/2019   MCV 96.2 07/03/2019   PLT 88 (L) 07/03/2019   Lab Results  Component Value Date   NA 137 07/03/2019   K 3.4 (L) 07/03/2019   CL 99 07/03/2019   CO2 29 07/03/2019   BUN 21 07/03/2019   CREATININE 0.59 07/03/2019   GLUCOSE 100 (H) 07/03/2019   Lab Results  Component Value Date   INR 1.01 06/09/2017    Radiology: CT Head Wo Contrast  Result Date: 07/03/2019 CLINICAL DATA:  Polytrauma, critical. Fall yesterday with altered mental status since. EXAM: CT HEAD WITHOUT CONTRAST CT CERVICAL SPINE WITHOUT CONTRAST TECHNIQUE: Multidetector CT imaging of the head and cervical spine was performed following the standard protocol without intravenous contrast. Multiplanar CT image reconstructions of the cervical spine were also generated. COMPARISON:  CT head without contrast 08/14/2017. MR head without contrast 08/15/2017. MR cervical spine 06/11/2017. FINDINGS: CT HEAD FINDINGS Brain: Hemorrhagic contusion is noted in the right temporal tip. Adjacent subarachnoid hemorrhage is present. Intraventricular hemorrhage is seen layering in the posterior horns bilaterally. Associated edema contributes to effacement of the sulci in the anterior right temporal lobe. No significant midline shift is present. Ventricles are chronically dilated. No acute change or hydrocephalus is present. Periventricular white matter changes are stable. No ischemic infarcts are evident. 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 Vascular:  Atherosclerotic calcifications are present within the cavernous internal carotid arteries. No hyperdense vessel is present. Skull: No acute fracture or extracranial soft tissue injury is evident. Sinuses/Orbits: The paranasal sinuses and mastoid air cells are clear. The globes and orbits are within normal limits. CT CERVICAL SPINE FINDINGS Alignment: Minimal degenerative anterolisthesis is present at C3-4 and C4-5. Skull base and vertebrae: Craniocervical junction is normal. Vertebral body heights are maintained. No acute or healing fractures are present. Soft tissues and spinal canal: Atherosclerotic calcifications are present at the carotid bifurcations bilaterally, right greater than left. Soft tissues of the neck are otherwise unremarkable. No prevertebral fluid or swelling. No visible canal hematoma. No mass lesion or adenopathy. Disc levels: Uncovertebral and facet disease contribute to right greater than left foraminal narrowing at C3-4, C5-6. Left-sided disease is more significant at C6-7 and C2-3. Upper chest: Lung apices are clear.  Thoracic inlet is normal. IMPRESSION: 1. Hemorrhagic contusion of the right temporal tip with adjacent subarachnoid hemorrhage. 2. Intraventricular hemorrhage  layering in the posterior horns bilaterally. 3. Associated edema contributes to effacement of the sulci in the anterior right temporal lobe. No significant midline shift. 4. Stable atrophy and white matter disease. 5. 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 6. No acute fracture or traumatic subluxation in the cervical spine. 7. Multilevel degenerative changes of the cervical spine as described. Electronically Signed   By: San Morelle M.D.   On: 07/03/2019 11:41   CT Cervical Spine Wo Contrast  Result Date: 07/03/2019 CLINICAL DATA:  Polytrauma, critical. Fall yesterday with altered mental status since. EXAM: CT HEAD WITHOUT CONTRAST CT CERVICAL SPINE WITHOUT CONTRAST TECHNIQUE: Multidetector CT  imaging of the head and cervical spine was performed following the standard protocol without intravenous contrast. Multiplanar CT image reconstructions of the cervical spine were also generated. COMPARISON:  CT head without contrast 08/14/2017. MR head without contrast 08/15/2017. MR cervical spine 06/11/2017. FINDINGS: CT HEAD FINDINGS Brain: Hemorrhagic contusion is noted in the right temporal tip. Adjacent subarachnoid hemorrhage is present. Intraventricular hemorrhage is seen layering in the posterior horns bilaterally. Associated edema contributes to effacement of the sulci in the anterior right temporal lobe. No significant midline shift is present. Ventricles are chronically dilated. No acute change or hydrocephalus is present. Periventricular white matter changes are stable. No ischemic infarcts are evident. 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 Vascular: Atherosclerotic calcifications are present within the cavernous internal carotid arteries. No hyperdense vessel is present. Skull: No acute fracture or extracranial soft tissue injury is evident. Sinuses/Orbits: The paranasal sinuses and mastoid air cells are clear. The globes and orbits are within normal limits. CT CERVICAL SPINE FINDINGS Alignment: Minimal degenerative anterolisthesis is present at C3-4 and C4-5. Skull base and vertebrae: Craniocervical junction is normal. Vertebral body heights are maintained. No acute or healing fractures are present. Soft tissues and spinal canal: Atherosclerotic calcifications are present at the carotid bifurcations bilaterally, right greater than left. Soft tissues of the neck are otherwise unremarkable. No prevertebral fluid or swelling. No visible canal hematoma. No mass lesion or adenopathy. Disc levels: Uncovertebral and facet disease contribute to right greater than left foraminal narrowing at C3-4, C5-6. Left-sided disease is more significant at C6-7 and C2-3. Upper chest: Lung apices are clear.   Thoracic inlet is normal. IMPRESSION: 1. Hemorrhagic contusion of the right temporal tip with adjacent subarachnoid hemorrhage. 2. Intraventricular hemorrhage layering in the posterior horns bilaterally. 3. Associated edema contributes to effacement of the sulci in the anterior right temporal lobe. No significant midline shift. 4. Stable atrophy and white matter disease. 5. 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 6. No acute fracture or traumatic subluxation in the cervical spine. 7. Multilevel degenerative changes of the cervical spine as described. Electronically Signed   By: San Morelle M.D.   On: 07/03/2019 11:41   DG Chest Portable 1 View  Result Date: 07/03/2019 CLINICAL DATA:  Fall down stairs. EXAM: PORTABLE CHEST 1 VIEW COMPARISON:  April 06, 2019 FINDINGS: Skin folds are identified over the upper lateral right chest. No pneumothorax. The heart, hila, mediastinum, and pleura are normal. No fractures are identified. IMPRESSION: No acute abnormalities. Electronically Signed   By: Dorise Bullion III M.D   On: 07/03/2019 11:31   DG HIP UNILAT WITH PELVIS 2-3 VIEWS RIGHT  Result Date: 07/03/2019 CLINICAL DATA:  Right hip pain after fall EXAM: DG HIP (WITH OR WITHOUT PELVIS) 2-3V RIGHT COMPARISON:  06/16/2017 FINDINGS: Postsurgical changes from prior right hip ORIF with IM nail  and screw fixation. No perihardware lucency or fracture. Similar degree of heterotopic ossification adjacent to the lesser trochanter and superior to the greater trochanter. Hip joint is intact without dislocation. Pelvic bony ring is intact. Vascular calcifications are noted. IMPRESSION: 1. No acute osseous abnormality, right hip. 2. Status post right hip ORIF without evidence of hardware complication. Electronically Signed   By: Davina Poke D.O.   On: 07/03/2019 11:34    Assessment/Plan: 64 year old female presented to [facility], ER this morning after a fall yesterday and altered mental status.   No one has been able to reach the family for further information.  Therefore, obtaining history is very difficult.  CT scan shows a right temporal hemorrhagic contusion with subarachnoid hemorrhage and intraventricular hemorrhage into the posterior horns bilaterally.  No midline shift or mass-effect.  We will admit her to ICU.  Appreciate the help of hospitalists given her extensive medical history.  There is no neurosurgical intervention needed at this time.  Please do not place her on any blood thinners.  We will repeat her head CT in the morning.   Ocie Cornfield [facility] 07/03/2019 4:37 PM

## 2019-07-03 NOTE — ED Triage Notes (Signed)
Pt fell yesterday. She has been altered ever since. Stroke scale is negative. Pt has pinpoint pupils per EMS but denies any narcotic use.

## 2019-07-04 ENCOUNTER — Observation Stay (HOSPITAL_COMMUNITY): Payer: Medicare Other

## 2019-07-04 DIAGNOSIS — W109XXA Fall (on) (from) unspecified stairs and steps, initial encounter: Secondary | ICD-10-CM | POA: Diagnosis present

## 2019-07-04 DIAGNOSIS — R4182 Altered mental status, unspecified: Secondary | ICD-10-CM | POA: Diagnosis present

## 2019-07-04 DIAGNOSIS — S06369A Traumatic hemorrhage of cerebrum, unspecified, with loss of consciousness of unspecified duration, initial encounter: Secondary | ICD-10-CM | POA: Diagnosis present

## 2019-07-04 DIAGNOSIS — Z79891 Long term (current) use of opiate analgesic: Secondary | ICD-10-CM | POA: Diagnosis not present

## 2019-07-04 DIAGNOSIS — I619 Nontraumatic intracerebral hemorrhage, unspecified: Secondary | ICD-10-CM | POA: Diagnosis present

## 2019-07-04 DIAGNOSIS — S066X9A Traumatic subarachnoid hemorrhage with loss of consciousness of unspecified duration, initial encounter: Secondary | ICD-10-CM | POA: Diagnosis present

## 2019-07-04 DIAGNOSIS — E0590 Thyrotoxicosis, unspecified without thyrotoxic crisis or storm: Secondary | ICD-10-CM | POA: Diagnosis present

## 2019-07-04 DIAGNOSIS — F17200 Nicotine dependence, unspecified, uncomplicated: Secondary | ICD-10-CM

## 2019-07-04 DIAGNOSIS — R4701 Aphasia: Secondary | ICD-10-CM | POA: Diagnosis present

## 2019-07-04 DIAGNOSIS — G8929 Other chronic pain: Secondary | ICD-10-CM | POA: Diagnosis present

## 2019-07-04 DIAGNOSIS — Z8673 Personal history of transient ischemic attack (TIA), and cerebral infarction without residual deficits: Secondary | ICD-10-CM | POA: Diagnosis not present

## 2019-07-04 DIAGNOSIS — F17210 Nicotine dependence, cigarettes, uncomplicated: Secondary | ICD-10-CM | POA: Diagnosis present

## 2019-07-04 DIAGNOSIS — D696 Thrombocytopenia, unspecified: Secondary | ICD-10-CM | POA: Diagnosis present

## 2019-07-04 DIAGNOSIS — F319 Bipolar disorder, unspecified: Secondary | ICD-10-CM | POA: Diagnosis present

## 2019-07-04 DIAGNOSIS — F419 Anxiety disorder, unspecified: Secondary | ICD-10-CM | POA: Diagnosis present

## 2019-07-04 DIAGNOSIS — Z79899 Other long term (current) drug therapy: Secondary | ICD-10-CM | POA: Diagnosis not present

## 2019-07-04 DIAGNOSIS — K219 Gastro-esophageal reflux disease without esophagitis: Secondary | ICD-10-CM | POA: Diagnosis present

## 2019-07-04 DIAGNOSIS — E876 Hypokalemia: Secondary | ICD-10-CM | POA: Diagnosis not present

## 2019-07-04 DIAGNOSIS — Z20822 Contact with and (suspected) exposure to covid-19: Secondary | ICD-10-CM | POA: Diagnosis present

## 2019-07-04 DIAGNOSIS — R414 Neurologic neglect syndrome: Secondary | ICD-10-CM | POA: Diagnosis present

## 2019-07-04 DIAGNOSIS — S0636AA Traumatic hemorrhage of cerebrum, unspecified, with loss of consciousness status unknown, initial encounter: Secondary | ICD-10-CM

## 2019-07-04 DIAGNOSIS — W19XXXD Unspecified fall, subsequent encounter: Secondary | ICD-10-CM | POA: Diagnosis not present

## 2019-07-04 DIAGNOSIS — G709 Myoneural disorder, unspecified: Secondary | ICD-10-CM | POA: Diagnosis present

## 2019-07-04 DIAGNOSIS — E785 Hyperlipidemia, unspecified: Secondary | ICD-10-CM | POA: Diagnosis present

## 2019-07-04 DIAGNOSIS — Z7982 Long term (current) use of aspirin: Secondary | ICD-10-CM | POA: Diagnosis not present

## 2019-07-04 DIAGNOSIS — F0390 Unspecified dementia without behavioral disturbance: Secondary | ICD-10-CM | POA: Diagnosis present

## 2019-07-04 DIAGNOSIS — I629 Nontraumatic intracranial hemorrhage, unspecified: Secondary | ICD-10-CM | POA: Diagnosis not present

## 2019-07-04 HISTORY — DX: Traumatic hemorrhage of cerebrum, unspecified, with loss of consciousness status unknown, initial encounter: S06.36AA

## 2019-07-04 HISTORY — DX: Nontraumatic intracerebral hemorrhage, unspecified: I61.9

## 2019-07-04 MED ORDER — DIVALPROEX SODIUM 125 MG PO CSDR
250.0000 mg | DELAYED_RELEASE_CAPSULE | Freq: Four times a day (QID) | ORAL | Status: DC
  Administered 2019-07-04 – 2019-07-09 (×19): 250 mg via ORAL
  Filled 2019-07-04 (×19): qty 2

## 2019-07-04 MED ORDER — METOPROLOL TARTRATE 5 MG/5ML IV SOLN: 5.0000 mg | Freq: Once | INTRAVENOUS | Status: DC

## 2019-07-04 MED ORDER — ORAL CARE MOUTH RINSE
15.0000 mL | Freq: Two times a day (BID) | OROMUCOSAL | Status: DC
  Administered 2019-07-04 – 2019-07-09 (×12): 15 mL via OROMUCOSAL

## 2019-07-04 NOTE — Progress Notes (Signed)
PROGRESS NOTE    Cassidy Smith  SWN:[id number] DOB: 1955/02/24 DOA: 07/03/2019 PCP: Neale Burly, MD    Brief Narrative:  64 y.o. female with h/o SIADH; CVA; bipolar; and dementia who presented as a transfer from [place] after a fall with SAH/IVH  Assessment & Plan:   Principal Problem:   Closed head injury Active Problems:   Bipolar 1 disorder ([place])   Current every day smoker   Thrombocytopenia ([place])   Closed head injury -Patient with AMS after fall remotely -Imaging shows intraparenchymal hemorrhage in the right temporal lobe -neurosurgery following. Repeat CT head reviewed. Findings of expected evolution of R temporal hemorrhagic contusion with increasing surrounding edema. Local mass effect present without midline shift -Defer to Neurosurgery  Bipolar -Continue Depakote, Cogentin, Risperdal, Trazodone as tolerated  Tobacco dependence -Uncertain about recent use -Can consider addition of nicotine patch  Thrombocytopenia -Chronic, stable  HLD -Continue Lipitor  Hyperthyroidism -Continue methimazole -TSH 0.805 with free T4 of 0.69  DVT prophylaxis: SCD's Code Status: Full Family Communication: Pt in room, family not at bedside    Consultants:     Procedures:     Antimicrobials: Anti-infectives (From admission, onward)   None       Subjective: Awake, without complaints. Pleasantly confused and able to say she is in [place]  Objective: Vitals:   07/04/19 1200 07/04/19 1205 07/04/19 1300 07/04/19 1400  BP:  135/84 124/88 133/74  Pulse: 80 85 80 78  Resp: (!) 21 (!) 25 17 19   Temp: 98.8 F (37.1 C)     TempSrc: Axillary     SpO2: 92% 95% 95% 92%  Weight:      Height:        Intake/Output Summary (Last 24 hours) at 07/04/2019 1446 Last data filed at 07/04/2019 1400 Gross per 24 hour  Intake 501.85 ml  Output --  Net 501.85 ml   Filed Weights   07/03/19 1036 07/04/19 0125  Weight: 54.4 kg 47.3 kg     Examination:  General exam: Appears calm and comfortable  Respiratory system: Clear to auscultation. Respiratory effort normal. Cardiovascular system: S1 & S2 heard, Regular Gastrointestinal system: Abdomen is nondistended, soft and nontender. No organomegaly or masses felt. Normal bowel sounds heard. Central nervous system: Alert, pleasantly confused. No focal neurological deficits. Extremities: Symmetric 5 x 5 power. Skin: No rashes, lesions  Psychiatry: Judgement and insight appear normal. Mood & affect appropriate.   Data Reviewed: I have personally reviewed following labs and imaging studies  CBC: Recent Labs  Lab 07/03/19 1106  WBC 8.0  NEUTROABS 5.1  HGB 12.3  HCT 37.5  MCV 96.2  PLT 88*   Basic Metabolic Panel: Recent Labs  Lab 07/03/19 1106  NA 137  K 3.4*  CL 99  CO2 29  GLUCOSE 100*  BUN 21  CREATININE 0.59  CALCIUM 9.8   GFR: Estimated Creatinine Clearance: 51.7 mL/min (by C-G formula based on SCr of 0.59 mg/dL). Liver Function Tests: No results for input(s): AST, ALT, ALKPHOS, BILITOT, PROT, ALBUMIN in the last 168 hours. No results for input(s): LIPASE, AMYLASE in the last 168 hours. No results for input(s): AMMONIA in the last 168 hours. Coagulation Profile: No results for input(s): INR, PROTIME in the last 168 hours. Cardiac Enzymes: No results for input(s): CKTOTAL, CKMB, CKMBINDEX, TROPONINI in the last 168 hours. BNP (last 3 results) No results for input(s): PROBNP in the last 8760 hours. HbA1C: No results for input(s): HGBA1C in the last 72 hours. CBG: No  results for input(s): GLUCAP in the last 168 hours. Lipid Profile: No results for input(s): CHOL, HDL, LDLCALC, TRIG, CHOLHDL, LDLDIRECT in the last 72 hours. Thyroid Function Tests: Recent Labs    07/03/19 2131  TSH 0.805  FREET4 0.69   Anemia Panel: No results for input(s): VITAMINB12, FOLATE, FERRITIN, TIBC, IRON, RETICCTPCT in the last 72 hours. Sepsis Labs: No results  for input(s): PROCALCITON, LATICACIDVEN in the last 168 hours.  Recent Results (from the past 240 hour(s))  SARS Coronavirus 2 by RT PCR (hospital order, performed in [facility] hospital lab) Nasopharyngeal Nasopharyngeal Swab     Status: None   Collection Time: 07/03/19 10:41 AM   Specimen: Nasopharyngeal Swab  Result Value Ref Range Status   SARS Coronavirus 2 NEGATIVE NEGATIVE Final    Comment: (NOTE) SARS-CoV-2 target nucleic acids are NOT DETECTED.  The SARS-CoV-2 RNA is generally detectable in upper and lower respiratory specimens during the acute phase of infection. The lowest concentration of SARS-CoV-2 viral copies this assay can detect is 250 copies / mL. A negative result does not preclude SARS-CoV-2 infection and should not be used as the sole basis for treatment or other patient management decisions.  A negative result may occur with improper specimen collection / handling, submission of specimen other than nasopharyngeal swab, presence of viral mutation(s) within the areas targeted by this assay, and inadequate number of viral copies (<250 copies / mL). A negative result must be combined with clinical observations, patient history, and epidemiological information.  Fact Sheet for Patients:   StrictlyIdeas.no  Fact Sheet for Healthcare Providers: BankingDealers.co.za  This test is not yet approved or  cleared by the [place] FDA and has been authorized for detection and/or diagnosis of SARS-CoV-2 by FDA under an Emergency Use Authorization (EUA).  This EUA will remain in effect (meaning this test can be used) for the duration of the COVID-19 declaration under Section 564(b)(1) of the Act, 21 U.S.C. section 360bbb-3(b)(1), unless the authorization is terminated or revoked sooner.  Performed at [facility], [address], [place], [place] [address]   MRSA PCR Screening     Status: None   Collection Time: 07/03/19   9:57 PM   Specimen: Nasal Mucosa; Nasopharyngeal  Result Value Ref Range Status   MRSA by PCR NEGATIVE NEGATIVE Final    Comment:        The GeneXpert MRSA Assay (FDA approved for NASAL specimens only), is one component of a comprehensive MRSA colonization surveillance program. It is not intended to diagnose MRSA infection nor to guide or monitor treatment for MRSA infections. Performed at [facility], [place] [address] [place],  [address]      Radiology Studies: CT HEAD WO CONTRAST  Result Date: 07/04/2019 CLINICAL DATA:  Head trauma.  Intracranial hemorrhage. EXAM: CT HEAD WITHOUT CONTRAST TECHNIQUE: Contiguous axial images were obtained from the base of the skull through the vertex without intravenous contrast. COMPARISON:  CT head without contrast 07/03/2019. FINDINGS: Brain: Right temporal hemorrhagic contusion demonstrates expected evolution. Increasing surrounding edema is present. Effacement of the sulci is again noted. Scattered areas of subarachnoid hemorrhage again noted bilaterally, right greater than left. Blood is noted in the posterior horns of the lateral ventricles bilaterally. Ventricular enlargement is stable. Moderate white matter disease is unchanged. 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 Vascular: Vascular calcifications are evident. Skull: Calvarium is intact. No focal lytic or blastic lesions are present. No significant extracranial soft tissue lesion is present. Sinuses/Orbits:  The paranasal sinuses and mastoid air cells are clear. The globes and orbits are within normal limits. IMPRESSION: 1. Expected evolution of right temporal hemorrhagic contusion with increasing surrounding edema. Local mass effect is present without midline shift. 2. Stable scattered areas of subarachnoid hemorrhage bilaterally, right greater than left. These are more visible on today's study, felt to be due to technical factors without new or progressive hemorrhage.  3. Stable ventriculomegaly.  Stable intraventricular hemorrhage. 4. Stable white matter disease. 5. Remote infarcts of the right cerebellum. Electronically Signed   By: San Morelle M.D.   On: 07/04/2019 12:00   CT Head Wo Contrast  Result Date: 07/03/2019 CLINICAL DATA:  Polytrauma, critical. Fall yesterday with altered mental status since. EXAM: CT HEAD WITHOUT CONTRAST CT CERVICAL SPINE WITHOUT CONTRAST TECHNIQUE: Multidetector CT imaging of the head and cervical spine was performed following the standard protocol without intravenous contrast. Multiplanar CT image reconstructions of the cervical spine were also generated. COMPARISON:  CT head without contrast 08/14/2017. MR head without contrast 08/15/2017. MR cervical spine 06/11/2017. FINDINGS: CT HEAD FINDINGS Brain: Hemorrhagic contusion is noted in the right temporal tip. Adjacent subarachnoid hemorrhage is present. Intraventricular hemorrhage is seen layering in the posterior horns bilaterally. Associated edema contributes to effacement of the sulci in the anterior right temporal lobe. No significant midline shift is present. Ventricles are chronically dilated. No acute change or hydrocephalus is present. Periventricular white matter changes are stable. No ischemic infarcts are evident. 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 Vascular: Atherosclerotic calcifications are present within the cavernous internal carotid arteries. No hyperdense vessel is present. Skull: No acute fracture or extracranial soft tissue injury is evident. Sinuses/Orbits: The paranasal sinuses and mastoid air cells are clear. The globes and orbits are within normal limits. CT CERVICAL SPINE FINDINGS Alignment: Minimal degenerative anterolisthesis is present at C3-4 and C4-5. Skull base and vertebrae: Craniocervical junction is normal. Vertebral body heights are maintained. No acute or healing fractures are present. Soft tissues and spinal canal:  Atherosclerotic calcifications are present at the carotid bifurcations bilaterally, right greater than left. Soft tissues of the neck are otherwise unremarkable. No prevertebral fluid or swelling. No visible canal hematoma. No mass lesion or adenopathy. Disc levels: Uncovertebral and facet disease contribute to right greater than left foraminal narrowing at C3-4, C5-6. Left-sided disease is more significant at C6-7 and C2-3. Upper chest: Lung apices are clear.  Thoracic inlet is normal. IMPRESSION: 1. Hemorrhagic contusion of the right temporal tip with adjacent subarachnoid hemorrhage. 2. Intraventricular hemorrhage layering in the posterior horns bilaterally. 3. Associated edema contributes to effacement of the sulci in the anterior right temporal lobe. No significant midline shift. 4. Stable atrophy and white matter disease. 5. 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 6. No acute fracture or traumatic subluxation in the cervical spine. 7. Multilevel degenerative changes of the cervical spine as described. Electronically Signed   By: San Morelle M.D.   On: 07/03/2019 11:41   CT Cervical Spine Wo Contrast  Result Date: 07/03/2019 CLINICAL DATA:  Polytrauma, critical. Fall yesterday with altered mental status since. EXAM: CT HEAD WITHOUT CONTRAST CT CERVICAL SPINE WITHOUT CONTRAST TECHNIQUE: Multidetector CT imaging of the head and cervical spine was performed following the standard protocol without intravenous contrast. Multiplanar CT image reconstructions of the cervical spine were also generated. COMPARISON:  CT head without contrast 08/14/2017. MR head without contrast 08/15/2017. MR cervical spine 06/11/2017. FINDINGS: CT HEAD FINDINGS Brain: Hemorrhagic contusion is noted in the right  temporal tip. Adjacent subarachnoid hemorrhage is present. Intraventricular hemorrhage is seen layering in the posterior horns bilaterally. Associated edema contributes to effacement of the sulci in the  anterior right temporal lobe. No significant midline shift is present. Ventricles are chronically dilated. No acute change or hydrocephalus is present. Periventricular white matter changes are stable. No ischemic infarcts are evident. 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 Vascular: Atherosclerotic calcifications are present within the cavernous internal carotid arteries. No hyperdense vessel is present. Skull: No acute fracture or extracranial soft tissue injury is evident. Sinuses/Orbits: The paranasal sinuses and mastoid air cells are clear. The globes and orbits are within normal limits. CT CERVICAL SPINE FINDINGS Alignment: Minimal degenerative anterolisthesis is present at C3-4 and C4-5. Skull base and vertebrae: Craniocervical junction is normal. Vertebral body heights are maintained. No acute or healing fractures are present. Soft tissues and spinal canal: Atherosclerotic calcifications are present at the carotid bifurcations bilaterally, right greater than left. Soft tissues of the neck are otherwise unremarkable. No prevertebral fluid or swelling. No visible canal hematoma. No mass lesion or adenopathy. Disc levels: Uncovertebral and facet disease contribute to right greater than left foraminal narrowing at C3-4, C5-6. Left-sided disease is more significant at C6-7 and C2-3. Upper chest: Lung apices are clear.  Thoracic inlet is normal. IMPRESSION: 1. Hemorrhagic contusion of the right temporal tip with adjacent subarachnoid hemorrhage. 2. Intraventricular hemorrhage layering in the posterior horns bilaterally. 3. Associated edema contributes to effacement of the sulci in the anterior right temporal lobe. No significant midline shift. 4. Stable atrophy and white matter disease. 5. 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 6. No acute fracture or traumatic subluxation in the cervical spine. 7. Multilevel degenerative changes of the cervical spine as described. Electronically  Signed   By: San Morelle M.D.   On: 07/03/2019 11:41   DG Chest Portable 1 View  Result Date: 07/03/2019 CLINICAL DATA:  Fall down stairs. EXAM: PORTABLE CHEST 1 VIEW COMPARISON:  April 06, 2019 FINDINGS: Skin folds are identified over the upper lateral right chest. No pneumothorax. The heart, hila, mediastinum, and pleura are normal. No fractures are identified. IMPRESSION: No acute abnormalities. Electronically Signed   By: Dorise Bullion III M.D   On: 07/03/2019 11:31   DG HIP UNILAT WITH PELVIS 2-3 VIEWS RIGHT  Result Date: 07/03/2019 CLINICAL DATA:  Right hip pain after fall EXAM: DG HIP (WITH OR WITHOUT PELVIS) 2-3V RIGHT COMPARISON:  06/16/2017 FINDINGS: Postsurgical changes from prior right hip ORIF with IM nail and screw fixation. No perihardware lucency or fracture. Similar degree of heterotopic ossification adjacent to the lesser trochanter and superior to the greater trochanter. Hip joint is intact without dislocation. Pelvic bony ring is intact. Vascular calcifications are noted. IMPRESSION: 1. No acute osseous abnormality, right hip. 2. Status post right hip ORIF without evidence of hardware complication. Electronically Signed   By: Davina Poke D.O.   On: 07/03/2019 11:34    Scheduled Meds: . atorvastatin  20 mg Oral q1800  . benztropine  1 mg Oral BID  . Chlorhexidine Gluconate Cloth  6 each Topical Daily  . cholecalciferol  1,000 Units Oral Daily  . divalproex  1,000 mg Oral QHS  . docusate sodium  100 mg Oral BID  . furosemide  10 mg Oral QODAY  . gabapentin  100 mg Oral TID  . mouth rinse  15 mL Mouth Rinse BID  . methimazole  10 mg Oral QODAY  . metoprolol tartrate  5  mg Intravenous Once  . multivitamin with minerals  1 tablet Oral Daily  . pantoprazole  40 mg Oral Daily  . potassium chloride  10 mEq Oral QODAY  . risperiDONE  2 mg Oral QHS  . senna  1 tablet Oral BID  . traZODone  100 mg Oral QHS  . vitamin B-12  1,000 mcg Oral Daily   Continuous  Infusions: . sodium chloride Stopped (07/04/19 0806)     LOS: 0 days   Marylu Lund, MD [facility] Pager On Amion  If 7PM-7AM, please contact night-coverage 07/04/2019, 2:46 PM

## 2019-07-04 NOTE — Progress Notes (Signed)
Patient ID: Cassidy Smith, female   DOB: 04-23-1955, 64 y.o.   MRN: [id number] Head CT reviewed, no change,  expected evolution of hematomas. [place] for transfer

## 2019-07-04 NOTE — Evaluation (Signed)
Clinical/Bedside Swallow Evaluation Patient Details  Name: Cassidy Smith MRN: [id number] Date of Birth: Dec 22, 1955  Today's Date: 07/04/2019 Time: SLP Start Time (ACUTE ONLY): 1220 SLP Stop Time (ACUTE ONLY): 1245 SLP Time Calculation (min) (ACUTE ONLY): 25 min  Past Medical History:  Past Medical History:  Diagnosis Date  . Anxiety   . Bipolar 1 disorder ([place])   . Chronic hip pain   . Chronic kidney disease    uti  currently,  hx bladder spasms  . Depression   . Headache(784.0)   . Hyperthyroidism   . Ischemic stroke ([place])   . Neuromuscular disorder ([facility])    shaking of hands   . SIADH (syndrome of inappropriate ADH production) ([place]) 04/08/2017   Past Surgical History:  Past Surgical History:  Procedure Laterality Date  . COLONOSCOPY WITH PROPOFOL N/A 04/05/2017   Procedure: COLONOSCOPY WITH PROPOFOL;  Surgeon: Rogene Houston, MD;  [facility];  Service: Endoscopy;  Laterality: N/A;  . INTRAMEDULLARY (IM) NAIL INTERTROCHANTERIC Right 03/07/2017   Procedure: OPEN TREATMENT INTERNAL FIXATION RIGHT HIP WITH GAMA INTRAMEDULARY NAIL;  Surgeon: Carole Civil, MD;  Location: [facility];  Service: Orthopedics;  Laterality: Right;  . MULTIPLE EXTRACTIONS WITH ALVEOLOPLASTY N/A 10/30/2012   Procedure: MULTIPLE EXTRACION 5, 6, 8, 9, 10 ,18, 19, 31 WITH MAXILLARY RIGHT AND LEFT  ALVEOLOPLASTY REDUCE MAXILLARY LEFT TUBEROSITY;  Surgeon: Gae Bon, DDS;  Location: [facility];  Service: Oral Surgery;  Laterality: N/A;  . POLYPECTOMY  04/05/2017   Procedure: POLYPECTOMY;  Surgeon: Rogene Houston, MD;  Location: [facility];  Service: Endoscopy;;  recto-sigmoid, rectum   HPI:  Cassidy Smith is an 64 y.o. female with h/o  CVA; bipolar; and dementia who presented as a transfer from [place] after a fall with SAH/IVH.     Assessment / Plan / Recommendation Clinical Impression   Pt presents with a primarily cognitively based dysphagia characterized by decreased attention to boluses  which leads to prolonged oral transit and oral holding of both solids and liquids.  Pt had intermittent immediate coughing with mixed solids and liquids which SLP suspects to be related to the deficits mentioned above.  No coughing was noted with solids or thin liquids in isolation.  Give pt's prolonged oral phase and attention deficits, I would suggest downgrading pt's diet to dys 2 textures and thin liquids to facilitate improved efficiency with intake and maximize pt's potential for meeting nutritional and hydration needs orally.  Spoke with RN about pt's overt cognitive deficits and she was in agreement with ordering a cognitive-linguistic evaluation to assist in care planning.  SLP will follow up for dysphagia treatment and cognitive-linguistic evaluation at next available appointment.    SLP Visit Diagnosis: Dysphagia, unspecified (R13.10)    Aspiration Risk  Mild aspiration risk    Diet Recommendation Dysphagia 2 (Fine chop);Thin liquid   Liquid Administration via: Cup;Straw Medication Administration: Whole meds with liquid Supervision: Full supervision/cueing for compensatory strategies Compensations: Minimize environmental distractions;Slow rate;Small sips/bites Postural Changes: Seated upright at 90 degrees    Other  Recommendations Oral Care Recommendations: Oral care BID   Follow up Recommendations Skilled Nursing facility      Frequency and Duration min 3x week          Prognosis Prognosis for Safe Diet Advancement: Good      Swallow Study   General HPI: Cassidy Smith is an 64 y.o. female with h/o  CVA; bipolar; and dementia who presented as a  transfer from [facility] after a fall with SAH/IVH.   Type of Study: Bedside Swallow Evaluation Previous Swallow Assessment: 2015 Diet Prior to this Study: Regular;Thin liquids Temperature Spikes Noted: No Respiratory Status: Room air History of Recent Intubation: No Behavior/Cognition:  Alert;Cooperative;Confused;Distractible;Requires cueing Oral Cavity - Dentition: Edentulous Vision: Functional for self-feeding Self-Feeding Abilities: Able to feed self;Needs assist Patient Positioning: Upright in bed Baseline Vocal Quality: Hoarse Volitional Cough: Weak Volitional Swallow: Able to elicit    Oral/Motor/Sensory Function Overall Oral Motor/Sensory Function: Within functional limits   Ice Chips     Thin Liquid Thin Liquid: Impaired Presentation: Straw Pharyngeal  Phase Impairments: Cough - Delayed (with mixed consistencies)    Nectar Thick     Honey Thick     Puree     Solid     Solid: Impaired Presentation: Self Fed Oral Phase Impairments: Poor awareness of bolus;Impaired mastication Oral Phase Functional Implications: Impaired mastication;Prolonged oral transit;Oral holding      Kingstin Heims, Elmyra Ricks L 07/04/2019,1:12 PM

## 2019-07-04 NOTE — Plan of Care (Signed)
  Problem: Coping: Goal: Will identify appropriate support needs Outcome: Progressing

## 2019-07-04 NOTE — Progress Notes (Signed)
Patient has been given PRN medications for SBP>140 per orders without any change in meeting the BP goal. Dr Claria Dice paged again and a 1 time order for 5mg  of Lopressor given. Will continue to monitor.

## 2019-07-04 NOTE — Progress Notes (Signed)
Patient ID: Cassidy Smith, female   DOB: Jun 08, 1955, 64 y.o.   MRN: [id number] BP 131/66   Pulse 74   Temp 100.2 F (37.9 C) (Oral)   Resp 18   Ht 5\' 6"  (1.676 m)   Wt 47.3 kg   SpO2 92%   BMI 16.83 kg/m  Alert, following commands Pupils pinpoint, reactive to light Symmetric facial movements Moving all extremities Will repeat head ct, possible transfer

## 2019-07-05 LAB — COMPREHENSIVE METABOLIC PANEL
ALT: 9 U/L (ref 0–44)
AST: 21 U/L (ref 15–41)
Albumin: 2.6 g/dL — ABNORMAL LOW (ref 3.5–5.0)
Alkaline Phosphatase: 66 U/L (ref 38–126)
Anion gap: 7 (ref 5–15)
BUN: 22 mg/dL (ref 8–23)
CO2: 27 mmol/L (ref 22–32)
Calcium: 9.4 mg/dL (ref 8.9–10.3)
Chloride: 98 mmol/L (ref 98–111)
Creatinine, Ser: 0.51 mg/dL (ref 0.44–1.00)
GFR calc Af Amer: >60 mL/min (ref >60)
GFR calc non Af Amer: >60 mL/min (ref >60)
Glucose, Bld: 107 mg/dL — ABNORMAL HIGH (ref 70–99)
Potassium: 3.9 mmol/L (ref 3.5–5.1)
Sodium: 132 mmol/L — ABNORMAL LOW (ref 135–145)
Total Bilirubin: 0.7 mg/dL (ref 0.3–1.2)
Total Protein: 5.9 g/dL — ABNORMAL LOW (ref 6.5–8.1)

## 2019-07-05 NOTE — Evaluation (Signed)
Physical Therapy Evaluation Patient Details Name: Cassidy Smith MRN: [id number] DOB: January 24, 1955 Today's Date: 07/05/2019   History of Present Illness  64 year old female presented to [facility], ER this morning after a fall yesterday and altered mental status. CT scan shows a right temporal hemorrhagic contusion with subarachnoid hemorrhage and intraventricular hemorrhage into the posterior horns bilaterally.  No midline shift or mass-effect. PHMx: Bipolar, TIA, dementia    Clinical Impression  Pt admitted with above. Per RN pt was living alone. Pt with both impaired cognition and mobility. Pt at a very high falls risk and unable to take care of self. Pt with decreased insight to safety and deficits. Pt not oriented. Pt unsafe to return home alone and will need SNF or memory care unit that can manage moderate level of assist. Acute PT to cont to follow.    Follow Up Recommendations SNF;Supervision/Assistance - 24 hour    Equipment Recommendations  None recommended by PT    Recommendations for Other Services       Precautions / Restrictions Precautions Precautions: Fall Restrictions Weight Bearing Restrictions: No      Mobility  Bed Mobility               General bed mobility comments: pt up in chair upon arrival  Transfers Overall transfer level: Needs assistance Equipment used: Rolling walker (2 wheeled) Transfers: Sit to/from Stand Sit to Stand: Mod assist         General transfer comment: max tactile cues to power up, no initiation, pt with posterior bias, very rigid with feet very close together  Ambulation/Gait Ambulation/Gait assistance: Mod assist;+2 physical assistance;+2 safety/equipment Gait Distance (Feet): 10 Feet (x2, to/from bathroom) Assistive device: Rolling walker (2 wheeled) Gait Pattern/deviations: Step-to pattern;Decreased stride length;Decreased weight shift to right;Shuffle;Festinating;Trunk flexed;Narrow base of support Gait velocity:  slow Gait velocity interpretation: <1.8 ft/sec, indicate of risk for recurrent falls General Gait Details: pt presenting with parkinsonia type gait pattern. Pt with feet very close together, as in touching each other, pt with noted R LE weakness and not clearing foot or stepping as long as L LE, even with increased stability and facilitation of R LE pt resistant, regid and reaching for wall, counter, and edge of bed  Stairs            Wheelchair Mobility    Modified Rankin (Stroke Patients Only)       Balance Overall balance assessment: Needs assistance Sitting-balance support: Feet supported;Bilateral upper extremity supported Sitting balance-Leahy Scale: Fair   Postural control: Posterior lean Standing balance support: Bilateral upper extremity supported Standing balance-Leahy Scale: Poor Standing balance comment: dependent on bilat UE support                             Pertinent Vitals/Pain Pain Assessment: No/denies pain    Home Living Family/patient expects to be discharged to:: Skilled nursing facility Living Arrangements: Alone               Additional Comments: pt lives alone per RN, per previous admission in 12/2017, pts brother was living with her and had a HHAide however it appears that is no longer the case    Prior Function Level of Independence: Needs assistance   Gait / Transfers Assistance Needed: pt poor historian but suspect pt was using and AD  ADL's / Homemaking Assistance Needed: pt poor historian, pt reports she lives alone and doesnt have an aide, pt unable to  report who does laundry and cooking        Hand Dominance   Dominant Hand: Right    Extremity/Trunk Assessment   Upper Extremity Assessment Upper Extremity Assessment: Defer to OT evaluation    Lower Extremity Assessment Lower Extremity Assessment: Generalized weakness (R LE appears weaker than L during amb)    Cervical / Trunk Assessment Cervical / Trunk  Assessment: Kyphotic  Communication   Communication: No difficulties (delayed response time)  Cognition Arousal/Alertness: Awake/alert Behavior During Therapy: Flat affect Overall Cognitive Status: Impaired/Different from baseline (also with history of dementia and Bipolar 1) Area of Impairment: Orientation;Attention;Memory;Following commands;Safety/judgement;Awareness;Problem solving                 Orientation Level: Disoriented to;Place;Time;Situation Current Attention Level: Focused Memory: Decreased short-term memory Following Commands: Follows one step commands with increased time;Follows one step commands inconsistently Safety/Judgement: Decreased awareness of safety;Decreased awareness of deficits Awareness: Intellectual Problem Solving: Slow processing;Decreased initiation;Difficulty sequencing;Requires verbal cues;Requires tactile cues General Comments: per RN, according to family pt has only been oriented to her self for awhile      General Comments General comments (skin integrity, edema, etc.): frail, crepe skin    Exercises     Assessment/Plan    PT Assessment Patient needs continued PT services  PT Problem List Decreased range of motion;Decreased activity tolerance;Decreased strength;Decreased balance;Decreased mobility;Decreased coordination;Decreased cognition;Decreased knowledge of use of DME;Decreased safety awareness       PT Treatment Interventions DME instruction;Gait training;Stair training;Functional mobility training;Therapeutic activities;Therapeutic exercise;Balance training;Neuromuscular re-education;Cognitive remediation;Patient/family education    PT Goals (Current goals can be found in the Care Plan section)  Acute Rehab PT Goals PT Goal Formulation: Patient unable to participate in goal setting Time For Goal Achievement: 07/19/19 Potential to Achieve Goals: Fair    Frequency Min 2X/week   Barriers to discharge Decreased caregiver  support pt lives alone    Co-evaluation PT/OT/SLP Co-Evaluation/Treatment: Yes Reason for Co-Treatment: For patient/therapist safety PT goals addressed during session: Mobility/safety with mobility         AM-PAC PT "6 Clicks" Mobility  Outcome Measure Help needed turning from your back to your side while in a flat bed without using bedrails?: A Lot Help needed moving from lying on your back to sitting on the side of a flat bed without using bedrails?: A Lot Help needed moving to and from a bed to a chair (including a wheelchair)?: A Lot Help needed standing up from a chair using your arms (e.g., wheelchair or bedside chair)?: A Lot Help needed to walk in hospital room?: A Lot Help needed climbing 3-5 steps with a railing? : Total 6 Click Score: 11    End of Session Equipment Utilized During Treatment: Gait belt Activity Tolerance: Patient tolerated treatment well Patient left: in chair;with chair alarm set;with nursing/sitter in room Nurse Communication: Mobility status PT Visit Diagnosis: Difficulty in walking, not elsewhere classified (R26.2)    Time: 1305-1330 PT Time Calculation (min) (ACUTE ONLY): 25 min   Charges:   PT Evaluation $PT Eval Moderate Complexity: 1 Mod          Kittie Plater, PT, DPT Acute Rehabilitation Services Pager #: [phone] Office #: [phone]   Berline Lopes 07/05/2019, 1:59 PM

## 2019-07-05 NOTE — Evaluation (Signed)
Speech Language Pathology Evaluation Patient Details Name: Cassidy Smith MRN: [id number] DOB: May 22, 1955 Today's Date: 07/05/2019 Time: 8366-2947 SLP Time Calculation (min) (ACUTE ONLY): 20 min  Problem List:  Patient Active Problem List   Diagnosis Date Noted  . Intracerebral hematoma ([place]) 07/04/2019  . Closed head injury 07/03/2019  . Thrombocytopenia ([place]) 07/03/2019  . UTI (urinary tract infection) 01/04/2018  . Neuromuscular disorder ([place])   . Dysuria 10/30/2017  . Fever 08/15/2017  . Hypokalemia 08/15/2017  . Altered mental state 08/15/2017  . Acute metabolic encephalopathy 65/46/5035  . Valproic acid toxicity 08/15/2017  . Altered mental status   . Ischemic stroke ([place])   . GERD (gastroesophageal reflux disease) 05/12/2017  . GI bleed   . 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 Dysplastic rectal polyp   . Protein-calorie malnutrition, severe 04/07/2017  . Pressure injury of skin 04/04/2017  . Anticoagulated 04/03/2017  . Stool bloody 04/03/2017  . Current every day smoker 04/03/2017  . Rectal bleeding   . Chronic diarrhea   . Hyperthyroidism   . S/P ORIF (open reduction internal fixation) fracture right hip IM nail 03/07/17 03/24/2017  . Hypomagnesemia 03/07/2017  . Fall   . Closed intertrochanteric fracture of hip, right, initial encounter ([place]) 03/06/2017  . Radial styloid fracture: right 03/06/2017  . Anemia 03/06/2017  . Orthostatic hypotension 06/07/2015  . Lower urinary tract infectious disease 06/07/2015  . Rhabdomyolysis 06/07/2015  . White matter abnormality on MRI of brain 05/14/2013  . History of depression 05/14/2013  . Hx of anxiety disorder 05/14/2013  . Bipolar I disorder, most recent episode (or current) manic, unspecified 05/14/2013  . Bipolar 1 disorder ([place]) 05/14/2013  . Hyponatremia 05/12/2013  . Bipolar disorder ([place]) 05/12/2013  . Lacunar infarct, acute ([place]) 05/12/2013   Past Medical History:  Past  Medical History:  Diagnosis Date  . Anxiety   . Bipolar 1 disorder ([place])   . Chronic hip pain   . Chronic kidney disease    uti  currently,  hx bladder spasms  . Depression   . Headache(784.0)   . Hyperthyroidism   . Ischemic stroke ([place])   . Neuromuscular disorder ([facility])    shaking of hands   . SIADH (syndrome of inappropriate ADH production) ([place]) 04/08/2017   Past Surgical History:  Past Surgical History:  Procedure Laterality Date  . COLONOSCOPY WITH PROPOFOL N/A 04/05/2017   Procedure: COLONOSCOPY WITH PROPOFOL;  Surgeon: Rogene Houston, MD;  [facility];  Service: Endoscopy;  Laterality: N/A;  . INTRAMEDULLARY (IM) NAIL INTERTROCHANTERIC Right 03/07/2017   Procedure: OPEN TREATMENT INTERNAL FIXATION RIGHT HIP WITH GAMA INTRAMEDULARY NAIL;  Surgeon: Carole Civil, MD;  Location: [facility];  Service: Orthopedics;  Laterality: Right;  . MULTIPLE EXTRACTIONS WITH ALVEOLOPLASTY N/A 10/30/2012   Procedure: MULTIPLE EXTRACION 5, 6, 8, 9, 10 ,18, 19, 31 WITH MAXILLARY RIGHT AND LEFT  ALVEOLOPLASTY REDUCE MAXILLARY LEFT TUBEROSITY;  Surgeon: Gae Bon, DDS;  Location: [place];  Service: Oral Surgery;  Laterality: N/A;  . POLYPECTOMY  04/05/2017   Procedure: POLYPECTOMY;  Surgeon: Rogene Houston, MD;  Location: [facility];  Service: Endoscopy;;  recto-sigmoid, rectum   HPI:  Cassidy Smith is an 64 y.o. female with h/o  CVA; bipolar; and dementia who presented as a transfer from [place] after a fall with SAH/IVH.     Assessment / Plan / Recommendation Clinical Impression  Pt presents with cognitive deficits after falling down stairs and sustaining  CHI consistent with [place] Level VI (Confused/appropriate) - she was drowsy during assessment, and would arouse to voice, interact briefly, then fall back to sleep.  Demonstrates deficits in orientation to time/circumstances, inattention, difficulty shifting set from one task to the other with perseveratory responses and motor  patterns, and poor memory.  Speech is clear and fluent.  Pt with impaired awareness and confusion (e.g., she stated, "I wanted to pick up some spices at [employer] today.") Recommend acute SLP f/u to address aforementioned cognitive impairments. Pt will need 24 hour supervision at D/C.        SLP Assessment  SLP Recommendation/Assessment: Patient needs continued Speech Lanaguage Pathology Services SLP Visit Diagnosis: Attention and concentration deficit    Follow Up Recommendations  Skilled Nursing facility    Frequency and Duration min 2x/week  2 weeks      SLP Evaluation Cognition  Overall Cognitive Status: Impaired/Different from baseline Arousal/Alertness: Awake/alert Orientation Level: Oriented to person;Oriented to place;Disoriented to time;Disoriented to situation Attention: Sustained;Selective Sustained Attention: Impaired Sustained Attention Impairment: Verbal basic Selective Attention: Impaired Selective Attention Impairment: Verbal basic Memory: Impaired Memory Impairment: Storage deficit Awareness: Impaired Awareness Impairment: Intellectual impairment Problem Solving: Impaired Problem Solving Impairment: Verbal basic Executive Function: Self Monitoring;Reasoning;Initiating Reasoning: Impaired Reasoning Impairment: Verbal basic Initiating: Impaired Initiating Impairment: Verbal basic Self Monitoring: Impaired Behaviors: Perseveration Safety/Judgment: Impaired [place] [employer] Scales of Cognitive Functioning: Confused/appropriate       Comprehension  Auditory Comprehension Overall Auditory Comprehension: Impaired Yes/No Questions: Within Functional Limits Commands: Impaired Two Step Basic Commands: 50-74% accurate Conversation: Simple Reading Comprehension Reading Status: Not tested    Expression Expression Primary Mode of Expression: Verbal Verbal Expression Overall Verbal Expression: Appears within functional limits for tasks assessed Repetition:  Impaired Level of Impairment: Sentence level (due to immediate recall deficits) Naming: No impairment Divergent: 0-24% accurate Interfering Components: Attention Written Expression Dominant Hand: Right   Oral / Motor  Oral Motor/Sensory Function Overall Oral Motor/Sensory Function: Within functional limits Motor Speech Overall Motor Speech: Appears within functional limits for tasks assessed   GO                    Juan Quam Laurice 07/05/2019, 3:32 PM  Estill Bamberg L. Tivis Ringer, [place] Office number [phone] Pager [phone]

## 2019-07-05 NOTE — Evaluation (Signed)
Occupational Therapy Evaluation Patient Details Name: Cassidy Smith MRN: [id number] DOB: [birth year] Today's Date: 07/05/2019    History of Present Illness 64 year old female presented to [facility], ER this morning after a fall yesterday and altered mental status. CT scan shows a right temporal hemorrhagic contusion with subarachnoid hemorrhage and intraventricular hemorrhage into the posterior horns bilaterally.  No midline shift or mass-effect. PHMx: Bipolar, TIA, dementia   Clinical Impression   This 64 yo female admitted with above presents to acute OT with PLOF of living alone (but unsure how well she was managing for herself pta). Currently she is unable to answer questions for [place], has difficulty with sit<>stand and ambulation as well as following commands. She will continue to benefit from acute OT with follow up at SNF.     Follow Up Recommendations  SNF;Supervision/Assistance - 24 hour    Equipment Recommendations  Other (comment) (TBD next venue)       Precautions / Restrictions Restrictions Weight Bearing Restrictions: No      Mobility Bed Mobility               General bed mobility comments: pt up in chair upon arrival  Transfers Overall transfer level: Needs assistance Equipment used: Rolling walker (2 wheeled) Transfers: Sit to/from Stand Sit to Stand: Mod assist         General transfer comment: max tactile cues to power up, no initiation, pt with posterior bias, very rigid with feet very close together    Balance Overall balance assessment: Needs assistance Sitting-balance support: Feet supported;Bilateral upper extremity supported Sitting balance-Leahy Scale: Fair   Postural control: Posterior lean Standing balance support: Bilateral upper extremity supported Standing balance-Leahy Scale: Poor Standing balance comment: dependent on bilat UE support                           ADL either performed or assessed with clinical judgement    ADL Overall ADL's : Needs assistance/impaired Eating/Feeding: Maximal assistance;Sitting Eating/Feeding Details (indicate cue type and reason): in recliner Grooming: Wash/dry face;Minimal assistance Grooming Details (indicate cue type and reason): Handed her washcloth and she immediately started washing her face, but then stopped washing and held washcloth on her face Upper Body Bathing: Maximal assistance;Sitting   Lower Body Bathing: Maximal assistance Lower Body Bathing Details (indicate cue type and reason): mod A sit<>stand Upper Body Dressing : Maximal assistance;Sitting   Lower Body Dressing: Maximal assistance Lower Body Dressing Details (indicate cue type and reason): mod A sit<>stand Toilet Transfer: Minimal assistance;+2 for safety/equipment;RW;Ambulation;Comfort height toilet;Grab bars Toilet Transfer Details (indicate cue type and reason): Had to physcially give her pressure at her hips for her sit down (she turned to sit down on toilet with cues then would not sit--"froze") Toileting- Clothing Manipulation and Hygiene: Total assistance Toileting - Clothing Manipulation Details (indicate cue type and reason): mod A sit<>stand             Vision Patient Visual Report: No change from baseline              Pertinent Vitals/Pain Pain Assessment:  (no signs of symptoms of)     Hand Dominance Right   Extremity/Trunk Assessment Upper Extremity Assessment Upper Extremity Assessment: Overall WFL for tasks assessed           Communication  expressive and receptive difficulties   Cognition Arousal/Alertness: Awake/alert Behavior During Therapy: Flat affect Overall Cognitive Status: Impaired/Different from baseline Area of Impairment: Orientation;Attention;Memory;Following commands;Safety/judgement;Awareness;Problem  solving               [place] Levels of Cognitive Functioning [place] Scales of Cognitive Functioning:  Confused/appropriate Orientation Level: Disoriented to;Place;Time;Situation Current Attention Level: Focused Memory: Decreased short-term memory Following Commands: Follows one step commands with increased time;Follows one step commands inconsistently Safety/Judgement: Decreased awareness of safety;Decreased awareness of deficits Awareness: Intellectual Problem Solving: Slow processing;Decreased initiation;Difficulty sequencing;Requires verbal cues;Requires tactile cues General Comments: per RN, according to family pt has only been oriented to herself for awhile              Home Living Family/patient expects to be discharged to:: Skilled nursing facility Living Arrangements: Alone   Type of Home: House                                           OT Problem List: Impaired balance (sitting and/or standing);Decreased cognition;Decreased coordination;Decreased safety awareness      OT Treatment/Interventions: Self-care/ADL training;Patient/family education;DME and/or AE instruction;Balance training;Therapeutic activities    OT Goals(Current goals can be found in the care plan section) Acute Rehab OT Goals Patient Stated Goal: unable OT Goal Formulation: Patient unable to participate in goal setting Time For Goal Achievement: 07/19/19 Potential to Achieve Goals: Good  OT Frequency: Min 2X/week   Barriers to D/C: Decreased caregiver support             AM-PAC OT "6 Clicks" Daily Activity     Outcome Measure Help from another person eating meals?: A Lot Help from another person taking care of personal grooming?: A Lot Help from another person toileting, which includes using toliet, bedpan, or urinal?: A Lot Help from another person bathing (including washing, rinsing, drying)?: A Lot Help from another person to put on and taking off regular upper body clothing?: A Lot Help from another person to put on and taking off regular lower body clothing?: A Lot 6  Click Score: 12   End of Session Equipment Utilized During Treatment: Gait belt;Rolling walker  Activity Tolerance: Patient tolerated treatment well Patient left: in chair;with call bell/phone within reach;with chair alarm set  OT Visit Diagnosis: Unsteadiness on feet (R26.81);Other abnormalities of gait and mobility (R26.89);History of falling (Z91.81);Other symptoms and signs involving cognitive function;Cognitive communication deficit (R41.841) Symptoms and signs involving cognitive functions:  [facility])                Time: 4920-1007 OT Time Calculation (min): 26 min Charges:  OT General Charges $OT Visit: 1 Visit OT Evaluation $OT Eval Moderate Complexity: 1 Mod  [place], OTR/L Acute [employer] Pager [phone] Office [phone]      Almon Register 07/05/2019, 5:58 PM

## 2019-07-05 NOTE — Progress Notes (Signed)
PROGRESS NOTE    Cassidy Smith  INO:[id number] DOB: 1955/07/01 DOA: 07/03/2019 PCP: Neale Burly, MD    Brief Narrative:  64 y.o. female with h/o SIADH; CVA; bipolar; and dementia who presented as a transfer from [place] after a fall with SAH/IVH  Assessment & Plan:   Principal Problem:   Closed head injury Active Problems:   Bipolar 1 disorder ([place])   Current every day smoker   Thrombocytopenia ([place])   Intracerebral hematoma ([employer])   Closed head injury -Patient with AMS after fall remotely -Imaging shows intraparenchymal hemorrhage in the right temporal lobe -neurosurgery following. Repeat CT head reviewed. Findings of expected evolution of R temporal hemorrhagic contusion with increasing surrounding edema. Local mass effect present without midline shift -Defer management to Neurosurgery  Bipolar, Dementia -Continue Depakote, Cogentin, Risperdal, Trazodone as tolerated -Remains pleasantly confused, although family present in room, states current mentation seems slightly better than her typical baseline  Tobacco dependence -Uncertain about recent use -Can consider addition of nicotine patch if needed  Thrombocytopenia -Chronic, had been stable -Would repeat cbc in AM  HLD -Continue Lipitor  Hyperthyroidism -Continue methimazole -TSH 0.805 with free T4 of 0.69  DVT prophylaxis: SCD's Code Status: Full Family Communication: Pt in room, family currently at bedside    Consultants:     Procedures:     Antimicrobials: Anti-infectives (From admission, onward)   None      Subjective: Pleasantly confused. Without complaints  Objective: Vitals:   07/05/19 0429 07/05/19 0759 07/05/19 0800 07/05/19 1133  BP:  113/68 113/68 118/68  Pulse:  67 68 70  Resp:  14 17 19   Temp: 98.3 F (36.8 C) 98 F (36.7 C)  98 F (36.7 C)  TempSrc: Oral Axillary  Oral  SpO2:  94% 92% 95%  Weight:      Height:        Intake/Output Summary (Last 24 hours) at  07/05/2019 1341 Last data filed at 07/05/2019 1051 Gross per 24 hour  Intake 250 ml  Output 150 ml  Net 100 ml   Filed Weights   07/03/19 1036 07/04/19 0125 07/04/19 1500  Weight: 54.4 kg 47.3 kg 46.9 kg    Examination: General exam: Awake, sitting in chair, in nad Respiratory system: Normal respiratory effort, no wheezing Cardiovascular system: regular rate, s1, s2 Gastrointestinal system: Soft, nondistended, positive BS Central nervous system: CN2-12 grossly intact, strength intact Extremities: Perfused, no clubbing Skin: Normal skin turgor, no notable skin lesions seen Psychiatry: Mood normal // no visual hallucinations   Data Reviewed: I have personally reviewed following labs and imaging studies  CBC: Recent Labs  Lab 07/03/19 1106  WBC 8.0  NEUTROABS 5.1  HGB 12.3  HCT 37.5  MCV 96.2  PLT 88*   Basic Metabolic Panel: Recent Labs  Lab 07/03/19 1106 07/05/19 0628  NA 137 132*  K 3.4* 3.9  CL 99 98  CO2 29 27  GLUCOSE 100* 107*  BUN 21 22  CREATININE 0.59 0.51  CALCIUM 9.8 9.4   GFR: Estimated Creatinine Clearance: 51.2 mL/min (by C-G formula based on SCr of 0.51 mg/dL). Liver Function Tests: Recent Labs  Lab 07/05/19 0628  AST 21  ALT 9  ALKPHOS 66  BILITOT 0.7  PROT 5.9*  ALBUMIN 2.6*   No results for input(s): LIPASE, AMYLASE in the last 168 hours. No results for input(s): AMMONIA in the last 168 hours. Coagulation Profile: No results for input(s): INR, PROTIME in the last 168 hours. Cardiac Enzymes: No results  for input(s): CKTOTAL, CKMB, CKMBINDEX, TROPONINI in the last 168 hours. BNP (last 3 results) No results for input(s): PROBNP in the last 8760 hours. HbA1C: No results for input(s): HGBA1C in the last 72 hours. CBG: No results for input(s): GLUCAP in the last 168 hours. Lipid Profile: No results for input(s): CHOL, HDL, LDLCALC, TRIG, CHOLHDL, LDLDIRECT in the last 72 hours. Thyroid Function Tests: Recent Labs    07/03/19 2131    TSH 0.805  FREET4 0.69   Anemia Panel: No results for input(s): VITAMINB12, FOLATE, FERRITIN, TIBC, IRON, RETICCTPCT in the last 72 hours. Sepsis Labs: No results for input(s): PROCALCITON, LATICACIDVEN in the last 168 hours.  Recent Results (from the past 240 hour(s))  SARS Coronavirus 2 by RT PCR (hospital order, performed in [facility] hospital lab) Nasopharyngeal Nasopharyngeal Swab     Status: None   Collection Time: 07/03/19 10:41 AM   Specimen: Nasopharyngeal Swab  Result Value Ref Range Status   SARS Coronavirus 2 NEGATIVE NEGATIVE Final    Comment: (NOTE) SARS-CoV-2 target nucleic acids are NOT DETECTED.  The SARS-CoV-2 RNA is generally detectable in upper and lower respiratory specimens during the acute phase of infection. The lowest concentration of SARS-CoV-2 viral copies this assay can detect is 250 copies / mL. A negative result does not preclude SARS-CoV-2 infection and should not be used as the sole basis for treatment or other patient management decisions.  A negative result may occur with improper specimen collection / handling, submission of specimen other than nasopharyngeal swab, presence of viral mutation(s) within the areas targeted by this assay, and inadequate number of viral copies (<250 copies / mL). A negative result must be combined with clinical observations, patient history, and epidemiological information.  Fact Sheet for Patients:   StrictlyIdeas.no  Fact Sheet for Healthcare Providers: BankingDealers.co.za  This test is not yet approved or  cleared by the [place] FDA and has been authorized for detection and/or diagnosis of SARS-CoV-2 by FDA under an Emergency Use Authorization (EUA).  This EUA will remain in effect (meaning this test can be used) for the duration of the COVID-19 declaration under Section 564(b)(1) of the Act, 21 U.S.C. section 360bbb-3(b)(1), unless the authorization is  terminated or revoked sooner.  Performed at [facility], [address], [place], [place] [address]   MRSA PCR Screening     Status: None   Collection Time: 07/03/19  9:57 PM   Specimen: Nasal Mucosa; Nasopharyngeal  Result Value Ref Range Status   MRSA by PCR NEGATIVE NEGATIVE Final    Comment:        The GeneXpert MRSA Assay (FDA approved for NASAL specimens only), is one component of a comprehensive MRSA colonization surveillance program. It is not intended to diagnose MRSA infection nor to guide or monitor treatment for MRSA infections. Performed at [facility], [place] [address], [place], [place] [address]      Radiology Studies: CT HEAD WO CONTRAST  Result Date: 07/04/2019 CLINICAL DATA:  Head trauma.  Intracranial hemorrhage. EXAM: CT HEAD WITHOUT CONTRAST TECHNIQUE: Contiguous axial images were obtained from the base of the skull through the vertex without intravenous contrast. COMPARISON:  CT head without contrast 07/03/2019. FINDINGS: Brain: Right temporal hemorrhagic contusion demonstrates expected evolution. Increasing surrounding edema is present. Effacement of the sulci is again noted. Scattered areas of subarachnoid hemorrhage again noted bilaterally, right greater than left. Blood is noted in the posterior horns of the lateral ventricles bilaterally. Ventricular enlargement is stable. Moderate white  matter disease is unchanged. 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 Vascular: Vascular calcifications are evident. Skull: Calvarium is intact. No focal lytic or blastic lesions are present. No significant extracranial soft tissue lesion is present. Sinuses/Orbits: The paranasal sinuses and mastoid air cells are clear. The globes and orbits are within normal limits. IMPRESSION: 1. Expected evolution of right temporal hemorrhagic contusion with increasing surrounding edema. Local mass effect is present without midline shift. 2. Stable scattered areas of  subarachnoid hemorrhage bilaterally, right greater than left. These are more visible on today's study, felt to be due to technical factors without new or progressive hemorrhage. 3. Stable ventriculomegaly.  Stable intraventricular hemorrhage. 4. Stable white matter disease. 5. Remote infarcts of the right cerebellum. Electronically Signed   By: San Morelle M.D.   On: 07/04/2019 12:00    Scheduled Meds: . atorvastatin  20 mg Oral q1800  . benztropine  1 mg Oral BID  . Chlorhexidine Gluconate Cloth  6 each Topical Daily  . cholecalciferol  1,000 Units Oral Daily  . divalproex  250 mg Oral Q6H  . docusate sodium  100 mg Oral BID  . furosemide  10 mg Oral QODAY  . gabapentin  100 mg Oral TID  . mouth rinse  15 mL Mouth Rinse BID  . methimazole  10 mg Oral QODAY  . metoprolol tartrate  5 mg Intravenous Once  . multivitamin with minerals  1 tablet Oral Daily  . pantoprazole  40 mg Oral Daily  . potassium chloride  10 mEq Oral QODAY  . risperiDONE  2 mg Oral QHS  . senna  1 tablet Oral BID  . traZODone  100 mg Oral QHS  . vitamin B-12  1,000 mcg Oral Daily   Continuous Infusions: . sodium chloride Stopped (07/04/19 0806)     LOS: 1 day   Marylu Lund, MD [facility] Pager On Amion  If 7PM-7AM, please contact night-coverage 07/05/2019, 1:41 PM

## 2019-07-05 NOTE — Progress Notes (Signed)
Patient ID: Cassidy Smith, female   DOB: 02/09/1955, 64 y.o.   MRN: [id number] Awake alert.  Moves all extremities.  Follows commands.  Is conversant.  Complains of holocephalic headache.  Physical and occupational therapy today.  Will need rehab versus placement I believe.

## 2019-07-06 DIAGNOSIS — W19XXXD Unspecified fall, subsequent encounter: Secondary | ICD-10-CM

## 2019-07-06 LAB — COMPREHENSIVE METABOLIC PANEL
ALT: 13 U/L (ref 0–44)
AST: 31 U/L (ref 15–41)
Albumin: 2.5 g/dL — ABNORMAL LOW (ref 3.5–5.0)
Alkaline Phosphatase: 63 U/L (ref 38–126)
Anion gap: 4 — ABNORMAL LOW (ref 5–15)
BUN: 19 mg/dL (ref 8–23)
CO2: 29 mmol/L (ref 22–32)
Calcium: 9.1 mg/dL (ref 8.9–10.3)
Chloride: 100 mmol/L (ref 98–111)
Creatinine, Ser: 0.59 mg/dL (ref 0.44–1.00)
GFR calc Af Amer: >60 mL/min (ref >60)
GFR calc non Af Amer: >60 mL/min (ref >60)
Glucose, Bld: 99 mg/dL (ref 70–99)
Potassium: 4.9 mmol/L (ref 3.5–5.1)
Sodium: 133 mmol/L — ABNORMAL LOW (ref 135–145)
Total Bilirubin: 0.7 mg/dL (ref 0.3–1.2)
Total Protein: 5.7 g/dL — ABNORMAL LOW (ref 6.5–8.1)

## 2019-07-06 LAB — CBC
HCT: 34.1 % — ABNORMAL LOW (ref 36.0–46.0)
Hemoglobin: 11.5 g/dL — ABNORMAL LOW (ref 12.0–15.0)
MCH: 31.3 pg (ref 26.0–34.0)
MCHC: 33.7 g/dL (ref 30.0–36.0)
MCV: 92.9 fL (ref 80.0–100.0)
Platelets: 119 10*3/uL — ABNORMAL LOW (ref 150–400)
RBC: 3.67 MIL/uL — ABNORMAL LOW (ref 3.87–5.11)
RDW: 14.9 % (ref 11.5–15.5)
WBC: 8.4 10*3/uL (ref 4.0–10.5)
nRBC: 0 % (ref 0.0–0.2)

## 2019-07-06 NOTE — Progress Notes (Signed)
Subjective: Patient reports feeling much better. Resting comfortable curle up in bed  Objective: Vital signs in last 24 hours: Temp:  [97.8 F (36.6 C)-98.6 F (37 C)] 98.3 F (36.8 C) (06/22 0338) Pulse Rate:  [67-96] 96 (06/21 2324) Resp:  [14-21] 19 (06/21 2324) BP: (113-131)/(65-84) 129/68 (06/22 0338) SpO2:  [92 %-99 %] 99 % (06/21 2324)  Intake/Output from previous day: 06/21 0701 - 06/22 0700 In: 640 [P.O.:640] Out: 600 [Urine:600] Intake/Output this shift: No intake/output data recorded.  Neurologic: Grossly normal  Lab Results: Lab Results  Component Value Date   WBC 8.4 07/06/2019   HGB 11.5 (L) 07/06/2019   HCT 34.1 (L) 07/06/2019   MCV 92.9 07/06/2019   PLT 119 (L) 07/06/2019   Lab Results  Component Value Date   INR 1.01 06/09/2017   BMET Lab Results  Component Value Date   NA 133 (L) 07/06/2019   K 4.9 07/06/2019   CL 100 07/06/2019   CO2 29 07/06/2019   GLUCOSE 99 07/06/2019   BUN 19 07/06/2019   CREATININE 0.59 07/06/2019   CALCIUM 9.1 07/06/2019    Studies/Results: CT HEAD WO CONTRAST  Result Date: 07/04/2019 CLINICAL DATA:  Head trauma.  Intracranial hemorrhage. EXAM: CT HEAD WITHOUT CONTRAST TECHNIQUE: Contiguous axial images were obtained from the base of the skull through the vertex without intravenous contrast. COMPARISON:  CT head without contrast 07/03/2019. FINDINGS: Brain: Right temporal hemorrhagic contusion demonstrates expected evolution. Increasing surrounding edema is present. Effacement of the sulci is again noted. Scattered areas of subarachnoid hemorrhage again noted bilaterally, right greater than left. Blood is noted in the posterior horns of the lateral ventricles bilaterally. Ventricular enlargement is stable. Moderate white matter disease is unchanged. 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 Vascular: Vascular calcifications are evident. Skull: Calvarium is intact. No focal lytic or blastic lesions are present.  No significant extracranial soft tissue lesion is present. Sinuses/Orbits: The paranasal sinuses and mastoid air cells are clear. The globes and orbits are within normal limits. IMPRESSION: 1. Expected evolution of right temporal hemorrhagic contusion with increasing surrounding edema. Local mass effect is present without midline shift. 2. Stable scattered areas of subarachnoid hemorrhage bilaterally, right greater than left. These are more visible on today's study, felt to be due to technical factors without new or progressive hemorrhage. 3. Stable ventriculomegaly.  Stable intraventricular hemorrhage. 4. Stable white matter disease. 5. Remote infarcts of the right cerebellum. Electronically Signed   By: San Morelle M.D.   On: 07/04/2019 12:00    Assessment/Plan: S/p closed head injury improving greatly. Much more alert and oriented than when she was admitted originally. Ordered case management consult for SNF placement. Continue therapy today   LOS: 2 days    Ocie Cornfield [facility] 07/06/2019, 7:54 AM

## 2019-07-06 NOTE — NC FL2 (Signed)
[place] MEDICAID FL2 LEVEL OF CARE SCREENING TOOL     IDENTIFICATION  Patient Name: Cassidy Smith Birthdate: 11/28/1955 Sex: female Admission Date (Current Location): 07/03/2019  [facility] and [place] Number:  [employer] and Address:  [facility], [place] [address] [place], [place] [address]      Provider Number: [id number]  Attending Physician Name and Address:  Eustace Moore, MD  Relative Name and Phone Number:       Current Level of Care: Hospital Recommended Level of Care: [place] Prior Approval Number:    Date Approved/Denied:   PASRR Number: [id number]  Discharge Plan: SNF    Current Diagnoses: Patient Active Problem List   Diagnosis Date Noted  . Intracerebral hematoma ([place]) 07/04/2019  . Closed head injury 07/03/2019  . Thrombocytopenia ([place]) 07/03/2019  . UTI (urinary tract infection) 01/04/2018  . Neuromuscular disorder ([place])   . Dysuria 10/30/2017  . Fever 08/15/2017  . Hypokalemia 08/15/2017  . Altered mental state 08/15/2017  . Acute metabolic encephalopathy 55/97/4163  . Valproic acid toxicity 08/15/2017  . Altered mental status   . Ischemic stroke ([place])   . GERD (gastroesophageal reflux disease) 05/12/2017  . GI bleed   . SIADH (syndrome of inappropriate ADH production) ([place]) 04/08/2017  . Dysplastic rectal polyp   . Protein-calorie malnutrition, severe 04/07/2017  . Pressure injury of skin 04/04/2017  . Anticoagulated 04/03/2017  . Stool bloody 04/03/2017  . Current every day smoker 04/03/2017  . Rectal bleeding   . Chronic diarrhea   . Hyperthyroidism   . S/P ORIF (open reduction internal fixation) fracture right hip IM nail 03/07/17 03/24/2017  . Hypomagnesemia 03/07/2017  . Fall   . Closed intertrochanteric fracture of hip, right, initial encounter ([place]) 03/06/2017  . Radial styloid fracture: right 03/06/2017  . Anemia 03/06/2017  . Orthostatic hypotension 06/07/2015  . Lower  urinary tract infectious disease 06/07/2015  . Rhabdomyolysis 06/07/2015  . White matter abnormality on MRI of brain 05/14/2013  . History of depression 05/14/2013  . Hx of anxiety disorder 05/14/2013  . Bipolar I disorder, most recent episode (or current) manic, unspecified 05/14/2013  . Bipolar 1 disorder ([place]) 05/14/2013  . Hyponatremia 05/12/2013  . Bipolar disorder ([place]) 05/12/2013  . Lacunar infarct, acute ([place]) 05/12/2013    Orientation RESPIRATION BLADDER Height & Weight     Self  Normal External catheter, Incontinent Weight: 103 lb 6.3 oz (46.9 kg) Height:  5\' 6"  (167.6 cm)  BEHAVIORAL SYMPTOMS/MOOD NEUROLOGICAL BOWEL NUTRITION STATUS      Incontinent Diet (please see discharge summary)  AMBULATORY STATUS COMMUNICATION OF NEEDS Skin     Verbally Surgical wounds                       Personal Care Assistance Level of Assistance  Bathing, Feeding, Dressing Bathing Assistance: Maximum assistance Feeding assistance: Maximum assistance Dressing Assistance: Maximum assistance     Functional Limitations Info  Sight, Hearing, Speech Sight Info: Adequate Hearing Info: Adequate Speech Info: Adequate    SPECIAL CARE FACTORS FREQUENCY  PT (By licensed PT), OT (By licensed OT)     PT Frequency: 5x per week OT Frequency: 5x per week            Contractures Contractures Info: Not present    Additional Factors Info  Code Status, Allergies Code Status Info: FULL Allergies Info: NKA           Current Medications (07/06/2019):  This is the current hospital active medication list Current Facility-Administered Medications  Medication Dose Route Frequency Provider Last Rate Last Admin  . 0.9 %  sodium chloride infusion   Intravenous Continuous Ashok Pall, MD   Stopped at 07/04/19 [phone]  . acetaminophen (TYLENOL) tablet 650 mg  650 mg Oral Q6H PRN Ashok Pall, MD       Or  . acetaminophen (TYLENOL) suppository 650 mg  650 mg Rectal Q6H PRN Ashok Pall, MD       . atorvastatin (LIPITOR) tablet 20 mg  20 mg Oral q1800 Ashok Pall, MD   20 mg at 07/05/19 1729  . benztropine (COGENTIN) tablet 1 mg  1 mg Oral BID Ashok Pall, MD   1 mg at 07/06/19 1001  . bisacodyl (DULCOLAX) suppository 10 mg  10 mg Rectal Daily PRN Ashok Pall, MD      . Chlorhexidine Gluconate Cloth 2 % PADS 6 each  6 each Topical Daily Ashok Pall, MD   6 each at 07/05/19 0930  . cholecalciferol (VITAMIN D3) tablet 1,000 Units  1,000 Units Oral Daily Ashok Pall, MD   1,000 Units at 07/06/19 1001  . divalproex (DEPAKOTE SPRINKLE) capsule 250 mg  250 mg Oral Q6H Ashok Pall, MD   250 mg at 07/06/19 1253  . docusate sodium (COLACE) capsule 100 mg  100 mg Oral BID Ashok Pall, MD   100 mg at 07/06/19 1001  . furosemide (LASIX) tablet 10 mg  10 mg Oral Vernice Jefferson, MD   10 mg at 07/06/19 1002  . gabapentin (NEURONTIN) capsule 100 mg  100 mg Oral TID Ashok Pall, MD   100 mg at 07/06/19 1001  . hydrALAZINE (APRESOLINE) injection 5 mg  5 mg Intravenous Q4H PRN Ashok Pall, MD   5 mg at 07/04/19 0123  . HYDROcodone-acetaminophen (NORCO/VICODIN) 5-325 MG per tablet 1-2 tablet  1-2 tablet Oral Q4H PRN Ashok Pall, MD      . MEDLINE mouth rinse  15 mL Mouth Rinse BID Ashok Pall, MD   15 mL at 07/06/19 1003  . methimazole (TAPAZOLE) tablet 10 mg  10 mg Oral Vernice Jefferson, MD   10 mg at 07/05/19 0928  . metoprolol tartrate (LOPRESSOR) injection 5 mg  5 mg Intravenous Q6H PRN Ashok Pall, MD   5 mg at 07/04/19 0012  . metoprolol tartrate (LOPRESSOR) injection 5 mg  5 mg Intravenous Once Ashok Pall, MD      . multivitamin with minerals tablet 1 tablet  1 tablet Oral Daily Ashok Pall, MD   1 tablet at 07/06/19 1002  . ondansetron (ZOFRAN) tablet 4 mg  4 mg Oral Q6H PRN Ashok Pall, MD       Or  . ondansetron (ZOFRAN) injection 4 mg  4 mg Intravenous Q6H PRN Ashok Pall, MD   4 mg at 07/04/19 0533  . pantoprazole (PROTONIX) EC tablet 40 mg  40 mg Oral  Daily Ashok Pall, MD   40 mg at 07/06/19 1001  . polyethylene glycol (MIRALAX / GLYCOLAX) packet 17 g  17 g Oral Daily PRN Ashok Pall, MD      . potassium chloride (KLOR-CON) CR tablet 10 mEq  10 mEq Oral Vernice Jefferson, MD   10 mEq at 07/06/19 1001  . risperiDONE (RISPERDAL) tablet 2 mg  2 mg Oral QHS Ashok Pall, MD   2 mg at 07/05/19 2321  . senna (SENOKOT) tablet 8.6 mg  1 tablet Oral BID Ashok Pall, MD  8.6 mg at 07/06/19 1001  . traZODone (DESYREL) tablet 100 mg  100 mg Oral QHS Ashok Pall, MD   100 mg at 07/05/19 2321  . vitamin B-12 (CYANOCOBALAMIN) tablet 1,000 mcg  1,000 mcg Oral Daily Ashok Pall, MD   1,000 mcg at 07/06/19 1001     Discharge Medications: Please see discharge summary for a list of discharge medications.  Relevant Imaging Results:  Relevant Lab Results:   Additional Information SSN [id number]  Vinie Sill, LCSWA

## 2019-07-06 NOTE — Progress Notes (Signed)
PROGRESS NOTE    Cassidy POINSETT  Smith:[id number] DOB: [birth year] DOA: 07/03/2019 PCP: Neale Burly, MD    Brief Narrative:  64 y.o. female with h/o SIADH; CVA; bipolar; and dementia who presented as a transfer from [place] after a fall with SAH/IVH  Assessment & Plan:   Principal Problem:   Closed head injury Active Problems:   Bipolar 1 disorder ([place])   Current every day smoker   Thrombocytopenia ([place])   Intracerebral hematoma ([place])   Closed head injury -Patient with AMS after fall remotely -Imaging shows intraparenchymal hemorrhage in the right temporal lobe -neurosurgery following. Repeat CT head reviewed. Findings of expected evolution of R temporal hemorrhagic contusion with increasing surrounding edema. Local mass effect present without midline shift -Defer management to Neurosurgery  Bipolar, Dementia -Continue Depakote, Cogentin, Risperdal, Trazodone as tolerated -Family recently reported mentation seems better than baseline  Tobacco dependence -Uncertain about recent use -Can consider addition of nicotine patch if needed  Thrombocytopenia -Chronic, had been stable -labs reviewed. Platelets have improved to 119 today  HLD -Continue Lipitor  Hyperthyroidism -Continue methimazole -TSH 0.805 with free T4 of 0.69  Patient currently medically stable. Will sign off for now. Please call if we may be of further assistance. Thank you for this consult.  DVT prophylaxis: SCD's Code Status: Full Family Communication: Pt in room, family currently not at bedside    Consultants:     Procedures:     Antimicrobials: Anti-infectives (From admission, onward)   None      Subjective: Without complaints  Objective: Vitals:   07/06/19 0338 07/06/19 0338 07/06/19 0840 07/06/19 1147  BP: 129/68  130/66 137/76  Pulse:   78 65  Resp:   18 18  Temp:  98.3 F (36.8 C) 97.7 F (36.5 C) 98.8 F (37.1 C)  TempSrc:  Oral Oral Axillary  SpO2:   96% 99%    Weight:      Height:        Intake/Output Summary (Last 24 hours) at 07/06/2019 1452 Last data filed at 07/06/2019 1340 Gross per 24 hour  Intake 840 ml  Output 600 ml  Net 240 ml   Filed Weights   07/03/19 1036 07/04/19 0125 07/04/19 1500  Weight: 54.4 kg 47.3 kg 46.9 kg    Examination: General exam: Conversant, in no acute distress Respiratory system: normal chest rise, clear, no audible wheezing Cardiovascular system: regular rhythm, s1-s2 Gastrointestinal system: Nondistended, nontender, pos BS Central nervous system: No seizures, no tremors Extremities: No cyanosis, no joint deformities Skin: No rashes, no pallor Psychiatry: Affect normal // no auditory hallucinations   Data Reviewed: I have personally reviewed following labs and imaging studies  CBC: Recent Labs  Lab 07/03/19 1106 07/06/19 0441  WBC 8.0 8.4  NEUTROABS 5.1  --   HGB 12.3 11.5*  HCT 37.5 34.1*  MCV 96.2 92.9  PLT 88* 811*   Basic Metabolic Panel: Recent Labs  Lab 07/03/19 1106 07/05/19 0628 07/06/19 0441  NA 137 132* 133*  K 3.4* 3.9 4.9  CL 99 98 100  CO2 29 27 29   GLUCOSE 100* 107* 99  BUN 21 22 19   CREATININE 0.59 0.51 0.59  CALCIUM 9.8 9.4 9.1   GFR: Estimated Creatinine Clearance: 51.2 mL/min (by C-G formula based on SCr of 0.59 mg/dL). Liver Function Tests: Recent Labs  Lab 07/05/19 0628 07/06/19 0441  AST 21 31  ALT 9 13  ALKPHOS 66 63  BILITOT 0.7 0.7  PROT 5.9* 5.7*  ALBUMIN  2.6* 2.5*   No results for input(s): LIPASE, AMYLASE in the last 168 hours. No results for input(s): AMMONIA in the last 168 hours. Coagulation Profile: No results for input(s): INR, PROTIME in the last 168 hours. Cardiac Enzymes: No results for input(s): CKTOTAL, CKMB, CKMBINDEX, TROPONINI in the last 168 hours. BNP (last 3 results) No results for input(s): PROBNP in the last 8760 hours. HbA1C: No results for input(s): HGBA1C in the last 72 hours. CBG: No results for input(s): GLUCAP in  the last 168 hours. Lipid Profile: No results for input(s): CHOL, HDL, LDLCALC, TRIG, CHOLHDL, LDLDIRECT in the last 72 hours. Thyroid Function Tests: Recent Labs    07/03/19 2131  TSH 0.805  FREET4 0.69   Anemia Panel: No results for input(s): VITAMINB12, FOLATE, FERRITIN, TIBC, IRON, RETICCTPCT in the last 72 hours. Sepsis Labs: No results for input(s): PROCALCITON, LATICACIDVEN in the last 168 hours.  Recent Results (from the past 240 hour(s))  SARS Coronavirus 2 by RT PCR (hospital order, performed in [facility] hospital lab) Nasopharyngeal Nasopharyngeal Swab     Status: None   Collection Time: 07/03/19 10:41 AM   Specimen: Nasopharyngeal Swab  Result Value Ref Range Status   SARS Coronavirus 2 NEGATIVE NEGATIVE Final    Comment: (NOTE) SARS-CoV-2 target nucleic acids are NOT DETECTED.  The SARS-CoV-2 RNA is generally detectable in upper and lower respiratory specimens during the acute phase of infection. The lowest concentration of SARS-CoV-2 viral copies this assay can detect is 250 copies / mL. A negative result does not preclude SARS-CoV-2 infection and should not be used as the sole basis for treatment or other patient management decisions.  A negative result may occur with improper specimen collection / handling, submission of specimen other than nasopharyngeal swab, presence of viral mutation(s) within the areas targeted by this assay, and inadequate number of viral copies (<250 copies / mL). A negative result must be combined with clinical observations, patient history, and epidemiological information.  Fact Sheet for Patients:   StrictlyIdeas.no  Fact Sheet for Healthcare Providers: BankingDealers.co.za  This test is not yet approved or  cleared by the [place] FDA and has been authorized for detection and/or diagnosis of SARS-CoV-2 by FDA under an Emergency Use Authorization (EUA).  This EUA will remain in  effect (meaning this test can be used) for the duration of the COVID-19 declaration under Section 564(b)(1) of the Act, 21 U.S.C. section 360bbb-3(b)(1), unless the authorization is terminated or revoked sooner.  Performed at [facility], [address], [place], [place] [address]   MRSA PCR Screening     Status: None   Collection Time: 07/03/19  9:57 PM   Specimen: Nasal Mucosa; Nasopharyngeal  Result Value Ref Range Status   MRSA by PCR NEGATIVE NEGATIVE Final    Comment:        The GeneXpert MRSA Assay (FDA approved for NASAL specimens only), is one component of a comprehensive MRSA colonization surveillance program. It is not intended to diagnose MRSA infection nor to guide or monitor treatment for MRSA infections. Performed at [facility], [place] [address] [place], [place] [address]      Radiology Studies: No results found.  Scheduled Meds: . atorvastatin  20 mg Oral q1800  . benztropine  1 mg Oral BID  . Chlorhexidine Gluconate Cloth  6 each Topical Daily  . cholecalciferol  1,000 Units Oral Daily  . divalproex  250 mg Oral Q6H  . docusate sodium  100 mg Oral BID  .  furosemide  10 mg Oral QODAY  . gabapentin  100 mg Oral TID  . mouth rinse  15 mL Mouth Rinse BID  . methimazole  10 mg Oral QODAY  . metoprolol tartrate  5 mg Intravenous Once  . multivitamin with minerals  1 tablet Oral Daily  . pantoprazole  40 mg Oral Daily  . potassium chloride  10 mEq Oral QODAY  . risperiDONE  2 mg Oral QHS  . senna  1 tablet Oral BID  . traZODone  100 mg Oral QHS  . vitamin B-12  1,000 mcg Oral Daily   Continuous Infusions: . sodium chloride Stopped (07/04/19 0806)     LOS: 2 days   Marylu Lund, MD [facility] Pager On Amion  If 7PM-7AM, please contact night-coverage 07/06/2019, 2:52 PM

## 2019-07-06 NOTE — TOC Initial Note (Signed)
Transition of Care [facility]) - Initial/Assessment Note    Patient Details  Name: Cassidy Smith MRN: [id number] Date of Birth: Sep 03, 1955  Transition of Care [facility]) CM/SW Contact:    Cassidy Smith, [place] Phone Number: 07/06/2019, 2:49 PM  Clinical Narrative:                  CSW spoke with patient's son,Cassidy Smith. CSW introduced self and explained role. Patient's son states he lives in [place] but the patient lives with her brother Cassidy Smith in [place]. Patient has two other brothers, Cassidy Smith and Cassidy Smith, that are involved in her care. Cassidy Smith requested CSW update patient's brother, Cassidy Smith regarding SNF and SNF placement. Cassidy Smith explained patient's brothers has the final word on her care and placement options. CSW explained the SNF process and was given permission to send out SNF referrals.  CSW spoke with patient's brother,Cassidy Smith. Cassidy Smith informed CSW he and his brothers has been taking turns caring for patient in the home. She has an aide that come 4 days per week but no assistance during the weekend. CSW advised to contact Care agency and or  [facility] DSS and request evaluation for more personal care services hours. He shared, he and his brothers helped care for another sister that has passed away. Cassidy Smith states sometimes the patient can walk and sometimes she can't, "it comes and goes". Cassidy Smith states he is not familiar with any SNFs but is agreeable to SNF placement. CSW explained the SNF process. CSW answered all questions. CSW encourage the brother to begin discussion with family on long term goals for the patient.  Cassidy Smith states he will have patient's brother Cassidy Smith, to call CSW as well.  Sent out SNF referrals- will provide bed offers once available.   Cassidy Smith, MSW, [place] Clinical Social Worker   Expected Discharge Plan: Skilled Nursing Facility Barriers to Discharge: SNF Pending bed offer   Patient Goals and CMS Choice Patient states their goals for this hospitalization and ongoing recovery  are:: per family"to return home"      Expected Discharge Plan and Services Expected Discharge Plan: [place] In-house Referral: Clinical Social Work     Living arrangements for the past 2 months: Single Family Home                                      Prior Living Arrangements/Services Living arrangements for the past 2 months: Single Family Home Lives with:: Self, Siblings Patient language and need for interpreter reviewed:: No        Need for Family Participation in Patient Care: Yes (Comment) Care giver support system in place?: Yes (comment)   Criminal Activity/Legal Involvement Pertinent to Current Situation/Hospitalization: No - Comment as needed  Activities of Daily Living      Permission Sought/Granted Permission sought to share information with : Family Supports Permission granted to share information with : Yes, Verbal Permission Granted  Share Information with NAME: Cassidy Smith  Permission granted to share info w AGENCY: SNFs  Permission granted to share info w Relationship: son  Permission granted to share info w Contact Information: [phone]  Emotional Assessment   Attitude/Demeanor/Rapport: Unable to Assess Affect (typically observed): Unable to Assess Orientation: : Oriented to Self Alcohol / Substance Use: Tobacco Use Psych Involvement: No (comment)  Admission diagnosis:  Intracranial hemorrhage ([place]) [I62.9] Fall [W19.XXXA] Closed head injury [S09.90XA] Intracerebral hematoma [facility]) [I61.9] Patient Active Problem List  Diagnosis Date Noted  . Intracerebral hematoma ([place]) 07/04/2019  . Closed head injury 07/03/2019  . Thrombocytopenia ([place]) 07/03/2019  . UTI (urinary tract infection) 01/04/2018  . Neuromuscular disorder ([place])   . Dysuria 10/30/2017  . Fever 08/15/2017  . Hypokalemia 08/15/2017  . Altered mental state 08/15/2017  . Acute metabolic encephalopathy 83/35/8251  . Valproic acid toxicity 08/15/2017  .  Altered mental status   . Ischemic stroke ([place])   . GERD (gastroesophageal reflux disease) 05/12/2017  . GI bleed   . SIADH (syndrome of inappropriate ADH production) ([place]) 04/08/2017  . Dysplastic rectal polyp   . Protein-calorie malnutrition, severe 04/07/2017  . Pressure injury of skin 04/04/2017  . Anticoagulated 04/03/2017  . Stool bloody 04/03/2017  . Current every day smoker 04/03/2017  . Rectal bleeding   . Chronic diarrhea   . Hyperthyroidism   . S/P ORIF (open reduction internal fixation) fracture right hip IM nail 03/07/17 03/24/2017  . Hypomagnesemia 03/07/2017  . Fall   . Closed intertrochanteric fracture of hip, right, initial encounter ([place]) 03/06/2017  . Radial styloid fracture: right 03/06/2017  . Anemia 03/06/2017  . Orthostatic hypotension 06/07/2015  . Lower urinary tract infectious disease 06/07/2015  . Rhabdomyolysis 06/07/2015  . White matter abnormality on MRI of brain 05/14/2013  . History of depression 05/14/2013  . Hx of anxiety disorder 05/14/2013  . Bipolar I disorder, most recent episode (or current) manic, unspecified 05/14/2013  . Bipolar 1 disorder ([place]) 05/14/2013  . Hyponatremia 05/12/2013  . Bipolar disorder ([place]) 05/12/2013  . Lacunar infarct, acute ([place]) 05/12/2013   PCP:  Cassidy Burly, MD Pharmacy:   [facility] [place] [address] [place] [address] Phone: [phone] Fax: [place], [place] [address] [place] [address] Phone: [phone] Fax: [phone]     Social Determinants of Health (SDOH) Interventions    Readmission Risk Interventions No flowsheet data found.

## 2019-07-07 LAB — SARS CORONAVIRUS 2 (TAT 6-24 HRS): SARS Coronavirus 2: NEGATIVE

## 2019-07-07 NOTE — TOC Progression Note (Signed)
Transition of Care [facility]) - Progression Note    Patient Details  Name: Cassidy Smith MRN: [id number] Date of Birth: Oct 13, 1955  Transition of Care [facility]) CM/SW [place], [place] Phone Number: 07/07/2019, 2:17 PM  Clinical Narrative:     Spoke with patient's brother, Cassidy Smith(10yrs older), also her main caregiver. CSW discussed SNF verses home. Mikes inquired if rehab was going to really help. He shared some concerns about nursing facilities , such as how they smell but was somewhat agreeable to SNF placement. CSW explained at any point family can contact the facility's ombudsman to share/report if concerns. He confirmed patient has an aide that comes to the home M-F 8-2pm 5 days per week. He shared  how they(brother) all pitch in to care for the patient. CSW acknowledge the support,encouragement, and care provided to the patient by her brothers. Cassidy Smith advised the patient's son,Cassidy Smith will be here tomorrow and requested he is allowed the opportunity to see her and weigh in on the decision. CSW explain will prepare for SNF and confirm with the family tomorrow. Cassidy Smith states he understands and appreciated CSW help.   CSW contacted Cassidy Smith, provided update and explain [facility] is the only facility that offered in the [place] area. Cassidy Smith states he will arrive here in [place] by 11:30am. CSW explained per MD patient is medically ready for discharge SNF or home. CSW encourage Cassidy Smith to discuss discharge plan with patient's brother,Cassidy Smith and get back with CSW tomorrow. CSW explained, will confirmed bed offer and request covid- if family decides on SNF she will be ready.   Cassidy Smith, MSW, [place] Clinical Social Worker   Expected Discharge Plan: Skilled Nursing Facility Barriers to Discharge: SNF Pending bed offer  Expected Discharge Plan and Services Expected Discharge Plan: [place] In-house Referral: Clinical Social Work     Living arrangements for the past 2  months: Single Family Home                                       Social Determinants of Health (SDOH) Interventions    Readmission Risk Interventions No flowsheet data found.

## 2019-07-07 NOTE — Progress Notes (Signed)
Patient ID: Cassidy Smith, female   DOB: Oct 06, 1955, 64 y.o.   MRN: [id number] Subjective: Patient reports some headache.  Mild.  No visual changes.  No numbness tingling weakness.  Eating well.  Objective: Vital signs in last 24 hours: Temp:  [97.9 F (36.6 C)-98.4 F (36.9 C)] 98.4 F (36.9 C) (06/23 0845) Pulse Rate:  [71-83] 83 (06/23 0845) Resp:  [16-20] 18 (06/23 0845) BP: (119-147)/(74-85) 137/85 (06/23 0845) SpO2:  [95 %-98 %] 98 % (06/23 0845) Weight:  [51.2 kg] 51.2 kg (06/23 0500)  Intake/Output from previous day: 06/22 0701 - 06/23 0700 In: 900 [P.O.:900] Out: 650 [Urine:650] Intake/Output this shift: Total I/O In: 1090.8 [P.O.:400; I.V.:690.8] Out: 1000 [Urine:1000]  Neurologic: Grossly normal, she is awake and alert moving everything.  She is out of bed sitting in a chair.  She is watching TV.  Lab Results: Lab Results  Component Value Date   WBC 8.4 07/06/2019   HGB 11.5 (L) 07/06/2019   HCT 34.1 (L) 07/06/2019   MCV 92.9 07/06/2019   PLT 119 (L) 07/06/2019   Lab Results  Component Value Date   INR 1.01 06/09/2017   BMET Lab Results  Component Value Date   NA 133 (L) 07/06/2019   K 4.9 07/06/2019   CL 100 07/06/2019   CO2 29 07/06/2019   GLUCOSE 99 07/06/2019   BUN 19 07/06/2019   CREATININE 0.59 07/06/2019   CALCIUM 9.1 07/06/2019    Studies/Results: No results found.  Assessment/Plan: Overall she is stable.  Awaiting SNF  Estimated body mass index is 18.22 kg/m as calculated from the following:   Height as of this encounter: 5\' 6"  (1.676 m).   Weight as of this encounter: 51.2 kg.    LOS: 3 days    Eustace Moore 07/07/2019, 11:49 AM

## 2019-07-07 NOTE — Progress Notes (Signed)
  Speech Language Pathology Treatment: Dysphagia  Patient Details Name: Cassidy Smith MRN: [id number] DOB: Mar 23, 1955 Today's Date: 07/07/2019 Time: 3559-7416 SLP Time Calculation (min) (ACUTE ONLY): 8 min  Assessment / Plan / Recommendation Clinical Impression  Pt very drowsy and poorly participatory this afternoon.  Minimally verbal; required tactile/verbal/visual cues to maintain wakefulness, attend to POs, and participate in cognitive/swallowing therapy.  Demonstrating oral holding of liquids today with max cues needed to swallow.  Ceased session given lethargy.  Recommend continuing dysphagia 2/thin liquids when alert.  Pt will benefit from SLP f/u at SNF to address cognitive changes and swallowing.  Will follow pending D/C.   HPI HPI: Cassidy Smith is an 64 y.o. female with h/o  CVA; bipolar; and dementia who presented as a transfer from [place] after a fall with SAH/IVH.        SLP Plan  Continue with current plan of care       Recommendations  Diet recommendations: Dysphagia 2 (fine chop);Thin liquid Liquids provided via: Cup;Straw Medication Administration: Whole meds with liquid Compensations: Minimize environmental distractions;Slow rate;Small sips/bites Postural Changes and/or Swallow Maneuvers: Seated upright 90 degrees                Oral Care Recommendations: Oral care BID Follow up Recommendations: Skilled Nursing facility SLP Visit Diagnosis: Dysphagia, unspecified (R13.10) Plan: Continue with current plan of care       GO                Juan Quam Laurice 07/07/2019, 3:01 PM

## 2019-07-07 NOTE — Progress Notes (Signed)
Physical Therapy Treatment Patient Details Name: Cassidy Smith MRN: [id number] DOB: 1955/04/28 Today's Date: 07/07/2019    History of Present Illness 63 year old female presented to [facility], ER this morning after a fall yesterday and altered mental status. CT scan shows a right temporal hemorrhagic contusion with subarachnoid hemorrhage and intraventricular hemorrhage into the posterior horns bilaterally.  No midline shift or mass-effect. PHMx: Bipolar, TIA, dementia    PT Comments    Pt pleasant but confused. Pt with dementia which inhibits pt's comprehension of tasks asked. Pt with decreased insight to safety and deficits. Pt requiring maxA for transfers and ambulation partially due to impaired comprehension and also suspected fear of falling. Cont to recommend SNF or memory unit upon d/c as pt is unsafe to return home alone as she is unable to care for self and is at increased falls risk. Acute PT to cont to follow.    Follow Up Recommendations  SNF;Supervision/Assistance - 24 hour (or memory unit)     Equipment Recommendations  None recommended by PT    Recommendations for Other Services       Precautions / Restrictions Precautions Precautions: Fall Restrictions Weight Bearing Restrictions: No    Mobility  Bed Mobility               General bed mobility comments: pt up in chair  Transfers Overall transfer level: Needs assistance Equipment used: Rolling walker (2 wheeled) Transfers: Sit to/from Stand Sit to Stand: Max assist         General transfer comment: max tactile cues, pt with no initiation and significant resistance to tacile cues. Pt asked to hug PT which transitioned pt anteriorly and allowed PT to assist pt into standing with maxA  Ambulation/Gait Ambulation/Gait assistance: Mod assist;+2 safety/equipment Gait Distance (Feet): 10 Feet Assistive device: Rolling walker (2 wheeled) Gait Pattern/deviations: Step-to pattern;Decreased stride  length;Decreased weight shift to right;Shuffle;Festinating;Trunk flexed;Narrow base of support Gait velocity: slow Gait velocity interpretation: <1.8 ft/sec, indicate of risk for recurrent falls General Gait Details: pt with decreased R foot step length compared to the L, pt requiring maxA for walker management, pt pushing walker but not moving feet, pt holds feet very close together. suspect pt with fear of falling as pt stopped walking and became resistant to all tactile cues   Stairs             Wheelchair Mobility    Modified Rankin (Stroke Patients Only)       Balance Overall balance assessment: Needs assistance Sitting-balance support: Feet supported;Bilateral upper extremity supported Sitting balance-Leahy Scale: Fair   Postural control: Posterior lean Standing balance support: Bilateral upper extremity supported Standing balance-Leahy Scale: Poor Standing balance comment: dependent on bilat UE support                            Cognition Arousal/Alertness: Awake/alert Behavior During Therapy: Flat affect Overall Cognitive Status: History of cognitive impairments - at baseline                 [place] Levels of Cognitive Functioning [place] [employer] Scales of Cognitive Functioning: Confused/appropriate               General Comments: family reports this to be her new normal, pt minimally verbal, decreased command follow, decreased insight to deficits, pt confused with poor memory. Pt unable to state what she wants for lunch despite max encouragement and verbal cues providing options. Pt non-verbal and  didn't respond to food choices.      Exercises      General Comments General comments (skin integrity, edema, etc.): frail      Pertinent Vitals/Pain Pain Assessment: Faces Faces Pain Scale: No hurt    Home Living                      Prior Function            PT Goals (current goals can now be found in the care plan  section) Progress towards PT goals: Not progressing toward goals - comment    Frequency    Min 2X/week      PT Plan Current plan remains appropriate    Co-evaluation              AM-PAC PT "6 Clicks" Mobility   Outcome Measure  Help needed turning from your back to your side while in a flat bed without using bedrails?: A Lot Help needed moving from lying on your back to sitting on the side of a flat bed without using bedrails?: A Lot Help needed moving to and from a bed to a chair (including a wheelchair)?: A Lot Help needed standing up from a chair using your arms (e.g., wheelchair or bedside chair)?: A Lot Help needed to walk in hospital room?: A Lot Help needed climbing 3-5 steps with a railing? : Total 6 Click Score: 11    End of Session Equipment Utilized During Treatment: Gait belt Activity Tolerance: Patient tolerated treatment well Patient left: in chair;with call bell/phone within reach;with chair alarm set Nurse Communication: Mobility status PT Visit Diagnosis: Difficulty in walking, not elsewhere classified (R26.2)     Time: [id number] PT Time Calculation (min) (ACUTE ONLY): 18 min  Charges:  $Gait Training: 8-22 mins                     Cassidy Smith, PT, DPT Acute Rehabilitation Services Pager #: [phone] Office #: [phone]    Cassidy Smith 07/07/2019, 1:06 PM

## 2019-07-07 NOTE — TOC CAGE-AID Note (Signed)
Transition of Care [facility]) - CAGE-AID Screening   Patient Details  Name: Cassidy Smith MRN: [id number] Date of Birth: 1955-10-24  Transition of Care [facility]) CM/SW Contact:    Emeterio Reeve, [place] Phone Number: 07/07/2019, 12:09 PM   Clinical Narrative:  Pt was not able to participate in assessment due to not being fully oriented.   CAGE-AID Screening: Substance Abuse Screening unable to be completed due to: : Patient unable to participate                  [place] Clinical Social Worker [phone]

## 2019-07-08 NOTE — Progress Notes (Signed)
Upon RN's shift assessment, the patient is less responsive now than she has previously been the past two nights. Day RN reported that the patient has been lethargic today, but the most recent assessment charted shows that the patient was last A&Ox2. Now, the patient is not answering any orientation questions. MD notified and updated. MD ordered 0500 morning labs, including a CBC and BMP. Patient resting in bed.

## 2019-07-08 NOTE — Progress Notes (Signed)
Subjective: Patient reports mild headache but resting comfortably in bed.   Objective: Vital signs in last 24 hours: Temp:  [98.2 F (36.8 C)-99 F (37.2 C)] 99 F (37.2 C) (06/24 0333) Pulse Rate:  [69-85] 73 (06/24 0333) Resp:  [16-22] 18 (06/24 0333) BP: (114-138)/(67-85) 128/72 (06/24 0333) SpO2:  [92 %-98 %] 92 % (06/24 0333) Weight:  [49.1 kg] 49.1 kg (06/24 0500)  Intake/Output from previous day: 06/23 0701 - 06/24 0700 In: 1340.8 [P.O.:650; I.V.:690.8] Out: 2550 [Urine:2550] Intake/Output this shift: No intake/output data recorded.  Neurologic: Grossly normal  Lab Results: Lab Results  Component Value Date   WBC 8.4 07/06/2019   HGB 11.5 (L) 07/06/2019   HCT 34.1 (L) 07/06/2019   MCV 92.9 07/06/2019   PLT 119 (L) 07/06/2019   Lab Results  Component Value Date   INR 1.01 06/09/2017   BMET Lab Results  Component Value Date   NA 133 (L) 07/06/2019   K 4.9 07/06/2019   CL 100 07/06/2019   CO2 29 07/06/2019   GLUCOSE 99 07/06/2019   BUN 19 07/06/2019   CREATININE 0.59 07/06/2019   CALCIUM 9.1 07/06/2019    Studies/Results: No results found.  Assessment/Plan: 64 year old female s/p CHI. According to social work, family is wanting the patient to be discharged home with 24 hour care between home health and her family. I believe that this is reasonable if this is what they decide on. Continue therapies today.    LOS: 4 days    Ocie Cornfield [facility] 07/08/2019, 7:58 AM

## 2019-07-08 NOTE — TOC Progression Note (Signed)
Transition of Care [facility]) - Progression Note    Patient Details  Name: NIKKIE LIMING MRN: [id number] Date of Birth: May 04, 1955  Transition of Care [facility]) CM/SW [place], [place] Phone Number: 07/08/2019, 3:14 PM  Clinical Narrative:    Patient's son, Edwyna Ready accepted bed offer with [facility].   CSW contacted [facility], confirmed will to admit patient tomorrow morning.   RN informed   Thurmond Butts, MSW, [place] Clinical Social Worker   Expected Discharge Plan: Skilled Nursing Facility Barriers to Discharge: SNF Pending bed offer  Expected Discharge Plan and Services Expected Discharge Plan: [place] In-house Referral: Clinical Social Work     Living arrangements for the past 2 months: Single Family Home                                       Social Determinants of Health (SDOH) Interventions    Readmission Risk Interventions No flowsheet data found.

## 2019-07-08 NOTE — TOC Progression Note (Signed)
Transition of Care [facility]) - Progression Note    Patient Details  Name: Cassidy Smith MRN: [id number] Date of Birth: 10-Feb-1955  Transition of Care [facility]) CM/SW [place], [place] Phone Number: 07/08/2019, 2:04 PM  Clinical Narrative:     Edwyna Ready states they family is going to look at facility.  Thurmond Butts, MSW, [place] Clinical Social Worker   Expected Discharge Plan: Skilled Nursing Facility Barriers to Discharge: SNF Pending bed offer  Expected Discharge Plan and Services Expected Discharge Plan: [place] In-house Referral: Clinical Social Work     Living arrangements for the past 2 months: Single Family Home                                       Social Determinants of Health (SDOH) Interventions    Readmission Risk Interventions No flowsheet data found.

## 2019-07-08 NOTE — TOC Progression Note (Signed)
Transition of Care [facility] - Progression Note    Patient Details  Name: Cassidy Smith MRN: [id number] Date of Birth: 1955/02/20  Transition of Care [facility]) CM/SW [place], [place] Phone Number: 07/08/2019, 1:01 PM  Clinical Narrative:     CSW spoke with patient's son, Cassidy Smith. He states they are meeting with the family now and will get back with the CSW shortly.  Thurmond Butts, MSW, [place] Clinical Social Worker   Expected Discharge Plan: Skilled Nursing Facility Barriers to Discharge: SNF Pending bed offer  Expected Discharge Plan and Services Expected Discharge Plan: [place] In-house Referral: Clinical Social Work     Living arrangements for the past 2 months: Single Family Home                                       Social Determinants of Health (SDOH) Interventions    Readmission Risk Interventions No flowsheet data found.

## 2019-07-09 LAB — CBC
HCT: 34.4 % — ABNORMAL LOW (ref 36.0–46.0)
Hemoglobin: 11.5 g/dL — ABNORMAL LOW (ref 12.0–15.0)
MCH: 31.3 pg (ref 26.0–34.0)
MCHC: 33.4 g/dL (ref 30.0–36.0)
MCV: 93.7 fL (ref 80.0–100.0)
Platelets: 162 10*3/uL (ref 150–400)
RBC: 3.67 MIL/uL — ABNORMAL LOW (ref 3.87–5.11)
RDW: 15 % (ref 11.5–15.5)
WBC: 10.4 10*3/uL (ref 4.0–10.5)
nRBC: 0 % (ref 0.0–0.2)

## 2019-07-09 LAB — BASIC METABOLIC PANEL
Anion gap: 7 (ref 5–15)
BUN: 14 mg/dL (ref 8–23)
CO2: 28 mmol/L (ref 22–32)
Calcium: 9.6 mg/dL (ref 8.9–10.3)
Chloride: 99 mmol/L (ref 98–111)
Creatinine, Ser: 0.52 mg/dL (ref 0.44–1.00)
GFR calc Af Amer: >60 mL/min (ref >60)
GFR calc non Af Amer: >60 mL/min (ref >60)
Glucose, Bld: 108 mg/dL — ABNORMAL HIGH (ref 70–99)
Potassium: 3.7 mmol/L (ref 3.5–5.1)
Sodium: 134 mmol/L — ABNORMAL LOW (ref 135–145)

## 2019-07-09 NOTE — Progress Notes (Signed)
Report called to RN at [facility]. Pt's son aware of Pt's upcoming transfer to SNF.

## 2019-07-09 NOTE — Progress Notes (Signed)
  Speech Language Pathology Treatment: Dysphagia  Patient Details Name: Cassidy Smith MRN: [id number] DOB: 19-Dec-1955 Today's Date: 07/09/2019 Time: 6384-6659 SLP Time Calculation (min) (ACUTE ONLY): 9 min  Assessment / Plan / Recommendation Clinical Impression  Pt has less oral holding today compared to report from last SLP visit, but still needs frequent redirection to self-feeding task. Pt initially started drinking thin liquids via straw with throat clearing noted, but once SLP repositioned her to a fully upright posture, this was no longer observed. Upgraded trials of soft solids were provided too but took a prolonged amount of time for oral preparation given mentation and lack of dentition. Current diet remains appropriate. Will continue to follow.   HPI HPI: Cassidy Smith is an 64 y.o. female with h/o  CVA; bipolar; and dementia who presented as a transfer from [place] after a fall with SAH/IVH.        SLP Plan  Continue with current plan of care       Recommendations  Diet recommendations: Dysphagia 2 (fine chop);Thin liquid Liquids provided via: Cup;Straw Medication Administration: Whole meds with puree Supervision: Staff to assist with self feeding;Full supervision/cueing for compensatory strategies Compensations: Minimize environmental distractions;Slow rate;Small sips/bites Postural Changes and/or Swallow Maneuvers: Seated upright 90 degrees                Oral Care Recommendations: Oral care BID Follow up Recommendations: Skilled Nursing facility SLP Visit Diagnosis: Dysphagia, unspecified (R13.10) Plan: Continue with current plan of care       GO                Osie Bond., M.A. [place] Acute Rehabilitation Services Pager [phone] Office [phone]  07/09/2019, 10:01 AM

## 2019-07-09 NOTE — Progress Notes (Signed)
PTAR at bedside to to transfer pt to [facility]. 2 bags of belongings and 1 large bag of pt's medications given to PTAR to transport with Pt. Son called and informed of pt's transfer.

## 2019-07-09 NOTE — TOC Transition Note (Signed)
Transition of Care [facility]) - CM/SW Discharge Note   Patient Details  Name: Cassidy Smith MRN: [id number] Date of Birth: 1955-05-28  Transition of Care [facility]) CM/SW Contact:  Vinie Sill, [place] Phone Number: 07/09/2019, 1:00 PM   Clinical Narrative:     Patient will DC to: [facility]:85/27/7824 Family Notified: [name],son Transport MP:NTIR  Per MD patient is ready for discharge. RN, patient, and facility notified of DC. Discharge Summary sent to facility. RN given number for [phone],Room C27 bed 2. Ambulance transport requested for patient.   Clinical Social Worker signing off.  Thurmond Butts, MSW, [place] Clinical Social Worker    Final next level of care: Skilled Nursing Facility Barriers to Discharge: Barriers Resolved   Patient Goals and CMS Choice Patient states their goals for this hospitalization and ongoing recovery are:: per family"to return home"      Discharge Placement PASRR number recieved: 07/06/19            Patient chooses bed at:  [facility]) Patient to be transferred to facility by: [place] Name of family member notified: [name],son Patient and family notified of of transfer: 07/09/19  Discharge Plan and Services In-house Referral: Clinical Social Work                                   Social Determinants of Health ([place]) Interventions     Readmission Risk Interventions No flowsheet data found.

## 2019-07-09 NOTE — Progress Notes (Signed)
Occupational Therapy Treatment Patient Details Name: Cassidy Smith MRN: [id number] DOB: [birth year] Today's Date: 07/09/2019    History of present illness 64 year old female presented to [facility] ER this morning after a fall yesterday and altered mental status. CT scan shows a right temporal hemorrhagic contusion with subarachnoid hemorrhage and intraventricular hemorrhage into the posterior horns bilaterally.  No midline shift or mass-effect. PHMx: Bipolar, TIA, dementia   OT comments  Pt received supine in bed asleep upon arrival but easily arouses to name. Pt able to state name but follows commands inconsistently throughout session. Pt noted to laugh spontaneously during session and be resistant to movement. Pt completed bed mobility with MAX- total A +2. Pt sat EOB with MIN A - min guard. Attempted sit<>stand from EOB with RW with pt resistant to assist. Agree with DC plan below, will follow.   Follow Up Recommendations  SNF;Supervision/Assistance - 24 hour    Equipment Recommendations  Other (comment) (TBD next venue)    Recommendations for Other Services      Precautions / Restrictions Precautions Precautions: Fall Restrictions Weight Bearing Restrictions: No       Mobility Bed Mobility Overal bed mobility: Needs Assistance Bed Mobility: Supine to Sit;Sit to Supine     Supine to sit: Max assist;+2 for physical assistance Sit to supine: Total assist;+2 for physical assistance;HOB elevated   General bed mobility comments: pt intially assist therapist with sliding BLEs to EOB however inconsistently with command following, therefore utilized bed pad to transition pt to EOB. total A +2 to return pt to supine with elevated HOB as pt slightly resistant to return to supine  Transfers                 General transfer comment: unable d/t resistance and decreased ability to follow commands    Balance Overall balance assessment: Needs assistance Sitting-balance  support: Feet supported;Bilateral upper extremity supported Sitting balance-Leahy Scale: Fair Sitting balance - Comments: close MIN A with periods of min guard                                   ADL either performed or assessed with clinical judgement   ADL Overall ADL's : Needs assistance/impaired                           Toilet Transfer Details (indicate cue type and reason): unable to attempt         Functional mobility during ADLs: Moderate assistance (bed mobility only) General ADL Comments: pt continues to present cognitive deficits, pain, and decreased ability to care for self impacting pts ability to complete BADLs.     Vision       Perception     Praxis      Cognition Arousal/Alertness: Awake/alert Behavior During Therapy: Flat affect Overall Cognitive Status: History of cognitive impairments - at baseline                                 General Comments: pt stating name and following commands spontaneously. per chart review family reports this is her " new normal" pt did state " cone flowes" and pointed to window as pt noticed flower planted outside room        Exercises     Shoulder Instructions       General  Comments      Pertinent Vitals/ Pain       Pain Assessment: Faces Faces Pain Scale: No hurt  Home Living                                          Prior Functioning/Environment              Frequency  Min 2X/week        Progress Toward Goals  OT Goals(current goals can now be found in the care plan section)  Progress towards OT goals: Progressing toward goals  Acute Rehab OT Goals Patient Stated Goal: unable OT Goal Formulation: Patient unable to participate in goal setting Time For Goal Achievement: 07/19/19 Potential to Achieve Goals: Good  Plan Discharge plan remains appropriate;Frequency remains appropriate    Co-evaluation                 AM-PAC OT "6  Clicks" Daily Activity     Outcome Measure   Help from another person eating meals?: A Lot Help from another person taking care of personal grooming?: A Lot Help from another person toileting, which includes using toliet, bedpan, or urinal?: A Lot   Help from another person to put on and taking off regular upper body clothing?: A Lot Help from another person to put on and taking off regular lower body clothing?: A Lot 6 Click Score: 10    End of Session Equipment Utilized During Treatment: Gait belt;Rolling walker  OT Visit Diagnosis: Unsteadiness on feet (R26.81);Other abnormalities of gait and mobility (R26.89);History of falling (Z91.81);Other symptoms and signs involving cognitive function;Cognitive communication deficit (R41.841)   Activity Tolerance Other (comment) (limited by cognition and resistance to movement)   Patient Left in bed;with call bell/phone within reach;with bed alarm set   Nurse Communication Mobility status        Time: 6010-9323 OT Time Calculation (min): 16 min  Charges: OT General Charges $OT Visit: 1 Visit OT Treatments $Therapeutic Activity: 8-22 mins  Lanier Clam., COTA/L Acute Rehabilitation Services [phone]    Cassidy Smith 07/09/2019, 4:27 PM

## 2019-07-09 NOTE — Discharge Summary (Signed)
Physician Discharge Summary  Patient ID: Cassidy Smith MRN: [id number] DOB/AGE: Jun 13, 1955 64 y.o.  Admit date: 07/03/2019 Discharge date: 07/09/2019  Admission Diagnoses: [place]    Discharge Diagnoses: same   Discharged Condition: stable  Hospital Course: The patient was admitted on 07/03/2019  With a [facility]. Marland Kitchen The hospital course was routine. There were no complications. Pt had some H/A. No complaints new N/T/W. The patient remained afebrile with stable vital signs, and tolerated a regular diet. F/U head CT was stable. The patient continued to increase activities, and pain was well controlled with oral pain medications.   Consults: None  Significant Diagnostic Studies:  Results for orders placed or performed during the hospital encounter of 07/03/19  SARS Coronavirus 2 by RT PCR (hospital order, performed in [place] hospital lab) Nasopharyngeal Nasopharyngeal Swab   Specimen: Nasopharyngeal Swab  Result Value Ref Range   SARS Coronavirus 2 NEGATIVE NEGATIVE  MRSA PCR Screening   Specimen: Nasal Mucosa; Nasopharyngeal  Result Value Ref Range   MRSA by PCR NEGATIVE NEGATIVE  SARS CORONAVIRUS 2 (TAT 6-24 HRS) Nasopharyngeal Nasopharyngeal Swab   Specimen: Nasopharyngeal Swab  Result Value Ref Range   SARS Coronavirus 2 NEGATIVE NEGATIVE  Blood gas, venous (at [facility] and [facility], not at [facility])  Result Value Ref Range   FIO2 21.00    pH, Ven 7.435 (H) 7.25 - 7.43   pCO2, Ven 47.5 44 - 60 mmHg   pO2, Ven 33.7 32 - 45 mmHg   Bicarbonate 29.5 (H) 20.0 - 28.0 mmol/L   Acid-Base Excess 7.1 (H) 0.0 - 2.0 mmol/L   O2 Saturation 60.8 %   Patient temperature 36.5   CBC with Differential  Result Value Ref Range   WBC 8.0 4.0 - 10.5 K/uL   RBC 3.90 3.87 - 5.11 MIL/uL   Hemoglobin 12.3 12.0 - 15.0 g/dL   HCT 37.5 36 - 46 %   MCV 96.2 80.0 - 100.0 fL   MCH 31.5 26.0 - 34.0 pg   MCHC 32.8 30.0 - 36.0 g/dL   RDW 14.8 11.5 - 15.5 %   Platelets 88 (L) 150 - 400 K/uL   nRBC 0.0 0.0 - 0.2 %    Neutrophils Relative % 64 %   Neutro Abs 5.1 1.7 - 7.7 K/uL   Lymphocytes Relative 27 %   Lymphs Abs 2.2 0.7 - 4.0 K/uL   Monocytes Relative 9 %   Monocytes Absolute 0.7 0 - 1 K/uL   Eosinophils Relative 0 %   Eosinophils Absolute 0.0 0 - 0 K/uL   Basophils Relative 0 %   Basophils Absolute 0.0 0 - 0 K/uL   Immature Granulocytes 0 %   Abs Immature Granulocytes 0.03 0.00 - 0.07 K/uL  Basic metabolic panel  Result Value Ref Range   Sodium 137 135 - 145 mmol/L   Potassium 3.4 (L) 3.5 - 5.1 mmol/L   Chloride 99 98 - 111 mmol/L   CO2 29 22 - 32 mmol/L   Glucose, Bld 100 (H) 70 - 99 mg/dL   BUN 21 8 - 23 mg/dL   Creatinine, Ser 0.59 0.44 - 1.00 mg/dL   Calcium 9.8 8.9 - 10.3 mg/dL   GFR calc non Af Amer >60 >60 mL/min   GFR calc Af Amer >60 >60 mL/min   Anion gap 9 5 - 15  Urinalysis, Routine w reflex microscopic  Result Value Ref Range   Color, Urine AMBER (A) YELLOW   APPearance HAZY (A) CLEAR   Specific Gravity,  Urine 1.016 1.005 - 1.030   pH 7.0 5.0 - 8.0   Glucose, UA NEGATIVE NEGATIVE mg/dL   Hgb urine dipstick NEGATIVE NEGATIVE   Bilirubin Urine NEGATIVE NEGATIVE   Ketones, ur NEGATIVE NEGATIVE mg/dL   Protein, ur NEGATIVE NEGATIVE mg/dL   Nitrite NEGATIVE NEGATIVE   Leukocytes,Ua NEGATIVE NEGATIVE  HIV Antibody (routine testing w rflx)  Result Value Ref Range   HIV Screen 4th Generation wRfx Non Reactive Non Reactive  TSH  Result Value Ref Range   TSH 0.805 0.350 - 4.500 uIU/mL  T4, free  Result Value Ref Range   Free T4 0.69 0.61 - 1.12 ng/dL  Comprehensive metabolic panel  Result Value Ref Range   Sodium 132 (L) 135 - 145 mmol/L   Potassium 3.9 3.5 - 5.1 mmol/L   Chloride 98 98 - 111 mmol/L   CO2 27 22 - 32 mmol/L   Glucose, Bld 107 (H) 70 - 99 mg/dL   BUN 22 8 - 23 mg/dL   Creatinine, Ser 0.51 0.44 - 1.00 mg/dL   Calcium 9.4 8.9 - 10.3 mg/dL   Total Protein 5.9 (L) 6.5 - 8.1 g/dL   Albumin 2.6 (L) 3.5 - 5.0 g/dL   AST 21 15 - 41 U/L   ALT 9 0 - 44 U/L    Alkaline Phosphatase 66 38 - 126 U/L   Total Bilirubin 0.7 0.3 - 1.2 mg/dL   GFR calc non Af Amer >60 >60 mL/min   GFR calc Af Amer >60 >60 mL/min   Anion gap 7 5 - 15  Comprehensive metabolic panel  Result Value Ref Range   Sodium 133 (L) 135 - 145 mmol/L   Potassium 4.9 3.5 - 5.1 mmol/L   Chloride 100 98 - 111 mmol/L   CO2 29 22 - 32 mmol/L   Glucose, Bld 99 70 - 99 mg/dL   BUN 19 8 - 23 mg/dL   Creatinine, Ser 0.59 0.44 - 1.00 mg/dL   Calcium 9.1 8.9 - 10.3 mg/dL   Total Protein 5.7 (L) 6.5 - 8.1 g/dL   Albumin 2.5 (L) 3.5 - 5.0 g/dL   AST 31 15 - 41 U/L   ALT 13 0 - 44 U/L   Alkaline Phosphatase 63 38 - 126 U/L   Total Bilirubin 0.7 0.3 - 1.2 mg/dL   GFR calc non Af Amer >60 >60 mL/min   GFR calc Af Amer >60 >60 mL/min   Anion gap 4 (L) 5 - 15  CBC  Result Value Ref Range   WBC 8.4 4.0 - 10.5 K/uL   RBC 3.67 (L) 3.87 - 5.11 MIL/uL   Hemoglobin 11.5 (L) 12.0 - 15.0 g/dL   HCT 34.1 (L) 36 - 46 %   MCV 92.9 80.0 - 100.0 fL   MCH 31.3 26.0 - 34.0 pg   MCHC 33.7 30.0 - 36.0 g/dL   RDW 14.9 11.5 - 15.5 %   Platelets 119 (L) 150 - 400 K/uL   nRBC 0.0 0.0 - 0.2 %  Basic metabolic panel  Result Value Ref Range   Sodium 134 (L) 135 - 145 mmol/L   Potassium 3.7 3.5 - 5.1 mmol/L   Chloride 99 98 - 111 mmol/L   CO2 28 22 - 32 mmol/L   Glucose, Bld 108 (H) 70 - 99 mg/dL   BUN 14 8 - 23 mg/dL   Creatinine, Ser 0.52 0.44 - 1.00 mg/dL   Calcium 9.6 8.9 - 10.3 mg/dL   GFR calc non Af  Amer >60 >60 mL/min   GFR calc Af Amer >60 >60 mL/min   Anion gap 7 5 - 15  CBC  Result Value Ref Range   WBC 10.4 4.0 - 10.5 K/uL   RBC 3.67 (L) 3.87 - 5.11 MIL/uL   Hemoglobin 11.5 (L) 12.0 - 15.0 g/dL   HCT 34.4 (L) 36 - 46 %   MCV 93.7 80.0 - 100.0 fL   MCH 31.3 26.0 - 34.0 pg   MCHC 33.4 30.0 - 36.0 g/dL   RDW 15.0 11.5 - 15.5 %   Platelets 162 150 - 400 K/uL   nRBC 0.0 0.0 - 0.2 %  Troponin I (High Sensitivity)  Result Value Ref Range   Troponin I (High Sensitivity) 5 <18 ng/L   Troponin I (High Sensitivity)  Result Value Ref Range   Troponin I (High Sensitivity) 6 <18 ng/L    CT HEAD WO CONTRAST  Result Date: 07/04/2019 CLINICAL DATA:  Head trauma.  Intracranial hemorrhage. EXAM: CT HEAD WITHOUT CONTRAST TECHNIQUE: Contiguous axial images were obtained from the base of the skull through the vertex without intravenous contrast. COMPARISON:  CT head without contrast 07/03/2019. FINDINGS: Brain: Right temporal hemorrhagic contusion demonstrates expected evolution. Increasing surrounding edema is present. Effacement of the sulci is again noted. Scattered areas of subarachnoid hemorrhage again noted bilaterally, right greater than left. Blood is noted in the posterior horns of the lateral ventricles bilaterally. Ventricular enlargement is stable. Moderate white matter disease is unchanged. 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 Vascular: Vascular calcifications are evident. Skull: Calvarium is intact. No focal lytic or blastic lesions are present. No significant extracranial soft tissue lesion is present. Sinuses/Orbits: The paranasal sinuses and mastoid air cells are clear. The globes and orbits are within normal limits. IMPRESSION: 1. Expected evolution of right temporal hemorrhagic contusion with increasing surrounding edema. Local mass effect is present without midline shift. 2. Stable scattered areas of subarachnoid hemorrhage bilaterally, right greater than left. These are more visible on today's study, felt to be due to technical factors without new or progressive hemorrhage. 3. Stable ventriculomegaly.  Stable intraventricular hemorrhage. 4. Stable white matter disease. 5. Remote infarcts of the right cerebellum. Electronically Signed   By: San Morelle M.D.   On: 07/04/2019 12:00   CT Head Wo Contrast  Result Date: 07/03/2019 CLINICAL DATA:  Polytrauma, critical. Fall yesterday with altered mental status since. EXAM: CT HEAD WITHOUT CONTRAST CT  CERVICAL SPINE WITHOUT CONTRAST TECHNIQUE: Multidetector CT imaging of the head and cervical spine was performed following the standard protocol without intravenous contrast. Multiplanar CT image reconstructions of the cervical spine were also generated. COMPARISON:  CT head without contrast 08/14/2017. MR head without contrast 08/15/2017. MR cervical spine 06/11/2017. FINDINGS: CT HEAD FINDINGS Brain: Hemorrhagic contusion is noted in the right temporal tip. Adjacent subarachnoid hemorrhage is present. Intraventricular hemorrhage is seen layering in the posterior horns bilaterally. Associated edema contributes to effacement of the sulci in the anterior right temporal lobe. No significant midline shift is present. Ventricles are chronically dilated. No acute change or hydrocephalus is present. Periventricular white matter changes are stable. No ischemic infarcts are evident. 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 Vascular: Atherosclerotic calcifications are present within the cavernous internal carotid arteries. No hyperdense vessel is present. Skull: No acute fracture or extracranial soft tissue injury is evident. Sinuses/Orbits: The paranasal sinuses and mastoid air cells are clear. The globes and orbits are within normal limits. CT CERVICAL SPINE FINDINGS Alignment: Minimal  degenerative anterolisthesis is present at C3-4 and C4-5. Skull base and vertebrae: Craniocervical junction is normal. Vertebral body heights are maintained. No acute or healing fractures are present. Soft tissues and spinal canal: Atherosclerotic calcifications are present at the carotid bifurcations bilaterally, right greater than left. Soft tissues of the neck are otherwise unremarkable. No prevertebral fluid or swelling. No visible canal hematoma. No mass lesion or adenopathy. Disc levels: Uncovertebral and facet disease contribute to right greater than left foraminal narrowing at C3-4, C5-6. Left-sided disease is more  significant at C6-7 and C2-3. Upper chest: Lung apices are clear.  Thoracic inlet is normal. IMPRESSION: 1. Hemorrhagic contusion of the right temporal tip with adjacent subarachnoid hemorrhage. 2. Intraventricular hemorrhage layering in the posterior horns bilaterally. 3. Associated edema contributes to effacement of the sulci in the anterior right temporal lobe. No significant midline shift. 4. Stable atrophy and white matter disease. 5. 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 6. No acute fracture or traumatic subluxation in the cervical spine. 7. Multilevel degenerative changes of the cervical spine as described. Electronically Signed   By: San Morelle M.D.   On: 07/03/2019 11:41   CT Cervical Spine Wo Contrast  Result Date: 07/03/2019 CLINICAL DATA:  Polytrauma, critical. Fall yesterday with altered mental status since. EXAM: CT HEAD WITHOUT CONTRAST CT CERVICAL SPINE WITHOUT CONTRAST TECHNIQUE: Multidetector CT imaging of the head and cervical spine was performed following the standard protocol without intravenous contrast. Multiplanar CT image reconstructions of the cervical spine were also generated. COMPARISON:  CT head without contrast 08/14/2017. MR head without contrast 08/15/2017. MR cervical spine 06/11/2017. FINDINGS: CT HEAD FINDINGS Brain: Hemorrhagic contusion is noted in the right temporal tip. Adjacent subarachnoid hemorrhage is present. Intraventricular hemorrhage is seen layering in the posterior horns bilaterally. Associated edema contributes to effacement of the sulci in the anterior right temporal lobe. No significant midline shift is present. Ventricles are chronically dilated. No acute change or hydrocephalus is present. Periventricular white matter changes are stable. No ischemic infarcts are evident. 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 Vascular: Atherosclerotic calcifications are present within the cavernous internal carotid arteries. No  hyperdense vessel is present. Skull: No acute fracture or extracranial soft tissue injury is evident. Sinuses/Orbits: The paranasal sinuses and mastoid air cells are clear. The globes and orbits are within normal limits. CT CERVICAL SPINE FINDINGS Alignment: Minimal degenerative anterolisthesis is present at C3-4 and C4-5. Skull base and vertebrae: Craniocervical junction is normal. Vertebral body heights are maintained. No acute or healing fractures are present. Soft tissues and spinal canal: Atherosclerotic calcifications are present at the carotid bifurcations bilaterally, right greater than left. Soft tissues of the neck are otherwise unremarkable. No prevertebral fluid or swelling. No visible canal hematoma. No mass lesion or adenopathy. Disc levels: Uncovertebral and facet disease contribute to right greater than left foraminal narrowing at C3-4, C5-6. Left-sided disease is more significant at C6-7 and C2-3. Upper chest: Lung apices are clear.  Thoracic inlet is normal. IMPRESSION: 1. Hemorrhagic contusion of the right temporal tip with adjacent subarachnoid hemorrhage. 2. Intraventricular hemorrhage layering in the posterior horns bilaterally. 3. Associated edema contributes to effacement of the sulci in the anterior right temporal lobe. No significant midline shift. 4. Stable atrophy and white matter disease. 5. 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 6. No acute fracture or traumatic subluxation in the cervical spine. 7. Multilevel degenerative changes of the cervical spine as described. Electronically Signed   By: Wynetta Fines.D.  On: 07/03/2019 11:41   DG Chest Portable 1 View  Result Date: 07/03/2019 CLINICAL DATA:  Fall down stairs. EXAM: PORTABLE CHEST 1 VIEW COMPARISON:  April 06, 2019 FINDINGS: Skin folds are identified over the upper lateral right chest. No pneumothorax. The heart, hila, mediastinum, and pleura are normal. No fractures are identified. IMPRESSION: No  acute abnormalities. Electronically Signed   By: Dorise Bullion III M.D   On: 07/03/2019 11:31   DG HIP UNILAT WITH PELVIS 2-3 VIEWS RIGHT  Result Date: 07/03/2019 CLINICAL DATA:  Right hip pain after fall EXAM: DG HIP (WITH OR WITHOUT PELVIS) 2-3V RIGHT COMPARISON:  06/16/2017 FINDINGS: Postsurgical changes from prior right hip ORIF with IM nail and screw fixation. No perihardware lucency or fracture. Similar degree of heterotopic ossification adjacent to the lesser trochanter and superior to the greater trochanter. Hip joint is intact without dislocation. Pelvic bony ring is intact. Vascular calcifications are noted. IMPRESSION: 1. No acute osseous abnormality, right hip. 2. Status post right hip ORIF without evidence of hardware complication. Electronically Signed   By: Davina Poke D.O.   On: 07/03/2019 11:34    Antibiotics:  Anti-infectives (From admission, onward)   None      Discharge Exam: Blood pressure 123/69, pulse 71, temperature 99 F (37.2 C), temperature source Axillary, resp. rate 15, height 5\' 6"  (1.676 m), weight 49.1 kg, SpO2 95 %. Neurologic: Grossly normal   Discharge Medications:   Allergies as of 07/09/2019   No Known Allergies     Medication List    STOP taking these medications   sodium chloride 1 g tablet     TAKE these medications   aspirin EC 81 MG tablet Take 81 mg by mouth daily. Swallow whole.   atorvastatin 20 MG tablet Commonly known as: LIPITOR Take 1 tablet (20 mg total) by mouth daily at 6 PM.   benztropine 1 MG tablet Commonly known as: COGENTIN Take 1 tablet (1 mg total) by mouth 2 (two) times daily.   Citracal Maximum Plus Tabs Take 1 tablet by mouth daily. Per tablet: Vitamin D3 500 I.U., Calcium 325 mg, Zinc 2.75 mg, copper 0.225 mg, manganese 0.575 mg, sodium 2.5 mg   divalproex 500 MG 24 hr tablet Commonly known as: DEPAKOTE ER Take 2 tablets (1,000 mg total) by mouth at bedtime. What changed: how much to take    furosemide 20 MG tablet Commonly known as: LASIX Take 0.5 tablets (10 mg total) by mouth every other day. What changed: when to take this   gabapentin 100 MG capsule Commonly known as: NEURONTIN Take 1 capsule (100 mg total) by mouth 3 (three) times daily. What changed:   how much to take  when to take this  additional instructions   methimazole 10 MG tablet Commonly known as: TAPAZOLE Take 10 mg by mouth every other day.   multivitamin with minerals Tabs tablet Take 1 tablet by mouth daily. Centrum Silver   pantoprazole 40 MG tablet Commonly known as: PROTONIX Take 40 mg by mouth daily.   potassium chloride 10 MEQ tablet Commonly known as: KLOR-CON Take 1 tablet (10 mEq total) by mouth every other day. What changed: when to take this   risperiDONE 1 MG tablet Commonly known as: RISPERDAL Take 1 tablet (1 mg total) by mouth at bedtime as needed for up to 15 days (agitation).   risperiDONE 2 MG tablet Commonly known as: RISPERDAL Take 2 mg by mouth at bedtime.   traZODone 100 MG tablet Commonly known  as: DESYREL Take 100 mg by mouth at bedtime.   vitamin B-12 1000 MCG tablet Commonly known as: CYANOCOBALAMIN Take 1 tablet (1,000 mcg total) by mouth daily.   Vitamin D-3 125 MCG (5000 UT) Tabs Take 5,000 Units by mouth daily.       Disposition: SNF   Final Dx: [facility]  Discharge Instructions    Call MD for:  persistant nausea and vomiting   Complete by: As directed    Call MD for:  severe uncontrolled pain   Complete by: As directed    Call MD for:  temperature >100.4   Complete by: As directed    Diet - low sodium heart healthy   Complete by: As directed    Increase activity slowly   Complete by: As directed        Follow-up Information    Eustace Moore, MD. Schedule an appointment as soon as possible for a visit in 2 week(s).   Specialty: Neurosurgery Contact information: [address] [place] [address] [place]  [address] [phone]                Signed: Eustace Moore 07/09/2019, 11:32 AM

## 2019-07-29 ENCOUNTER — Other Ambulatory Visit (HOSPITAL_COMMUNITY): Payer: Self-pay | Admitting: Student

## 2019-07-29 ENCOUNTER — Other Ambulatory Visit: Payer: Self-pay | Admitting: Student

## 2019-07-29 DIAGNOSIS — S0990XA Unspecified injury of head, initial encounter: Secondary | ICD-10-CM

## 2019-08-20 ENCOUNTER — Encounter (HOSPITAL_COMMUNITY): Payer: Self-pay

## 2019-08-20 ENCOUNTER — Ambulatory Visit (HOSPITAL_COMMUNITY): Payer: Medicare Other

## 2019-11-06 ENCOUNTER — Other Ambulatory Visit: Payer: Self-pay

## 2019-11-06 ENCOUNTER — Emergency Department (HOSPITAL_COMMUNITY): Payer: Medicare Other

## 2019-11-06 ENCOUNTER — Inpatient Hospital Stay (HOSPITAL_COMMUNITY): Payer: Medicare Other

## 2019-11-06 ENCOUNTER — Inpatient Hospital Stay (HOSPITAL_COMMUNITY)
Admission: EM | Admit: 2019-11-06 | Discharge: 2019-11-08 | DRG: 871 | Disposition: A | Discharge status: Home Health | Payer: Medicare Other | Attending: Internal Medicine | Admitting: Internal Medicine

## 2019-11-06 DIAGNOSIS — Z85048 Personal history of other malignant neoplasm of rectum, rectosigmoid junction, and anus: Secondary | ICD-10-CM | POA: Diagnosis not present

## 2019-11-06 DIAGNOSIS — J189 Pneumonia, unspecified organism: Secondary | ICD-10-CM | POA: Diagnosis present

## 2019-11-06 DIAGNOSIS — R5381 Other malaise: Secondary | ICD-10-CM | POA: Diagnosis present

## 2019-11-06 DIAGNOSIS — Z7982 Long term (current) use of aspirin: Secondary | ICD-10-CM

## 2019-11-06 DIAGNOSIS — Z7189 Other specified counseling: Secondary | ICD-10-CM

## 2019-11-06 DIAGNOSIS — R1310 Dysphagia, unspecified: Secondary | ICD-10-CM | POA: Diagnosis present

## 2019-11-06 DIAGNOSIS — E785 Hyperlipidemia, unspecified: Secondary | ICD-10-CM | POA: Diagnosis present

## 2019-11-06 DIAGNOSIS — Z86718 Personal history of other venous thrombosis and embolism: Secondary | ICD-10-CM | POA: Diagnosis not present

## 2019-11-06 DIAGNOSIS — A419 Sepsis, unspecified organism: Principal | ICD-10-CM | POA: Diagnosis present

## 2019-11-06 DIAGNOSIS — Z87891 Personal history of nicotine dependence: Secondary | ICD-10-CM | POA: Diagnosis not present

## 2019-11-06 DIAGNOSIS — F419 Anxiety disorder, unspecified: Secondary | ICD-10-CM | POA: Diagnosis present

## 2019-11-06 DIAGNOSIS — Z79899 Other long term (current) drug therapy: Secondary | ICD-10-CM

## 2019-11-06 DIAGNOSIS — Z803 Family history of malignant neoplasm of breast: Secondary | ICD-10-CM

## 2019-11-06 DIAGNOSIS — R4182 Altered mental status, unspecified: Secondary | ICD-10-CM

## 2019-11-06 DIAGNOSIS — E0590 Thyrotoxicosis, unspecified without thyrotoxic crisis or storm: Secondary | ICD-10-CM | POA: Diagnosis present

## 2019-11-06 DIAGNOSIS — Z811 Family history of alcohol abuse and dependence: Secondary | ICD-10-CM | POA: Diagnosis not present

## 2019-11-06 DIAGNOSIS — Z87440 Personal history of urinary (tract) infections: Secondary | ICD-10-CM

## 2019-11-06 DIAGNOSIS — Z20822 Contact with and (suspected) exposure to covid-19: Secondary | ICD-10-CM | POA: Diagnosis present

## 2019-11-06 DIAGNOSIS — G629 Polyneuropathy, unspecified: Secondary | ICD-10-CM | POA: Diagnosis present

## 2019-11-06 DIAGNOSIS — Z66 Do not resuscitate: Secondary | ICD-10-CM | POA: Diagnosis present

## 2019-11-06 DIAGNOSIS — K219 Gastro-esophageal reflux disease without esophagitis: Secondary | ICD-10-CM | POA: Diagnosis present

## 2019-11-06 DIAGNOSIS — F0390 Unspecified dementia without behavioral disturbance: Secondary | ICD-10-CM | POA: Diagnosis present

## 2019-11-06 DIAGNOSIS — G9341 Metabolic encephalopathy: Secondary | ICD-10-CM | POA: Diagnosis present

## 2019-11-06 DIAGNOSIS — R6520 Severe sepsis without septic shock: Secondary | ICD-10-CM | POA: Diagnosis not present

## 2019-11-06 DIAGNOSIS — F319 Bipolar disorder, unspecified: Secondary | ICD-10-CM | POA: Diagnosis present

## 2019-11-06 DIAGNOSIS — J9601 Acute respiratory failure with hypoxia: Secondary | ICD-10-CM | POA: Diagnosis present

## 2019-11-06 DIAGNOSIS — Z515 Encounter for palliative care: Secondary | ICD-10-CM

## 2019-11-06 DIAGNOSIS — Z8673 Personal history of transient ischemic attack (TIA), and cerebral infarction without residual deficits: Secondary | ICD-10-CM | POA: Diagnosis not present

## 2019-11-06 DIAGNOSIS — I10 Essential (primary) hypertension: Secondary | ICD-10-CM | POA: Diagnosis present

## 2019-11-06 HISTORY — DX: Pneumonia, unspecified organism: J18.9

## 2019-11-06 LAB — CBC WITH DIFFERENTIAL/PLATELET
Abs Immature Granulocytes: 0.03 10*3/uL (ref 0.00–0.07)
Basophils Absolute: 0 10*3/uL (ref 0.0–0.1)
Basophils Relative: 0 %
Eosinophils Absolute: 0.3 10*3/uL (ref 0.0–0.5)
Eosinophils Relative: 4 %
HCT: 38 % (ref 36.0–46.0)
Hemoglobin: 12.6 g/dL (ref 12.0–15.0)
Immature Granulocytes: 0 %
Lymphocytes Relative: 18 %
Lymphs Abs: 1.5 10*3/uL (ref 0.7–4.0)
MCH: 30.7 pg (ref 26.0–34.0)
MCHC: 33.2 g/dL (ref 30.0–36.0)
MCV: 92.7 fL (ref 80.0–100.0)
Monocytes Absolute: 0.9 10*3/uL (ref 0.1–1.0)
Monocytes Relative: 11 %
Neutro Abs: 5.6 10*3/uL (ref 1.7–7.7)
Neutrophils Relative %: 67 %
Platelets: 89 10*3/uL — ABNORMAL LOW (ref 150–400)
RBC: 4.1 MIL/uL (ref 3.87–5.11)
RDW: 15.2 % (ref 11.5–15.5)
WBC: 8.4 10*3/uL (ref 4.0–10.5)
nRBC: 0 % (ref 0.0–0.2)

## 2019-11-06 LAB — COMPREHENSIVE METABOLIC PANEL
ALT: 11 U/L (ref 0–44)
AST: 19 U/L (ref 15–41)
Albumin: 3.6 g/dL (ref 3.5–5.0)
Alkaline Phosphatase: 82 U/L (ref 38–126)
Anion gap: 11 (ref 5–15)
BUN: 19 mg/dL (ref 8–23)
CO2: 29 mmol/L (ref 22–32)
Calcium: 9.8 mg/dL (ref 8.9–10.3)
Chloride: 99 mmol/L (ref 98–111)
Creatinine, Ser: 0.75 mg/dL (ref 0.44–1.00)
GFR, Estimated: >60 mL/min (ref >60)
Glucose, Bld: 108 mg/dL — ABNORMAL HIGH (ref 70–99)
Potassium: 3.6 mmol/L (ref 3.5–5.1)
Sodium: 139 mmol/L (ref 135–145)
Total Bilirubin: 0.7 mg/dL (ref 0.3–1.2)
Total Protein: 7.2 g/dL (ref 6.5–8.1)

## 2019-11-06 LAB — URINALYSIS, ROUTINE W REFLEX MICROSCOPIC
Bilirubin Urine: NEGATIVE
Glucose, UA: NEGATIVE mg/dL
Hgb urine dipstick: NEGATIVE
Ketones, ur: 5 mg/dL — AB
Leukocytes,Ua: NEGATIVE
Nitrite: NEGATIVE
Protein, ur: NEGATIVE mg/dL
Specific Gravity, Urine: 1.01 (ref 1.005–1.030)
pH: 8 (ref 5.0–8.0)

## 2019-11-06 LAB — LACTIC ACID, PLASMA: Lactic Acid, Venous: 1 mmol/L (ref 0.5–1.9)

## 2019-11-06 LAB — TSH: TSH: 1.216 u[IU]/mL (ref 0.350–4.500)

## 2019-11-06 LAB — APTT: aPTT: 28 seconds (ref 24–36)

## 2019-11-06 LAB — PROTIME-INR
INR: 1 (ref 0.8–1.2)
Prothrombin Time: 12.6 seconds (ref 11.4–15.2)

## 2019-11-06 LAB — RESPIRATORY PANEL BY RT PCR (FLU A&B, COVID)
Influenza A by PCR: NEGATIVE
Influenza B by PCR: NEGATIVE
SARS Coronavirus 2 by RT PCR: NEGATIVE

## 2019-11-06 LAB — AMMONIA: Ammonia: 22 umol/L (ref 9–35)

## 2019-11-06 LAB — VALPROIC ACID LEVEL: Valproic Acid Lvl: 81 ug/mL (ref 50.0–100.0)

## 2019-11-06 MED ORDER — ACETAMINOPHEN 325 MG PO TABS: 650.0000 mg | ORAL_TABLET | Freq: Four times a day (QID) | ORAL | Status: DC | PRN

## 2019-11-06 MED ORDER — RISPERIDONE 1 MG PO TABS
2.0000 mg | ORAL_TABLET | Freq: Every day | ORAL | Status: DC
  Administered 2019-11-07: 2 mg via ORAL
  Filled 2019-11-06: qty 2

## 2019-11-06 MED ORDER — GABAPENTIN 100 MG PO CAPS
200.0000 mg | ORAL_CAPSULE | Freq: Every day | ORAL | Status: DC
  Administered 2019-11-07: 200 mg via ORAL

## 2019-11-06 MED ORDER — SODIUM CHLORIDE 0.9 % IV SOLN
2.0000 g | Freq: Once | INTRAVENOUS | Status: AC
  Administered 2019-11-06: 2 g via INTRAVENOUS
  Filled 2019-11-06: qty 2

## 2019-11-06 MED ORDER — LEVALBUTEROL HCL 0.63 MG/3ML IN NEBU
0.6300 mg | INHALATION_SOLUTION | Freq: Three times a day (TID) | RESPIRATORY_TRACT | Status: DC
  Administered 2019-11-07 – 2019-11-08 (×4): 0.63 mg via RESPIRATORY_TRACT
  Filled 2019-11-06 (×5): qty 3

## 2019-11-06 MED ORDER — VANCOMYCIN HCL IN DEXTROSE 1-5 GM/200ML-% IV SOLN
1000.0000 mg | Freq: Once | INTRAVENOUS | Status: AC
  Administered 2019-11-06: 1000 mg via INTRAVENOUS
  Filled 2019-11-06: qty 200

## 2019-11-06 MED ORDER — GABAPENTIN 100 MG PO CAPS: 100.0000 mg | ORAL_CAPSULE | Freq: Three times a day (TID) | ORAL | Status: DC

## 2019-11-06 MED ORDER — VANCOMYCIN HCL IN DEXTROSE 1-5 GM/200ML-% IV SOLN
1000.0000 mg | INTRAVENOUS | Status: DC
  Administered 2019-11-07: 1000 mg via INTRAVENOUS
  Filled 2019-11-06: qty 200

## 2019-11-06 MED ORDER — DIVALPROEX SODIUM ER 500 MG PO TB24: 1000.0000 mg | ORAL_TABLET | Freq: Every day | ORAL | Status: DC

## 2019-11-06 MED ORDER — ASPIRIN EC 81 MG PO TBEC
81.0000 mg | DELAYED_RELEASE_TABLET | Freq: Every day | ORAL | Status: DC
  Administered 2019-11-07 – 2019-11-08 (×2): 81 mg via ORAL
  Filled 2019-11-06 (×2): qty 1

## 2019-11-06 MED ORDER — KCL-LACTATED RINGERS-D5W 20 MEQ/L IV SOLN
INTRAVENOUS | Status: DC
  Filled 2019-11-06 (×4): qty 1000

## 2019-11-06 MED ORDER — LEVALBUTEROL HCL 0.63 MG/3ML IN NEBU
0.6300 mg | INHALATION_SOLUTION | Freq: Four times a day (QID) | RESPIRATORY_TRACT | Status: DC | PRN
  Administered 2019-11-08: 0.63 mg via RESPIRATORY_TRACT

## 2019-11-06 MED ORDER — GUAIFENESIN ER 600 MG PO TB12
1200.0000 mg | ORAL_TABLET | Freq: Two times a day (BID) | ORAL | Status: DC
  Administered 2019-11-07 – 2019-11-08 (×4): 1200 mg via ORAL
  Filled 2019-11-06 (×4): qty 2

## 2019-11-06 MED ORDER — AMLODIPINE BESYLATE 5 MG PO TABS
2.5000 mg | ORAL_TABLET | Freq: Every day | ORAL | Status: DC
  Administered 2019-11-07 – 2019-11-08 (×2): 2.5 mg via ORAL
  Filled 2019-11-06 (×2): qty 1

## 2019-11-06 MED ORDER — PANTOPRAZOLE SODIUM 40 MG PO TBEC
40.0000 mg | DELAYED_RELEASE_TABLET | Freq: Every day | ORAL | Status: DC
  Administered 2019-11-07 – 2019-11-08 (×2): 40 mg via ORAL
  Filled 2019-11-06 (×2): qty 1

## 2019-11-06 MED ORDER — LACTATED RINGERS IV BOLUS (SEPSIS)
1000.0000 mL | Freq: Once | INTRAVENOUS | Status: AC
  Administered 2019-11-06: 1000 mL via INTRAVENOUS

## 2019-11-06 MED ORDER — METRONIDAZOLE IN NACL 5-0.79 MG/ML-% IV SOLN
500.0000 mg | Freq: Once | INTRAVENOUS | Status: AC
  Administered 2019-11-06: 500 mg via INTRAVENOUS
  Filled 2019-11-06: qty 100

## 2019-11-06 MED ORDER — SODIUM CHLORIDE 0.9 % IV SOLN
2.0000 g | Freq: Two times a day (BID) | INTRAVENOUS | Status: DC
  Administered 2019-11-07 – 2019-11-08 (×4): 2 g via INTRAVENOUS
  Filled 2019-11-06 (×4): qty 2

## 2019-11-06 MED ORDER — LACTATED RINGERS IV SOLN
INTRAVENOUS | Status: DC
  Administered 2019-11-06: 1000 mL via INTRAVENOUS

## 2019-11-06 MED ORDER — GABAPENTIN 100 MG PO CAPS
100.0000 mg | ORAL_CAPSULE | Freq: Every day | ORAL | Status: DC
  Filled 2019-11-06: qty 1

## 2019-11-06 MED ORDER — LEVALBUTEROL HCL 0.63 MG/3ML IN NEBU: 0.6300 mg | INHALATION_SOLUTION | Freq: Three times a day (TID) | RESPIRATORY_TRACT | Status: DC

## 2019-11-06 MED ORDER — ATORVASTATIN CALCIUM 20 MG PO TABS
20.0000 mg | ORAL_TABLET | Freq: Every day | ORAL | Status: DC
  Administered 2019-11-07: 20 mg via ORAL
  Filled 2019-11-06: qty 1

## 2019-11-06 NOTE — ED Provider Notes (Signed)
[facility] Provider Note   CSN: [id number] Arrival date & time: 11/06/19  1725     History Chief Complaint  Patient presents with  . Altered Mental Status    Cassidy Smith is a 64 y.o. female   Presents for evaluation of altered mental status.  Per triage note patient from home.  Has 24-hour care.  Last week patient has become increasingly more altered.  She is prone to UTIs.  Caregiver had noted foul odor to urine.  Patient was found shaking earlier today.  Responds to her name however nothing else.  Warm to touch.  Patient altered on arrival.  Will open eyes to name and pain.  Will occasionally follow commands over falls back to sleep.  Level 5 caveat-altered mental status  HPI     Past Medical History:  Diagnosis Date  . Anxiety   . Bipolar 1 disorder ([place])   . Chronic hip pain   . Chronic kidney disease    uti  currently,  hx bladder spasms  . Depression   . Headache(784.0)   . Hyperthyroidism   . Ischemic stroke ([place])   . Neuromuscular disorder (H[facility])    shaking of hands   . 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 CAP (community acquired pneumonia) 11/06/2019  . Intracerebral hematoma ([place]) 07/04/2019  . Closed head injury 07/03/2019  . Thrombocytopenia ([place]) 07/03/2019  . UTI (urinary tract infection) 01/04/2018  . Neuromuscular disorder ([place])   . Dysuria 10/30/2017  . Fever 08/15/2017  . Hypokalemia 08/15/2017  . Altered mental state 08/15/2017  . Acute metabolic encephalopathy 28/36/6294  . Valproic acid toxicity 08/15/2017  . Altered mental status   . Ischemic stroke ([place])   . GERD (gastroesophageal reflux disease) 05/12/2017  . GI bleed   . 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 Dysplastic rectal polyp   . Protein-calorie malnutrition, severe 04/07/2017  . Pressure injury of skin 04/04/2017  . Anticoagulated  04/03/2017  . Stool bloody 04/03/2017  . Current every day smoker 04/03/2017  . Rectal bleeding   . Chronic diarrhea   . Hyperthyroidism   . S/P ORIF (open reduction internal fixation) fracture right hip IM nail 03/07/17 03/24/2017  . Hypomagnesemia 03/07/2017  . Fall   . Closed intertrochanteric fracture of hip, right, initial encounter ([place]) 03/06/2017  . Radial styloid fracture: right 03/06/2017  . Anemia 03/06/2017  . Orthostatic hypotension 06/07/2015  . Lower urinary tract infectious disease 06/07/2015  . Rhabdomyolysis 06/07/2015  . White matter abnormality on MRI of brain 05/14/2013  . History of depression 05/14/2013  . Hx of anxiety disorder 05/14/2013  . Bipolar I disorder, most recent episode (or current) manic, unspecified 05/14/2013  . Bipolar 1 disorder ([place]) 05/14/2013  . Hyponatremia 05/12/2013  . Bipolar disorder ([place]) 05/12/2013  . Lacunar infarct, acute ([place]) 05/12/2013    Past Surgical History:  Procedure Laterality Date  . COLONOSCOPY WITH PROPOFOL N/A 04/05/2017   Procedure: COLONOSCOPY WITH PROPOFOL;  Surgeon: Rogene Houston, MD;  Location: [facility];  Service: Endoscopy;  Laterality: N/A;  . INTRAMEDULLARY (IM) NAIL INTERTROCHANTERIC Right 03/07/2017   Procedure: OPEN TREATMENT INTERNAL FIXATION RIGHT HIP WITH GAMA INTRAMEDULARY NAIL;  Surgeon: Carole Civil, MD;  Location: [facility];  Service: Orthopedics;  Laterality: Right;  . MULTIPLE EXTRACTIONS WITH ALVEOLOPLASTY N/A 10/30/2012   Procedure: MULTIPLE EXTRACION 5, 6, 8, 9, 10 ,  18, 19, 31 WITH MAXILLARY RIGHT AND LEFT  ALVEOLOPLASTY REDUCE MAXILLARY LEFT TUBEROSITY;  Surgeon: Gae Bon, DDS;  Location: [facility];  Service: Oral Surgery;  Laterality: N/A;  . POLYPECTOMY  04/05/2017   Procedure: POLYPECTOMY;  Surgeon: Rogene Houston, MD;  Location: [facility];  Service: Endoscopy;;  recto-sigmoid, rectum     OB History   No obstetric history on file.     Family History  Problem Relation  Age of Onset  . Breast cancer Mother   . Cancer - Other Mother   . Alcoholism Father   . Colon cancer Neg Hx   . Colon polyps Neg Hx     Social History   Tobacco Use  . Smoking status: Former Smoker    Packs/day: 0.50    Years: 20.00    Pack years: 10.00    Types: Cigarettes    Quit date: 05/05/2017    Years since quitting: 2.5  . Smokeless tobacco: Never Used  . Tobacco comment: patient states she quit one week ago  Vaping Use  . Vaping Use: Never used  Substance Use Topics  . Alcohol use: No  . Drug use: No    Home Medications Prior to Admission medications   Medication Sig Start Date End Date Taking? Authorizing Provider  aspirin EC 81 MG tablet Take 81 mg by mouth daily. Swallow whole.   Yes [provider]  atorvastatin (LIPITOR) 20 MG tablet Take 1 tablet (20 mg total) by mouth daily at 6 PM. 06/11/17  Yes Barton Dubois, MD  Cholecalciferol (VITAMIN D-3) 125 MCG (5000 UT) TABS Take 5,000 Units by mouth daily.   Yes [provider]  divalproex (DEPAKOTE ER) 500 MG 24 hr tablet Take 2 tablets (1,000 mg total) by mouth at bedtime. Patient taking differently: Take 1,500 mg by mouth at bedtime.  08/16/17  Yes Tat, Shanon Brow, MD  furosemide (LASIX) 20 MG tablet Take 0.5 tablets (10 mg total) by mouth every other day. Patient taking differently: Take 10 mg by mouth daily.  01/08/18  Yes Johnson, Clanford L, MD  gabapentin (NEURONTIN) 100 MG capsule Take 1 capsule (100 mg total) by mouth 3 (three) times daily. Patient taking differently: Take 100-200 mg by mouth See admin instructions. Take one capsule (100 mg) by mouth every morning and two capsules (200 mg) at night 05/13/17  Yes Carole Civil, MD  Multiple Vitamin (MULTIVITAMIN WITH MINERALS) TABS tablet Take 1 tablet by mouth daily. Centrum Silver   Yes [provider]  pantoprazole (PROTONIX) 40 MG tablet Take 40 mg by mouth daily.   Yes [provider]  potassium chloride (K-DUR,KLOR-CON)  10 MEQ tablet Take 1 tablet (10 mEq total) by mouth every other day. Patient taking differently: Take 10 mEq by mouth daily.  01/08/18  Yes Johnson, Clanford L, MD  risperiDONE (RISPERDAL) 2 MG tablet Take 2 mg by mouth at bedtime. 06/21/19  Yes [provider]  traZODone (DESYREL) 100 MG tablet Take 100 mg by mouth at bedtime.    Yes [provider]  benztropine (COGENTIN) 1 MG tablet Take 1 tablet (1 mg total) by mouth 2 (two) times daily. Patient not taking: Reported on 11/06/2019 01/05/18   Murlean Iba, MD  cefUROXime (CEFTIN) 500 MG tablet Take 500 mg by mouth 2 (two) times daily. Patient not taking: Reported on 11/06/2019 08/18/19   [provider]  potassium chloride (KLOR-CON) 10 MEQ tablet Take 10 mEq by mouth daily. Patient not taking:  Reported on 11/06/2019 10/19/19   [provider]  risperiDONE (RISPERDAL) 1 MG tablet Take 1 tablet (1 mg total) by mouth at bedtime as needed for up to 15 days (agitation). Patient not taking: Reported on 07/03/2019 01/05/18 01/20/18  Murlean Iba, MD  vitamin B-12 (CYANOCOBALAMIN) 1000 MCG tablet Take 1 tablet (1,000 mcg total) by mouth daily. Patient not taking: Reported on 11/06/2019 06/11/17   Barton Dubois, MD    Allergies    Patient has no known allergies.  Review of Systems   Review of Systems  Unable to perform ROS: Mental status change    Physical Exam Updated Vital Signs BP (!) 143/70   Pulse 81   Temp (!) 101.3 F (38.5 C) (Rectal)   Resp (!) 23   Ht 5\' 6"  (1.676 m)   Wt 50 kg   SpO2 100%   BMI 17.79 kg/m   Physical Exam Vitals and nursing note reviewed.  Constitutional:      Appearance: She is well-developed. She is ill-appearing and toxic-appearing. She is not diaphoretic.  HENT:     Head: Normocephalic and atraumatic.     Nose: Nose normal.     Mouth/Throat:     Mouth: Mucous membranes are dry.  Eyes:     Pupils: Pupils are equal, round, and reactive to light.    Cardiovascular:     Rate and Rhythm: Regular rhythm. Tachycardia present.     Pulses: Normal pulses.     Heart sounds: Normal heart sounds.  Pulmonary:     Effort: Pulmonary effort is normal. No respiratory distress.  Abdominal:     General: There is no distension.     Palpations: Abdomen is soft. There is no mass.     Tenderness: There is no abdominal tenderness. There is no right CVA tenderness, left CVA tenderness, guarding or rebound.  Musculoskeletal:        General: Normal range of motion.     Cervical back: Normal range of motion and neck supple.     Comments: Spontaneously moves all 4 extremities without difficulty.    Skin:    General: Skin is warm and dry.     Comments: Warm to touch.  No erythema.  Neurological:     Comments: Opens eyes to name.  Unable to state name, date of birth. Moves all 4 extremities spontaneously, occasionally shakes to right upper extremity however will open eyes and move extremities at this time.  Self resolves. Opens mouth and sticks out tongue which is midline Equal handgrip bilaterally however weak 3/5 Wiggles toes No facial droop PERRLA    ED Results / Procedures / Treatments   Labs (all labs ordered are listed, but only abnormal results are displayed) Labs Reviewed  URINALYSIS, ROUTINE W REFLEX MICROSCOPIC - Abnormal; Notable for the following components:      Result Value   Ketones, ur 5 (*)    All other components within normal limits  COMPREHENSIVE METABOLIC PANEL - Abnormal; Notable for the following components:   Glucose, Bld 108 (*)    All other components within normal limits  CBC WITH DIFFERENTIAL/PLATELET - Abnormal; Notable for the following components:   Platelets 89 (*)    All other components within normal limits  RESPIRATORY PANEL BY RT PCR (FLU A&B, COVID)  CULTURE, BLOOD (ROUTINE X 2)  CULTURE, BLOOD (ROUTINE X 2)  URINE CULTURE  MRSA PCR SCREENING  EXPECTORATED SPUTUM ASSESSMENT W REFEX TO RESP CULTURE  LACTIC  ACID, PLASMA  PROTIME-INR  APTT  AMMONIA  VALPROIC ACID LEVEL  TSH  CBC WITH DIFFERENTIAL/PLATELET  COMPREHENSIVE METABOLIC PANEL  PROCALCITONIN  STREP PNEUMONIAE URINARY ANTIGEN    EKG EKG Interpretation  Date/Time:  Saturday November 06 2019 17:47:42 EDT Ventricular Rate:  101 PR Interval:    QRS Duration: 95 QT Interval:  365 QTC Calculation: 474 R Axis:   37 Text Interpretation: Sinus tachycardia Nonspecific T wave abnormality Artifact Confirmed by Lajean Saver [phone]) on 11/06/2019 5:54:55 PM   Radiology CT HEAD WO CONTRAST  Result Date: 11/06/2019 CLINICAL DATA:  Altered mental status EXAM: CT HEAD WITHOUT CONTRAST TECHNIQUE: Contiguous axial images were obtained from the base of the skull through the vertex without intravenous contrast. COMPARISON:  07/04/2019 FINDINGS: Brain: Previously seen parenchymal hemorrhage in the left temporal lobe has nearly completely resolved. Mild chronic white matter ischemic changes are noted. No findings to suggest acute hemorrhage, acute infarction or space-occupying mass lesion are noted. Mild atrophic changes are noted as well. Vascular: No hyperdense vessel or unexpected calcification. Skull: Normal. Negative for fracture or focal lesion. Sinuses/Orbits: No acute finding. Other: None. IMPRESSION: Previously seen parenchymal hemorrhage in the right temporal lobe and subarachnoid hemorrhage has nearly completely resolved in the interval. No new hemorrhage is noted. 7 Chronic atrophic and ischemic changes. Electronically Signed   By: Inez Catalina M.D.   On: 11/06/2019 21:53   DG Chest Port 1 View  Result Date: 11/06/2019 CLINICAL DATA:  Possible sepsis EXAM: PORTABLE CHEST 1 VIEW COMPARISON:  07/03/2019 FINDINGS: Cardiac shadow is within normal limits. Aortic calcifications are noted. Lungs are well aerated bilaterally. Mild increased interstitial markings are noted on the right. Additionally some new basilar infiltrate is seen medially.  IMPRESSION: Increase in right basilar infiltrate with stable interstitial markings from the prior exam. Electronically Signed   By: Inez Catalina M.D.   On: 11/06/2019 18:25    Procedures .Critical Care Performed by: Nettie Elm, PA-C Authorized by: Nettie Elm, PA-C   Critical care provider statement:    Critical care time (minutes):  45   Critical care was necessary to treat or prevent imminent or life-threatening deterioration of the following conditions:  Sepsis and respiratory failure   Critical care was time spent personally by me on the following activities:  Discussions with consultants, evaluation of patient's response to treatment, examination of patient, ordering and performing treatments and interventions, ordering and review of laboratory studies, ordering and review of radiographic studies, pulse oximetry, re-evaluation of patient's condition, obtaining history from patient or surrogate and review of old charts   (including critical care time)  Medications Ordered in ED Medications  lactated ringers infusion ( Intravenous Rate/Dose Verify 11/06/19 2027)  ceFEPIme (MAXIPIME) 2 g in sodium chloride 0.9 % 100 mL IVPB (has no administration in time range)  vancomycin (VANCOCIN) IVPB 1000 mg/200 mL premix (has no administration in time range)  aspirin EC tablet 81 mg (has no administration in time range)  atorvastatin (LIPITOR) tablet 20 mg (20 mg Oral Not Given 11/06/19 2139)  divalproex (DEPAKOTE ER) 24 hr tablet 1,000 mg (has no administration in time range)  pantoprazole (PROTONIX) EC tablet 40 mg (has no administration in time range)  risperiDONE (RISPERDAL) tablet 2 mg (has no administration in time range)  dextrose 5% in lactated ringers with KCl 20 mEq/L infusion (has no administration in time range)  guaiFENesin (MUCINEX) 12 hr tablet 1,200 mg (has no administration in time range)  levalbuterol (XOPENEX) nebulizer solution 0.63 mg (has no administration in  time range)  gabapentin (NEURONTIN) capsule 100 mg (has no administration in time range)    And  gabapentin (NEURONTIN) capsule 200 mg (has no administration in time range)  amLODipine (NORVASC) tablet 2.5 mg (has no administration in time range)  acetaminophen (TYLENOL) tablet 650 mg (has no administration in time range)  lactated ringers bolus 1,000 mL (0 mLs Intravenous Stopped 11/06/19 1924)  ceFEPIme (MAXIPIME) 2 g in sodium chloride 0.9 % 100 mL IVPB (0 g Intravenous Stopped 11/06/19 1917)  metroNIDAZOLE (FLAGYL) IVPB 500 mg ( Intravenous Stopped 11/06/19 2020)  vancomycin (VANCOCIN) IVPB 1000 mg/200 mL premix (0 mg Intravenous Stopped 11/06/19 1932)    ED Course  I have reviewed the triage vital signs and the nursing notes.  Pertinent labs & imaging results that were available during my care of the patient were reviewed by me and considered in my medical decision making (see chart for details).  64 year old presents for evaluation of altered mental status  On arrival with febrile rectal temperature, tachycardic, tachypneic and hypoxic.  Code sepsis called.  Antibiotics given for unknown source.  Given 1 L fluid bolus will hold on 30/cc/kg given pending lactic acid and maps greater than 65 currently.  Heart and lungs clear.  Her abdomen is soft, nontender.  No evidence of infectious process on exam.  Patient will open eyes to name however is not able to state name, date of birth.  Will occasionally shake to her right upper arm however self resolves.  Equal handgrip bilaterally.  She freely moves her neck.  Plan on labs, imaging and reassess.  Patient critically ill will ultimately need admission.  Does have history of intracranial bleed after fall however no evidence of traumatic injury on exam.  Low suspicion for acute intracranial abnormality as cause of her exam today.  Patient reassessed.  Has had 1 L IV fluids as well as antibiotics.  On reassessment patient is more alert and  oriented.  She is able to answer questions.  Does states she has had a cough. No SOB, CP or leg swelling.  Labs and imaging personally reviewed and interpreted:  CBC without leukocytosis Metabolic panel with mild hyperglycemia however no additional electrolyte, renal or liver abnormality Covid negative UA negative for infection Ammonia 22 Valproic acid 81 EKG without ischemic changes Chest with right lower lobe infiltrate  Will admit for sepsis due to likely pneumonia with right lower lobe infiltrate.  I have low suspicion for PE, ACS as cause of her symptoms.  Mentation has improved with IV fluids.  She is still requiring oxygen at 2 L via nasal cannula.  Hold on additional IV fluids as patient lactic acid less than 4, maps greater than 65.  Low suspicion for septic shock at this time.  CONSULT with Dr. Nevada Crane with [facility] who will evaluate patient for admission.  Patient discussed with attending, Dr. Ashok Cordia who agrees above treatment, plan and disposition  The patient appears reasonably stabilized for admission considering the current resources, flow, and capabilities available in the ED at this time, and I doubt any other [facility] requiring further screening and/or treatment in the ED prior to admission.    MDM Rules/Calculators/A&P                          Cassidy Smith was evaluated in Emergency Department on 11/06/2019 for the symptoms described in the history of present illness. She was evaluated in the context of the global COVID-19 pandemic,  which necessitated consideration that the patient might be at risk for infection with the SARS-CoV-2 virus that causes COVID-19. Institutional protocols and algorithms that pertain to the evaluation of patients at risk for COVID-19 are in a state of rapid change based on information released by regulatory bodies including the CDC and federal and state organizations. These policies and algorithms were followed during the patient's care in the ED. Final  Clinical Impression(s) / ED Diagnoses Final diagnoses:  Sepsis with acute hypoxic respiratory failure without septic shock, due to unspecified organism ([place])  Altered mental status, unspecified altered mental status type  Pneumonia of right lower lobe due to infectious organism    Rx / DC Orders ED Discharge Orders    None       Zayquan Bogard A, PA-C 11/06/19 2201    Lajean Saver, MD 11/09/19 1606

## 2019-11-06 NOTE — Progress Notes (Signed)
Pharmacy Antibiotic Note  Cassidy Smith is a 64 y.o. female admitted on 11/06/2019 with unknown source.  Pharmacy has been consulted for Vancomycin and cefepime dosing.  Plan: Vancomycin 1000mg   IV every 24 hours.  Goal trough 15-20 mcg/mL.  Cefepime 2gm IV q12h F/U cxs and clinical progress Monitor V/S, labs, and levels as indicated  Height: 5\' 6"  (167.6 cm) Weight: 49 kg (108 lb) IBW/kg (Calculated) : 59.3  Temp (24hrs), Avg:99.9 F (37.7 C), Min:99.9 F (37.7 C), Max:99.9 F (37.7 C)  Recent Labs  Lab 11/06/19 1750  WBC 8.4  CREATININE 0.75  LATICACIDVEN 1.0    Estimated Creatinine Clearance: 53.5 mL/min (by C-G formula based on SCr of 0.75 mg/dL).    No Known Allergies  Antimicrobials this admission: Vancomycin 10/23 >>  Cefepime 10/23 >>   Dose adjustments this admission: prn  Microbiology results: 10/23 BCx: pending 10/23 UCx: pending  MRSA PCR:   Thank you for allowing pharmacy to be a part of this patient's care.  Isac Sarna, BS [place], [place] Clinical Pharmacist Pager [phone] 11/06/2019 6:45 PM

## 2019-11-06 NOTE — H&P (Addendum)
History and Physical  BRANDII LAKEY KZS:[id number] DOB: [birth year] DOA: 11/06/2019  Referring physician: Shelby Dubin PA, EDP  PCP: Neale Burly, MD  Outpatient Specialists: None Patient coming from: Home  Chief Complaint: Worsening altered mental status.  HPI: JULIEANNA GERACI is a 64 y.o. female with medical history significant for early onset dementia, dysphagia, intracerebral hematoma, hyperthyroidism, SIADH, recurrent UTI on prophylactic antibiotics, bipolar disorder, chronic anxiety/depression, GI bleed, chronic thrombocytopenia, rectal cancer, chronic DVT, who presented from home to [facility], ED due to worsening altered mental status, minimal interaction of 1 week duration.  Associated with intermittent cough, and chills.  Unable to obtain history from the patient due to confusion in the setting of dementia.  History is mainly obtained from [place], her son via phone, and review of medical records.  She receives 8-hour care at home and the rest of the time her brother stays with her.  Per her caregiver she has noted foul odor to her urine, has been somnolent throughout the day, was found shaking earlier, responds to her name and nothing else.  She was brought in for further evaluation.  Last healthcare institution visit was at least 2 months ago per her son via phone.  Work-up in the ED consistent with right lower lobe community-acquired pneumonia, sepsis code was called.  Was started on IV antibiotics empirically and IV fluid.  TRH was asked to admit.  ED Course:  Presented with T-max 101.3, heart rate 109, respiration rate 28.  Chest x-ray personally reviewed showed right lower lobe infiltrates consistent with right lower lobe pneumonia.  Lab studies essentially unremarkable.   Review of Systems: Review of systems as noted in the HPI. All other systems reviewed and are negative.   Past Medical History:  Diagnosis Date  . Anxiety   . Bipolar 1 disorder ([place])   . Chronic hip pain    . Chronic kidney disease    uti  currently,  hx bladder spasms  . Depression   . Headache(784.0)   . Hyperthyroidism   . Ischemic stroke ([place])   . Neuromuscular disorder ([facility])    shaking of hands   . SIADH (syndrome of inappropriate ADH production) ([place]) 04/08/2017   Past Surgical History:  Procedure Laterality Date  . COLONOSCOPY WITH PROPOFOL N/A 04/05/2017   Procedure: COLONOSCOPY WITH PROPOFOL;  Surgeon: Rogene Houston, MD;  Location: [facility];  Service: Endoscopy;  Laterality: N/A;  . INTRAMEDULLARY (IM) NAIL INTERTROCHANTERIC Right 03/07/2017   Procedure: OPEN TREATMENT INTERNAL FIXATION RIGHT HIP WITH GAMA INTRAMEDULARY NAIL;  Surgeon:  Civil, MD;  Location: [facility];  Service: Orthopedics;  Laterality: Right;  . MULTIPLE EXTRACTIONS WITH ALVEOLOPLASTY N/A 10/30/2012   Procedure: MULTIPLE EXTRACION 5, 6, 8, 9, 10 ,18, 19, 31 WITH MAXILLARY RIGHT AND LEFT  ALVEOLOPLASTY REDUCE MAXILLARY LEFT TUBEROSITY;  Surgeon: Gae Bon, DDS;  Location: [place];  Service: Oral Surgery;  Laterality: N/A;  . POLYPECTOMY  04/05/2017   Procedure: POLYPECTOMY;  Surgeon: Rogene Houston, MD;  Location: [facility];  Service: Endoscopy;;  recto-sigmoid, rectum    Social History:  reports that she quit smoking about 2 years ago. Her smoking use included cigarettes. She has a 10.00 pack-year smoking history. She has never used smokeless tobacco. She reports that she does not drink alcohol and does not use drugs.   No Known Allergies  Family History  Problem Relation Age of Onset  . Breast cancer Mother   . Cancer - Other  Mother   . Alcoholism Father   . Colon cancer Neg Hx   . Colon polyps Neg Hx       Prior to Admission medications   Medication Sig Start Date End Date Taking? Authorizing Provider  aspirin EC 81 MG tablet Take 81 mg by mouth daily. Swallow whole.   Yes [provider]  atorvastatin (LIPITOR) 20 MG tablet Take 1 tablet (20 mg total) by mouth  daily at 6 PM. 06/11/17  Yes Barton Dubois, MD  Cholecalciferol (VITAMIN D-3) 125 MCG (5000 UT) TABS Take 5,000 Units by mouth daily.   Yes [provider]  divalproex (DEPAKOTE ER) 500 MG 24 hr tablet Take 2 tablets (1,000 mg total) by mouth at bedtime. Patient taking differently: Take 1,500 mg by mouth at bedtime.  08/16/17  Yes Tat, Shanon Brow, MD  furosemide (LASIX) 20 MG tablet Take 0.5 tablets (10 mg total) by mouth every other day. Patient taking differently: Take 10 mg by mouth daily.  01/08/18  Yes Johnson, Clanford L, MD  gabapentin (NEURONTIN) 100 MG capsule Take 1 capsule (100 mg total) by mouth 3 (three) times daily. Patient taking differently: Take 100-200 mg by mouth See admin instructions. Take one capsule (100 mg) by mouth every morning and two capsules (200 mg) at night 05/13/17  Yes  Civil, MD  Multiple Vitamin (MULTIVITAMIN WITH MINERALS) TABS tablet Take 1 tablet by mouth daily. Centrum Silver   Yes [provider]  pantoprazole (PROTONIX) 40 MG tablet Take 40 mg by mouth daily.   Yes [provider]  potassium chloride (K-DUR,KLOR-CON) 10 MEQ tablet Take 1 tablet (10 mEq total) by mouth every other day. Patient taking differently: Take 10 mEq by mouth daily.  01/08/18  Yes Johnson, Clanford L, MD  risperiDONE (RISPERDAL) 2 MG tablet Take 2 mg by mouth at bedtime. 06/21/19  Yes [provider]  traZODone (DESYREL) 100 MG tablet Take 100 mg by mouth at bedtime.    Yes [provider]  benztropine (COGENTIN) 1 MG tablet Take 1 tablet (1 mg total) by mouth 2 (two) times daily. Patient not taking: Reported on 11/06/2019 01/05/18   Murlean Iba, MD  cefUROXime (CEFTIN) 500 MG tablet Take 500 mg by mouth 2 (two) times daily. Patient not taking: Reported on 11/06/2019 08/18/19   [provider]  potassium chloride (KLOR-CON) 10 MEQ tablet Take 10 mEq by mouth daily. Patient not taking: Reported on 11/06/2019 10/19/19    [provider]  risperiDONE (RISPERDAL) 1 MG tablet Take 1 tablet (1 mg total) by mouth at bedtime as needed for up to 15 days (agitation). Patient not taking: Reported on 07/03/2019 01/05/18 01/20/18  Murlean Iba, MD  vitamin B-12 (CYANOCOBALAMIN) 1000 MCG tablet Take 1 tablet (1,000 mcg total) by mouth daily. Patient not taking: Reported on 11/06/2019 06/11/17   Barton Dubois, MD    Physical Exam: BP (!) 162/90 (BP Location: Left Arm)   Pulse (!) 104   Temp (!) 101.3 F (38.5 C) (Rectal)   Resp (!) 24   Ht 5\' 6"  (1.676 m)   Wt 50 kg   SpO2 92%   BMI 17.79 kg/m   . General: 64 y.o. year-old female well developed well nourished in no acute distress.  Shivering, somnolent but easily arousable to voices. . Cardiovascular: Tachycardic with no rubs or gallops.  No thyromegaly or JVD noted.  Trace lower extremity edema. 2/4 pulses in all 4 extremities. Marland Kitchen Respiratory: Mild rales at bases.  No wheezing noted. Poor inspiratory effort. . Abdomen: Soft nontender nondistended with normal bowel sounds x4 quadrants. . Muskuloskeletal: No cyanosis or clubbing.  Trace edema noted bilaterally in lower extremities. . Neuro: CN II-XII intact, strength, sensation, reflexes . Skin: No ulcerative lesions noted or rashes . Psychiatry: Judgement and insight appear altered. Mood is appropriate for condition and setting          Labs on Admission:  Basic Metabolic Panel: Recent Labs  Lab 11/06/19 1750  NA 139  K 3.6  CL 99  CO2 29  GLUCOSE 108*  BUN 19  CREATININE 0.75  CALCIUM 9.8   Liver Function Tests: Recent Labs  Lab 11/06/19 1750  AST 19  ALT 11  ALKPHOS 82  BILITOT 0.7  PROT 7.2  ALBUMIN 3.6   No results for input(s): LIPASE, AMYLASE in the last 168 hours. Recent Labs  Lab 11/06/19 1824  AMMONIA 22   CBC: Recent Labs  Lab 11/06/19 1750  WBC 8.4  NEUTROABS 5.6  HGB 12.6  HCT 38.0  MCV 92.7  PLT 89*   Cardiac Enzymes: No results for input(s):  CKTOTAL, CKMB, CKMBINDEX, TROPONINI in the last 168 hours.  BNP (last 3 results) No results for input(s): BNP in the last 8760 hours.  ProBNP (last 3 results) No results for input(s): PROBNP in the last 8760 hours.  CBG: No results for input(s): GLUCAP in the last 168 hours.  Radiological Exams on Admission: DG Chest Port 1 View  Result Date: 11/06/2019 CLINICAL DATA:  Possible sepsis EXAM: PORTABLE CHEST 1 VIEW COMPARISON:  07/03/2019 FINDINGS: Cardiac shadow is within normal limits. Aortic calcifications are noted. Lungs are well aerated bilaterally. Mild increased interstitial markings are noted on the right. Additionally some new basilar infiltrate is seen medially. IMPRESSION: Increase in right basilar infiltrate with stable interstitial markings from the prior exam. Electronically Signed   By: Inez Catalina M.D.   On: 11/06/2019 18:25    EKG: I independently viewed the EKG done and my findings are as followed: Sinus tachycardia, rate 101, no specific ST-T changes.  Assessment/Plan Present on Admission: . CAP (community acquired pneumonia)  Active Problems:   CAP (community acquired pneumonia)  Sepsis secondary to right lower lobe community-acquired pneumonia, POA Presented with T-max 101.3, heart rate 109, respiration rate 28.  Worsening altered mental status x1 week, intermittent cough, and Chest x-ray personally reviewed showing right lower lobe infiltrates consistent with pneumonia. Code sepsis was called in the ED and she was started on IV antibiotics empirically, and IV fluid Obtain procalcitonin level Obtain sputum culture Follow blood cultures Continue IV antibiotics started in the ED IV vancomycin and cefepime, de-escalate as needed in the morning for community-acquired pneumonia specific empiric IV antibiotics. Get MRSA screen Monitor fever curve and WBC  Acute metabolic encephalopathy, suspect secondary to acute illness UA negative for pyuria Ammonia level  normal Obtain TSH Obtain CT head Treat underlying condition Reorient as needed Fall precautions  Acute hypoxic respiratory failure secondary to community-acquired pneumonia Presented with O2 saturation in the 80s Not on oxygen supplementation at baseline Currently on 3 L to maintain O2 saturation greater than 92% Start bronchodilators and pulmonary toilet Incentive spirometer and flutter valve  Possible dysphagia Per her son via phone she has had some difficulty swallowing solid foods in the past Speech therapist consult for swallow evaluation N.p.o. until passes swallow evaluation We will add D5W LR KCl to avoid hypoglycemia and dehydration. Aspiration precautions  Essential hypertension BP is not at  goal Hold home lasix Start low dose Norvasc 2.5 mg daily Monitor vital signs  Bipolar disorder/chronic depression/anxiety Resume home medications  Hyperlipidemia/GERD Resume home Lipitor Resume PPI  Polyneuropathy Resume home medications  Recurrent UTI Resume home prophylactic antibiotics at discharge. Follow urine culture  Generalized weakness/physical debility/Hx of hyperthyroidism Obtain TSH, not on home thyroid medication PT OT to assess Fall precautions Encourage oral intake as tolerated when no longer NPO Feeding assistance when no longer NPO.     DVT prophylaxis: Subcu Lovenox daily  Code Status: DNR as stated by her son, she also has a DNR form in the room.  Family Communication: Updated her son Thedore Mins via phone.  Disposition Plan: Admit to telemetry  Consults called: None  Admission status: Inpatient status   Status is: Inpatient    Dispo:  Patient From: Home  Planned Disposition: Home with Health Care Svc  Expected discharge date: 11/08/19  Medically stable for discharge: No, ongoing management of sepsis secondary to community-acquired pneumonia.        Kayleen Memos MD [facility] Pager [phone]  If 7PM-7AM, please  contact night-coverage [email or web address] Password [facility]  11/06/2019, 8:19 PM

## 2019-11-06 NOTE — ED Notes (Signed)
Pt to CT

## 2019-11-06 NOTE — ED Notes (Signed)
Lab in room to draw second blood culture before antibiotics administered.

## 2019-11-06 NOTE — ED Triage Notes (Signed)
Pt is from home. Has 24 hour care. In the last week, pt began talking out of her head. Prone to UTI's. Foul urine odor. Today, patient was found shaking. Pt responds to her name, but nothing else. Warm to the touch. NSR.

## 2019-11-07 ENCOUNTER — Encounter (HOSPITAL_COMMUNITY): Payer: Self-pay | Admitting: Internal Medicine

## 2019-11-07 LAB — GLUCOSE, CAPILLARY
Glucose-Capillary: 122 mg/dL — ABNORMAL HIGH (ref 70–99)
Glucose-Capillary: 97 mg/dL (ref 70–99)
Glucose-Capillary: 90 mg/dL (ref 70–99)
Glucose-Capillary: 92 mg/dL (ref 70–99)
Glucose-Capillary: 110 mg/dL — ABNORMAL HIGH (ref 70–99)

## 2019-11-07 LAB — CBC WITH DIFFERENTIAL/PLATELET
Abs Immature Granulocytes: 0.03 10*3/uL (ref 0.00–0.07)
Basophils Absolute: 0.1 10*3/uL (ref 0.0–0.1)
Basophils Relative: 0 %
Eosinophils Absolute: 0.2 10*3/uL (ref 0.0–0.5)
Eosinophils Relative: 2 %
HCT: 37.4 % (ref 36.0–46.0)
Hemoglobin: 12.2 g/dL (ref 12.0–15.0)
Immature Granulocytes: 0 %
Lymphocytes Relative: 21 %
Lymphs Abs: 2.5 10*3/uL (ref 0.7–4.0)
MCH: 30.3 pg (ref 26.0–34.0)
MCHC: 32.6 g/dL (ref 30.0–36.0)
MCV: 93 fL (ref 80.0–100.0)
Monocytes Absolute: 1.2 10*3/uL — ABNORMAL HIGH (ref 0.1–1.0)
Monocytes Relative: 10 %
Neutro Abs: 7.9 10*3/uL — ABNORMAL HIGH (ref 1.7–7.7)
Neutrophils Relative %: 67 %
Platelets: 77 10*3/uL — ABNORMAL LOW (ref 150–400)
RBC: 4.02 MIL/uL (ref 3.87–5.11)
RDW: 14.7 % (ref 11.5–15.5)
WBC: 11.8 10*3/uL — ABNORMAL HIGH (ref 4.0–10.5)
nRBC: 0 % (ref 0.0–0.2)

## 2019-11-07 LAB — MAGNESIUM: Magnesium: 1.3 mg/dL — ABNORMAL LOW (ref 1.7–2.4)

## 2019-11-07 LAB — COMPREHENSIVE METABOLIC PANEL
ALT: 11 U/L (ref 0–44)
AST: 21 U/L (ref 15–41)
Albumin: 3.1 g/dL — ABNORMAL LOW (ref 3.5–5.0)
Alkaline Phosphatase: 71 U/L (ref 38–126)
Anion gap: 7 (ref 5–15)
BUN: 12 mg/dL (ref 8–23)
CO2: 29 mmol/L (ref 22–32)
Calcium: 9.4 mg/dL (ref 8.9–10.3)
Chloride: 98 mmol/L (ref 98–111)
Creatinine, Ser: 0.53 mg/dL (ref 0.44–1.00)
GFR, Estimated: >60 mL/min (ref >60)
Glucose, Bld: 106 mg/dL — ABNORMAL HIGH (ref 70–99)
Potassium: 3.6 mmol/L (ref 3.5–5.1)
Sodium: 134 mmol/L — ABNORMAL LOW (ref 135–145)
Total Bilirubin: 0.7 mg/dL (ref 0.3–1.2)
Total Protein: 6.5 g/dL (ref 6.5–8.1)

## 2019-11-07 LAB — PHOSPHORUS: Phosphorus: 2.6 mg/dL (ref 2.5–4.6)

## 2019-11-07 LAB — PROCALCITONIN: Procalcitonin: <0.1 ng/mL

## 2019-11-07 LAB — STREP PNEUMONIAE URINARY ANTIGEN: Strep Pneumo Urinary Antigen: NEGATIVE

## 2019-11-07 MED ORDER — IPRATROPIUM BROMIDE 0.02 % IN SOLN
0.5000 mg | Freq: Three times a day (TID) | RESPIRATORY_TRACT | Status: DC
  Administered 2019-11-08 (×2): 0.5 mg via RESPIRATORY_TRACT
  Filled 2019-11-07 (×2): qty 2.5

## 2019-11-07 MED ORDER — MAGNESIUM SULFATE 2 GM/50ML IV SOLN
2.0000 g | Freq: Once | INTRAVENOUS | Status: AC
  Administered 2019-11-07: 2 g via INTRAVENOUS
  Filled 2019-11-07: qty 50

## 2019-11-07 MED ORDER — ENOXAPARIN SODIUM 40 MG/0.4ML ~~LOC~~ SOLN
40.0000 mg | SUBCUTANEOUS | Status: DC
  Administered 2019-11-07 – 2019-11-08 (×2): 40 mg via SUBCUTANEOUS
  Filled 2019-11-07 (×2): qty 0.4

## 2019-11-07 MED ORDER — IPRATROPIUM BROMIDE 0.02 % IN SOLN: 0.5000 mg | Freq: Three times a day (TID) | RESPIRATORY_TRACT | Status: DC

## 2019-11-07 MED ORDER — SODIUM CHLORIDE 0.9 % IV SOLN: INTRAVENOUS | Status: DC

## 2019-11-07 MED ORDER — IPRATROPIUM BROMIDE 0.02 % IN SOLN
0.5000 mg | Freq: Three times a day (TID) | RESPIRATORY_TRACT | Status: DC
  Administered 2019-11-07 (×3): 0.5 mg via RESPIRATORY_TRACT
  Filled 2019-11-07 (×3): qty 2.5

## 2019-11-07 MED ORDER — ONDANSETRON HCL 4 MG/2ML IJ SOLN: 4.0000 mg | Freq: Four times a day (QID) | INTRAMUSCULAR | Status: DC | PRN

## 2019-11-07 NOTE — Plan of Care (Signed)
  Problem: Education: Goal: Knowledge of General Education information will improve Description Including pain rating scale, medication(s)/side effects and non-pharmacologic comfort measures Outcome: Progressing   Problem: Health Behavior/Discharge Planning: Goal: Ability to manage health-related needs will improve Outcome: Progressing   

## 2019-11-07 NOTE — Progress Notes (Signed)
PROGRESS NOTE    Cassidy Smith  KGM:[id number] DOB: [birth year] DOA: 11/06/2019 PCP: Neale Burly, MD   Brief Narrative:  Per HPI: Cassidy Smith is a 64 y.o. female with medical history significant for early onset dementia, dysphagia, intracerebral hematoma, hyperthyroidism, SIADH, recurrent UTI on prophylactic antibiotics, bipolar disorder, chronic anxiety/depression, GI bleed, chronic thrombocytopenia, rectal cancer, chronic DVT, who presented from home to [facility], ED due to worsening altered mental status, minimal interaction of 1 week duration.  Associated with intermittent cough, and chills.  Unable to obtain history from the patient due to confusion in the setting of dementia.  History is mainly obtained from [place], her son via phone, and review of medical records.  She receives 8-hour care at home and the rest of the time her brother stays with her.  Per her caregiver she has noted foul odor to her urine, has been somnolent throughout the day, was found shaking earlier, responds to her name and nothing else.  She was brought in for further evaluation.  Last healthcare institution visit was at least 2 months ago per her son via phone.  Work-up in the ED consistent with right lower lobe community-acquired pneumonia, sepsis code was called.  Was started on IV antibiotics empirically and IV fluid.  TRH was asked to admit.  ED Course:  Presented with T-max 101.3, heart rate 109, respiration rate 28.  Chest x-ray personally reviewed showed right lower lobe infiltrates consistent with right lower lobe pneumonia.  Lab studies essentially unremarkable.  10/24: Patient was admitted with acute metabolic encephalopathy in the setting of sepsis with concern for right lower lobe pneumonia.  She has been started on vancomycin and cefepime empirically and appears somnolent this morning.  UA negative for UTI and there is concern for dysphagia for which she remains n.p.o. at this time.  Assessment &  Plan:   Active Problems:   CAP (community acquired pneumonia)   Acute metabolic encephalopathy secondary to right lower lobe pneumonia with sepsis -Continue current antibiotics of vancomycin and cefepime -Procalcitonin noted to be low, may taper sooner rather than later with MRSA PCR pending -Blood cultures thus far negative and sputum culture pending -Hold home sedating agents for now -TSH 1.2 and ammonia within normal limits -CT head without any new or acute findings  Acute hypoxemic respiratory failure secondary to above -Currently on 3 L nasal cannula oxygen, wean as tolerated -Bronchodilators and incentive spirometer with flutter valve  Hypomagnesemia -Replete and reevaluate in a.m.  Possible dysphagia -Keep n.p.o. with SLP evaluation pending -Keep on IV fluid for hydration  Essential hypertension-controlled -Continue amlodipine 2.5 mg daily and hold home Lasix  Bipolar disorder/chronic depression/anxiety -Home medications currently held until more awake and alert  Dyslipidemia/GERD -Continue home Lipitor -Continue PPI  Polyneuropathy -Hold gabapentin for now until more awake and alert  Recurrent UTI -UA without any significant signs of UTI at the moment, continue antibiotics as otherwise prescribed for pneumonia as above  Generalized weakness/physical debility -PT/OT to assess with fall precautions -Recent discharge from [facility] -Palliative care involvement to discuss goals of care  History of hyperthyroidism -TSH 1.2  DVT prophylaxis: Lovenox Code Status: DNR Family Communication: Discussed with son on phone 10/24 Disposition Plan:  Status is: Inpatient  Remains inpatient appropriate because:IV treatments appropriate due to intensity of illness or inability to take PO and Inpatient level of care appropriate due to severity of illness   Dispo:  Patient From: Home  Planned Disposition: Home with Health Care  Svc  Expected discharge date:  11/08/19  Medically stable for discharge: No   Consultants:   Palliative Care  Procedures:   See below  Antimicrobials:  Anti-infectives (From admission, onward)   Start     Dose/Rate Route Frequency Ordered Stop   11/07/19 1800  vancomycin (VANCOCIN) IVPB 1000 mg/200 mL premix        1,000 mg 200 mL/hr over 60 Minutes Intravenous Every 24 hours 11/06/19 1850     11/07/19 0200  ceFEPIme (MAXIPIME) 2 g in sodium chloride 0.9 % 100 mL IVPB        2 g 200 mL/hr over 30 Minutes Intravenous Every 12 hours 11/06/19 1850     11/06/19 1800  ceFEPIme (MAXIPIME) 2 g in sodium chloride 0.9 % 100 mL IVPB        2 g 200 mL/hr over 30 Minutes Intravenous  Once 11/06/19 1752 11/06/19 1917   11/06/19 1800  metroNIDAZOLE (FLAGYL) IVPB 500 mg        500 mg 100 mL/hr over 60 Minutes Intravenous  Once 11/06/19 1752 11/06/19 2020   11/06/19 1800  vancomycin (VANCOCIN) IVPB 1000 mg/200 mL premix        1,000 mg 200 mL/hr over 60 Minutes Intravenous  Once 11/06/19 1752 11/06/19 1932       Subjective: Patient seen and evaluated today and appears to be quite somnolent and poorly responsive.  No acute overnight events noted.  Objective: Vitals:   11/06/19 2311 11/07/19 0035 11/07/19 0510 11/07/19 0758  BP: (!) 102/53 99/80 (!) 155/67   Pulse:  79 74   Resp: 20 20 17    Temp:  99.5 F (37.5 C) (!) 96.9 F (36.1 C)   TempSrc:  Oral    SpO2: 98% 98% 100% 97%  Weight:  54.6 kg    Height:  5\' 6"  (1.676 m)      Intake/Output Summary (Last 24 hours) at 11/07/2019 0954 Last data filed at 11/07/2019 0300 Gross per 24 hour  Intake 2527.97 ml  Output --  Net 2527.97 ml   Filed Weights   11/06/19 1815 11/06/19 1954 11/07/19 0035  Weight: 49 kg 50 kg 54.6 kg    Examination:  General exam: Appears calm and comfortable  Respiratory system: Clear to auscultation. Respiratory effort normal.  Currently on 3 L nasal cannula oxygen Cardiovascular system: S1 & S2 heard, RRR.  Gastrointestinal  system: Abdomen is nondistended, soft and nontender.  Central nervous system: Somnolent Extremities: No edema Skin: No rashes, lesions or ulcers Psychiatry: Difficult to assess    Data Reviewed: I have personally reviewed following labs and imaging studies  CBC: Recent Labs  Lab 11/06/19 1750 11/07/19 0704  WBC 8.4 11.8*  NEUTROABS 5.6 7.9*  HGB 12.6 12.2  HCT 38.0 37.4  MCV 92.7 93.0  PLT 89* 77*   Basic Metabolic Panel: Recent Labs  Lab 11/06/19 1750 11/07/19 0704  NA 139 134*  K 3.6 3.6  CL 99 98  CO2 29 29  GLUCOSE 108* 106*  BUN 19 12  CREATININE 0.75 0.53  CALCIUM 9.8 9.4  MG  --  1.3*  PHOS  --  2.6   GFR: Estimated Creatinine Clearance: 59.6 mL/min (by C-G formula based on SCr of 0.53 mg/dL). Liver Function Tests: Recent Labs  Lab 11/06/19 1750 11/07/19 0704  AST 19 21  ALT 11 11  ALKPHOS 82 71  BILITOT 0.7 0.7  PROT 7.2 6.5  ALBUMIN 3.6 3.1*   No results for input(s): LIPASE,  AMYLASE in the last 168 hours. Recent Labs  Lab 11/06/19 1824  AMMONIA 22   Coagulation Profile: Recent Labs  Lab 11/06/19 1750  INR 1.0   Cardiac Enzymes: No results for input(s): CKTOTAL, CKMB, CKMBINDEX, TROPONINI in the last 168 hours. BNP (last 3 results) No results for input(s): PROBNP in the last 8760 hours. HbA1C: No results for input(s): HGBA1C in the last 72 hours. CBG: Recent Labs  Lab 11/07/19 0644 11/07/19 0742  GLUCAP 122* 97   Lipid Profile: No results for input(s): CHOL, HDL, LDLCALC, TRIG, CHOLHDL, LDLDIRECT in the last 72 hours. Thyroid Function Tests: Recent Labs    11/06/19 1824  TSH 1.216   Anemia Panel: No results for input(s): VITAMINB12, FOLATE, FERRITIN, TIBC, IRON, RETICCTPCT in the last 72 hours. Sepsis Labs: Recent Labs  Lab 11/06/19 1750 11/07/19 0704  PROCALCITON  --  <0.10  LATICACIDVEN 1.0  --     Recent Results (from the past 240 hour(s))  Respiratory Panel by RT PCR (Flu A&B, Covid) - Nasopharyngeal Swab      Status: None   Collection Time: 11/06/19  6:30 PM   Specimen: Nasopharyngeal Swab  Result Value Ref Range Status   SARS Coronavirus 2 by RT PCR NEGATIVE NEGATIVE Final    Comment: (NOTE) SARS-CoV-2 target nucleic acids are NOT DETECTED.  The SARS-CoV-2 RNA is generally detectable in upper respiratoy specimens during the acute phase of infection. The lowest concentration of SARS-CoV-2 viral copies this assay can detect is 131 copies/mL. A negative result does not preclude SARS-Cov-2 infection and should not be used as the sole basis for treatment or other patient management decisions. A negative result may occur with  improper specimen collection/handling, submission of specimen other than nasopharyngeal swab, presence of viral mutation(s) within the areas targeted by this assay, and inadequate number of viral copies (<131 copies/mL). A negative result must be combined with clinical observations, patient history, and epidemiological information. The expected result is Negative.  Fact Sheet for Patients:  PinkCheek.be  Fact Sheet for Healthcare Providers:  GravelBags.it  This test is no t yet approved or cleared by the [place] FDA and  has been authorized for detection and/or diagnosis of SARS-CoV-2 by FDA under an Emergency Use Authorization (EUA). This EUA will remain  in effect (meaning this test can be used) for the duration of the COVID-19 declaration under Section 564(b)(1) of the Act, 21 U.S.C. section 360bbb-3(b)(1), unless the authorization is terminated or revoked sooner.     Influenza A by PCR NEGATIVE NEGATIVE Final   Influenza B by PCR NEGATIVE NEGATIVE Final    Comment: (NOTE) The Xpert Xpress SARS-CoV-2/FLU/RSV assay is intended as an aid in  the diagnosis of influenza from Nasopharyngeal swab specimens and  should not be used as a sole basis for treatment. Nasal washings and  aspirates are  unacceptable for Xpert Xpress SARS-CoV-2/FLU/RSV  testing.  Fact Sheet for Patients: PinkCheek.be  Fact Sheet for Healthcare Providers: GravelBags.it  This test is not yet approved or cleared by the [place] FDA and  has been authorized for detection and/or diagnosis of SARS-CoV-2 by  FDA under an Emergency Use Authorization (EUA). This EUA will remain  in effect (meaning this test can be used) for the duration of the  Covid-19 declaration under Section 564(b)(1) of the Act, 21  U.S.C. section 360bbb-3(b)(1), unless the authorization is  terminated or revoked. Performed at [facility], [address], [place], [place] [address]  Radiology Studies: CT HEAD WO CONTRAST  Result Date: 11/06/2019 CLINICAL DATA:  Altered mental status EXAM: CT HEAD WITHOUT CONTRAST TECHNIQUE: Contiguous axial images were obtained from the base of the skull through the vertex without intravenous contrast. COMPARISON:  07/04/2019 FINDINGS: Brain: Previously seen parenchymal hemorrhage in the left temporal lobe has nearly completely resolved. Mild chronic white matter ischemic changes are noted. No findings to suggest acute hemorrhage, acute infarction or space-occupying mass lesion are noted. Mild atrophic changes are noted as well. Vascular: No hyperdense vessel or unexpected calcification. Skull: Normal. Negative for fracture or focal lesion. Sinuses/Orbits: No acute finding. Other: None. IMPRESSION: Previously seen parenchymal hemorrhage in the right temporal lobe and subarachnoid hemorrhage has nearly completely resolved in the interval. No new hemorrhage is noted. 7 Chronic atrophic and ischemic changes. Electronically Signed   By: Inez Cassidy M.D.   On: 11/06/2019 21:53   DG Chest Port 1 View  Result Date: 11/06/2019 CLINICAL DATA:  Possible sepsis EXAM: PORTABLE CHEST 1 VIEW COMPARISON:  07/03/2019 FINDINGS: Cardiac shadow is  within normal limits. Aortic calcifications are noted. Lungs are well aerated bilaterally. Mild increased interstitial markings are noted on the right. Additionally some new basilar infiltrate is seen medially. IMPRESSION: Increase in right basilar infiltrate with stable interstitial markings from the prior exam. Electronically Signed   By: Inez Cassidy M.D.   On: 11/06/2019 18:25        Scheduled Meds: . amLODipine  2.5 mg Oral Daily  . aspirin EC  81 mg Oral Daily  . atorvastatin  20 mg Oral q1800  . enoxaparin (LOVENOX) injection  40 mg Subcutaneous Q24H  . guaiFENesin  1,200 mg Oral BID  . ipratropium  0.5 mg Nebulization TID  . levalbuterol  0.63 mg Nebulization TID  . pantoprazole  40 mg Oral Daily   Continuous Infusions: . sodium chloride    . ceFEPime (MAXIPIME) IV 2 g (11/07/19 0228)  . magnesium sulfate bolus IVPB    . vancomycin       LOS: 1 day    Time spent: 35 minutes    Koreen Lizaola Darleen Crocker, DO [facility]  If 7PM-7AM, please contact night-coverage [email or web address] 11/07/2019, 9:54 AM

## 2019-11-07 NOTE — TOC Initial Note (Signed)
Transition of Care [facility]) - Initial/Assessment Note    Patient Details  Name: Cassidy Smith MRN: [id number] Date of Birth: Oct 24, 1955  Transition of [facility]) CM/SW Contact:    Natasha Bence, LCSW Phone Number: 11/07/2019, 4:03 PM  Clinical Narrative:                 Patient is a 64 year old female admitted for CAP (community acquired pneumonia). CSW received SNF referral. Per PT, patient is eligible for [facility] also given that patient has 24 hour care in the home. CSW contacted patient's son. Patient's son reported that family would prefer HH, but did not have a preferred provider. CSW placed referral for HHPT. Joelene Millin with [facility] agreeable to provide services to patient. TOC to follow.         Patient Goals and CMS Choice        Expected Discharge Plan and Services                                                Prior Living Arrangements/Services                       Activities of Daily Living Home Assistive Devices/Equipment: None ADL Screening (condition at time of admission) Patient's cognitive ability adequate to safely complete daily activities?: No Is the patient deaf or have difficulty hearing?: No Does the patient have difficulty seeing, even when wearing glasses/contacts?: No Does the patient have difficulty concentrating, remembering, or making decisions?: Yes Patient able to express need for assistance with ADLs?: No Does the patient have difficulty dressing or bathing?: Yes Independently performs ADLs?: No Communication: Independent Dressing (OT): Dependent Is this a change from baseline?: Pre-admission baseline Grooming: Dependent Is this a change from baseline?: Pre-admission baseline Feeding: Needs assistance Is this a change from baseline?: Pre-admission baseline Bathing: Dependent Is this a change from baseline?: Pre-admission baseline Toileting: Dependent Is this a change from baseline?: Pre-admission baseline In/Out Bed:  Dependent Is this a change from baseline?: Pre-admission baseline Walks in Home: Dependent Is this a change from baseline?: Pre-admission baseline Does the patient have difficulty walking or climbing stairs?: Yes Weakness of Legs: Both Weakness of Arms/Hands: Both  Permission Sought/Granted                  Emotional Assessment              Admission diagnosis:  CAP (community acquired pneumonia) [J18.9] Altered mental status, unspecified altered mental status type [R41.82] Pneumonia of right lower lobe due to infectious organism [J18.9] Sepsis with acute hypoxic respiratory failure without septic shock, due to unspecified organism ([place]) [A41.9, R65.20, J96.01] Patient Active Problem List   Diagnosis Date Noted  . CAP (community acquired pneumonia) 11/06/2019  . Intracerebral hematoma ([place]) 07/04/2019  . Closed head injury 07/03/2019  . Thrombocytopenia ([place]) 07/03/2019  . UTI (urinary tract infection) 01/04/2018  . Neuromuscular disorder ([place])   . Dysuria 10/30/2017  . Fever 08/15/2017  . Hypokalemia 08/15/2017  . Altered mental state 08/15/2017  . Acute metabolic encephalopathy 18/29/9371  . Valproic acid toxicity 08/15/2017  . Altered mental status   . Ischemic stroke ([place])   . GERD (gastroesophageal reflux disease) 05/12/2017  . GI bleed   . SIADH (syndrome of inappropriate ADH production) ([place]) 04/08/2017  . Dysplastic rectal polyp   .  Protein-calorie malnutrition, severe 04/07/2017  . Pressure injury of skin 04/04/2017  . Anticoagulated 04/03/2017  . Stool bloody 04/03/2017  . Current every day smoker 04/03/2017  . Rectal bleeding   . Chronic diarrhea   . Hyperthyroidism   . S/P ORIF (open reduction internal fixation) fracture right hip IM nail 03/07/17 03/24/2017  . Hypomagnesemia 03/07/2017  . Fall   . Closed intertrochanteric fracture of hip, right, initial encounter ([place]) 03/06/2017  . Radial styloid fracture: right 03/06/2017  . Anemia 03/06/2017   . Orthostatic hypotension 06/07/2015  . Lower urinary tract infectious disease 06/07/2015  . Rhabdomyolysis 06/07/2015  . White matter abnormality on MRI of brain 05/14/2013  . History of depression 05/14/2013  . Hx of anxiety disorder 05/14/2013  . Bipolar I disorder, most recent episode (or current) manic, unspecified 05/14/2013  . Bipolar 1 disorder ([place]) 05/14/2013  . Hyponatremia 05/12/2013  . Bipolar disorder ([place]) 05/12/2013  . Lacunar infarct, acute ([place]) 05/12/2013   PCP:  Neale Burly, MD Pharmacy:   [place], [place] [address] Phone: [phone] Fax: [place], [place] [address] [place] [address] Phone: [phone] Fax: [phone]     Social Determinants of Health (SDOH) Interventions    Readmission Risk Interventions No flowsheet data found.

## 2019-11-07 NOTE — Plan of Care (Signed)
  Problem: Acute Rehab PT Goals(only PT should resolve) Goal: Pt Will Go Supine/Side To Sit Outcome: Progressing Flowsheets (Taken 11/07/2019 1312) Pt will go Supine/Side to Sit: with modified independence Goal: Patient Will Transfer Sit To/From Stand Outcome: Progressing Flowsheets (Taken 11/07/2019 1312) Patient will transfer sit to/from stand:  with supervision  with min guard assist Goal: Pt Will Transfer Bed To Chair/Chair To Bed Outcome: Progressing Flowsheets (Taken 11/07/2019 1312) Pt will Transfer Bed to Chair/Chair to Bed: min guard assist Goal: Pt Will Ambulate Outcome: Progressing Flowsheets (Taken 11/07/2019 1312) Pt will Ambulate:  75 feet  with min guard assist  with minimal assist  with rolling walker   1:13 PM, 11/07/19 Lonell Grandchild, MPT Physical Therapist with [facility] [phone] office [phone] mobile phone

## 2019-11-07 NOTE — Evaluation (Signed)
Physical Therapy Evaluation Patient Details Name: Cassidy Smith MRN: [id number] DOB: [birth year] Today's Date: 11/07/2019   History of Present Illness  Cassidy Smith is a 64 y.o. female with medical history significant for early onset dementia, dysphagia, intracerebral hematoma, hyperthyroidism, SIADH, recurrent UTI on prophylactic antibiotics, bipolar disorder, chronic anxiety/depression, GI bleed, chronic thrombocytopenia, rectal cancer, chronic DVT, who presented from home to [facility], ED due to worsening altered mental status, minimal interaction of 1 week duration.  Associated with intermittent cough, and chills.  Unable to obtain history from the patient due to confusion in the setting of dementia.  History is mainly obtained from [place], her son via phone, and review of medical records.  She receives 8-hour care at home and the rest of the time her brother stays with her.  Per her caregiver she has noted foul odor to her urine, has been somnolent throughout the day, was found shaking earlier, responds to her name and nothing else.  She was brought in for further evaluation.  Last healthcare institution visit was at least 2 months ago per her son via phone.  Work-up in the ED consistent with right lower lobe community-acquired pneumonia, sepsis code was called.  Was started on IV antibiotics empirically and IV fluid.  TRH was asked to admit.    Clinical Impression  Patient demonstrates slightly labored movement for sitting up at bedside, very unsteady on feet demonstrating frequent festinating gait pattern when taking steps requiring frequent verbal cues to take longer steps with fair carryover, limited mostly due to c/o fatigue.  Patient on room air with SpO2 at 92-93% during ambulation and increased to 95-97% after sitting in chair - RN notified.  Patient will benefit from continued physical therapy in hospital and recommended venue below to increase strength, balance, endurance for safe ADLs and  gait.    Follow Up Recommendations SNF;Supervision for mobility/OOB;Supervision/Assistance - 24 hour    Equipment Recommendations  None recommended by PT    Recommendations for Other Services       Precautions / Restrictions Precautions Precautions: Fall Restrictions Weight Bearing Restrictions: No      Mobility  Bed Mobility Overal bed mobility: Needs Assistance Bed Mobility: Supine to Sit     Supine to sit: Min guard     General bed mobility comments: labored movement, slightly increased time    Transfers Overall transfer level: Needs assistance Equipment used: Rolling walker (2 wheeled) Transfers: Sit to/from [facility] Sit to Stand: Min assist Stand pivot transfers: Min assist          Ambulation/Gait Ambulation/Gait assistance: Min assist Gait Distance (Feet): 45 Feet Assistive device: Rolling walker (2 wheeled) Gait Pattern/deviations: Decreased step length - right;Decreased step length - left;Decreased stride length;Festinating Gait velocity: decreased   General Gait Details: slow labored cadence with frequent festinating gait pattern, can take longer steps with verbal cues, but exaggerated and unsafe, limited secondary to fatigue  Stairs            Wheelchair Mobility    Modified Rankin (Stroke Patients Only)       Balance Overall balance assessment: Needs assistance Sitting-balance support: Feet supported;No upper extremity supported Sitting balance-Leahy Scale: Good Sitting balance - Comments: seated at EOB   Standing balance support: During functional activity;Bilateral upper extremity supported Standing balance-Leahy Scale: Fair Standing balance comment: using RW  Pertinent Vitals/Pain Pain Assessment: No/denies pain    Home Living Family/patient expects to be discharged to:: Private residence Living Arrangements: Alone Available Help at Discharge: Family;Personal care  attendant;Available 24 hours/day Type of Home: House Home Access: Stairs to enter Entrance Stairs-Rails: None Entrance Stairs-Number of Steps: no steps into house, 2 steps into dining room, patient sleeps on 2nd floor Home Layout: Two level;Bed/bath upstairs Home Equipment: Cane - single point;Shower seat - built in;Walker - 2 wheels;Grab bars - toilet      Prior Function Level of Independence: Needs assistance   Gait / Transfers Assistance Needed: assisted for household ambulation using RW  ADL's / Homemaking Assistance Needed: assisted by brother and home aides        Hand Dominance   Dominant Hand: Right    Extremity/Trunk Assessment   Upper Extremity Assessment Upper Extremity Assessment: Overall WFL for tasks assessed    Lower Extremity Assessment Lower Extremity Assessment: Generalized weakness    Cervical / Trunk Assessment Cervical / Trunk Assessment: Normal  Communication   Communication: No difficulties  Cognition Arousal/Alertness: Awake/alert Behavior During Therapy: WFL for tasks assessed/performed Overall Cognitive Status: History of cognitive impairments - at baseline                                        General Comments      Exercises     Assessment/Plan    PT Assessment Patient needs continued PT services  PT Problem List Decreased strength;Decreased activity tolerance;Decreased balance;Decreased mobility       PT Treatment Interventions Balance training;Gait training;DME instruction;Stair training;Functional mobility training;Therapeutic activities;Therapeutic exercise;Patient/family education    PT Goals (Current goals can be found in the Care Plan section)  Acute Rehab PT Goals Patient Stated Goal: return home with family and home aides to assist PT Goal Formulation: With patient/family Time For Goal Achievement: 11/21/19 Potential to Achieve Goals: Good    Frequency Min 3X/week   Barriers to discharge         Co-evaluation               AM-PAC PT "6 Clicks" Mobility  Outcome Measure Help needed turning from your back to your side while in a flat bed without using bedrails?: None Help needed moving from lying on your back to sitting on the side of a flat bed without using bedrails?: A Little Help needed moving to and from a bed to a chair (including a wheelchair)?: A Little Help needed standing up from a chair using your arms (e.g., wheelchair or bedside chair)?: A Little Help needed to walk in hospital room?: A Lot Help needed climbing 3-5 steps with a railing? : A Lot 6 Click Score: 17    End of Session   Activity Tolerance: Patient tolerated treatment well;Patient limited by fatigue Patient left: in chair;with call bell/phone within reach;with chair alarm set Nurse Communication: Mobility status PT Visit Diagnosis: Unsteadiness on feet (R26.81);Other abnormalities of gait and mobility (R26.89);Muscle weakness (generalized) (M62.81)    Time: 8466-5993 PT Time Calculation (min) (ACUTE ONLY): 31 min   Charges:   PT Evaluation $PT Eval Moderate Complexity: 1 Mod PT Treatments $Therapeutic Activity: 23-37 mins        1:11 PM, 11/07/19 Lonell Grandchild, MPT Physical Therapist with [facility] [phone] office [phone] mobile phone

## 2019-11-08 DIAGNOSIS — R4182 Altered mental status, unspecified: Secondary | ICD-10-CM

## 2019-11-08 DIAGNOSIS — Z7189 Other specified counseling: Secondary | ICD-10-CM

## 2019-11-08 DIAGNOSIS — F0390 Unspecified dementia without behavioral disturbance: Secondary | ICD-10-CM

## 2019-11-08 DIAGNOSIS — R6520 Severe sepsis without septic shock: Secondary | ICD-10-CM

## 2019-11-08 DIAGNOSIS — Z515 Encounter for palliative care: Secondary | ICD-10-CM

## 2019-11-08 DIAGNOSIS — J9601 Acute respiratory failure with hypoxia: Secondary | ICD-10-CM

## 2019-11-08 DIAGNOSIS — A419 Sepsis, unspecified organism: Principal | ICD-10-CM

## 2019-11-08 LAB — CBC
HCT: 32.7 % — ABNORMAL LOW (ref 36.0–46.0)
Hemoglobin: 10.8 g/dL — ABNORMAL LOW (ref 12.0–15.0)
MCH: 30.3 pg (ref 26.0–34.0)
MCHC: 33 g/dL (ref 30.0–36.0)
MCV: 91.9 fL (ref 80.0–100.0)
Platelets: 81 10*3/uL — ABNORMAL LOW (ref 150–400)
RBC: 3.56 MIL/uL — ABNORMAL LOW (ref 3.87–5.11)
RDW: 14.9 % (ref 11.5–15.5)
WBC: 5.9 10*3/uL (ref 4.0–10.5)
nRBC: 0 % (ref 0.0–0.2)

## 2019-11-08 LAB — BASIC METABOLIC PANEL
Anion gap: 8 (ref 5–15)
BUN: 11 mg/dL (ref 8–23)
CO2: 28 mmol/L (ref 22–32)
Calcium: 9.3 mg/dL (ref 8.9–10.3)
Chloride: 100 mmol/L (ref 98–111)
Creatinine, Ser: 0.47 mg/dL (ref 0.44–1.00)
GFR, Estimated: >60 mL/min (ref >60)
Glucose, Bld: 84 mg/dL (ref 70–99)
Potassium: 3.1 mmol/L — ABNORMAL LOW (ref 3.5–5.1)
Sodium: 136 mmol/L (ref 135–145)

## 2019-11-08 LAB — URINE CULTURE: Culture: >=100000 — AB

## 2019-11-08 LAB — GLUCOSE, CAPILLARY
Glucose-Capillary: 79 mg/dL (ref 70–99)
Glucose-Capillary: 84 mg/dL (ref 70–99)
Glucose-Capillary: 89 mg/dL (ref 70–99)
Glucose-Capillary: 90 mg/dL (ref 70–99)

## 2019-11-08 LAB — MAGNESIUM: Magnesium: 1.6 mg/dL — ABNORMAL LOW (ref 1.7–2.4)

## 2019-11-08 MED ORDER — MAGNESIUM SULFATE 2 GM/50ML IV SOLN: 2.0000 g | Freq: Once | INTRAVENOUS | Status: DC

## 2019-11-08 MED ORDER — GUAIFENESIN ER 600 MG PO TB12: 1200.0000 mg | ORAL_TABLET | Freq: Two times a day (BID) | ORAL | 0 refills | Status: AC

## 2019-11-08 MED ORDER — AMOXICILLIN-POT CLAVULANATE 200-28.5 MG/5ML PO SUSR: 250.0000 mg | Freq: Two times a day (BID) | ORAL | 0 refills | Status: AC

## 2019-11-08 MED ORDER — SODIUM CHLORIDE 0.9 % IV SOLN
1.0000 g | Freq: Three times a day (TID) | INTRAVENOUS | Status: DC
  Administered 2019-11-08: 1 g via INTRAVENOUS
  Filled 2019-11-08: qty 1

## 2019-11-08 MED ORDER — AMLODIPINE BESYLATE 2.5 MG PO TABS: 2.5000 mg | ORAL_TABLET | Freq: Every day | ORAL | 0 refills | Status: DC

## 2019-11-08 MED ORDER — AMOXICILLIN-POT CLAVULANATE 200-28.5 MG/5ML PO SUSR
400.0000 mg | Freq: Two times a day (BID) | ORAL | Status: DC
  Filled 2019-11-08 (×5): qty 10

## 2019-11-08 MED ORDER — POTASSIUM CHLORIDE 20 MEQ/15ML (10%) PO SOLN: 40.0000 meq | Freq: Once | ORAL | Status: DC

## 2019-11-08 MED ORDER — COMBIVENT RESPIMAT 20-100 MCG/ACT IN AERS: 1.0000 | INHALATION_SPRAY | Freq: Four times a day (QID) | RESPIRATORY_TRACT | 0 refills | Status: DC | PRN

## 2019-11-08 NOTE — Discharge Summary (Signed)
Physician Discharge Summary  Cassidy Smith STM:[id number] DOB: 18-Apr-1955 DOA: 11/06/2019  PCP: Neale Burly, MD  Admit date: 11/06/2019  Discharge date: 11/08/2019  Admitted From:Home  Disposition:  Home  Recommendations for Outpatient Follow-up:  1. Follow up with PCP in 1-2 weeks 2. Continue on Augmentin as prescribed for 5 more days to complete total 7-day course of treatment 3. Patient has not required treatment for ESBL noted in urine culture which appears to be colonization 4. Continue other home medications as noted below 5. Follow-up with hospice in outpatient setting for goals of care  Home Health: Yes with PT, caretaker at home with services ongoing  Equipment/Devices: None  Discharge Condition: Stable  CODE STATUS: DNR  Diet recommendation: Dysphagia 1  Brief/Interim Summary: Per HPI: Cassidy Maheu Rockwellis a 64 y.o.femalewith medical history significant forearly onset dementia, dysphagia,intracerebral hematoma, hyperthyroidism, SIADH, recurrent UTIon prophylactic antibiotics, bipolar disorder, chronic anxiety/depression, GI bleed, chronic thrombocytopenia, rectal cancer, chronic DVT, who presented from home to [facility], ED due to worsening altered mental status, minimal interactionof 1 week duration. Associated with intermittent cough, and chills.Unable to obtain history from the patient due to confusion in the setting of dementia. History is mainly obtained from Cassidy Smith, her son via phone, and review of medical records. She receives 8-hour care at [facility] the rest of the time her brother stays with her. Per her caregiver she has noted foul odor to her urine, has been somnolent throughout the day,was found shaking earlier,responds to her name and nothing else. She was brought in for further evaluation. Last healthcareinstitution visit was at least 2 months ago per her son via phone. Work-up in the ED consistent with right lower lobe community-acquired  pneumonia, sepsis code was called. Was started on IV antibiotics empirically and IV fluid. TRH wasasked to admit.  ED Course:Presented with T-max 101.3, heart rate 109, respiration rate 28. Chest x-ray personally reviewed showed right lower lobe infiltrates consistent with right lower lobepneumonia.Lab studies essentially unremarkable.  10/24: Patient was admitted with acute metabolic encephalopathy in the setting of sepsis with concern for right lower lobe pneumonia.  She has been started on vancomycin and cefepime empirically and appears somnolent this morning.  UA negative for UTI and there is concern for dysphagia for which she remains n.p.o. at this time.  10/25: Patient doing quite well this morning and is alert and conversational and denies any complaints or concerns.  She states that she is feeling at her usual baseline and son on the phone confirms the same after having spoken with her this morning.  She no longer requires any oxygen supplementation and appears to be tolerating her dysphagia one diet and has some dysphagia confirmed on SLP evaluation recommending the same on discharge.  She was noted to have some ESBL E. coli growth on urine cultures, but this appears to be pure colonization without any signs of UTI.  She is overall stable for discharge today and to continue on medications as noted below as well as course of Augmentin for 5 more days to complete 7-day course of treatment for her pneumonia.  Extensive discussion had with son regarding the need for palliative/hospice evaluation in the very near future and he has had conversations with palliative care today with recommendations to continue follow-up in the outpatient setting given her dementia and ongoing clinical decline and poor nutritional status.  Discharge Diagnoses:  Active Problems:   CAP (community acquired pneumonia)  Principal discharge diagnosis: Acute metabolic encephalopathy secondary to right lower  lobe  community-acquired pneumonia.  Discharge Instructions  Discharge Instructions    Diet - low sodium heart healthy   Complete by: As directed    Increase activity slowly   Complete by: As directed      Allergies as of 11/08/2019   No Known Allergies     Medication List    STOP taking these medications   cefUROXime 500 MG tablet Commonly known as: CEFTIN   furosemide 20 MG tablet Commonly known as: LASIX   potassium chloride 10 MEQ tablet Commonly known as: KLOR-CON     TAKE these medications   amLODipine 2.5 MG tablet Commonly known as: NORVASC Take 1 tablet (2.5 mg total) by mouth daily. Start taking on: November 09, 2019   amoxicillin-clavulanate 200-28.5 MG/5ML suspension Commonly known as: AUGMENTIN Take 6.3 mLs (250 mg total) by mouth every 12 (twelve) hours for 5 days.   aspirin EC 81 MG tablet Take 81 mg by mouth daily. Swallow whole.   atorvastatin 20 MG tablet Commonly known as: LIPITOR Take 1 tablet (20 mg total) by mouth daily at 6 PM.   benztropine 1 MG tablet Commonly known as: COGENTIN Take 1 tablet (1 mg total) by mouth 2 (two) times daily.   Combivent Respimat 20-100 MCG/ACT Aers respimat Generic drug: Ipratropium-Albuterol Inhale 1 puff into the lungs every 6 (six) hours as needed for wheezing or shortness of breath.   divalproex 500 MG 24 hr tablet Commonly known as: DEPAKOTE ER Take 2 tablets (1,000 mg total) by mouth at bedtime. What changed: how much to take   gabapentin 100 MG capsule Commonly known as: NEURONTIN Take 1 capsule (100 mg total) by mouth 3 (three) times daily. What changed:   how much to take  when to take this  additional instructions   guaiFENesin 600 MG 12 hr tablet Commonly known as: MUCINEX Take 2 tablets (1,200 mg total) by mouth 2 (two) times daily for 7 days.   multivitamin with minerals Tabs tablet Take 1 tablet by mouth daily. Centrum Silver   pantoprazole 40 MG tablet Commonly known as:  PROTONIX Take 40 mg by mouth daily.   potassium chloride 10 MEQ tablet Commonly known as: KLOR-CON Take 1 tablet (10 mEq total) by mouth every other day. What changed: when to take this   risperiDONE 1 MG tablet Commonly known as: RISPERDAL Take 1 tablet (1 mg total) by mouth at bedtime as needed for up to 15 days (agitation).   risperiDONE 2 MG tablet Commonly known as: RISPERDAL Take 2 mg by mouth at bedtime.   traZODone 100 MG tablet Commonly known as: DESYREL Take 100 mg by mouth at bedtime.   vitamin B-12 1000 MCG tablet Commonly known as: CYANOCOBALAMIN Take 1 tablet (1,000 mcg total) by mouth daily.   Vitamin D-3 125 MCG (5000 UT) Tabs Take 5,000 Units by mouth daily.       Follow-up Information    [facility] Follow up.   Specialty: Home Health Services Why: HHPT Contact information: [place] [address] [phone]        Neale Burly, MD Follow up in 1 week(s).   Specialty: Internal Medicine Contact information: [place]  [address] [phone]              No Known Allergies  Consultations:  Palliative care   Procedures/Studies: CT HEAD WO CONTRAST  Result Date: 11/06/2019 CLINICAL DATA:  Altered mental status EXAM: CT HEAD WITHOUT CONTRAST  TECHNIQUE: Contiguous axial images were obtained from the base of the skull through the vertex without intravenous contrast. COMPARISON:  07/04/2019 FINDINGS: Brain: Previously seen parenchymal hemorrhage in the left temporal lobe has nearly completely resolved. Mild chronic white matter ischemic changes are noted. No findings to suggest acute hemorrhage, acute infarction or space-occupying mass lesion are noted. Mild atrophic changes are noted as well. Vascular: No hyperdense vessel or unexpected calcification. Skull: Normal. Negative for fracture or focal lesion. Sinuses/Orbits: No acute finding. Other: None. IMPRESSION: Previously seen parenchymal  hemorrhage in the right temporal lobe and subarachnoid hemorrhage has nearly completely resolved in the interval. No new hemorrhage is noted. 7 Chronic atrophic and ischemic changes. Electronically Signed   By: Inez Catalina M.D.   On: 11/06/2019 21:53   DG Chest Port 1 View  Result Date: 11/06/2019 CLINICAL DATA:  Possible sepsis EXAM: PORTABLE CHEST 1 VIEW COMPARISON:  07/03/2019 FINDINGS: Cardiac shadow is within normal limits. Aortic calcifications are noted. Lungs are well aerated bilaterally. Mild increased interstitial markings are noted on the right. Additionally some new basilar infiltrate is seen medially. IMPRESSION: Increase in right basilar infiltrate with stable interstitial markings from the prior exam. Electronically Signed   By: Inez Catalina M.D.   On: 11/06/2019 18:25     Discharge Exam: Vitals:   11/08/19 0555 11/08/19 0807  BP: (!) 174/83   Pulse: 80   Resp: 18   Temp: 98 F (36.7 C)   SpO2: 96% 97%   Vitals:   11/07/19 1946 11/07/19 2058 11/08/19 0555 11/08/19 0807  BP:  135/78 (!) 174/83   Pulse: 73 79 80   Resp: 18 18 18    Temp:  97.8 F (36.6 C) 98 F (36.7 C)   TempSrc:  Oral Oral   SpO2: 98%  96% 97%  Weight:      Height:        General: Pt is alert, awake, not in acute distress Cardiovascular: RRR, S1/S2 +, no rubs, no gallops Respiratory: CTA bilaterally, no wheezing, no rhonchi Abdominal: Soft, NT, ND, bowel sounds + Extremities: no edema, no cyanosis    The results of significant diagnostics from this hospitalization (including imaging, microbiology, ancillary and laboratory) are listed below for reference.     Microbiology: Recent Results (from the past 240 hour(s))  Blood Culture (routine x 2)     Status: None (Preliminary result)   Collection Time: 11/06/19  5:50 PM   Specimen: Right Antecubital; Blood  Result Value Ref Range Status   Specimen Description RIGHT ANTECUBITAL  Final   Special Requests   Final    BOTTLES DRAWN AEROBIC  AND ANAEROBIC Blood Culture adequate volume   Culture   Final    NO GROWTH 2 DAYS Performed at [facility], [address] [place], [place] [address]    Report Status PENDING  Incomplete  Urine culture     Status: Abnormal   Collection Time: 11/06/19  5:52 PM   Specimen: In/Out Cath Urine  Result Value Ref Range Status   Specimen Description   Final    IN/OUT CATH URINE Performed at [facility], [address], [place], [place] [address]    Special Requests   Final    NONE Performed at [facility], [address], [place], [place] [address]    Culture (A)  Final    >=100,000 COLONIES/mL ESCHERICHIA COLI Confirmed Extended Spectrum Beta-Lactamase Producer (ESBL).  In bloodstream infections from ESBL organisms, carbapenems are preferred over piperacillin/tazobactam. They are shown  to have a lower risk of mortality.    Report Status 11/08/2019 FINAL  Final   Organism ID, Bacteria ESCHERICHIA COLI (A)  Final      Susceptibility   Escherichia coli - MIC*    AMPICILLIN >=32 RESISTANT Resistant     CEFAZOLIN >=64 RESISTANT Resistant     CEFTRIAXONE >=64 RESISTANT Resistant     CIPROFLOXACIN >=4 RESISTANT Resistant     GENTAMICIN <=1 SENSITIVE Sensitive     IMIPENEM <=0.25 SENSITIVE Sensitive     NITROFURANTOIN <=16 SENSITIVE Sensitive     TRIMETH/SULFA >=320 RESISTANT Resistant     AMPICILLIN/SULBACTAM >=32 RESISTANT Resistant     PIP/TAZO 8 SENSITIVE Sensitive     * >=100,000 COLONIES/mL ESCHERICHIA COLI  Blood Culture (routine x 2)     Status: None (Preliminary result)   Collection Time: 11/06/19  6:24 PM   Specimen: BLOOD RIGHT ARM  Result Value Ref Range Status   Specimen Description BLOOD RIGHT ARM  Final   Special Requests   Final    BOTTLES DRAWN AEROBIC AND ANAEROBIC Blood Culture adequate volume   Culture   Final    NO GROWTH 2 DAYS Performed at [facility], [address] [place], [place] [address]    Report Status PENDING  Incomplete  Respiratory Panel by RT PCR  (Flu A&B, Covid) - Nasopharyngeal Swab     Status: None   Collection Time: 11/06/19  6:30 PM   Specimen: Nasopharyngeal Swab  Result Value Ref Range Status   SARS Coronavirus 2 by RT PCR NEGATIVE NEGATIVE Final    Comment: (NOTE) SARS-CoV-2 target nucleic acids are NOT DETECTED.  The SARS-CoV-2 RNA is generally detectable in upper respiratoy specimens during the acute phase of infection. The lowest concentration of SARS-CoV-2 viral copies this assay can detect is 131 copies/mL. A negative result does not preclude SARS-Cov-2 infection and should not be used as the sole basis for treatment or other patient management decisions. A negative result may occur with  improper specimen collection/handling, submission of specimen other than nasopharyngeal swab, presence of viral mutation(s) within the areas targeted by this assay, and inadequate number of viral copies (<131 copies/mL). A negative result must be combined with clinical observations, patient history, and epidemiological information. The expected result is Negative.  Fact Sheet for Patients:  PinkCheek.be  Fact Sheet for Healthcare Providers:  GravelBags.it  This test is no t yet approved or cleared by the [place] FDA and  has been authorized for detection and/or diagnosis of SARS-CoV-2 by FDA under an Emergency Use Authorization (EUA). This EUA will remain  in effect (meaning this test can be used) for the duration of the COVID-19 declaration under Section 564(b)(1) of the Act, 21 U.S.C. section 360bbb-3(b)(1), unless the authorization is terminated or revoked sooner.     Influenza A by PCR NEGATIVE NEGATIVE Final   Influenza B by PCR NEGATIVE NEGATIVE Final    Comment: (NOTE) The Xpert Xpress SARS-CoV-2/FLU/RSV assay is intended as an aid in  the diagnosis of influenza from Nasopharyngeal swab specimens and  should not be used as a sole basis for treatment.  Nasal washings and  aspirates are unacceptable for Xpert Xpress SARS-CoV-2/FLU/RSV  testing.  Fact Sheet for Patients: PinkCheek.be  Fact Sheet for Healthcare Providers: GravelBags.it  This test is not yet approved or cleared by the [place] FDA and  has been authorized for detection and/or diagnosis of SARS-CoV-2 by  FDA under an Emergency Use Authorization (EUA). This EUA will remain  in effect (meaning this test can be used) for the duration of the  Covid-19 declaration under Section 564(b)(1) of the Act, 21  U.S.C. section 360bbb-3(b)(1), unless the authorization is  terminated or revoked. Performed at [facility], [address] [place], [place] [address]      Labs: BNP (last 3 results) No results for input(s): BNP in the last 8760 hours. Basic Metabolic Panel: Recent Labs  Lab 11/06/19 1750 11/07/19 0704 11/08/19 0558  NA 139 134* 136  K 3.6 3.6 3.1*  CL 99 98 100  CO2 29 29 28   GLUCOSE 108* 106* 84  BUN 19 12 11   CREATININE 0.75 0.53 0.47  CALCIUM 9.8 9.4 9.3  MG  --  1.3* 1.6*  PHOS  --  2.6  --    Liver Function Tests: Recent Labs  Lab 11/06/19 1750 11/07/19 0704  AST 19 21  ALT 11 11  ALKPHOS 82 71  BILITOT 0.7 0.7  PROT 7.2 6.5  ALBUMIN 3.6 3.1*   No results for input(s): LIPASE, AMYLASE in the last 168 hours. Recent Labs  Lab 11/06/19 1824  AMMONIA 22   CBC: Recent Labs  Lab 11/06/19 1750 11/07/19 0704 11/08/19 0558  WBC 8.4 11.8* 5.9  NEUTROABS 5.6 7.9*  --   HGB 12.6 12.2 10.8*  HCT 38.0 37.4 32.7*  MCV 92.7 93.0 91.9  PLT 89* 77* 81*   Cardiac Enzymes: No results for input(s): CKTOTAL, CKMB, CKMBINDEX, TROPONINI in the last 168 hours. BNP: Invalid input(s): POCBNP CBG: Recent Labs  Lab 11/07/19 2055 11/08/19 0029 11/08/19 0412 11/08/19 0753 11/08/19 1055  GLUCAP 110* 89 90 79 84   D-Dimer No results for input(s): DDIMER in the last 72 hours. Hgb A1c No  results for input(s): HGBA1C in the last 72 hours. Lipid Profile No results for input(s): CHOL, HDL, LDLCALC, TRIG, CHOLHDL, LDLDIRECT in the last 72 hours. Thyroid function studies Recent Labs    11/06/19 1824  TSH 1.216   Anemia work up No results for input(s): VITAMINB12, FOLATE, FERRITIN, TIBC, IRON, RETICCTPCT in the last 72 hours. Urinalysis    Component Value Date/Time   COLORURINE YELLOW 11/06/2019 1750   APPEARANCEUR CLEAR 11/06/2019 1750   LABSPEC 1.010 11/06/2019 1750   PHURINE 8.0 11/06/2019 1750   GLUCOSEU NEGATIVE 11/06/2019 1750   HGBUR NEGATIVE 11/06/2019 1750   BILIRUBINUR NEGATIVE 11/06/2019 1750   KETONESUR 5 (A) 11/06/2019 1750   PROTEINUR NEGATIVE 11/06/2019 1750   UROBILINOGEN 0.2 05/12/2013 0210   NITRITE NEGATIVE 11/06/2019 1750   LEUKOCYTESUR NEGATIVE 11/06/2019 1750   Sepsis Labs Invalid input(s): PROCALCITONIN,  WBC,  LACTICIDVEN Microbiology Recent Results (from the past 240 hour(s))  Blood Culture (routine x 2)     Status: None (Preliminary result)   Collection Time: 11/06/19  5:50 PM   Specimen: Right Antecubital; Blood  Result Value Ref Range Status   Specimen Description RIGHT ANTECUBITAL  Final   Special Requests   Final    BOTTLES DRAWN AEROBIC AND ANAEROBIC Blood Culture adequate volume   Culture   Final    NO GROWTH 2 DAYS Performed at [facility], [address] [place], [place] [address]    Report Status PENDING  Incomplete  Urine culture     Status: Abnormal   Collection Time: 11/06/19  5:52 PM   Specimen: In/Out Cath Urine  Result Value Ref Range Status   Specimen Description   Final    IN/OUT CATH URINE Performed at [facility], [address]  [place], [place] [address]    Special Requests   Final    NONE Performed at [facility], [address], [place], [place] [address]    Culture (A)  Final    >=100,000 COLONIES/mL ESCHERICHIA COLI Confirmed Extended Spectrum Beta-Lactamase Producer (ESBL).  In bloodstream  infections from ESBL organisms, carbapenems are preferred over piperacillin/tazobactam. They are shown to have a lower risk of mortality.    Report Status 11/08/2019 FINAL  Final   Organism ID, Bacteria ESCHERICHIA COLI (A)  Final      Susceptibility   Escherichia coli - MIC*    AMPICILLIN >=32 RESISTANT Resistant     CEFAZOLIN >=64 RESISTANT Resistant     CEFTRIAXONE >=64 RESISTANT Resistant     CIPROFLOXACIN >=4 RESISTANT Resistant     GENTAMICIN <=1 SENSITIVE Sensitive     IMIPENEM <=0.25 SENSITIVE Sensitive     NITROFURANTOIN <=16 SENSITIVE Sensitive     TRIMETH/SULFA >=320 RESISTANT Resistant     AMPICILLIN/SULBACTAM >=32 RESISTANT Resistant     PIP/TAZO 8 SENSITIVE Sensitive     * >=100,000 COLONIES/mL ESCHERICHIA COLI  Blood Culture (routine x 2)     Status: None (Preliminary result)   Collection Time: 11/06/19  6:24 PM   Specimen: BLOOD RIGHT ARM  Result Value Ref Range Status   Specimen Description BLOOD RIGHT ARM  Final   Special Requests   Final    BOTTLES DRAWN AEROBIC AND ANAEROBIC Blood Culture adequate volume   Culture   Final    NO GROWTH 2 DAYS Performed at [facility], [address], [place], [place] [address]    Report Status PENDING  Incomplete  Respiratory Panel by RT PCR (Flu A&B, Covid) - Nasopharyngeal Swab     Status: None   Collection Time: 11/06/19  6:30 PM   Specimen: Nasopharyngeal Swab  Result Value Ref Range Status   SARS Coronavirus 2 by RT PCR NEGATIVE NEGATIVE Final    Comment: (NOTE) SARS-CoV-2 target nucleic acids are NOT DETECTED.  The SARS-CoV-2 RNA is generally detectable in upper respiratoy specimens during the acute phase of infection. The lowest concentration of SARS-CoV-2 viral copies this assay can detect is 131 copies/mL. A negative result does not preclude SARS-Cov-2 infection and should not be used as the sole basis for treatment or other patient management decisions. A negative result may occur with  improper specimen  collection/handling, submission of specimen other than nasopharyngeal swab, presence of viral mutation(s) within the areas targeted by this assay, and inadequate number of viral copies (<131 copies/mL). A negative result must be combined with clinical observations, patient history, and epidemiological information. The expected result is Negative.  Fact Sheet for Patients:  PinkCheek.be  Fact Sheet for Healthcare Providers:  GravelBags.it  This test is no t yet approved or cleared by the [place] FDA and  has been authorized for detection and/or diagnosis of SARS-CoV-2 by FDA under an Emergency Use Authorization (EUA). This EUA will remain  in effect (meaning this test can be used) for the duration of the COVID-19 declaration under Section 564(b)(1) of the Act, 21 U.S.C. section 360bbb-3(b)(1), unless the authorization is terminated or revoked sooner.     Influenza A by PCR NEGATIVE NEGATIVE Final   Influenza B by PCR NEGATIVE NEGATIVE Final    Comment: (NOTE) The Xpert Xpress SARS-CoV-2/FLU/RSV assay is intended as an aid in  the diagnosis of influenza from Nasopharyngeal swab specimens and  should not be used as a sole basis for treatment. Nasal washings and  aspirates are unacceptable for Xpert Xpress SARS-CoV-2/FLU/RSV  testing.  Fact Sheet for Patients: PinkCheek.be  Fact Sheet for Healthcare Providers: GravelBags.it  This test is not yet approved or cleared by the [place] FDA and  has been authorized for detection and/or diagnosis of SARS-CoV-2 by  FDA under an Emergency Use Authorization (EUA). This EUA will remain  in effect (meaning this test can be used) for the duration of the  Covid-19 declaration under Section 564(b)(1) of the Act, 21  U.S.C. section 360bbb-3(b)(1), unless the authorization is  terminated or revoked. Performed at [facility], [address] [place], [place] [address]      Time coordinating discharge: 35 minutes  SIGNED:   Rodena Goldmann, DO [facility] 11/08/2019, 1:16 PM  If 7PM-7AM, please contact night-coverage [email or web address]

## 2019-11-08 NOTE — Consult Note (Signed)
Consultation Note Date: 11/08/2019   Patient Name: Cassidy Smith  DOB: 01-Apr-1955  MRN: [id number]  Age / Sex: 64 y.o., female  PCP: Neale Burly, MD Referring Physician: Rodena Goldmann, DO  Reason for Consultation: Establishing goals of care  HPI/Patient Profile: 64 y.o. female  with past medical history of early onset dementia, dysphagia, intracerebral hematoma, hyperthyroidism, SIADH, recurrent UTI's, bipolar disorder, anxiety/depression, GI bleeding, chronic thrombocytopenia, rectal cancer, DVT admitted on 11/06/2019 with altered mental status. Hospital admission for acute metabolic encephalopathy secondary to RLL pneumonia, sepsis, dysphagia, progressive dementia, and generalized weakness/debility. Palliative medicine consultation for goals of care.   Clinical Assessment and Goals of Care:  I have reviewed medical records, discussed with Dr. Manuella Ghazi and met patient at bedside. She is oriented to self and 'hospital' otherwise disoriented with baseline dementia. She is pleasant and in good spirits this afternoon. She is sitting up in recliner. Denies pain or complaints. No family at bedside.   Spoke with son, Afnan Cadiente who resides in [place]. He is his mother's healthcare POA. Only child.   I introduced Palliative Medicine as specialized medical care for people living with serious illness. It focuses on providing relief from the symptoms and stress of a serious illness. The goal is to improve quality of life for both the patient and the family.  Prior to admission, patient living home with brother [name]). She has caregivers 7 days a week for 8 hours a day. Another brother, [name] who is supportive and checks in to assist [name] since he is not in excellent health either. Thedore Mins confirms early onset dementia a few years ago. Brother, [name] reports health decline in the last month.   Discussed events leading up  to admission and course of hospitalization including diagnoses, interventions, plan of care. Discussed expectations and disease trajectory of dementia.   I attempted to elicit values and goals of care important to the patient. Advanced directives, concepts specific to code status, artifical feeding and hydration, and rehospitalization were considered and discussed. Thedore Mins confirms DNR code status. He is ready to complete electronic MOST form.   Discussed and completed electronic [employer] MOST form with son Thedore Mins). Decisions include: DNR/DNI, limited additional interventions including recurrent hospitalization if indicated to treat the treatable, non-invasive forms of oxygen such as BiPAP/CPAP if indicated, IVF/ABX if indicated, and time trial of feeding tube. Medically recommended against feeding tube placement as her dementia progresses. Explained ongoing discussions when they are faced with this decision. Thedore Mins understands and agrees.   Thedore Mins confirms plan for discharge home with home health services.  Palliative Care services outpatient were explained and offered. Son agreeable.   Questions and concerns were addressed. PMT contact information given.     SUMMARY OF RECOMMENDATIONS    Only child, Gao Mitnick is report HCPOA. He resides in [place]. Patient lives with brother, [name]. She has caregiver support 7 days a week.  Son confirms DNR code status.   Electronic [facility] MOST form completed. Decisions include: DNR/DNI, limited additional interventions including re-hospitalization if  necessary, IVF/ABX if indicated, and feeding tube for trial period. Medically recommended against feeding tube placement in patient's with advanced dementia. Ongoing discussions when family is faced with this decision.   Continue current plan of care and medical management.  Discharge plan is home with home health. Son agreeable with outpatient palliative f/u.   Code Status/Advance Care Planning:  DNR  Symptom  Management:   Per attending  Palliative Prophylaxis:   Aspiration, Delirium Protocol, Frequent Pain Assessment, Oral Care and Turn Reposition  Psycho-social/Spiritual:   Desire for further Chaplaincy support:yes  Additional Recommendations: Caregiving  Support/Resources, Compassionate Wean Education and Education on Hospice  Prognosis:   Unable to determine  Discharge Planning: Home with Home Health, outpatient palliative referral.      Primary Diagnoses: Present on Admission: . CAP (community acquired pneumonia)   I have reviewed the medical record, interviewed the patient and family, and examined the patient. The following aspects are pertinent.  Past Medical History:  Diagnosis Date  . Anxiety   . Bipolar 1 disorder ([place])   . Chronic hip pain   . Chronic kidney disease    uti  currently,  hx bladder spasms  . Depression   . Headache(784.0)   . Hyperthyroidism   . Ischemic stroke ([place])   . Neuromuscular disorder (HCC)    shaking of hands   . SIADH (syndrome of inappropriate ADH production) ([place]) 04/08/2017   Social History   Socioeconomic History  . Marital status: Divorced    Spouse name: Not on file  . Number of children: Not on file  . Years of education: Not on file  . Highest education level: Not on file  Occupational History  . Not on file  Tobacco Use  . Smoking status: Former Smoker    Packs/day: 0.50    Years: 20.00    Pack years: 10.00    Types: Cigarettes    Quit date: 05/05/2017    Years since quitting: 2.5  . Smokeless tobacco: Never Used  . Tobacco comment: patient states she quit one week ago  Vaping Use  . Vaping Use: Never used  Substance and Sexual Activity  . Alcohol use: No  . Drug use: No  . Sexual activity: Not on file  Other Topics Concern  . Not on file  Social History Narrative  . Not on file   Social Determinants of Health   Financial Resource Strain:   . Difficulty of Paying Living Expenses: Not on file  Food  Insecurity:   . Worried About Charity fundraiser in the Last Year: Not on file  . Ran Out of Food in the Last Year: Not on file  Transportation Needs:   . Lack of Transportation (Medical): Not on file  . Lack of Transportation (Non-Medical): Not on file  Physical Activity:   . Days of Exercise per Week: Not on file  . Minutes of Exercise per Session: Not on file  Stress:   . Feeling of Stress : Not on file  Social Connections:   . Frequency of Communication with Friends and Family: Not on file  . Frequency of Social Gatherings with Friends and Family: Not on file  . Attends Religious Services: Not on file  . Active Member of Clubs or Organizations: Not on file  . Attends Archivist Meetings: Not on file  . Marital Status: Not on file   Family History  Problem Relation Age of Onset  . Breast cancer Mother   . Cancer -  Other Mother   . Alcoholism Father   . Colon cancer Neg Hx   . Colon polyps Neg Hx    Scheduled Meds: . amLODipine  2.5 mg Oral Daily  . aspirin EC  81 mg Oral Daily  . atorvastatin  20 mg Oral q1800  . enoxaparin (LOVENOX) injection  40 mg Subcutaneous Q24H  . guaiFENesin  1,200 mg Oral BID  . ipratropium  0.5 mg Nebulization TID  . levalbuterol  0.63 mg Nebulization TID  . pantoprazole  40 mg Oral Daily  . potassium chloride  40 mEq Oral Once   Continuous Infusions: . sodium chloride 75 mL/hr at 11/08/19 0600  . magnesium sulfate bolus IVPB     PRN Meds:.acetaminophen, levalbuterol, ondansetron (ZOFRAN) IV Medications Prior to Admission:  Prior to Admission medications   Medication Sig Start Date End Date Taking? Authorizing Provider  aspirin EC 81 MG tablet Take 81 mg by mouth daily. Swallow whole.   Yes [provider]  atorvastatin (LIPITOR) 20 MG tablet Take 1 tablet (20 mg total) by mouth daily at 6 PM. 06/11/17  Yes Barton Dubois, MD  Cholecalciferol (VITAMIN D-3) 125 MCG (5000 UT) TABS Take 5,000 Units by mouth daily.   Yes  [provider]  divalproex (DEPAKOTE ER) 500 MG 24 hr tablet Take 2 tablets (1,000 mg total) by mouth at bedtime. Patient taking differently: Take 1,500 mg by mouth at bedtime.  08/16/17  Yes Tat, Shanon Brow, MD  furosemide (LASIX) 20 MG tablet Take 0.5 tablets (10 mg total) by mouth every other day. Patient taking differently: Take 10 mg by mouth daily.  01/08/18  Yes Johnson, Clanford L, MD  gabapentin (NEURONTIN) 100 MG capsule Take 1 capsule (100 mg total) by mouth 3 (three) times daily. Patient taking differently: Take 100-200 mg by mouth See admin instructions. Take one capsule (100 mg) by mouth every morning and two capsules (200 mg) at night 05/13/17  Yes Carole Civil, MD  Multiple Vitamin (MULTIVITAMIN WITH MINERALS) TABS tablet Take 1 tablet by mouth daily. Centrum Silver   Yes [provider]  pantoprazole (PROTONIX) 40 MG tablet Take 40 mg by mouth daily.   Yes [provider]  potassium chloride (K-DUR,KLOR-CON) 10 MEQ tablet Take 1 tablet (10 mEq total) by mouth every other day. Patient taking differently: Take 10 mEq by mouth daily.  01/08/18  Yes Johnson, Clanford L, MD  risperiDONE (RISPERDAL) 2 MG tablet Take 2 mg by mouth at bedtime. 06/21/19  Yes [provider]  traZODone (DESYREL) 100 MG tablet Take 100 mg by mouth at bedtime.    Yes [provider]  benztropine (COGENTIN) 1 MG tablet Take 1 tablet (1 mg total) by mouth 2 (two) times daily. Patient not taking: Reported on 11/06/2019 01/05/18   Murlean Iba, MD  cefUROXime (CEFTIN) 500 MG tablet Take 500 mg by mouth 2 (two) times daily. Patient not taking: Reported on 11/06/2019 08/18/19   [provider]  potassium chloride (KLOR-CON) 10 MEQ tablet Take 10 mEq by mouth daily. Patient not taking: Reported on 11/06/2019 10/19/19   [provider]  risperiDONE (RISPERDAL) 1 MG tablet Take 1 tablet (1 mg total) by mouth at bedtime as needed for up to 15 days  (agitation). Patient not taking: Reported on 07/03/2019 01/05/18 01/20/18  Murlean Iba, MD  vitamin B-12 (CYANOCOBALAMIN) 1000 MCG tablet Take 1 tablet (1,000 mcg total) by mouth daily. Patient not taking: Reported on 11/06/2019 06/11/17   Barton Dubois, MD  No Known Allergies Review of Systems  Unable to perform ROS: Dementia   Physical Exam Vitals and nursing note reviewed.  Constitutional:      General: She is awake.     Appearance: She is cachectic. She is ill-appearing.  HENT:     Head: Normocephalic and atraumatic.  Pulmonary:     Effort: No tachypnea, accessory muscle usage or respiratory distress.  Abdominal:     Tenderness: There is no abdominal tenderness.  Skin:    General: Skin is warm and dry.     Coloration: Skin is pale.  Neurological:     Mental Status: She is alert.     Comments: Oriented to person and hospital, pleasant confusion with baseline dementia    Vital Signs: BP (!) 174/83 (BP Location: Left Arm)   Pulse 80   Temp 98 F (36.7 C) (Oral)   Resp 18   Ht 5' 6"  (1.676 m)   Wt 54.6 kg   SpO2 97%   BMI 19.43 kg/m  Pain Scale: 0-10   Pain Score: 0-No pain   SpO2: SpO2: 97 % O2 Device:SpO2: 97 % O2 Flow Rate: .O2 Flow Rate (L/min): 2 L/min  IO: Intake/output summary:   Intake/Output Summary (Last 24 hours) at 11/08/2019 1252 Last data filed at 11/08/2019 1024 Gross per 24 hour  Intake 1519.2 ml  Output 1850 ml  Net -330.8 ml    LBM: Last BM Date: 11/08/19 Baseline Weight: Weight: 49 kg Most recent weight: Weight: 54.6 kg     Palliative Assessment/Data: PPS 50%     Time Total: 60 Greater than 50%  of this time was spent counseling and coordinating care related to the above assessment and plan.  Signed by:  Ihor Dow, DNP, FNP-C Palliative Medicine Team  Phone: [phone] Fax: [phone]   Please contact Palliative Medicine Team phone at [phone] for questions and concerns.  For individual provider: See  Shea Evans

## 2019-11-08 NOTE — Progress Notes (Signed)
[facility] ([facility])  Hospital Liaison: RN note         Notified by TOC manager of patient/family request for [facility] Palliative services at home after discharge.                  [facility] Palliative team will follow up with patient after discharge.         Please call with any hospice or palliative related questions.         Thank you for this referral.         Rondalyn Anne Robertson, RN, CCM  [facility] Hospital Liaison (listed on [facility] under Hospice/[facility])    [phone]   

## 2019-11-08 NOTE — TOC Transition Note (Signed)
Transition of Care [facility]) - CM/SW Discharge Note   Patient Details  Name: Cassidy Smith MRN: [id number] Date of Birth: Dec 15, 1955  Transition of Care [facility]) CM/SW Contact:  Iona Beard, [place] Phone Number: [phone] 11/08/2019, 1:18 PM   Clinical Narrative:    CSW spoke with pts son Natarsha Hurwitz to confirm interest in outpatient palliative referral. Pts son agreeable to referral and does not have preference for company. CSW to make referral to [place]. CSW spoke with Farrel Gordon [phone], she accepted the referral and her team will reach out to the pt and pts family. CSW updated pts son on discharge today and he states he is aware and the care taker Davy Pique [phone]) is aware also. CSW added [facility] to the pts AVS. CSW scheduled EMS for pickup and completed medical necessity form and printed it to the nurses station. CSW updated Attending Dr. Manuella Ghazi and RN. TOC signing off.     Final next level of care: [place] Barriers to Discharge: Barriers Resolved   Patient Goals and CMS Choice Patient states their goals for this hospitalization and ongoing recovery are:: Return home with home health and outpatient palliative.   Choice offered to / list presented to : NA  Discharge Placement                  Name of family member notified: Dutch Gray Patient and family notified of of transfer: 11/08/19  Discharge Plan and Services                DME Arranged: N/A DME Agency: NA       HH Arranged: PT [place] Agency: [facility] Date [place]: 11/07/19   Representative spoke with at [place]: [place] ([place]) Interventions     Readmission Risk Interventions No flowsheet data found.

## 2019-11-08 NOTE — Progress Notes (Signed)
OT Cancellation Note  Patient Details Name: Cassidy Smith MRN: [id number] DOB: Oct 30, 1955   Cancelled Treatment:    Reason Eval/Treat Not Completed: Patient at procedure or test/ unavailable. Speech therapy currently working with patient when attempting to complete OT evaluation. Will re-attempt at a later time.    Ailene Ravel, OTR/L,CBIS  [phone]  11/08/2019, 9:07 AM

## 2019-11-08 NOTE — Evaluation (Signed)
Clinical/Bedside Swallow Evaluation Patient Details  Name: Cassidy Smith MRN: [id number] Date of Birth: August 07, 1955  Today's Date: 11/08/2019 Time: SLP Start Time (ACUTE ONLY): 0830 SLP Stop Time (ACUTE ONLY): 0858 SLP Time Calculation (min) (ACUTE ONLY): 28 min  Past Medical History:  Past Medical History:  Diagnosis Date  . Anxiety   . Bipolar 1 disorder ([place])   . Chronic hip pain   . Chronic kidney disease    uti  currently,  hx bladder spasms  . Depression   . Headache(784.0)   . Hyperthyroidism   . Ischemic stroke ([place])   . Neuromuscular disorder (HCC)    shaking of hands   . SIADH (syndrome of inappropriate ADH production) ([place]) 04/08/2017   Past Surgical History:  Past Surgical History:  Procedure Laterality Date  . COLONOSCOPY WITH PROPOFOL N/A 04/05/2017   Procedure: COLONOSCOPY WITH PROPOFOL;  Surgeon: Rogene Houston, MD;  Location: [facility] ENDO [facility]ITE;  Service: Endoscopy;  Laterality: N/A;  . INTRAMEDULLARY (IM) NAIL INTERTROCHANTERIC Right 03/07/2017   Procedure: OPEN TREATMENT INTERNAL FIXATION RIGHT HIP WITH GAMA INTRAMEDULARY NAIL;  Surgeon: Carole Civil, MD;  Location: [facility] ORS;  Service: Orthopedics;  Laterality: Right;  . MULTIPLE EXTRACTIONS WITH ALVEOLOPLASTY N/A 10/30/2012   Procedure: MULTIPLE EXTRACION 5, 6, 8, 9, 10 ,18, 19, 31 WITH MAXILLARY RIGHT AND LEFT  ALVEOLOPLASTY REDUCE MAXILLARY LEFT TUBEROSITY;  Surgeon: Gae Bon, DDS;  Location: [place];  Service: Oral Surgery;  Laterality: N/A;  . POLYPECTOMY  04/05/2017   Procedure: POLYPECTOMY;  Surgeon: Rogene Houston, MD;  Location: [facility];  Service: Endoscopy;;  recto-sigmoid, rectum   HPI:  Cassidy Smith is a 64 y.o. female with medical history significant for early onset dementia, dysphagia, intracerebral hematoma, hyperthyroidism, SIADH, recurrent UTI on prophylactic antibiotics, bipolar disorder, chronic anxiety/depression, GI bleed, chronic thrombocytopenia, rectal cancer, chronic DVT,  who presented from home to [facility], ED due to worsening altered mental status, minimal interaction of 1 week duration.  Associated with intermittent cough, and chills.  Unable to obtain history from the patient due to confusion in the setting of dementia.  History is mainly obtained from [name], her son via phone, and review of medical records.  She receives 8-hour care at home and the rest of the time her brother stays with her.  Per her caregiver she has noted foul odor to her urine, has been somnolent throughout the day, was found shaking earlier, responds to her name and nothing else.  She was brought in for further evaluation.  Last healthcare institution visit was at least 2 months ago per her son via phone.  Work-up in the ED consistent with right lower lobe community-acquired pneumonia, sepsis code was called.  Was started on IV antibiotics empirically and IV fluid.  TRH was asked to admit.   Assessment / Plan / Recommendation Clinical Impression  Clinical swallow evaluation completed at bedside. Pt reports that she mostly eats in her bed at home and consumes chopped solids, regular liquids, and takes her medications whole and crushed. She previously wore dentures, but indicates that she no longer has them. Oral motor examination is unremarkable otherwise. Volitional cough is strong. Pt consumed ice chips, thin water via tsp/cup/straw, puree, and mechanical soft textures. Pt with one delayed cough and intermittent throat clearing during consumption of breakfast meal (self fed after SLP set up). Vocal quality remained clear. Pt with prolonged oral transit with soft solids (fruit cup and small pieces of graham cracker). Pt  verbally stated that the puree dishes were easier for her to consume. Will continue diet as ordered and discuss with family (could consider D2). SLP suggested that she purchase an immersion blender to chop solids at home. Above to RN. SLP will follow during acute stay.    SLP Visit  Diagnosis: Dysphagia, unspecified (R13.10)    Aspiration Risk  Mild aspiration risk    Diet Recommendation Dysphagia 1 (Puree);Thin liquid   Liquid Administration via: Cup;Straw Medication Administration: Whole meds with liquid (sometimes with applesauce per Pt) Supervision: Patient able to self feed;Intermittent supervision to cue for compensatory strategies Compensations: Slow rate;Small sips/bites Postural Changes: Seated upright at 90 degrees;Remain upright for at least 30 minutes after po intake    Other  Recommendations Oral Care Recommendations: Oral care BID;Staff/trained caregiver to provide oral care Other Recommendations: Clarify dietary restrictions   Follow up Recommendations  (pending)      Frequency and Duration min 2x/week  1 week       Prognosis Prognosis for Safe Diet Advancement: Fair Barriers to Reach Goals:  (edentulous)      Swallow Study   General Date of Onset: 11/06/19 HPI: Cassidy Smith is a 64 y.o. female with medical history significant for early onset dementia, dysphagia, intracerebral hematoma, hyperthyroidism, SIADH, recurrent UTI on prophylactic antibiotics, bipolar disorder, chronic anxiety/depression, GI bleed, chronic thrombocytopenia, rectal cancer, chronic DVT, who presented from home to [facility], ED due to worsening altered mental status, minimal interaction of 1 week duration.  Associated with intermittent cough, and chills.  Unable to obtain history from the patient due to confusion in the setting of dementia.  History is mainly obtained from [place], her son via phone, and review of medical records.  She receives 8-hour care at home and the rest of the time her brother stays with her.  Per her caregiver she has noted foul odor to her urine, has been somnolent throughout the day, was found shaking earlier, responds to her name and nothing else.  She was brought in for further evaluation.  Last healthcare institution visit was at least 2 months  ago per her son via phone.  Work-up in the ED consistent with right lower lobe community-acquired pneumonia, sepsis code was called.  Was started on IV antibiotics empirically and IV fluid.  TRH was asked to admit. Type of Study: Bedside Swallow Evaluation Previous Swallow Assessment: BSE June 2021 D2/thin Diet Prior to this Study: Thin liquids;Dysphagia 1 (puree) Temperature Spikes Noted: No Respiratory Status: Room air History of Recent Intubation: No Behavior/Cognition: Alert;Cooperative;Pleasant mood Oral Cavity Assessment: Within Functional Limits Oral Care Completed by SLP: No Oral Cavity - Dentition: Edentulous Vision: Functional for self-feeding Self-Feeding Abilities: Able to feed self;Needs set up (tremors with self feeding) Patient Positioning: Upright in bed Baseline Vocal Quality: Normal Volitional Cough: Strong Volitional Swallow: Able to elicit    Oral/Motor/Sensory Function Overall Oral Motor/Sensory Function: Within functional limits   Ice Chips Ice chips: Within functional limits Presentation: Spoon   Thin Liquid Thin Liquid: Within functional limits Presentation: Cup;Straw    Nectar Thick Nectar Thick Liquid: Not tested   Honey Thick Honey Thick Liquid: Not tested   Puree Puree: Within functional limits Presentation: Spoon   Solid     Solid: Impaired Presentation: Self Fed Oral Phase Impairments: Impaired mastication Oral Phase Functional Implications: Prolonged oral transit Pharyngeal Phase Impairments: Cough - Delayed     Thank you,  Genene Churn, [place]  Shilee Biggs 11/08/2019,8:58 AM

## 2019-11-08 NOTE — Progress Notes (Signed)
Pharmacy Antibiotic Note  Cassidy Smith is a 64 y.o. female admitted on 11/06/2019 with unknown source.  Pharmacy has been consulted for Merrem dosing. AF now. RLL PNA, also with UCX: = ESBL, ECOLI, MD changing abx to cover.   Plan: Merrem 1gm IV q8h F/U cxs and clinical progress Monitor V/S, labs, and levels as indicated  Height: 5\' 6"  (167.6 cm) Weight: 54.6 kg (120 lb 5.9 oz) IBW/kg (Calculated) : 59.3  Temp (24hrs), Avg:97.9 F (36.6 C), Min:97.8 F (36.6 C), Max:98 F (36.7 C)  Recent Labs  Lab 11/06/19 1750 11/07/19 0704 11/08/19 0558  WBC 8.4 11.8* 5.9  CREATININE 0.75 0.53 0.47  LATICACIDVEN 1.0  --   --     Estimated Creatinine Clearance: 59.6 mL/min (by C-G formula based on SCr of 0.47 mg/dL).    No Known Allergies  Antimicrobials this admission: Vancomycin 10/23 >> 10/25 Cefepime 10/23 >> 10/25 Merrem 10/25>>  Dose adjustments this admission: prn  Microbiology results: 10/23 BCx: ngtd 10/23 UCx: ESBL e. Coli >100K CFU/ml  10/25 MRSA PCR: NTC  Thank you for allowing pharmacy to be a part of this patient's care.  Isac Sarna, BS Pharm D, [place] Clinical Pharmacist Pager [phone] 11/08/2019 10:35 AM

## 2019-11-11 LAB — CULTURE, BLOOD (ROUTINE X 2)
Culture: NO GROWTH
Culture: NO GROWTH
Special Requests: ADEQUATE
Special Requests: ADEQUATE

## 2019-11-25 ENCOUNTER — Telehealth: Payer: Self-pay

## 2019-11-25 NOTE — Telephone Encounter (Signed)
Patient's son Thedore Mins returned call. Verbal okay given to call patient's brother [name] to schedule Palliative consult appointment. Appointment scheduled for 12/10/19 at [facility]

## 2019-11-25 NOTE — Telephone Encounter (Signed)
Left message for patient's son Cassidy Smith to return call to schedule Palliative consult appointment. Spoke with patient brother Cassidy Smith on [place]) who patient resides with via telephone (home #) in chart. Cassidy Smith states that patient is receiving home health and they are aware of Palliative referral. Will wait for a return call from patient's son Cassidy Smith before scheduling.

## 2019-12-10 ENCOUNTER — Other Ambulatory Visit: Payer: Self-pay

## 2019-12-10 ENCOUNTER — Other Ambulatory Visit: Payer: Medicare Other | Admitting: Nurse Practitioner

## 2019-12-10 DIAGNOSIS — Z515 Encounter for palliative care: Secondary | ICD-10-CM

## 2019-12-10 DIAGNOSIS — R52 Pain, unspecified: Secondary | ICD-10-CM

## 2019-12-10 NOTE — Progress Notes (Signed)
[facility] Consult Note Telephone: [phone]  Fax: [phone]  PATIENT NAME: Cassidy Smith [place] [address] [phone] (home)  DOB: 1955/08/24 MRN: [id number]  PRIMARY CARE PROVIDER:    Neale Burly, MD,  [address] [place] [address] [phone]  REFERRING PROVIDER:   Neale Burly, MD [address] [place],  [place] [address] [phone]  RESPONSIBLE PARTY:   Extended Emergency Contact Information Primary Emergency Contact: Corbyn, Steedman Mobile Phone: [phone] Relation: Son Secondary Emergency Contact: Lorrin Goodell Address: [address]          [place], [place] [address] [place] Phone: [phone] Relation: Brother  I met face to face with patient in home.   ASSESSMENT AND RECOMMENDATIONS:   Advance Care Planning: Today's visit consisted of building trust and discussions on Palliative care medicine as a specialized medical care for people living with serious illness, aimed at facilitating improved quality of life through symptoms relief, assisting with advance care planning and establishing goals of care. Patient, her caregiver Reuben Likes, and her brother [name] present during visit. Patient verbalized concern about Hospice care, she believed palliative care is the same as Hospice care, she asked not to discuss Hospice care or mention the name Hospice, saying she is not ready to die. Attempt at education on difference between Hospice care and palliative care made, will revisit discussion at a later time. Palliative care will continue to provide support to patient, family and the medical team Goal of care: Patient's goal of care is comfort and function. Family does not desire aggressive treatment for any condition, family hope to maintain patient's current function for as long as possible. Directives: Patient's code status is DNR. Patient does not have a  DNR form in home. DNR form was signed and given to her brother [name] to keep in home, copy uploaded to [facility] EMR. Patient has an electronic MOST form on Epic EMR, details of MOST include; limited additional intervention, antibiotics if indicated, IV fluids if indicated, feeding tube for a defined trial period.   Symptom Management:  Pain: Patient report having low back pain, she report the pain sometimes starts in her lower abdomen and radiates to her lower back. Patient however denied pain today, she denied nausea or vomiting. Patient is a poor historian. She is unable to quantify her pain, she described her pain as a pinch. She report taking Aspirin for her pain. Her brother [name] report patient often infer pain if she sees/hears about pain on TV. He denied patient taking Aspirin for pain, saying patient takes Aspirin only once a day for her heart. Patient not on any pain medication. Abdominal exam negative for tenderness, bowel sound present in all quadrant, no tympany, no palpable mass. Patient with history of rectal cancer. Recommendation: [name] unaware of patient's complaints of pain. Patient denied pain today. Family not interested in seeking further evaluation of pain. Recommend patient take over the counter Tylenol 618m by mouth ever 4-6 hours as needed for pain. Advised patient against taking more than her prescribed 820mof Aspirin due to history of GI bleed. Discussed visit update with patient's son ZaEdwyna ReadyPatient's son verbalized unawareness of patient's rectal cancer diagnosis, he however does not want patient to go through diagnostics. He is interested in discussing comfort measures if patient becomes symptomatic.  Follow up Palliative Care Visit: Palliative care will continue to follow for goals of care clarification  and symptom management. Return in about 4 weeks or prn.  Family /Caregiver/Community Supports: Patient lives at home with her brother [name] who is her main care giver.  Patient has one son who lives in [place]. Patient has paid caregivers 8hrs a day, from [place], 4pm - 9pm. Her other siblings come in to assist as able.  Patient care giver today was filling in, she not familiar with patient.  Cognitive/Functional decline: Patient awake and alert, she was intermittenty confused, incoherent at times. Patient is full assist with all ADLs, gait is unsteady, uses a quad cane for ambulation. She is mostly continent of bowel and bladder. No report of recent falls.  I spent 60 minutes providing this consultation, time includes time spent with patient and family, chart review, and documentation. More than 50% of the time in this consultation was spent couseling and coordinating communication.   CHIEF COMPLAINT: Initial Palliative care consult.  HISTORY OF PRESENT ILLNESS:  Cassidy Smith is a 64 y.o. year old female with multiple medical problems including Schizoaffective disorder Bipolar Type, SIADH, chronic anxiety/depression, chronic thrombocytopenia, rectal cancer, chronic DVT, CKD, Neuromuscular disorder. Patient recently hospitalized 11/06/2019-11/05/2019 for sepsis related UTI and pneumonia. 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 Palliative Care was asked to follow this patient by consultation request of Hasanaj, Samul Dada, MD to help address advance care planning and goals of care.   CODE STATUS: DNR  PPS: 40%  HOSPICE ELIGIBILITY/DIAGNOSIS: TBD  PHYSICAL EXAM / ROS:   Current and past weights: as at 11/06/2019 Wt 110lbs, Ht 41f6", BMI 17.8 General: NAD, frail appearing, thin Cardiovascular: denied chest pain, no edema Pulmonary: no cough, no increased SOB, saturation 93% on room air GI: appetite good, no report of constipation/diarrhea GU: denies dysuria MSK: no joint and ROM abnormalities, ambulatory, unsteady gait Skin: no rashes or wounds  reported Neurological: Weakness, confused  PAST MEDICAL HISTORY:  Past Medical History:  Diagnosis Date  . Anxiety   . Bipolar 1 disorder ([facility]   . Chronic hip pain   . Chronic kidney disease    uti  currently,  hx bladder spasms  . Depression   . Headache(784.0)   . Hyperthyroidism   . Ischemic stroke ([facility]   . Neuromuscular disorder (H[facility])    shaking of hands   . SIADH (syndrome of inappropriate ADH production) ([facility] 04/08/2017    SOCIAL HX:  Social History   Tobacco Use  . Smoking status: Former Smoker    Packs/day: 0.50    Years: 20.00    Pack years: 10.00    Types: Cigarettes    Quit date: 05/05/2017    Years since quitting: 2.6  . Smokeless tobacco: Never Used  . Tobacco comment: patient states she quit one week ago  Substance Use Topics  . Alcohol use: No   FAMILY HX:  Family History  Problem Relation Age of Onset  . Breast cancer Mother   . Cancer - Other Mother   . Alcoholism Father   . Colon cancer Neg Hx   . Colon polyps Neg Hx     ALLERGIES: No Known Allergies   PERTINENT MEDICATIONS:  Outpatient Encounter Medications as of 12/10/2019  Medication Sig  . amLODipine (NORVASC) 2.5 MG tablet Take 1 tablet (2.5 mg total) by mouth daily.  .Marland Kitchenaspirin EC 81 MG tablet Take 81 mg by mouth daily. Swallow whole.  .Marland Kitchenatorvastatin (LIPITOR) 20 MG tablet Take  1 tablet (20 mg total) by mouth daily at 6 PM.  . benztropine (COGENTIN) 1 MG tablet Take 1 tablet (1 mg total) by mouth 2 (two) times daily. (Patient not taking: Reported on 11/06/2019)  . Cholecalciferol (VITAMIN D-3) 125 MCG (5000 UT) TABS Take 5,000 Units by mouth daily.  . divalproex (DEPAKOTE ER) 500 MG 24 hr tablet Take 2 tablets (1,000 mg total) by mouth at bedtime. (Patient taking differently: Take 1,500 mg by mouth at bedtime. )  . gabapentin (NEURONTIN) 100 MG capsule Take 1 capsule (100 mg total) by mouth 3 (three) times daily. (Patient taking differently: Take 100-200 mg by mouth See admin  instructions. Take one capsule (100 mg) by mouth every morning and two capsules (200 mg) at night)  . Ipratropium-Albuterol (COMBIVENT RESPIMAT) 20-100 MCG/ACT AERS respimat Inhale 1 puff into the lungs every 6 (six) hours as needed for wheezing or shortness of breath.  . Multiple Vitamin (MULTIVITAMIN WITH MINERALS) TABS tablet Take 1 tablet by mouth daily. Centrum Silver  . pantoprazole (PROTONIX) 40 MG tablet Take 40 mg by mouth daily.  . potassium chloride (K-DUR,KLOR-CON) 10 MEQ tablet Take 1 tablet (10 mEq total) by mouth every other day. (Patient taking differently: Take 10 mEq by mouth daily. )  . risperiDONE (RISPERDAL) 1 MG tablet Take 1 tablet (1 mg total) by mouth at bedtime as needed for up to 15 days (agitation). (Patient not taking: Reported on 07/03/2019)  . risperiDONE (RISPERDAL) 2 MG tablet Take 2 mg by mouth at bedtime.  . traZODone (DESYREL) 100 MG tablet Take 100 mg by mouth at bedtime.   . vitamin B-12 (CYANOCOBALAMIN) 1000 MCG tablet Take 1 tablet (1,000 mcg total) by mouth daily. (Patient not taking: Reported on 11/06/2019)   No facility-administered encounter medications on file as of 12/10/2019.     Jari Favre, DNP, AGPCNP-BC

## 2020-01-05 ENCOUNTER — Other Ambulatory Visit: Payer: Medicare Other | Admitting: Nurse Practitioner

## 2020-01-05 ENCOUNTER — Other Ambulatory Visit: Payer: Self-pay

## 2020-01-05 DIAGNOSIS — Z515 Encounter for palliative care: Secondary | ICD-10-CM

## 2020-01-05 DIAGNOSIS — R52 Pain, unspecified: Secondary | ICD-10-CM

## 2020-01-05 NOTE — Progress Notes (Signed)
[facility] Consult Note Telephone: [phone]  Fax: [phone]  PATIENT NAME: Cassidy Smith [place] [address] [phone] (home)  DOB: [birth year] MRN: [id number]  PRIMARY CARE PROVIDER:    Neale Burly, MD,  [address] [place] [address] [phone]  REFERRING PROVIDER:   Neale Burly, MD [address] [place],  [place] [address] [phone]  RESPONSIBLE PARTY:   Extended Emergency Contact Information Primary Emergency Contact: [place]le, Dickenson Mobile Phone: [phone] Relation: Son Secondary Emergency Contact: Lorrin Goodell Address: [place]          [place], [place] [address] [place] Phone: [phone] Relation: Brother  I met face to face with patient in home. Care giver [name] present during visit.  ASSESSMENT AND RECOMMENDATIONS:   Advance Care Planning: Goal of care: Patient's goal of care is comfort and function. Family does not desire aggressive treatment for any condition. Goal is to maintain patient's current function for as long as possible Directives: Signed DNR form in home. Patient has a Software engineer form on Epic. Details of MOST form include; limited additional intervention, antibiotics if indicated, IV fluids if indicated, feeding tube for a defined trial period.   Symptom Management:   Pain: Patient complained of non radiating neck pain. Caregiver report pain is chronic. No associated symptoms, pain is non-radiating, no report of numbness. FROM noted to neck. Patient brother [name] report patient sleeps with 3 pillow, believed pain is from strain. Patient takes Advil 265m occasionally as needed.  Recommendation: Advised patient to sleep with less pillows to prevent putting strain on neck muscle. Recommend starting patient on scheduled Tylenol 5070mtwice a day, and use Advil in between as needed for break through pain. Falls:  Patient has a throw rug on the wooden floor by her recliner, rug not steady on the floor. Discussed possible increase fall risk with rugs on floor among the elderly. Recommend caregiver and family remove all throw rugs from her room, they verbalized understanding. Pressure ulcer: Patient has a stage 2 pressure ulcer to her sacrum. Wound appeared healing, no drainage, no erythema, no swelling. Patient denied pain at site. Patient sits mostly in her recliner.   Recommendation: Recommend covering area with A & D ointment after peri care. Recommend routine position changes, with patient rotating between chair and bed to promote pressure relief.  Follow up Palliative Care Visit: Palliative care will continue to follow for goals of care clarification and symptom management. Return in about 6 weeks or prn.  Family /Caregiver/Community Supports: Patient lives at home. She lives with her brother who is her main caregiver, she also has paid caregivers 8hrs a day, from [place] from 4pm - 9pm. [place] Her other siblings come in to assist as able.   Cognitive / Functional decline: Patient awake and alert, intermittenty confused, incoherent at times. She is full assist with all ADLs, gait is unsteady, uses a quad cane for ambulation within home, and front wheel walker outside home. Last fall was a week ago, care giver report patient lost her balance, care giver mild bruising medial aspect of left knee, area healed.  I spent 50 minutes providing this consultation, time includes time spent with patient and family, chart review, provider coordination, and documentation. More than 50% of the time in this consultation was spent counseling and coordinating communication.   CHIEF COMPLAINT: Neck  pain  History obtained from review of EMR, discussion with family, caregiver  and patient. Records reviewed and summarized bellow.  HISTORY OF PRESENT ILLNESS:  Cassidy Smith is a 64 y.o.  year old female with multiple medical problems including Schizoaffective disorder Bipolar Type, SIADH, chronic anxiety/depression, chronic thrombocytopenia, rectal cancer, chronic DVT, CKD, Neuromuscular disorder.  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 Family expressed desire for comfort, not interested in further work up or treatment. Palliative care was asked to follow this patient by consultation request of Hasanaj, Samul Dada, MD to help address advance care planning and symptoms management.  CODE STATUS: DNR  PPS: 40%  HOSPICE ELIGIBILITY/DIAGNOSIS: TBD  PHYSICAL EXAM / ROS:   Current and past weights: no current weight reported, BMI 17.8kg/m2 on 12/10/2019 General: frail appearing, thin, siting in recliner in her room in NAD Cardiovascular: denied chest pain, no edema Pulmonary: no cough, no increased SOB, saturation 94% on room air GI: appetite good, no report of constipation or uncontrolled diarrhea, continent of bowel GU: denies dysuria, incontinent of unrine MSK: no joint and ROM abnormalities, ambulatory, unsteady gait Skin: no rashes or wounds reported or noted on exposed skin Neurological: Weakness, mild confusion Psych: non -anxious affect  PAST MEDICAL HISTORY:  Past Medical History:  Diagnosis Date   Anxiety    Bipolar 1 disorder ([facility])    Chronic hip pain    Chronic kidney disease    uti  currently,  hx bladder spasms   Depression    Headache(784.0)    Hyperthyroidism    Ischemic stroke (HCC)    Neuromuscular disorder (HCC)    shaking of hands    SIADH (syndrome of inappropriate ADH production) ([facility]) 04/08/2017    SOCIAL HX:  Social History   Tobacco Use   Smoking status: Former Smoker    Packs/day: 0.50    Years: 20.00    Pack years: 10.00    Types: Cigarettes    Quit date: 05/05/2017    Years since quitting: 2.6   Smokeless tobacco:  Never Used   Tobacco comment: patient states she quit one week ago  Substance Use Topics   Alcohol use: No   FAMILY HX:  Family History  Problem Relation Age of Onset   Breast cancer Mother    Cancer - Other Mother    Alcoholism Father    Colon cancer Neg Hx    Colon polyps Neg Hx     ALLERGIES: No Known Allergies   PERTINENT MEDICATIONS:  Outpatient Encounter Medications as of 01/05/2020  Medication Sig   amLODipine (NORVASC) 2.5 MG tablet Take 1 tablet (2.5 mg total) by mouth daily.   aspirin EC 81 MG tablet Take 81 mg by mouth daily. Swallow whole.   atorvastatin (LIPITOR) 20 MG tablet Take 1 tablet (20 mg total) by mouth daily at 6 PM.   benztropine (COGENTIN) 1 MG tablet Take 1 tablet (1 mg total) by mouth 2 (two) times daily. (Patient not taking: Reported on 11/06/2019)   Cholecalciferol (VITAMIN D-3) 125 MCG (5000 UT) TABS Take 5,000 Units by mouth daily.   divalproex (DEPAKOTE ER) 500 MG 24 hr tablet Take 2 tablets (1,000 mg total) by mouth at bedtime. (Patient taking differently: Take 1,500 mg by mouth at bedtime. )   gabapentin (NEURONTIN) 100 MG capsule Take 1 capsule (100 mg total) by mouth 3 (three) times daily. (Patient taking differently: Take 100-200 mg by mouth See  admin instructions. Take one capsule (100 mg) by mouth every morning and two capsules (200 mg) at night)   Ipratropium-Albuterol (COMBIVENT RESPIMAT) 20-100 MCG/ACT AERS respimat Inhale 1 puff into the lungs every 6 (six) hours as needed for wheezing or shortness of breath.   Multiple Vitamin (MULTIVITAMIN WITH MINERALS) TABS tablet Take 1 tablet by mouth daily. Centrum Silver   pantoprazole (PROTONIX) 40 MG tablet Take 40 mg by mouth daily.   potassium chloride (K-DUR,KLOR-CON) 10 MEQ tablet Take 1 tablet (10 mEq total) by mouth every other day. (Patient taking differently: Take 10 mEq by mouth daily. )   risperiDONE (RISPERDAL) 1 MG tablet Take 1 tablet (1 mg total) by mouth at bedtime as  needed for up to 15 days (agitation). (Patient not taking: Reported on 07/03/2019)   risperiDONE (RISPERDAL) 2 MG tablet Take 2 mg by mouth at bedtime.   traZODone (DESYREL) 100 MG tablet Take 100 mg by mouth at bedtime.    vitamin B-12 (CYANOCOBALAMIN) 1000 MCG tablet Take 1 tablet (1,000 mcg total) by mouth daily. (Patient not taking: Reported on 11/06/2019)   No facility-administered encounter medications on file as of 01/05/2020.     Thank you for the opportunity to participate in the care of Ms. Curly [place]. The palliative care team will continue to follow. Please call our office at [phone] if we can be of additional assistance.  Jari Favre, DNP, AGPCNP-BC

## 2020-01-19 ENCOUNTER — Emergency Department (HOSPITAL_COMMUNITY)
Admission: EM | Admit: 2020-01-19 | Discharge: 2020-01-20 | Disposition: A | Discharge status: Home / Self Care | Payer: Medicare Other | Attending: Emergency Medicine | Admitting: Emergency Medicine

## 2020-01-19 ENCOUNTER — Encounter (HOSPITAL_COMMUNITY): Payer: Self-pay | Admitting: Emergency Medicine

## 2020-01-19 ENCOUNTER — Telehealth: Payer: Self-pay

## 2020-01-19 ENCOUNTER — Emergency Department (HOSPITAL_COMMUNITY): Payer: Medicare Other

## 2020-01-19 ENCOUNTER — Other Ambulatory Visit: Payer: Self-pay

## 2020-01-19 DIAGNOSIS — R410 Disorientation, unspecified: Secondary | ICD-10-CM | POA: Diagnosis present

## 2020-01-19 DIAGNOSIS — Z7982 Long term (current) use of aspirin: Secondary | ICD-10-CM | POA: Insufficient documentation

## 2020-01-19 DIAGNOSIS — N189 Chronic kidney disease, unspecified: Secondary | ICD-10-CM | POA: Insufficient documentation

## 2020-01-19 DIAGNOSIS — E876 Hypokalemia: Secondary | ICD-10-CM | POA: Insufficient documentation

## 2020-01-19 DIAGNOSIS — Z79899 Other long term (current) drug therapy: Secondary | ICD-10-CM | POA: Diagnosis not present

## 2020-01-19 DIAGNOSIS — Z8673 Personal history of transient ischemic attack (TIA), and cerebral infarction without residual deficits: Secondary | ICD-10-CM | POA: Diagnosis not present

## 2020-01-19 DIAGNOSIS — F0390 Unspecified dementia without behavioral disturbance: Secondary | ICD-10-CM | POA: Insufficient documentation

## 2020-01-19 DIAGNOSIS — Z87891 Personal history of nicotine dependence: Secondary | ICD-10-CM | POA: Diagnosis not present

## 2020-01-19 DIAGNOSIS — N390 Urinary tract infection, site not specified: Secondary | ICD-10-CM | POA: Diagnosis not present

## 2020-01-19 LAB — COMPREHENSIVE METABOLIC PANEL
ALT: 47 U/L — ABNORMAL HIGH (ref 0–44)
AST: 163 U/L — ABNORMAL HIGH (ref 15–41)
Albumin: 3 g/dL — ABNORMAL LOW (ref 3.5–5.0)
Alkaline Phosphatase: 66 U/L (ref 38–126)
Anion gap: 8 (ref 5–15)
BUN: 20 mg/dL (ref 8–23)
CO2: 30 mmol/L (ref 22–32)
Calcium: 9.6 mg/dL (ref 8.9–10.3)
Chloride: 99 mmol/L (ref 98–111)
Creatinine, Ser: 0.68 mg/dL (ref 0.44–1.00)
GFR, Estimated: >60 mL/min (ref >60)
Glucose, Bld: 87 mg/dL (ref 70–99)
Potassium: 3.3 mmol/L — ABNORMAL LOW (ref 3.5–5.1)
Sodium: 137 mmol/L (ref 135–145)
Total Bilirubin: 0.4 mg/dL (ref 0.3–1.2)
Total Protein: 6.8 g/dL (ref 6.5–8.1)

## 2020-01-19 LAB — URINALYSIS, ROUTINE W REFLEX MICROSCOPIC
Bilirubin Urine: NEGATIVE
Glucose, UA: NEGATIVE mg/dL
Hgb urine dipstick: NEGATIVE
Ketones, ur: NEGATIVE mg/dL
Nitrite: POSITIVE — AB
Protein, ur: 30 mg/dL — AB
Specific Gravity, Urine: 1.017 (ref 1.005–1.030)
pH: 7 (ref 5.0–8.0)

## 2020-01-19 LAB — CBC
HCT: 36.3 % (ref 36.0–46.0)
Hemoglobin: 11.9 g/dL — ABNORMAL LOW (ref 12.0–15.0)
MCH: 31.5 pg (ref 26.0–34.0)
MCHC: 32.8 g/dL (ref 30.0–36.0)
MCV: 96 fL (ref 80.0–100.0)
Platelets: 81 10*3/uL — ABNORMAL LOW (ref 150–400)
RBC: 3.78 MIL/uL — ABNORMAL LOW (ref 3.87–5.11)
RDW: 13.9 % (ref 11.5–15.5)
WBC: 9.7 10*3/uL (ref 4.0–10.5)
nRBC: 0 % (ref 0.0–0.2)

## 2020-01-19 MED ORDER — POTASSIUM CHLORIDE CRYS ER 20 MEQ PO TBCR: 40.0000 meq | EXTENDED_RELEASE_TABLET | Freq: Once | ORAL | Status: DC

## 2020-01-19 MED ORDER — SODIUM CHLORIDE 0.9 % IV SOLN
1.0000 g | Freq: Once | INTRAVENOUS | Status: AC
  Administered 2020-01-19: 1 g via INTRAVENOUS
  Filled 2020-01-19: qty 10

## 2020-01-19 MED ORDER — POTASSIUM CHLORIDE 20 MEQ PO PACK
20.0000 meq | PACK | Freq: Once | ORAL | Status: AC
  Administered 2020-01-19: 20 meq via ORAL
  Filled 2020-01-19: qty 1

## 2020-01-19 NOTE — ED Notes (Signed)
LATE ENTRY for 2315:  This RN to bedside, patient patient to be sitting on CPR stool with no signs of distress noted. Pt states "I wanted to sit down." Pt was able to get back up into bed. Pt repositioned in bed, cardiac monitor in place. Bed is locked in the lowest position, side rails x2, call bell within reach. Pt provided with warm blankets and door remains open at this time.

## 2020-01-19 NOTE — ED Notes (Signed)

## 2020-01-19 NOTE — ED Triage Notes (Signed)
Per family pt is more confused than normal. Pt has history of dementia. Pt had a fall last night and then again this am.

## 2020-01-19 NOTE — ED Notes (Signed)
Pt in CT at this time.

## 2020-01-19 NOTE — ED Provider Notes (Addendum)
[facility] EMERGENCY DEPARTMENT Provider Note   CSN: [id number] Arrival date & time: 01/19/20  1954     History Chief Complaint  Patient presents with   Altered Mental Status    KATLYNNE GRANNEMAN is a 65 y.o. female.  Patient with hx cva, bipolar, hx uti,  presents with report of being more confused than normal, with recent fall x 2. Patient is awake and alert, and denies any c/o currently. Pt denies headache. No neck or back pain. No chest pain or sob. Denies cough or uri symptoms. No abd pain or nvd. No dysuria. States has normal appetite and is eating and drinking normally. States taking meds per normal. Pt unsure what caused fall, thinks perhaps she tripped. Denies loc or faintness/dizziness.   The history is provided by the patient. The history is limited by the condition of the patient.  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    Patient Active Problem List   Diagnosis Date Noted   Sepsis with acute hypoxic respiratory failure without septic shock ([place])    Dementia without behavioral disturbance ([place])    Palliative care by specialist    Goals of care, counseling/discussion    Pneumonia of right lower lobe due to infectious organism 11/06/2019   Intracerebral hematoma ([place]) 07/04/2019   Closed head injury 07/03/2019   Thrombocytopenia ([place]) 07/03/2019   UTI (urinary tract infection) 01/04/2018   Neuromuscular disorder ([place])    Dysuria 10/30/2017   Fever 08/15/2017   Hypokalemia 08/15/2017    Altered mental state 123456   Acute metabolic encephalopathy 123456   Valproic acid toxicity 08/15/2017   Altered mental status    Ischemic stroke ([place])    GERD (gastroesophageal reflux disease) 05/12/2017   GI bleed    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   Dysplastic rectal polyp    Protein-calorie malnutrition, severe 04/07/2017   Pressure injury of skin 04/04/2017   Anticoagulated 04/03/2017   Stool bloody 04/03/2017   Current every day smoker 04/03/2017   Rectal bleeding    Chronic diarrhea    Hyperthyroidism    S/P ORIF (open reduction internal fixation) fracture right hip IM nail 03/07/17 03/24/2017   Hypomagnesemia 03/07/2017   Fall    Closed intertrochanteric fracture of hip, right, initial encounter ([place]) 03/06/2017   Radial styloid fracture: right 03/06/2017   Anemia 03/06/2017   Orthostatic hypotension 06/07/2015   Lower urinary tract infectious disease 06/07/2015   Rhabdomyolysis 06/07/2015   White matter abnormality on MRI of brain 05/14/2013   History of depression 05/14/2013   Hx of anxiety disorder 05/14/2013   Bipolar I disorder, most recent episode (or current) manic, unspecified 05/14/2013   Bipolar 1 disorder ([place]) 05/14/2013   Hyponatremia 05/12/2013   Bipolar disorder ([place]) 05/12/2013   Lacunar infarct, acute ([place]) 05/12/2013    Past Surgical History:  Procedure Laterality Date   COLONOSCOPY WITH PROPOFOL N/A 04/05/2017   Procedure:  COLONOSCOPY WITH PROPOFOL;  Surgeon: Rogene Houston, MD;  Location: [facility];  Service: Endoscopy;  Laterality: N/A;   INTRAMEDULLARY (IM) NAIL INTERTROCHANTERIC Right 03/07/2017   Procedure: OPEN TREATMENT INTERNAL FIXATION RIGHT HIP WITH GAMA INTRAMEDULARY NAIL;  Surgeon: Carole Civil, MD;  Location: [facility] ORS;  Service: Orthopedics;  Laterality: Right;   MULTIPLE EXTRACTIONS WITH ALVEOLOPLASTY N/A 10/30/2012   Procedure: MULTIPLE EXTRACION  5, 6, 8, 9, 10 ,18, 19, 31 WITH MAXILLARY RIGHT AND LEFT  ALVEOLOPLASTY REDUCE MAXILLARY LEFT TUBEROSITY;  Surgeon: Gae Bon, DDS;  Location: [place];  Service: Oral Surgery;  Laterality: N/A;   POLYPECTOMY  04/05/2017   Procedure: POLYPECTOMY;  Surgeon: Rogene Houston, MD;  Location: [facility];  Service: Endoscopy;;  recto-sigmoid, rectum     OB History   No obstetric history on file.     Family History  Problem Relation Age of Onset   Breast cancer Mother    Cancer - Other Mother    Alcoholism Father    Colon cancer Neg Hx    Colon polyps Neg Hx     Social History   Tobacco Use   Smoking status: Former Smoker    Packs/day: 0.50    Years: 20.00    Pack years: 10.00    Types: Cigarettes    Quit date: 05/05/2017    Years since quitting: 2.7   Smokeless tobacco: Never Used   Tobacco comment: patient states she quit one week ago  Vaping Use   Vaping Use: Never used  Substance Use Topics   Alcohol use: No   Drug use: No    Home Medications Prior to Admission medications   Medication Sig Start Date End Date Taking? Authorizing Provider  amLODipine (NORVASC) 2.5 MG tablet Take 1 tablet (2.5 mg total) by mouth daily. 11/09/19 12/09/19  Manuella Ghazi, Pratik D, DO  aspirin EC 81 MG tablet Take 81 mg by mouth daily. Swallow whole.    [provider]  atorvastatin (LIPITOR) 20 MG tablet Take 1 tablet (20 mg total) by mouth daily at 6 PM. 06/11/17   Barton Dubois, MD  benztropine (COGENTIN) 1 MG tablet Take 1 tablet (1 mg total) by mouth 2 (two) times daily. Patient not taking: Reported on 11/06/2019 01/05/18   Murlean Iba, MD  Cholecalciferol (VITAMIN D-3) 125 MCG (5000 UT) TABS Take 5,000 Units by mouth daily.    [provider]  divalproex (DEPAKOTE ER) 500 MG 24 hr tablet Take 2 tablets (1,000 mg total) by mouth at bedtime. Patient taking differently: Take 1,500 mg by mouth at bedtime.  08/16/17   Orson Eva, MD  gabapentin (NEURONTIN) 100  MG capsule Take 1 capsule (100 mg total) by mouth 3 (three) times daily. Patient taking differently: Take 100-200 mg by mouth See admin instructions. Take one capsule (100 mg) by mouth every morning and two capsules (200 mg) at night 05/13/17   Carole Civil, MD  Ipratropium-Albuterol (COMBIVENT RESPIMAT) 20-100 MCG/ACT AERS respimat Inhale 1 puff into the lungs every 6 (six) hours as needed for wheezing or shortness of breath. 11/08/19   Manuella Ghazi, Pratik D, DO  Multiple Vitamin (MULTIVITAMIN WITH MINERALS) TABS tablet Take 1 tablet by mouth daily. Centrum Silver    [provider]  pantoprazole (PROTONIX) 40 MG tablet Take 40 mg by mouth daily.    [provider]  potassium chloride (K-DUR,KLOR-CON) 10 MEQ tablet Take 1 tablet (10 mEq total) by mouth every other day. Patient taking differently:  Take 10 mEq by mouth daily.  01/08/18   Johnson, Clanford L, MD  risperiDONE (RISPERDAL) 1 MG tablet Take 1 tablet (1 mg total) by mouth at bedtime as needed for up to 15 days (agitation). Patient not taking: Reported on 07/03/2019 01/05/18 01/20/18  Murlean Iba, MD  risperiDONE (RISPERDAL) 2 MG tablet Take 2 mg by mouth at bedtime. 06/21/19   [provider]  traZODone (DESYREL) 100 MG tablet Take 100 mg by mouth at bedtime.     [provider]  vitamin B-12 (CYANOCOBALAMIN) 1000 MCG tablet Take 1 tablet (1,000 mcg total) by mouth daily. Patient not taking: Reported on 11/06/2019 06/11/17   Barton Dubois, MD    Allergies    Patient has no known allergies.  Review of Systems   Review of Systems  Constitutional: Negative for chills and fever.  HENT: Negative for sore throat.   Eyes: Negative for visual disturbance.  Respiratory: Negative for cough and shortness of breath.   Cardiovascular: Negative for chest pain.  Gastrointestinal: Negative for abdominal pain, diarrhea and vomiting.  Genitourinary: Negative for dysuria and flank pain.  Musculoskeletal:  Negative for back pain and neck pain.  Skin: Negative for rash.  Neurological: Negative for headaches.  Hematological: Does not bruise/bleed easily.  Psychiatric/Behavioral: Negative for agitation.    Physical Exam Updated Vital Signs BP (!) 158/73 (BP Location: Right Arm)    Pulse 69    Temp 98.1 F (36.7 C) (Oral)    Resp 20    Wt 54.6 kg    SpO2 97%    BMI 19.43 kg/m   Physical Exam Vitals and nursing note reviewed.  Constitutional:      Appearance: Normal appearance. She is well-developed.  HENT:     Head: Atraumatic.     Nose: Nose normal.     Mouth/Throat:     Mouth: Mucous membranes are moist.  Eyes:     General: No scleral icterus.    Conjunctiva/sclera: Conjunctivae normal.     Pupils: Pupils are equal, round, and reactive to light.  Neck:     Vascular: No carotid bruit.     Trachea: No tracheal deviation.     Comments: No stiffness or rigidity.  Cardiovascular:     Rate and Rhythm: Normal rate and regular rhythm.     Pulses: Normal pulses.     Heart sounds: Normal heart sounds. No murmur heard. No friction rub. No gallop.   Pulmonary:     Effort: Pulmonary effort is normal. No respiratory distress.     Breath sounds: Normal breath sounds.  Chest:     Chest wall: No tenderness.  Abdominal:     General: Bowel sounds are normal. There is no distension.     Palpations: Abdomen is soft.     Tenderness: There is no abdominal tenderness. There is no guarding.  Genitourinary:    Comments: No cva tenderness.  Musculoskeletal:        General: No swelling or tenderness.     Cervical back: Normal range of motion and neck supple. No rigidity. No muscular tenderness.     Comments: CTLS spine, non tender, aligned, no step off. Good rom bil extremities without pain or focal bony tenderness.   Skin:    General: Skin is warm and dry.     Findings: No rash.  Neurological:     Mental Status: She is alert.     Comments: Alert, speech normal. No gross dysarthria or  aphasia. Motor intact  bil, stre 5/5. Sens grossly intact.   Psychiatric:        Mood and Affect: Mood normal.     ED Results / Procedures / Treatments   Labs (all labs ordered are listed, but only abnormal results are displayed) Results for orders placed or performed during the hospital encounter of 01/19/20  CBC  Result Value Ref Range   WBC 9.7 4.0 - 10.5 K/uL   RBC 3.78 (L) 3.87 - 5.11 MIL/uL   Hemoglobin 11.9 (L) 12.0 - 15.0 g/dL   HCT 41.7 40.8 - 14.4 %   MCV 96.0 80.0 - 100.0 fL   MCH 31.5 26.0 - 34.0 pg   MCHC 32.8 30.0 - 36.0 g/dL   RDW 81.8 56.3 - 14.9 %   Platelets 81 (L) 150 - 400 K/uL   nRBC 0.0 0.0 - 0.2 %  Comprehensive metabolic panel  Result Value Ref Range   Sodium 137 135 - 145 mmol/L   Potassium 3.3 (L) 3.5 - 5.1 mmol/L   Chloride 99 98 - 111 mmol/L   CO2 30 22 - 32 mmol/L   Glucose, Bld 87 70 - 99 mg/dL   BUN 20 8 - 23 mg/dL   Creatinine, Ser 7.02 0.44 - 1.00 mg/dL   Calcium 9.6 8.9 - 63.7 mg/dL   Total Protein 6.8 6.5 - 8.1 g/dL   Albumin 3.0 (L) 3.5 - 5.0 g/dL   AST 858 (H) 15 - 41 U/L   ALT 47 (H) 0 - 44 U/L   Alkaline Phosphatase 66 38 - 126 U/L   Total Bilirubin 0.4 0.3 - 1.2 mg/dL   GFR, Estimated >85 >02 mL/min   Anion gap 8 5 - 15  Urinalysis, Routine w reflex microscopic Urine, Catheterized  Result Value Ref Range   Color, Urine YELLOW YELLOW   APPearance HAZY (A) CLEAR   Specific Gravity, Urine 1.017 1.005 - 1.030   pH 7.0 5.0 - 8.0   Glucose, UA NEGATIVE NEGATIVE mg/dL   Hgb urine dipstick NEGATIVE NEGATIVE   Bilirubin Urine NEGATIVE NEGATIVE   Ketones, ur NEGATIVE NEGATIVE mg/dL   Protein, ur 30 (A) NEGATIVE mg/dL   Nitrite POSITIVE (A) NEGATIVE   Leukocytes,Ua SMALL (A) NEGATIVE   RBC / HPF 0-5 0 - 5 RBC/hpf   WBC, UA 21-50 0 - 5 WBC/hpf   Bacteria, UA RARE (A) NONE SEEN   Mucus PRESENT    CT HEAD WO CONTRAST  Result Date: 01/19/2020 CLINICAL DATA:  Mental status change EXAM: CT HEAD WITHOUT CONTRAST TECHNIQUE: Contiguous  axial images were obtained from the base of the skull through the vertex without intravenous contrast. COMPARISON:  07/04/2019 FINDINGS: Brain: No acute territorial infarction, hemorrhage or intracranial mass. Encephalomalacia in the right temporal lobe at the site of prior hemorrhage. Mild atrophy. Moderate ventricular enlargement, slight increased right temporal horn dilatation likely due to ex vacuo dilatation. Moderate white matter hypodensity consistent with chronic small vessel ischemic change. Vascular: No hyperdense vessels.  Carotid vascular calcification Skull: Normal. Negative for fracture or focal lesion. Sinuses/Orbits: Mucosal thickening within the ethmoid sinuses. Other: Incomplete fusion posterior arch of C1 IMPRESSION: 1. No CT evidence for acute intracranial abnormality. 2. Encephalomalacia in the right temporal lobe at the site of prior hemorrhage. 3. Atrophy and chronic small vessel ischemic changes of the white matter. Electronically Signed   By: Jasmine Pang M.D.   On: 01/19/2020 21:25    EKG EKG Interpretation  Date/Time:  Wednesday January 19 2020 20:03:59 EST Ventricular Rate:  74 PR Interval:    QRS Duration: 104 QT Interval:  385 QTC Calculation: 428 R Axis:   0 Text Interpretation: Sinus rhythm Nonspecific T wave abnormality Baseline wander Poor data quality Confirmed by Lajean Saver [phone]) on 01/19/2020 9:33:37 PM   Radiology CT HEAD WO CONTRAST  Result Date: 01/19/2020 CLINICAL DATA:  Mental status change EXAM: CT HEAD WITHOUT CONTRAST TECHNIQUE: Contiguous axial images were obtained from the base of the skull through the vertex without intravenous contrast. COMPARISON:  07/04/2019 FINDINGS: Brain: No acute territorial infarction, hemorrhage or intracranial mass. Encephalomalacia in the right temporal lobe at the site of prior hemorrhage. Mild atrophy. Moderate ventricular enlargement, slight increased right temporal horn dilatation likely due to ex vacuo dilatation.  Moderate white matter hypodensity consistent with chronic small vessel ischemic change. Vascular: No hyperdense vessels.  Carotid vascular calcification Skull: Normal. Negative for fracture or focal lesion. Sinuses/Orbits: Mucosal thickening within the ethmoid sinuses. Other: Incomplete fusion posterior arch of C1 IMPRESSION: 1. No CT evidence for acute intracranial abnormality. 2. Encephalomalacia in the right temporal lobe at the site of prior hemorrhage. 3. Atrophy and chronic small vessel ischemic changes of the white matter. Electronically Signed   By: Donavan Foil M.D.   On: 01/19/2020 21:25    Procedures Procedures (including critical care time)  Medications Ordered in ED Medications - No data to display  ED Course  I have reviewed the triage vital signs and the nursing notes.  Pertinent labs & imaging results that were available during my care of the patient were reviewed by me and considered in my medical decision making (see chart for details).    MDM Rules/Calculators/A&P                          Continuous pulse ox and cardiac monitoring. Stat labs. Imaging ordered.   Reviewed nursing notes and prior charts for additional history.   Labs reviewed/interpreted by me - wbc normal. k sl low. Kcl po. Po fluids.   CT reviewed/interpreted by me - no hem.   Additional labs reviewed/interpreted by me - ua c/w uti. Urine culture sent. Rocephin iv.   On review last positive urine culture, multiple resistances noted. Zosyn iv. rx macrobid.    Pt is tolerating po. Is awake, alert, oriented. No c/o abd or flank pain, no nv. Overall pt is stable/not toxic appearing.   Initially not noted that pt is on depakote - on review chart, hx elevated depakote levels - ?whether could possible relate to recent falls.  depakote level added to labs.   0015, signed out to Dr Betsey Holiday that meds are being given, and to check Depakote level - anticipate probable d/c to home then.          Final  Clinical Impression(s) / ED Diagnoses Final diagnoses:  None    Rx / DC Orders ED Discharge Orders    None           Lajean Saver, MD 01/20/20 [phone]

## 2020-01-19 NOTE — Telephone Encounter (Signed)
Received phone call from [facility] Case manager with [employer] program out of [facility]. Pam shared that she received an update from CNA that when she arrived at the home, she found patient on the floor. CNA also noted that she observed patient out in the snow the day prior. Pam spoke with patient's brother, Casimiro Needle, who requested a visit from Palliative care NP.

## 2020-01-20 DIAGNOSIS — N390 Urinary tract infection, site not specified: Secondary | ICD-10-CM | POA: Diagnosis not present

## 2020-01-20 LAB — VALPROIC ACID LEVEL: Valproic Acid Lvl: 90 ug/mL (ref 50.0–100.0)

## 2020-01-20 MED ORDER — NITROFURANTOIN MONOHYD MACRO 100 MG PO CAPS: 100.0000 mg | ORAL_CAPSULE | Freq: Two times a day (BID) | ORAL | 0 refills | Status: DC

## 2020-01-20 MED ORDER — NITROFURANTOIN MONOHYD MACRO 100 MG PO CAPS
100.0000 mg | ORAL_CAPSULE | Freq: Once | ORAL | Status: AC
  Administered 2020-01-20: 100 mg via ORAL
  Filled 2020-01-20: qty 1

## 2020-01-20 NOTE — Discharge Instructions (Addendum)
It was our pleasure to provide your ER care today - we hope that you feel better.  The lab tests show a urine infection. Take antibiotic as prescribed. A urine culture was sent the results of which will be back in two days time - have your doctor follow up on that result then.  The lab tests also show a mild low potassium level - eat plenty of fruits and vegetables, and follow up with your doctor.  Drink plenty of fluids. Get adequate nutrition.  Fall precautions.   Follow up with your doctor in the coming week.   Return to ER if worse, new symptoms, fevers, new or severe pain, persistent vomiting, trouble breathing, or other concern.

## 2020-01-22 LAB — URINE CULTURE: Culture: >=100000 — AB

## 2020-02-11 ENCOUNTER — Other Ambulatory Visit: Payer: Self-pay

## 2020-02-11 ENCOUNTER — Other Ambulatory Visit: Payer: Medicare Other | Admitting: Nurse Practitioner

## 2020-02-11 DIAGNOSIS — G8929 Other chronic pain: Secondary | ICD-10-CM

## 2020-02-11 DIAGNOSIS — Z515 Encounter for palliative care: Secondary | ICD-10-CM

## 2020-02-11 NOTE — Progress Notes (Addendum)
Cassidy Smith: [phone]  Fax: [phone]  PATIENT NAME: Cassidy Smith [place] [address] [phone] (home)  DOB: [birth year] MRN: [id number]  PRIMARY CARE PROVIDER:    Neale Burly, MD,  [address] [place] [address] [phone]  REFERRING PROVIDER:   Neale Burly, MD [address] [place],  [place] [address] [phone]  RESPONSIBLE PARTY:   Extended Emergency Contact Information Primary Emergency Contact: Cassidy, Smith Mobile Phone: [phone] Relation: Son Secondary Emergency Contact: Cassidy Smith Address: [address]          [place], [place] [address] [place] Phone: [phone] Relation: Brother  I met face to face with patient and family in home. Caregiver Cassidy Smith in home at the time of visit.  ASSESSMENT AND RECOMMENDATIONS:   Advance Care Planning: Reviewed patient's goal of care with her brother Cassidy Smith. Patient's goal of care is comfort. Family does not desire aggressive treatment for any condition. Family also not desiring hospital admission saying it is becoming difficult to transport patient. Signed DNR form in home. Patient has a Software engineer form on Epic. Details of MOST form include; limited additional intervention, antibiotics if indicated, IV fluids if indicated, feeding tube for a defined trial period.  Symptom Management:  Chronic lower back pain: symptom worsened in the last week, patient unable to quantify pain. No rash or lesion noted on skin. No report of recent trauma, no CVA tenderness, pt denies dysuria.  Plan: Start Tylenol 573m and Ibuprofen 2066mBID, take both by mouth BID to TID for pain X 2 weeks then as needed for moderate to severe pain. Family made aware that worsening pain could be a sign of progression of her cancer. Home hospice services and philosophy were discussed and explained in detail to  her brother Cassidy Smith patient has a cancer that is not been treated. Cassidy Beltsxpressed interest in home hospice but want to discuss with other family members and would let me know their discussion. Cough: Congested and non productive cough. started 5 days ago. No report of fever or chills, no known exposure to COVID-19. Plan: Start Mucinex 60043mtake one tablet twice a day for 7 days. Prescription sent patient's pharmacy. Patient advised not to chew or crush medication. Family advised to take patient to ED for evaluation if worsening cough, fever, or SOB. Family verbalized understanding.  Follow up Palliative Care Visit: Palliative care will continue to follow for goals of care clarification and symptom management. Return in about 4 weeks or prn.  Family /Caregiver/Community Supports: Patient lives at home with her Cassidy Smith has paid caregivers 8hrs a day, from 8AMMissiond from 4pm - 9pm. Patient has one son who [place] in [place] Mik[place]e [place] her main caregiver. Her other siblingscome in to assist as able.   Cognitive / Functional decline: Patient awake and alert today, intermittenty confused and incoherent. She isfull assist withallADLs,gait is unsteady, uses a quadcane for ambulation within home, and front wheel walker outside home. Patient with history of frequent falls.  I spent 60 minutes providing this consultation, time includes time spent with patient and family, chart review, provider coordination, and documentation. More than 50% of the time in this consultation was spent counseling and coordinating communication.   CHIEF COMPLAINT: Cough and lower back pain  History obtained from review of EMR and interview with family and caregiver. Records reviewed and summarized  bellow.  HISTORY OF PRESENT ILLNESS:Cassidy L Rockwellis a 65 y.o.year old femalewith multiple medical problems including Schizoaffective disorder Bipolar Type, SIADH, chronic anxiety/depression, chronic  thrombocytopenia, rectal cancer, chronic DVT, CKD, Neuromuscular disorder.  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 Family expressed desire for comfort, not interested in further work up or treatment. Patient had ED visit on 01/19/2020 was treated for UTI and discharged on Nitrofurantoin, completed abx 2 weeks ago. Palliativecare was asked to help address advance care planning, symptoms management and complex decision making.  CODE STATUS: DNR  PPS: 40%  HOSPICE ELIGIBILITY/DIAGNOSIS: TBD  PHYSICAL EXAM / ROS:   BP 132/70, P 70, RR 18, 95% on room air  Weight on 01/19/2020: 120lbs, BMI 19.43kg/m2 General: frail appearing, thin, siting in recliner in her room in NAD Cardiovascular:deniedchest pain, no edema Pulmonary: acute cough, no SOB, room air GI: appetitepoor,no report ofconstipation or uncontrolled diarrhea, continent of bowel GU: denies dysuria, incontinent of unrine MSK:  ambulatory, unsteady gait Neurological: Weakness,mild confusion Psych: non -anxious affect  PAST MEDICAL HISTORY:  Past Medical History:  Diagnosis Date  . Anxiety   . Bipolar 1 disorder ([facility])   . Chronic hip pain   . Chronic kidney disease    uti  currently,  hx bladder spasms  . Depression   . Headache(784.0)   . Hyperthyroidism   . Ischemic stroke ([facility])   . Neuromuscular disorder (HCC)    shaking of hands   . SIADH (syndrome of inappropriate ADH production) ([facility]) 04/08/2017    SOCIAL HX:  Social History   Tobacco Use  . Smoking status: Former Smoker    Packs/day: 0.50    Years: 20.00    Pack years: 10.00    Types: Cigarettes    Quit date: 05/05/2017    Years since quitting: 2.7  . Smokeless tobacco: Never Used  . Tobacco comment: patient states she quit one week ago  Substance Use Topics  . Alcohol use: No   FAMILY HX:  Family History  Problem Relation Age of Onset  .  Breast cancer Mother   . Cancer - Other Mother   . Alcoholism Father   . Colon cancer Neg Hx   . Colon polyps Neg Hx     ALLERGIES: No Known Allergies   PERTINENT MEDICATIONS:  Outpatient Encounter Medications as of 02/11/2020  Medication Sig  . amLODipine (NORVASC) 2.5 MG tablet Take 1 tablet (2.5 mg total) by mouth daily.  Marland Kitchen aspirin EC 81 MG tablet Take 81 mg by mouth daily. Swallow whole.  Marland Kitchen atorvastatin (LIPITOR) 20 MG tablet Take 1 tablet (20 mg total) by mouth daily at 6 PM.  . benztropine (COGENTIN) 1 MG tablet Take 1 tablet (1 mg total) by mouth 2 (two) times daily. (Patient not taking: Reported on 11/06/2019)  . Cholecalciferol (VITAMIN D-3) 125 MCG (5000 UT) TABS Take 5,000 Units by mouth daily.  . divalproex (DEPAKOTE ER) 500 MG 24 hr tablet Take 2 tablets (1,000 mg total) by mouth at bedtime. (Patient taking differently: Take 1,500 mg by mouth at bedtime. )  . gabapentin (NEURONTIN) 100 MG capsule Take 1 capsule (100 mg total) by mouth 3 (three) times daily. (Patient taking differently: Take 100-200 mg by mouth See admin instructions. Take one capsule (100 mg) by mouth every morning and two capsules (200 mg) at night)  . Ipratropium-Albuterol (COMBIVENT RESPIMAT) 20-100 MCG/ACT AERS respimat Inhale 1 puff into the lungs every 6 (  six) hours as needed for wheezing or shortness of breath.  . Multiple Vitamin (MULTIVITAMIN WITH MINERALS) TABS tablet Take 1 tablet by mouth daily. Centrum Silver  . nitrofurantoin, macrocrystal-monohydrate, (MACROBID) 100 MG capsule Take 1 capsule (100 mg total) by mouth 2 (two) times daily.  . pantoprazole (PROTONIX) 40 MG tablet Take 40 mg by mouth daily.  . potassium chloride (K-DUR,KLOR-CON) 10 MEQ tablet Take 1 tablet (10 mEq total) by mouth every other day. (Patient taking differently: Take 10 mEq by mouth daily. )  . risperiDONE (RISPERDAL) 1 MG tablet Take 1 tablet (1 mg total) by mouth at bedtime as needed for up to 15 days (agitation). (Patient  not taking: Reported on 07/03/2019)  . risperiDONE (RISPERDAL) 2 MG tablet Take 2 mg by mouth at bedtime.  . traZODone (DESYREL) 100 MG tablet Take 100 mg by mouth at bedtime.   . vitamin B-12 (CYANOCOBALAMIN) 1000 MCG tablet Take 1 tablet (1,000 mcg total) by mouth daily. (Patient not taking: Reported on 11/06/2019)   No facility-administered encounter medications on file as of 02/11/2020.    Thank you for the opportunity to participate in the care of Ms. Curly Shores. The palliative care team will continue to follow. Please call our office at [phone] if we can be of additional assistance.  Jari Favre, DNP, AGPCNP-BC

## 2020-03-17 ENCOUNTER — Other Ambulatory Visit: Payer: Medicare Other | Admitting: Nurse Practitioner

## 2020-03-17 ENCOUNTER — Other Ambulatory Visit: Payer: Self-pay

## 2020-03-17 DIAGNOSIS — Z515 Encounter for palliative care: Secondary | ICD-10-CM

## 2020-03-17 DIAGNOSIS — R630 Anorexia: Secondary | ICD-10-CM

## 2020-03-17 NOTE — Progress Notes (Signed)
[facility] Consult Note Telephone: [phone]  Fax: [phone]  PATIENT NAME: Cassidy Smith [place] [address] [phone] (home)  DOB: 08-09-1955 MRN: [id number]  PRIMARY CARE PROVIDER:    Neale Burly, MD,  [address] [place] [address] [phone]  REFERRING PROVIDER:   Neale Burly, MD [address] [place],  [place] [address] [phone]  RESPONSIBLE PARTY:   Extended Emergency Contact Information Primary Emergency Contact: Kenyanna, [place] Mobile Phone: [phone] Relation: Son Secondary Emergency Contact: Lorrin Goodell Address: [address]          [place], [place] [address] [place] Phone: [phone] Relation: Brother  I met face to face with patient in home.  Caregiver [name].  ASSESSMENT AND RECOMMENDATIONS:   Advance Care Planning: Goal of care: Goal of care is comfort, no aggressive treatment desired.  Directives: Signed DNR in home. Electronic MOST form on [facility] Epic EMR.  Cognitive / Functional decline/Symptom Management:  Patient awake, alert and coherent. Sheisfull assist withallADLs,gait is unsteady, uses a quadcane for ambulationwithin home, and front wheel walker outside home. Patient with history of frequent falls related to gait instability. Patient report feeling well today. Cough from last visit has resolved. She denied any pain today,  caregiver report much pain improvement with regimen of Tylenol 541m and Ibuprofen 2037mtwice a day, advised to continue regimen.  Patient continues to have poor appetite, caregiver report patient consuming nutritional supplement supplied by her CAP worker. Patient report not liking the [employer] Energynymore, report preferring chocolate flavor. Caregiver advised to notify her case worker of patient's preference. Recommend weighing patient weekly to keep track of weights. No report of  insomnia. Patient taking her medications without problems. Provided general support and encouragement, no other unmet needs identified at this time.  Follow up Palliative Care Visit: Palliative care will continue to follow for complex decision making and symptom management. Return in about 6 weeks or prn.  Family /Caregiver/Community Supports: Patient lives at home with her BrTanja PortPatient has one son who lives [place] brother MiRonalee Beltss her main caregiver. Shehas paid caregivers 8hrs a day, from [place] her main day time caregiver, knowledgeable about patient.Receives CAT services.  I spent 25 minutes providing this consultation, time includes time spent with patient, chart review, provider coordination, and documentation. More than 50% of the time in this consultation was spent counseling and coordinating communication.   CHIEF COMPLAINT: Poor appetite  History obtained from review of EMR, and discussion with patient and her paid caregiver. Records reviewed and summarized bellow.  HISTORY OF PRESENT ILLNESS:Cassidy L Rockwellis a [age].o.year old femalewith multiple medical problems including Schizoaffective disorder Bipolar Type, SIADH, chronic anxiety/depression, chronic thrombocytopenia, rectal cancer, chronic DVT, CKD, Neuromuscular disorder. 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 Family expressed desire for comfort, not interested in further work up or treatment. Palliativecare was asked to help address advance care planning, symptoms management and complex decision making.  CODE STATUS: DNR  PPS: 40%  HOSPICE ELIGIBILITY/DIAGNOSIS: TBD  PHYSICAL EXAM / ROS:   BP 150/80, P 72, RR 16, 92% on room air  Weight:  BMI 19.43kg/m2 General: frail appearing, thin, siting in recliner in her room in NAD Cardiovascular:deniedchest  pain, denied palpitation, no edema Pulmonary: no cough, no SOB, room air GI: appetitepoor,no report  ofconstipationor uncontrolleddiarrhea, continent of bowel GU: denies dysuria, incontinent of unrine MSK:  ambulatory, unsteady gait Neurological: Weakness,otherwise non-focal Psych: non -anxious affect    PAST MEDICAL HISTORY:  Past Medical History:  Diagnosis Date  . Anxiety   . Bipolar 1 disorder ([facility])   . Chronic hip pain   . Chronic kidney disease    uti  currently,  hx bladder spasms  . Depression   . Headache(784.0)   . Hyperthyroidism   . Ischemic stroke ([facility])   . Neuromuscular disorder (HCC)    shaking of hands   . SIADH (syndrome of inappropriate ADH production) ([facility]) 04/08/2017    SOCIAL HX:  Social History   Tobacco Use  . Smoking status: Former Smoker    Packs/day: 0.50    Years: 20.00    Pack years: 10.00    Types: Cigarettes    Quit date: 05/05/2017    Years since quitting: 2.8  . Smokeless tobacco: Never Used  . Tobacco comment: patient states she quit one week ago  Substance Use Topics  . Alcohol use: No   FAMILY HX:  Family History  Problem Relation Age of Onset  . Breast cancer Mother   . Cancer - Other Mother   . Alcoholism Father   . Colon cancer Neg Hx   . Colon polyps Neg Hx     ALLERGIES: No Known Allergies   PERTINENT MEDICATIONS:  Outpatient Encounter Medications as of 03/17/2020  Medication Sig  . amLODipine (NORVASC) 2.5 MG tablet Take 1 tablet (2.5 mg total) by mouth daily.  Marland Kitchen aspirin EC 81 MG tablet Take 81 mg by mouth daily. Swallow whole.  Marland Kitchen atorvastatin (LIPITOR) 20 MG tablet Take 1 tablet (20 mg total) by mouth daily at 6 PM.  . benztropine (COGENTIN) 1 MG tablet Take 1 tablet (1 mg total) by mouth 2 (two) times daily. (Patient not taking: Reported on 11/06/2019)  . Cholecalciferol (VITAMIN D-3) 125 MCG (5000 UT) TABS Take 5,000 Units by mouth daily.  . divalproex (DEPAKOTE ER) 500 MG 24 hr tablet Take 2 tablets (1,000 mg  total) by mouth at bedtime. (Patient taking differently: Take 1,500 mg by mouth at bedtime. )  . gabapentin (NEURONTIN) 100 MG capsule Take 1 capsule (100 mg total) by mouth 3 (three) times daily. (Patient taking differently: Take 100-200 mg by mouth See admin instructions. Take one capsule (100 mg) by mouth every morning and two capsules (200 mg) at night)  . Ipratropium-Albuterol (COMBIVENT RESPIMAT) 20-100 MCG/ACT AERS respimat Inhale 1 puff into the lungs every 6 (six) hours as needed for wheezing or shortness of breath.  . Multiple Vitamin (MULTIVITAMIN WITH MINERALS) TABS tablet Take 1 tablet by mouth daily. Centrum Silver  . nitrofurantoin, macrocrystal-monohydrate, (MACROBID) 100 MG capsule Take 1 capsule (100 mg total) by mouth 2 (two) times daily.  . pantoprazole (PROTONIX) 40 MG tablet Take 40 mg by mouth daily.  . potassium chloride (K-DUR,KLOR-CON) 10 MEQ tablet Take 1 tablet (10 mEq total) by mouth every other day. (Patient taking differently: Take 10 mEq by mouth daily. )  . risperiDONE (RISPERDAL) 1 MG tablet Take 1 tablet (1 mg total) by mouth at bedtime as needed for up to 15 days (agitation). (Patient not taking: Reported on 07/03/2019)  . risperiDONE (RISPERDAL) 2 MG tablet Take 2 mg by mouth at bedtime.  . traZODone (DESYREL) 100 MG tablet Take 100 mg by mouth at bedtime.   . vitamin B-12 (CYANOCOBALAMIN) 1000 MCG tablet Take 1 tablet (1,000 mcg  total) by mouth daily. (Patient not taking: Reported on 11/06/2019)   No facility-administered encounter medications on file as of 03/17/2020.    Thank you for the opportunity to participate in the care of Ms.Curly Shores. The palliative care team will continue to follow. Please call our office at [phone] if we can be of additional assistance.   Jari Favre , DNP, AGPCNP-BC

## 2020-05-02 ENCOUNTER — Other Ambulatory Visit: Payer: Medicare Other | Admitting: Nurse Practitioner

## 2020-05-02 ENCOUNTER — Other Ambulatory Visit: Payer: Self-pay

## 2020-05-02 DIAGNOSIS — R630 Anorexia: Secondary | ICD-10-CM

## 2020-05-02 DIAGNOSIS — Z515 Encounter for palliative care: Secondary | ICD-10-CM

## 2020-05-02 NOTE — Progress Notes (Addendum)
[facility] Consult Note Telephone: [phone]  Fax: [phone]    Date of encounter: 05/02/20 PATIENT NAME: Cassidy Smith [place] [address]   [phone] (home)  DOB: 19-Oct-1955 MRN: [id number]   PRIMARY CARE PROVIDER:    Neale Burly, MD,  [place] [address] [phone]  REFERRING PROVIDER:   Neale Burly, MD [address] [place],  [place] [address] [phone]  RESPONSIBLE PARTY:    Contact Information     Name Relation Home Work [place], Thedore Mins Son   [phone]   Willette Alma [place], [place]   [place]   [phone]       I met face to face with patient and family in home.  Palliative Care was asked to follow this patient by consultation request of  Hasanaj, Samul Dada, MD to address advance care planning and complex medical decision making. This is a follow up visit.                                   ASSESSMENT AND PLAN / RECOMMENDATIONS:   Advance Care Planning/Goals of Care:  Code Status: DNR Goal of Care: Goal of care is comfort. Directives: Signed DNR in home. Electronic MOST form on [facility] Epic EMR. Details of MOST include limited additional intervention, antibiotics if indicated, IV fluids if indicated, feeding tube for a defined trial period  Symptom Management/Plan: Neck pain: Neck pain likely related to positioning as family report patient sleeps with 3-4 pillows. Consider using one pillow to sleep at night, advised family to remove exra pillows from patient's room.  Poor oral intake:  Family report patient with concentrated urine, likely related to poor hydration. Encourage patient to drink more water, limit cafinated drinks if possible. May consider offering patient bottled water to promote more water intake. Monitor for worsening symptoms, monitor for fever, abdominal pain,  or malodorous urine and notify her PCP promptly. Patient's brother verbalized that she thought patient was on Hospice care, he was educated that patient is receiving Palliative care at this time and not on Hospice care. He was however made aware that patient could be evaluated for Hospice appropriateness. He was advised that patient continue follow up with her providers until she is transitioned to Hospice if eligible.   Follow up Palliative Care Visit: Palliative care will continue to follow for complex medical decision making, advance care planning, and clarification of goals. Return in about 4 weeks or prn.  This visit was coded based on medical decision making (MDM).  PPS: 40% weak  HOSPICE ELIGIBILITY/DIAGNOSIS: TBD, patient deemed not eligible for hospice care at this time, as she does not meet eligibility criteria.  CHIEF COMPLAIN: neck pain  HISTORY OF PRESENT ILLNESS:  Cassidy Smith is a 65 y.o. year old female with report of posterior neck pain, which is ongoing but worsened in the last 2 weeks, pain is moderate, worse in th mornings, relieved with PRN Tylenol. Family report patient uses 3-4 pillows to sleep at night. No report of headache, no report of SOB, no report fever or chills. Family and caregiver report patient with ongoing poor oral intake, report patient drinks mostly sodas and little water. Care giver report patient urine to be dark orange, no report of malodorous urine  or abdominal pain. Her medical problems including Schizoaffective disorder, Bipolar Type, SIADH, chronic anxiety/depression, chronic thrombocytopenia, rectal cancer, chronic DVT, CKD, Neuromuscular disorder. 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 Family expressed desire for comfort, not interested in further work up or treatment.  History obtained from review of EMR and discussion with patient, her  brother [name] and caregiver [name]. I reviewed available labs, medications, imaging, studies and related documents from the EMR. Records reviewed and summarized here.   Current Outpatient Medications on File Prior to Visit  Medication Sig Dispense Refill   amLODipine (NORVASC) 2.5 MG tablet Take 1 tablet (2.5 mg total) by mouth daily. 30 tablet 0   aspirin EC 81 MG tablet Take 81 mg by mouth daily. Swallow whole.     atorvastatin (LIPITOR) 20 MG tablet Take 1 tablet (20 mg total) by mouth daily at 6 PM. 30 tablet 1   benztropine (COGENTIN) 1 MG tablet Take 1 tablet (1 mg total) by mouth 2 (two) times daily. (Patient not taking: Reported on 11/06/2019)     Cholecalciferol (VITAMIN D-3) 125 MCG (5000 UT) TABS Take 5,000 Units by mouth daily.     divalproex (DEPAKOTE ER) 500 MG 24 hr tablet Take 2 tablets (1,000 mg total) by mouth at bedtime. (Patient taking differently: Take 1,500 mg by mouth at bedtime. ) 60 tablet 1   gabapentin (NEURONTIN) 100 MG capsule Take 1 capsule (100 mg total) by mouth 3 (three) times daily. (Patient taking differently: Take 100-200 mg by mouth See admin instructions. Take one capsule (100 mg) by mouth every morning and two capsules (200 mg) at night) 90 capsule 2   Ipratropium-Albuterol (COMBIVENT RESPIMAT) 20-100 MCG/ACT AERS respimat Inhale 1 puff into the lungs every 6 (six) hours as needed for wheezing or shortness of breath. 4 g 0   Multiple Vitamin (MULTIVITAMIN WITH MINERALS) TABS tablet Take 1 tablet by mouth daily. Centrum Silver     nitrofurantoin, macrocrystal-monohydrate, (MACROBID) 100 MG capsule Take 1 capsule (100 mg total) by mouth 2 (two) times daily. 14 capsule 0   pantoprazole (PROTONIX) 40 MG tablet Take 40 mg by mouth daily.     potassium chloride (K-DUR,KLOR-CON) 10 MEQ tablet Take 1 tablet (10 mEq total) by mouth every other day. (Patient taking differently: Take 10 mEq by mouth daily. )     risperiDONE (RISPERDAL) 1 MG tablet Take 1 tablet (1 mg  total) by mouth at bedtime as needed for up to 15 days (agitation). (Patient not taking: Reported on 07/03/2019) 15 tablet 0   risperiDONE (RISPERDAL) 2 MG tablet Take 2 mg by mouth at bedtime.     traZODone (DESYREL) 100 MG tablet Take 100 mg by mouth at bedtime.      vitamin B-12 (CYANOCOBALAMIN) 1000 MCG tablet Take 1 tablet (1,000 mcg total) by mouth daily. (Patient not taking: Reported on 11/06/2019) 30 tablet 1   No current facility-administered medications on file prior to visit.   Past Medical History:  Diagnosis Date   Anxiety    Bipolar 1 disorder ([facility])    Chronic hip pain    Chronic kidney disease    uti  currently,  hx bladder spasms   Depression    Headache(784.0)    Hyperthyroidism    Ischemic stroke (HCC)    Neuromuscular disorder (HCC)    shaking of hands    SIADH (syndrome of inappropriate ADH production) ([facility]) 04/08/2017   Physical Exam: Constitutional:  NAD General: frail appearing, thin EYES: anicteric sclera,  no discharge  ENMT: intact hearing, oral mucous membranes moist CV: RRR, no LE edema Pulmonary: LCTA, no increased work of breathing, no cough, room air Abdomen: intake <25%, , no ascites GU: deferred MSK: mild sarcopenia, moves all extremities, ambulatory Skin: warm and dry, no rashes or wounds on visible skin Neuro:  generalized weakness,  cognitive impairment Psych: non-anxious affect, A and O x 2 Hem/lymph/immuno: no widespread bruising  Thank you for the opportunity to participate in the care of Ms. Ayala.  The palliative care team will continue to follow. Please call our office at [phone] if we can be of additional assistance.   Jari Favre, DNP, AGPCNP-BC   COVID-19 PATIENT SCREENING TOOL Asked and negative response unless otherwise noted:   Have you had symptoms of covid, tested positive or been in contact with someone with symptoms/positive test in the past 5-10 days?

## 2020-07-11 ENCOUNTER — Other Ambulatory Visit: Payer: Self-pay

## 2020-07-11 ENCOUNTER — Other Ambulatory Visit: Payer: Medicare Other | Admitting: Nurse Practitioner

## 2020-07-11 VITALS — BP 118/56 | HR 71 | Resp 16

## 2020-07-11 DIAGNOSIS — R52 Pain, unspecified: Secondary | ICD-10-CM

## 2020-07-11 DIAGNOSIS — R531 Weakness: Secondary | ICD-10-CM

## 2020-07-11 DIAGNOSIS — F250 Schizoaffective disorder, bipolar type: Secondary | ICD-10-CM

## 2020-07-11 DIAGNOSIS — Z515 Encounter for palliative care: Secondary | ICD-10-CM

## 2020-07-11 NOTE — Progress Notes (Signed)
[facility] Consult Note Telephone: [phone]  Fax: [phone]    Date of encounter: 07/11/20 PATIENT NAME: Cassidy Smith [place] [id number]   [phone] (home)  DOB: January 01, 1956 MRN: [id number]  PRIMARY CARE PROVIDER:    Neale Burly, MD,  [place] [address] [phone]  REFERRING PROVIDER:   Neale Burly, MD [address] [place],  [place] [address] [phone]  RESPONSIBLE PARTY:    Contact Information     Name Relation Home Work [place], Thedore Mins Son   [phone]   Willette Alma [place], Sheldon   [place] Mo[place]   [phone]     I met face to face with patient in home. Patient's brother and her caregiver [name] during visit.  Palliative Care was asked to follow this patient by consultation request of  Hasanaj, Samul Dada, MD to address advance care planning and complex medical decision making. This is a follow up visit.                                  ASSESSMENT AND PLAN / RECOMMENDATIONS:   Advance Care Planning/Goals of Care: Goals include to maximize quality of life and symptom management. ACP discussion held with patient and also with her brother [name]. Our advance care planning conversation included a discussion about:    The value and importance of advance care planning  Exploration of personal, cultural or spiritual beliefs that might influence medical decisions  Exploration of goals of care in the event of a sudden injury or illness  Review and updating or creation of an  advance directive document . CODE STATUS: DNR Goal of care: Patient's goal of care is comfort while preserving function. Directives: Patient and her family reiterated decision for no resuscitation effort in the event of cardiac or respiratory arrest. Patient has a signed MOST form on Epic EMR. No change was made to MOST form  election on review today. Details of MOST include limited additional intervention, antibiotics if indicated, IV fluids if indicated, feeding tube for a defined trial period. Patient's family unable to located DNR form in home, new DNR form signed for patient today. Provided general support and encouragement.  Palliative care will continue to provide support to patient, family and medical team.  I spent 20 minutes providing this consultation. More than 50% of the time in this consultation was spent in counseling and care coordination. -----------------------------------------------------------------------------------  Symptom Management/Plan: Generalized weakness: Continue supportive care. Maintain safety. Discussed fall prevention strategies to include consistent use of assistive deviece during ambulation. May consider PT/OT if patient able to participate. Schizoaffective disorder: No report of uncontrolled behaviorial concerns. Patient followed by Dr. Alden Server with [facility] center. Continue current plan of care. With Risperdal, Cogentin, and Risperdal. Pain: Patient denied pain. Continue current plan of care with Tylenol as needed. Continue Gabapentin as scheduled.  Questions and concerns were addressed. The patient/family was encouraged to call with questions and/or concerns  Follow up Palliative Care Visit: Palliative care will continue to follow for complex medical decision making, advance care planning, and clarification of goals. Return in 4-8 weeks or as needed.  PPS: 40%  HOSPICE ELIGIBILITY/DIAGNOSIS: TBD  CHIEF COMPLAIN: generalized weakness  History obtained from review of EMR, discussion with primary team, and interview with family,  facility staff/caregiver and/or Ms. Debes.   HISTORY OF PRESENT ILLNESS:  Cassidy Smith is a 65 y.o. year old female with Schizoaffective disorder, Bipolar Type, SIADH, chronic anxiety/depression, chronic thrombocytopenia, chronic DVT, CKD,  Neuromuscular disorder, rectal cancer. Patient with residual unresectable polp from colonoscopy with pathology showing high grade dysplasia s/p transanal endoscopic microsurgery on 08/13/2017. Family desires for comfort for patient, not interested in further work up or treatment.  Patient with report of generalized weakness in the context progression of her neuromuscular disorder. Weakness is associated with unsteady gait. No report of recent falls. Patient lives at home with his brother who is his main caregiver, she has a son who lives in [place]. Patient has paid caregivers 8 hrs a day Monday through Friday.  Family report patient completed 5 days course of antibiotics for UTI 5 days ago. Patient denied dysuria, denied fever, denied chills, denied SOB or any uncontrolled pain. Ten systems reviewed and are negative for acute change, except as noted in the HPI.   I reviewed available labs, medications, imaging, studies and related documents from the EMR.  Records reviewed and summarized above.   Vitals:   07/11/20 1132  BP: (!) 118/56  Pulse: 71  Resp: 16  SpO2: 94%    Physical Exam: General: chronically ill and frail appearing, thin, sitting in recliner in NAD EYES: anicteric sclera,  no discharge ENMT: intact hearing, oral mucous membranes moist CV: RRR, no LE edema Pulmonary: LCTA, no increased work of breathing, no cough, room air Abdomen: no ascites GU: deferred MSK: moderate sarcopenia, moves all extremities, ambulatory Skin: warm and dry, no rashes or wounds on visible skin Neuro:  generalized weakness,  mild cognitive impairment Psych: non-anxious affect, A and O x 2 Hem/lymph/immuno: no widespread bruising  Past Medical History:  Diagnosis Date   Anxiety    Bipolar 1 disorder (HCC)    Chronic hip pain    Chronic kidney disease    uti  currently,  hx bladder spasms   Depression    Headache(784.0)    Hyperthyroidism    Ischemic stroke (HCC)    Neuromuscular disorder (HCC)     shaking of hands    SIADH (syndrome of inappropriate ADH production) ([place]) 04/08/2017   Thank you for the opportunity to participate in the care of Ms. Vessey.  The palliative care team will continue to follow. Please call our office at [phone] if we can be of additional assistance.   Jari Favre, DNP, AGPCNP-BC  COVID-19 PATIENT SCREENING TOOL Asked and negative response unless otherwise noted:   Have you had symptoms of covid, tested positive or been in contact with someone with symptoms/positive test in the past 5-10 days?

## 2020-07-22 ENCOUNTER — Emergency Department (HOSPITAL_COMMUNITY)
Admission: EM | Admit: 2020-07-22 | Discharge: 2020-07-22 | Disposition: A | Discharge status: Home / Self Care | Payer: Medicare Other | Source: Home / Self Care | Attending: Emergency Medicine | Admitting: Emergency Medicine

## 2020-07-22 ENCOUNTER — Encounter (HOSPITAL_COMMUNITY): Payer: Self-pay

## 2020-07-22 ENCOUNTER — Other Ambulatory Visit: Payer: Self-pay

## 2020-07-22 ENCOUNTER — Emergency Department (HOSPITAL_COMMUNITY): Payer: Medicare Other

## 2020-07-22 DIAGNOSIS — W19XXXA Unspecified fall, initial encounter: Secondary | ICD-10-CM

## 2020-07-22 DIAGNOSIS — Z79899 Other long term (current) drug therapy: Secondary | ICD-10-CM | POA: Insufficient documentation

## 2020-07-22 DIAGNOSIS — M25571 Pain in right ankle and joints of right foot: Secondary | ICD-10-CM

## 2020-07-22 DIAGNOSIS — Z7982 Long term (current) use of aspirin: Secondary | ICD-10-CM | POA: Insufficient documentation

## 2020-07-22 DIAGNOSIS — F0390 Unspecified dementia without behavioral disturbance: Secondary | ICD-10-CM | POA: Insufficient documentation

## 2020-07-22 DIAGNOSIS — Z87891 Personal history of nicotine dependence: Secondary | ICD-10-CM | POA: Insufficient documentation

## 2020-07-22 DIAGNOSIS — R4182 Altered mental status, unspecified: Secondary | ICD-10-CM | POA: Diagnosis not present

## 2020-07-22 DIAGNOSIS — A4151 Sepsis due to Escherichia coli [E. coli]: Secondary | ICD-10-CM | POA: Diagnosis not present

## 2020-07-22 DIAGNOSIS — S90112A Contusion of left great toe without damage to nail, initial encounter: Secondary | ICD-10-CM | POA: Insufficient documentation

## 2020-07-22 DIAGNOSIS — N189 Chronic kidney disease, unspecified: Secondary | ICD-10-CM | POA: Insufficient documentation

## 2020-07-22 DIAGNOSIS — E039 Hypothyroidism, unspecified: Secondary | ICD-10-CM | POA: Insufficient documentation

## 2020-07-22 MED ORDER — ACETAMINOPHEN 325 MG PO TABS
650.0000 mg | ORAL_TABLET | Freq: Once | ORAL | Status: AC
  Administered 2020-07-22: 650 mg via ORAL
  Filled 2020-07-22: qty 2

## 2020-07-22 NOTE — ED Triage Notes (Signed)
Pt brought to ED via [facility] for right ankle pain following fall on Thursday.

## 2020-07-22 NOTE — Discharge Instructions (Addendum)
X-ray of her right foot and ankle did not show any broken bones.  She has swelling and likely an ankle sprain.  She should wear the Aircast for comfort and apply ice to help with swelling.  She can take Tylenol for pain.  She should elevate her leg is much as possible.  She is okay to walk and put weight on her right foot.  If she continues to have pain recommend she follow-up with her primary care doctor for reassessment.

## 2020-07-22 NOTE — ED Provider Notes (Signed)
[facility] EMERGENCY DEPARTMENT Provider Note   CSN: [id number] Arrival date & time: 07/22/20  1040     History Chief Complaint  Patient presents with   Ankle Pain    Cassidy Smith is a 65 y.o. female with past medical history significant for bipolar 1 disorder, dementia, chronic hip pain, CKD, hypothyroidism, ischemic stroke, SIADH presenting to emergency room today with chief complaint of ankle pain.  Patient is not anticoagulated.  Patient presents today via EMS with chief complaint of left ankle pain.  EMS report that patient fell x2 days ago.  Patient tells me that she fell today.  Patient is unable to provide any further history as she is demented.  Patient does admit to pain in right left foot and ankle.  I called patient's brother [name] listed in chart.  Her nephew answered and states he visits patient frequently.  He did not see her fall.  She has 24/7 nurse with her.  Patient had a mechanical fall today.  She typically ambulates with assistance from the nurse.  He did not see the fall happened.  No medications given prior to arrival. The nurse left the residence behind the ambulance, will wait to see if she arrives to provide more history here.   Past Medical History:  Diagnosis Date   Anxiety    Bipolar 1 disorder ([place])    Chronic hip pain    Chronic kidney disease    uti  currently,  hx bladder spasms   Depression    Headache(784.0)    Hyperthyroidism    Ischemic stroke ([place])    Neuromuscular disorder (HCC)    shaking of hands    SIADH (syndrome of inappropriate ADH production) ([place]) 04/08/2017    Patient Active Problem List   Diagnosis Date Noted   Sepsis with acute hypoxic respiratory failure without septic shock ([place])    Dementia without behavioral disturbance ([place])    Palliative care by specialist    Goals of care, counseling/discussion    Pneumonia of right lower lobe due to infectious organism 11/06/2019   Intracerebral hematoma ([place]) 07/04/2019   Closed  head injury 07/03/2019   Thrombocytopenia ([place]) 07/03/2019   UTI (urinary tract infection) 01/04/2018   Neuromuscular disorder ([place])    Dysuria 10/30/2017   Fever 08/15/2017   Hypokalemia 08/15/2017   Altered mental state 48/18/5631   Acute metabolic encephalopathy 49/70/2637   Valproic acid toxicity 08/15/2017   Altered mental status    Ischemic stroke ([place])    GERD (gastroesophageal reflux disease) 05/12/2017   GI bleed    SIADH (syndrome of inappropriate ADH production) ([place]) 04/08/2017   Dysplastic rectal polyp    Protein-calorie malnutrition, severe 04/07/2017   Pressure injury of skin 04/04/2017   Anticoagulated 04/03/2017   Stool bloody 04/03/2017   Current every day smoker 04/03/2017   Rectal bleeding    Chronic diarrhea    Hyperthyroidism    S/P ORIF (open reduction internal fixation) fracture right hip IM nail 03/07/17 03/24/2017   Hypomagnesemia 03/07/2017   Fall    Closed intertrochanteric fracture of hip, right, initial encounter ([place]) 03/06/2017   Radial styloid fracture: right 03/06/2017   Anemia 03/06/2017   Orthostatic hypotension 06/07/2015   Lower urinary tract infectious disease 06/07/2015   Rhabdomyolysis 06/07/2015   White matter abnormality on MRI of brain 05/14/2013   History of depression 05/14/2013   Hx of anxiety disorder 05/14/2013   Bipolar I disorder, most recent episode (or current) manic, unspecified 05/14/2013  Bipolar 1 disorder ([place]) 05/14/2013   Hyponatremia 05/12/2013   Bipolar disorder ([place]) 05/12/2013   Lacunar infarct, acute ([place]) 05/12/2013    Past Surgical History:  Procedure Laterality Date   COLONOSCOPY WITH PROPOFOL N/A 04/05/2017   Procedure: COLONOSCOPY WITH PROPOFOL;  Surgeon: Rogene Houston, MD;  [facility];  Service: Endoscopy;  Laterality: N/A;   INTRAMEDULLARY (IM) NAIL INTERTROCHANTERIC Right 03/07/2017   Procedure: OPEN TREATMENT INTERNAL FIXATION RIGHT HIP WITH GAMA INTRAMEDULARY NAIL;  Surgeon:  Carole Civil, MD;  Location: [facility];  Service: Orthopedics;  Laterality: Right;   MULTIPLE EXTRACTIONS WITH ALVEOLOPLASTY N/A 10/30/2012   Procedure: MULTIPLE EXTRACION 5, 6, 8, 9, 10 ,18, 19, 31 WITH MAXILLARY RIGHT AND LEFT  ALVEOLOPLASTY REDUCE MAXILLARY LEFT TUBEROSITY;  Surgeon: Gae Bon, DDS;  Location: [facility];  Service: Oral Surgery;  Laterality: N/A;   POLYPECTOMY  04/05/2017   Procedure: POLYPECTOMY;  Surgeon: Rogene Houston, MD;  Location[facility];  Service: Endoscopy;;  recto-sigmoid, rectum     OB History   No obstetric history on file.     Family History  Problem Relation Age of Onset   Breast cancer Mother    Cancer - Other Mother    Alcoholism Father    Colon cancer Neg Hx    Colon polyps Neg Hx     Social History   Tobacco Use   Smoking status: Former    Packs/day: 0.50    Years: 20.00    Pack years: 10.00    Types: Cigarettes    Quit date: 05/05/2017    Years since quitting: 3.2   Smokeless tobacco: Never   Tobacco comments:    patient states she quit one week ago  Vaping Use   Vaping Use: Never used  Substance Use Topics   Alcohol use: No   Drug use: No    Home Medications Prior to Admission medications   Medication Sig Start Date End Date Taking? Authorizing Provider  amLODipine (NORVASC) 2.5 MG tablet Take 1 tablet (2.5 mg total) by mouth daily. 11/09/19 12/09/19  Manuella Ghazi, Pratik D, DO  aspirin EC 81 MG tablet Take 81 mg by mouth daily. Swallow whole.    [provider]  atorvastatin (LIPITOR) 20 MG tablet Take 1 tablet (20 mg total) by mouth daily at 6 PM. 06/11/17   Barton Dubois, MD  benztropine (COGENTIN) 1 MG tablet Take 1 tablet (1 mg total) by mouth 2 (two) times daily. Patient not taking: Reported on 11/06/2019 01/05/18   Murlean Iba, MD  Cholecalciferol (VITAMIN D-3) 125 MCG (5000 UT) TABS Take 5,000 Units by mouth daily.    [provider]  divalproex (DEPAKOTE ER) 500 MG 24 hr tablet Take 2  tablets (1,000 mg total) by mouth at bedtime. Patient taking differently: Take 1,500 mg by mouth at bedtime.  08/16/17   Orson Eva, MD  gabapentin (NEURONTIN) 100 MG capsule Take 1 capsule (100 mg total) by mouth 3 (three) times daily. Patient taking differently: Take 100-200 mg by mouth See admin instructions. Take one capsule (100 mg) by mouth every morning and two capsules (200 mg) at night 05/13/17   Carole Civil, MD  Ipratropium-Albuterol (COMBIVENT RESPIMAT) 20-100 MCG/ACT AERS respimat Inhale 1 puff into the lungs every 6 (six) hours as needed for wheezing or shortness of breath. 11/08/19   Manuella Ghazi, Pratik D, DO  Multiple Vitamin (MULTIVITAMIN WITH MINERALS) TABS tablet Take 1 tablet by mouth daily. Centrum Silver  [provider]  nitrofurantoin, macrocrystal-monohydrate, (MACROBID) 100 MG capsule Take 1 capsule (100 mg total) by mouth 2 (two) times daily. 01/20/20   Lajean Saver, MD  pantoprazole (PROTONIX) 40 MG tablet Take 40 mg by mouth daily.    [provider]  potassium chloride (K-DUR,KLOR-CON) 10 MEQ tablet Take 1 tablet (10 mEq total) by mouth every other day. Patient taking differently: Take 10 mEq by mouth daily.  01/08/18   Johnson, Clanford L, MD  risperiDONE (RISPERDAL) 1 MG tablet Take 1 tablet (1 mg total) by mouth at bedtime as needed for up to 15 days (agitation). Patient not taking: Reported on 07/03/2019 01/05/18 01/20/18  Murlean Iba, MD  risperiDONE (RISPERDAL) 2 MG tablet Take 2 mg by mouth at bedtime. 06/21/19   [provider]  traZODone (DESYREL) 100 MG tablet Take 100 mg by mouth at bedtime.     [provider]  vitamin B-12 (CYANOCOBALAMIN) 1000 MCG tablet Take 1 tablet (1,000 mcg total) by mouth daily. Patient not taking: Reported on 11/06/2019 06/11/17   Barton Dubois, MD    Allergies    Patient has no known allergies.  Review of Systems   Review of Systems  Unable to perform ROS: Dementia   Physical  Exam Updated Vital Signs BP 113/85 (BP Location: Right Arm)   Pulse 75   Temp 97.6 F (36.4 C) (Oral)   Resp 16   Ht 5\' 4"  (1.626 m)   Wt 54.4 kg   SpO2 94%   BMI 20.60 kg/m   Physical Exam Vitals and nursing note reviewed.  Constitutional:      Appearance: She is well-developed. She is not ill-appearing or toxic-appearing.     Comments: Thin appearing female  HENT:     Head: Normocephalic and atraumatic. No raccoon eyes or Battle's sign.     Jaw: There is normal jaw occlusion.     Comments: No tenderness to palpation of skull. No deformities or crepitus noted. No open wounds, abrasions or lacerations. No facial tenderness    Right Ear: No hemotympanum.     Left Ear: No hemotympanum.     Nose: Nose normal.     Right Nostril: No epistaxis or septal hematoma.     Left Nostril: No epistaxis or septal hematoma.     Mouth/Throat:     Lips: Pink.     Mouth: Mucous membranes are dry. No oral lesions.     Pharynx: Oropharynx is clear. Uvula midline.  Eyes:     General: No scleral icterus.       Right eye: No discharge.        Left eye: No discharge.     Conjunctiva/sclera: Conjunctivae normal.  Neck:     Vascular: No JVD.     Comments: Full ROM intact without spinous process TTP. No bony stepoffs or deformities, no paraspinous muscle TTP or muscle spasms. No rigidity or meningeal signs. No bruising, erythema, or swelling.   Cardiovascular:     Rate and Rhythm: Normal rate and regular rhythm.     Pulses: Normal pulses.          Dorsalis pedis pulses are 2+ on the right side and 2+ on the left side.     Heart sounds: Normal heart sounds.  Pulmonary:     Effort: Pulmonary effort is normal.     Breath sounds: Normal breath sounds.  Abdominal:     General: There is no distension.  Musculoskeletal:  General: Normal range of motion.     Cervical back: Normal range of motion.     Right lower leg: No edema.     Left lower leg: No edema.     Comments: Patient is moving all  extremities continuously.  No obvious deformities noted.  Compartments are soft in all extremities.   No thoracic, or lumbar spinal tenderness to palpation.  No paraspinal tenderness. No step offs, crepitus or deformity palpated.     Swelling to right ankle and foot with bruising noted to left great toe.  Able to wiggle all toes.  Toenails are painted, unable to assess cap refill.  No wounds.  No swelling noted to right knee.  Pelvis is stable.  Skin:    General: Skin is warm and dry.     Comments: Equal tactile temperature in all extremities.  Neurological:     GCS: GCS eye subscore is 4. GCS verbal subscore is 5. GCS motor subscore is 6.     Comments: Fluent speech, no facial droop.  At baseline per nephew.  Psychiatric:        Behavior: Behavior normal.    ED Results / Procedures / Treatments   Labs (all labs ordered are listed, but only abnormal results are displayed) Labs Reviewed - No data to display  EKG None  Radiology DG Ankle Complete Right  Result Date: 07/22/2020 CLINICAL DATA:  Patient fell this past Thursday with persistent right foot and ankle pain. EXAM: RIGHT ANKLE - COMPLETE 3+ VIEW COMPARISON:  Right foot radiographs-earlier same day FINDINGS: Apparent diffuse soft tissue swelling about the imaged lower leg, ankle and hindfoot. No associated fracture or dislocation. Joint spaces appear preserved. The ankle mortise appears preserved. Nodular structure posterior to the calcaneus may be dermal in etiology. No radiopaque foreign body. No plantar calcaneal spur. IMPRESSION: Specific diffuse soft tissue swelling about the imaged lower leg, ankle and hindfoot, without associated fracture or dislocation. Electronically Signed   By: Sandi Mariscal M.D.   On: 07/22/2020 11:52   DG Foot Complete Right  Result Date: 07/22/2020 CLINICAL DATA:  Patient fell this past Thursday with persistent right foot and ankle pain. EXAM: RIGHT FOOT COMPLETE - 3+ VIEW COMPARISON:  Right ankle  radiographs-earlier same day FINDINGS: Diffuse soft tissue swelling about the foot and ankle. No associated fracture or dislocation. Mild hallux valgus deformity. Joint spaces are preserved. No erosions. No plantar calcaneal spur. Nodular opacity posterior to the calcaneus may be dermal in etiology. IMPRESSION: 1. Nonspecific diffuse soft tissue swelling about the ankle without associated fracture or dislocation. 2. Mild hallux valgus deformity without associated degenerative change. Electronically Signed   By: Sandi Mariscal M.D.   On: 07/22/2020 11:54    Procedures Procedures   Medications Ordered in ED Medications  acetaminophen (TYLENOL) tablet 650 mg (650 mg Oral Given 07/22/20 1137)    ED Course  I have reviewed the triage vital signs and the nursing notes.  Pertinent labs & imaging results that were available during my care of the patient were reviewed by me and considered in my medical decision making (see chart for details).    MDM Rules/Calculators/A&P                          History provided by patient with additional history obtained from chart review.    Presenting after mechanical fall today.  Patient is demented, otherwise well-appearing.  She has swelling of right ankle without obvious  deformity.  She is neurovascular intact distally.  X-ray performed of right foot and ankle without signs of acute fracture or dislocation.  She does have nonspecific diffuse soft tissue swelling about the ankle.  Patient is able to ambulate with assistance.  Aircast applied for comfort.  Tylenol given for pain.  I called patient's caregiver her brother [name].  He confirmed that patient is at baseline mental status.  He knows that she did not hit her head or have any loss of consciousness from the fall today.  It was mechanical as she is unsteady on her feet, this is not new.  I discussed possibility of further work-up with head CT however he does not feel that is necessary.  I agree with this plan of  care given her reassuring exam.  Recommended Tylenol for pain at home, wearing the splint for comfort and follow-up with primary care doctor if pain continues.  Patient is stable to be discharged home.  Nephew will be here to pick her up. Strict return precautions discussed with patient as well as her brother on the phone. Findings and plan of care discussed with supervising physician Dr. Roslynn Amble.    Portions of this note were generated with [employer]. Dictation errors may occur despite best attempts at proofreading.   Final Clinical Impression(s) / ED Diagnoses Final diagnoses:  Acute right ankle pain    Rx / DC Orders ED Discharge Orders     None        Lewanda Rife 07/22/20 1232    Lucrezia Starch, MD 07/25/20 1601

## 2020-07-22 NOTE — ED Notes (Signed)
X-ray at bedside

## 2020-07-25 ENCOUNTER — Inpatient Hospital Stay (HOSPITAL_COMMUNITY): Payer: Medicare Other

## 2020-07-25 ENCOUNTER — Encounter (HOSPITAL_COMMUNITY): Payer: Self-pay

## 2020-07-25 ENCOUNTER — Other Ambulatory Visit: Payer: Self-pay

## 2020-07-25 ENCOUNTER — Inpatient Hospital Stay (HOSPITAL_COMMUNITY)
Admission: EM | Admit: 2020-07-25 | Discharge: 2020-07-28 | DRG: 871 | Disposition: A | Discharge status: Home Health | Payer: Medicare Other | Attending: Internal Medicine | Admitting: Internal Medicine

## 2020-07-25 ENCOUNTER — Emergency Department (HOSPITAL_COMMUNITY): Payer: Medicare Other

## 2020-07-25 DIAGNOSIS — Y92019 Unspecified place in single-family (private) house as the place of occurrence of the external cause: Secondary | ICD-10-CM | POA: Diagnosis not present

## 2020-07-25 DIAGNOSIS — G9341 Metabolic encephalopathy: Secondary | ICD-10-CM | POA: Diagnosis present

## 2020-07-25 DIAGNOSIS — Z20822 Contact with and (suspected) exposure to covid-19: Secondary | ICD-10-CM | POA: Diagnosis present

## 2020-07-25 DIAGNOSIS — Z803 Family history of malignant neoplasm of breast: Secondary | ICD-10-CM | POA: Diagnosis not present

## 2020-07-25 DIAGNOSIS — F319 Bipolar disorder, unspecified: Secondary | ICD-10-CM | POA: Diagnosis present

## 2020-07-25 DIAGNOSIS — Z1612 Extended spectrum beta lactamase (ESBL) resistance: Secondary | ICD-10-CM | POA: Diagnosis present

## 2020-07-25 DIAGNOSIS — R410 Disorientation, unspecified: Secondary | ICD-10-CM

## 2020-07-25 DIAGNOSIS — G9340 Encephalopathy, unspecified: Secondary | ICD-10-CM | POA: Diagnosis not present

## 2020-07-25 DIAGNOSIS — A4151 Sepsis due to Escherichia coli [E. coli]: Secondary | ICD-10-CM | POA: Diagnosis present

## 2020-07-25 DIAGNOSIS — E0590 Thyrotoxicosis, unspecified without thyrotoxic crisis or storm: Secondary | ICD-10-CM | POA: Diagnosis present

## 2020-07-25 DIAGNOSIS — D6959 Other secondary thrombocytopenia: Secondary | ICD-10-CM | POA: Diagnosis present

## 2020-07-25 DIAGNOSIS — E039 Hypothyroidism, unspecified: Secondary | ICD-10-CM | POA: Diagnosis present

## 2020-07-25 DIAGNOSIS — E222 Syndrome of inappropriate secretion of antidiuretic hormone: Secondary | ICD-10-CM | POA: Diagnosis present

## 2020-07-25 DIAGNOSIS — W1830XA Fall on same level, unspecified, initial encounter: Secondary | ICD-10-CM | POA: Diagnosis present

## 2020-07-25 DIAGNOSIS — E785 Hyperlipidemia, unspecified: Secondary | ICD-10-CM | POA: Diagnosis present

## 2020-07-25 DIAGNOSIS — Z87440 Personal history of urinary (tract) infections: Secondary | ICD-10-CM

## 2020-07-25 DIAGNOSIS — G8929 Other chronic pain: Secondary | ICD-10-CM | POA: Diagnosis present

## 2020-07-25 DIAGNOSIS — Z87891 Personal history of nicotine dependence: Secondary | ICD-10-CM | POA: Diagnosis not present

## 2020-07-25 DIAGNOSIS — S90112A Contusion of left great toe without damage to nail, initial encounter: Secondary | ICD-10-CM | POA: Diagnosis present

## 2020-07-25 DIAGNOSIS — R6889 Other general symptoms and signs: Secondary | ICD-10-CM

## 2020-07-25 DIAGNOSIS — R6520 Severe sepsis without septic shock: Secondary | ICD-10-CM

## 2020-07-25 DIAGNOSIS — A419 Sepsis, unspecified organism: Secondary | ICD-10-CM | POA: Diagnosis present

## 2020-07-25 DIAGNOSIS — Z79899 Other long term (current) drug therapy: Secondary | ICD-10-CM

## 2020-07-25 DIAGNOSIS — M25559 Pain in unspecified hip: Secondary | ICD-10-CM | POA: Diagnosis present

## 2020-07-25 DIAGNOSIS — Z66 Do not resuscitate: Secondary | ICD-10-CM | POA: Diagnosis present

## 2020-07-25 DIAGNOSIS — F419 Anxiety disorder, unspecified: Secondary | ICD-10-CM | POA: Diagnosis present

## 2020-07-25 DIAGNOSIS — Z8673 Personal history of transient ischemic attack (TIA), and cerebral infarction without residual deficits: Secondary | ICD-10-CM

## 2020-07-25 DIAGNOSIS — N390 Urinary tract infection, site not specified: Secondary | ICD-10-CM | POA: Diagnosis present

## 2020-07-25 DIAGNOSIS — K219 Gastro-esophageal reflux disease without esophagitis: Secondary | ICD-10-CM | POA: Diagnosis present

## 2020-07-25 DIAGNOSIS — F0390 Unspecified dementia without behavioral disturbance: Secondary | ICD-10-CM | POA: Diagnosis present

## 2020-07-25 DIAGNOSIS — Z8619 Personal history of other infectious and parasitic diseases: Secondary | ICD-10-CM

## 2020-07-25 DIAGNOSIS — E860 Dehydration: Secondary | ICD-10-CM | POA: Diagnosis present

## 2020-07-25 DIAGNOSIS — M25571 Pain in right ankle and joints of right foot: Secondary | ICD-10-CM | POA: Diagnosis present

## 2020-07-25 DIAGNOSIS — Z7982 Long term (current) use of aspirin: Secondary | ICD-10-CM

## 2020-07-25 DIAGNOSIS — Z8659 Personal history of other mental and behavioral disorders: Secondary | ICD-10-CM

## 2020-07-25 DIAGNOSIS — R4182 Altered mental status, unspecified: Secondary | ICD-10-CM | POA: Diagnosis present

## 2020-07-25 LAB — APTT: aPTT: 25 seconds (ref 24–36)

## 2020-07-25 LAB — CBC WITH DIFFERENTIAL/PLATELET
Abs Immature Granulocytes: 0.04 10*3/uL (ref 0.00–0.07)
Basophils Absolute: 0 10*3/uL (ref 0.0–0.1)
Basophils Relative: 0 %
Eosinophils Absolute: 0.1 10*3/uL (ref 0.0–0.5)
Eosinophils Relative: 1 %
HCT: 39.5 % (ref 36.0–46.0)
Hemoglobin: 12.6 g/dL (ref 12.0–15.0)
Immature Granulocytes: 0 %
Lymphocytes Relative: 18 %
Lymphs Abs: 2 10*3/uL (ref 0.7–4.0)
MCH: 30.2 pg (ref 26.0–34.0)
MCHC: 31.9 g/dL (ref 30.0–36.0)
MCV: 94.7 fL (ref 80.0–100.0)
Monocytes Absolute: 1.1 10*3/uL — ABNORMAL HIGH (ref 0.1–1.0)
Monocytes Relative: 10 %
Neutro Abs: 8 10*3/uL — ABNORMAL HIGH (ref 1.7–7.7)
Neutrophils Relative %: 71 %
Platelets: 94 10*3/uL — ABNORMAL LOW (ref 150–400)
RBC: 4.17 MIL/uL (ref 3.87–5.11)
RDW: 16.2 % — ABNORMAL HIGH (ref 11.5–15.5)
WBC: 11.2 10*3/uL — ABNORMAL HIGH (ref 4.0–10.5)
nRBC: 0 % (ref 0.0–0.2)

## 2020-07-25 LAB — PROTIME-INR
INR: 1 (ref 0.8–1.2)
Prothrombin Time: 12.7 seconds (ref 11.4–15.2)

## 2020-07-25 LAB — URINALYSIS, ROUTINE W REFLEX MICROSCOPIC
Bilirubin Urine: NEGATIVE
Glucose, UA: NEGATIVE mg/dL
Hgb urine dipstick: NEGATIVE
Ketones, ur: NEGATIVE mg/dL
Leukocytes,Ua: NEGATIVE
Nitrite: POSITIVE — AB
Protein, ur: NEGATIVE mg/dL
Specific Gravity, Urine: 1.011 (ref 1.005–1.030)
pH: 9 — ABNORMAL HIGH (ref 5.0–8.0)

## 2020-07-25 LAB — LACTIC ACID, PLASMA
Lactic Acid, Venous: 1.2 mmol/L (ref 0.5–1.9)
Lactic Acid, Venous: 1 mmol/L (ref 0.5–1.9)

## 2020-07-25 LAB — CBG MONITORING, ED: Glucose-Capillary: 121 mg/dL — ABNORMAL HIGH (ref 70–99)

## 2020-07-25 LAB — COMPREHENSIVE METABOLIC PANEL
ALT: 10 U/L (ref 0–44)
AST: 18 U/L (ref 15–41)
Albumin: 3.3 g/dL — ABNORMAL LOW (ref 3.5–5.0)
Alkaline Phosphatase: 79 U/L (ref 38–126)
Anion gap: 9 (ref 5–15)
BUN: 16 mg/dL (ref 8–23)
CO2: 29 mmol/L (ref 22–32)
Calcium: 9.3 mg/dL (ref 8.9–10.3)
Chloride: 97 mmol/L — ABNORMAL LOW (ref 98–111)
Creatinine, Ser: 0.68 mg/dL (ref 0.44–1.00)
GFR, Estimated: >60 mL/min (ref >60)
Glucose, Bld: 105 mg/dL — ABNORMAL HIGH (ref 70–99)
Potassium: 3.9 mmol/L (ref 3.5–5.1)
Sodium: 135 mmol/L (ref 135–145)
Total Bilirubin: 0.5 mg/dL (ref 0.3–1.2)
Total Protein: 7.3 g/dL (ref 6.5–8.1)

## 2020-07-25 LAB — RESP PANEL BY RT-PCR (FLU A&B, COVID) ARPGX2
Influenza A by PCR: NEGATIVE
Influenza B by PCR: NEGATIVE
SARS Coronavirus 2 by RT PCR: NEGATIVE

## 2020-07-25 LAB — TSH: TSH: 1.166 u[IU]/mL (ref 0.350–4.500)

## 2020-07-25 LAB — POC SARS CORONAVIRUS 2 AG -  ED: SARS Coronavirus 2 Ag: NEGATIVE

## 2020-07-25 MED ORDER — PIPERACILLIN-TAZOBACTAM 3.375 G IVPB 30 MIN
3.3750 g | Freq: Once | INTRAVENOUS | Status: AC
  Administered 2020-07-25: 3.375 g via INTRAVENOUS
  Filled 2020-07-25: qty 50

## 2020-07-25 MED ORDER — LACTATED RINGERS IV SOLN: INTRAVENOUS | Status: DC

## 2020-07-25 MED ORDER — PIPERACILLIN-TAZOBACTAM 3.375 G IVPB: 3.3750 g | Freq: Three times a day (TID) | INTRAVENOUS | Status: DC

## 2020-07-25 MED ORDER — POLYETHYLENE GLYCOL 3350 17 G PO PACK: 17.0000 g | PACK | Freq: Every day | ORAL | Status: DC | PRN

## 2020-07-25 MED ORDER — ACETAMINOPHEN 325 MG PO TABS: 650.0000 mg | ORAL_TABLET | Freq: Four times a day (QID) | ORAL | Status: DC | PRN

## 2020-07-25 MED ORDER — PIPERACILLIN-TAZOBACTAM 3.375 G IVPB
3.3750 g | Freq: Three times a day (TID) | INTRAVENOUS | Status: DC
  Administered 2020-07-26 – 2020-07-28 (×7): 3.375 g via INTRAVENOUS
  Filled 2020-07-25 (×7): qty 50

## 2020-07-25 MED ORDER — ONDANSETRON HCL 4 MG/2ML IJ SOLN: 4.0000 mg | Freq: Four times a day (QID) | INTRAMUSCULAR | Status: DC | PRN

## 2020-07-25 MED ORDER — LACTATED RINGERS IV BOLUS (SEPSIS)
1000.0000 mL | Freq: Once | INTRAVENOUS | Status: AC
  Administered 2020-07-25: 1000 mL via INTRAVENOUS

## 2020-07-25 MED ORDER — LACTATED RINGERS IV BOLUS
1000.0000 mL | Freq: Once | INTRAVENOUS | Status: AC
  Administered 2020-07-25: 1000 mL via INTRAVENOUS

## 2020-07-25 MED ORDER — ACETAMINOPHEN 650 MG RE SUPP: 650.0000 mg | Freq: Four times a day (QID) | RECTAL | Status: DC | PRN

## 2020-07-25 MED ORDER — ONDANSETRON HCL 4 MG PO TABS: 4.0000 mg | ORAL_TABLET | Freq: Four times a day (QID) | ORAL | Status: DC | PRN

## 2020-07-25 NOTE — ED Provider Notes (Signed)
[facility] EMERGENCY DEPARTMENT Provider Note   CSN: [id number] Arrival date & time: 07/25/20  1733     History Chief Complaint  Patient presents with   Altered Mental Status    Cassidy Smith is a 65 y.o. female.  HPI    65 year old female comes in with chief complaint of altered mental status and rigors.  I called patient's son, who informed me that he lives in [place].  He requested that I speak with the home health nurse.  I called [phone] and spoke with Ms. Davy Pique, who is with the home health team.  They informed me that patient has 2 home health agency, one that sees her from 8-12 and then her team that is there after 4 PM.  Patient was allegedly normal at noon, but when her team went to check on her she was confused.  Over time they also noted increased weakness, patient was not following all the commands, she was not coherent with a response and then she started having chills and tremors.   Patient has had similar changes with UTI, just not this pronounced.  Patient is oriented to self and location.  She is not answering all the questions that I am asking her.  She tells me that she is not having any pain.   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    Patient Active Problem List   Diagnosis Date Noted   Sepsis with acute hypoxic respiratory failure without septic shock ([place])    Dementia without behavioral disturbance ([place])    Palliative care by specialist    Goals of care, counseling/discussion    Pneumonia of right lower lobe due to infectious organism 11/06/2019   Intracerebral hematoma ([place]) 07/04/2019   Closed head injury 07/03/2019    Thrombocytopenia ([place]) 07/03/2019   UTI (urinary tract infection) 01/04/2018   Neuromuscular disorder ([place])    Dysuria 10/30/2017   Fever 08/15/2017   Hypokalemia 08/15/2017   Altered mental state 69/48/5462   Acute metabolic encephalopathy 70/35/0093   Valproic acid toxicity 08/15/2017   Altered mental status    Ischemic stroke ([place])    GERD (gastroesophageal reflux disease) 05/12/2017   GI bleed    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   Dysplastic rectal polyp    Protein-calorie malnutrition, severe 04/07/2017   Pressure injury of skin 04/04/2017   Anticoagulated 04/03/2017   Stool bloody 04/03/2017   Current every day smoker 04/03/2017   Rectal bleeding    Chronic diarrhea    Hyperthyroidism    S/P ORIF (open reduction internal fixation) fracture right hip IM nail 03/07/17 03/24/2017   Hypomagnesemia 03/07/2017   Fall    Closed intertrochanteric fracture of hip, right, initial encounter ([place]) 03/06/2017   Radial styloid fracture: right 03/06/2017   Anemia 03/06/2017   Orthostatic hypotension 06/07/2015   Lower urinary tract infectious disease 06/07/2015   Rhabdomyolysis 06/07/2015   White matter abnormality on MRI of brain 05/14/2013   History of depression 05/14/2013   Hx of anxiety disorder 05/14/2013   Bipolar I disorder, most recent episode (  or current) manic, unspecified 05/14/2013   Bipolar 1 disorder ([place]) 05/14/2013   Hyponatremia 05/12/2013   Bipolar disorder ([place]) 05/12/2013   Lacunar infarct, acute ([place]) 05/12/2013    Past Surgical History:  Procedure Laterality Date   COLONOSCOPY WITH PROPOFOL N/A 04/05/2017   Procedure: COLONOSCOPY WITH PROPOFOL;  Surgeon: Rogene Houston, MD;  Location: [facility];  Service: Endoscopy;  Laterality: N/A;   INTRAMEDULLARY (IM) NAIL INTERTROCHANTERIC Right 03/07/2017   Procedure: OPEN TREATMENT INTERNAL FIXATION RIGHT HIP WITH GAMA INTRAMEDULARY NAIL;  Surgeon: Carole Civil, MD;   Location: [facility] ORS;  Service: Orthopedics;  Laterality: Right;   MULTIPLE EXTRACTIONS WITH ALVEOLOPLASTY N/A 10/30/2012   Procedure: MULTIPLE EXTRACION 5, 6, 8, 9, 10 ,18, 19, 31 WITH MAXILLARY RIGHT AND LEFT  ALVEOLOPLASTY REDUCE MAXILLARY LEFT TUBEROSITY;  Surgeon: Gae Bon, DDS;  Location: [facility];  Service: Oral Surgery;  Laterality: N/A;   POLYPECTOMY  04/05/2017   Procedure: POLYPECTOMY;  Surgeon: Rogene Houston, MD;  Location: [facility];  Service: Endoscopy;;  recto-sigmoid, rectum     OB History   No obstetric history on file.     Family History  Problem Relation Age of Onset   Breast cancer Mother    Cancer - Other Mother    Alcoholism Father    Colon cancer Neg Hx    Colon polyps Neg Hx     Social History   Tobacco Use   Smoking status: Former    Packs/day: 0.50    Years: 20.00    Pack years: 10.00    Types: Cigarettes    Quit date: 05/05/2017    Years since quitting: 3.2   Smokeless tobacco: Never   Tobacco comments:    patient states she quit one week ago  Vaping Use   Vaping Use: Never used  Substance Use Topics   Alcohol use: No   Drug use: No    Home Medications Prior to Admission medications   Medication Sig Start Date End Date Taking? Authorizing Provider  amLODipine (NORVASC) 2.5 MG tablet Take 1 tablet (2.5 mg total) by mouth daily. 11/09/19 12/09/19  Manuella Ghazi, Pratik D, DO  aspirin EC 81 MG tablet Take 81 mg by mouth daily. Swallow whole.    [provider]  atorvastatin (LIPITOR) 20 MG tablet Take 1 tablet (20 mg total) by mouth daily at 6 PM. 06/11/17   Barton Dubois, MD  benztropine (COGENTIN) 1 MG tablet Take 1 tablet (1 mg total) by mouth 2 (two) times daily. Patient not taking: Reported on 11/06/2019 01/05/18   Murlean Iba, MD  Cholecalciferol (VITAMIN D-3) 125 MCG (5000 UT) TABS Take 5,000 Units by mouth daily.    [provider]  divalproex (DEPAKOTE ER) 500 MG 24 hr tablet Take 2 tablets (1,000 mg total) by  mouth at bedtime. Patient taking differently: Take 1,500 mg by mouth at bedtime.  08/16/17   Orson Eva, MD  gabapentin (NEURONTIN) 100 MG capsule Take 1 capsule (100 mg total) by mouth 3 (three) times daily. Patient taking differently: Take 100-200 mg by mouth See admin instructions. Take one capsule (100 mg) by mouth every morning and two capsules (200 mg) at night 05/13/17   Carole Civil, MD  Ipratropium-Albuterol (COMBIVENT RESPIMAT) 20-100 MCG/ACT AERS respimat Inhale 1 puff into the lungs every 6 (six) hours as needed for wheezing or shortness of breath. 11/08/19   Manuella Ghazi, Pratik D, DO  Multiple Vitamin (MULTIVITAMIN WITH MINERALS) TABS tablet Take 1 tablet  by mouth daily. Centrum Silver    [provider]  nitrofurantoin, macrocrystal-monohydrate, (MACROBID) 100 MG capsule Take 1 capsule (100 mg total) by mouth 2 (two) times daily. 01/20/20   Lajean Saver, MD  pantoprazole (PROTONIX) 40 MG tablet Take 40 mg by mouth daily.    [provider]  potassium chloride (K-DUR,KLOR-CON) 10 MEQ tablet Take 1 tablet (10 mEq total) by mouth every other day. Patient taking differently: Take 10 mEq by mouth daily.  01/08/18   Johnson, Clanford L, MD  risperiDONE (RISPERDAL) 1 MG tablet Take 1 tablet (1 mg total) by mouth at bedtime as needed for up to 15 days (agitation). Patient not taking: Reported on 07/03/2019 01/05/18 01/20/18  Murlean Iba, MD  risperiDONE (RISPERDAL) 2 MG tablet Take 2 mg by mouth at bedtime. 06/21/19   [provider]  traZODone (DESYREL) 100 MG tablet Take 100 mg by mouth at bedtime.     [provider]  vitamin B-12 (CYANOCOBALAMIN) 1000 MCG tablet Take 1 tablet (1,000 mcg total) by mouth daily. Patient not taking: Reported on 11/06/2019 06/11/17   Barton Dubois, MD    Allergies    Patient has no known allergies.  Review of Systems   Review of Systems  Unable to perform ROS: Mental status change   Physical Exam Updated Vital  Signs BP (!) 158/70   Pulse 81   Temp (!) 100.8 F (38.2 C) (Rectal)   Resp (!) 21   Ht 5\' 4"  (1.626 m)   Wt 54.4 kg   SpO2 91%   BMI 20.59 kg/m   Physical Exam Vitals and nursing note reviewed.  Constitutional:      Appearance: She is well-developed. She is not diaphoretic.  HENT:     Head: Atraumatic.  Eyes:     Pupils: Pupils are equal, round, and reactive to light.     Comments: No nystagmus  Cardiovascular:     Rate and Rhythm: Normal rate.  Pulmonary:     Effort: Pulmonary effort is normal.  Abdominal:     Tenderness: There is no abdominal tenderness.  Musculoskeletal:     Cervical back: Normal range of motion and neck supple.  Skin:    General: Skin is warm and dry.  Neurological:     Mental Status: She is alert. She is disoriented.     Cranial Nerves: No cranial nerve deficit.     Sensory: No sensory deficit.     Comments: Moving all 4 extremities    ED Results / Procedures / Treatments   Labs (all labs ordered are listed, but only abnormal results are displayed) Labs Reviewed  COMPREHENSIVE METABOLIC PANEL - Abnormal; Notable for the following components:      Result Value   Chloride 97 (*)    Glucose, Bld 105 (*)    Albumin 3.3 (*)    All other components within normal limits  CBC WITH DIFFERENTIAL/PLATELET - Abnormal; Notable for the following components:   WBC 11.2 (*)    RDW 16.2 (*)    Platelets 94 (*)    Neutro Abs 8.0 (*)    Monocytes Absolute 1.1 (*)    All other components within normal limits  CBG MONITORING, ED - Abnormal; Notable for the following components:   Glucose-Capillary 121 (*)    All other components within normal limits  URINE CULTURE  CULTURE, BLOOD (ROUTINE X 2)  CULTURE, BLOOD (ROUTINE X 2)  LACTIC ACID, PLASMA  PROTIME-INR  APTT  LACTIC ACID, PLASMA  URINALYSIS, ROUTINE W REFLEX MICROSCOPIC  POC SARS CORONAVIRUS 2 AG -  ED    EKG EKG Interpretation  Date/Time:  Tuesday July 25 2020 17:53:16 EDT Ventricular  Rate:  83 PR Interval:  153 QRS Duration: 192 QT Interval:  391 QTC Calculation: 460 R Axis:   -14 Text Interpretation: Sinus rhythm Consider right atrial enlargement Nonspecific intraventricular conduction delay No acute changes Confirmed by Varney Biles [phone]) on 07/25/2020 6:50:05 PM  Radiology No results found.  Procedures .Critical Care  Date/Time: 07/25/2020 6:53 PM Performed by: Varney Biles, MD Authorized by: Varney Biles, MD   Critical care provider statement:    Critical care time (minutes):  41   Critical care was necessary to treat or prevent imminent or life-threatening deterioration of the following conditions:  Circulatory failure   Critical care was time spent personally by me on the following activities:  Discussions with consultants, evaluation of patient's response to treatment, examination of patient, ordering and performing treatments and interventions, ordering and review of laboratory studies, ordering and review of radiographic studies, pulse oximetry, re-evaluation of patient's condition, obtaining history from patient or surrogate and review of old charts   Medications Ordered in ED Medications  lactated ringers infusion (has no administration in time range)  lactated ringers bolus 1,000 mL (1,000 mLs Intravenous New Bag/Given 07/25/20 1840)  piperacillin-tazobactam (ZOSYN) IVPB 3.375 g (3.375 g Intravenous New Bag/Given 07/25/20 1846)  piperacillin-tazobactam (ZOSYN) IVPB 3.375 g (has no administration in time range)    ED Course  I have reviewed the triage vital signs and the nursing notes.  Pertinent labs & imaging results that were available during my care of the patient were reviewed by me and considered in my medical decision making (see chart for details).  Clinical Course as of 07/26/20 2346  Tue Jul 25, 2020  1852 SARS Coronavirus 2 Ag: Negative Rapid COVID-19 test is negative.  We will start Zosyn.  Patient's lab values are overall  reassuring besides thrombocytopenia, which could be secondary to sepsis. [AN]    Clinical Course User Index [AN] Varney Biles, MD   MDM Rules/Calculators/A&P                         65 year old female comes in with chief complaint of altered mental status.       DDx includes: ICH / Stroke ACS Sepsis syndrome due to Infection - UTI/Pneumonia Encephalopathy - toxic/metabolic Electrolyte abnormality Drug overdose DKA Covid Hypercapnia / COPD Hypoxia  65 year old female comes in a chief complaint of altered mental status.  She is not providing meaningful history, therefore I have discussed the case with patient's son and also home health provider.  Patient is noted to have low-grade fever.  Her chart review also indicates positive UTI in the past with ESBL.  Patient's son and the home health nurse both informed me that with UTI she has had confusion in the past.  With her having rigors and acute change in her mental status, suspicion is high for sepsis and bacteremia.  We also considered COVID-19 in the differential diagnosis.  O2 sats are listed as 91%.  There is no respiratory distress, no focal consolidation on lung exam.  COVID-19 test has been sent.  Given the changes, patient will need admission to the hospital.  Final Clinical Impression(s) / ED Diagnoses Final diagnoses:  Disorientation  Rigors  Severe sepsis ([place])    Rx / DC Orders ED Discharge Orders     None  Varney Biles, MD 07/26/20 [phone]

## 2020-07-25 NOTE — Sepsis Progress Note (Signed)
eLink is following this code sepsis

## 2020-07-25 NOTE — ED Notes (Signed)
Dr. Kathrynn Humble made aware of patient symptoms, presentation and LKW time. States that he will call family and be in to assess patient.

## 2020-07-25 NOTE — ED Notes (Signed)
Dr. Kathrynn Humble

## 2020-07-25 NOTE — ED Notes (Signed)
Due to MAS, patient unable to answer questions.

## 2020-07-25 NOTE — H&P (Signed)
History and Physical    [name] XVQ:[id number] DOB: [birth year] DOA: 07/25/2020  PCP: Neale Burly, MD   Patient coming from: Home  I have personally briefly reviewed patient's old medical records in [place]  Chief Complaint: Fever, AMS  HPI: Cassidy Smith is a 65 y.o. female with medical history significant for dementia, SIADH bipolar disorder depression and anxiety, ESBL hx. Patient was brought to the ED via EMS reports of altered mental status started about 4 PM with fever.  Per EMS O2 sats was 75 on room air.  Patient arrived in the ED heavily soiled with foul-smelling urine.  On my evaluation patient is lethargic, but awake, able to answer a few simple questions.  Patient has to home health seems that the care of her 1 from 8-12 and the other after 4pm. EDP called patient's son who lives in [place], but said to call home health. EDP was able to reach home health team that takes care of patient, apparently she was normal at noon, and later she was confused, with increased weakness, not following commands, was not coherent with response with chills, when they checked on her.  Also patient has had history of urinary tract infections with altered mental status, but her symptoms are usually not to this degree. At baseline she is able to follow directions , able to talk.  ED Course: T-max 100.8, heart rate 70s to 80s, respiratory rate 19-23, O2 sats 91 to 95% on room air.  WBC 11.2.  Lactic acid 1.  Chest x-ray unremarkable, UA positive for nitrites.  Zosyn was started in the ED following prior urine cultures showing ESBL.  1 L bolus given.  Blood and Urine cultures obtained hospitalist to admit.  Review of Systems: History limited due to altered mental status  Past Medical History:  Diagnosis Date   Anxiety    Bipolar 1 disorder (HCC)    Chronic hip pain    Chronic kidney disease    uti  currently,  hx bladder spasms   Depression    Headache(784.0)     Hyperthyroidism    Ischemic stroke (HCC)    Neuromuscular disorder (HCC)    shaking of hands    SIADH (syndrome of inappropriate ADH production) ([place]) 04/08/2017    Past Surgical History:  Procedure Laterality Date   COLONOSCOPY WITH PROPOFOL N/A 04/05/2017   Procedure: COLONOSCOPY WITH PROPOFOL;  Surgeon: Rogene Houston, MD;  [facility];  Service: Endoscopy;  Laterality: N/A;   INTRAMEDULLARY (IM) NAIL INTERTROCHANTERIC Right 03/07/2017   Procedure: OPEN TREATMENT INTERNAL FIXATION RIGHT HIP WITH GAMA INTRAMEDULARY NAIL;  Surgeon: Carole Civil, MD;  Location: [facility];  Service: Orthopedics;  Laterality: Right;   MULTIPLE EXTRACTIONS WITH ALVEOLOPLASTY N/A 10/30/2012   Procedure: MULTIPLE EXTRACION 5, 6, 8, 9, 10 ,18, 19, 31 WITH MAXILLARY RIGHT AND LEFT  ALVEOLOPLASTY REDUCE MAXILLARY LEFT TUBEROSITY;  Surgeon: Gae Bon, DDS;  Location: [facility];  Service: Oral Surgery;  Laterality: N/A;   POLYPECTOMY  04/05/2017   Procedure: POLYPECTOMY;  Surgeon: Rogene Houston, MD;  [facility];  Service: Endoscopy;;  recto-sigmoid, rectum     reports that she quit smoking about 3 years ago. Her smoking use included cigarettes. She has a 10.00 pack-year smoking history. She has never used smokeless tobacco. She reports that she does not drink alcohol and does not use drugs.  No Known Allergies  Family History  Problem Relation Age of Onset  Breast cancer Mother    Cancer - Other Mother    Alcoholism Father    Colon cancer Neg Hx    Colon polyps Neg Hx     Prior to Admission medications   Medication Sig Start Date End Date Taking? Authorizing Provider  aspirin EC 81 MG tablet Take 81 mg by mouth daily. Swallow whole.   Yes [provider]  atorvastatin (LIPITOR) 20 MG tablet Take 1 tablet (20 mg total) by mouth daily at 6 PM. 06/11/17  Yes Barton Dubois, MD  benztropine (COGENTIN) 1 MG tablet Take 1 tablet (1 mg total) by mouth 2 (two) times daily.  01/05/18  Yes Johnson, Clanford L, MD  Cholecalciferol (VITAMIN D-3) 125 MCG (5000 UT) TABS Take 5,000 Units by mouth daily.   Yes [provider]  divalproex (DEPAKOTE ER) 500 MG 24 hr tablet Take 2 tablets (1,000 mg total) by mouth at bedtime. Patient taking differently: Take 1,500 mg by mouth at bedtime. 08/16/17  Yes Tat, Shanon Brow, MD  furosemide (LASIX) 20 MG tablet Take 10 mg by mouth daily. 07/06/20  Yes [provider]  gabapentin (NEURONTIN) 100 MG capsule Take 1 capsule (100 mg total) by mouth 3 (three) times daily. Patient taking differently: Take 100-200 mg by mouth See admin instructions. Take one capsule (100 mg) by mouth every morning and two capsules (200 mg) at night 05/13/17  Yes Carole Civil, MD  methimazole (TAPAZOLE) 10 MG tablet Take 10 mg by mouth every other day. 07/06/20  Yes [provider]  MUCINEX 600 MG 12 hr tablet SMARTSIG:1 Tablet(s) By Mouth Every 12 Hours 02/11/20  Yes [provider]  Multiple Vitamin (MULTIVITAMIN WITH MINERALS) TABS tablet Take 1 tablet by mouth daily. Centrum Silver   Yes [provider]  pantoprazole (PROTONIX) 40 MG tablet Take 40 mg by mouth daily.   Yes [provider]  potassium chloride (K-DUR,KLOR-CON) 10 MEQ tablet Take 1 tablet (10 mEq total) by mouth every other day. Patient taking differently: Take 10 mEq by mouth daily. 01/08/18  Yes Johnson, Clanford L, MD  risperiDONE (RISPERDAL) 2 MG tablet Take 2 mg by mouth at bedtime. 06/21/19  Yes [provider]  traZODone (DESYREL) 100 MG tablet Take 200 mg by mouth at bedtime.   Yes [provider]  vitamin B-12 (CYANOCOBALAMIN) 1000 MCG tablet Take 1 tablet (1,000 mcg total) by mouth daily. 06/11/17  Yes Barton Dubois, MD  amLODipine (NORVASC) 2.5 MG tablet Take 1 tablet (2.5 mg total) by mouth daily. Patient not taking: Reported on 07/25/2020 11/09/19 12/09/19  Heath Lark D, DO  cefUROXime (CEFTIN) 500 MG tablet Take  500 mg by mouth 2 (two) times daily. Patient not taking: Reported on 07/25/2020 05/08/20   [provider]  cephALEXin (KEFLEX) 500 MG capsule Take 500 mg by mouth 2 (two) times daily. Patient not taking: Reported on 07/25/2020 06/30/20   [provider]  doxycycline (VIBRAMYCIN) 100 MG capsule Take 100 mg by mouth 2 (two) times daily. Patient not taking: Reported on 07/25/2020 05/08/20   [provider]  Ipratropium-Albuterol (COMBIVENT RESPIMAT) 20-100 MCG/ACT AERS respimat Inhale 1 puff into the lungs every 6 (six) hours as needed for wheezing or shortness of breath. Patient not taking: Reported on 07/25/2020 11/08/19   Heath Lark D, DO  nitrofurantoin, macrocrystal-monohydrate, (MACROBID) 100 MG capsule Take 1 capsule (100 mg total) by mouth 2 (two) times daily. Patient not taking: Reported on 07/25/2020 01/20/20   Lajean Saver, MD  potassium  chloride (KLOR-CON) 10 MEQ tablet Take 10 mEq by mouth daily. Patient not taking: Reported on 07/25/2020 07/06/20   [provider]  risperiDONE (RISPERDAL) 1 MG tablet Take 1 tablet (1 mg total) by mouth at bedtime as needed for up to 15 days (agitation). Patient not taking: Reported on 07/03/2019 01/05/18 01/20/18  Murlean Iba, MD    Physical Exam: Vitals:   07/25/20 1844 07/25/20 1915 07/25/20 1931 07/25/20 2030  BP: (!) 158/70  (!) 166/90 138/85  Pulse: 81 81 82 82  Resp: (!) 21 (!) 23 (!) 22 20  Temp:   98.8 F (37.1 C)   TempSrc:   Oral   SpO2: 91% 93% 94% 93%  Weight:      Height:        Constitutional: Lethargic but calm, Vitals:   07/25/20 1844 07/25/20 1915 07/25/20 1931 07/25/20 2030  BP: (!) 158/70  (!) 166/90 138/85  Pulse: 81 81 82 82  Resp: (!) 21 (!) 23 (!) 22 20  Temp:   98.8 F (37.1 C)   TempSrc:   Oral   SpO2: 91% 93% 94% 93%  Weight:      Height:       Eyes: PERRL, lids and conjunctivae normal ENMT: Mucous membranes are very dry.  Neck: normal, supple, no masses, no  thyromegaly Respiratory: clear to auscultation bilaterally, no wheezing, no crackles. Normal respiratory effort. No accessory muscle use.  Cardiovascular: Regular rate and rhythm, no murmurs / rubs / gallops. No extremity edema. 2+ pedal pulses. No carotid bruits.  Abdomen: no tenderness, no masses palpated. No hepatosplenomegaly. Bowel sounds positive.  Musculoskeletal: no clubbing / cyanosis. No joint deformity upper and lower extremities. Good ROM, no contractures. Normal muscle tone.  Skin: no rashes, lesions, ulcers. No induration Neurologic: No apparent cranial nerve abnormality, she is able to lift bilateral upper and lower extremity, following most directions. Psychiatric: Lethargic, easily drifts off to sleep, but easily arousable, able to tell me her name and knows she is in the hospital, able to correctly tell me her son's name.  But not able to tell me why she is in the hospital  Labs on Admission: I have personally reviewed following labs and imaging studies  CBC: Recent Labs  Lab 07/25/20 1750  WBC 11.2*  NEUTROABS 8.0*  HGB 12.6  HCT 39.5  MCV 94.7  PLT 94*   Basic Metabolic Panel: Recent Labs  Lab 07/25/20 1750  NA 135  K 3.9  CL 97*  CO2 29  GLUCOSE 105*  BUN 16  CREATININE 0.68  CALCIUM 9.3   Liver Function Tests: Recent Labs  Lab 07/25/20 1750  AST 18  ALT 10  ALKPHOS 79  BILITOT 0.5  PROT 7.3  ALBUMIN 3.3*   Coagulation Profile: Recent Labs  Lab 07/25/20 1750  INR 1.0   CBG: Recent Labs  Lab 07/25/20 1744  GLUCAP 121*   Urine analysis:    Component Value Date/Time   COLORURINE YELLOW 07/25/2020 1936   APPEARANCEUR CLEAR 07/25/2020 1936   LABSPEC 1.011 07/25/2020 1936   PHURINE 9.0 (H) 07/25/2020 1936   GLUCOSEU NEGATIVE 07/25/2020 1936   HGBUR NEGATIVE 07/25/2020 1936   BILIRUBINUR NEGATIVE 07/25/2020 1936   KETONESUR NEGATIVE 07/25/2020 1936   PROTEINUR NEGATIVE 07/25/2020 1936   UROBILINOGEN 0.2 05/12/2013 0210   NITRITE  POSITIVE (A) 07/25/2020 1936   LEUKOCYTESUR NEGATIVE 07/25/2020 1936    Radiological Exams on Admission: DG Chest Port 1 View  Result Date: 07/25/2020 CLINICAL DATA:  Possible sepsis, fevers EXAM: PORTABLE CHEST 1 VIEW COMPARISON:  11/05/20 FINDINGS: Cardiac shadow is within normal limits. Aortic calcifications are noted. Previously seen right basilar infiltrate has resolved in the interval. No new focal infiltrate is seen. Skin fold is noted over the left chest. No bony abnormality is noted. IMPRESSION: No acute abnormality seen. Electronically Signed   By: Inez Catalina M.D.   On: 07/25/2020 19:33    EKG: Independently reviewed.  Sinus rhythm rate 83.  QTc 460.  Assessment/Plan Principal Problem:   Sepsis ([place]) Active Problems:   Bipolar disorder ([place])   History of depression   Hx of anxiety disorder   SIADH (syndrome of inappropriate ADH production) (HCC)   Dementia without behavioral disturbance (HCC)   Severe sepsis- temperature 100.8, WBC 11.2, with evidence of endorgan dysfunction of metabolic encephalopathy.  Normal lactic acid 1. Likely urinary source UA with positive nitrites.  History of ESBL sensitive to Zosyn and meropenem.  Chest x-ray unremarkable, -Continue antibiotic coverage with Zosyn -Follow-up urine and blood cultures - 2 L, continue N/s 258NI/DP x 82UMP  Metabolic encephalopathy-likely due to sepsis.  Patient also appears markedly dehydrated - Obtain head CT -Hydrate -Remain n.p.o. for now - Check TSH  Thrombocytopenia platelets 94, baseline over the past year 77-119 -Trend  Bipolar disorder, depression anxiety/dementia  -N.p.o. for now, hold Depakote, benztropine, risperidone,  History of SIADH-sodium 135, at baseline.  Hyperthyroidism- per Med rec, patient has not gotten her methimazole in a week. -Hold methimazole for now while n.p.o. resume when able - Check TSH  DVT prophylaxis: SCDS Code Status: Full code Family Communication: None at  bedside Disposition Plan: ~ > 2 days Consults called: None Admission status: Inpt ,tele I certify that at the point of admission it is my clinical judgment that the patient will require inpatient hospital care spanning beyond 2 midnights from the point of admission due to high intensity of service, high risk for further deterioration and high frequency of surveillance required.    Bethena Roys MD [facility]  07/25/2020, 11:00 PM

## 2020-07-25 NOTE — ED Triage Notes (Signed)
Patient to ED after reportedly developing AMS around 1600 with fever. Per EMS, sats were 75% on room air. Patient tachycardic with fever in ED. Patient heavily soiled on arrival with foul smelling urine.

## 2020-07-25 NOTE — ED Notes (Signed)
Pt slightly more alert, opens eyes spontaneously, able to state name, follow commands.

## 2020-07-26 LAB — BASIC METABOLIC PANEL
Anion gap: 6 (ref 5–15)
BUN: 11 mg/dL (ref 8–23)
CO2: 30 mmol/L (ref 22–32)
Calcium: 9.3 mg/dL (ref 8.9–10.3)
Chloride: 99 mmol/L (ref 98–111)
Creatinine, Ser: 0.55 mg/dL (ref 0.44–1.00)
GFR, Estimated: >60 mL/min (ref >60)
Glucose, Bld: 90 mg/dL (ref 70–99)
Potassium: 3.8 mmol/L (ref 3.5–5.1)
Sodium: 135 mmol/L (ref 135–145)

## 2020-07-26 LAB — CBC
HCT: 34.6 % — ABNORMAL LOW (ref 36.0–46.0)
Hemoglobin: 11.1 g/dL — ABNORMAL LOW (ref 12.0–15.0)
MCH: 30.7 pg (ref 26.0–34.0)
MCHC: 32.1 g/dL (ref 30.0–36.0)
MCV: 95.6 fL (ref 80.0–100.0)
Platelets: 77 10*3/uL — ABNORMAL LOW (ref 150–400)
RBC: 3.62 MIL/uL — ABNORMAL LOW (ref 3.87–5.11)
RDW: 16.1 % — ABNORMAL HIGH (ref 11.5–15.5)
WBC: 10.4 10*3/uL (ref 4.0–10.5)
nRBC: 0 % (ref 0.0–0.2)

## 2020-07-26 MED ORDER — ATORVASTATIN CALCIUM 20 MG PO TABS
20.0000 mg | ORAL_TABLET | Freq: Every day | ORAL | Status: DC
  Administered 2020-07-26 – 2020-07-27 (×2): 20 mg via ORAL
  Filled 2020-07-26 (×2): qty 1

## 2020-07-26 MED ORDER — RISPERIDONE 1 MG PO TABS
2.0000 mg | ORAL_TABLET | Freq: Every day | ORAL | Status: DC
  Administered 2020-07-26 – 2020-07-27 (×2): 2 mg via ORAL
  Filled 2020-07-26 (×2): qty 2

## 2020-07-26 MED ORDER — BENZTROPINE MESYLATE 1 MG PO TABS
1.0000 mg | ORAL_TABLET | Freq: Two times a day (BID) | ORAL | Status: DC
  Administered 2020-07-26 – 2020-07-28 (×5): 1 mg via ORAL
  Filled 2020-07-26 (×5): qty 1

## 2020-07-26 MED ORDER — ASPIRIN EC 81 MG PO TBEC
81.0000 mg | DELAYED_RELEASE_TABLET | Freq: Every day | ORAL | Status: DC
  Administered 2020-07-26 – 2020-07-28 (×3): 81 mg via ORAL
  Filled 2020-07-26 (×3): qty 1

## 2020-07-26 MED ORDER — TRAZODONE HCL 50 MG PO TABS
200.0000 mg | ORAL_TABLET | Freq: Every day | ORAL | Status: DC
  Administered 2020-07-26 – 2020-07-27 (×2): 200 mg via ORAL
  Filled 2020-07-26 (×2): qty 4

## 2020-07-26 MED ORDER — DIVALPROEX SODIUM ER 500 MG PO TB24
1500.0000 mg | ORAL_TABLET | Freq: Every day | ORAL | Status: DC
  Administered 2020-07-26 – 2020-07-27 (×2): 1500 mg via ORAL
  Filled 2020-07-26 (×2): qty 3

## 2020-07-26 MED ORDER — PANTOPRAZOLE SODIUM 40 MG PO TBEC
40.0000 mg | DELAYED_RELEASE_TABLET | Freq: Every day | ORAL | Status: DC
  Administered 2020-07-26 – 2020-07-28 (×3): 40 mg via ORAL
  Filled 2020-07-26 (×3): qty 1

## 2020-07-26 MED ORDER — METHIMAZOLE 5 MG PO TABS
10.0000 mg | ORAL_TABLET | ORAL | Status: DC
  Administered 2020-07-26 – 2020-07-28 (×2): 10 mg via ORAL
  Filled 2020-07-26 (×2): qty 2

## 2020-07-26 NOTE — TOC Initial Note (Signed)
Transition of Care [facility]) - Initial/Assessment Note    Patient Details  Name: Cassidy Smith MRN: [id number] Date of Birth: 07/23/1955  [facility] CM/SW Contact:    Iona Beard, [place] Phone Number: 07/26/2020, 12:22 PM  Clinical Narrative:                 [facility] consulted for SNF placement. CSW spoke with pts son Destaney Sarkis who states that pt has caregivers in the home at all times. He does not feel SNF is needed as her caregivers will be able to assist. He is agreeable to [facility] PT services and did not give a preference in company. CSW gave referral to [facility] with [facility], it was accepted. Pt will need HH PT orders at d/c. TOC to follow.   Expected Discharge Plan: Mobile Barriers to Discharge: Continued Medical Work up   Patient Goals and CMS Choice Patient states their goals for this hospitalization and ongoing recovery are:: Go home with [facility] PT CMS Medicare.gov Compare Post Acute Care list provided to:: Patient Represenative (must comment) Choice offered to / list presented to : Adult Children  Expected Discharge Plan and Services Expected Discharge Plan: [place] In-house Referral: Clinical Social Work Discharge Planning Services: CM Consult Post Acute Care Choice: [place] arrangements for the past 2 months: [place]: PT [place]: [place] ([place]) Date [place]: 07/26/20 Time [place]: 1222 Representative spoke with at [place]: [place] Arrangements/Services Living arrangements for the past 2 months: [place] Lives with:: Siblings Patient language and need for interpreter reviewed:: Yes Do you feel safe going back to the place where you live?: Yes      Need for Family Participation in Patient Care: Yes (Comment) Care giver support system in place?: Yes (comment) Current home services:  [facility] Criminal Activity/Legal Involvement Pertinent to Current Situation/Hospitalization: No - Comment as needed  Activities of Daily Living   ADL Screening (condition at time of admission) Patient's cognitive ability adequate to safely complete daily activities?: No Is the patient deaf or have difficulty hearing?: No Does the patient have difficulty seeing, even when wearing glasses/contacts?: No Does the patient have difficulty concentrating, remembering, or making decisions?: Yes Patient able to express need for assistance with ADLs?: No Does the patient have difficulty dressing or bathing?: Yes Independently performs ADLs?: No Communication: Needs assistance Is this a change from baseline?: Change from baseline, expected to last <3 days Dressing (OT): Needs assistance Is this a change from baseline?: Change from baseline, expected to last <3days Grooming: Needs assistance Is this a change from baseline?: Change from baseline, expected to last <3 days Feeding: Needs assistance Is this a change from baseline?: Change from baseline, expected to last <3 days Bathing: Needs assistance Is this a change from baseline?: Change from baseline, expected to last <3 days Toileting: Needs assistance Is this a change from baseline?: Change from baseline, expected to last <3 days In/Out Bed: Needs assistance Is this a change from baseline?: Change from baseline, expected to last <3 days Walks in Home: Needs assistance Is this a change from baseline?: Change from baseline, expected to last <3 days Does the patient have difficulty walking or climbing stairs?: Yes Weakness of Legs: Both Weakness  of Arms/Hands: Both  Permission Sought/Granted                  Emotional Assessment Appearance:: Appears stated age Attitude/Demeanor/Rapport: Unable to Assess Affect (typically observed): Unable to Assess Orientation: : Oriented to Self, Oriented to Place Alcohol / Substance Use: Not  Applicable Psych Involvement: No (comment)  Admission diagnosis:  Rigors [R68.89] Disorientation [R41.0] Severe sepsis ([place]) [A41.9, R65.20] Patient Active Problem List   Diagnosis Date Noted   Sepsis ([place]) 07/25/2020   Sepsis with acute hypoxic respiratory failure without septic shock ([place])    Dementia without behavioral disturbance ([place])    Palliative care by specialist    Goals of care, counseling/discussion    Pneumonia of right lower lobe due to infectious organism 11/06/2019   Intracerebral hematoma ([place]) 07/04/2019   Closed head injury 07/03/2019   Thrombocytopenia ([place]) 07/03/2019   UTI (urinary tract infection) 01/04/2018   Neuromuscular disorder ([place])    Dysuria 10/30/2017   Fever 08/15/2017   Hypokalemia 08/15/2017   Altered mental state 29/47/6546   Acute metabolic encephalopathy 50/35/4656   Valproic acid toxicity 08/15/2017   Altered mental status    Ischemic stroke ([place])    GERD (gastroesophageal reflux disease) 05/12/2017   GI bleed    SIADH (syndrome of inappropriate ADH production) ([place]) 04/08/2017   Dysplastic rectal polyp    Protein-calorie malnutrition, severe 04/07/2017   Pressure injury of skin 04/04/2017   Anticoagulated 04/03/2017   Stool bloody 04/03/2017   Current every day smoker 04/03/2017   Rectal bleeding    Chronic diarrhea    Hyperthyroidism    S/P ORIF (open reduction internal fixation) fracture right hip IM nail 03/07/17 03/24/2017   Hypomagnesemia 03/07/2017   Fall    Closed intertrochanteric fracture of hip, right, initial encounter ([place]) 03/06/2017   Radial styloid fracture: right 03/06/2017   Anemia 03/06/2017   Orthostatic hypotension 06/07/2015   Lower urinary tract infectious disease 06/07/2015   Rhabdomyolysis 06/07/2015   White matter abnormality on MRI of brain 05/14/2013   History of depression 05/14/2013   Hx of anxiety disorder 05/14/2013   Bipolar I disorder, most recent episode (or current) manic, unspecified  05/14/2013   Bipolar 1 disorder ([place]) 05/14/2013   Hyponatremia 05/12/2013   Bipolar disorder ([place]) 05/12/2013   Lacunar infarct, acute ([place]) 05/12/2013   PCP:  Neale Burly, MD Pharmacy:   [facility], [place] [address] Phone: [phone] Fax: [place], [place] [address] [place] [address] Phone: [phone] Fax: [phone]     Social Determinants of Health (SDOH) Interventions    Readmission Risk Interventions Readmission Risk Prevention Plan 07/26/2020  Transportation Screening Complete  Home Care Screening Complete  Medication Review (RN CM) Complete  Some recent data might be hidden

## 2020-07-26 NOTE — Plan of Care (Signed)
  Problem: Acute Rehab OT Goals (only OT should resolve) Goal: Pt. Will Perform Grooming Flowsheets (Taken 07/26/2020 0959) Pt Will Perform Grooming:  standing  with adaptive equipment  with supervision Goal: Pt. Will Transfer To Toilet [place] (Taken 07/26/2020 0959) Pt Will Transfer to Toilet:  with supervision  stand pivot transfer  ambulating Goal: Pt/Caregiver Will Perform Home Exercise Program Flowsheets (Taken 07/26/2020 0959) Pt/caregiver will Perform Home Exercise Program:  Increased strength  Both right and left upper extremity  With Supervision  Consetta Cosner OT, MOT

## 2020-07-26 NOTE — Evaluation (Signed)
Occupational Therapy Evaluation Patient Details Name: SEAN MACWILLIAMS MRN: [id number] DOB: [birth year] Today's Date: 07/26/2020    History of Present Illness GUERLINE HAPP is a 65 y.o. female with medical history significant for dementia, SIADH bipolar disorder depression and anxiety, ESBL hx.  Patient was brought to the ED via EMS reports of altered mental status started about 4 PM with fever.  Per EMS O2 sats was 75 on room air.  Patient arrived in the ED heavily soiled with foul-smelling urine.   On my evaluation patient is lethargic, but awake, able to answer a few simple questions.  Patient has to home health seems that the care of her 1 from 8-12 and the other after 4pm. EDP called patient's son who lives in [place], but said to call home health. EDP was able to reach home health team that takes care of patient, apparently she was normal at noon, and later she was confused, with increased weakness, not following commands, was not coherent with response with chills, when they checked on her.  Also patient has had history of urinary tract infections with altered mental status, but her symptoms are usually not to this degree.  At baseline she is able to follow directions , able to talk.   Clinical Impression   Pt agreeable to OT/PT co-evaluation. Pt presents with general UE weakness with need for Min A for bed mobility and functional transfers. Pt requires frequent cuing for safety and direction following during mobility. Pt is a poor historian. Home care aide supervisor provided much of home history. Pt home care aide reports there is a gap in SPV when pt is at home. Pt is recommended for continued OT in the hospital and recommended venue below to increase strength, balance, and endurance for safe ADL's.     Follow Up Recommendations  SNF;Supervision/Assistance - 24 hour    Equipment Recommendations  None recommended by OT           Precautions / Restrictions Precautions Precautions:  Fall Required Braces or Orthoses:  (Pt has an ankle brace in room.) Restrictions Weight Bearing Restrictions: No      Mobility Bed Mobility Overal bed mobility: Needs Assistance Bed Mobility: Supine to Sit     Supine to sit: Min assist     General bed mobility comments: Roland Rack cuing to follow directions.    Transfers Overall transfer level: Needs assistance Equipment used: Rolling walker (2 wheeled) Transfers: Sit to/from [facility] Sit to Stand: Min assist Stand pivot transfers: Min assist       General transfer comment: Constant cuing needed to follow directions with use of RW during transfer.    Balance Overall balance assessment: Needs assistance Sitting-balance support: No upper extremity supported;Feet supported Sitting balance-Leahy Scale: Fair (fair to good) Sitting balance - Comments: EOB   Standing balance support: Bilateral upper extremity supported;During functional activity Standing balance-Leahy Scale: Fair (poor to fair) Standing balance comment: using RW                           ADL either performed or assessed with clinical judgement   ADL Overall ADL's : Needs assistance/impaired                     Lower Body Dressing: Supervision/safety;Bed level Lower Body Dressing Details (indicate cue type and reason): donning socks at bed level Toilet Transfer: Minimal assistance;RW;Stand-pivot Toilet Transfer Details (indicate cue type and reason): simulated via  SPT from EOB to chair                 Vision Baseline Vision/History:  (Pt reports she needs galsses to see far away but does not have any.)                  Pertinent Vitals/Pain Pain Assessment: 0-10 Pain Score: 10-Worst pain ever (Pt reports 10/10 but pt did not seem limited by pain at all.) Pain Location: L great toe Pain Descriptors / Indicators: Burning Pain Intervention(s): Monitored during session;Repositioned     Hand Dominance  Right   Extremity/Trunk Assessment Upper Extremity Assessment Upper Extremity Assessment: Generalized weakness   Lower Extremity Assessment Lower Extremity Assessment: Defer to PT evaluation   Cervical / Trunk Assessment Cervical / Trunk Assessment: Normal   Communication Communication Communication: No difficulties   Cognition Arousal/Alertness: Awake/alert Behavior During Therapy: WFL for tasks assessed/performed Overall Cognitive Status: History of cognitive impairments - at baseline                                 General Comments: Pt required constant cuing during mobility.                    Home Living Family/patient expects to be discharged to:: Private residence Living Arrangements: Alone Available Help at Discharge: Family;Personal care attendant (Personal care attendants present from 8am to 12 pm and then 5pm to 9pm. Pt lives with brother but care staff present reported that there is a gap in supervision during the day.) Type of Home: House Home Access: Level entry     Home Layout: Two level;Bed/bath upstairs     Bathroom Shower/Tub: Teacher, early years/pre: Handicapped height     Home Equipment: Environmental consultant - 2 wheels;Shower seat;Grab bars - tub/shower   Additional Comments: Pt poor historian; home care aide supervisor in room to assist with provision of history.      Prior Functioning/Environment Level of Independence: Needs assistance  Gait / Transfers Assistance Needed: assisted for household ambulation using RW ADL's / Homemaking Assistance Needed: assisted by brother and home aides            OT Problem List: Decreased strength;Impaired balance (sitting and/or standing);Decreased cognition      OT Treatment/Interventions: Self-care/ADL training;Therapeutic exercise;DME and/or AE instruction;Therapeutic activities;Patient/family education;Balance training    OT Goals(Current goals can be found in the care plan section)  Acute Rehab OT Goals Patient Stated Goal: return home OT Goal Formulation: With patient Time For Goal Achievement: 08/09/20 Potential to Achieve Goals: Good  OT Frequency: Min 2X/week   Barriers to D/C:            Co-evaluation PT/OT/SLP Co-Evaluation/Treatment: Yes Reason for Co-Treatment: To address functional/ADL transfers   OT goals addressed during session: ADL's and self-care;Strengthening/ROM                       End of Session Equipment Utilized During Treatment: Rolling walker  Activity Tolerance: Patient tolerated treatment well Patient left: in chair;with call bell/phone within reach;with chair alarm set;with family/visitor present  OT Visit Diagnosis: Unsteadiness on feet (R26.81);Muscle weakness (generalized) (M62.81)                Time: 6045-4098 OT Time Calculation (min): 22 min Charges:  OT General Charges $OT Visit: 1 Visit OT Evaluation $OT Eval Low Complexity: 1 Low  Warrick Llera  OT, MOT   Larey Seat 07/26/2020, 9:55 AM

## 2020-07-26 NOTE — Progress Notes (Signed)
Patient removed IV from Right hand, Right wrist is still intact and infusing

## 2020-07-26 NOTE — Progress Notes (Signed)
Patient Cassidy Smith without assist looking for the bathroom out the window. Patient also thought she saw her brother , redirected patient and helped her back to bed, call bell within reach, Notified Dr. Maylene Roes

## 2020-07-26 NOTE — Progress Notes (Signed)
PROGRESS NOTE    Cassidy Smith  JGO:[id number] DOB: February 14, 1955 DOA: 07/25/2020 PCP: Neale Burly, MD     Brief Narrative:  Cassidy Smith is a 65 y.o. female with medical history significant for dementia, SIADH, bipolar disorder, depression and anxiety, ESBL hx. Patient was brought to the ED via EMS reports of altered mental status started about 4 PM with fever.  Per EMS O2 sats was 75 on room air.  Patient arrived in the ED heavily soiled with foul-smelling urine. EDP was able to reach home health team that takes care of patient, apparently she was normal at noon, and later she was confused, with increased weakness, not following commands, was not coherent with response with chills, when they checked on her.  Also patient has had history of urinary tract infections with altered mental status, but her symptoms are usually not to this degree.  Patient was admitted for acute metabolic encephalopathy, UTI.  New events last 24 hours / Subjective: On exam, patient is oriented to self and place only and not to year.  She tells me that she was having urinary incontinence without dysuria.  Called EMS.  This morning without any new complaints.  Assessment & Plan:   Principal Problem:   Sepsis ([place]) Active Problems:   Bipolar disorder ([place])   History of depression   Hx of anxiety disorder   Hyperthyroidism   SIADH (syndrome of inappropriate ADH production) ([place])   Dementia without behavioral disturbance ([place])   Severe sepsis secondary to UTI -Presented with fever 100.8, WBC 11.2, with encephalopathy -Patient with history of ESBL sensitive to Zosyn, Merrem -Continue IV Zosyn -Blood culture, urine culture pending  Acute metabolic encephalopathy with underlying dementia  -Secondary to above -CT head negative -Seems to be back to her baseline  Chronic thrombocytopenia -Stable  Bipolar disorder, depression, anxiety -Resume home Depakote, Cogentin,  risperidone  Hyperthyroidism -Continue methimazole  HLD -Continue lipitor      DVT prophylaxis:  SCDs Start: 07/25/20 2328  Code Status:     Code Status Orders  (From admission, onward)           Start     Ordered   07/25/20 2328  Full code  Continuous        07/25/20 2327           Code Status History     Date Active Date Inactive Code Status Order ID Comments User Context   11/06/2019 2050 11/08/2019 2029 DNR [id number]  Kayleen Memos, DO ED   07/03/2019 1813 07/09/2019 2252 Full Code [id number]  Meyran, Ocie Cornfield, NP ED   01/01/2018 2340 01/05/2018 2352 Full Code [id number]  Phillips Grout, MD ED   08/15/2017 0241 08/17/2017 1637 Full Code [id number]  Reubin Milan, MD Inpatient   06/09/2017 2109 06/11/2017 2201 Full Code [id number]  Heath Lark D, DO ED   04/03/2017 1236 04/17/2017 1913 Full Code [id number]  Murlean Iba, MD ED   03/07/2017 1713 03/14/2017 1934 Full Code [id number]  Carole Civil, MD Inpatient   03/06/2017 1515 03/07/2017 1713 Full Code [id number]  Eugenie Filler, MD Inpatient   03/06/2017 1505 03/06/2017 1515 Full Code [id number]  Eugenie Filler, MD ED   06/07/2015 1843 06/11/2015 1517 Full Code [id number]  Louellen Molder, MD Inpatient   05/14/2013 2207 05/15/2013 1539 Full Code [id number]  Lurena Nida, NP Inpatient   05/12/2013 0420 05/14/2013 2207 Full Code [id number]  Jani Gravel, MD ED   11/13/2010 [phone]  11/13/2010 1751 Full Code [id number]  Lynne Logan, RN ED      Family Communication: None at bedside Disposition Plan:  Status is: Inpatient  Remains inpatient appropriate because:IV treatments appropriate due to intensity of illness or inability to take PO  Dispo: The patient is from: Home              Anticipated d/c is to: SNF              Patient currently is not medically stable to d/c.   Difficult to place patient No     Antimicrobials:  Anti-infectives (From admission, onward)    Start     Dose/Rate Route  Frequency Ordered Stop   07/26/20 0600  piperacillin-tazobactam (ZOSYN) IVPB 3.375 g  Status:  Discontinued        3.375 g 12.5 mL/hr over 240 Minutes Intravenous Every 8 hours 07/25/20 1841 07/25/20 1841   07/26/20 0300  piperacillin-tazobactam (ZOSYN) IVPB 3.375 g        3.375 g 12.5 mL/hr over 240 Minutes Intravenous Every 8 hours 07/25/20 1841     07/25/20 1845  piperacillin-tazobactam (ZOSYN) IVPB 3.375 g        3.375 g 100 mL/hr over 30 Minutes Intravenous  Once 07/25/20 1841 07/25/20 1915        Objective: Vitals:   07/25/20 2030 07/25/20 2300 07/25/20 2337 07/26/20 0331  BP: 138/85 (!) 149/96 (!) 152/105 (!) 147/78  Pulse: 82 62 65 63  Resp: 20 (!) 21 (!) 21 20  Temp:   98.1 F (36.7 C) 98.2 F (36.8 C)  TempSrc:   Oral   SpO2: 93% 95%  98%  Weight:      Height:        Intake/Output Summary (Last 24 hours) at 07/26/2020 1133 Last data filed at 07/26/2020 0900 Gross per 24 hour  Intake 1360 ml  Output --  Net 1360 ml   Filed Weights   07/25/20 1739  Weight: 54.4 kg    Examination:  General exam: Appears calm and comfortable  Respiratory system: Clear to auscultation. Respiratory effort normal. No respiratory distress. No conversational dyspnea.  Cardiovascular system: S1 & S2 heard, RRR. No murmurs. No pedal edema. Gastrointestinal system: Abdomen is nondistended, soft and nontender. Normal bowel sounds heard. Central nervous system: Alert and oriented x2 Extremities: Symmetric in appearance  Skin: No rashes, lesions or ulcers on exposed skin  Psychiatry: Judgement and insight appear poor  Data Reviewed: I have personally reviewed following labs and imaging studies  CBC: Recent Labs  Lab 07/25/20 1750 07/26/20 0416  WBC 11.2* 10.4  NEUTROABS 8.0*  --   HGB 12.6 11.1*  HCT 39.5 34.6*  MCV 94.7 95.6  PLT 94* 77*   Basic Metabolic Panel: Recent Labs  Lab 07/25/20 1750 07/26/20 0416  NA 135 135  K 3.9 3.8  CL 97* 99  CO2 29 30  GLUCOSE 105*  90  BUN 16 11  CREATININE 0.68 0.55  CALCIUM 9.3 9.3   GFR: Estimated Creatinine Clearance: 58.6 mL/min (by C-G formula based on SCr of 0.55 mg/dL). Liver Function Tests: Recent Labs  Lab 07/25/20 1750  AST 18  ALT 10  ALKPHOS 79  BILITOT 0.5  PROT 7.3  ALBUMIN 3.3*   No results for input(s): LIPASE, AMYLASE in the last 168 hours. No results for input(s): AMMONIA in the last 168 hours. Coagulation Profile: Recent Labs  Lab 07/25/20 1750  INR 1.0   Cardiac Enzymes: No results for  input(s): CKTOTAL, CKMB, CKMBINDEX, TROPONINI in the last 168 hours. BNP (last 3 results) No results for input(s): PROBNP in the last 8760 hours. HbA1C: No results for input(s): HGBA1C in the last 72 hours. CBG: Recent Labs  Lab 07/25/20 1744  GLUCAP 121*   Lipid Profile: No results for input(s): CHOL, HDL, LDLCALC, TRIG, CHOLHDL, LDLDIRECT in the last 72 hours. Thyroid Function Tests: Recent Labs    07/25/20 1733  TSH 1.166   Anemia Panel: No results for input(s): VITAMINB12, FOLATE, FERRITIN, TIBC, IRON, RETICCTPCT in the last 72 hours. Sepsis Labs: Recent Labs  Lab 07/25/20 1750 07/25/20 1949  LATICACIDVEN 1.2 1.0    Recent Results (from the past 240 hour(s))  Blood Culture (routine x 2)     Status: None (Preliminary result)   Collection Time: 07/25/20  5:50 PM   Specimen: Right Antecubital; Blood  Result Value Ref Range Status   Specimen Description RIGHT ANTECUBITAL  Final   Special Requests   Final    BOTTLES DRAWN AEROBIC AND ANAEROBIC Blood Culture adequate volume   Culture   Final    NO GROWTH < 12 HOURS Performed at [facility], [address], [place], [place] [address]    Report Status PENDING  Incomplete  Blood Culture (routine x 2)     Status: None (Preliminary result)   Collection Time: 07/25/20  6:41 PM   Specimen: Left Antecubital; Blood  Result Value Ref Range Status   Specimen Description LEFT ANTECUBITAL  Final   Special Requests   Final    BOTTLES  DRAWN AEROBIC AND ANAEROBIC Blood Culture adequate volume   Culture   Final    NO GROWTH < 12 HOURS Performed at [facility], [address] [place],  [address]    Report Status PENDING  Incomplete  Resp Panel by RT-PCR (Flu A&B, Covid) Nasopharyngeal Swab     Status: None   Collection Time: 07/25/20  9:40 PM   Specimen: Nasopharyngeal Swab; Nasopharyngeal(NP) swabs in vial transport medium  Result Value Ref Range Status   SARS Coronavirus 2 by RT PCR NEGATIVE NEGATIVE Final    Comment: (NOTE) SARS-CoV-2 target nucleic acids are NOT DETECTED.  The SARS-CoV-2 RNA is generally detectable in upper respiratory specimens during the acute phase of infection. The lowest concentration of SARS-CoV-2 viral copies this assay can detect is 138 copies/mL. A negative result does not preclude SARS-Cov-2 infection and should not be used as the sole basis for treatment or other patient management decisions. A negative result may occur with  improper specimen collection/handling, submission of specimen other than nasopharyngeal swab, presence of viral mutation(s) within the areas targeted by this assay, and inadequate number of viral copies(<138 copies/mL). A negative result must be combined with clinical observations, patient history, and epidemiological information. The expected result is Negative.  Fact Sheet for Patients:  EntrepreneurPulse.com.au  Fact Sheet for Healthcare Providers:  IncredibleEmployment.be  This test is no t yet approved or cleared by the [place] FDA and  has been authorized for detection and/or diagnosis of SARS-CoV-2 by FDA under an Emergency Use Authorization (EUA). This EUA will remain  in effect (meaning this test can be used) for the duration of the COVID-19 declaration under Section 564(b)(1) of the Act, 21 U.S.C.section 360bbb-3(b)(1), unless the authorization is terminated  or revoked sooner.       Influenza A  by PCR NEGATIVE NEGATIVE Final   Influenza B by PCR NEGATIVE NEGATIVE Final    Comment: (NOTE) The Xpert Xpress  SARS-CoV-2/FLU/RSV plus assay is intended as an aid in the diagnosis of influenza from Nasopharyngeal swab specimens and should not be used as a sole basis for treatment. Nasal washings and aspirates are unacceptable for Xpert Xpress SARS-CoV-2/FLU/RSV testing.  Fact Sheet for Patients: EntrepreneurPulse.com.au  Fact Sheet for Healthcare Providers: IncredibleEmployment.be  This test is not yet approved or cleared by the [place] FDA and has been authorized for detection and/or diagnosis of SARS-CoV-2 by FDA under an Emergency Use Authorization (EUA). This EUA will remain in effect (meaning this test can be used) for the duration of the COVID-19 declaration under Section 564(b)(1) of the Act, 21 U.S.C. section 360bbb-3(b)(1), unless the authorization is terminated or revoked.  Performed at [facility], [address], [place], [place] [address]       Radiology Studies: CT HEAD WO CONTRAST  Result Date: 07/25/2020 CLINICAL DATA:  Altered mental status. EXAM: CT HEAD WITHOUT CONTRAST TECHNIQUE: Contiguous axial images were obtained from the base of the skull through the vertex without intravenous contrast. 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 Similar global parenchymal volume loss with ex vacuo dilatation of ventricular system. 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 No evidence of acute large vascular territory infarction, hemorrhage, hydrocephalus, extra-axial collection or mass lesion/mass effect. Vascular: No hyperdense vessel. Atherosclerotic calcifications of the internal carotid arteries at the skull base. Skull: Normal. Negative for fracture or focal lesion. Sinuses/Orbits: Visualized portions of the paranasal  sinuses and mastoid air cells are predominantly clear. Orbits are unremarkable. Other: None IMPRESSION: 1. No acute intracranial abnormality. 2. 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 3. 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 Electronically Signed   By: Dahlia Bailiff MD   On: 07/25/2020 23:05   DG Chest Port 1 View  Result Date: 07/25/2020 CLINICAL DATA:  Possible sepsis, fevers EXAM: PORTABLE CHEST 1 VIEW COMPARISON:  11/05/20 FINDINGS: Cardiac shadow is within normal limits. Aortic calcifications are noted. Previously seen right basilar infiltrate has resolved in the interval. No new focal infiltrate is seen. Skin fold is noted over the left chest. No bony abnormality is noted. IMPRESSION: No acute abnormality seen. Electronically Signed   By: Inez Catalina M.D.   On: 07/25/2020 19:33      Scheduled Meds:  aspirin EC  81 mg Oral Daily   atorvastatin  20 mg Oral q1800   benztropine  1 mg Oral BID   divalproex  1,500 mg Oral QHS   methimazole  10 mg Oral QODAY   pantoprazole  40 mg Oral Daily   risperiDONE  2 mg Oral QHS   traZODone  200 mg Oral QHS   Continuous Infusions:  lactated ringers 100 mL/hr at 07/26/20 0129   piperacillin-tazobactam (ZOSYN)  IV 3.375 g (07/26/20 0339)     LOS: 1 day      Time spent: 25 minutes   Dessa Phi, DO [facility] 07/26/2020, 11:33 AM   Available via Epic secure chat 7am-7pm After these hours, please refer to coverage provider listed on [email or web address]

## 2020-07-26 NOTE — Evaluation (Addendum)
Physical Therapy Evaluation Patient Details Name: Cassidy Smith MRN: [id number] DOB: 1955/09/28 Today's Date: 07/26/2020   History of Present Illness  Cassidy Smith is a 65 y.o. female with medical history significant for dementia, SIADH bipolar disorder depression and anxiety, ESBL hx.  Patient was brought to the ED via EMS reports of altered mental status started about 4 PM with fever.  Per EMS O2 sats was 75 on room air.  Patient arrived in the ED heavily soiled with foul-smelling urine.   On my evaluation patient is lethargic, but awake, able to answer a few simple questions.  Patient has to home health seems that the care of her 1 from 8-12 and the other after 4pm. EDP called patient's son who lives in [place], but said to call home health. EDP was able to reach home health team that takes care of patient, apparently she was normal at noon, and later she was confused, with increased weakness, not following commands, was not coherent with response with chills, when they checked on her.  Also patient has had history of urinary tract infections with altered mental status, but her symptoms are usually not to this degree.  At baseline she is able to follow directions , able to talk.   Clinical Impression  Patient presents sitting up in bed and agreeable to therapy. Patient demonstrates modified independence for bed mobility required verbal and tactile cues for transfer to sitting up at EOB. Patient demonstrated min assisted for sit to stand with constant verbal cues. Patient demonstrated min assist for ambulation using a RW with verbal and tactile cues for walker use. Patient was left in chair with chair alarm on and nursing staff notified. Patient will benefit from continued physical therapy in hospital and recommended venue below to increase strength, balance, endurance for safe ADLs and gait.     Follow Up Recommendations SNF;Supervision/Assistance - 24 hour;Supervision for  mobility/OOB;Supervision - Intermittent    Equipment Recommendations  None recommended by PT    Recommendations for Other Services       Precautions / Restrictions Precautions Precautions: Fall Required Braces or Orthoses:  (Pt has an ankle brace in room.) Restrictions Weight Bearing Restrictions: No      Mobility  Bed Mobility Overal bed mobility: Needs Assistance Bed Mobility: Supine to Sit     Supine to sit: Min assist     General bed mobility comments: Roland Rack cuing to follow directions. Patient Response: Cooperative  Transfers Overall transfer level: Needs assistance Equipment used: Rolling walker (2 wheeled) Transfers: Sit to/from [facility] Sit to Stand: Min assist Stand pivot transfers: Min assist       General transfer comment: Constant cuing needed to follow directions with use of RW during transfer.  Ambulation/Gait Ambulation/Gait assistance: Min guard;Min assist Gait Distance (Feet): 5 Feet Assistive device: Rolling walker (2 wheeled) Gait Pattern/deviations: Decreased step length - left;Decreased stance time - right;Decreased stride length;Narrow base of support;Trunk flexed Gait velocity: decreased   General Gait Details: slow labored movements, shuffling like gait pattern  Stairs            Wheelchair Mobility    Modified Rankin (Stroke Patients Only)       Balance Overall balance assessment: Needs assistance Sitting-balance support: No upper extremity supported;Feet supported Sitting balance-Leahy Scale: Fair Sitting balance - Comments: EOB   Standing balance support: Bilateral upper extremity supported;During functional activity Standing balance-Leahy Scale: Fair Standing balance comment: using RW  Pertinent Vitals/Pain Pain Assessment: Faces Pain Score: 10-Worst pain ever (Pt reports 10/10 but pt did not seem limited by pain at all.) Faces Pain Scale: Hurts a  little bit Pain Location: L great toe Pain Descriptors / Indicators: Burning Pain Intervention(s): Limited activity within patient's tolerance;Monitored during session;Repositioned    Home Living Family/patient expects to be discharged to:: Private residence Living Arrangements: Alone Available Help at Discharge: Family;Personal care attendant Type of Home: House Home Access: Level entry     Home Layout: Two level;Bed/bath upstairs Home Equipment: Walker - 2 wheels;Shower seat;Grab bars - tub/shower Additional Comments: Pt poor historian; home care aide supervisor in room to assist with provision of history.    Prior Function Level of Independence: Needs assistance   Gait / Transfers Assistance Needed: assisted for household ambulation using RW  ADL's / Homemaking Assistance Needed: assisted by brother and home aides        Hand Dominance   Dominant Hand: Right    Extremity/Trunk Assessment   Upper Extremity Assessment Upper Extremity Assessment: Defer to OT evaluation    Lower Extremity Assessment Lower Extremity Assessment: Generalized weakness    Cervical / Trunk Assessment Cervical / Trunk Assessment: Normal  Communication   Communication: No difficulties  Cognition Arousal/Alertness: Awake/alert Behavior During Therapy: WFL for tasks assessed/performed Overall Cognitive Status: History of cognitive impairments - at baseline                                 General Comments: Pt required constant cuing during mobility.      General Comments      Exercises     Assessment/Plan    PT Assessment Patient needs continued PT services  PT Problem List Decreased strength;Decreased activity tolerance;Decreased balance;Decreased mobility;Decreased coordination;Decreased range of motion;Decreased cognition;Decreased safety awareness       PT Treatment Interventions DME instruction;Gait training;Therapeutic activities;Therapeutic exercise;Functional  mobility training;Balance training;Patient/family education    PT Goals (Current goals can be found in the Care Plan section)  Acute Rehab PT Goals Patient Stated Goal: return home Time For Goal Achievement: 07/28/20 Potential to Achieve Goals: Good    Frequency Min 3X/week   Barriers to discharge        Co-evaluation PT/OT/SLP Co-Evaluation/Treatment: Yes Reason for Co-Treatment: To address functional/ADL transfers PT goals addressed during session: Mobility/safety with mobility OT goals addressed during session: ADL's and self-care;Strengthening/ROM       AM-PAC PT "6 Clicks" Mobility  Outcome Measure Help needed turning from your back to your side while in a flat bed without using bedrails?: None Help needed moving from lying on your back to sitting on the side of a flat bed without using bedrails?: None Help needed moving to and from a bed to a chair (including a wheelchair)?: A Little Help needed standing up from a chair using your arms (e.g., wheelchair or bedside chair)?: A Little Help needed to walk in hospital room?: A Lot Help needed climbing 3-5 steps with a railing? : A Lot 6 Click Score: 18    End of Session   Activity Tolerance: Patient tolerated treatment well Patient left: with call bell/phone within reach;in chair;with chair alarm set;Other (comment) (with director of her health aid service) Nurse Communication: Mobility status PT Visit Diagnosis: Unsteadiness on feet (R26.81);Difficulty in walking, not elsewhere classified (R26.2);Muscle weakness (generalized) (M62.81)    Time: 2355-7322 PT Time Calculation (min) (ACUTE ONLY): 30 min   Charges:   PT  Evaluation $PT Eval Moderate Complexity: 1 Mod PT Treatments $Therapeutic Activity: 23-37 mins       11:36 AM, 07/26/20 Sinclair Ship SPT  11:36 AM, 07/26/20 Lonell Grandchild, MPT Physical Therapist with [facility] [phone] office [phone] mobile phone

## 2020-07-26 NOTE — Plan of Care (Addendum)
  Problem: Acute Rehab PT Goals(only PT should resolve) Goal: Pt Will Go Sit To Supine/Side Outcome: Progressing Flowsheets (Taken 07/26/2020 1132) Pt will go Sit to Supine/Side: Independently Goal: Patient Will Perform Sitting Balance Outcome: Progressing Flowsheets (Taken 07/26/2020 1132) Patient will perform sitting balance: Independently Goal: Patient Will Transfer Sit To/From Stand Outcome: Progressing Flowsheets (Taken 07/26/2020 1132) Patient will transfer sit to/from stand:  with modified independence  with supervision Goal: Pt Will Transfer Bed To Chair/Chair To Bed Outcome: Progressing Flowsheets (Taken 07/26/2020 1132) Pt will Transfer Bed to Chair/Chair to Bed:  with modified independence  with supervision Goal: Pt Will Ambulate Outcome: Progressing Flowsheets (Taken 07/26/2020 1132) Pt will Ambulate:  25 feet  with modified independence  with rolling walker  with supervision    11:32 AM, 07/26/20 Sinclair Ship SPT  11:35 AM, 07/26/20 Lonell Grandchild, MPT Physical Therapist with [facility] [phone] office [phone] mobile phone

## 2020-07-27 LAB — HIV ANTIBODY (ROUTINE TESTING W REFLEX): HIV Screen 4th Generation wRfx: NONREACTIVE

## 2020-07-27 NOTE — Progress Notes (Signed)
PROGRESS NOTE    Cassidy Smith  ZHY:[id number] DOB: 31-May-1955 DOA: 07/25/2020 PCP: Neale Burly, MD     Brief Narrative:  Cassidy Smith is a 65 y.o. female with medical history significant for dementia, SIADH, bipolar disorder, depression and anxiety, ESBL hx. Patient was brought to the ED via EMS reports of altered mental status started about 4 PM with fever.  Per EMS O2 sats was 75 on room air.  Patient arrived in the ED heavily soiled with foul-smelling urine. EDP was able to reach home health team that takes care of patient, apparently she was normal at noon, and later she was confused, with increased weakness, not following commands, was not coherent with response with chills, when they checked on her.  Also patient has had history of urinary tract infections with altered mental status, but her symptoms are usually not to this degree.  Patient was admitted for acute metabolic encephalopathy, UTI.  New events last 24 hours / Subjective: Patient remains pleasantly confused this morning.  She is alert and oriented to self only.  Tells me about her brother.  She does not have any physical complaints today. She is able to tell me that she does not have a spouse, has a son in [place].   Assessment & Plan:   Principal Problem:   Sepsis ([place]) Active Problems:   Bipolar disorder ([place])   History of depression   Hx of anxiety disorder   Hyperthyroidism   SIADH (syndrome of inappropriate ADH production) ([place])   Dementia without behavioral disturbance ([place])   Severe sepsis secondary to UTI -Presented with fever 100.8, WBC 11.2, with encephalopathy -Patient with history of ESBL sensitive to Zosyn, Merrem -Continue IV Zosyn -Blood culture negative to date -Urine culture showing E. coli, sensitivities pending  Acute metabolic encephalopathy with underlying dementia  -Secondary to above -CT head negative -Seems to be back to her baseline  Chronic thrombocytopenia -Stable  Bipolar  disorder, depression, anxiety -Resume home Depakote, Cogentin, risperidone  Hyperthyroidism -Continue methimazole  HLD -Continue lipitor      DVT prophylaxis:  SCDs Start: 07/25/20 2328  Code Status:     Code Status Orders  (From admission, onward)           Start     Ordered   07/25/20 2328  Full code  Continuous        07/25/20 2327           Code Status History     Date Active Date Inactive Code Status Order ID Comments User Context   11/06/2019 2050 11/08/2019 2029 DNR [id number]  Kayleen Memos, DO ED   07/03/2019 1813 07/09/2019 2252 Full Code [id number]  Meyran, Ocie Cornfield, NP ED   01/01/2018 2340 01/05/2018 2352 Full Code [id number]  Phillips Grout, MD ED   08/15/2017 0241 08/17/2017 1637 Full Code [id number]  Reubin Milan, MD Inpatient   06/09/2017 2109 06/11/2017 2201 Full Code [id number]  Heath Lark D, DO ED   04/03/2017 1236 04/17/2017 1913 Full Code [id number]  Murlean Iba, MD ED   03/07/2017 1713 03/14/2017 1934 Full Code [id number]  Carole Civil, MD Inpatient   03/06/2017 1515 03/07/2017 1713 Full Code [id number]  Eugenie Filler, MD Inpatient   03/06/2017 1505 03/06/2017 1515 Full Code [id number]  Eugenie Filler, MD ED   06/07/2015 1843 06/11/2015 1517 Full Code [id number]  Louellen Molder, MD Inpatient   05/14/2013 2207 05/15/2013 1539 Full Code [id number]  Lurena Nida,  NP Inpatient   05/12/2013 0420 05/14/2013 2207 Full Code [id number]  Jani Gravel, MD ED   11/13/2010 [phone] 11/13/2010 1751 Full Code [id number]  Lynne Logan, RN ED      Family Communication: None at bedside Disposition Plan:  Status is: Inpatient  Remains inpatient appropriate because:IV treatments appropriate due to intensity of illness or inability to take PO  Dispo: The patient is from: Home              Anticipated d/c is to: Home. Son declined SNF placement.               Patient currently is not medically stable to d/c.   Difficult to place patient  No     Antimicrobials:  Anti-infectives (From admission, onward)    Start     Dose/Rate Route Frequency Ordered Stop   07/26/20 0600  piperacillin-tazobactam (ZOSYN) IVPB 3.375 g  Status:  Discontinued        3.375 g 12.5 mL/hr over 240 Minutes Intravenous Every 8 hours 07/25/20 1841 07/25/20 1841   07/26/20 0300  piperacillin-tazobactam (ZOSYN) IVPB 3.375 g        3.375 g 12.5 mL/hr over 240 Minutes Intravenous Every 8 hours 07/25/20 1841     07/25/20 1845  piperacillin-tazobactam (ZOSYN) IVPB 3.375 g        3.375 g 100 mL/hr over 30 Minutes Intravenous  Once 07/25/20 1841 07/25/20 1915        Objective: Vitals:   07/25/20 2337 07/26/20 0331 07/26/20 1350 07/27/20 0408  BP: (!) 152/105 (!) 147/78 138/81 129/77  Pulse: 65 63 89 72  Resp: (!) 21 20 18 19   Temp: 98.1 F (36.7 C) 98.2 F (36.8 C) 98.1 F (36.7 C) (!) 97.5 F (36.4 C)  TempSrc: Oral   Oral  SpO2:  98% 97% 94%  Weight:      Height:        Intake/Output Summary (Last 24 hours) at 07/27/2020 1238 Last data filed at 07/27/2020 1000 Gross per 24 hour  Intake 1702.65 ml  Output --  Net 1702.65 ml    Filed Weights   07/25/20 1739  Weight: 54.4 kg   Examination: General exam: Appears calm and comfortable  Respiratory system: Clear to auscultation. Respiratory effort normal. Cardiovascular system: S1 & S2 heard, RRR. No pedal edema. Gastrointestinal system: Abdomen is nondistended, soft and nontender. Normal bowel sounds heard. Central nervous system: Alert and oriented to self only Extremities: Symmetric in appearance bilaterally  Skin: No rashes, lesions or ulcers on exposed skin  Psychiatry: Pleasantly confused, dementia  Data Reviewed: I have personally reviewed following labs and imaging studies  CBC: Recent Labs  Lab 07/25/20 1750 07/26/20 0416  WBC 11.2* 10.4  NEUTROABS 8.0*  --   HGB 12.6 11.1*  HCT 39.5 34.6*  MCV 94.7 95.6  PLT 94* 77*    Basic Metabolic Panel: Recent Labs   Lab 07/25/20 1750 07/26/20 0416  NA 135 135  K 3.9 3.8  CL 97* 99  CO2 29 30  GLUCOSE 105* 90  BUN 16 11  CREATININE 0.68 0.55  CALCIUM 9.3 9.3    GFR: Estimated Creatinine Clearance: 58.6 mL/min (by C-G formula based on SCr of 0.55 mg/dL). Liver Function Tests: Recent Labs  Lab 07/25/20 1750  AST 18  ALT 10  ALKPHOS 79  BILITOT 0.5  PROT 7.3  ALBUMIN 3.3*    No results for input(s): LIPASE, AMYLASE in the last 168 hours. No results for input(s):  AMMONIA in the last 168 hours. Coagulation Profile: Recent Labs  Lab 07/25/20 1750  INR 1.0    Cardiac Enzymes: No results for input(s): CKTOTAL, CKMB, CKMBINDEX, TROPONINI in the last 168 hours. BNP (last 3 results) No results for input(s): PROBNP in the last 8760 hours. HbA1C: No results for input(s): HGBA1C in the last 72 hours. CBG: Recent Labs  Lab 07/25/20 1744  GLUCAP 121*    Lipid Profile: No results for input(s): CHOL, HDL, LDLCALC, TRIG, CHOLHDL, LDLDIRECT in the last 72 hours. Thyroid Function Tests: Recent Labs    07/25/20 1733  TSH 1.166    Anemia Panel: No results for input(s): VITAMINB12, FOLATE, FERRITIN, TIBC, IRON, RETICCTPCT in the last 72 hours. Sepsis Labs: Recent Labs  Lab 07/25/20 1750 07/25/20 1949  LATICACIDVEN 1.2 1.0     Recent Results (from the past 240 hour(s))  Blood Culture (routine x 2)     Status: None (Preliminary result)   Collection Time: 07/25/20  5:50 PM   Specimen: Right Antecubital; Blood  Result Value Ref Range Status   Specimen Description RIGHT ANTECUBITAL  Final   Special Requests   Final    BOTTLES DRAWN AEROBIC AND ANAEROBIC Blood Culture adequate volume   Culture   Final    NO GROWTH 2 DAYS Performed at [facility], [address], [place], [place] [address]    Report Status PENDING  Incomplete  Blood Culture (routine x 2)     Status: None (Preliminary result)   Collection Time: 07/25/20  6:41 PM   Specimen: Left Antecubital; Blood  Result  Value Ref Range Status   Specimen Description LEFT ANTECUBITAL  Final   Special Requests   Final    BOTTLES DRAWN AEROBIC AND ANAEROBIC Blood Culture adequate volume   Culture   Final    NO GROWTH 2 DAYS Performed at [facility], [address], [place], [place] [address]    Report Status PENDING  Incomplete  Urine culture     Status: Abnormal (Preliminary result)   Collection Time: 07/25/20  9:36 PM   Specimen: In/Out Cath Urine  Result Value Ref Range Status   Specimen Description   Final    IN/OUT CATH URINE Performed at [facility], [address] [place], [place] [address]    Special Requests   Final    NONE Performed at [facility], [address] [place], [place] [address]    Culture >=100,000 COLONIES/mL ESCHERICHIA COLI (A)  Final   Report Status PENDING  Incomplete  Resp Panel by RT-PCR (Flu A&B, Covid) Nasopharyngeal Swab     Status: None   Collection Time: 07/25/20  9:40 PM   Specimen: Nasopharyngeal Swab; Nasopharyngeal(NP) swabs in vial transport medium  Result Value Ref Range Status   SARS Coronavirus 2 by RT PCR NEGATIVE NEGATIVE Final    Comment: (NOTE) SARS-CoV-2 target nucleic acids are NOT DETECTED.  The SARS-CoV-2 RNA is generally detectable in upper respiratory specimens during the acute phase of infection. The lowest concentration of SARS-CoV-2 viral copies this assay can detect is 138 copies/mL. A negative result does not preclude SARS-Cov-2 infection and should not be used as the sole basis for treatment or other patient management decisions. A negative result may occur with  improper specimen collection/handling, submission of specimen other than nasopharyngeal swab, presence of viral mutation(s) within the areas targeted by this assay, and inadequate number of viral copies(<138 copies/mL). A negative result must be combined with clinical observations, patient history, and epidemiological information. The  expected result is Negative.  Fact Sheet  for Patients:  EntrepreneurPulse.com.au  Fact Sheet for Healthcare Providers:  IncredibleEmployment.be  This test is no t yet approved or cleared by the [place] FDA and  has been authorized for detection and/or diagnosis of SARS-CoV-2 by FDA under an Emergency Use Authorization (EUA). This EUA will remain  in effect (meaning this test can be used) for the duration of the COVID-19 declaration under Section 564(b)(1) of the Act, 21 U.S.C.section 360bbb-3(b)(1), unless the authorization is terminated  or revoked sooner.       Influenza A by PCR NEGATIVE NEGATIVE Final   Influenza B by PCR NEGATIVE NEGATIVE Final    Comment: (NOTE) The Xpert Xpress SARS-CoV-2/FLU/RSV plus assay is intended as an aid in the diagnosis of influenza from Nasopharyngeal swab specimens and should not be used as a sole basis for treatment. Nasal washings and aspirates are unacceptable for Xpert Xpress SARS-CoV-2/FLU/RSV testing.  Fact Sheet for Patients: EntrepreneurPulse.com.au  Fact Sheet for Healthcare Providers: IncredibleEmployment.be  This test is not yet approved or cleared by the [place] FDA and has been authorized for detection and/or diagnosis of SARS-CoV-2 by FDA under an Emergency Use Authorization (EUA). This EUA will remain in effect (meaning this test can be used) for the duration of the COVID-19 declaration under Section 564(b)(1) of the Act, 21 U.S.C. section 360bbb-3(b)(1), unless the authorization is terminated or revoked.  Performed at [facility], [address] [place], [place] [address]        Radiology Studies: CT HEAD WO CONTRAST  Result Date: 07/25/2020 CLINICAL DATA:  Altered mental status. EXAM: CT HEAD WITHOUT CONTRAST TECHNIQUE: Contiguous axial images were obtained from the base of the skull through the vertex without intravenous contrast. COMPARISON:  January 19, 2020 FINDINGS: Brain:  Encephalomalacia in the right temporal lobe at the site of prior hemorrhage. Similar global parenchymal volume loss with ex vacuo dilatation of ventricular system. 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 No evidence of acute large vascular territory infarction, hemorrhage, hydrocephalus, extra-axial collection or mass lesion/mass effect. Vascular: No hyperdense vessel. Atherosclerotic calcifications of the internal carotid arteries at the skull base. Skull: Normal. Negative for fracture or focal lesion. Sinuses/Orbits: Visualized portions of the paranasal sinuses and mastoid air cells are predominantly clear. Orbits are unremarkable. Other: None IMPRESSION: 1. No acute intracranial abnormality. 2. 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 3. 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 Electronically Signed   By: Dahlia Bailiff MD   On: 07/25/2020 23:05   DG Chest Port 1 View  Result Date: 07/25/2020 CLINICAL DATA:  Possible sepsis, fevers EXAM: PORTABLE CHEST 1 VIEW COMPARISON:  11/05/20 FINDINGS: Cardiac shadow is within normal limits. Aortic calcifications are noted. Previously seen right basilar infiltrate has resolved in the interval. No new focal infiltrate is seen. Skin fold is noted over the left chest. No bony abnormality is noted. IMPRESSION: No acute abnormality seen. Electronically Signed   By: Inez Catalina M.D.   On: 07/25/2020 19:33      Scheduled Meds:  aspirin EC  81 mg Oral Daily   atorvastatin  20 mg Oral q1800   benztropine  1 mg Oral BID   divalproex  1,500 mg Oral QHS   methimazole  10 mg Oral QODAY   pantoprazole  40 mg Oral Daily   risperiDONE  2 mg Oral QHS   traZODone  200 mg Oral QHS   Continuous Infusions:  piperacillin-tazobactam (ZOSYN)  IV 3.375 g (07/27/20 1150)     LOS: 2 days      Time spent: 25 minutes   Dessa Phi,  DO [facility] 07/27/2020, 12:38 PM   Available via Epic secure chat 7am-7pm After these hours, please refer to coverage provider listed on [email or web address]

## 2020-07-28 LAB — URINE CULTURE: Culture: >=100000 — AB

## 2020-07-28 MED ORDER — FOSFOMYCIN TROMETHAMINE 3 G PO PACK
3.0000 g | PACK | Freq: Once | ORAL | Status: AC
  Administered 2020-07-28: 3 g via ORAL
  Filled 2020-07-28: qty 3

## 2020-07-28 NOTE — Plan of Care (Signed)
  Problem: Education: Goal: Knowledge of General Education information will improve Description: Including pain rating scale, medication(s)/side effects and non-pharmacologic comfort measures 07/28/2020 1000 by Mellody Drown, LPN Outcome: Adequate for Discharge 07/28/2020 1000 by Mellody Drown, LPN Outcome: Progressing   Problem: Health Behavior/Discharge Planning: Goal: Ability to manage health-related needs will improve 07/28/2020 1000 by Mellody Drown, LPN Outcome: Adequate for Discharge 07/28/2020 1000 by Mellody Drown, LPN Outcome: Progressing   Problem: Clinical Measurements: Goal: Ability to maintain clinical measurements within normal limits will improve 07/28/2020 1000 by Mellody Drown, LPN Outcome: Adequate for Discharge 07/28/2020 1000 by Mellody Drown, LPN Outcome: Progressing Goal: Will remain free from infection 07/28/2020 1000 by Mellody Drown, LPN Outcome: Adequate for Discharge 07/28/2020 1000 by Mellody Drown, LPN Outcome: Progressing Goal: Diagnostic test results will improve 07/28/2020 1000 by Mellody Drown, LPN Outcome: Adequate for Discharge 07/28/2020 1000 by Mellody Drown, LPN Outcome: Progressing Goal: Respiratory complications will improve 07/28/2020 1000 by Mellody Drown, LPN Outcome: Adequate for Discharge 07/28/2020 1000 by Mellody Drown, LPN Outcome: Progressing Goal: Cardiovascular complication will be avoided 07/28/2020 1000 by Mellody Drown, LPN Outcome: Adequate for Discharge 07/28/2020 1000 by Mellody Drown, LPN Outcome: Progressing   Problem: Activity: Goal: Risk for activity intolerance will decrease 07/28/2020 1000 by Mellody Drown, LPN Outcome: Adequate for Discharge 07/28/2020 1000 by Mellody Drown, LPN Outcome: Progressing   Problem: Nutrition: Goal: Adequate nutrition will be maintained 07/28/2020 1000 by Mellody Drown, LPN Outcome: Adequate for Discharge 07/28/2020 1000 by  Mellody Drown, LPN Outcome: Progressing   Problem: Coping: Goal: Level of anxiety will decrease 07/28/2020 1000 by Mellody Drown, LPN Outcome: Adequate for Discharge 07/28/2020 1000 by Mellody Drown, LPN Outcome: Progressing   Problem: Elimination: Goal: Will not experience complications related to bowel motility 07/28/2020 1000 by Mellody Drown, LPN Outcome: Adequate for Discharge 07/28/2020 1000 by Mellody Drown, LPN Outcome: Progressing Goal: Will not experience complications related to urinary retention 07/28/2020 1000 by Mellody Drown, LPN Outcome: Adequate for Discharge 07/28/2020 1000 by Mellody Drown, LPN Outcome: Progressing   Problem: Pain Managment: Goal: General experience of comfort will improve 07/28/2020 1000 by Mellody Drown, LPN Outcome: Adequate for Discharge 07/28/2020 1000 by Mellody Drown, LPN Outcome: Progressing   Problem: Safety: Goal: Ability to remain free from injury will improve 07/28/2020 1000 by Mellody Drown, LPN Outcome: Adequate for Discharge 07/28/2020 1000 by Mellody Drown, LPN Outcome: Progressing   Problem: Skin Integrity: Goal: Risk for impaired skin integrity will decrease 07/28/2020 1000 by Mellody Drown, LPN Outcome: Adequate for Discharge 07/28/2020 1000 by Mellody Drown, LPN Outcome: Progressing

## 2020-07-28 NOTE — Discharge Summary (Addendum)
Physician Discharge Summary  LASTACIA SOLUM VOH:[id number] DOB: 02/26/1955 DOA: 07/25/2020  PCP: Neale Burly, MD  Admit date: 07/25/2020 Discharge date: 07/28/2020  Admitted From: home Disposition:  home with home health   Recommendations for Outpatient Follow-up:  Follow up with PCP in 1 week  Discharge Condition: Stable, improved CODE STATUS: Full  Diet recommendation: Heart healthy   Brief/Interim Summary: Cassidy Smith is a 65 y.o. female with medical history significant for dementia, SIADH, bipolar disorder, depression and anxiety, ESBL hx. Patient was brought to the ED via EMS reports of altered mental status started about 4 PM with fever.  Per EMS O2 sats was 75 on room air.  Patient arrived in the ED heavily soiled with foul-smelling urine. EDP was able to reach home health team that takes care of patient, apparently she was normal at noon, and later she was confused, with increased weakness, not following commands, was not coherent with response with chills, when they checked on her.  Also patient has had history of urinary tract infections with altered mental status, but her symptoms are usually not to this degree.  Patient was admitted for acute metabolic encephalopathy, UTI.  Urine culture showed ESBL E. coli.  Patient was initially treated with IV Zosyn until urine culture was available, transition to 1 dose of fosfomycin.  Discussed with pharmacy.  Patient remained pleasantly confused at her baseline dementia level.  Son declined SNF placement.  Discharge Diagnoses:  Principal Problem:   Sepsis ([place]) Active Problems:   Bipolar disorder ([place])   History of depression   Hx of anxiety disorder   Hyperthyroidism   SIADH (syndrome of inappropriate ADH production) (HCC)   Dementia without behavioral disturbance (HCC)   Severe sepsis secondary to ESBL E Coli UTI -Presented with fever 100.8, WBC 11.2, with encephalopathy -Patient with history of ESBL sensitive to Zosyn,  Merrem -IV Zosyn --> 1 dose fosfomycin prior to discharge  -Blood culture negative to date   Acute metabolic encephalopathy with underlying dementia  -Secondary to above -CT head negative -Seems to be back to her baseline   Chronic thrombocytopenia -Stable   Bipolar disorder, depression, anxiety -Continue Depakote, Cogentin, risperidone   Hyperthyroidism -Continue methimazole   HLD -Continue lipitor  Discharge Instructions  Discharge Instructions     Call MD for:  difficulty breathing, headache or visual disturbances   Complete by: As directed    Call MD for:  extreme fatigue   Complete by: As directed    Call MD for:  persistant dizziness or light-headedness   Complete by: As directed    Call MD for:  persistant nausea and vomiting   Complete by: As directed    Call MD for:  severe uncontrolled pain   Complete by: As directed    Call MD for:  temperature >100.4   Complete by: As directed    Discharge instructions   Complete by: As directed    You were cared for by a hospitalist during your hospital stay. If you have any questions about your discharge medications or the care you received while you were in the hospital after you are discharged, you can call the unit and ask to speak with the hospitalist on call if the hospitalist that took care of you is not available. Once you are discharged, your primary care physician will handle any further medical issues. Please note that NO REFILLS for any discharge medications will be authorized once you are discharged, as it is imperative that you  return to your primary care physician (or establish a relationship with a primary care physician if you do not have one) for your aftercare needs so that they can reassess your need for medications and monitor your lab values.   Increase activity slowly   Complete by: As directed       Allergies as of 07/28/2020   No Known Allergies      Medication List     STOP taking these  medications    amLODipine 2.5 MG tablet Commonly known as: NORVASC   cefUROXime 500 MG tablet Commonly known as: CEFTIN   cephALEXin 500 MG capsule Commonly known as: KEFLEX   Combivent Respimat 20-100 MCG/ACT Aers respimat Generic drug: Ipratropium-Albuterol   doxycycline 100 MG capsule Commonly known as: VIBRAMYCIN   nitrofurantoin (macrocrystal-monohydrate) 100 MG capsule Commonly known as: MACROBID   potassium chloride 10 MEQ tablet Commonly known as: KLOR-CON       TAKE these medications    aspirin EC 81 MG tablet Take 81 mg by mouth daily. Swallow whole.   atorvastatin 20 MG tablet Commonly known as: LIPITOR Take 1 tablet (20 mg total) by mouth daily at 6 PM.   benztropine 1 MG tablet Commonly known as: COGENTIN Take 1 tablet (1 mg total) by mouth 2 (two) times daily.   divalproex 500 MG 24 hr tablet Commonly known as: DEPAKOTE ER Take 2 tablets (1,000 mg total) by mouth at bedtime. What changed: how much to take   furosemide 20 MG tablet Commonly known as: LASIX Take 10 mg by mouth daily.   gabapentin 100 MG capsule Commonly known as: NEURONTIN Take 1 capsule (100 mg total) by mouth 3 (three) times daily. What changed:  how much to take when to take this additional instructions   methimazole 10 MG tablet Commonly known as: TAPAZOLE Take 10 mg by mouth every other day.   Mucinex 600 MG 12 hr tablet Generic drug: guaiFENesin SMARTSIG:1 Tablet(s) By Mouth Every 12 Hours   multivitamin with minerals Tabs tablet Take 1 tablet by mouth daily. Centrum Silver   pantoprazole 40 MG tablet Commonly known as: PROTONIX Take 40 mg by mouth daily.   potassium chloride 10 MEQ tablet Commonly known as: KLOR-CON Take 1 tablet (10 mEq total) by mouth every other day. What changed: when to take this   risperiDONE 2 MG tablet Commonly known as: RISPERDAL Take 2 mg by mouth at bedtime. What changed: Another medication with the same name was removed.  Continue taking this medication, and follow the directions you see here.   traZODone 100 MG tablet Commonly known as: DESYREL Take 200 mg by mouth at bedtime.   vitamin B-12 1000 MCG tablet Commonly known as: CYANOCOBALAMIN Take 1 tablet (1,000 mcg total) by mouth daily.   Vitamin D-3 125 MCG (5000 UT) Tabs Take 5,000 Units by mouth daily.        Follow-up Information     Neale Burly, MD. Schedule an appointment as soon as possible for a visit in 1 week(s).   Specialty: Internal Medicine Why: As needed, If symptoms worsen Contact information: [place] [phone]                No Known Allergies  Consultations: None    Procedures/Studies: DG Ankle Complete Right  Result Date: 07/22/2020 CLINICAL DATA:  Patient fell this past Thursday with persistent right foot and ankle pain. EXAM: RIGHT ANKLE - COMPLETE 3+ VIEW COMPARISON:  Right  foot radiographs-earlier same day FINDINGS: Apparent diffuse soft tissue swelling about the imaged lower leg, ankle and hindfoot. No associated fracture or dislocation. Joint spaces appear preserved. The ankle mortise appears preserved. Nodular structure posterior to the calcaneus may be dermal in etiology. No radiopaque foreign body. No plantar calcaneal spur. IMPRESSION: Specific diffuse soft tissue swelling about the imaged lower leg, ankle and hindfoot, without associated fracture or dislocation. Electronically Signed   By: Sandi Mariscal M.D.   On: 07/22/2020 11:52   CT HEAD WO CONTRAST  Result Date: 07/25/2020 CLINICAL DATA:  Altered mental status. EXAM: CT HEAD WITHOUT CONTRAST TECHNIQUE: Contiguous axial images were obtained from the base of the skull through the vertex without intravenous contrast. 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 Similar global parenchymal volume loss with ex vacuo dilatation of ventricular  system. 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 No evidence of acute large vascular territory infarction, hemorrhage, hydrocephalus, extra-axial collection or mass lesion/mass effect. Vascular: No hyperdense vessel. Atherosclerotic calcifications of the internal carotid arteries at the skull base. Skull: Normal. Negative for fracture or focal lesion. Sinuses/Orbits: Visualized portions of the paranasal sinuses and mastoid air cells are predominantly clear. Orbits are unremarkable. Other: None IMPRESSION: 1. No acute intracranial abnormality. 2. 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 3. 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 Electronically Signed   By: Dahlia Bailiff MD   On: 07/25/2020 23:05   DG Chest Port 1 View  Result Date: 07/25/2020 CLINICAL DATA:  Possible sepsis, fevers EXAM: PORTABLE CHEST 1 VIEW COMPARISON:  11/05/20 FINDINGS: Cardiac shadow is within normal limits. Aortic calcifications are noted. Previously seen right basilar infiltrate has resolved in the interval. No new focal infiltrate is seen. Skin fold is noted over the left chest. No bony abnormality is noted. IMPRESSION: No acute abnormality seen. Electronically Signed   By: Inez Catalina M.D.   On: 07/25/2020 19:33   DG Foot Complete Right  Result Date: 07/22/2020 CLINICAL DATA:  Patient fell this past Thursday with persistent right foot and ankle pain. EXAM: RIGHT FOOT COMPLETE - 3+ VIEW COMPARISON:  Right ankle radiographs-earlier same day FINDINGS: Diffuse soft tissue swelling about the foot and ankle. No associated fracture or dislocation. Mild hallux valgus deformity. Joint spaces are preserved. No erosions. No plantar calcaneal spur. Nodular opacity posterior to the calcaneus may be dermal in etiology. IMPRESSION: 1. Nonspecific diffuse soft tissue swelling about the ankle without associated  fracture or dislocation. 2. Mild hallux valgus deformity without associated degenerative change. Electronically Signed   By: Sandi Mariscal M.D.   On: 07/22/2020 11:54       Discharge Exam: Vitals:   07/27/20 2046 07/28/20 0557  BP: 140/73 (!) 119/93  Pulse: 70 66  Resp: 20 19  Temp: 98.1 F (36.7 C) 98.1 F (36.7 C)  SpO2: 95% 98%     General: Pt is alert, awake, not in acute distress Cardiovascular: RRR, S1/S2 +, no edema Respiratory: CTA bilaterally, no wheezing, no rhonchi, no respiratory distress, no conversational dyspnea  Abdominal: Soft, NT, ND, bowel sounds + Extremities: no edema, no cyanosis Psych: Normal mood and affect, poor judgement and insight in setting of underlying dementia     The results of significant diagnostics from this hospitalization (including imaging, microbiology, ancillary and laboratory) are listed below for reference.  Microbiology: Recent Results (from the past 240 hour(s))  Blood Culture (routine x 2)     Status: None (Preliminary result)   Collection Time: 07/25/20  5:50 PM   Specimen: Right Antecubital; Blood  Result Value Ref Range Status   Specimen Description RIGHT ANTECUBITAL  Final   Special Requests   Final    BOTTLES DRAWN AEROBIC AND ANAEROBIC Blood Culture adequate volume   Culture   Final    NO GROWTH 2 DAYS Performed at [facility], [address], [place], [place] [address]    Report Status PENDING  Incomplete  Blood Culture (routine x 2)     Status: None (Preliminary result)   Collection Time: 07/25/20  6:41 PM   Specimen: Left Antecubital; Blood  Result Value Ref Range Status   Specimen Description LEFT ANTECUBITAL  Final   Special Requests   Final    BOTTLES DRAWN AEROBIC AND ANAEROBIC Blood Culture adequate volume   Culture   Final    NO GROWTH 2 DAYS Performed at [facility], [address], [place], [place] [address]    Report Status PENDING  Incomplete  Urine culture     Status: Abnormal   Collection Time:  07/25/20  9:36 PM   Specimen: In/Out Cath Urine  Result Value Ref Range Status   Specimen Description   Final    IN/OUT CATH URINE Performed at [facility], [address], [place], [place] [address]    Special Requests   Final    NONE Performed at [facility], [address] [place], [place] [address]    Culture (A)  Final    >=100,000 COLONIES/mL ESCHERICHIA COLI Confirmed Extended Spectrum Beta-Lactamase Producer (ESBL).  In bloodstream infections from ESBL organisms, carbapenems are preferred over piperacillin/tazobactam. They are shown to have a lower risk of mortality.    Report Status 07/28/2020 FINAL  Final   Organism ID, Bacteria ESCHERICHIA COLI (A)  Final      Susceptibility   Escherichia coli - MIC*    AMPICILLIN >=32 RESISTANT Resistant     CEFAZOLIN >=64 RESISTANT Resistant     CEFEPIME >=32 RESISTANT Resistant     CEFTRIAXONE >=64 RESISTANT Resistant     CIPROFLOXACIN >=4 RESISTANT Resistant     GENTAMICIN <=1 SENSITIVE Sensitive     IMIPENEM <=0.25 SENSITIVE Sensitive     NITROFURANTOIN <=16 SENSITIVE Sensitive     TRIMETH/SULFA >=320 RESISTANT Resistant     AMPICILLIN/SULBACTAM >=32 RESISTANT Resistant     PIP/TAZO 8 SENSITIVE Sensitive     * >=100,000 COLONIES/mL ESCHERICHIA COLI  Resp Panel by RT-PCR (Flu A&B, Covid) Nasopharyngeal Swab     Status: None   Collection Time: 07/25/20  9:40 PM   Specimen: Nasopharyngeal Swab; Nasopharyngeal(NP) swabs in vial transport medium  Result Value Ref Range Status   SARS Coronavirus 2 by RT PCR NEGATIVE NEGATIVE Final    Comment: (NOTE) SARS-CoV-2 target nucleic acids are NOT DETECTED.  The SARS-CoV-2 RNA is generally detectable in upper respiratory specimens during the acute phase of infection. The lowest concentration of SARS-CoV-2 viral copies this assay can detect is 138 copies/mL. A negative result does not preclude SARS-Cov-2 infection and should not be used as the sole basis for treatment or other patient  management decisions. A negative result may occur with  improper specimen collection/handling, submission of specimen other than nasopharyngeal swab, presence of viral mutation(s) within the areas targeted by this assay, and inadequate number of viral copies(<138 copies/mL). A negative result must be  combined with clinical observations, patient history, and epidemiological information. The expected result is Negative.  Fact Sheet for Patients:  EntrepreneurPulse.com.au  Fact Sheet for Healthcare Providers:  IncredibleEmployment.be  This test is no t yet approved or cleared by the [place] FDA and  has been authorized for detection and/or diagnosis of SARS-CoV-2 by FDA under an Emergency Use Authorization (EUA). This EUA will remain  in effect (meaning this test can be used) for the duration of the COVID-19 declaration under Section 564(b)(1) of the Act, 21 U.S.C.section 360bbb-3(b)(1), unless the authorization is terminated  or revoked sooner.       Influenza A by PCR NEGATIVE NEGATIVE Final   Influenza B by PCR NEGATIVE NEGATIVE Final    Comment: (NOTE) The Xpert Xpress SARS-CoV-2/FLU/RSV plus assay is intended as an aid in the diagnosis of influenza from Nasopharyngeal swab specimens and should not be used as a sole basis for treatment. Nasal washings and aspirates are unacceptable for Xpert Xpress SARS-CoV-2/FLU/RSV testing.  Fact Sheet for Patients: EntrepreneurPulse.com.au  Fact Sheet for Healthcare Providers: IncredibleEmployment.be  This test is not yet approved or cleared by the [place] FDA and has been authorized for detection and/or diagnosis of SARS-CoV-2 by FDA under an Emergency Use Authorization (EUA). This EUA will remain in effect (meaning this test can be used) for the duration of the COVID-19 declaration under Section 564(b)(1) of the Act, 21 U.S.C. section 360bbb-3(b)(1),  unless the authorization is terminated or revoked.  Performed at [facility], [address], [place], [place] [address]      Labs: BNP (last 3 results) No results for input(s): BNP in the last 8760 hours. Basic Metabolic Panel: Recent Labs  Lab 07/25/20 1750 07/26/20 0416  NA 135 135  K 3.9 3.8  CL 97* 99  CO2 29 30  GLUCOSE 105* 90  BUN 16 11  CREATININE 0.68 0.55  CALCIUM 9.3 9.3   Liver Function Tests: Recent Labs  Lab 07/25/20 1750  AST 18  ALT 10  ALKPHOS 79  BILITOT 0.5  PROT 7.3  ALBUMIN 3.3*   No results for input(s): LIPASE, AMYLASE in the last 168 hours. No results for input(s): AMMONIA in the last 168 hours. CBC: Recent Labs  Lab 07/25/20 1750 07/26/20 0416  WBC 11.2* 10.4  NEUTROABS 8.0*  --   HGB 12.6 11.1*  HCT 39.5 34.6*  MCV 94.7 95.6  PLT 94* 77*   Cardiac Enzymes: No results for input(s): CKTOTAL, CKMB, CKMBINDEX, TROPONINI in the last 168 hours. BNP: Invalid input(s): POCBNP CBG: Recent Labs  Lab 07/25/20 1744  GLUCAP 121*   D-Dimer No results for input(s): DDIMER in the last 72 hours. Hgb A1c No results for input(s): HGBA1C in the last 72 hours. Lipid Profile No results for input(s): CHOL, HDL, LDLCALC, TRIG, CHOLHDL, LDLDIRECT in the last 72 hours. Thyroid function studies Recent Labs    07/25/20 1733  TSH 1.166   Anemia work up No results for input(s): VITAMINB12, FOLATE, FERRITIN, TIBC, IRON, RETICCTPCT in the last 72 hours. Urinalysis    Component Value Date/Time   COLORURINE YELLOW 07/25/2020 1936   APPEARANCEUR CLEAR 07/25/2020 1936   LABSPEC 1.011 07/25/2020 1936   PHURINE 9.0 (H) 07/25/2020 1936   GLUCOSEU NEGATIVE 07/25/2020 1936   HGBUR NEGATIVE 07/25/2020 1936   BILIRUBINUR NEGATIVE 07/25/2020 1936   KETONESUR NEGATIVE 07/25/2020 1936   PROTEINUR NEGATIVE 07/25/2020 1936   UROBILINOGEN 0.2 05/12/2013 0210   NITRITE POSITIVE (A) 07/25/2020 1936   LEUKOCYTESUR NEGATIVE 07/25/2020  1936   Sepsis  Labs Invalid input(s): PROCALCITONIN,  WBC,  LACTICIDVEN Microbiology Recent Results (from the past 240 hour(s))  Blood Culture (routine x 2)     Status: None (Preliminary result)   Collection Time: 07/25/20  5:50 PM   Specimen: Right Antecubital; Blood  Result Value Ref Range Status   Specimen Description RIGHT ANTECUBITAL  Final   Special Requests   Final    BOTTLES DRAWN AEROBIC AND ANAEROBIC Blood Culture adequate volume   Culture   Final    NO GROWTH 2 DAYS Performed at [facility], [address], [place], [place] [address]    Report Status PENDING  Incomplete  Blood Culture (routine x 2)     Status: None (Preliminary result)   Collection Time: 07/25/20  6:41 PM   Specimen: Left Antecubital; Blood  Result Value Ref Range Status   Specimen Description LEFT ANTECUBITAL  Final   Special Requests   Final    BOTTLES DRAWN AEROBIC AND ANAEROBIC Blood Culture adequate volume   Culture   Final    NO GROWTH 2 DAYS Performed at [facility], [address], [place], [place] [address]    Report Status PENDING  Incomplete  Urine culture     Status: Abnormal   Collection Time: 07/25/20  9:36 PM   Specimen: In/Out Cath Urine  Result Value Ref Range Status   Specimen Description   Final    IN/OUT CATH URINE Performed at [facility], [address], [place], [place] [address]    Special Requests   Final    NONE Performed at [facility], [address], [place], [place] [address]    Culture (A)  Final    >=100,000 COLONIES/mL ESCHERICHIA COLI Confirmed Extended Spectrum Beta-Lactamase Producer (ESBL).  In bloodstream infections from ESBL organisms, carbapenems are preferred over piperacillin/tazobactam. They are shown to have a lower risk of mortality.    Report Status 07/28/2020 FINAL  Final   Organism ID, Bacteria ESCHERICHIA COLI (A)  Final      Susceptibility   Escherichia coli - MIC*    AMPICILLIN >=32 RESISTANT Resistant     CEFAZOLIN >=64 RESISTANT Resistant     CEFEPIME  >=32 RESISTANT Resistant     CEFTRIAXONE >=64 RESISTANT Resistant     CIPROFLOXACIN >=4 RESISTANT Resistant     GENTAMICIN <=1 SENSITIVE Sensitive     IMIPENEM <=0.25 SENSITIVE Sensitive     NITROFURANTOIN <=16 SENSITIVE Sensitive     TRIMETH/SULFA >=320 RESISTANT Resistant     AMPICILLIN/SULBACTAM >=32 RESISTANT Resistant     PIP/TAZO 8 SENSITIVE Sensitive     * >=100,000 COLONIES/mL ESCHERICHIA COLI  Resp Panel by RT-PCR (Flu A&B, Covid) Nasopharyngeal Swab     Status: None   Collection Time: 07/25/20  9:40 PM   Specimen: Nasopharyngeal Swab; Nasopharyngeal(NP) swabs in vial transport medium  Result Value Ref Range Status   SARS Coronavirus 2 by RT PCR NEGATIVE NEGATIVE Final    Comment: (NOTE) SARS-CoV-2 target nucleic acids are NOT DETECTED.  The SARS-CoV-2 RNA is generally detectable in upper respiratory specimens during the acute phase of infection. The lowest concentration of SARS-CoV-2 viral copies this assay can detect is 138 copies/mL. A negative result does not preclude SARS-Cov-2 infection and should not be used as the sole basis for treatment or other patient management decisions. A negative result may occur with  improper specimen collection/handling, submission of specimen other than nasopharyngeal swab, presence of viral mutation(s) within the areas targeted by this assay,  and inadequate number of viral copies(<138 copies/mL). A negative result must be combined with clinical observations, patient history, and epidemiological information. The expected result is Negative.  Fact Sheet for Patients:  EntrepreneurPulse.com.au  Fact Sheet for Healthcare Providers:  IncredibleEmployment.be  This test is no t yet approved or cleared by the [place] FDA and  has been authorized for detection and/or diagnosis of SARS-CoV-2 by FDA under an Emergency Use Authorization (EUA). This EUA will remain  in effect (meaning this test can be  used) for the duration of the COVID-19 declaration under Section 564(b)(1) of the Act, 21 U.S.C.section 360bbb-3(b)(1), unless the authorization is terminated  or revoked sooner.       Influenza A by PCR NEGATIVE NEGATIVE Final   Influenza B by PCR NEGATIVE NEGATIVE Final    Comment: (NOTE) The Xpert Xpress SARS-CoV-2/FLU/RSV plus assay is intended as an aid in the diagnosis of influenza from Nasopharyngeal swab specimens and should not be used as a sole basis for treatment. Nasal washings and aspirates are unacceptable for Xpert Xpress SARS-CoV-2/FLU/RSV testing.  Fact Sheet for Patients: EntrepreneurPulse.com.au  Fact Sheet for Healthcare Providers: IncredibleEmployment.be  This test is not yet approved or cleared by the [place] FDA and has been authorized for detection and/or diagnosis of SARS-CoV-2 by FDA under an Emergency Use Authorization (EUA). This EUA will remain in effect (meaning this test can be used) for the duration of the COVID-19 declaration under Section 564(b)(1) of the Act, 21 U.S.C. section 360bbb-3(b)(1), unless the authorization is terminated or revoked.  Performed at [facility], [address] [place], [place] [address]      Patient was seen and examined on the day of discharge and was found to be in stable condition. Time coordinating discharge: 25 minutes including assessment and coordination of care, as well as examination of the patient.   SIGNED:  Dessa Phi, DO [facility] 07/28/2020, 9:11 AM

## 2020-07-28 NOTE — TOC Transition Note (Signed)
Transition of Care [facility]) - CM/SW Discharge Note   Patient Details  Name: Cassidy Smith MRN: [id number] Date of Birth: [birth year]  Transition of Care [facility]) CM/SW Contact:  Iona Beard, [place] Phone Number: 07/28/2020, 10:18 AM   Clinical Narrative:    CSW spoke to [place] with [facility] to inform that orders were placed for [facility] PT/OT/RN/Aide. The will start care for pt tomorrow. CSW spoke to pts son Mr. Cephas to inform him of start of care. He is understanding and aware pt is discharging today and has arranged transportation. TOC signing off.   Final next level of care: [place] Barriers to Discharge: Barriers Resolved   Patient Goals and CMS Choice Patient states their goals for this hospitalization and ongoing recovery are:: Home with [facility] CMS Medicare.gov Compare Post Acute Care list provided to:: Patient Represenative (must comment) Choice offered to / list presented to : Adult Children  Discharge Placement                       Discharge Plan and Services In-house Referral: Clinical Social Work Discharge Planning Services: CM Consult Post Acute Care Choice: Home Health                    HH Arranged: PT, OT, RN, Nurse's Aide [place] Agency: [place] ([facility]) Date [place]: 07/26/20 Time [place]: 1222 Representative spoke with at [place]: [place] ([place]) Interventions     Readmission Risk Interventions Readmission Risk Prevention Plan 07/26/2020  Transportation Screening Complete  Home Care Screening Complete  Medication Review (RN CM) Complete  Some recent data might be hidden

## 2020-07-30 LAB — CULTURE, BLOOD (ROUTINE X 2)
Culture: NO GROWTH
Culture: NO GROWTH
Special Requests: ADEQUATE
Special Requests: ADEQUATE

## 2020-08-11 ENCOUNTER — Other Ambulatory Visit: Payer: Self-pay

## 2020-08-11 ENCOUNTER — Emergency Department (HOSPITAL_COMMUNITY): Payer: Medicare Other

## 2020-08-11 ENCOUNTER — Emergency Department (HOSPITAL_COMMUNITY)
Admission: EM | Admit: 2020-08-11 | Discharge: 2020-08-11 | Disposition: A | Discharge status: Home / Self Care | Payer: Medicare Other | Source: Home / Self Care | Attending: Emergency Medicine | Admitting: Emergency Medicine

## 2020-08-11 ENCOUNTER — Encounter (HOSPITAL_COMMUNITY): Payer: Self-pay

## 2020-08-11 DIAGNOSIS — M25571 Pain in right ankle and joints of right foot: Secondary | ICD-10-CM

## 2020-08-11 DIAGNOSIS — Z87891 Personal history of nicotine dependence: Secondary | ICD-10-CM | POA: Insufficient documentation

## 2020-08-11 DIAGNOSIS — Z7982 Long term (current) use of aspirin: Secondary | ICD-10-CM | POA: Insufficient documentation

## 2020-08-11 DIAGNOSIS — F0390 Unspecified dementia without behavioral disturbance: Secondary | ICD-10-CM | POA: Insufficient documentation

## 2020-08-11 DIAGNOSIS — N189 Chronic kidney disease, unspecified: Secondary | ICD-10-CM | POA: Insufficient documentation

## 2020-08-11 DIAGNOSIS — W1839XA Other fall on same level, initial encounter: Secondary | ICD-10-CM | POA: Insufficient documentation

## 2020-08-11 DIAGNOSIS — R6510 Systemic inflammatory response syndrome (SIRS) of non-infectious origin without acute organ dysfunction: Secondary | ICD-10-CM | POA: Diagnosis not present

## 2020-08-11 DIAGNOSIS — F0391 Unspecified dementia with behavioral disturbance: Secondary | ICD-10-CM | POA: Diagnosis not present

## 2020-08-11 DIAGNOSIS — M25572 Pain in left ankle and joints of left foot: Secondary | ICD-10-CM | POA: Insufficient documentation

## 2020-08-11 MED ORDER — ACETAMINOPHEN 325 MG PO TABS
650.0000 mg | ORAL_TABLET | Freq: Once | ORAL | Status: AC
  Administered 2020-08-11: 650 mg via ORAL
  Filled 2020-08-11: qty 2

## 2020-08-11 NOTE — ED Notes (Signed)
Care giver came to transport patient home and states that patient ambulated up and down stairs multiple times last night without complaints of pain or discomfort. States she is "playing [place]." States had she known that she was being sent to ED for eval of her ankle she would not have allowed it. Patient assisted into car and caregiver assures this writer that she will be fine.

## 2020-08-11 NOTE — ED Provider Notes (Signed)
[facility] Provider Note   CSN: [id number] Arrival date & time: 08/11/20  1250     History Chief Complaint  Patient presents with   Ankle Pain    Cassidy Smith is a 65 y.o. female with a history as outlined below presenting for evaluation of left foot and ankle pain.  She reports "jumping" yesterday and had sudden onset of pain in her foot and ankle.  Patient has a history of dementia and is a poor history giver, she is unable to give much further detail about the event but she denies any other pain including knee or hip pain.  She has had no treatment prior to arrival.  She states she has not been able to bear weight on the extremity since the injury occurred.  She denies history of prior injury to this foot or ankle.  Per review of chart patient had another fall on July 9 at her home.  She has 24/7 nursing care in her home.  She typically ambulates with the assistance of her home health RN.  The history is provided by the patient.      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    Patient Active Problem List   Diagnosis Date Noted   Sepsis ([place]) 07/25/2020   Sepsis with acute hypoxic respiratory failure without septic shock (HCC)    Dementia without behavioral disturbance ([place])    Palliative care by specialist    Goals of care, counseling/discussion    Pneumonia of right lower lobe due to infectious organism 11/06/2019   Intracerebral hematoma ([place]) 07/04/2019   Closed head injury 07/03/2019   Thrombocytopenia ([place]) 07/03/2019   UTI (urinary tract infection) 01/04/2018   Neuromuscular disorder ([place])    Dysuria 10/30/2017   Fever  08/15/2017   Hypokalemia 08/15/2017   Altered mental state 123456   Acute metabolic encephalopathy 123456   Valproic acid toxicity 08/15/2017   Altered mental status    Ischemic stroke ([place])    GERD (gastroesophageal reflux disease) 05/12/2017   GI bleed    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   Dysplastic rectal polyp    Protein-calorie malnutrition, severe 04/07/2017   Pressure injury of skin 04/04/2017   Anticoagulated 04/03/2017   Stool bloody 04/03/2017   Current every day smoker 04/03/2017   Rectal bleeding    Chronic diarrhea    Hyperthyroidism    S/P ORIF (open reduction internal fixation) fracture right hip IM nail 03/07/17 03/24/2017   Hypomagnesemia 03/07/2017   Fall    Closed intertrochanteric fracture of hip, right, initial encounter ([place]) 03/06/2017   Radial styloid fracture: right 03/06/2017   Anemia 03/06/2017   Orthostatic hypotension 06/07/2015   Lower urinary tract infectious disease 06/07/2015   Rhabdomyolysis 06/07/2015   White matter abnormality on MRI of brain 05/14/2013   History of depression 05/14/2013   Hx of anxiety disorder 05/14/2013   Bipolar I disorder, most recent episode (or current) manic, unspecified 05/14/2013   Bipolar 1 disorder ([place]) 05/14/2013   Hyponatremia 05/12/2013   Bipolar disorder ([place]) 05/12/2013  Lacunar infarct, acute ([place]) 05/12/2013    Past Surgical History:  Procedure Laterality Date   COLONOSCOPY WITH PROPOFOL N/A 04/05/2017   Procedure: COLONOSCOPY WITH PROPOFOL;  Surgeon: Rogene Houston, MD;  [facility];  Service: Endoscopy;  Laterality: N/A;   INTRAMEDULLARY (IM) NAIL INTERTROCHANTERIC Right 03/07/2017   Procedure: OPEN TREATMENT INTERNAL FIXATION RIGHT HIP WITH GAMA INTRAMEDULARY NAIL;  Surgeon: Carole Civil, MD;  Location: [facility] ORS;  Service: Orthopedics;  Laterality: Right;   MULTIPLE EXTRACTIONS WITH ALVEOLOPLASTY N/A 10/30/2012   Procedure: MULTIPLE  EXTRACION 5, 6, 8, 9, 10 ,18, 19, 31 WITH MAXILLARY RIGHT AND LEFT  ALVEOLOPLASTY REDUCE MAXILLARY LEFT TUBEROSITY;  Surgeon: Gae Bon, DDS;  Location: [facility];  Service: Oral Surgery;  Laterality: N/A;   POLYPECTOMY  04/05/2017   Procedure: POLYPECTOMY;  Surgeon: Rogene Houston, MD;  Location: [facility];  Service: Endoscopy;;  recto-sigmoid, rectum     OB History   No obstetric history on file.     Family History  Problem Relation Age of Onset   Breast cancer Mother    Cancer - Other Mother    Alcoholism Father    Colon cancer Neg Hx    Colon polyps Neg Hx     Social History   Tobacco Use   Smoking status: Former    Packs/day: 0.50    Years: 20.00    Pack years: 10.00    Types: Cigarettes    Quit date: 05/05/2017    Years since quitting: 3.2   Smokeless tobacco: Never   Tobacco comments:    patient states she quit one week ago  Vaping Use   Vaping Use: Never used  Substance Use Topics   Alcohol use: No   Drug use: No    Home Medications Prior to Admission medications   Medication Sig Start Date End Date Taking? Authorizing Provider  aspirin EC 81 MG tablet Take 81 mg by mouth daily. Swallow whole.    [provider]  atorvastatin (LIPITOR) 20 MG tablet Take 1 tablet (20 mg total) by mouth daily at 6 PM. 06/11/17   Barton Dubois, MD  benztropine (COGENTIN) 1 MG tablet Take 1 tablet (1 mg total) by mouth 2 (two) times daily. 01/05/18   Johnson, Clanford L, MD  Cholecalciferol (VITAMIN D-3) 125 MCG (5000 UT) TABS Take 5,000 Units by mouth daily.    [provider]  divalproex (DEPAKOTE ER) 500 MG 24 hr tablet Take 2 tablets (1,000 mg total) by mouth at bedtime. 08/16/17   Orson Eva, MD  furosemide (LASIX) 20 MG tablet Take 10 mg by mouth daily. 07/06/20   [provider]  gabapentin (NEURONTIN) 100 MG capsule Take 1 capsule (100 mg total) by mouth 3 (three) times daily. 05/13/17   Carole Civil, MD  methimazole (TAPAZOLE) 10 MG  tablet Take 10 mg by mouth every other day. 07/06/20   [provider]  MUCINEX 600 MG 12 hr tablet SMARTSIG:1 Tablet(s) By Mouth Every 12 Hours 02/11/20   [provider]  Multiple Vitamin (MULTIVITAMIN WITH MINERALS) TABS tablet Take 1 tablet by mouth daily. Centrum Silver    [provider]  pantoprazole (PROTONIX) 40 MG tablet Take 40 mg by mouth daily.    [provider]  potassium chloride (K-DUR,KLOR-CON) 10 MEQ tablet Take 1 tablet (10 mEq total) by mouth every other day. 01/08/18   Johnson, Clanford L, MD  risperiDONE (RISPERDAL) 2 MG tablet Take 2 mg by mouth  at bedtime. 06/21/19   [provider]  traZODone (DESYREL) 100 MG tablet Take 200 mg by mouth at bedtime.    [provider]  vitamin B-12 (CYANOCOBALAMIN) 1000 MCG tablet Take 1 tablet (1,000 mcg total) by mouth daily. 06/11/17   Barton Dubois, MD    Allergies    Patient has no known allergies.  Review of Systems   Review of Systems  Unable to perform ROS: Dementia  Musculoskeletal:  Positive for arthralgias. Negative for joint swelling.  Skin:  Negative for wound.       Fading bruise noted left distal great toe (suspect from injury on the 9th).   Physical Exam Updated Vital Signs BP 103/73 (BP Location: Left Arm)   Pulse 79   Temp 99 F (37.2 C) (Oral)   Resp 18   Ht '5\' 4"'$  (1.626 m)   Wt 54.4 kg   SpO2 91%   BMI 20.59 kg/m   Physical Exam Vitals and nursing note reviewed.  Constitutional:      Appearance: She is well-developed.  HENT:     Head: Normocephalic and atraumatic.  Cardiovascular:     Rate and Rhythm: Normal rate.     Pulses: Normal pulses. No decreased pulses.          Dorsalis pedis pulses are 2+ on the right side and 2+ on the left side.       Posterior tibial pulses are 2+ on the right side and 2+ on the left side.  Musculoskeletal:        General: Tenderness present. No swelling or deformity.     Cervical back: No bony tenderness.      Thoracic back: No bony tenderness.     Lumbar back: No bony tenderness.     Right lower leg: No edema.     Left lower leg: No edema.     Right ankle: No swelling or ecchymosis. No tenderness. No lateral malleolus or proximal fibula tenderness. Normal range of motion. Normal pulse.     Right Achilles Tendon: Normal.     Left ankle: No swelling or deformity. Tenderness present over the lateral malleolus and medial malleolus. No base of 5th metatarsal or proximal fibula tenderness. Normal range of motion.  Skin:    General: Skin is warm and dry.     Findings: No lesion.  Neurological:     Mental Status: She is alert.     Sensory: No sensory deficit.    ED Results / Procedures / Treatments   Labs (all labs ordered are listed, but only abnormal results are displayed) Labs Reviewed - No data to display  EKG None  Radiology DG Ankle Complete Left  Result Date: 08/11/2020 CLINICAL DATA:  Left foot and ankle pain after falling. EXAM: LEFT ANKLE COMPLETE - 3+ VIEW COMPARISON:  Left foot 08/11/2020 FINDINGS: Negative for fracture or dislocation. Normal alignment at the left ankle. No focal soft tissue abnormality. IMPRESSION: No acute bone abnormality to the left ankle. Electronically Signed   By: Markus Daft M.D.   On: 08/11/2020 14:06   DG Ankle Complete Right  Result Date: 08/11/2020 CLINICAL DATA:  Right foot and ankle pain after a fall. Initial encounter. EXAM: RIGHT ANKLE - COMPLETE 3+ VIEW COMPARISON:  Plain films right foot 07/22/2020. FINDINGS: There is no evidence of fracture, dislocation, or joint effusion. There is no evidence of arthropathy or other focal bone abnormality. Soft tissues are unremarkable. IMPRESSION: Negative exam. Electronically Signed   By: Inge Rise  M.D.   On: 08/11/2020 16:00   DG Foot Complete Left  Result Date: 08/11/2020 CLINICAL DATA:  Fall.  Complaints of left foot and ankle pain. EXAM: LEFT FOOT - COMPLETE 3+ VIEW COMPARISON:  Left ankle 08/11/2020  FINDINGS: No evidence for an acute fracture or dislocation. Mild cortical irregularity involving the little toe proximal phalanx along the distal aspect. Suspect this is an old injury. Normal alignment of the left foot. IMPRESSION: 1. No acute abnormality involving the left foot. 2. Mild irregularity involving little toe proximal phalanx as described. Suspect this is an old injury but consider clinical correlation in this area. Electronically Signed   By: Markus Daft M.D.   On: 08/11/2020 14:04   DG Foot Complete Right  Result Date: 08/11/2020 CLINICAL DATA:  Right foot and ankle pain after a fall. Initial encounter. EXAM: RIGHT FOOT COMPLETE - 3+ VIEW COMPARISON:  Plain films right ankle 07/22/2020. FINDINGS: There is no evidence of fracture or dislocation. There is no evidence of arthropathy or other focal bone abnormality. Soft tissues are unremarkable. IMPRESSION: Negative exam. Electronically Signed   By: Inge Rise M.D.   On: 08/11/2020 15:59    Procedures Procedures   Medications Ordered in ED Medications  acetaminophen (TYLENOL) tablet 650 mg (650 mg Oral Given 08/11/20 1647)    ED Course  I have reviewed the triage vital signs and the nursing notes.  Pertinent labs & imaging results that were available during my care of the patient were reviewed by me and considered in my medical decision making (see chart for details).    MDM Rules/Calculators/A&P                           At reevaluation and discussion of x-ray findings, patient states "what about my right foot".  When told originally she had complaints of left foot and ankle pain she stated maybe she was confused and it is her right foot that hurts.  She was sent back for x-rays of her right foot and ankle which are also stable.  Her right foot and ankle are normal on exam, no edema, pulses present.  She was placed in an Ace wrap.  Advised rest ice compression and elevation, Tylenol as needed.  Her primary nurse caregiver is  now present who states that patient had no difficulty ambulating last night and throughout today.  She had no known falls or injury while in her care and and suggest that patient is confabulating which she is known to do.  With normal imaging, felt that she was stable for discharge home.  She is not tender at her left fifth toe, suspect findings are chronic. Final Clinical Impression(s) / ED Diagnoses Final diagnoses:  Acute right ankle pain    Rx / DC Orders ED Discharge Orders     None        Landis Martins 08/12/20 [place], MD 08/14/20 1037

## 2020-08-11 NOTE — ED Triage Notes (Addendum)
Patient fell within last two days. Complaining of left ankle pain.

## 2020-08-11 NOTE — Discharge Instructions (Signed)
Your x-rays are negative for any acute obvious injury or fracture.  Your exam is reassuring.  I recommend using ice packs and elevation is much as is comfortable for the next several days.  You have been given an Ace wrap to give you some compression of your foot and ankle.  Plan a recheck by your primary doctor if your symptoms are not improving over the next week with this treatment plan.  I recommend taking arthritis strength Tylenol per label instructions, in other words 650 mg every 6 hours for the next week which should help improve your symptoms as well.

## 2020-08-11 NOTE — ED Notes (Signed)
Called son and brother and they state that the patient's caregiver is on the way to ED to pick up patient.

## 2020-08-12 ENCOUNTER — Other Ambulatory Visit: Payer: Self-pay

## 2020-08-12 ENCOUNTER — Inpatient Hospital Stay (HOSPITAL_COMMUNITY)
Admission: EM | Admit: 2020-08-12 | Discharge: 2020-08-14 | DRG: 884 | Disposition: A | Discharge status: Home Health | Payer: Medicare Other | Attending: Family Medicine | Admitting: Family Medicine

## 2020-08-12 ENCOUNTER — Encounter (HOSPITAL_COMMUNITY): Payer: Self-pay | Admitting: *Deleted

## 2020-08-12 ENCOUNTER — Emergency Department (HOSPITAL_COMMUNITY): Payer: Medicare Other

## 2020-08-12 DIAGNOSIS — F0390 Unspecified dementia without behavioral disturbance: Secondary | ICD-10-CM | POA: Diagnosis present

## 2020-08-12 DIAGNOSIS — F419 Anxiety disorder, unspecified: Secondary | ICD-10-CM | POA: Diagnosis present

## 2020-08-12 DIAGNOSIS — Z86718 Personal history of other venous thrombosis and embolism: Secondary | ICD-10-CM

## 2020-08-12 DIAGNOSIS — D649 Anemia, unspecified: Secondary | ICD-10-CM | POA: Diagnosis present

## 2020-08-12 DIAGNOSIS — R6510 Systemic inflammatory response syndrome (SIRS) of non-infectious origin without acute organ dysfunction: Secondary | ICD-10-CM | POA: Diagnosis present

## 2020-08-12 DIAGNOSIS — Z87440 Personal history of urinary (tract) infections: Secondary | ICD-10-CM

## 2020-08-12 DIAGNOSIS — E0590 Thyrotoxicosis, unspecified without thyrotoxic crisis or storm: Secondary | ICD-10-CM | POA: Diagnosis present

## 2020-08-12 DIAGNOSIS — Z6372 Alcoholism and drug addiction in family: Secondary | ICD-10-CM

## 2020-08-12 DIAGNOSIS — Z79899 Other long term (current) drug therapy: Secondary | ICD-10-CM

## 2020-08-12 DIAGNOSIS — G8929 Other chronic pain: Secondary | ICD-10-CM | POA: Diagnosis present

## 2020-08-12 DIAGNOSIS — Z811 Family history of alcohol abuse and dependence: Secondary | ICD-10-CM

## 2020-08-12 DIAGNOSIS — Z803 Family history of malignant neoplasm of breast: Secondary | ICD-10-CM

## 2020-08-12 DIAGNOSIS — G9341 Metabolic encephalopathy: Secondary | ICD-10-CM | POA: Diagnosis present

## 2020-08-12 DIAGNOSIS — K219 Gastro-esophageal reflux disease without esophagitis: Secondary | ICD-10-CM | POA: Diagnosis present

## 2020-08-12 DIAGNOSIS — F0391 Unspecified dementia with behavioral disturbance: Principal | ICD-10-CM | POA: Diagnosis present

## 2020-08-12 DIAGNOSIS — Z6820 Body mass index (BMI) 20.0-20.9, adult: Secondary | ICD-10-CM

## 2020-08-12 DIAGNOSIS — Z20822 Contact with and (suspected) exposure to covid-19: Secondary | ICD-10-CM | POA: Diagnosis present

## 2020-08-12 DIAGNOSIS — Z8673 Personal history of transient ischemic attack (TIA), and cerebral infarction without residual deficits: Secondary | ICD-10-CM

## 2020-08-12 DIAGNOSIS — I825Z9 Chronic embolism and thrombosis of unspecified deep veins of unspecified distal lower extremity: Secondary | ICD-10-CM | POA: Diagnosis present

## 2020-08-12 DIAGNOSIS — G9389 Other specified disorders of brain: Secondary | ICD-10-CM | POA: Diagnosis present

## 2020-08-12 DIAGNOSIS — E441 Mild protein-calorie malnutrition: Secondary | ICD-10-CM | POA: Diagnosis present

## 2020-08-12 DIAGNOSIS — R509 Fever, unspecified: Secondary | ICD-10-CM | POA: Diagnosis present

## 2020-08-12 DIAGNOSIS — F319 Bipolar disorder, unspecified: Secondary | ICD-10-CM | POA: Diagnosis present

## 2020-08-12 DIAGNOSIS — Z87891 Personal history of nicotine dependence: Secondary | ICD-10-CM

## 2020-08-12 DIAGNOSIS — Z7982 Long term (current) use of aspirin: Secondary | ICD-10-CM

## 2020-08-12 DIAGNOSIS — R824 Acetonuria: Secondary | ICD-10-CM | POA: Diagnosis present

## 2020-08-12 DIAGNOSIS — R4182 Altered mental status, unspecified: Secondary | ICD-10-CM

## 2020-08-12 DIAGNOSIS — D696 Thrombocytopenia, unspecified: Secondary | ICD-10-CM | POA: Diagnosis present

## 2020-08-12 DIAGNOSIS — E876 Hypokalemia: Secondary | ICD-10-CM | POA: Diagnosis present

## 2020-08-12 HISTORY — DX: Personal history of urinary (tract) infections: Z87.440

## 2020-08-12 HISTORY — DX: Gastrointestinal hemorrhage, unspecified: K92.2

## 2020-08-12 LAB — CBC WITH DIFFERENTIAL/PLATELET
Abs Immature Granulocytes: 0.03 10*3/uL (ref 0.00–0.07)
Basophils Absolute: 0 10*3/uL (ref 0.0–0.1)
Basophils Relative: 0 %
Eosinophils Absolute: 0.2 10*3/uL (ref 0.0–0.5)
Eosinophils Relative: 2 %
HCT: 37.3 % (ref 36.0–46.0)
Hemoglobin: 11.9 g/dL — ABNORMAL LOW (ref 12.0–15.0)
Immature Granulocytes: 0 %
Lymphocytes Relative: 15 %
Lymphs Abs: 1.3 10*3/uL (ref 0.7–4.0)
MCH: 30.8 pg (ref 26.0–34.0)
MCHC: 31.9 g/dL (ref 30.0–36.0)
MCV: 96.6 fL (ref 80.0–100.0)
Monocytes Absolute: 0.5 10*3/uL (ref 0.1–1.0)
Monocytes Relative: 6 %
Neutro Abs: 6.1 10*3/uL (ref 1.7–7.7)
Neutrophils Relative %: 77 %
Platelets: 160 10*3/uL (ref 150–400)
RBC: 3.86 MIL/uL — ABNORMAL LOW (ref 3.87–5.11)
RDW: 15.4 % (ref 11.5–15.5)
WBC: 8.1 10*3/uL (ref 4.0–10.5)
nRBC: 0 % (ref 0.0–0.2)

## 2020-08-12 LAB — COMPREHENSIVE METABOLIC PANEL
ALT: 10 U/L (ref 0–44)
AST: 15 U/L (ref 15–41)
Albumin: 3.2 g/dL — ABNORMAL LOW (ref 3.5–5.0)
Alkaline Phosphatase: 70 U/L (ref 38–126)
Anion gap: 7 (ref 5–15)
BUN: 16 mg/dL (ref 8–23)
CO2: 30 mmol/L (ref 22–32)
Calcium: 9.4 mg/dL (ref 8.9–10.3)
Chloride: 98 mmol/L (ref 98–111)
Creatinine, Ser: 0.68 mg/dL (ref 0.44–1.00)
GFR, Estimated: >60 mL/min (ref >60)
Glucose, Bld: 96 mg/dL (ref 70–99)
Potassium: 3.9 mmol/L (ref 3.5–5.1)
Sodium: 135 mmol/L (ref 135–145)
Total Bilirubin: 0.4 mg/dL (ref 0.3–1.2)
Total Protein: 7 g/dL (ref 6.5–8.1)

## 2020-08-12 LAB — URINALYSIS, ROUTINE W REFLEX MICROSCOPIC
Bilirubin Urine: NEGATIVE
Glucose, UA: NEGATIVE mg/dL
Hgb urine dipstick: NEGATIVE
Ketones, ur: 5 mg/dL — AB
Leukocytes,Ua: NEGATIVE
Nitrite: NEGATIVE
Protein, ur: NEGATIVE mg/dL
Specific Gravity, Urine: 1.015 (ref 1.005–1.030)
pH: 8 (ref 5.0–8.0)

## 2020-08-12 LAB — RESP PANEL BY RT-PCR (FLU A&B, COVID) ARPGX2
Influenza A by PCR: NEGATIVE
Influenza B by PCR: NEGATIVE
SARS Coronavirus 2 by RT PCR: NEGATIVE

## 2020-08-12 LAB — LACTIC ACID, PLASMA
Lactic Acid, Venous: 1.3 mmol/L (ref 0.5–1.9)
Lactic Acid, Venous: 0.6 mmol/L (ref 0.5–1.9)

## 2020-08-12 LAB — PROCALCITONIN: Procalcitonin: <0.1 ng/mL

## 2020-08-12 LAB — PROTIME-INR
INR: 1 (ref 0.8–1.2)
Prothrombin Time: 13.2 seconds (ref 11.4–15.2)

## 2020-08-12 LAB — MAGNESIUM: Magnesium: 1.5 mg/dL — ABNORMAL LOW (ref 1.7–2.4)

## 2020-08-12 MED ORDER — SODIUM CHLORIDE 0.9 % IV SOLN
2.0000 g | Freq: Once | INTRAVENOUS | Status: AC
  Administered 2020-08-12: 2 g via INTRAVENOUS
  Filled 2020-08-12: qty 2

## 2020-08-12 MED ORDER — METRONIDAZOLE 500 MG/100ML IV SOLN
500.0000 mg | Freq: Three times a day (TID) | INTRAVENOUS | Status: AC
Start: 2020-08-13 — End: 2020-08-13
  Administered 2020-08-13 (×2): 500 mg via INTRAVENOUS
  Filled 2020-08-12 (×2): qty 100

## 2020-08-12 MED ORDER — SODIUM CHLORIDE 0.9 % IV SOLN
1.0000 g | Freq: Once | INTRAVENOUS | Status: AC
  Administered 2020-08-12: 1 g via INTRAVENOUS
  Filled 2020-08-12: qty 10

## 2020-08-12 MED ORDER — SODIUM CHLORIDE 0.9 % IV BOLUS
1000.0000 mL | Freq: Once | INTRAVENOUS | Status: AC
  Administered 2020-08-12: 1000 mL via INTRAVENOUS

## 2020-08-12 MED ORDER — VITAMIN D 25 MCG (1000 UNIT) PO TABS
5000.0000 [IU] | ORAL_TABLET | Freq: Every day | ORAL | Status: DC
  Administered 2020-08-13 – 2020-08-14 (×2): 5000 [IU] via ORAL
  Filled 2020-08-12 (×2): qty 5

## 2020-08-12 MED ORDER — VANCOMYCIN HCL IN DEXTROSE 1-5 GM/200ML-% IV SOLN
1000.0000 mg | Freq: Once | INTRAVENOUS | Status: AC
  Administered 2020-08-12: 1000 mg via INTRAVENOUS
  Filled 2020-08-12: qty 200

## 2020-08-12 MED ORDER — GABAPENTIN 100 MG PO CAPS
100.0000 mg | ORAL_CAPSULE | Freq: Three times a day (TID) | ORAL | Status: DC
  Administered 2020-08-12 – 2020-08-14 (×6): 100 mg via ORAL
  Filled 2020-08-12 (×6): qty 1

## 2020-08-12 MED ORDER — VANCOMYCIN HCL IN DEXTROSE 1-5 GM/200ML-% IV SOLN
1000.0000 mg | INTRAVENOUS | Status: DC
  Administered 2020-08-13: 1000 mg via INTRAVENOUS
  Filled 2020-08-12: qty 200

## 2020-08-12 MED ORDER — METHIMAZOLE 5 MG PO TABS
10.0000 mg | ORAL_TABLET | ORAL | Status: DC
  Administered 2020-08-14: 10 mg via ORAL
  Filled 2020-08-12: qty 2

## 2020-08-12 MED ORDER — ASPIRIN EC 81 MG PO TBEC
81.0000 mg | DELAYED_RELEASE_TABLET | Freq: Every day | ORAL | Status: DC
  Administered 2020-08-13 – 2020-08-14 (×2): 81 mg via ORAL
  Filled 2020-08-12 (×2): qty 1

## 2020-08-12 MED ORDER — SODIUM CHLORIDE 0.9 % IV SOLN
2.0000 g | Freq: Two times a day (BID) | INTRAVENOUS | Status: DC
  Administered 2020-08-13 – 2020-08-14 (×3): 2 g via INTRAVENOUS
  Filled 2020-08-12 (×3): qty 2

## 2020-08-12 MED ORDER — FUROSEMIDE 20 MG PO TABS
10.0000 mg | ORAL_TABLET | Freq: Every day | ORAL | Status: DC
  Administered 2020-08-13 – 2020-08-14 (×2): 10 mg via ORAL
  Filled 2020-08-12 (×2): qty 1

## 2020-08-12 MED ORDER — POTASSIUM CHLORIDE CRYS ER 10 MEQ PO TBCR
10.0000 meq | EXTENDED_RELEASE_TABLET | ORAL | Status: DC
  Administered 2020-08-14: 10 meq via ORAL
  Filled 2020-08-12: qty 1

## 2020-08-12 MED ORDER — ONDANSETRON HCL 4 MG PO TABS: 4.0000 mg | ORAL_TABLET | Freq: Four times a day (QID) | ORAL | Status: DC | PRN

## 2020-08-12 MED ORDER — TRAZODONE HCL 50 MG PO TABS
150.0000 mg | ORAL_TABLET | Freq: Every day | ORAL | Status: DC
  Administered 2020-08-12 – 2020-08-13 (×2): 150 mg via ORAL
  Filled 2020-08-12 (×2): qty 3

## 2020-08-12 MED ORDER — METRONIDAZOLE 500 MG/100ML IV SOLN
500.0000 mg | Freq: Once | INTRAVENOUS | Status: AC
  Administered 2020-08-12: 500 mg via INTRAVENOUS
  Filled 2020-08-12: qty 100

## 2020-08-12 MED ORDER — ONDANSETRON HCL 4 MG/2ML IJ SOLN: 4.0000 mg | Freq: Four times a day (QID) | INTRAMUSCULAR | Status: DC | PRN

## 2020-08-12 MED ORDER — VITAMIN B-12 1000 MCG PO TABS
1000.0000 ug | ORAL_TABLET | Freq: Every day | ORAL | Status: DC
  Administered 2020-08-13 – 2020-08-14 (×2): 1000 ug via ORAL
  Filled 2020-08-12 (×2): qty 1

## 2020-08-12 MED ORDER — RISPERIDONE 1 MG PO TABS
2.0000 mg | ORAL_TABLET | Freq: Every day | ORAL | Status: DC
  Administered 2020-08-12 – 2020-08-13 (×2): 2 mg via ORAL
  Filled 2020-08-12 (×2): qty 2

## 2020-08-12 MED ORDER — ATORVASTATIN CALCIUM 20 MG PO TABS
20.0000 mg | ORAL_TABLET | Freq: Every day | ORAL | Status: DC
  Administered 2020-08-13 – 2020-08-14 (×2): 20 mg via ORAL
  Filled 2020-08-12 (×2): qty 1

## 2020-08-12 MED ORDER — MAGNESIUM SULFATE 2 GM/50ML IV SOLN
2.0000 g | Freq: Once | INTRAVENOUS | Status: AC
  Administered 2020-08-12: 2 g via INTRAVENOUS
  Filled 2020-08-12: qty 50

## 2020-08-12 MED ORDER — ACETAMINOPHEN 325 MG PO TABS: 650.0000 mg | ORAL_TABLET | Freq: Four times a day (QID) | ORAL | Status: DC | PRN

## 2020-08-12 MED ORDER — ENOXAPARIN SODIUM 40 MG/0.4ML IJ SOSY
40.0000 mg | PREFILLED_SYRINGE | INTRAMUSCULAR | Status: DC
  Administered 2020-08-12 – 2020-08-13 (×2): 40 mg via SUBCUTANEOUS
  Filled 2020-08-12 (×2): qty 0.4

## 2020-08-12 MED ORDER — ACETAMINOPHEN 650 MG RE SUPP: 650.0000 mg | Freq: Four times a day (QID) | RECTAL | Status: DC | PRN

## 2020-08-12 MED ORDER — PANTOPRAZOLE SODIUM 40 MG PO TBEC
40.0000 mg | DELAYED_RELEASE_TABLET | Freq: Every day | ORAL | Status: DC
  Administered 2020-08-13 – 2020-08-14 (×2): 40 mg via ORAL
  Filled 2020-08-12 (×2): qty 1

## 2020-08-12 MED ORDER — BENZTROPINE MESYLATE 1 MG PO TABS
1.0000 mg | ORAL_TABLET | Freq: Two times a day (BID) | ORAL | Status: DC
  Administered 2020-08-12 – 2020-08-14 (×4): 1 mg via ORAL
  Filled 2020-08-12 (×4): qty 1

## 2020-08-12 MED ORDER — ACETAMINOPHEN 650 MG RE SUPP
650.0000 mg | Freq: Once | RECTAL | Status: AC
  Administered 2020-08-12: 650 mg via RECTAL
  Filled 2020-08-12: qty 1

## 2020-08-12 MED ORDER — DIVALPROEX SODIUM ER 500 MG PO TB24
1000.0000 mg | ORAL_TABLET | Freq: Every day | ORAL | Status: DC
  Administered 2020-08-12 – 2020-08-13 (×2): 1000 mg via ORAL
  Filled 2020-08-12 (×2): qty 2

## 2020-08-12 NOTE — ED Notes (Signed)
ED TO INPATIENT HANDOFF REPORT  ED Nurse Name and Phone #:  [phone]  S Name/Age/Gender Cassidy Smith 65 y.o. female Room/Bed: APA16A/APA16A  Code Status   Code Status: Full Code  Home/SNF/Other Home Patient oriented to: self Is this baseline? No   Triage Complete: Triage complete  Chief Complaint SIRS (systemic inflammatory response syndrome) (HCC) [R65.10]  Triage Note Pt brought in from home by [facility] with c/o AMS and fever. Temp 101, BP 123/63, H 87 for EMS. Pt here a few weeks ago for UTI and was admitted for IV antibiotics. Pt does have dementia but is altered much more than normal.     Allergies No Known Allergies  Level of Care/Admitting Diagnosis ED Disposition    ED Disposition  Admit   Condition  --   [place]: [facility] [id number]  Level of Care: Telemetry [5]  Covid Evaluation: Asymptomatic Screening Protocol (No Symptoms)  Diagnosis: SIRS (systemic inflammatory response syndrome) [facility]) [id number]  Admitting Physician: Reubin Milan [id number]  Attending Physician: Reubin Milan [id number]         B Medical/Surgery History Past Medical History:  Diagnosis Date  . Anxiety   . Bipolar 1 disorder ([place])   . Chronic deep vein thrombosis (DVT) of distal vein of lower extremity ([place]) 07/10/2017  . Chronic hip pain   . Depression   . GI bleed   . Headache(784.0)   . History of UTI/bladder spams.   . Hyperthyroidism   . Hypokalemia 08/15/2017  . Intracerebral hematoma ([place]) 07/04/2019  . Ischemic stroke ([place])   . Neuromuscular disorder (HCC)    shaking of hands   . Pneumonia of right lower lobe due to infectious organism 11/06/2019  . Radial styloid fracture: right 03/06/2017  . Rhabdomyolysis 06/07/2015  . 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 Thyrotoxicosis, unspecified with thyrotoxic crisis or storm 01/15/2019   Past Surgical History:  Procedure Laterality Date  .  COLONOSCOPY WITH PROPOFOL N/A 04/05/2017   Procedure: COLONOSCOPY WITH PROPOFOL;  Surgeon: Rogene [place], MD;  [facility];  Service: Endoscopy;  Laterality: N/A;  . INTRAMEDULLARY (IM) NAIL INTERTROCHANTERIC Right 03/07/2017   Procedure: OPEN TREATMENT INTERNAL FIXATION RIGHT HIP WITH GAMA INTRAMEDULARY NAIL;  Surgeon: Carole Civil, MD;  Location: [facility] ORS;  Service: Orthopedics;  Laterality: Right;  . MULTIPLE EXTRACTIONS WITH ALVEOLOPLASTY N/A 10/30/2012   Procedure: MULTIPLE EXTRACION 5, 6, 8, 9, 10 ,18, 19, 31 WITH MAXILLARY RIGHT AND LEFT  ALVEOLOPLASTY REDUCE MAXILLARY LEFT TUBEROSITY;  Surgeon: Gae Bon, DDS;  Location: [facility];  Service: Oral Surgery;  Laterality: N/A;  . POLYPECTOMY  04/05/2017   Procedure: POLYPECTOMY;  Surgeon: Rogene [place], MD;  [facility];  Service: Endoscopy;;  recto-sigmoid, rectum     A IV Location/Drains/Wounds Patient Lines/Drains/Airways Status    Active Line/Drains/Airways    Name Placement date Placement time Site Days   Peripheral IV 08/12/20 20 G Right;Lateral Wrist 08/12/20  1404  Wrist  less than 1   Peripheral IV 08/12/20 20 G Right;Anterior Forearm 08/12/20  1404  Forearm  less than 1   Pressure Injury 01/02/18 Stage II -  Partial thickness loss of dermis presenting as a shallow open ulcer with a red, pink wound bed without slough. 01/02/18  0148  -- 953          Intake/Output Last 24 hours  Intake/Output Summary (Last 24 hours) at 08/12/2020 2119 Last data filed at  08/12/2020 1943 Gross per 24 hour  Intake 2300 ml  Output --  Net 2300 ml    Labs/Imaging Results for orders placed or performed during the hospital encounter of 08/12/20 (from the past 48 hour(s))  Urinalysis, Routine w reflex microscopic Urine, In & Out Cath     Status: Abnormal   Collection Time: 08/12/20  1:39 PM  Result Value Ref Range   Color, Urine YELLOW YELLOW   APPearance CLEAR CLEAR   Specific Gravity, Urine 1.015 1.005 -  1.030   pH 8.0 5.0 - 8.0   Glucose, UA NEGATIVE NEGATIVE mg/dL   Hgb urine dipstick NEGATIVE NEGATIVE   Bilirubin Urine NEGATIVE NEGATIVE   Ketones, ur 5 (A) NEGATIVE mg/dL   Protein, ur NEGATIVE NEGATIVE mg/dL   Nitrite NEGATIVE NEGATIVE   Leukocytes,Ua NEGATIVE NEGATIVE    Comment: Performed at [facility], [address], [place], [place] [address]  Comprehensive metabolic panel     Status: Abnormal   Collection Time: 08/12/20  2:03 PM  Result Value Ref Range   Sodium 135 135 - 145 mmol/L   Potassium 3.9 3.5 - 5.1 mmol/L   Chloride 98 98 - 111 mmol/L   CO2 30 22 - 32 mmol/L   Glucose, Bld 96 70 - 99 mg/dL    Comment: Glucose reference range applies only to samples taken after fasting for at least 8 hours.   BUN 16 8 - 23 mg/dL   Creatinine, Ser 0.68 0.44 - 1.00 mg/dL   Calcium 9.4 8.9 - 10.3 mg/dL   Total Protein 7.0 6.5 - 8.1 g/dL   Albumin 3.2 (L) 3.5 - 5.0 g/dL   AST 15 15 - 41 U/L   ALT 10 0 - 44 U/L   Alkaline Phosphatase 70 38 - 126 U/L   Total Bilirubin 0.4 0.3 - 1.2 mg/dL   GFR, Estimated >60 >60 mL/min    Comment: (NOTE) Calculated using the CKD-EPI Creatinine Equation (2021)    Anion gap 7 5 - 15    Comment: Performed at [facility], [address], [place], [place] [address]  Lactic acid, plasma     Status: None   Collection Time: 08/12/20  2:03 PM  Result Value Ref Range   Lactic Acid, Venous 1.3 0.5 - 1.9 mmol/L    Comment: Performed at [facility], [address] [place], [place] [address]  CBC with Differential     Status: Abnormal   Collection Time: 08/12/20  2:03 PM  Result Value Ref Range   WBC 8.1 4.0 - 10.5 K/uL   RBC 3.86 (L) 3.87 - 5.11 MIL/uL   Hemoglobin 11.9 (L) 12.0 - 15.0 g/dL   HCT 37.3 36.0 - 46.0 %   MCV 96.6 80.0 - 100.0 fL   MCH 30.8 26.0 - 34.0 pg   MCHC 31.9 30.0 - 36.0 g/dL   RDW 15.4 11.5 - 15.5 %   Platelets 160 150 - 400 K/uL   nRBC 0.0 0.0 - 0.2 %   Neutrophils Relative % 77 %   Neutro Abs 6.1 1.7 - 7.7 K/uL    Lymphocytes Relative 15 %   Lymphs Abs 1.3 0.7 - 4.0 K/uL   Monocytes Relative 6 %   Monocytes Absolute 0.5 0.1 - 1.0 K/uL   Eosinophils Relative 2 %   Eosinophils Absolute 0.2 0.0 - 0.5 K/uL   Basophils Relative 0 %   Basophils Absolute 0.0 0.0 - 0.1 K/uL   Immature Granulocytes 0 %   Abs Immature Granulocytes  0.03 0.00 - 0.07 K/uL    Comment: Performed at [facility], [address] [place], [place] [address]  Protime-INR     Status: None   Collection Time: 08/12/20  2:03 PM  Result Value Ref Range   Prothrombin Time 13.2 11.4 - 15.2 seconds   INR 1.0 0.8 - 1.2    Comment: (NOTE) INR goal varies based on device and disease states. Performed at [facility], [address], [place], [place] [address]   Culture, blood (Routine x 2)     Status: None (Preliminary result)   Collection Time: 08/12/20  2:03 PM   Specimen: BLOOD  Result Value Ref Range   Specimen Description BLOOD RIGHT ARM    Special Requests      BOTTLES DRAWN AEROBIC AND ANAEROBIC Blood Culture adequate volume Performed at [facility], [address], [place], [place] [address]    Culture PENDING    Report Status PENDING   Culture, blood (Routine x 2)     Status: None (Preliminary result)   Collection Time: 08/12/20  2:03 PM   Specimen: BLOOD  Result Value Ref Range   Specimen Description BLOOD RIGHT WRIST    Special Requests      BOTTLES DRAWN AEROBIC AND ANAEROBIC Blood Culture adequate volume Performed at [facility], [address], [place], [place] [address]    Culture PENDING    Report Status PENDING   Procalcitonin     Status: None   Collection Time: 08/12/20  2:03 PM  Result Value Ref Range   Procalcitonin <0.10 ng/mL    Comment:        Interpretation: PCT (Procalcitonin) <= 0.5 ng/mL: Systemic infection (sepsis) is not likely. Local bacterial infection is possible. (NOTE)       Sepsis PCT Algorithm           Lower Respiratory Tract                                      Infection PCT  Algorithm    ----------------------------     ----------------------------         PCT < 0.25 ng/mL                PCT < 0.10 ng/mL          Strongly encourage             Strongly discourage   discontinuation of antibiotics    initiation of antibiotics    ----------------------------     -----------------------------       PCT 0.25 - 0.50 ng/mL            PCT 0.10 - 0.25 ng/mL               OR       >80% decrease in PCT            Discourage initiation of                                            antibiotics      Encourage discontinuation           of antibiotics    ----------------------------     -----------------------------         PCT >= 0.50 ng/mL  PCT 0.26 - 0.50 ng/mL               AND        <80% decrease in PCT             Encourage initiation of                                             antibiotics       Encourage continuation           of antibiotics    ----------------------------     -----------------------------        PCT >= 0.50 ng/mL                  PCT > 0.50 ng/mL               AND         increase in PCT                  Strongly encourage                                      initiation of antibiotics    Strongly encourage escalation           of antibiotics                                     -----------------------------                                           PCT <= 0.25 ng/mL                                                 OR                                        > 80% decrease in PCT                                      Discontinue / Do not initiate                                             antibiotics  Performed at [facility], [address] [place], [place] [address]   Magnesium     Status: Abnormal   Collection Time: 08/12/20  2:03 PM  Result Value Ref Range   Magnesium 1.5 (L) 1.7 - 2.4 mg/dL    Comment: Performed at [facility], [address], [place], [place] [address]  Resp Panel by RT-PCR (Flu A&B, Covid)  Nasopharyngeal Swab     Status: None   Collection Time: 08/12/20  2:05  PM   Specimen: Nasopharyngeal Swab; Nasopharyngeal(NP) swabs in vial transport medium  Result Value Ref Range   SARS Coronavirus 2 by RT PCR NEGATIVE NEGATIVE    Comment: (NOTE) SARS-CoV-2 target nucleic acids are NOT DETECTED.  The SARS-CoV-2 RNA is generally detectable in upper respiratory specimens during the acute phase of infection. The lowest concentration of SARS-CoV-2 viral copies this assay can detect is 138 copies/mL. A negative result does not preclude SARS-Cov-2 infection and should not be used as the sole basis for treatment or other patient management decisions. A negative result may occur with  improper specimen collection/handling, submission of specimen other than nasopharyngeal swab, presence of viral mutation(s) within the areas targeted by this assay, and inadequate number of viral copies(<138 copies/mL). A negative result must be combined with clinical observations, patient history, and epidemiological information. The expected result is Negative.  Fact Sheet for Patients:  EntrepreneurPulse.com.au  Fact Sheet for Healthcare Providers:  IncredibleEmployment.be  This test is no t yet approved or cleared by the [place] FDA and  has been authorized for detection and/or diagnosis of SARS-CoV-2 by FDA under an Emergency Use Authorization (EUA). This EUA will remain  in effect (meaning this test can be used) for the duration of the COVID-19 declaration under Section 564(b)(1) of the Act, 21 U.S.C.section 360bbb-3(b)(1), unless the authorization is terminated  or revoked sooner.       Influenza A by PCR NEGATIVE NEGATIVE   Influenza B by PCR NEGATIVE NEGATIVE    Comment: (NOTE) The Xpert Xpress SARS-CoV-2/FLU/RSV plus assay is intended as an aid in the diagnosis of influenza from Nasopharyngeal swab specimens and should not be used as a sole basis for  treatment. Nasal washings and aspirates are unacceptable for Xpert Xpress SARS-CoV-2/FLU/RSV testing.  Fact Sheet for Patients: EntrepreneurPulse.com.au  Fact Sheet for Healthcare Providers: IncredibleEmployment.be  This test is not yet approved or cleared by the [place] FDA and has been authorized for detection and/or diagnosis of SARS-CoV-2 by FDA under an Emergency Use Authorization (EUA). This EUA will remain in effect (meaning this test can be used) for the duration of the COVID-19 declaration under Section 564(b)(1) of the Act, 21 U.S.C. section 360bbb-3(b)(1), unless the authorization is terminated or revoked.  Performed at [facility], [address], [place], [place] [address]   Lactic acid, plasma     Status: None   Collection Time: 08/12/20  4:04 PM  Result Value Ref Range   Lactic Acid, Venous 0.6 0.5 - 1.9 mmol/L    Comment: Performed at [facility], [address] [place], [place] [address]   DG Chest 1 View  Result Date: 08/12/2020 CLINICAL DATA:  Fever.  Altered mental status. EXAM: CHEST  1 VIEW COMPARISON:  07/25/2020 FINDINGS: The heart size and mediastinal contours are within normal limits. Aortic atherosclerotic calcification noted. Both lungs are clear. The visualized skeletal structures are unremarkable. IMPRESSION: No active disease. Electronically Signed   By: Marlaine Hind M.D.   On: 08/12/2020 15:04   DG Ankle Complete Left  Result Date: 08/11/2020 CLINICAL DATA:  Left foot and ankle pain after falling. EXAM: LEFT ANKLE COMPLETE - 3+ VIEW COMPARISON:  Left foot 08/11/2020 FINDINGS: Negative for fracture or dislocation. Normal alignment at the left ankle. No focal soft tissue abnormality. IMPRESSION: No acute bone abnormality to the left ankle. Electronically Signed   By: Markus Daft M.D.   On: 08/11/2020 14:06   DG Ankle Complete Right  Result Date: 08/11/2020 CLINICAL DATA:  Right  foot and ankle pain after a fall.  Initial encounter. EXAM: RIGHT ANKLE - COMPLETE 3+ VIEW COMPARISON:  Plain films right foot 07/22/2020. FINDINGS: There is no evidence of fracture, dislocation, or joint effusion. There is no evidence of arthropathy or other focal bone abnormality. Soft tissues are unremarkable. IMPRESSION: Negative exam. Electronically Signed   By: Inge Rise M.D.   On: 08/11/2020 16:00   CT Head Wo Contrast  Result Date: 08/12/2020 CLINICAL DATA:  Altered mental status. Fever. EXAM: CT HEAD WITHOUT CONTRAST TECHNIQUE: Contiguous axial images were obtained from the base of the skull through the vertex without intravenous contrast. COMPARISON:  07/25/2020 FINDINGS: Brain: No evidence of acute infarction, hemorrhage, hydrocephalus, extra-axial collection, or mass lesion/mass effect. Stable encephalomalacia involving the anterior right temporal lobe. Moderate central cerebral atrophy and ventriculomegaly remain stable. Moderate chronic small vessel disease is also unchanged in appearance. Vascular:  No hyperdense vessel or other acute findings. Skull: No evidence of fracture or other significant bone abnormality. Sinuses/Orbits:  No acute findings. Other: None. IMPRESSION: No acute intracranial abnormality. Stable right temporal lobe encephalomalacia. Stable cerebral atrophy and chronic small vessel disease. Electronically Signed   By: Marlaine Hind M.D.   On: 08/12/2020 15:19   DG Foot Complete Left  Result Date: 08/11/2020 CLINICAL DATA:  Fall.  Complaints of left foot and ankle pain. EXAM: LEFT FOOT - COMPLETE 3+ VIEW COMPARISON:  Left ankle 08/11/2020 FINDINGS: No evidence for an acute fracture or dislocation. Mild cortical irregularity involving the little toe proximal phalanx along the distal aspect. Suspect this is an old injury. Normal alignment of the left foot. IMPRESSION: 1. No acute abnormality involving the left foot. 2. Mild irregularity involving little toe proximal phalanx as described. Suspect this is an  old injury but consider clinical correlation in this area. Electronically Signed   By: Markus Daft M.D.   On: 08/11/2020 14:04   DG Foot Complete Right  Result Date: 08/11/2020 CLINICAL DATA:  Right foot and ankle pain after a fall. Initial encounter. EXAM: RIGHT FOOT COMPLETE - 3+ VIEW COMPARISON:  Plain films right ankle 07/22/2020. FINDINGS: There is no evidence of fracture or dislocation. There is no evidence of arthropathy or other focal bone abnormality. Soft tissues are unremarkable. IMPRESSION: Negative exam. Electronically Signed   By: Inge Rise M.D.   On: 08/11/2020 15:59    Pending Labs Unresulted Labs (From admission, onward)    Start     Ordered   08/19/20 0500  Creatinine, serum  (enoxaparin (LOVENOX)    CrCl >/= 30 ml/min)  Weekly,   R     Comments: while on enoxaparin therapy    08/12/20 1741   08/13/20 0500  CBC with Differential  Tomorrow morning,   R        08/12/20 1743   08/13/20 [id number]  Basic metabolic panel  Daily,   R      08/12/20 1744   08/12/20 1744  Procalcitonin  Daily,   R     Comments: May add on to previous collection   08/12/20 1743          Vitals/Pain Today's Vitals   08/12/20 1800 08/12/20 1830 08/12/20 1900 08/12/20 1943  BP: 139/78 (!) 139/92 (!) 149/111   Pulse: 84 89 88   Resp: (!) 23 (!) 25 (!) 21   Temp:    100.3 F (37.9 C)  TempSrc:    Rectal  SpO2: 94% 96% 98%   Weight:      Height:  PainSc:        Isolation Precautions Airborne and Contact precautions  Medications Medications  enoxaparin (LOVENOX) injection 40 mg (40 mg Subcutaneous Given 08/12/20 1947)  acetaminophen (TYLENOL) tablet 650 mg (has no administration in time range)    Or  acetaminophen (TYLENOL) suppository 650 mg (has no administration in time range)  ondansetron (ZOFRAN) tablet 4 mg (has no administration in time range)    Or  ondansetron (ZOFRAN) injection 4 mg (has no administration in time range)  metroNIDAZOLE (FLAGYL) IVPB 500 mg (has no  administration in time range)  ceFEPIme (MAXIPIME) 2 g in sodium chloride 0.9 % 100 mL IVPB (has no administration in time range)  vancomycin (VANCOCIN) IVPB 1000 mg/200 mL premix (has no administration in time range)  magnesium sulfate IVPB 2 g 50 mL (has no administration in time range)  acetaminophen (TYLENOL) suppository 650 mg (650 mg Rectal Given 08/12/20 1418)  sodium chloride 0.9 % bolus 1,000 mL (0 mLs Intravenous Stopped 08/12/20 1538)  cefTRIAXone (ROCEPHIN) 1 g in sodium chloride 0.9 % 100 mL IVPB (0 g Intravenous Stopped 08/12/20 1538)  sodium chloride 0.9 % bolus 1,000 mL (0 mLs Intravenous Stopped 08/12/20 1943)  ceFEPIme (MAXIPIME) 2 g in sodium chloride 0.9 % 100 mL IVPB (0 g Intravenous Stopped 08/12/20 1911)  metroNIDAZOLE (FLAGYL) IVPB 500 mg (0 mg Intravenous Stopped 08/12/20 1943)  vancomycin (VANCOCIN) IVPB 1000 mg/200 mL premix (1,000 mg Intravenous New Bag/Given 08/12/20 1952)    Mobility  High fall risk   Focused Assessments    R Recommendations: See Admitting Provider Note  Report given to:   Additional Notes:

## 2020-08-12 NOTE — ED Provider Notes (Signed)
[facility] Provider Note   CSN: [id number] Arrival date & time: 08/12/20  1311     History Chief Complaint  Patient presents with   Altered Mental Status    Cassidy Smith is a 65 y.o. female.  HPI  65 year old female with a history of anxiety, bipolar disorder, chronic hip pain, CKD, depression, headache, hypothyroidism, ischemic stroke, neuromuscular disorder, SIADH, who presents to the emergency department today for evaluation of altered mental status.  Per EMS, patient was seen here in the ED yesterday and was completely different.  Today she is more somnolent and unable to answer questions appropriately.  There is a level 5 caveat due to patient's altered mental status.   3:42 PM Attempted to contact pts son, Cassidy Smith and was sent to voicemail.   Past Medical History:  Diagnosis Date   Anxiety    Bipolar 1 disorder ([place])    Chronic hip pain    Chronic kidney disease    uti  currently,  hx bladder spasms   Depression    Headache(784.0)    Hyperthyroidism    Ischemic stroke (HCC)    Neuromuscular disorder (HCC)    shaking of hands    SIADH (syndrome of inappropriate ADH production) ([place]) 04/08/2017    Patient Active Problem List   Diagnosis Date Noted   SIRS (systemic inflammatory response syndrome) ([place]) 08/12/2020   Sepsis ([place]) 07/25/2020   Sepsis with acute hypoxic respiratory failure without septic shock (HCC)    Dementia without behavioral disturbance ([place])    Palliative care by specialist    Goals of care, counseling/discussion    Pneumonia of right lower lobe due to infectious organism 11/06/2019   Intracerebral hematoma ([place]) 07/04/2019   Closed head injury 07/03/2019   Thrombocytopenia ([place]) 07/03/2019   UTI (urinary tract infection) 01/04/2018   Neuromuscular disorder ([place])    Dysuria 10/30/2017   Fever 08/15/2017   Hypokalemia 08/15/2017   Altered mental state 123456   Acute metabolic encephalopathy 123456    Valproic acid toxicity 08/15/2017   Altered mental status    Ischemic stroke ([place])    GERD (gastroesophageal reflux disease) 05/12/2017   GI bleed    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   Dysplastic rectal polyp    Protein-calorie malnutrition, severe 04/07/2017   Pressure injury of skin 04/04/2017   Anticoagulated 04/03/2017   Stool bloody 04/03/2017   Current every day smoker 04/03/2017   Rectal bleeding    Chronic diarrhea    Hyperthyroidism    S/P ORIF (open reduction internal fixation) fracture right hip IM nail 03/07/17 03/24/2017   Hypomagnesemia 03/07/2017   Fall    Closed intertrochanteric fracture of hip, right, initial encounter ([place]) 03/06/2017   Radial styloid fracture: right 03/06/2017   Anemia 03/06/2017   Orthostatic hypotension 06/07/2015   Lower urinary tract infectious disease 06/07/2015   Rhabdomyolysis 06/07/2015   White matter abnormality on MRI of brain 05/14/2013   History of depression 05/14/2013   Hx of anxiety disorder 05/14/2013   Bipolar I disorder, most recent episode (or current) manic, unspecified 05/14/2013   Bipolar 1 disorder ([place]) 05/14/2013   Hyponatremia 05/12/2013   Bipolar disorder ([place]) 05/12/2013   Lacunar infarct, acute ([place]) 05/12/2013    Past Surgical History:  Procedure Laterality Date   COLONOSCOPY WITH PROPOFOL N/A 04/05/2017   Procedure: COLONOSCOPY WITH PROPOFOL;  Surgeon: Rogene Houston, MD;  Location[facility];  Service: Endoscopy;  Laterality: N/A;   INTRAMEDULLARY (  IM) NAIL INTERTROCHANTERIC Right 03/07/2017   Procedure: OPEN TREATMENT INTERNAL FIXATION RIGHT HIP WITH GAMA INTRAMEDULARY NAIL;  Surgeon: Carole Civil, MD;  Location: [facility];  Service: Orthopedics;  Laterality: Right;   MULTIPLE EXTRACTIONS WITH ALVEOLOPLASTY N/A 10/30/2012   Procedure: MULTIPLE EXTRACION 5, 6, 8, 9, 10 ,18, 19, 31 WITH MAXILLARY RIGHT AND LEFT  ALVEOLOPLASTY REDUCE MAXILLARY LEFT TUBEROSITY;  Surgeon:  Gae Bon, DDS;  Location: [facility];  Service: Oral Surgery;  Laterality: N/A;   POLYPECTOMY  04/05/2017   Procedure: POLYPECTOMY;  Surgeon: Rogene Houston, MD;  Location: [facility];  Service: Endoscopy;;  recto-sigmoid, rectum     OB History   No obstetric history on file.     Family History  Problem Relation Age of Onset   Breast cancer Mother    Cancer - Other Mother    Alcoholism Father    Colon cancer Neg Hx    Colon polyps Neg Hx     Social History   Tobacco Use   Smoking status: Former    Packs/day: 0.50    Years: 20.00    Pack years: 10.00    Types: Cigarettes    Quit date: 05/05/2017    Years since quitting: 3.2   Smokeless tobacco: Never   Tobacco comments:    patient states she quit one week ago  Vaping Use   Vaping Use: Never used  Substance Use Topics   Alcohol use: No   Drug use: No    Home Medications Prior to Admission medications   Medication Sig Start Date End Date Taking? Authorizing Provider  aspirin EC 81 MG tablet Take 81 mg by mouth daily. Swallow whole.   Yes [provider]  atorvastatin (LIPITOR) 20 MG tablet Take 1 tablet (20 mg total) by mouth daily at 6 PM. 06/11/17  Yes Barton Dubois, MD  benztropine (COGENTIN) 1 MG tablet Take 1 tablet (1 mg total) by mouth 2 (two) times daily. 01/05/18  Yes Johnson, Clanford L, MD  Cholecalciferol (VITAMIN D-3) 125 MCG (5000 UT) TABS Take 5,000 Units by mouth daily.   Yes [provider]  divalproex (DEPAKOTE ER) 500 MG 24 hr tablet Take 2 tablets (1,000 mg total) by mouth at bedtime. 08/16/17  Yes Tat, Shanon Brow, MD  furosemide (LASIX) 20 MG tablet Take 10 mg by mouth daily. 07/06/20  Yes [provider]  gabapentin (NEURONTIN) 100 MG capsule Take 1 capsule (100 mg total) by mouth 3 (three) times daily. 05/13/17  Yes Carole Civil, MD  methimazole (TAPAZOLE) 10 MG tablet Take 10 mg by mouth every other day. 07/06/20  Yes [provider]  Multiple Vitamin  (MULTIVITAMIN WITH MINERALS) TABS tablet Take 1 tablet by mouth daily. Centrum Silver   Yes [provider]  pantoprazole (PROTONIX) 40 MG tablet Take 40 mg by mouth daily.   Yes [provider]  potassium chloride (K-DUR,KLOR-CON) 10 MEQ tablet Take 1 tablet (10 mEq total) by mouth every other day. 01/08/18  Yes Johnson, Clanford L, MD  risperiDONE (RISPERDAL) 2 MG tablet Take 2 mg by mouth at bedtime. 06/21/19  Yes [provider]  traZODone (DESYREL) 100 MG tablet Take 200 mg by mouth at bedtime.   Yes [provider]  vitamin B-12 (CYANOCOBALAMIN) 1000 MCG tablet Take 1 tablet (1,000 mcg total) by mouth daily. 06/11/17  Yes Barton Dubois, MD    Allergies    Patient has no known allergies.  Review of Systems   Review  of Systems  Unable to perform ROS: Mental status change   Physical Exam Updated Vital Signs BP 135/76   Pulse 81   Temp (!) 100.9 F (38.3 C) (Rectal)   Resp 20   Ht '5\' 4"'$  (1.626 m)   Wt 54.4 kg   SpO2 97%   BMI 20.59 kg/m   Physical Exam Vitals and nursing note reviewed.  Constitutional:      General: She is not in acute distress.    Appearance: She is well-developed. She is ill-appearing.  HENT:     Head: Normocephalic and atraumatic.     Mouth/Throat:     Mouth: Mucous membranes are dry.  Eyes:     Conjunctiva/sclera: Conjunctivae normal.  Cardiovascular:     Rate and Rhythm: Normal rate and regular rhythm.     Heart sounds: Normal heart sounds. No murmur heard. Pulmonary:     Effort: Pulmonary effort is normal. No respiratory distress.     Breath sounds: Normal breath sounds.  Abdominal:     General: Bowel sounds are normal.     Palpations: Abdomen is soft.     Tenderness: There is no abdominal tenderness. There is no guarding or rebound.  Musculoskeletal:     Cervical back: Neck supple.  Skin:    General: Skin is warm and dry.  Neurological:     Mental Status: She is alert.     Comments: Somnolent, withdraws  to verbal stimuli    ED Results / Procedures / Treatments   Labs (all labs ordered are listed, but only abnormal results are displayed) Labs Reviewed  COMPREHENSIVE METABOLIC PANEL - Abnormal; Notable for the following components:      Result Value   Albumin 3.2 (*)    All other components within normal limits  CBC WITH DIFFERENTIAL/PLATELET - Abnormal; Notable for the following components:   RBC 3.86 (*)    Hemoglobin 11.9 (*)    All other components within normal limits  URINALYSIS, ROUTINE W REFLEX MICROSCOPIC - Abnormal; Notable for the following components:   Ketones, ur 5 (*)    All other components within normal limits  CULTURE, BLOOD (ROUTINE X 2)  CULTURE, BLOOD (ROUTINE X 2)  RESP PANEL BY RT-PCR (FLU A&B, COVID) ARPGX2  LACTIC ACID, PLASMA  LACTIC ACID, PLASMA  PROTIME-INR    EKG None  Radiology DG Chest 1 View  Result Date: 08/12/2020 CLINICAL DATA:  Fever.  Altered mental status. EXAM: CHEST  1 VIEW COMPARISON:  07/25/2020 FINDINGS: The heart size and mediastinal contours are within normal limits. Aortic atherosclerotic calcification noted. Both lungs are clear. The visualized skeletal structures are unremarkable. IMPRESSION: No active disease. Electronically Signed   By: Marlaine Hind M.D.   On: 08/12/2020 15:04   DG Ankle Complete Left  Result Date: 08/11/2020 CLINICAL DATA:  Left foot and ankle pain after falling. EXAM: LEFT ANKLE COMPLETE - 3+ VIEW COMPARISON:  Left foot 08/11/2020 FINDINGS: Negative for fracture or dislocation. Normal alignment at the left ankle. No focal soft tissue abnormality. IMPRESSION: No acute bone abnormality to the left ankle. Electronically Signed   By: Markus Daft M.D.   On: 08/11/2020 14:06   DG Ankle Complete Right  Result Date: 08/11/2020 CLINICAL DATA:  Right foot and ankle pain after a fall. Initial encounter. EXAM: RIGHT ANKLE - COMPLETE 3+ VIEW COMPARISON:  Plain films right foot 07/22/2020. FINDINGS: There is no evidence of  fracture, dislocation, or joint effusion. There is no evidence of arthropathy or other focal  bone abnormality. Soft tissues are unremarkable. IMPRESSION: Negative exam. Electronically Signed   By: Inge Rise M.D.   On: 08/11/2020 16:00   CT Head Wo Contrast  Result Date: 08/12/2020 CLINICAL DATA:  Altered mental status. Fever. EXAM: CT HEAD WITHOUT CONTRAST TECHNIQUE: Contiguous axial images were obtained from the base of the skull through the vertex without intravenous contrast. COMPARISON:  07/25/2020 FINDINGS: Brain: No evidence of acute infarction, hemorrhage, hydrocephalus, extra-axial collection, or mass lesion/mass effect. Stable encephalomalacia involving the anterior right temporal lobe. Moderate central cerebral atrophy and ventriculomegaly remain stable. Moderate chronic small vessel disease is also unchanged in appearance. Vascular:  No hyperdense vessel or other acute findings. Skull: No evidence of fracture or other significant bone abnormality. Sinuses/Orbits:  No acute findings. Other: None. IMPRESSION: No acute intracranial abnormality. Stable right temporal lobe encephalomalacia. Stable cerebral atrophy and chronic small vessel disease. Electronically Signed   By: Marlaine Hind M.D.   On: 08/12/2020 15:19   DG Foot Complete Left  Result Date: 08/11/2020 CLINICAL DATA:  Fall.  Complaints of left foot and ankle pain. EXAM: LEFT FOOT - COMPLETE 3+ VIEW COMPARISON:  Left ankle 08/11/2020 FINDINGS: No evidence for an acute fracture or dislocation. Mild cortical irregularity involving the little toe proximal phalanx along the distal aspect. Suspect this is an old injury. Normal alignment of the left foot. IMPRESSION: 1. No acute abnormality involving the left foot. 2. Mild irregularity involving little toe proximal phalanx as described. Suspect this is an old injury but consider clinical correlation in this area. Electronically Signed   By: Markus Daft M.D.   On: 08/11/2020 14:04   DG Foot  Complete Right  Result Date: 08/11/2020 CLINICAL DATA:  Right foot and ankle pain after a fall. Initial encounter. EXAM: RIGHT FOOT COMPLETE - 3+ VIEW COMPARISON:  Plain films right ankle 07/22/2020. FINDINGS: There is no evidence of fracture or dislocation. There is no evidence of arthropathy or other focal bone abnormality. Soft tissues are unremarkable. IMPRESSION: Negative exam. Electronically Signed   By: Inge Rise M.D.   On: 08/11/2020 15:59    Procedures Procedures   Medications Ordered in ED Medications  sodium chloride 0.9 % bolus 1,000 mL (has no administration in time range)  ceFEPIme (MAXIPIME) 2 g in sodium chloride 0.9 % 100 mL IVPB (has no administration in time range)  metroNIDAZOLE (FLAGYL) IVPB 500 mg (has no administration in time range)  vancomycin (VANCOCIN) IVPB 1000 mg/200 mL premix (has no administration in time range)  acetaminophen (TYLENOL) suppository 650 mg (650 mg Rectal Given 08/12/20 1418)  sodium chloride 0.9 % bolus 1,000 mL (0 mLs Intravenous Stopped 08/12/20 1538)  cefTRIAXone (ROCEPHIN) 1 g in sodium chloride 0.9 % 100 mL IVPB (0 g Intravenous Stopped 08/12/20 1538)    ED Course  I have reviewed the triage vital signs and the nursing notes.  Pertinent labs & imaging results that were available during my care of the patient were reviewed by me and considered in my medical decision making (see chart for details).    MDM Rules/Calculators/A&P                          65 year old female presents to the emergency department today for evaluation of altered mental status.  On arrival she is noted to be febrile, tachypneic.  She was seen in the ED yesterday for evaluation of foot pain and had negative x-rays.  Reviewed/interpreted labs CBC w/o leukocytosis, mild anemia  present which appears consistent with previous CMP unremarkable Lactic acid neg Coags neg Blood cultures obtained UA does not appear consistent with uti COVID neg  EKG with nsr,  short pr interval, right atrial enlargement, nonspecific t abnormalities in the lateral leads,  Reviewed/interpreted imaging CT head - No acute intracranial abnormality. Stable right temporal lobe encephalomalacia. Stable cerebral atrophy and chronic small vessel disease. CXR - neg for pna, ptx or other cardiopulmonary abnormality  Pt here with ams and fever of unknown origin. She was started on broad spectrum abx. Feel she will require admission for further tx and to await blood culture results.  5:28 PM CONSULT with Dr. Olevia Bowens who accepts patient for admission   Final Clinical Impression(s) / ED Diagnoses Final diagnoses:  AMS (altered mental status)    Rx / DC Orders ED Discharge Orders     None        Bishop Dublin 08/12/20 1730    Milton Ferguson, MD 08/14/20 1037

## 2020-08-12 NOTE — ED Notes (Signed)
Pt. To CT

## 2020-08-12 NOTE — ED Triage Notes (Signed)
Pt brought in from home by [facility] with c/o AMS and fever. Temp 101, BP 123/63, H 87 for EMS. Pt here a few weeks ago for UTI and was admitted for IV antibiotics. Pt does have dementia but is altered much more than normal.

## 2020-08-12 NOTE — ED Notes (Signed)
Bladder scanned pt. Pt. Had 63 mls in bladder.

## 2020-08-12 NOTE — Progress Notes (Signed)
Pharmacy Antibiotic Note  Cassidy Smith is a 65 y.o. female admitted on 08/12/2020 with sepsis.  Pharmacy has been consulted for vancomycin and cefepime dosing.  WBC 8.1, LA 1.3> 0.6, Scr 0.68 (CrCl 58 mL/min). Tmax 100.9. Presented with AMS.  Plan: Cefepime 2g IV every 12 hours Vancomycin 1g IV every 24 hours (estAUC 481)  Monitor renal fx, cx results, clinical pic, vanc levels  Height: '5\' 4"'$  (162.6 cm) Weight: 54.4 kg (119 lb 14.9 oz) IBW/kg (Calculated) : 54.7  Temp (24hrs), Avg:101.7 F (38.7 C), Min:100.9 F (38.3 C), Max:102.5 F (39.2 C)  Recent Labs  Lab 08/12/20 1403 08/12/20 1604  WBC 8.1  --   CREATININE 0.68  --   LATICACIDVEN 1.3 0.6    Estimated Creatinine Clearance: 58.6 mL/min (by C-G formula based on SCr of 0.68 mg/dL).    No Known Allergies  Antimicrobials this admission: Vancomycin 7/30 >>  Cefepime 7/30 >>   Dose adjustments this admission: N/A  Microbiology results: 7/30 BCx: pending 7/30 COVID PCR: neg  Thank you for allowing pharmacy to be a part of this patient's care.  Margot Ables, PharmD Clinical Pharmacist 08/12/2020 7:13 PM

## 2020-08-12 NOTE — ED Notes (Signed)
Dr. Roderic Palau notified of possible sepsis pt.

## 2020-08-12 NOTE — H&P (Signed)
History and Physical    Cassidy Smith [id number] DOB: 28-Jun-1955 DOA: 08/12/2020  PCP: Neale Burly, MD  Patient coming from: Home.  I have personally briefly reviewed patient's old medical records in [place]  Chief Complaint: Altered mental status.  HPI: Cassidy Smith is a 65 y.o. female with medical history significant of anxiety, depression, bipolar 1 disorder, chronic DVT history, chronic hip pain, history of unspecified GI bleed, history of UTIs, headache, hypothyroidism with history of thyrotoxicosis, previous episode of hypokalemia, history of intracerebral hematoma, history of other known hemorrhagic stroke, history of RLL pneumonia, rhabdomyolysis, SIADH who is brought to the emergency department via EMS due to fever with altered mental status.  She was admitted for 3 days and discharged 2 weeks ago for severe sepsis due to ESBL E. coli UTI.  Her mental status improved after fluids and IV antibiotics.  She knows she is currently at [facility], but is disoriented to time and situation.  She answers simple questions.  Denies headache, back, chest or abdominal pain.  She is unable to provide further information.  EDP and myself have been unable to contact family members so far.  ED Course: Initial vital signs were temperature 102.5 F, pulse 83, respirations 25, BP 145/74 mmHg O2 sat 96% on room air.  The patient received 2000 mL of NS bolus, cefepime, vancomycin and metronidazole.  Lab work: Her urinalysis only showed ketonuria 5 mg/dL, but was otherwise negative.  Blood cultures x2 were drawn.  CBC showed a white count of 8.1, hemoglobin 11.9 g/dL and platelets 160.  PT was 13.2 and INR 1.0.  CMP only remarkable for an albumin of 3.2 g/dL, but all other values were within expected range. Lactic acid was normal twice.  Imaging: CT head did not show any acute intracranial normality.  1 view chest radiograph did not show any active disease.  Bilateral feet and ankle radiographs  done yesterday for an unrelated this is due to a fall earlier this week were negative.  Please see images and full radiology report for further detail.  Review of Systems: As per HPI otherwise all other systems reviewed and are negative.  Past Medical History:  Diagnosis Date   Anxiety    Bipolar 1 disorder ([place])    Chronic deep vein thrombosis (DVT) of distal vein of lower extremity ([place]) 07/10/2017   Chronic hip pain    Depression    GI bleed    Headache(784.0)    History of UTI/bladder spams.    Hyperthyroidism    Hypokalemia 08/15/2017   Intracerebral hematoma ([place]) 07/04/2019   Ischemic stroke (HCC)    Neuromuscular disorder (HCC)    shaking of hands    Pneumonia of right lower lobe due to infectious organism 11/06/2019   Radial styloid fracture: right 03/06/2017   Rhabdomyolysis 06/07/2015   SIADH (syndrome of inappropriate ADH production) ([place]) 04/08/2017   Thyrotoxicosis, unspecified with thyrotoxic crisis or storm 01/15/2019   Past Surgical History:  Procedure Laterality Date   COLONOSCOPY WITH PROPOFOL N/A 04/05/2017   Procedure: COLONOSCOPY WITH PROPOFOL;  Surgeon: Rogene Houston, MD;  [facility];  Service: Endoscopy;  Laterality: N/A;   INTRAMEDULLARY (IM) NAIL INTERTROCHANTERIC Right 03/07/2017   Procedure: OPEN TREATMENT INTERNAL FIXATION RIGHT HIP WITH GAMA INTRAMEDULARY NAIL;  Surgeon: Carole Civil, MD;  Location: [facility];  Service: Orthopedics;  Laterality: Right;   MULTIPLE EXTRACTIONS WITH ALVEOLOPLASTY N/A 10/30/2012   Procedure: MULTIPLE EXTRACION 5, 6, 8, 9, 10 ,  18, 19, 31 WITH MAXILLARY RIGHT AND LEFT  ALVEOLOPLASTY REDUCE MAXILLARY LEFT TUBEROSITY;  Surgeon: Gae Bon, DDS;  Location: [place];  Service: Oral Surgery;  Laterality: N/A;   POLYPECTOMY  04/05/2017   Procedure: POLYPECTOMY;  Surgeon: Rogene Houston, MD;  Location: [facility];  Service: Endoscopy;;  recto-sigmoid, rectum   Social History  reports that she quit smoking  about 3 years ago. Her smoking use included cigarettes. She has a 10.00 pack-year smoking history. She has never used smokeless tobacco. She reports that she does not drink alcohol and does not use drugs.  No Known Allergies  Family History  Problem Relation Age of Onset   Breast cancer Mother    Cancer - Other Mother    Alcoholism Father    Colon cancer Neg Hx    Colon polyps Neg Hx    Prior to Admission medications   Medication Sig Start Date End Date Taking? Authorizing Provider  aspirin EC 81 MG tablet Take 81 mg by mouth daily. Swallow whole.   Yes [provider]  atorvastatin (LIPITOR) 20 MG tablet Take 1 tablet (20 mg total) by mouth daily at 6 PM. 06/11/17  Yes Barton Dubois, MD  benztropine (COGENTIN) 1 MG tablet Take 1 tablet (1 mg total) by mouth 2 (two) times daily. 01/05/18  Yes Johnson, Clanford L, MD  Cholecalciferol (VITAMIN D-3) 125 MCG (5000 UT) TABS Take 5,000 Units by mouth daily.   Yes [provider]  divalproex (DEPAKOTE ER) 500 MG 24 hr tablet Take 2 tablets (1,000 mg total) by mouth at bedtime. 08/16/17  Yes Tat, Shanon Brow, MD  furosemide (LASIX) 20 MG tablet Take 10 mg by mouth daily. 07/06/20  Yes [provider]  gabapentin (NEURONTIN) 100 MG capsule Take 1 capsule (100 mg total) by mouth 3 (three) times daily. 05/13/17  Yes Carole Civil, MD  methimazole (TAPAZOLE) 10 MG tablet Take 10 mg by mouth every other day. 07/06/20  Yes [provider]  Multiple Vitamin (MULTIVITAMIN WITH MINERALS) TABS tablet Take 1 tablet by mouth daily. Centrum Silver   Yes [provider]  pantoprazole (PROTONIX) 40 MG tablet Take 40 mg by mouth daily.   Yes [provider]  potassium chloride (K-DUR,KLOR-CON) 10 MEQ tablet Take 1 tablet (10 mEq total) by mouth every other day. 01/08/18  Yes Johnson, Clanford L, MD  risperiDONE (RISPERDAL) 2 MG tablet Take 2 mg by mouth at bedtime. 06/21/19  Yes [provider]  traZODone  (DESYREL) 100 MG tablet Take 200 mg by mouth at bedtime.   Yes [provider]  vitamin B-12 (CYANOCOBALAMIN) 1000 MCG tablet Take 1 tablet (1,000 mcg total) by mouth daily. 06/11/17  Yes Barton Dubois, MD    Physical Exam: Vitals:   08/12/20 1546 08/12/20 1600 08/12/20 1630 08/12/20 1700  BP:  125/68 (!) 117/59 135/76  Pulse:  86 83 81  Resp:  (!) '27 19 20  '$ Temp: (!) 100.9 F (38.3 C)     TempSrc: Rectal     SpO2:  94% 94% 97%  Weight:      Height:        Constitutional: NAD, calm, comfortable Eyes: PERRL, lids and conjunctivae normal ENMT: Mucous membranes are mildly dry.  Posterior pharynx clear of any exudate or lesions. Neck: normal, supple, no masses, no thyromegaly Respiratory: Shallow inspiration, otherwise clear to auscultation bilaterally, no wheezing, no crackles. Normal respiratory effort. No accessory muscle use.  Cardiovascular: Regular rate and rhythm, no  murmurs / rubs / gallops. No extremity edema. 2+ pedal pulses. No carotid bruits.  Abdomen: No distention.  Bowel sounds positive.  Soft, no tenderness, no masses palpated. No hepatosplenomegaly.  Musculoskeletal: Mild generalized weakness.  No clubbing / cyanosis.  Good ROM, no contractures. Normal muscle tone.  Skin: no rashes, lesions, ulcers  Neurologic: CN 2-12 grossly intact. Sensation intact, DTR normal. Strength 5/5 in all 4.  Psychiatric: Alert and oriented x 2. Normal mood.   Labs on Admission: I have personally reviewed following labs and imaging studies  CBC: Recent Labs  Lab 08/12/20 1403  WBC 8.1  NEUTROABS 6.1  HGB 11.9*  HCT 37.3  MCV 96.6  PLT 0000000    Basic Metabolic Panel: Recent Labs  Lab 08/12/20 1403  NA 135  K 3.9  CL 98  CO2 30  GLUCOSE 96  BUN 16  CREATININE 0.68  CALCIUM 9.4    GFR: Estimated Creatinine Clearance: 58.6 mL/min (by C-G formula based on SCr of 0.68 mg/dL).  Liver Function Tests: Recent Labs  Lab 08/12/20 1403  AST 15  ALT 10  ALKPHOS 70   BILITOT 0.4  PROT 7.0  ALBUMIN 3.2*    Urine analysis:    Component Value Date/Time   COLORURINE YELLOW 08/12/2020 1339   APPEARANCEUR CLEAR 08/12/2020 1339   LABSPEC 1.015 08/12/2020 1339   PHURINE 8.0 08/12/2020 1339   GLUCOSEU NEGATIVE 08/12/2020 1339   HGBUR NEGATIVE 08/12/2020 1339   BILIRUBINUR NEGATIVE 08/12/2020 1339   KETONESUR 5 (A) 08/12/2020 1339   PROTEINUR NEGATIVE 08/12/2020 1339   UROBILINOGEN 0.2 05/12/2013 0210   NITRITE NEGATIVE 08/12/2020 1339   LEUKOCYTESUR NEGATIVE 08/12/2020 1339    Radiological Exams on Admission: DG Chest 1 View  Result Date: 08/12/2020 CLINICAL DATA:  Fever.  Altered mental status. EXAM: CHEST  1 VIEW COMPARISON:  07/25/2020 FINDINGS: The heart size and mediastinal contours are within normal limits. Aortic atherosclerotic calcification noted. Both lungs are clear. The visualized skeletal structures are unremarkable. IMPRESSION: No active disease. Electronically Signed   By: Marlaine Hind M.D.   On: 08/12/2020 15:04   DG Ankle Complete Left  Result Date: 08/11/2020 CLINICAL DATA:  Left foot and ankle pain after falling. EXAM: LEFT ANKLE COMPLETE - 3+ VIEW COMPARISON:  Left foot 08/11/2020 FINDINGS: Negative for fracture or dislocation. Normal alignment at the left ankle. No focal soft tissue abnormality. IMPRESSION: No acute bone abnormality to the left ankle. Electronically Signed   By: Markus Daft M.D.   On: 08/11/2020 14:06   DG Ankle Complete Right  Result Date: 08/11/2020 CLINICAL DATA:  Right foot and ankle pain after a fall. Initial encounter. EXAM: RIGHT ANKLE - COMPLETE 3+ VIEW COMPARISON:  Plain films right foot 07/22/2020. FINDINGS: There is no evidence of fracture, dislocation, or joint effusion. There is no evidence of arthropathy or other focal bone abnormality. Soft tissues are unremarkable. IMPRESSION: Negative exam. Electronically Signed   By: Inge Rise M.D.   On: 08/11/2020 16:00   CT Head Wo Contrast  Result  Date: 08/12/2020 CLINICAL DATA:  Altered mental status. Fever. EXAM: CT HEAD WITHOUT CONTRAST TECHNIQUE: Contiguous axial images were obtained from the base of the skull through the vertex without intravenous contrast. COMPARISON:  07/25/2020 FINDINGS: Brain: No evidence of acute infarction, hemorrhage, hydrocephalus, extra-axial collection, or mass lesion/mass effect. Stable encephalomalacia involving the anterior right temporal lobe. Moderate central cerebral atrophy and ventriculomegaly remain stable. Moderate chronic small vessel disease is also unchanged in appearance. Vascular:  No  hyperdense vessel or other acute findings. Skull: No evidence of fracture or other significant bone abnormality. Sinuses/Orbits:  No acute findings. Other: None. IMPRESSION: No acute intracranial abnormality. Stable right temporal lobe encephalomalacia. Stable cerebral atrophy and chronic small vessel disease. Electronically Signed   By: Marlaine Hind M.D.   On: 08/12/2020 15:19   DG Foot Complete Left  Result Date: 08/11/2020 CLINICAL DATA:  Fall.  Complaints of left foot and ankle pain. EXAM: LEFT FOOT - COMPLETE 3+ VIEW COMPARISON:  Left ankle 08/11/2020 FINDINGS: No evidence for an acute fracture or dislocation. Mild cortical irregularity involving the little toe proximal phalanx along the distal aspect. Suspect this is an old injury. Normal alignment of the left foot. IMPRESSION: 1. No acute abnormality involving the left foot. 2. Mild irregularity involving little toe proximal phalanx as described. Suspect this is an old injury but consider clinical correlation in this area. Electronically Signed   By: Markus Daft M.D.   On: 08/11/2020 14:04   DG Foot Complete Right  Result Date: 08/11/2020 CLINICAL DATA:  Right foot and ankle pain after a fall. Initial encounter. EXAM: RIGHT FOOT COMPLETE - 3+ VIEW COMPARISON:  Plain films right ankle 07/22/2020. FINDINGS: There is no evidence of fracture or dislocation. There is no  evidence of arthropathy or other focal bone abnormality. Soft tissues are unremarkable. IMPRESSION: Negative exam. Electronically Signed   By: Inge Rise M.D.   On: 08/11/2020 15:59    06/11/2017 echocardiogram -------------------------------------------------------------------  LV EF: 60% -   65%   -------------------------------------------------------------------  Indications:      Syncope 780.2.   -------------------------------------------------------------------  History:   PMH:  Bipolar, Former Smoker, GERD, Ischemic Stroke.   -------------------------------------------------------------------  Study Conclusions   - Left ventricle: The cavity size was normal. Wall thickness was    increased in a pattern of mild LVH. Systolic function was normal.    The estimated ejection fraction was in the range of 60% to 65%.    Wall motion was normal; there were no regional wall motion    abnormalities. Left ventricular diastolic function parameters    were normal for the patient&'s age.  - Mitral valve: There was trivial regurgitation.  - Right atrium: Central venous pressure (est): 3 mm Hg.  - Atrial septum: No defect or patent foramen ovale was identified.  - Tricuspid valve: There was mild regurgitation.  - Pulmonary arteries: PA peak pressure: 43 mm Hg (S).  - Pericardium, extracardiac: A probable, small pericardial effusion    was identified posterior to the heart and anterior to the heart.   EKG: Independently reviewed.  Vent. rate 84 BPM PR interval 60 ms QRS duration 111 ms QT/QTcB 352/416 ms P-R-T axes 0 34 81 Sinus rhythm Short PR interval Right atrial enlargement Consider anterior infarct Nonspecific T abnormalities, lateral leads  Assessment/Plan Principal Problem:   Acute metabolic encephalopathy In the setting of:   SIRS (systemic inflammatory response syndrome) (HCC) Observation/telemetry. Continue IV fluids. Acetaminophen as needed Continue cefepime per  pharmacy. Continue vancomycin per pharmacy. Continue metronidazole 500 mg IVPB every 8 hours. Follow-up blood cultures, CBC, renal function electrolytes.  Active Problems:   Normocytic anemia Normal anemia panel in 2019. Regularly on B12 and MVI supplementation. Monitor hematocrit and hemoglobin.    Hyperthyroidism Continue methimazole 10 mg every other day.    GERD (gastroesophageal reflux disease) Continue pantoprazole 40 mg p.o. daily.    Bipolar 1 disorder ([place])   Dementia without behavioral disturbance (HCC) Continue valproic acid 1000 mg  p.o. nightly. Continue benztropine 1 mg p.o. twice daily. Continue risperidone and trazodone at bedtime.    Mild protein malnutrition (HCC) Protein supplementation. Consider nutritional services evaluation.    Hypomagnesemia Magnesium sulfate 2 g IVPB x1. Follow-up level as needed. May benefit from regular supplementation.    DVT prophylaxis: Lovenox SQ. Code Status:   Full code. Family Communication:   Disposition Plan:   Patient is from:  Home.  Anticipated DC to:  Home.  Anticipated DC date:  08/14/2020 or 08/15/2020.  Anticipated DC barriers: Clinical status.  Consults called:   Admission status:  Observation/telemetry.   Severity of Illness: High severity after presenting with altered mental status in the setting of high fever secondary to SIRS.  Reubin Milan MD [facility]  How to contact the [facility] Attending or Consulting provider [place] or covering provider during after hours [place], for this patient?   Check the care team in [facility] and look for a) attending/consulting [facility] provider listed and b) the [facility] team listed Log into [email or web address] and use [place]'s universal password to access. If you do not have the password, please contact the hospital operator. Locate the [facility] provider you are looking for under [facility] and page to a number that you can be directly reached. If you still have  difficulty reaching the provider, please page the [facility] (Director on Call) for the Hospitalists listed on amion for assistance.  08/12/2020, 5:54 PM   This document was prepared using Dragon voice recognition software and may contain some unintended transcription errors.

## 2020-08-13 DIAGNOSIS — D696 Thrombocytopenia, unspecified: Secondary | ICD-10-CM | POA: Diagnosis present

## 2020-08-13 DIAGNOSIS — F0391 Unspecified dementia with behavioral disturbance: Secondary | ICD-10-CM | POA: Diagnosis present

## 2020-08-13 DIAGNOSIS — F419 Anxiety disorder, unspecified: Secondary | ICD-10-CM | POA: Diagnosis present

## 2020-08-13 DIAGNOSIS — D649 Anemia, unspecified: Secondary | ICD-10-CM | POA: Diagnosis present

## 2020-08-13 DIAGNOSIS — E876 Hypokalemia: Secondary | ICD-10-CM | POA: Diagnosis present

## 2020-08-13 DIAGNOSIS — Z8673 Personal history of transient ischemic attack (TIA), and cerebral infarction without residual deficits: Secondary | ICD-10-CM | POA: Diagnosis not present

## 2020-08-13 DIAGNOSIS — E441 Mild protein-calorie malnutrition: Secondary | ICD-10-CM | POA: Diagnosis present

## 2020-08-13 DIAGNOSIS — R6510 Systemic inflammatory response syndrome (SIRS) of non-infectious origin without acute organ dysfunction: Secondary | ICD-10-CM | POA: Diagnosis present

## 2020-08-13 DIAGNOSIS — E0590 Thyrotoxicosis, unspecified without thyrotoxic crisis or storm: Secondary | ICD-10-CM | POA: Diagnosis present

## 2020-08-13 DIAGNOSIS — R824 Acetonuria: Secondary | ICD-10-CM | POA: Diagnosis present

## 2020-08-13 DIAGNOSIS — Z6820 Body mass index (BMI) 20.0-20.9, adult: Secondary | ICD-10-CM | POA: Diagnosis not present

## 2020-08-13 DIAGNOSIS — Z803 Family history of malignant neoplasm of breast: Secondary | ICD-10-CM | POA: Diagnosis not present

## 2020-08-13 DIAGNOSIS — Z6372 Alcoholism and drug addiction in family: Secondary | ICD-10-CM | POA: Diagnosis not present

## 2020-08-13 DIAGNOSIS — Z811 Family history of alcohol abuse and dependence: Secondary | ICD-10-CM | POA: Diagnosis not present

## 2020-08-13 DIAGNOSIS — Z79899 Other long term (current) drug therapy: Secondary | ICD-10-CM | POA: Diagnosis not present

## 2020-08-13 DIAGNOSIS — G9389 Other specified disorders of brain: Secondary | ICD-10-CM | POA: Diagnosis present

## 2020-08-13 DIAGNOSIS — F319 Bipolar disorder, unspecified: Secondary | ICD-10-CM | POA: Diagnosis present

## 2020-08-13 DIAGNOSIS — G9341 Metabolic encephalopathy: Secondary | ICD-10-CM | POA: Diagnosis present

## 2020-08-13 DIAGNOSIS — Z87891 Personal history of nicotine dependence: Secondary | ICD-10-CM | POA: Diagnosis not present

## 2020-08-13 DIAGNOSIS — Z87440 Personal history of urinary (tract) infections: Secondary | ICD-10-CM | POA: Diagnosis not present

## 2020-08-13 DIAGNOSIS — K219 Gastro-esophageal reflux disease without esophagitis: Secondary | ICD-10-CM | POA: Diagnosis present

## 2020-08-13 DIAGNOSIS — Z20822 Contact with and (suspected) exposure to covid-19: Secondary | ICD-10-CM | POA: Diagnosis present

## 2020-08-13 DIAGNOSIS — Z7982 Long term (current) use of aspirin: Secondary | ICD-10-CM | POA: Diagnosis not present

## 2020-08-13 LAB — CBC WITH DIFFERENTIAL/PLATELET
Abs Immature Granulocytes: 0.02 10*3/uL (ref 0.00–0.07)
Basophils Absolute: 0 10*3/uL (ref 0.0–0.1)
Basophils Relative: 0 %
Eosinophils Absolute: 0.1 10*3/uL (ref 0.0–0.5)
Eosinophils Relative: 1 %
HCT: 34.4 % — ABNORMAL LOW (ref 36.0–46.0)
Hemoglobin: 11.1 g/dL — ABNORMAL LOW (ref 12.0–15.0)
Immature Granulocytes: 0 %
Lymphocytes Relative: 27 %
Lymphs Abs: 1.6 10*3/uL (ref 0.7–4.0)
MCH: 31.4 pg (ref 26.0–34.0)
MCHC: 32.3 g/dL (ref 30.0–36.0)
MCV: 97.2 fL (ref 80.0–100.0)
Monocytes Absolute: 0.4 10*3/uL (ref 0.1–1.0)
Monocytes Relative: 7 %
Neutro Abs: 3.8 10*3/uL (ref 1.7–7.7)
Neutrophils Relative %: 65 %
Platelets: 140 10*3/uL — ABNORMAL LOW (ref 150–400)
RBC: 3.54 MIL/uL — ABNORMAL LOW (ref 3.87–5.11)
RDW: 15.2 % (ref 11.5–15.5)
WBC: 5.9 10*3/uL (ref 4.0–10.5)
nRBC: 0 % (ref 0.0–0.2)

## 2020-08-13 LAB — BASIC METABOLIC PANEL
Anion gap: 7 (ref 5–15)
BUN: 12 mg/dL (ref 8–23)
CO2: 27 mmol/L (ref 22–32)
Calcium: 8.8 mg/dL — ABNORMAL LOW (ref 8.9–10.3)
Chloride: 102 mmol/L (ref 98–111)
Creatinine, Ser: 0.48 mg/dL (ref 0.44–1.00)
GFR, Estimated: >60 mL/min (ref >60)
Glucose, Bld: 96 mg/dL (ref 70–99)
Potassium: 3.3 mmol/L — ABNORMAL LOW (ref 3.5–5.1)
Sodium: 136 mmol/L (ref 135–145)

## 2020-08-13 LAB — GLUCOSE, CAPILLARY: Glucose-Capillary: 139 mg/dL — ABNORMAL HIGH (ref 70–99)

## 2020-08-13 LAB — PROCALCITONIN: Procalcitonin: <0.1 ng/mL

## 2020-08-13 MED ORDER — ORAL CARE MOUTH RINSE
15.0000 mL | Freq: Two times a day (BID) | OROMUCOSAL | Status: DC
  Administered 2020-08-13 – 2020-08-14 (×3): 15 mL via OROMUCOSAL

## 2020-08-13 MED ORDER — POTASSIUM CHLORIDE CRYS ER 20 MEQ PO TBCR
40.0000 meq | EXTENDED_RELEASE_TABLET | ORAL | Status: AC
  Administered 2020-08-13 (×2): 40 meq via ORAL
  Filled 2020-08-13 (×2): qty 2

## 2020-08-13 NOTE — Plan of Care (Signed)
  Problem: Health Behavior/Discharge Planning: Goal: Ability to manage health-related needs will improve Outcome: Progressing   Problem: Clinical Measurements: Goal: Will remain free from infection Outcome: Progressing   Problem: Activity: Goal: Risk for activity intolerance will decrease Outcome: Progressing   Problem: Safety: Goal: Ability to remain free from injury will improve Outcome: Progressing   Problem: Skin Integrity: Goal: Risk for impaired skin integrity will decrease Outcome: Progressing

## 2020-08-13 NOTE — Progress Notes (Signed)
Patient Demographics:    Cassidy Smith, is a 65 y.o. female, DOB - 1955-11-06, [id number]  Admit date - 08/12/2020   Admitting Physician Vincen Bejar Denton Brick, MD  Outpatient Primary MD for the patient is Neale Burly, MD  LOS - 0  Chief Complaint  Patient presents with   Altered Mental Status        Subjective:    Cassidy Smith today has no emesis,  No chest pain, resting comfortably, intermittently confused, required restraints overnight -T-max 102.5  Assessment  & Plan :    Principal Problem:   SIRS (systemic inflammatory response syndrome) (HCC) Active Problems:   Bipolar 1 disorder (HCC)   Normocytic anemia   Hypomagnesemia   Hyperthyroidism   GERD (gastroesophageal reflux disease)   Acute metabolic encephalopathy   Dementia without behavioral disturbance (HCC)   Mild protein malnutrition (HCC)  Brief Summary Cassidy Smith is a 65 y.o. 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 Coli UTI -However WBC WNL -PCT  < 0.10 -consider discontinue antibiotics on 08/14/2020 if cultures negative  2)Hypokalemia--replace and recheck  3)Anemia and thrombocytopenia--appears to  be at patient's baseline  4)Neuropsych-patient with dementia concerns as noted above #1, also has bipolar disorder/depression/anxiety -Continue Depakote, Cogentin , trazodone and risperidone  5) hyperthyroidism--continue methimazole  6)GERD-continue Protonix  Disposition/Need for in-Hospital Stay- patient unable to be discharged at this time due to SIRs--requiring antibiotics IV pending culture data*  Status is: Inpatient  Remains inpatient appropriate because: Please see disposition above  Disposition: The patient is from: Home              Anticipated d/c is to:  TBD              Anticipated d/c date is: 1 day              Patient currently is not medically stable to d/c. Barriers: Not Clinically Stable-   Code Status :  -  Code Status: Full Code   Family Communication:    (patient is alert, awake and coherent)  Discussed with Son Edwyna Ready at [phone] -he lives in [place] Pt lives brother Lorrin Goodell --[phone]  Consults  :  Na  DVT Prophylaxis  :   - SCDs  enoxaparin (LOVENOX) injection 40 mg Start: 08/12/20 1745  Lab Results  Component Value Date   PLT 140 (L) 08/13/2020    Inpatient Medications  Scheduled Meds:  aspirin EC  81 mg Oral Daily   atorvastatin  20 mg Oral q1800   benztropine  1 mg Oral BID   cholecalciferol  5,000 Units Oral Daily   divalproex  1,000 mg Oral QHS   enoxaparin (  LOVENOX) injection  40 mg Subcutaneous Q24H   furosemide  10 mg Oral Daily   gabapentin  100 mg Oral TID   mouth rinse  15 mL Mouth Rinse BID   [START ON 08/14/2020] methimazole  10 mg Oral QODAY   pantoprazole  40 mg Oral Daily   [START ON 08/14/2020] potassium chloride  10 mEq Oral QODAY   potassium chloride  40 mEq Oral Q3H   risperiDONE  2 mg Oral QHS   traZODone  150 mg Oral QHS   vitamin B-12  1,000 mcg Oral Daily   Continuous Infusions:  ceFEPime (MAXIPIME) IV 2 g (08/13/20 1715)   vancomycin     PRN Meds:.acetaminophen **OR** acetaminophen, ondansetron **OR**  ondansetron (ZOFRAN) IV    Anti-infectives (From admission, onward)    Start     Dose/Rate Route Frequency Ordered Stop   08/13/20 2000  vancomycin (VANCOCIN) IVPB 1000 mg/200 mL premix        1,000 mg 200 mL/hr over 60 Minutes Intravenous Every 24 hours 08/12/20 1917     08/13/20 0700  ceFEPIme (MAXIPIME) 2 g in sodium chloride 0.9 % 100 mL IVPB        2 g 200 mL/hr over 30 Minutes Intravenous Every 12 hours 08/12/20 1917     08/13/20 0000  metroNIDAZOLE (FLAGYL) IVPB 500 mg        500 mg 100 mL/hr over 60 Minutes Intravenous Every 8 hours 08/12/20 1743 08/13/20 0953   08/12/20 1715  ceFEPIme (MAXIPIME) 2 g in sodium chloride 0.9 % 100 mL IVPB        2 g 200 mL/hr over 30 Minutes Intravenous  Once 08/12/20 1708 08/12/20 1911   08/12/20 1715  metroNIDAZOLE (FLAGYL) IVPB 500 mg        500 mg 100 mL/hr over 60 Minutes Intravenous  Once 08/12/20 1708 08/12/20 1943   08/12/20 1715  vancomycin (VANCOCIN) IVPB 1000 mg/200 mL premix        1,000 mg 200 mL/hr over 60 Minutes Intravenous  Once 08/12/20 1708 08/12/20 2052   08/12/20 1415  cefTRIAXone (ROCEPHIN) 1 g in sodium chloride 0.9 % 100 mL IVPB        1 g 200 mL/hr over 30 Minutes Intravenous  Once 08/12/20 1405 08/12/20 1538         Objective:   Vitals:   08/13/20 0503 08/13/20 0503 08/13/20 0615 08/13/20 1415  BP: 114/62 114/62  131/62  Pulse: 73 73  76  Resp: '14 14  16  '$ Temp: 98.4 F (36.9 C) 98.4 F (36.9 C)  98.2 F (36.8 C)  TempSrc:    Oral  SpO2: 90% (!) 89% 92% 92%  Weight:      Height:        Wt Readings from Last 3 Encounters:  08/12/20 54.4 kg  08/11/20 54.4 kg  07/25/20 54.4 kg     Intake/Output Summary (Last 24 hours) at 08/13/2020 1738 Last data filed at 08/13/2020 1400 Gross per 24 hour  Intake 1824.22 ml  Output --  Net 1824.22 ml     Physical Exam  Gen:- Awake Alert, in no acute distress HEENT:- [place].AT, No sclera icterus Neck-Supple Neck,No JVD,.  Lungs-  CTAB , fair symmetrical air  movement CV- S1, S2 normal, regular  Abd-  +ve B.Sounds, Abd Soft, No tenderness,    Extremity/Skin:- No  edema, pedal pulses present  Psych-pleasantly confused from time to time Neuro-generalized weakness, no new focal deficits, no tremors   Data  Review:   Micro Results Recent Results (from the past 240 hour(s))  Culture, blood (Routine x 2)     Status: None (Preliminary result)   Collection Time: 08/12/20  2:03 PM   Specimen: BLOOD  Result Value Ref Range Status   Specimen Description BLOOD RIGHT ARM  Final   Special Requests   Final    BOTTLES DRAWN AEROBIC AND ANAEROBIC Blood Culture adequate volume   Culture   Final    NO GROWTH < 24 HOURS Performed at [facility], [address] [place], [place] [address]    Report Status PENDING  Incomplete  Culture, blood (Routine x 2)     Status: None (Preliminary result)   Collection Time: 08/12/20  2:03 PM   Specimen: BLOOD  Result Value Ref Range Status   Specimen Description BLOOD RIGHT WRIST  Final   Special Requests   Final    BOTTLES DRAWN AEROBIC AND ANAEROBIC Blood Culture adequate volume   Culture   Final    NO GROWTH < 24 HOURS Performed at [facility], [address] [place], [place] [address]    Report Status PENDING  Incomplete  Resp Panel by RT-PCR (Flu A&B, Covid) Nasopharyngeal Swab     Status: None   Collection Time: 08/12/20  2:05 PM   Specimen: Nasopharyngeal Swab; Nasopharyngeal(NP) swabs in vial transport medium  Result Value Ref Range Status   SARS Coronavirus 2 by RT PCR NEGATIVE NEGATIVE Final    Comment: (NOTE) SARS-CoV-2 target nucleic acids are NOT DETECTED.  The SARS-CoV-2 RNA is generally detectable in upper respiratory specimens during the acute phase of infection. The lowest concentration of SARS-CoV-2 viral copies this assay can detect is 138 copies/mL. A negative result does not preclude SARS-Cov-2 infection and should not be used as the sole basis for treatment or other patient management  decisions. A negative result may occur with  improper specimen collection/handling, submission of specimen other than nasopharyngeal swab, presence of viral mutation(s) within the areas targeted by this assay, and inadequate number of viral copies(<138 copies/mL). A negative result must be combined with clinical observations, patient history, and epidemiological information. The expected result is Negative.  Fact Sheet for Patients:  EntrepreneurPulse.com.au  Fact Sheet for Healthcare Providers:  IncredibleEmployment.be  This test is no t yet approved or cleared by the [place] FDA and  has been authorized for detection and/or diagnosis of SARS-CoV-2 by FDA under an Emergency Use Authorization (EUA). This EUA will remain  in effect (meaning this test can be used) for the duration of the COVID-19 declaration under Section 564(b)(1) of the Act, 21 U.S.C.section 360bbb-3(b)(1), unless the authorization is terminated  or revoked sooner.       Influenza A by PCR NEGATIVE NEGATIVE Final   Influenza B by PCR NEGATIVE NEGATIVE Final    Comment: (NOTE) The Xpert Xpress SARS-CoV-2/FLU/RSV plus assay is intended as an aid in the diagnosis of influenza from Nasopharyngeal swab specimens and should not be used as a sole basis for treatment. Nasal washings and aspirates are unacceptable for Xpert Xpress SARS-CoV-2/FLU/RSV testing.  Fact Sheet for Patients: EntrepreneurPulse.com.au  Fact Sheet for Healthcare Providers: IncredibleEmployment.be  This test is not yet approved or cleared by the [place] FDA and has been authorized for detection and/or diagnosis of SARS-CoV-2 by FDA under an Emergency Use Authorization (EUA). This EUA will remain in effect (meaning this test can be used) for the duration of the COVID-19 declaration under Section 564(b)(1) of the Act, 21 U.S.C.  section 360bbb-3(b)(1), unless the  authorization is terminated or revoked.  Performed at [facility], [address] [place], [place] [address]     Radiology Reports DG Chest 1 View  Result Date: 08/12/2020 CLINICAL DATA:  Fever.  Altered mental status. EXAM: CHEST  1 VIEW COMPARISON:  07/25/2020 FINDINGS: The heart size and mediastinal contours are within normal limits. Aortic atherosclerotic calcification noted. Both lungs are clear. The visualized skeletal structures are unremarkable. IMPRESSION: No active disease. Electronically Signed   By: Marlaine Hind M.D.   On: 08/12/2020 15:04   DG Ankle Complete Left  Result Date: 08/11/2020 CLINICAL DATA:  Left foot and ankle pain after falling. EXAM: LEFT ANKLE COMPLETE - 3+ VIEW COMPARISON:  Left foot 08/11/2020 FINDINGS: Negative for fracture or dislocation. Normal alignment at the left ankle. No focal soft tissue abnormality. IMPRESSION: No acute bone abnormality to the left ankle. Electronically Signed   By: Markus Daft M.D.   On: 08/11/2020 14:06   DG Ankle Complete Right  Result Date: 08/11/2020 CLINICAL DATA:  Right foot and ankle pain after a fall. Initial encounter. EXAM: RIGHT ANKLE - COMPLETE 3+ VIEW COMPARISON:  Plain films right foot 07/22/2020. FINDINGS: There is no evidence of fracture, dislocation, or joint effusion. There is no evidence of arthropathy or other focal bone abnormality. Soft tissues are unremarkable. IMPRESSION: Negative exam. Electronically Signed   By: Inge Rise M.D.   On: 08/11/2020 16:00   DG Ankle Complete Right  Result Date: 07/22/2020 CLINICAL DATA:  Patient fell this past Thursday with persistent right foot and ankle pain. EXAM: RIGHT ANKLE - COMPLETE 3+ VIEW COMPARISON:  Right foot radiographs-earlier same day FINDINGS: Apparent diffuse soft tissue swelling about the imaged lower leg, ankle and hindfoot. No associated fracture or dislocation. Joint spaces appear preserved. The ankle mortise appears preserved. Nodular structure posterior  to the calcaneus may be dermal in etiology. No radiopaque foreign body. No plantar calcaneal spur. IMPRESSION: Specific diffuse soft tissue swelling about the imaged lower leg, ankle and hindfoot, without associated fracture or dislocation. Electronically Signed   By: Sandi Mariscal M.D.   On: 07/22/2020 11:52   CT Head Wo Contrast  Result Date: 08/12/2020 CLINICAL DATA:  Altered mental status. Fever. EXAM: CT HEAD WITHOUT CONTRAST TECHNIQUE: Contiguous axial images were obtained from the base of the skull through the vertex without intravenous contrast. COMPARISON:  07/25/2020 FINDINGS: Brain: No evidence of acute infarction, hemorrhage, hydrocephalus, extra-axial collection, or mass lesion/mass effect. Stable encephalomalacia involving the anterior right temporal lobe. Moderate central cerebral atrophy and ventriculomegaly remain stable. Moderate chronic small vessel disease is also unchanged in appearance. Vascular:  No hyperdense vessel or other acute findings. Skull: No evidence of fracture or other significant bone abnormality. Sinuses/Orbits:  No acute findings. Other: None. IMPRESSION: No acute intracranial abnormality. Stable right temporal lobe encephalomalacia. Stable cerebral atrophy and chronic small vessel disease. Electronically Signed   By: Marlaine Hind M.D.   On: 08/12/2020 15:19   CT HEAD WO CONTRAST  Result Date: 07/25/2020 CLINICAL DATA:  Altered mental status. EXAM: CT HEAD WITHOUT CONTRAST TECHNIQUE: Contiguous axial images were obtained from the base of the skull through the vertex without intravenous contrast. 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 Similar global parenchymal volume loss with ex vacuo dilatation of ventricular system. 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 No evidence of acute large vascular territory infarction, hemorrhage, hydrocephalus,  extra-axial  collection or mass lesion/mass effect. Vascular: No hyperdense vessel. Atherosclerotic calcifications of the internal carotid arteries at the skull base. Skull: Normal. Negative for fracture or focal lesion. Sinuses/Orbits: Visualized portions of the paranasal sinuses and mastoid air cells are predominantly clear. Orbits are unremarkable. Other: None IMPRESSION: 1. No acute intracranial abnormality. 2. 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 3. 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 Electronically Signed   By: Dahlia Bailiff MD   On: 07/25/2020 23:05   DG Chest Port 1 View  Result Date: 07/25/2020 CLINICAL DATA:  Possible sepsis, fevers EXAM: PORTABLE CHEST 1 VIEW COMPARISON:  11/05/20 FINDINGS: Cardiac shadow is within normal limits. Aortic calcifications are noted. Previously seen right basilar infiltrate has resolved in the interval. No new focal infiltrate is seen. Skin fold is noted over the left chest. No bony abnormality is noted. IMPRESSION: No acute abnormality seen. Electronically Signed   By: Inez Catalina M.D.   On: 07/25/2020 19:33   DG Foot Complete Left  Result Date: 08/11/2020 CLINICAL DATA:  Fall.  Complaints of left foot and ankle pain. EXAM: LEFT FOOT - COMPLETE 3+ VIEW COMPARISON:  Left ankle 08/11/2020 FINDINGS: No evidence for an acute fracture or dislocation. Mild cortical irregularity involving the little toe proximal phalanx along the distal aspect. Suspect this is an old injury. Normal alignment of the left foot. IMPRESSION: 1. No acute abnormality involving the left foot. 2. Mild irregularity involving little toe proximal phalanx as described. Suspect this is an old injury but consider clinical correlation in this area. Electronically Signed   By: Markus Daft M.D.   On: 08/11/2020 14:04   DG Foot Complete Right  Result Date: 08/11/2020 CLINICAL DATA:  Right foot and ankle pain after  a fall. Initial encounter. EXAM: RIGHT FOOT COMPLETE - 3+ VIEW COMPARISON:  Plain films right ankle 07/22/2020. FINDINGS: There is no evidence of fracture or dislocation. There is no evidence of arthropathy or other focal bone abnormality. Soft tissues are unremarkable. IMPRESSION: Negative exam. Electronically Signed   By: Inge Rise M.D.   On: 08/11/2020 15:59   DG Foot Complete Right  Result Date: 07/22/2020 CLINICAL DATA:  Patient fell this past Thursday with persistent right foot and ankle pain. EXAM: RIGHT FOOT COMPLETE - 3+ VIEW COMPARISON:  Right ankle radiographs-earlier same day FINDINGS: Diffuse soft tissue swelling about the foot and ankle. No associated fracture or dislocation. Mild hallux valgus deformity. Joint spaces are preserved. No erosions. No plantar calcaneal spur. Nodular opacity posterior to the calcaneus may be dermal in etiology. IMPRESSION: 1. Nonspecific diffuse soft tissue swelling about the ankle without associated fracture or dislocation. 2. Mild hallux valgus deformity without associated degenerative change. Electronically Signed   By: Sandi Mariscal M.D.   On: 07/22/2020 11:54     CBC Recent Labs  Lab 08/12/20 1403 08/13/20 0200  WBC 8.1 5.9  HGB 11.9* 11.1*  HCT 37.3 34.4*  PLT 160 140*  MCV 96.6 97.2  MCH 30.8 31.4  MCHC 31.9 32.3  RDW 15.4 15.2  LYMPHSABS 1.3 1.6  MONOABS 0.5 0.4  EOSABS 0.2 0.1  BASOSABS 0.0 0.0    Chemistries  Recent Labs  Lab 08/12/20 1403 08/13/20 0200  NA 135 136  K 3.9 3.3*  CL 98 102  CO2 30 27  GLUCOSE 96 96  BUN 16 12  CREATININE 0.68 0.48  CALCIUM 9.4 8.8*  MG 1.5*  --  AST 15  --   ALT 10  --   ALKPHOS 70  --   BILITOT 0.4  --    ------------------------------------------------------------------------------------------------------------------ No results for input(s): CHOL, HDL, LDLCALC, TRIG, CHOLHDL, LDLDIRECT in the last 72 hours.  Lab Results  Component Value Date   HGBA1C 5.0 06/10/2017    ------------------------------------------------------------------------------------------------------------------ No results for input(s): TSH, T4TOTAL, T3FREE, THYROIDAB in the last 72 hours.  Invalid input(s): FREET3 ------------------------------------------------------------------------------------------------------------------ No results for input(s): VITAMINB12, FOLATE, FERRITIN, TIBC, IRON, RETICCTPCT in the last 72 hours.  Coagulation profile Recent Labs  Lab 08/12/20 1403  INR 1.0    No results for input(s): DDIMER in the last 72 hours.  Cardiac Enzymes No results for input(s): CKMB, TROPONINI, MYOGLOBIN in the last 168 hours.  Invalid input(s): CK ------------------------------------------------------------------------------------------------------------------ No results found for: BNP   Roxan Hockey M.D on 08/13/2020 at 5:38 PM  Go to [email or web address] - for contact info  [facility] - Office  [phone]

## 2020-08-13 NOTE — Care Management Obs Status (Signed)
[facility] NOTIFICATION   Patient Details  Name: ONESHIA UBALDO MRN: [id number] Date of Birth: 1955/06/11   Medicare Observation Status Notification Given:  Yes    Natasha Bence, LCSW 08/13/2020, 4:25 PM

## 2020-08-14 DIAGNOSIS — R6510 Systemic inflammatory response syndrome (SIRS) of non-infectious origin without acute organ dysfunction: Secondary | ICD-10-CM | POA: Diagnosis not present

## 2020-08-14 LAB — BASIC METABOLIC PANEL
Anion gap: 5 (ref 5–15)
BUN: 14 mg/dL (ref 8–23)
CO2: 29 mmol/L (ref 22–32)
Calcium: 9.2 mg/dL (ref 8.9–10.3)
Chloride: 101 mmol/L (ref 98–111)
Creatinine, Ser: 0.52 mg/dL (ref 0.44–1.00)
GFR, Estimated: >60 mL/min (ref >60)
Glucose, Bld: 84 mg/dL (ref 70–99)
Potassium: 5.1 mmol/L (ref 3.5–5.1)
Sodium: 135 mmol/L (ref 135–145)

## 2020-08-14 MED ORDER — ENSURE ENLIVE PO LIQD: 237.0000 mL | Freq: Two times a day (BID) | ORAL | 12 refills | Status: DC

## 2020-08-14 MED ORDER — ENSURE ENLIVE PO LIQD
237.0000 mL | Freq: Two times a day (BID) | ORAL | Status: DC
  Administered 2020-08-14: 237 mL via ORAL

## 2020-08-14 MED ORDER — ASPIRIN EC 81 MG PO TBEC: 81.0000 mg | DELAYED_RELEASE_TABLET | Freq: Every day | ORAL | 11 refills | Status: AC

## 2020-08-14 MED ORDER — ACETAMINOPHEN 325 MG PO TABS: 650.0000 mg | ORAL_TABLET | Freq: Four times a day (QID) | ORAL | 3 refills | Status: DC | PRN

## 2020-08-14 NOTE — Care Management Important Message (Signed)
Important Message  Patient Details  Name: Cassidy Smith MRN: [id number] Date of Birth: 1955/04/11   Medicare Important Message Given:  Yes     Tommy Medal 08/14/2020, 12:18 PM

## 2020-08-14 NOTE — Discharge Instructions (Signed)
1) patient will need 24/7 supervision to avoid elopement and self injury 2) follow-up with PCP in 1 to 2 weeks for recheck and reevaluation

## 2020-08-14 NOTE — Evaluation (Signed)
Physical Therapy Evaluation Patient Details Name: Cassidy Smith MRN: [id number] DOB: 24-Jan-1955 Today's Date: 08/14/2020   History of Present Illness  Cassidy Smith is a 65 y.o. female with medical history significant of anxiety, depression, bipolar 1 disorder, chronic DVT history, chronic hip pain, history of unspecified GI bleed, history of UTIs, headache, hypothyroidism with history of thyrotoxicosis, previous episode of hypokalemia, history of intracerebral hematoma, history of other known hemorrhagic stroke, history of RLL pneumonia, rhabdomyolysis, SIADH who is brought to the emergency department via EMS due to fever with altered mental status.  She was admitted for 3 days and discharged 2 weeks ago for severe sepsis due to ESBL E. coli UTI.  Her mental status improved after fluids and IV antibiotics.  She knows she is currently at [facility], but is disoriented to time and situation.  She answers simple questions.  Denies headache, back, chest or abdominal pain.  She is unable to provide further information.  EDP and myself have been unable to contact family members so far.   Clinical Impression  Patient demonstrates slow labored movement for sitting up at bedside requiring frequent verbal cueing to initiate functional activities, very unsteady on feet and limited to a few steps at bedside before requesting to sit down due to c/o fatigue.  Patient incontinent of stool during transfer, cleaned and tolerated sitting up in chair after therapy - NT notified.  Patient will benefit from continued physical therapy in hospital and recommended venue below to increase strength, balance, endurance for safe ADLs and gait.      Follow Up Recommendations SNF;Supervision/Assistance - 24 hour;Supervision for mobility/OOB;Supervision - Intermittent    Equipment Recommendations  None recommended by PT    Recommendations for Other Services       Precautions / Restrictions Precautions Precautions:  Fall Restrictions Weight Bearing Restrictions: No      Mobility  Bed Mobility Overal bed mobility: Needs Assistance Bed Mobility: Supine to Sit     Supine to sit: Min assist;Mod assist     General bed mobility comments: slow labored movement with frequent verbal cues    Transfers Overall transfer level: Needs assistance Equipment used: Rolling walker (2 wheeled) Transfers: Sit to/from [facility] Sit to Stand: Min assist Stand pivot transfers: Min assist;Mod assist       General transfer comment: slow labored movement  Ambulation/Gait Ambulation/Gait assistance: Mod assist Gait Distance (Feet): 5 Feet Assistive device: Rolling walker (2 wheeled) Gait Pattern/deviations: Decreased step length - right;Decreased step length - left;Decreased stride length Gait velocity: decreased   General Gait Details: limited to 5-6 slow labored side steps at bedside mostly due to c/o fatigue and generalized weakness  Stairs            Wheelchair Mobility    Modified Rankin (Stroke Patients Only)       Balance Overall balance assessment: Needs assistance Sitting-balance support: Feet supported;No upper extremity supported Sitting balance-Leahy Scale: Fair Sitting balance - Comments: EOB   Standing balance support: During functional activity;Bilateral upper extremity supported Standing balance-Leahy Scale: Poor Standing balance comment: fair/poor using RW                             Pertinent Vitals/Pain Pain Assessment: No/denies pain    Home Living Family/patient expects to be discharged to:: Private residence Living Arrangements: Alone Available Help at Discharge: Family;Personal care attendant Type of Home: House Home Access: Level entry     Home  Layout: Two level;Bed/bath upstairs Home Equipment: Walker - 2 wheels;Shower seat;Grab bars - tub/shower      Prior Function Level of Independence: Needs assistance   Gait /  Transfers Assistance Needed: assisted for household ambulation using RW  ADL's / Homemaking Assistance Needed: assisted by brother and home aides        Hand Dominance   Dominant Hand: Right    Extremity/Trunk Assessment   Upper Extremity Assessment Upper Extremity Assessment: Generalized weakness    Lower Extremity Assessment Lower Extremity Assessment: Generalized weakness    Cervical / Trunk Assessment Cervical / Trunk Assessment: Normal  Communication   Communication: No difficulties  Cognition Arousal/Alertness: Awake/alert Behavior During Therapy: WFL for tasks assessed/performed Overall Cognitive Status: History of cognitive impairments - at baseline                                        General Comments      Exercises     Assessment/Plan    PT Assessment Patient needs continued PT services  PT Problem List Decreased strength;Decreased activity tolerance;Decreased balance;Decreased mobility       PT Treatment Interventions DME instruction;Gait training;Stair training;Functional mobility training;Therapeutic activities;Therapeutic exercise;Balance training;Patient/family education    PT Goals (Current goals can be found in the Care Plan section)  Acute Rehab PT Goals Patient Stated Goal: return home PT Goal Formulation: With patient Time For Goal Achievement: 08/28/20 Potential to Achieve Goals: Good    Frequency Min 3X/week   Barriers to discharge        Co-evaluation               AM-PAC PT "6 Clicks" Mobility  Outcome Measure Help needed turning from your back to your side while in a flat bed without using bedrails?: A Little Help needed moving from lying on your back to sitting on the side of a flat bed without using bedrails?: A Lot Help needed moving to and from a bed to a chair (including a wheelchair)?: A Lot Help needed standing up from a chair using your arms (e.g., wheelchair or bedside chair)?: A Lot Help  needed to walk in hospital room?: A Lot Help needed climbing 3-5 steps with a railing? : A Lot 6 Click Score: 13    End of Session   Activity Tolerance: Patient tolerated treatment well;Patient limited by fatigue Patient left: in chair;with call bell/phone within reach Nurse Communication: Mobility status PT Visit Diagnosis: Unsteadiness on feet (R26.81);Muscle weakness (generalized) (M62.81);Difficulty in walking, not elsewhere classified (R26.2)    Time: [id number] PT Time Calculation (min) (ACUTE ONLY): 28 min   Charges:   PT Evaluation $PT Eval Moderate Complexity: 1 Mod PT Treatments $Therapeutic Activity: 23-37 mins        1:47 PM, 08/14/20 Lonell Grandchild, MPT Physical Therapist with [facility] [phone] office [phone] mobile phone

## 2020-08-14 NOTE — Plan of Care (Signed)
  Problem: Acute Rehab PT Goals(only PT should resolve) Goal: Pt Will Go Supine/Side To Sit Outcome: Progressing Flowsheets (Taken 08/14/2020 1351) Pt will go Supine/Side to Sit:  with min guard assist  with minimal assist Goal: Patient Will Transfer Sit To/From Stand Outcome: Progressing Flowsheets (Taken 08/14/2020 1351) Patient will transfer sit to/from stand:  with min guard assist  with minimal assist Goal: Pt Will Transfer Bed To Chair/Chair To Bed Outcome: Progressing Flowsheets (Taken 08/14/2020 1351) Pt will Transfer Bed to Chair/Chair to Bed: with min assist Goal: Pt Will Ambulate Outcome: Progressing Flowsheets (Taken 08/14/2020 1351) Pt will Ambulate:  25 feet  with minimal assist  with rolling walker   1:51 PM, 08/14/20 Lonell Grandchild, MPT Physical Therapist with [facility] [phone] office [phone] mobile phone

## 2020-08-14 NOTE — Discharge Summary (Signed)
Cassidy Smith, is a 65 y.o. female  DOB Mar 19, 1955  MRN [id number].  Admission date:  08/12/2020  Admitting Physician  Roxan Hockey, MD  Discharge Date:  08/14/2020   Primary MD  Neale Burly, MD  Recommendations for primary care physician for things to follow:   1) patient will need 24/7 supervision to avoid elopement and self injury 2) follow-up with PCP in 1 to 2 weeks for recheck and reevaluation   Admission Diagnosis  SIRS (systemic inflammatory response syndrome) ([place]) [R65.10] AMS (altered mental status) [R41.82]   Discharge Diagnosis  SIRS (systemic inflammatory response syndrome) ([place]) [R65.10] AMS (altered mental status) [R41.82]    Principal Problem:   SIRS (systemic inflammatory response syndrome) ([place]) Active Problems:   Dementia without behavioral disturbance (HCC)   Bipolar 1 disorder ([place])   Hyperthyroidism   Normocytic anemia   Hypomagnesemia   GERD (gastroesophageal reflux disease)   Acute metabolic encephalopathy   Mild protein malnutrition ([place])      Past Medical History:  Diagnosis Date   Anxiety    Bipolar 1 disorder ([place])    Chronic deep vein thrombosis (DVT) of distal vein of lower extremity ([place]) 07/10/2017   Chronic hip pain    Depression    GI bleed    Headache(784.0)    History of UTI/bladder spams.    Hyperthyroidism    Hypokalemia 08/15/2017   Intracerebral hematoma ([place]) 07/04/2019   Ischemic stroke (HCC)    Neuromuscular disorder (HCC)    shaking of hands    Pneumonia of right lower lobe due to infectious organism 11/06/2019   Radial styloid fracture: right 03/06/2017   Rhabdomyolysis 06/07/2015   SIADH (syndrome of inappropriate ADH production) ([place]) 04/08/2017   Thyrotoxicosis, unspecified with thyrotoxic crisis or storm 01/15/2019    Past Surgical History:  Procedure Laterality Date   COLONOSCOPY WITH PROPOFOL N/A 04/05/2017    Procedure: COLONOSCOPY WITH PROPOFOL;  Surgeon: Rogene Houston, MD;  [facility];  Service: Endoscopy;  Laterality: N/A;   INTRAMEDULLARY (IM) NAIL INTERTROCHANTERIC Right 03/07/2017   Procedure: OPEN TREATMENT INTERNAL FIXATION RIGHT HIP WITH GAMA INTRAMEDULARY NAIL;  Surgeon: Carole Civil, MD;  Location: [facility];  Service: Orthopedics;  Laterality: Right;   MULTIPLE EXTRACTIONS WITH ALVEOLOPLASTY N/A 10/30/2012   Procedure: MULTIPLE EXTRACION 5, 6, 8, 9, 10 ,18, 19, 31 WITH MAXILLARY RIGHT AND LEFT  ALVEOLOPLASTY REDUCE MAXILLARY LEFT TUBEROSITY;  Surgeon: Gae Bon, DDS;  Location: [facility];  Service: Oral Surgery;  Laterality: N/A;   POLYPECTOMY  04/05/2017   Procedure: POLYPECTOMY;  Surgeon: Rogene Houston, MD;  [facility];  Service: Endoscopy;;  recto-sigmoid, rectum       HPI  from the history and physical done on the day of admission:    Chief Complaint: Altered mental status.   HPI: Cassidy Smith is a 65 y.o. female with medical history significant of anxiety, depression, bipolar 1 disorder, chronic DVT history, chronic hip pain, history of unspecified GI bleed, history of  UTIs, headache, hypothyroidism with history of thyrotoxicosis, previous episode of hypokalemia, history of intracerebral hematoma, history of other known hemorrhagic stroke, history of RLL pneumonia, rhabdomyolysis, SIADH who is brought to the emergency department via EMS due to fever with altered mental status.  She was admitted for 3 days and discharged 2 weeks ago for severe sepsis due to ESBL E. coli UTI.  Her mental status improved after fluids and IV antibiotics.  She knows she is currently at [facility], but is disoriented to time and situation.  She answers simple questions.  Denies headache, back, chest or abdominal pain.  She is unable to provide further information.  EDP and myself have been unable to contact family members so far.   ED Course: Initial vital signs were temperature  102.5 F, pulse 83, respirations 25, BP 145/74 mmHg O2 sat 96% on room air.  The patient received 2000 mL of NS bolus, cefepime, vancomycin and metronidazole.   Lab work: Her urinalysis only showed ketonuria 5 mg/dL, but was otherwise negative.  Blood cultures x2 were drawn.  CBC showed a white count of 8.1, hemoglobin 11.9 g/dL and platelets 160.  PT was 13.2 and INR 1.0.  CMP only remarkable for an albumin of 3.2 g/dL, but all other values were within expected range. Lactic acid was normal twice.   Imaging: CT head did not show any acute intracranial normality.  1 view chest radiograph did not show any active disease.  Bilateral feet and ankle radiographs done yesterday for an unrelated this is due to a fall earlier this week were negative.  Please see images and full radiology report for further detail.     Hospital Course:     Brief Summary Cassidy Smith is a 65 y.o. female with medical history significant of anxiety, depression, bipolar 1 disorder, chronic DVT history, chronic hip pain, history of GI bleed,   hyperthyroidism with history of thyrotoxicosis, history of intracerebral hematoma/hemorrhagic stroke with resulting encephalomalacia and cognitive and behavioral disturbance, recently treated for UTI and altered mentation readmitted on 08/12/2020 with fevers and altered mentation and concerns for infection    A/p 1) acute metabolic encephalopathy-- -Patient does have underlying dementia in the setting of prior intracranial hemorrhage which resulted in encephalomalacia and cognitive and behavioral disturbance -CT head without new acute findings There was concern for infection given fevers -Patient has recent history of ESBL E. Coli UTI -However WBC WNL -PCT  < 0.10 -Blood and urine cultures remain negative--no further antibiotics needed -Suspect metabolic encephalopathy was due to underlying dementia rather than infection at this time   2)Hypokalemia--normalized after replacement    3)Anemia and thrombocytopenia--appears to be at patient's baseline   4)Neuropsych-patient with dementia concerns as noted above #1, also has bipolar disorder/depression/anxiety -Continue Depakote, Cogentin , trazodone and risperidone   5) hyperthyroidism--continue methimazole   6)GERD-continue Protonix   Disposition--discharge home in stable condition  Disposition: The patient is from: Home              Anticipated d/c is to: Home with home health, family declines SNF rehab as recommended by physical therapist   Code Status :  -  Code Status: Full Code    Family Communication:    (patient is alert, awake and coherent) Discussed with Son Edwyna Ready at [phone] -he lives in [place] Pt lives brother Lorrin Goodell --[id number]   Discharge Condition: stable  Follow UP---pcp   Diet and Activity recommendation:  As advised  Discharge Instructions    Discharge Instructions  Call MD for:  difficulty breathing, headache or visual disturbances   Complete by: As directed    Call MD for:  persistant dizziness or light-headedness   Complete by: As directed    Call MD for:  persistant nausea and vomiting   Complete by: As directed    Call MD for:  temperature >100.4   Complete by: As directed    Diet - low sodium heart healthy   Complete by: As directed    Discharge instructions   Complete by: As directed    1) patient will need 24/7 supervision to avoid elopement and self injury 2) follow-up with PCP in 1 to 2 weeks for recheck and reevaluation   Increase activity slowly   Complete by: As directed    No wound care   Complete by: As directed          Discharge Medications     Allergies as of 08/14/2020   No Known Allergies      Medication List     TAKE these medications    acetaminophen 325 MG tablet Commonly known as: TYLENOL Take 2 tablets (650 mg total) by mouth every 6 (six) hours as needed for mild pain (or Fever >/= 101).   aspirin EC 81 MG tablet Take  1 tablet (81 mg total) by mouth daily with breakfast. Swallow whole. What changed: when to take this   atorvastatin 20 MG tablet Commonly known as: LIPITOR Take 1 tablet (20 mg total) by mouth daily at 6 PM.   benztropine 1 MG tablet Commonly known as: COGENTIN Take 1 tablet (1 mg total) by mouth 2 (two) times daily.   divalproex 500 MG 24 hr tablet Commonly known as: DEPAKOTE ER Take 2 tablets (1,000 mg total) by mouth at bedtime.   feeding supplement Liqd Take 237 mLs by mouth 2 (two) times daily between meals. Start taking on: August 15, 2020   furosemide 20 MG tablet Commonly known as: LASIX Take 10 mg by mouth daily.   gabapentin 100 MG capsule Commonly known as: NEURONTIN Take 1 capsule (100 mg total) by mouth 3 (three) times daily.   methimazole 10 MG tablet Commonly known as: TAPAZOLE Take 10 mg by mouth every other day.   multivitamin with minerals Tabs tablet Take 1 tablet by mouth daily. Centrum Silver   pantoprazole 40 MG tablet Commonly known as: PROTONIX Take 40 mg by mouth daily.   potassium chloride 10 MEQ tablet Commonly known as: KLOR-CON Take 1 tablet (10 mEq total) by mouth every other day.   risperiDONE 2 MG tablet Commonly known as: RISPERDAL Take 2 mg by mouth at bedtime.   traZODone 100 MG tablet Commonly known as: DESYREL Take 200 mg by mouth at bedtime.   vitamin B-12 1000 MCG tablet Commonly known as: CYANOCOBALAMIN Take 1 tablet (1,000 mcg total) by mouth daily.   Vitamin D-3 125 MCG (5000 UT) Tabs Take 5,000 Units by mouth daily.        Major procedures and Radiology Reports - PLEASE review detailed and final reports for all details, in brief -   DG Chest 1 View  Result Date: 08/12/2020 CLINICAL DATA:  Fever.  Altered mental status. EXAM: CHEST  1 VIEW COMPARISON:  07/25/2020 FINDINGS: The heart size and mediastinal contours are within normal limits. Aortic atherosclerotic calcification noted. Both lungs are clear. The  visualized skeletal structures are unremarkable. IMPRESSION: No active disease. Electronically Signed   By: Marlaine Hind M.D.   On: 08/12/2020  15:04   DG Ankle Complete Left  Result Date: 08/11/2020 CLINICAL DATA:  Left foot and ankle pain after falling. EXAM: LEFT ANKLE COMPLETE - 3+ VIEW COMPARISON:  Left foot 08/11/2020 FINDINGS: Negative for fracture or dislocation. Normal alignment at the left ankle. No focal soft tissue abnormality. IMPRESSION: No acute bone abnormality to the left ankle. Electronically Signed   By: Markus Daft M.D.   On: 08/11/2020 14:06   DG Ankle Complete Right  Result Date: 08/11/2020 CLINICAL DATA:  Right foot and ankle pain after a fall. Initial encounter. EXAM: RIGHT ANKLE - COMPLETE 3+ VIEW COMPARISON:  Plain films right foot 07/22/2020. FINDINGS: There is no evidence of fracture, dislocation, or joint effusion. There is no evidence of arthropathy or other focal bone abnormality. Soft tissues are unremarkable. IMPRESSION: Negative exam. Electronically Signed   By: Inge Rise M.D.   On: 08/11/2020 16:00   DG Ankle Complete Right  Result Date: 07/22/2020 CLINICAL DATA:  Patient fell this past Thursday with persistent right foot and ankle pain. EXAM: RIGHT ANKLE - COMPLETE 3+ VIEW COMPARISON:  Right foot radiographs-earlier same day FINDINGS: Apparent diffuse soft tissue swelling about the imaged lower leg, ankle and hindfoot. No associated fracture or dislocation. Joint spaces appear preserved. The ankle mortise appears preserved. Nodular structure posterior to the calcaneus may be dermal in etiology. No radiopaque foreign body. No plantar calcaneal spur. IMPRESSION: Specific diffuse soft tissue swelling about the imaged lower leg, ankle and hindfoot, without associated fracture or dislocation. Electronically Signed   By: Sandi Mariscal M.D.   On: 07/22/2020 11:52   CT Head Wo Contrast  Result Date: 08/12/2020 CLINICAL DATA:  Altered mental status. Fever. EXAM: CT HEAD  WITHOUT CONTRAST TECHNIQUE: Contiguous axial images were obtained from the base of the skull through the vertex without intravenous contrast. COMPARISON:  07/25/2020 FINDINGS: Brain: No evidence of acute infarction, hemorrhage, hydrocephalus, extra-axial collection, or mass lesion/mass effect. Stable encephalomalacia involving the anterior right temporal lobe. Moderate central cerebral atrophy and ventriculomegaly remain stable. Moderate chronic small vessel disease is also unchanged in appearance. Vascular:  No hyperdense vessel or other acute findings. Skull: No evidence of fracture or other significant bone abnormality. Sinuses/Orbits:  No acute findings. Other: None. IMPRESSION: No acute intracranial abnormality. Stable right temporal lobe encephalomalacia. Stable cerebral atrophy and chronic small vessel disease. Electronically Signed   By: Marlaine Hind M.D.   On: 08/12/2020 15:19   CT HEAD WO CONTRAST  Result Date: 07/25/2020 CLINICAL DATA:  Altered mental status. EXAM: CT HEAD WITHOUT CONTRAST TECHNIQUE: Contiguous axial images were obtained from the base of the skull through the vertex without intravenous contrast. 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 Similar global parenchymal volume loss with ex vacuo dilatation of ventricular system. 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 No evidence of acute large vascular territory infarction, hemorrhage, hydrocephalus, extra-axial collection or mass lesion/mass effect. Vascular: No hyperdense vessel. Atherosclerotic calcifications of the internal carotid arteries at the skull base. Skull: Normal. Negative for fracture or focal lesion. Sinuses/Orbits: Visualized portions of the paranasal sinuses and mastoid air cells are predominantly clear. Orbits are unremarkable. Other: None IMPRESSION: 1. No acute intracranial abnormality. 2. Stable  encephalomalacia in the right temporal lobe at the site of prior hemorrhage. 3. 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 Electronically Signed   By: Dahlia Bailiff MD  On: 07/25/2020 23:05   DG Chest Port 1 View  Result Date: 07/25/2020 CLINICAL DATA:  Possible sepsis, fevers EXAM: PORTABLE CHEST 1 VIEW COMPARISON:  11/05/20 FINDINGS: Cardiac shadow is within normal limits. Aortic calcifications are noted. Previously seen right basilar infiltrate has resolved in the interval. No new focal infiltrate is seen. Skin fold is noted over the left chest. No bony abnormality is noted. IMPRESSION: No acute abnormality seen. Electronically Signed   By: Inez Catalina M.D.   On: 07/25/2020 19:33   DG Foot Complete Left  Result Date: 08/11/2020 CLINICAL DATA:  Fall.  Complaints of left foot and ankle pain. EXAM: LEFT FOOT - COMPLETE 3+ VIEW COMPARISON:  Left ankle 08/11/2020 FINDINGS: No evidence for an acute fracture or dislocation. Mild cortical irregularity involving the little toe proximal phalanx along the distal aspect. Suspect this is an old injury. Normal alignment of the left foot. IMPRESSION: 1. No acute abnormality involving the left foot. 2. Mild irregularity involving little toe proximal phalanx as described. Suspect this is an old injury but consider clinical correlation in this area. Electronically Signed   By: Markus Daft M.D.   On: 08/11/2020 14:04   DG Foot Complete Right  Result Date: 08/11/2020 CLINICAL DATA:  Right foot and ankle pain after a fall. Initial encounter. EXAM: RIGHT FOOT COMPLETE - 3+ VIEW COMPARISON:  Plain films right ankle 07/22/2020. FINDINGS: There is no evidence of fracture or dislocation. There is no evidence of arthropathy or other focal bone abnormality. Soft tissues are unremarkable. IMPRESSION: Negative exam. Electronically Signed   By: Inge Rise M.D.   On: 08/11/2020 15:59   DG Foot Complete Right  Result  Date: 07/22/2020 CLINICAL DATA:  Patient fell this past Thursday with persistent right foot and ankle pain. EXAM: RIGHT FOOT COMPLETE - 3+ VIEW COMPARISON:  Right ankle radiographs-earlier same day FINDINGS: Diffuse soft tissue swelling about the foot and ankle. No associated fracture or dislocation. Mild hallux valgus deformity. Joint spaces are preserved. No erosions. No plantar calcaneal spur. Nodular opacity posterior to the calcaneus may be dermal in etiology. IMPRESSION: 1. Nonspecific diffuse soft tissue swelling about the ankle without associated fracture or dislocation. 2. Mild hallux valgus deformity without associated degenerative change. Electronically Signed   By: Sandi Mariscal M.D.   On: 07/22/2020 11:54    Micro Results   Recent Results (from the past 240 hour(s))  Culture, blood (Routine x 2)     Status: None (Preliminary result)   Collection Time: 08/12/20  2:03 PM   Specimen: BLOOD  Result Value Ref Range Status   Specimen Description BLOOD RIGHT ARM  Final   Special Requests   Final    BOTTLES DRAWN AEROBIC AND ANAEROBIC Blood Culture adequate volume   Culture   Final    NO GROWTH 2 DAYS Performed at [facility], [address] [place], [place] [address]    Report Status PENDING  Incomplete  Culture, blood (Routine x 2)     Status: None (Preliminary result)   Collection Time: 08/12/20  2:03 PM   Specimen: BLOOD  Result Value Ref Range Status   Specimen Description BLOOD RIGHT WRIST  Final   Special Requests   Final    BOTTLES DRAWN AEROBIC AND ANAEROBIC Blood Culture adequate volume   Culture   Final    NO GROWTH 2 DAYS Performed at [facility], [address] [place], [place] [address]    Report Status PENDING  Incomplete  Resp Panel by RT-PCR (  Flu A&B, Covid) Nasopharyngeal Swab     Status: None   Collection Time: 08/12/20  2:05 PM   Specimen: Nasopharyngeal Swab; Nasopharyngeal(NP) swabs in vial transport medium  Result Value Ref Range Status   SARS Coronavirus 2  by RT PCR NEGATIVE NEGATIVE Final    Comment: (NOTE) SARS-CoV-2 target nucleic acids are NOT DETECTED.  The SARS-CoV-2 RNA is generally detectable in upper respiratory specimens during the acute phase of infection. The lowest concentration of SARS-CoV-2 viral copies this assay can detect is 138 copies/mL. A negative result does not preclude SARS-Cov-2 infection and should not be used as the sole basis for treatment or other patient management decisions. A negative result may occur with  improper specimen collection/handling, submission of specimen other than nasopharyngeal swab, presence of viral mutation(s) within the areas targeted by this assay, and inadequate number of viral copies(<138 copies/mL). A negative result must be combined with clinical observations, patient history, and epidemiological information. The expected result is Negative.  Fact Sheet for Patients:  EntrepreneurPulse.com.au  Fact Sheet for Healthcare Providers:  IncredibleEmployment.be  This test is no t yet approved or cleared by the [place] FDA and  has been authorized for detection and/or diagnosis of SARS-CoV-2 by FDA under an Emergency Use Authorization (EUA). This EUA will remain  in effect (meaning this test can be used) for the duration of the COVID-19 declaration under Section 564(b)(1) of the Act, 21 U.S.C.section 360bbb-3(b)(1), unless the authorization is terminated  or revoked sooner.       Influenza A by PCR NEGATIVE NEGATIVE Final   Influenza B by PCR NEGATIVE NEGATIVE Final    Comment: (NOTE) The Xpert Xpress SARS-CoV-2/FLU/RSV plus assay is intended as an aid in the diagnosis of influenza from Nasopharyngeal swab specimens and should not be used as a sole basis for treatment. Nasal washings and aspirates are unacceptable for Xpert Xpress SARS-CoV-2/FLU/RSV testing.  Fact Sheet for Patients: EntrepreneurPulse.com.au  Fact  Sheet for Healthcare Providers: IncredibleEmployment.be  This test is not yet approved or cleared by the [place] FDA and has been authorized for detection and/or diagnosis of SARS-CoV-2 by FDA under an Emergency Use Authorization (EUA). This EUA will remain in effect (meaning this test can be used) for the duration of the COVID-19 declaration under Section 564(b)(1) of the Act, 21 U.S.C. section 360bbb-3(b)(1), unless the authorization is terminated or revoked.  Performed at [facility], [address] [place], [place] [address]      Today   Subjective    [place] today has no new complaints No fever  Or chills   No Nausea, Vomiting or Diarrhea        Patient has been seen and examined prior to discharge   Objective   Blood pressure 119/70, pulse 64, temperature 98.1 F (36.7 C), temperature source Oral, resp. rate 18, height '5\' 4"'$  (1.626 m), weight 54.4 kg, SpO2 95 %.   Intake/Output Summary (Last 24 hours) at 08/14/2020 1658 Last data filed at 08/14/2020 1304 Gross per 24 hour  Intake 1145.89 ml  Output 1600 ml  Net -454.11 ml    Exam Gen:- Awake Alert, in no acute distress HEENT:- [place].AT, No sclera icterus Neck-Supple Neck,No JVD,. Lungs-  CTAB , fair symmetrical air movement CV- S1, S2 normal, regular Abd-  +ve B.Sounds, Abd Soft, No tenderness,    Extremity/Skin:- No  edema, pedal pulses present Psych-appropriate affect, , cooperative,  pleasantly confused from time to time Neuro-generalized weakness, no new focal deficits, no tremors  Data Review   CBC w Diff:  Lab Results  Component Value Date   WBC 5.9 08/13/2020   HGB 11.1 (L) 08/13/2020   HCT 34.4 (L) 08/13/2020   PLT 140 (L) 08/13/2020   LYMPHOPCT 27 08/13/2020   MONOPCT 7 08/13/2020   EOSPCT 1 08/13/2020   BASOPCT 0 08/13/2020    CMP:  Lab Results  Component Value Date   NA 135 08/14/2020   K 5.1 08/14/2020   CL 101 08/14/2020   CO2 29 08/14/2020   BUN 14  08/14/2020   CREATININE 0.52 08/14/2020   PROT 7.0 08/12/2020   ALBUMIN 3.2 (L) 08/12/2020   BILITOT 0.4 08/12/2020   ALKPHOS 70 08/12/2020   AST 15 08/12/2020   ALT 10 08/12/2020   Total Discharge time is about 33 minutes  Roxan Hockey M.D on 08/14/2020 at 4:58 PM  Go to [email or web address] -  for contact info  [facility] - Office  [phone]

## 2020-08-14 NOTE — TOC Transition Note (Signed)
Transition of Care [facility]) - CM/SW Discharge Note   Patient Details  Name: Cassidy Smith MRN: [id number] Date of Birth: 1955/12/11  Transition of Care [facility]) CM/SW Contact:  Shade Flood, LCSW Phone Number: 08/14/2020, 2:09 PM   Clinical Narrative:     Pt admitted from home. Pt known to TOC from previous admission. Pt discharged home with [facility] PT, OT, and RN about two weeks ago. TOC spoke with pt's son to update on PT recommendation for SNF rehab at dc. Son states that they prefer for pt to return home with continued [facility] services and their 24/7 caregivers.   Updated MD. Katheren Puller with [facility]. Anticipating dc home later today.  TOC will be available if any other dc needs arise.  Expected Discharge Plan: [place] Barriers to Discharge: Barriers Resolved   Patient Goals and CMS Choice Patient states their goals for this hospitalization and ongoing recovery are:: Home with [facility]      Expected Discharge Plan and Services Expected Discharge Plan: [place] In-house Referral: Clinical Social Work   Post Acute Care Choice: Resumption of Svcs/PTA Provider Living arrangements for the past 2 months: Single Family Home                                      Prior Living Arrangements/Services Living arrangements for the past 2 months: Single Family Home Lives with:: Siblings Patient language and need for interpreter reviewed:: Yes Do you feel safe going back to the place where you live?: Yes      Need for Family Participation in Patient Care: Yes (Comment) Care giver support system in place?: Yes (comment) Current home services: [facility] Criminal Activity/Legal Involvement Pertinent to Current Situation/Hospitalization: No - Comment as needed  Activities of Daily Living      Permission Sought/Granted                  Emotional Assessment Appearance:: Appears stated age     Orientation: : Oriented to Self,  Oriented to Place Alcohol / Substance Use: Not Applicable Psych Involvement: No (comment)  Admission diagnosis:  SIRS (systemic inflammatory response syndrome) ([place]) [R65.10] AMS (altered mental status) [R41.82] Patient Active Problem List   Diagnosis Date Noted   SIRS (systemic inflammatory response syndrome) ([place]) 08/12/2020   Mild protein malnutrition ([place]) 08/12/2020   Sepsis ([place]) 07/25/2020   Sepsis with acute hypoxic respiratory failure without septic shock ([place])    Dementia without behavioral disturbance ([place])    Palliative care by specialist    Goals of care, counseling/discussion    Pneumonia of right lower lobe due to infectious organism 11/06/2019   Intracerebral hematoma ([place]) 07/04/2019   Closed head injury 07/03/2019   Thrombocytopenia ([place]) 07/03/2019   Thyrotoxicosis, unspecified with thyrotoxic crisis or storm 01/15/2019   History of syndrome of inappropriate antidiuretic hormone (SIADH) 01/15/2019   UTI (urinary tract infection) 01/04/2018   Neuromuscular disorder ([place])    Dysuria 10/30/2017   Fever 08/15/2017   Hypokalemia 08/15/2017   Altered mental state 123456   Acute metabolic encephalopathy 123456   Valproic acid toxicity 08/15/2017   Rectal cancer ([place]) 07/10/2017   Chronic deep vein thrombosis (DVT) of distal vein of lower extremity ([place]) 07/10/2017   Altered mental status    Ischemic stroke ([place])    GERD (gastroesophageal reflux disease) 05/12/2017   GI bleed    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[place]) 04/08/2017   Dysplastic rectal polyp    Protein-calorie malnutrition, severe 04/07/2017   Pressure injury of skin 04/04/2017   Anticoagulated 04/03/2017   Stool bloody 04/03/2017   Current every day smoker 04/03/2017   Rectal bleeding    Chronic diarrhea    Hyperthyroidism    S/P ORIF (open reduction internal fixation) fracture right hip IM nail 03/07/17 03/24/2017   Hypomagnesemia 03/07/2017   Fall    Closed  intertrochanteric fracture of hip, right, initial encounter ([place]) 03/06/2017   Radial styloid fracture: right 03/06/2017   Normocytic anemia 03/06/2017   Orthostatic hypotension 06/07/2015   Lower urinary tract infectious disease 06/07/2015   Rhabdomyolysis 06/07/2015   White matter abnormality on MRI of brain 05/14/2013   History of depression 05/14/2013   Hx of anxiety disorder 05/14/2013   Bipolar I disorder, most recent episode (or current) manic, unspecified 05/14/2013   Bipolar 1 disorder ([place]) 05/14/2013   Hyponatremia 05/12/2013   Bipolar disorder ([place]) 05/12/2013   Lacunar infarct, acute ([place]) 05/12/2013   PCP:  Neale Burly, MD Pharmacy:   [facility], [place] [place] [address] Phone: [phone] Fax: [place], [place] - [place] [address] [place] [address] Phone: [phone] Fax: [phone]     Social Determinants of Health (SDOH) Interventions    Readmission Risk Interventions Readmission Risk Prevention Plan 07/26/2020  Transportation Screening Complete  Home Care Screening Complete  Medication Review (RN CM) Complete  Some recent data might be hidden     Final next level of care: Home w Home Health Services Barriers to Discharge: Barriers Resolved   Patient Goals and CMS Choice Patient states their goals for this hospitalization and ongoing recovery are:: Home with [facility]      Discharge Placement                       Discharge Plan and Services In-house Referral: Clinical Social Work   Post Acute Care Choice: Resumption of Svcs/PTA Provider                               Social Determinants of Health (SDOH) Interventions     Readmission Risk Interventions Readmission Risk Prevention Plan 07/26/2020  Transportation Screening Complete  Home Care Screening Complete  Medication Review (RN CM) Complete  Some recent data might be hidden

## 2020-08-18 ENCOUNTER — Other Ambulatory Visit: Payer: Self-pay

## 2020-08-18 DIAGNOSIS — F32A Depression, unspecified: Secondary | ICD-10-CM

## 2020-08-18 LAB — CULTURE, BLOOD (ROUTINE X 2)
Culture: NO GROWTH
Culture: NO GROWTH
Special Requests: ADEQUATE
Special Requests: ADEQUATE

## 2020-08-21 ENCOUNTER — Telehealth: Payer: Self-pay

## 2020-08-21 NOTE — Telephone Encounter (Signed)
Received message from Deloria Lair NP with [facility] providing update on patient and needed F/U. Will update Primary Palliative team

## 2020-08-21 NOTE — Telephone Encounter (Signed)
(  5:12 pm) Palliative Care SW attempted to call the patient to no avail/received no answer SW then called her son-Mark, who lives in [place] and he was able to share patient's status. Patient is receiving [facility]) services 5 hours in the morning 7am-1pm and then 4 hours in the evening 4pm-9pm. According to her son, patient is progressing in her dementia. She has a history of bipolar disorder and schizophrenia tendencies but that has been managed with medications for some time. Patient had a fall that resulted in a brain bleed that has caused a rapid progression of her dementia.  Mark advised SW to keep calling to schedule a visit as he also has difficulty getting through. He thinks his mother could benefit from palliative services.  SW to follow-up with patient.

## 2020-08-30 ENCOUNTER — Other Ambulatory Visit: Payer: Self-pay | Admitting: Internal Medicine

## 2020-08-30 ENCOUNTER — Other Ambulatory Visit (HOSPITAL_COMMUNITY): Payer: Self-pay | Admitting: Internal Medicine

## 2020-08-30 DIAGNOSIS — M7989 Other specified soft tissue disorders: Secondary | ICD-10-CM

## 2020-08-30 DIAGNOSIS — M79661 Pain in right lower leg: Secondary | ICD-10-CM

## 2020-09-05 ENCOUNTER — Encounter (HOSPITAL_COMMUNITY): Payer: Self-pay

## 2020-09-05 ENCOUNTER — Ambulatory Visit (HOSPITAL_COMMUNITY): Admission: RE | Admit: 2020-09-05 | Payer: Medicare Other | Source: Ambulatory Visit

## 2020-09-13 ENCOUNTER — Encounter (HOSPITAL_COMMUNITY): Payer: Self-pay

## 2020-09-13 ENCOUNTER — Ambulatory Visit (HOSPITAL_COMMUNITY): Payer: Medicare Other

## 2020-09-14 ENCOUNTER — Telehealth: Payer: Self-pay

## 2020-09-14 NOTE — Telephone Encounter (Signed)
145 pm.  Phone call made to patient to schedule a home visit.  Patient is agreeable to Friday, September 16 @ 9 am.  Patient has requested a reminder call the day before her visit.

## 2020-09-29 ENCOUNTER — Other Ambulatory Visit: Payer: Medicare Other | Admitting: Nurse Practitioner

## 2020-09-29 ENCOUNTER — Encounter: Payer: Self-pay | Admitting: Nurse Practitioner

## 2020-09-29 ENCOUNTER — Other Ambulatory Visit: Payer: Self-pay

## 2020-09-29 DIAGNOSIS — Z515 Encounter for palliative care: Secondary | ICD-10-CM

## 2020-09-29 DIAGNOSIS — R531 Weakness: Secondary | ICD-10-CM

## 2020-09-29 DIAGNOSIS — F250 Schizoaffective disorder, bipolar type: Secondary | ICD-10-CM

## 2020-09-29 NOTE — Progress Notes (Signed)
[place] Consult Note Telephone: [phone]  Fax: [phone]    Date of encounter: 09/29/20 1:59 PM PATIENT NAME: Cassidy Smith [place] [address]   [phone] (home)  DOB: 1955-01-30 MRN: [id number] PRIMARY CARE PROVIDER:    Neale Burly, MD,  [place] [id number] [phone]  RESPONSIBLE PARTY:    Contact Information     Name Relation Home Work Hammett, Thedore Mins Son   [phone]   Willette Alma [place], [place]   [place]   [phone]      Due to the COVID-19 crisis, this visit was done via telemedicine from my office and it was initiated and consent by this patient and or family.  I connected with  Ms. Kalb brother, Mr Delorise Royals with [facility] on 09/29/20 by a telephone as video not available enabled telemedicine application and verified that I am speaking with the correct person   I discussed the limitations of evaluation and management by telemedicine. The patient expressed understanding and agreed to proceed. Palliative Care was asked to follow this patient by consultation request of  Hasanaj, Samul Dada, MD to address advance care planning and complex medical decision making. This is a follow up visit.                                  ASSESSMENT AND PLAN / RECOMMENDATIONS:  Symptom Management/Plan: 1. Advance Care Planning; DNR with MOST form in vynca  2. Palliative care encounter; Palliative care encounter; Palliative medicine team will continue to support patient, patient's family, and medical team. Visit consisted of counseling and education dealing with the complex and emotionally intense issues of symptom management and palliative care in the setting of serious and potentially life-threatening illness  3. 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  Maintain safety. Discussed fall prevention strategies to include consistent use of assistive deviece during ambulation. May consider PT/OT if patient able to participate.  4. Schizoaffective disorder/chronic anxiety with depression: No report of uncontrolled behaviorial concerns. Patient followed by Dr. Alden Server with [facility] center. Continue current plan of care. With Risperdal, Cogentin, and Risperdal.  Follow up Palliative Care Visit: Palliative care will continue to follow for complex medical decision making, advance care planning, and clarification of goals. Return 8 weeks or prn.  I spent 61 minutes providing this consultation. More than 50% of the time in this consultation was spent in counseling and care coordination.  PPS: 50%  Chief Complaint: Follow up palliative consult for complex medical decision making/symptoms management  HISTORY OF PRESENT ILLNESS:  ILYANNA Smith is a 65 y.o. year old female  with medical problems including Schizoaffective disorder, Bipolar Type, SIADH, chronic anxiety/depression, chronic thrombocytopenia, rectal cancer, chronic DVT, CKD, Neuromuscular disorder.I called Ms. Brossard brother, Mr Delorise Royals with Ms. Knoles for telephonic telemedicine as video not available. We talked about purpose of pc visit. We talked about how Cassidy Smith has been feeling. Mr. Delorise Royals endorses she has good days where she is more interactive, behaviors better, eating. Other days are more difficult with trying to go buy a car, more difficult redirecting behaviors, sleeping more, decrease appetite. We talked about daily routine. We talked about quality of life in the setting of mental illness, progression with functional limitations. We talked about  Ms. Mckissick having caregivers that help with ADL's, typically there in am. We talked about debility, weakness with unsteady gait. We talked about Ms. Clute current functional abilities. We talked about recent fall, no injury. We talked  about no recent hospitalization, wounds, infections per caregiver who also participated in Ms. Rieves visit. We talked about appetite, varies, though no noted weight loss or change in clothes size. We talked about medical goals. We talked about HCPOA which is her son, as Mr. Delorise Royals has passed that to him. Mr. Delorise Royals though has been Cassidy Smith caregiver for many years. Mr. 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 We reviewed PMH, social, chronic disease as Ms. Thornsbury is new to this provider. Reviewed family history. We talked about family dynamics. We talked about role pc in poc. Therapeutic listening, emotional support provided. Questions answered.   History obtained from review of EMR, discussion with primary team, and interview with family, facility staff/caregiver and/or Ms. Bagby.  I reviewed available labs, medications, imaging, studies and related documents from the EMR.  Records reviewed and summarized above.   ROS Full 14 system review of systems performed and negative with exception of: as per HPI.   Physical Exam: deferred Questions and concerns were addressed. The patient/family was encouraged to call with questions and/or concerns. My business card was provided. Provided general support and encouragement, no other unmet needs identified   Thank you for the opportunity to participate in the care of Cassidy Smith.  The palliative care team will continue to follow. Please call our office at [phone] if we can be of additional assistance.   This chart was dictated using voice recognition software.  Despite best efforts to proofread,  errors can occur which can change the documentation meaning.   Adar Rase Ihor Gully, NP

## 2020-11-14 ENCOUNTER — Other Ambulatory Visit: Payer: Self-pay

## 2020-11-14 ENCOUNTER — Other Ambulatory Visit: Payer: Medicare Other | Admitting: Nurse Practitioner

## 2020-11-14 ENCOUNTER — Encounter: Payer: Self-pay | Admitting: Nurse Practitioner

## 2020-11-14 DIAGNOSIS — R531 Weakness: Secondary | ICD-10-CM

## 2020-11-14 DIAGNOSIS — Z515 Encounter for palliative care: Secondary | ICD-10-CM

## 2020-11-14 DIAGNOSIS — F250 Schizoaffective disorder, bipolar type: Secondary | ICD-10-CM

## 2020-11-14 NOTE — Progress Notes (Signed)
[facility] Pall[facility] Consult Note Telephone: [phone]  Fax: [phone]    Date of encounter: 11/14/20 1:58 PM PATIENT NAME: Cassidy Smith [id number]   [phone] (home)  DOB: 09-Sep-1955 MRN: [id number] PRIMARY CARE PROVIDER:    Neale Burly, MD,  [place] [address] [phone] RESPONSIBLE PARTY:    Contact Information     Name Relation Home Work Cassidy Smith Son   [phone]   Cassidy Smith [place], [place]   Cassidy Smith   [phone]      I met face to face with patient and family in home. Palliative Care was asked to follow this patient by consultation request of  Cassidy Smith, Cassidy Dada, MD to address advance care planning and complex medical decision making. This is a follow up visit. ASSESSMENT AND PLAN / RECOMMENDATIONS:  Symptom Management/Plan: 1. Advance Care Planning; DNR with MOST form in vynca   2. Palliative care encounter; Palliative care encounter; Palliative medicine team will continue to support patient, patient's family, and medical team. Visit consisted of counseling and education dealing with the complex and emotionally intense issues of symptom management and palliative care in the setting of serious and potentially life-threatening illness   3. 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 Maintain safety. Discussed fall prevention strategies to include consistent use of assistive deviece during ambulation.    4. Schizoaffective disorder/chronic anxiety with depression: No report of uncontrolled behaviorial concerns. Patient followed by Dr. Alden Smith with [facility] center. Continue current plan of care. With Risperdal, Cogentin, and Risperdal.   Follow up Palliative Care Visit: Palliative care will continue to follow for complex medical decision making,  advance care planning, and clarification of goals. Return 8 weeks or prn.  Follow up Palliative Care Visit: Palliative care will continue to follow for complex medical decision making, advance care planning, and clarification of goals. Return 8 weeks or prn.  I spent 67 minutes providing this consultation. More than 50% of the time in this consultation was spent in counseling and care coordination.  PPS: 40% Chief Complaint: Follow up palliative consult for complex medical decision making  HISTORY OF PRESENT ILLNESS:  Cassidy Smith is a 65 y.o. year old female  with medical problems including Schizoaffective disorder, Bipolar Type, SIADH, chronic anxiety/depression, chronic thrombocytopenia, rectal cancer, chronic DVT, CKD, Neuromuscular disorder.I called Cassidy Smith brother, Cassidy Smith with Cassidy Smith to confirm PC visit and covid screening negative. I visited Cassidy Smith and Cassidy Smith in their home. I talked with Cassidy Smith prior to seeing Cassidy Smith. Cassidy Smith and I talked about past medical history, family history, family review, chronic disease progression. We talked about Cassidy Smith caregivers, symptoms including ros. We talked about Cassidy Smith daily routine,behaviors. Medical goals reviewed. Cassidy Smith talked about Ms Smith son is Cassidy Smith resides in [place]. I visited and observed Cassidy Smith, sitting in the chair in her room. Cassidy Smith was smiling, laughing, cooperative with assessment. Cassidy Smith and I talked about ros, sleeping which she naps but sleeps well at night. We talked about her appetite, food she likes to eat. We talked about daily routine including the game shows she enjoys to watch. Cassidy Smith was a bit distracted by the price is right game show. Cassidy Smith was interactive, appears stable. Emotional support provided. Reviewed visit with Cassidy Smith and  he will communicate with his nephew. We talked about plan for f/u pc visit in 2 months or sooner if declines. Cassidy.  Delorise Smith in agreement. Therapeutic listening, emotional support provided. Questions answered. Contact information provided.   History obtained from review of EMR, discussion with Brother, Cassidy Smith and Cassidy Smith.  I reviewed available labs, medications, imaging, studies and related documents from the EMR.  Records reviewed and summarized above.   ROS Full 10 system review of systems performed and negative with exception of: as per HPI.   Physical Exam: Constitutional: NAD General: frail appearing, thin, pleasant female  EYES: lids intact ENMT:oral mucous membranes moist CV: S1S2, RRR, no LE edema Pulmonary: LCTA, no increased work of breathing, no cough, room air Abdomen: intake 100%, normo-active BS + 4 quadrants, soft and non tender MSK: ambulatory with walker Skin: warm and dry Neuro:  + generalized weakness,  + cognitive impairment Psych: non-anxious affect, A and O x 2  Questions and concerns were addressed. The patient/family was encouraged to call with questions and/or concerns. My business card was provided. Provided general support and encouragement, no other unmet needs identified   Thank you for the opportunity to participate in the care of Cassidy Smith.  The palliative care team will continue to follow. Please call our office at [phone] if we can be of additional assistance.   This chart was dictated using voice recognition software.  Despite best efforts to proofread,  errors can occur which can change the documentation meaning.   Cassidy Blyden Z Briselda Naval, NP   COVID-19 PATIENT SCREENING TOOL Asked and negative response unless otherwise noted:   Have you had symptoms of covid, tested positive or been in contact with someone with symptoms/positive test in the past 5-10 days?  NO

## 2020-12-29 ENCOUNTER — Telehealth: Payer: Self-pay

## 2020-12-29 NOTE — Telephone Encounter (Signed)
Called patient and spoke with her brother, Lorrin Goodell, to see if it was okay if our Palliative RN could come out to see patient on 01/04/21 instead of our NP and he was in agreement with this.  F/u visit was scheduled for 9 AM.

## 2021-01-04 ENCOUNTER — Other Ambulatory Visit: Payer: Medicare Other

## 2021-01-04 ENCOUNTER — Other Ambulatory Visit: Payer: Self-pay

## 2021-01-04 VITALS — BP 106/58 | HR 72 | Temp 98.0°F | Resp 20

## 2021-01-04 DIAGNOSIS — Z515 Encounter for palliative care: Secondary | ICD-10-CM

## 2021-01-04 NOTE — Progress Notes (Signed)
PATIENT NAME: BRELEIGH CARPINO DOB: 03-29-1955 MRN: [id number]  PRIMARY CARE PROVIDER: Neale Burly, MD  RESPONSIBLE PARTY:  Acct ID - Guarantor Home Phone Work Phone Relationship Acct Type  [id number] - Sharlotte [phone]  Self P/F     [place], [place], [place] [address]    PLAN OF CARE and INTERVENTIONS:               1.  GOALS OF CARE/ ADVANCE CARE PLANNING:  Remain home with private caregivers and assistance of her brother.                2.  PATIENT/CAREGIVER EDUCATION:  Follow up with PCP regarding right arm pain after fall.                4. PERSONAL EMERGENCY PLAN:  Activate 911 for emergencies.               5.  DISEASE STATUS:  Joint visit completed with patient and private sitter [name].  Mobility:  Patient sustained a fall about 2 weeks ago.  She does not recall how this occurred but notes bruising and pain to the right forearm and elbow.   Swelling present around the elbow.  Patient has limited range of motion without experiencing pain.  Encouraged patient to follow up with PCP regarding elbow and forearm due to ongoing pain.  Patient received a new lift chair this week.  She is unable to get out of the chair without using a the lift.  Patient has a rolling walker she uses for safe ambulation.  Patient has a private caregiver present from 8-12 during the 4-9 pm and patient's brother is present throughout the day.  Life Alert in place.  Appetite:  Patient endorses a good appetite no weight loss noted.  Patient drinks Sun Drops 2 x a day and drinks water during the day.   Anxiety/Depression:  Patient denies any issues with anxiety or depression.  No feelings of sadness.  Feels she is well managed with current medication regimen.    HISTORY OF PRESENT ILLNESS:  RHEALYNN MYHRE is a 65 y.o. year old female  with medical problems including Schizoaffective disorder, Bipolar Type, SIADH, chronic anxiety/depression, chronic thrombocytopenia, rectal cancer, chronic  DVT, CKD, Neuromuscular disorder.  Patient is being followed by Palliative Care every 8 weeks and PRN.   CODE STATUS: DNR ADVANCED DIRECTIVES: No MOST FORM: Yes PPS: 40%   PHYSICAL EXAM:   VITALS: Today's Vitals   01/04/21 0925  BP: (!) 106/58  Pulse: 72  Resp: 20  Temp: 98 F (36.7 C)  SpO2: 92%  PainSc: 2     LUNGS: clear to auscultation  CARDIAC: Cor RRR}  EXTREMITIES: - for edema SKIN: Skin color, texture, turgor normal. No rashes or lesions or mobility and turgor normal  NEURO: positive for gait problems, headaches, and memory problems       Lorenza Burton, RN

## 2021-03-15 ENCOUNTER — Encounter: Payer: Self-pay | Admitting: Nurse Practitioner

## 2021-03-15 ENCOUNTER — Other Ambulatory Visit: Payer: Self-pay

## 2021-03-15 ENCOUNTER — Other Ambulatory Visit: Payer: Medicare Other | Admitting: Nurse Practitioner

## 2021-03-15 DIAGNOSIS — R531 Weakness: Secondary | ICD-10-CM

## 2021-03-15 DIAGNOSIS — R630 Anorexia: Secondary | ICD-10-CM

## 2021-03-15 DIAGNOSIS — Z515 Encounter for palliative care: Secondary | ICD-10-CM

## 2021-03-15 DIAGNOSIS — F02818 Dementia in other diseases classified elsewhere, unspecified severity, with other behavioral disturbance: Secondary | ICD-10-CM

## 2021-03-15 NOTE — Progress Notes (Signed)
? ? ?Manufacturing engineer ?Community Palliative Care Consult Note ?Telephone: [phone]  ?Fax: [phone]  ? ? ?Date of encounter: 03/15/21 ?1:12 PM ?PATIENT NAME: Cassidy Smith ?Cassidy Smith [id number]   ?[phone] (home)  ?DOB: 02-24-1955 ?MRN: [id number] ?PRIMARY CARE PROVIDER:    ?Cassidy Burly, MD,  ?[place] ?[place] [address] ?[phone] ?RESPONSIBLE PARTY:    ?Contact Information   ? ? Name Relation Home Work Mobile  ? [place], [place] Son   [phone]  ? Lorrin Goodell Brother [phone]    ? [place], [place]  ? [place] Moos   [phone]  ? ?  ? ?Due to the COVID-19 crisis, this visit was done via telemedicine from my office and it was initiated and consent by this patient and or family. ? ?I connected with  Cassidy Smith on 03/15/21 by a telephone as video not available enabled telemedicine application and verified that I am speaking with the correct person using two identifiers. ?  ?I discussed the limitations of evaluation and management by telemedicine. The patient expressed understanding and agreed to proceed. Palliative Care was asked to follow this patient by consultation request of  Hasanaj, Samul Dada, MD to address advance care planning and complex medical decision making. This is a follow up visit ?ASSESSMENT AND PLAN / RECOMMENDATIONS:  ?Symptom Management/Plan: ?1. Advance Care Planning; DNR with MOST form in vynca ?  ?2. Palliative care encounter; Palliative care encounter; Palliative medicine team will continue to support patient, patient's family, and medical team. Visit consisted of counseling and education dealing with the complex and emotionally intense issues of symptom management and palliative care in the setting of serious and potentially life-threatening illness ?  ?3. 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 Maintain safety. Discussed fall  prevention as she is now non-ambulatory requires a left for transfers.  ?  ?4. Schizoaffective disorder/chronic anxiety with depression: No report of uncontrolled behaviorial concerns. Patient followed by Dr. Alden Smith with [facility] center. Continue current plan of care. With Risperdal, Cogentin, and Risperdal. ? ?5. 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 Talked about weights, loss. Snacks.  ?[address] weight 119.14; no further weights;  ?08/12/2020 albumin 3.2/total protein 7.0 ? ?Follow up Palliative Care Visit: Palliative care will continue to follow for complex medical decision making, advance care planning, and clarification of goals. Return 2 to 4 weeks or prn. ? ?I spent 32 minutes providing this consultation. More than 50% of the time in this consultation was spent in counseling and care coordination ? ?PPS: 30% ?Chief Complaint: Follow up palliative consult for complex medical decision making ? ?HISTORY OF PRESENT ILLNESS:  Cassidy Smith is a 66 y.o. year old female  with medical problems including Schizoaffective disorder, Bipolar Type, SIADH, chronic anxiety/depression, chronic thrombocytopenia, rectal cancer, chronic DVT, CKD, Neuromuscular disorder.I called Cassidy Smith for f/u palliative consult telephonic, telemedicine as video is not available. Cassidy Smith in agreement. Cassidy Smith and I talked about how she has been feeling. Cassidy Smith endorses she has been feeling better, feels like she is getting stronger. Cassidy Smith endorses she continues to require to be lifted for transfers, total ADL care including toileting. Cassidy Smith endorses she feeds herself with decrease appetite with ongoing weight loss. We talked about her daily routine. We talked about ros, symptoms. We talked about sleep, Cassidy Smith endorses she has  been sleeping well, napping during the day. We talked about medical goals, though limited. I attempted to  contact Cassidy Smith brother, Cassidy Smith for further update. Cassidy Smith and I talked about the tv shows she watches. We talked about f/u pc visit with PC RN in 2 weeks, Cassidy Smith in agreement. Therapeutic listening, emotional support provided. Questions answered. Contact information provided.  ? ?History obtained from review of EMR, discussion with primary team, and interview with family, facility staff/caregiver and/or Cassidy Smith.  ?I reviewed available labs, medications, imaging, studies and related documents from the EMR.  Records reviewed and summarized above.  ? ?ROS ?10 point system reviewed with Cassidy Smith, all negative except HPI ? ?Physical Exam: ?deferred ?Thank you for the opportunity to participate in the care of Cassidy Smith.  The palliative care team will continue to follow. Please call our office at [phone] if we can be of additional assistance.  ?Cassidy Haapala Z Arayah Krouse, NP  ? ? ?

## 2021-06-27 ENCOUNTER — Other Ambulatory Visit: Payer: Medicare Other

## 2021-06-27 DIAGNOSIS — Z515 Encounter for palliative care: Secondary | ICD-10-CM

## 2021-06-27 NOTE — Progress Notes (Signed)
Palliative care SW outreached patient to complete telephonic visit.    Palliative care SW outreached patient to complete telephonic visit. Patient provided update on medical condition and/or changes.   Patient endorses that she is doing well. With no changes since previous PC visit. Continues to require assistance with care and ADL's.   Hospitalizations: none recently.  Appetite: patients appetite remains fair - good. No significant weight changes noted.    No medication needs or adjustments noted. Patient is compliant with medications.    Psychosocial assessment: Completed. No financial, food insecurities or issues with transportation noted during call. No other psychosocial needs. Patient continue to receive [place] support from [facility] for Schizophrenia and anxiety. No S/S of depression or anxiety noted.   Patient declined the need of a PC home visit. Patient is open to [facility] completing a telephonic check in 6-8 weeks.   Palliative care will continue to monitor and assist with long term care planning as needed.

## 2021-07-26 ENCOUNTER — Other Ambulatory Visit: Payer: Medicare Other | Admitting: Nurse Practitioner

## 2021-07-26 ENCOUNTER — Telehealth: Payer: Self-pay | Admitting: Nurse Practitioner

## 2021-07-26 NOTE — Telephone Encounter (Signed)
I called multiple times for f/u pc visit telemedicine, phone number busy unable to leave a message

## 2022-02-15 ENCOUNTER — Other Ambulatory Visit: Payer: Self-pay

## 2022-02-15 ENCOUNTER — Inpatient Hospital Stay (HOSPITAL_COMMUNITY)
Admission: EM | Admit: 2022-02-15 | Discharge: 2022-02-24 | DRG: 682 | Disposition: A | Discharge status: Skilled Nursing Facility | Payer: Medicare Other | Attending: Internal Medicine | Admitting: Internal Medicine

## 2022-02-15 ENCOUNTER — Emergency Department (HOSPITAL_COMMUNITY): Payer: Medicare Other

## 2022-02-15 ENCOUNTER — Encounter (HOSPITAL_COMMUNITY): Payer: Self-pay

## 2022-02-15 DIAGNOSIS — Z8659 Personal history of other mental and behavioral disorders: Secondary | ICD-10-CM | POA: Diagnosis not present

## 2022-02-15 DIAGNOSIS — R296 Repeated falls: Secondary | ICD-10-CM | POA: Diagnosis not present

## 2022-02-15 DIAGNOSIS — R9082 White matter disease, unspecified: Secondary | ICD-10-CM | POA: Diagnosis present

## 2022-02-15 DIAGNOSIS — Y92009 Unspecified place in unspecified non-institutional (private) residence as the place of occurrence of the external cause: Secondary | ICD-10-CM

## 2022-02-15 DIAGNOSIS — R0609 Other forms of dyspnea: Secondary | ICD-10-CM | POA: Diagnosis not present

## 2022-02-15 DIAGNOSIS — E039 Hypothyroidism, unspecified: Secondary | ICD-10-CM | POA: Diagnosis present

## 2022-02-15 DIAGNOSIS — Z7901 Long term (current) use of anticoagulants: Secondary | ICD-10-CM

## 2022-02-15 DIAGNOSIS — Z66 Do not resuscitate: Secondary | ICD-10-CM | POA: Diagnosis present

## 2022-02-15 DIAGNOSIS — S82841A Displaced bimalleolar fracture of right lower leg, initial encounter for closed fracture: Secondary | ICD-10-CM | POA: Diagnosis present

## 2022-02-15 DIAGNOSIS — I825Z9 Chronic embolism and thrombosis of unspecified deep veins of unspecified distal lower extremity: Secondary | ICD-10-CM | POA: Diagnosis present

## 2022-02-15 DIAGNOSIS — S82891A Other fracture of right lower leg, initial encounter for closed fracture: Secondary | ICD-10-CM | POA: Diagnosis not present

## 2022-02-15 DIAGNOSIS — Z87891 Personal history of nicotine dependence: Secondary | ICD-10-CM | POA: Diagnosis not present

## 2022-02-15 DIAGNOSIS — J9601 Acute respiratory failure with hypoxia: Secondary | ICD-10-CM

## 2022-02-15 DIAGNOSIS — Z8673 Personal history of transient ischemic attack (TIA), and cerebral infarction without residual deficits: Secondary | ICD-10-CM | POA: Diagnosis not present

## 2022-02-15 DIAGNOSIS — I110 Hypertensive heart disease with heart failure: Secondary | ICD-10-CM | POA: Diagnosis present

## 2022-02-15 DIAGNOSIS — F319 Bipolar disorder, unspecified: Secondary | ICD-10-CM | POA: Diagnosis present

## 2022-02-15 DIAGNOSIS — D649 Anemia, unspecified: Secondary | ICD-10-CM | POA: Diagnosis present

## 2022-02-15 DIAGNOSIS — F17200 Nicotine dependence, unspecified, uncomplicated: Secondary | ICD-10-CM | POA: Diagnosis not present

## 2022-02-15 DIAGNOSIS — E222 Syndrome of inappropriate secretion of antidiuretic hormone: Secondary | ICD-10-CM | POA: Diagnosis not present

## 2022-02-15 DIAGNOSIS — W19XXXA Unspecified fall, initial encounter: Secondary | ICD-10-CM | POA: Diagnosis present

## 2022-02-15 DIAGNOSIS — Z1152 Encounter for screening for COVID-19: Secondary | ICD-10-CM | POA: Diagnosis not present

## 2022-02-15 DIAGNOSIS — N179 Acute kidney failure, unspecified: Principal | ICD-10-CM | POA: Diagnosis present

## 2022-02-15 DIAGNOSIS — J449 Chronic obstructive pulmonary disease, unspecified: Secondary | ICD-10-CM | POA: Diagnosis present

## 2022-02-15 DIAGNOSIS — D72829 Elevated white blood cell count, unspecified: Secondary | ICD-10-CM | POA: Diagnosis present

## 2022-02-15 DIAGNOSIS — F419 Anxiety disorder, unspecified: Secondary | ICD-10-CM | POA: Diagnosis present

## 2022-02-15 DIAGNOSIS — F0390 Unspecified dementia without behavioral disturbance: Secondary | ICD-10-CM | POA: Diagnosis not present

## 2022-02-15 DIAGNOSIS — Z79899 Other long term (current) drug therapy: Secondary | ICD-10-CM | POA: Diagnosis not present

## 2022-02-15 DIAGNOSIS — Z9181 History of falling: Secondary | ICD-10-CM

## 2022-02-15 DIAGNOSIS — E860 Dehydration: Secondary | ICD-10-CM | POA: Diagnosis present

## 2022-02-15 DIAGNOSIS — J690 Pneumonitis due to inhalation of food and vomit: Secondary | ICD-10-CM | POA: Diagnosis present

## 2022-02-15 DIAGNOSIS — R1310 Dysphagia, unspecified: Secondary | ICD-10-CM | POA: Diagnosis present

## 2022-02-15 DIAGNOSIS — Z87440 Personal history of urinary (tract) infections: Secondary | ICD-10-CM

## 2022-02-15 DIAGNOSIS — Z803 Family history of malignant neoplasm of breast: Secondary | ICD-10-CM | POA: Diagnosis not present

## 2022-02-15 DIAGNOSIS — Z811 Family history of alcohol abuse and dependence: Secondary | ICD-10-CM | POA: Diagnosis not present

## 2022-02-15 DIAGNOSIS — I2510 Atherosclerotic heart disease of native coronary artery without angina pectoris: Secondary | ICD-10-CM | POA: Diagnosis not present

## 2022-02-15 DIAGNOSIS — I6381 Other cerebral infarction due to occlusion or stenosis of small artery: Secondary | ICD-10-CM | POA: Diagnosis present

## 2022-02-15 DIAGNOSIS — Z86718 Personal history of other venous thrombosis and embolism: Secondary | ICD-10-CM

## 2022-02-15 DIAGNOSIS — K219 Gastro-esophageal reflux disease without esophagitis: Secondary | ICD-10-CM | POA: Diagnosis present

## 2022-02-15 DIAGNOSIS — E0590 Thyrotoxicosis, unspecified without thyrotoxic crisis or storm: Secondary | ICD-10-CM | POA: Diagnosis present

## 2022-02-15 DIAGNOSIS — S2242XA Multiple fractures of ribs, left side, initial encounter for closed fracture: Secondary | ICD-10-CM | POA: Diagnosis present

## 2022-02-15 DIAGNOSIS — Z7982 Long term (current) use of aspirin: Secondary | ICD-10-CM

## 2022-02-15 DIAGNOSIS — F0153 Vascular dementia, unspecified severity, with mood disturbance: Secondary | ICD-10-CM | POA: Diagnosis present

## 2022-02-15 DIAGNOSIS — Z9889 Other specified postprocedural states: Secondary | ICD-10-CM

## 2022-02-15 DIAGNOSIS — R0902 Hypoxemia: Secondary | ICD-10-CM

## 2022-02-15 DIAGNOSIS — I5031 Acute diastolic (congestive) heart failure: Secondary | ICD-10-CM | POA: Diagnosis not present

## 2022-02-15 DIAGNOSIS — Z8781 Personal history of (healed) traumatic fracture: Secondary | ICD-10-CM

## 2022-02-15 DIAGNOSIS — F3110 Bipolar disorder, current episode manic without psychotic features, unspecified: Secondary | ICD-10-CM | POA: Diagnosis present

## 2022-02-15 DIAGNOSIS — R54 Age-related physical debility: Secondary | ICD-10-CM | POA: Diagnosis present

## 2022-02-15 LAB — CK: Total CK: 114 U/L (ref 38–234)

## 2022-02-15 LAB — CBC
HCT: 34.7 % — ABNORMAL LOW (ref 36.0–46.0)
HCT: 31.6 % — ABNORMAL LOW (ref 36.0–46.0)
Hemoglobin: 11.1 g/dL — ABNORMAL LOW (ref 12.0–15.0)
Hemoglobin: 10 g/dL — ABNORMAL LOW (ref 12.0–15.0)
MCH: 30.6 pg (ref 26.0–34.0)
MCH: 30.3 pg (ref 26.0–34.0)
MCHC: 32 g/dL (ref 30.0–36.0)
MCHC: 31.6 g/dL (ref 30.0–36.0)
MCV: 95.6 fL (ref 80.0–100.0)
MCV: 95.8 fL (ref 80.0–100.0)
Platelets: 115 10*3/uL — ABNORMAL LOW (ref 150–400)
Platelets: 110 10*3/uL — ABNORMAL LOW (ref 150–400)
RBC: 3.63 MIL/uL — ABNORMAL LOW (ref 3.87–5.11)
RBC: 3.3 MIL/uL — ABNORMAL LOW (ref 3.87–5.11)
RDW: 15.6 % — ABNORMAL HIGH (ref 11.5–15.5)
RDW: 15.7 % — ABNORMAL HIGH (ref 11.5–15.5)
WBC: 10.7 10*3/uL — ABNORMAL HIGH (ref 4.0–10.5)
WBC: 9.6 10*3/uL (ref 4.0–10.5)
nRBC: 0 % (ref 0.0–0.2)
nRBC: 0 % (ref 0.0–0.2)

## 2022-02-15 LAB — BASIC METABOLIC PANEL
Anion gap: 11 (ref 5–15)
BUN: 48 mg/dL — ABNORMAL HIGH (ref 8–23)
CO2: 32 mmol/L (ref 22–32)
Calcium: 9.2 mg/dL (ref 8.9–10.3)
Chloride: 94 mmol/L — ABNORMAL LOW (ref 98–111)
Creatinine, Ser: 3.46 mg/dL — ABNORMAL HIGH (ref 0.44–1.00)
GFR, Estimated: 14 mL/min — ABNORMAL LOW (ref >60)
Glucose, Bld: 110 mg/dL — ABNORMAL HIGH (ref 70–99)
Potassium: 4.6 mmol/L (ref 3.5–5.1)
Sodium: 137 mmol/L (ref 135–145)

## 2022-02-15 LAB — CREATININE, SERUM
Creatinine, Ser: 3.09 mg/dL — ABNORMAL HIGH (ref 0.44–1.00)
GFR, Estimated: 16 mL/min — ABNORMAL LOW (ref >60)

## 2022-02-15 LAB — HIV ANTIBODY (ROUTINE TESTING W REFLEX): HIV Screen 4th Generation wRfx: NONREACTIVE

## 2022-02-15 MED ORDER — SODIUM CHLORIDE 0.9 % IV SOLN
1.0000 g | INTRAVENOUS | Status: AC
  Administered 2022-02-16 – 2022-02-19 (×4): 1 g via INTRAVENOUS
  Filled 2022-02-15 (×4): qty 10

## 2022-02-15 MED ORDER — ONDANSETRON HCL 4 MG/2ML IJ SOLN: 4.0000 mg | Freq: Four times a day (QID) | INTRAMUSCULAR | Status: DC | PRN

## 2022-02-15 MED ORDER — NICOTINE 21 MG/24HR TD PT24
21.0000 mg | MEDICATED_PATCH | Freq: Every day | TRANSDERMAL | Status: DC
  Administered 2022-02-15 – 2022-02-24 (×10): 21 mg via TRANSDERMAL
  Filled 2022-02-15 (×10): qty 1

## 2022-02-15 MED ORDER — ACETAMINOPHEN 650 MG RE SUPP
650.0000 mg | Freq: Four times a day (QID) | RECTAL | Status: DC
  Administered 2022-02-17 – 2022-02-18 (×2): 650 mg via RECTAL
  Filled 2022-02-15 (×5): qty 1

## 2022-02-15 MED ORDER — LACTATED RINGERS IV SOLN: INTRAVENOUS | Status: DC

## 2022-02-15 MED ORDER — ONDANSETRON HCL 4 MG PO TABS: 4.0000 mg | ORAL_TABLET | Freq: Four times a day (QID) | ORAL | Status: DC | PRN

## 2022-02-15 MED ORDER — SODIUM CHLORIDE 0.9 % IV SOLN
2.0000 g | INTRAVENOUS | Status: AC
  Administered 2022-02-15: 2 g via INTRAVENOUS
  Filled 2022-02-15: qty 20

## 2022-02-15 MED ORDER — SODIUM CHLORIDE 0.9 % IV SOLN
500.0000 mg | INTRAVENOUS | Status: AC
  Administered 2022-02-16 – 2022-02-19 (×4): 500 mg via INTRAVENOUS
  Filled 2022-02-15 (×4): qty 5

## 2022-02-15 MED ORDER — OXYCODONE HCL 5 MG PO TABS
5.0000 mg | ORAL_TABLET | Freq: Four times a day (QID) | ORAL | Status: DC | PRN
  Administered 2022-02-16 – 2022-02-18 (×3): 5 mg via ORAL
  Filled 2022-02-15 (×3): qty 1

## 2022-02-15 MED ORDER — DEXTROMETHORPHAN POLISTIREX ER 30 MG/5ML PO SUER
30.0000 mg | Freq: Two times a day (BID) | ORAL | Status: AC
  Administered 2022-02-15 – 2022-02-18 (×5): 30 mg via ORAL
  Filled 2022-02-15 (×11): qty 5

## 2022-02-15 MED ORDER — IPRATROPIUM-ALBUTEROL 0.5-2.5 (3) MG/3ML IN SOLN
3.0000 mL | Freq: Three times a day (TID) | RESPIRATORY_TRACT | Status: DC
  Administered 2022-02-15 (×2): 3 mL via RESPIRATORY_TRACT
  Filled 2022-02-15 (×2): qty 3

## 2022-02-15 MED ORDER — SODIUM CHLORIDE 0.9 % IV SOLN
500.0000 mg | INTRAVENOUS | Status: AC
  Administered 2022-02-15: 500 mg via INTRAVENOUS
  Filled 2022-02-15: qty 5

## 2022-02-15 MED ORDER — BISACODYL 5 MG PO TBEC: 5.0000 mg | DELAYED_RELEASE_TABLET | Freq: Every day | ORAL | Status: DC | PRN

## 2022-02-15 MED ORDER — LACTATED RINGERS IV BOLUS
1000.0000 mL | Freq: Once | INTRAVENOUS | Status: AC
  Administered 2022-02-15: 1000 mL via INTRAVENOUS

## 2022-02-15 MED ORDER — HEPARIN SODIUM (PORCINE) 5000 UNIT/ML IJ SOLN
5000.0000 [IU] | Freq: Three times a day (TID) | INTRAMUSCULAR | Status: DC
  Administered 2022-02-15 – 2022-02-21 (×17): 5000 [IU] via SUBCUTANEOUS
  Filled 2022-02-15 (×17): qty 1

## 2022-02-15 MED ORDER — FENTANYL CITRATE PF 50 MCG/ML IJ SOSY: 12.5000 ug | PREFILLED_SYRINGE | INTRAMUSCULAR | Status: DC | PRN

## 2022-02-15 MED ORDER — ACETAMINOPHEN 325 MG PO TABS
650.0000 mg | ORAL_TABLET | Freq: Four times a day (QID) | ORAL | Status: DC
  Administered 2022-02-15 – 2022-02-24 (×33): 650 mg via ORAL
  Filled 2022-02-15 (×34): qty 2

## 2022-02-15 MED ORDER — IPRATROPIUM-ALBUTEROL 0.5-2.5 (3) MG/3ML IN SOLN
3.0000 mL | Freq: Three times a day (TID) | RESPIRATORY_TRACT | Status: DC
  Administered 2022-02-16 (×3): 3 mL via RESPIRATORY_TRACT
  Filled 2022-02-15 (×3): qty 3

## 2022-02-15 MED ORDER — LIDOCAINE 5 % EX PTCH
1.0000 | MEDICATED_PATCH | CUTANEOUS | Status: DC
  Administered 2022-02-15 – 2022-02-24 (×10): 1 via TRANSDERMAL
  Filled 2022-02-15 (×10): qty 1

## 2022-02-15 MED ORDER — HYDRALAZINE HCL 20 MG/ML IJ SOLN: 10.0000 mg | INTRAMUSCULAR | Status: DC | PRN

## 2022-02-15 NOTE — H&P (Signed)
History and Physical  [place] GYJ:[id number] DOB: [birth year] DOA: 02/15/2022  PCP: Neale Burly, MD  Patient coming from: Home by [facility]  Level of care: Med-Surg  I have personally briefly reviewed patient's old medical records in [place]  Chief Complaint: Fall at home   HPI: Cassidy Smith is a 67 year old female with history of depression, anxiety, bipolar 1 disorder, chronic DVT, chronic hip pain, dementia, history of UTIs and hypothyroidism, previous intracerebral hematoma, history of hemorrhagic CVA, SIADH, DNR present on admission, history of rhabdomyolysis who apparently has been living alone but having care aides that are with her during the daytime however she remains alone at night.  Historian reported that patient has been having frequent falls recently at home and gait instability which is worsened over the past 3 to 4 days.  She also is having intermittent confusion which is worse from her baseline.  She has had poor oral intake over the past several days according to caretakers.  Patient denies having fever and chills.  Patient denies chest pain and reports having cough and chest congestion.  Patient was found down this morning by her care aides.  She apparently had fallen sometime in the middle of the night.  She had noticeable bruising and swelling to her right ankle and she hit her head.  She has bruising on her left rib cage.  Patient normally ambulates with a walker.  Family reports that patient has not been at her baseline mentation for the past several days.  Workup in the emergency department reveals that patient has severe acute kidney injury, she has a fractured right ankle and multiple rib fractures.  She also has evidence of an aspiration pneumonitis and she was started on IV fluids and IV antibiotics.  Admission was requested for further management.    Past Medical History:  Diagnosis Date   Anxiety    Bipolar 1 disorder ([place])     Chronic deep vein thrombosis (DVT) of distal vein of lower extremity ([place]) 07/10/2017   Chronic hip pain    Depression    GI bleed    Headache(784.0)    History of UTI/bladder spams.    Hyperthyroidism    Hypokalemia 08/15/2017   Intracerebral hematoma ([place]) 07/04/2019   Ischemic stroke (HCC)    Neuromuscular disorder (HCC)    shaking of hands    Pneumonia of right lower lobe due to infectious organism 11/06/2019   Radial styloid fracture: right 03/06/2017   Rhabdomyolysis 06/07/2015   SIADH (syndrome of inappropriate ADH production) ([place]) 04/08/2017   Thyrotoxicosis, unspecified with thyrotoxic crisis or storm 01/15/2019    Past Surgical History:  Procedure Laterality Date   COLONOSCOPY WITH PROPOFOL N/A 04/05/2017   Procedure: COLONOSCOPY WITH PROPOFOL;  Surgeon: Rogene Houston, MD;  [facility];  Service: Endoscopy;  Laterality: N/A;   INTRAMEDULLARY (IM) NAIL INTERTROCHANTERIC Right 03/07/2017   Procedure: OPEN TREATMENT INTERNAL FIXATION RIGHT HIP WITH GAMA INTRAMEDULARY NAIL;  Surgeon: Carole Civil, MD;  Location: [facility];  Service: Orthopedics;  Laterality: Right;   MULTIPLE EXTRACTIONS WITH ALVEOLOPLASTY N/A 10/30/2012   Procedure: MULTIPLE EXTRACION 5, 6, 8, 9, 10 ,18, 19, 31 WITH MAXILLARY RIGHT AND LEFT  ALVEOLOPLASTY REDUCE MAXILLARY LEFT TUBEROSITY;  Surgeon: Gae Bon, DDS;  Location: [place];  Service: Oral Surgery;  Laterality: N/A;   POLYPECTOMY  04/05/2017   Procedure: POLYPECTOMY;  Surgeon: Rogene Houston, MD;  [facility];  Service: Endoscopy;;  recto-sigmoid, rectum     reports that she quit smoking about 4 years ago. Her smoking use included cigarettes. She has a 10.00 pack-year smoking history. She has never used smokeless tobacco. She reports that she does not drink alcohol and does not use drugs.  No Known Allergies  Family History  Problem Relation Age of Onset   Breast cancer Mother    Cancer - Other Mother     Alcoholism Father    Colon cancer Neg Hx    Colon polyps Neg Hx     Prior to Admission medications   Medication Sig Start Date End Date Taking? Authorizing Provider  acetaminophen (TYLENOL) 325 MG tablet Take 2 tablets (650 mg total) by mouth every 6 (six) hours as needed for mild pain (or Fever >/= 101). 08/14/20   Roxan Hockey, MD  aspirin EC 81 MG tablet Take 1 tablet (81 mg total) by mouth daily with breakfast. Swallow whole. 08/14/20   Roxan Hockey, MD  atorvastatin (LIPITOR) 20 MG tablet Take 1 tablet (20 mg total) by mouth daily at 6 PM. 06/11/17   Barton Dubois, MD  benztropine (COGENTIN) 1 MG tablet Take 1 tablet (1 mg total) by mouth 2 (two) times daily. 01/05/18   Salam Chesterfield L, MD  Cholecalciferol (VITAMIN D-3) 125 MCG (5000 UT) TABS Take 5,000 Units by mouth daily.    [provider]  divalproex (DEPAKOTE ER) 500 MG 24 hr tablet Take 2 tablets (1,000 mg total) by mouth at bedtime. 08/16/17   Orson Eva, MD  feeding supplement (ENSURE ENLIVE / ENSURE PLUS) LIQD Take 237 mLs by mouth 2 (two) times daily between meals. 08/15/20   Roxan Hockey, MD  furosemide (LASIX) 20 MG tablet Take 10 mg by mouth daily. 07/06/20   [provider]  gabapentin (NEURONTIN) 100 MG capsule Take 1 capsule (100 mg total) by mouth 3 (three) times daily. 05/13/17   Carole Civil, MD  methimazole (TAPAZOLE) 10 MG tablet Take 10 mg by mouth every other day. 07/06/20   [provider]  Multiple Vitamin (MULTIVITAMIN WITH MINERALS) TABS tablet Take 1 tablet by mouth daily. Centrum Silver    [provider]  pantoprazole (PROTONIX) 40 MG tablet Take 40 mg by mouth daily.    [provider]  potassium chloride (K-DUR,KLOR-CON) 10 MEQ tablet Take 1 tablet (10 mEq total) by mouth every other day. 01/08/18   Isaac Dubie L, MD  risperiDONE (RISPERDAL) 2 MG tablet Take 2 mg by mouth at bedtime. 06/21/19   [provider]  traZODone (DESYREL) 100 MG  tablet Take 200 mg by mouth at bedtime.    [provider]  vitamin B-12 (CYANOCOBALAMIN) 1000 MCG tablet Take 1 tablet (1,000 mcg total) by mouth daily. 06/11/17   Barton Dubois, MD    Physical Exam: Vitals:   02/15/22 1226 02/15/22 1231 02/15/22 1311  BP: (!) 91/51  (!) 170/113  Pulse: 85  76  Resp: 20  18  Temp: 98 F (36.7 C)    TempSrc: Oral    SpO2: 94%  95%  Weight:  49.9 kg   Height:  '5\' 5"'$  (1.651 m)     Constitutional: Frail, elderly female that seems older than stated age, confused, cooperative, NAD, calm, comfortable Eyes: PERRL, lids and conjunctivae normal ENMT: Mucous membranes are very dry and pale. Posterior pharynx clear of any exudate or lesions. edentulous.  Neck: normal, supple, no masses, no thyromegaly Respiratory: clear to auscultation bilaterally, no wheezing,  no crackles. Normal respiratory effort. No accessory muscle use.  Cardiovascular: normal s1, s2 sounds, no murmurs / rubs / gallops.  Right lower extremity edema. 2+ pedal pulses. No carotid bruits.  Abdomen: no tenderness, no masses palpated. No hepatosplenomegaly. Bowel sounds positive.  Musculoskeletal: Right ankle in cast, good pedal pulses palpated bilateral lower extremities, no clubbing / cyanosis.  Bilateral bruising along rib cage noted.  Good ROM, no contractures. Normal muscle tone.  Skin: no rashes, lesions, ulcers. No induration Neurologic: CN 2-12 grossly intact. Sensation intact, DTR normal. Strength 5/5 in all 4.  Psychiatric: Diminished judgment and insight. Alert and oriented x 3. Normal mood.   Labs on Admission: I have personally reviewed following labs and imaging studies  CBC: Recent Labs  Lab 02/15/22 1244  WBC 10.7*  HGB 11.1*  HCT 34.7*  MCV 95.6  PLT 093*   Basic Metabolic Panel: Recent Labs  Lab 02/15/22 1244  NA 137  K 4.6  CL 94*  CO2 32  GLUCOSE 110*  BUN 48*  CREATININE 3.46*  CALCIUM 9.2   GFR: Estimated Creatinine Clearance: 12.3 mL/min (A)  (by C-G formula based on SCr of 3.46 mg/dL (H)). Liver Function Tests: No results for input(s): "AST", "ALT", "ALKPHOS", "BILITOT", "PROT", "ALBUMIN" in the last 168 hours. No results for input(s): "LIPASE", "AMYLASE" in the last 168 hours. No results for input(s): "AMMONIA" in the last 168 hours. Coagulation Profile: No results for input(s): "INR", "PROTIME" in the last 168 hours. Cardiac Enzymes: Recent Labs  Lab 02/15/22 1244  CKTOTAL 114   BNP (last 3 results) No results for input(s): "PROBNP" in the last 8760 hours. HbA1C: No results for input(s): "HGBA1C" in the last 72 hours. CBG: No results for input(s): "GLUCAP" in the last 168 hours. Lipid Profile: No results for input(s): "CHOL", "HDL", "LDLCALC", "TRIG", "CHOLHDL", "LDLDIRECT" in the last 72 hours. Thyroid Function Tests: No results for input(s): "TSH", "T4TOTAL", "FREET4", "T3FREE", "THYROIDAB" in the last 72 hours. Anemia Panel: No results for input(s): "VITAMINB12", "FOLATE", "FERRITIN", "TIBC", "IRON", "RETICCTPCT" in the last 72 hours. Urine analysis:    Component Value Date/Time   COLORURINE YELLOW 08/12/2020 1339   APPEARANCEUR CLEAR 08/12/2020 1339   LABSPEC 1.015 08/12/2020 1339   PHURINE 8.0 08/12/2020 1339   GLUCOSEU NEGATIVE 08/12/2020 1339   HGBUR NEGATIVE 08/12/2020 1339   BILIRUBINUR NEGATIVE 08/12/2020 1339   KETONESUR 5 (A) 08/12/2020 1339   PROTEINUR NEGATIVE 08/12/2020 1339   UROBILINOGEN 0.2 05/12/2013 0210   NITRITE NEGATIVE 08/12/2020 1339   LEUKOCYTESUR NEGATIVE 08/12/2020 1339    Radiological Exams on Admission: CT Head Wo Contrast  Result Date: 02/15/2022 CLINICAL DATA:  Trauma EXAM: CT HEAD WITHOUT CONTRAST CT CERVICAL SPINE WITHOUT CONTRAST TECHNIQUE: Multidetector CT imaging of the head and cervical spine was performed following the standard protocol without intravenous contrast. Multiplanar CT image reconstructions of the cervical spine were also generated. RADIATION DOSE REDUCTION:  This exam was performed according to the departmental dose-optimization program which includes automated exposure control, adjustment of the mA and/or kV according to patient size and/or use of iterative reconstruction technique. COMPARISON:  CT C Spine 08/02/19, CT head 08/12/20 FINDINGS: CT HEAD FINDINGS Brain: No evidence of acute infarction, hemorrhage, hydrocephalus, extra-axial collection or mass lesion/mass effect. Redemonstrated is chronic infarct in the anterior right temporal lobe with mild ex vacuo dilatation of the right temporal horn. 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 Vascular: No hyperdense vessel or unexpected calcification. Skull: Normal. Negative for fracture  or focal lesion. Sinuses/Orbits: No middle ear or mastoid effusion. Paranasal sinuses are clear. Orbits are unremarkable. Other: None. CT CERVICAL SPINE FINDINGS Alignment: Trace anterolisthesis of C3 on C4, C4 on C5. Skull base and vertebrae: No acute fracture. No primary bone lesion or focal pathologic process. Disc space loss at C6-C7 has slightly progressed from prior exam. Soft tissues and spinal canal: No prevertebral fluid or swelling. No visible canal hematoma. Disc levels: No CT evidence of high-grade spinal canal stenosis. There is multilevel severe degenerative facet disease resulting in moderate neural foraminal stenosis at C3-C4 on the right and severe neural foraminal stenosis at C4-C5 bilaterally. Upper chest: See separately dictated CT chest for additional findings. Severe centrilobular emphysema. Other: None IMPRESSION: CT HEAD: 1. No acute intracranial abnormality. 2. 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 CT CERVICAL SPINE: No acute fracture or traumatic malalignment of the cervical spine. Emphysema (ICD10-J43.9). Electronically Signed   By: Marin Roberts M.D.   On: 02/15/2022 14:04   CT Cervical Spine Wo Contrast  Result Date:  02/15/2022 CLINICAL DATA:  Trauma EXAM: CT HEAD WITHOUT CONTRAST CT CERVICAL SPINE WITHOUT CONTRAST TECHNIQUE: Multidetector CT imaging of the head and cervical spine was performed following the standard protocol without intravenous contrast. Multiplanar CT image reconstructions of the cervical spine were also generated. RADIATION DOSE REDUCTION: This exam was performed according to the departmental dose-optimization program which includes automated exposure control, adjustment of the mA and/or kV according to patient size and/or use of iterative reconstruction technique. COMPARISON:  CT C Spine 08/02/19, CT head 08/12/20 FINDINGS: CT HEAD FINDINGS Brain: No evidence of acute infarction, hemorrhage, hydrocephalus, extra-axial collection or mass lesion/mass effect. Redemonstrated is chronic infarct in the anterior right temporal lobe with mild ex vacuo dilatation of the right temporal horn. 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 Vascular: No hyperdense vessel or unexpected calcification. Skull: Normal. Negative for fracture or focal lesion. Sinuses/Orbits: No middle ear or mastoid effusion. Paranasal sinuses are clear. Orbits are unremarkable. Other: None. CT CERVICAL SPINE FINDINGS Alignment: Trace anterolisthesis of C3 on C4, C4 on C5. Skull base and vertebrae: No acute fracture. No primary bone lesion or focal pathologic process. Disc space loss at C6-C7 has slightly progressed from prior exam. Soft tissues and spinal canal: No prevertebral fluid or swelling. No visible canal hematoma. Disc levels: No CT evidence of high-grade spinal canal stenosis. There is multilevel severe degenerative facet disease resulting in moderate neural foraminal stenosis at C3-C4 on the right and severe neural foraminal stenosis at C4-C5 bilaterally. Upper chest: See separately dictated CT chest for additional findings. Severe centrilobular emphysema. Other: None IMPRESSION: CT HEAD: 1. No acute  intracranial abnormality. 2. 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 CT CERVICAL SPINE: No acute fracture or traumatic malalignment of the cervical spine. Emphysema (ICD10-J43.9). Electronically Signed   By: Marin Roberts M.D.   On: 02/15/2022 14:04   CT Chest Wo Contrast  Result Date: 02/15/2022 CLINICAL DATA:  LEFT chest wall trauma. EXAM: CT CHEST WITHOUT CONTRAST TECHNIQUE: Multidetector CT imaging of the chest was performed following the standard protocol without IV contrast. RADIATION DOSE REDUCTION: This exam was performed according to the departmental dose-optimization program which includes automated exposure control, adjustment of the mA and/or kV according to patient size and/or use of iterative reconstruction technique. COMPARISON:  None Available. FINDINGS: Cardiovascular: No thoracic aortic aneurysm. Aortic atherosclerosis. Three-vessel coronary artery calcifications, particularly dense within the LEFT  anterior descending coronary artery. No clinically significant pericardial effusion. Mediastinum/Nodes: No hemorrhage or edema is seen within the mediastinum. No mass or enlarged lymph nodes are seen within the mediastinum. Esophagus is unremarkable. Trachea is unremarkable. Lungs/Pleura: Paraseptal and centrilobular emphysematous changes bilaterally, upper lobe predominant. Bibasilar consolidations, LEFT greater than RIGHT. Small bilateral pleural effusions. No pneumothorax. Upper Abdomen: Limited images of the upper abdomen are unremarkable. Musculoskeletal: Minimally displaced fractures involving the posterior and posterior-lateral segments of the LEFT eleventh rib. Additional slightly displaced fracture of the posterior-lateral LEFT tenth rib and LEFT lateral ninth rib. Chronic wedge compression deformity of the L1 vertebral body. Degenerative spondylosis of the thoracic spine, mild to moderate in degree. IMPRESSION: 1. Minimally displaced to slightly displaced  fractures of the LEFT ninth through eleventh ribs, as detailed above. 2. Bibasilar consolidations, LEFT greater than RIGHT, most likely a combination of atelectasis and pneumonia or aspiration. Favor at least some component of aspiration given the recent trauma. 3. Small bilateral pleural effusions. No pneumothorax. 4. Three-vessel coronary artery calcifications, particularly dense within the LEFT anterior descending coronary artery. Recommend correlation with any possible associated cardiac symptoms. 5. Chronic wedge compression deformity of the L1 vertebral body. Emphysema (ICD10-J43.9). Electronically Signed   By: Franki Cabot M.D.   On: 02/15/2022 14:03   DG Ankle Complete Right  Result Date: 02/15/2022 CLINICAL DATA:  Right ankle pain, status post fall EXAM: RIGHT ANKLE - COMPLETE 3+ VIEW COMPARISON:  08/11/2020 ankle radiographs FINDINGS: Comminuted, mildly displaced and impacted fracture of the distal fibula. Comminuted, mildly displaced and distracted fracture through the medial malleolus. Soft tissue swelling about the ankle. IMPRESSION: Comminuted, mildly displaced and impacted fractures through the distal fibula and medial malleolus. Electronically Signed   By: Merilyn Baba M.D.   On: 02/15/2022 13:09    EKG: Independently reviewed.  Normal sinus rhythm  Assessment/Plan Principal Problem:   Acute renal failure (ARF) (HCC) Active Problems:   Falls frequently   Lacunar infarct, acute (HCC)   White matter abnormality on MRI of brain   History of depression   Hx of anxiety disorder   Bipolar I disorder, most recent episode (or current) manic (HCC)   Normocytic anemia   Fall   S/P ORIF (open reduction internal fixation) fracture right hip IM nail 03/07/17   Anticoagulated   Current every day smoker   Hyperthyroidism   SIADH (syndrome of inappropriate ADH production) (HCC)   GERD (gastroesophageal reflux disease)   Dementia without behavioral disturbance (HCC)   Chronic deep vein  thrombosis (DVT) of distal vein of lower extremity (HCC)   Aspiration pneumonia (HCC)   Leukocytosis    Acute renal failure Prerenal secondary to severe dehydration Treating with IV fluid Renally dose medications Follow daily basic metabolic panel Bladder scan less than 50 cc noted  Aspiration pneumonia Continue antibiotics as ordered Dysphagia diet ordered with aspiration precautions Supplemental oxygen as needed Bronchodilators and cough suppressants as ordered SLP eval requested   Leukocytosis Secondary to pneumonia and aspiration Recheck CBC in a.m.  Right ankle fracture Discussed with orthopedist Dr. Amedeo Kinsman who recommends splinting and outpatient follow-up with orthopedics Pain management as ordered  Falls Frequently/High Fall Risk Fall precautions recommended PT evaluation requested   Bipolar 1 disorder Plan to resume home medications when reconciled Still waiting on home meds to be reconciled to resume home meds Delirium precautions recommended  Tobacco user Nicotine patch ordered Nurse to counsel on cessation ordered  Hyperthyroidism Plan to resume home methimazole when meds reconciled  DNR  present on admission Advance care planning documents have been scanned into the EMR and reviewed  Essential hypertension Resume home medications when reconciled   DVT prophylaxis: [place] heparin   Code Status: DNR  Family Communication: sister   Disposition Plan: TBD  Consults called:   Admission status: INP  Level of care: Med-Surg Irwin Brakeman MD [facility] How to contact the [facility] Attending or Consulting provider [place] or covering provider during after hours [place], for this patient?  Check the care team in [facility] and look for a) attending/consulting [facility] provider listed and b) the [facility] team listed Log into [email or web address] and use [place]'s universal password to access. If you do not have the password, please contact the hospital operator. Locate the  [facility] provider you are looking for under [facility] and page to a number that you can be directly reached. If you still have difficulty reaching the provider, please page the [facility] (Director on Call) for the Hospitalists listed on amion for assistance.   If 7PM-7AM, please contact night-coverage [email or web address] Password [facility]  02/15/2022, 3:04 PM

## 2022-02-15 NOTE — Hospital Course (Addendum)
67 year old female with history of depression, anxiety, bipolar 1 disorder, chronic DVT, chronic hip pain, dementia, history of UTIs and hypothyroidism, previous intracerebral hematoma, history of hemorrhagic CVA, SIADH, DNR present on admission, history of rhabdomyolysis who apparently has been living alone but having care aides that are with her during the daytime however she remains alone at night.  Historian reported that patient has been having frequent falls recently at home and gait instability which is worsened over the past 3 to 4 days.  She also is having intermittent confusion which is worse from her baseline.  She has had poor oral intake over the past several days according to caretakers.  Patient denies having fever and chills.  Patient denies chest pain and reports having cough and chest congestion.  Patient was found down this morning by her care aides.  She apparently had fallen sometime in the middle of the night.  She had noticeable bruising and swelling to her right ankle and she hit her head.  She has bruising on her left rib cage.  Patient normally ambulates with a walker.  Family reports that patient has not been at her baseline mentation for the past several days.  Workup in the emergency department reveals that patient has severe acute kidney injury, she has a fractured right ankle and multiple rib fractures.  She also has evidence of an aspiration pneumonitis and she was started on IV fluids and IV antibiotics.  Admission was requested for further management.

## 2022-02-15 NOTE — ED Notes (Signed)
Bladder scan showed 64 ml's

## 2022-02-15 NOTE — ED Provider Notes (Signed)
[facility] Provider Note   CSN: [id number] Arrival date & time: 02/15/22  1206     History  Chief Complaint  Patient presents with   Fall    Patient found on ground at [place] is a 67 y.o. female.  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  Patient unable to give history.  History obtained by her sister who states that over the past 2 weeks has had frequent falls.  Seem more confused than usual.  Has had a history of polypharmacy and they have been adjusting her medications recently.  Had a fall last night and unknown time was found on the ground by nursing aide.  Reports that she lives at home with nurse aides during the day but not at night.  Says she is not aware of any recent illnesses for her sister though when she has urinary tract infections to be asked similarly to this.  Per chart review appears to have neuromuscular disorder of unclear etiology.       Home Medications Prior to Admission medications   Medication Sig Start Date End Date Taking? Authorizing Provider  cefUROXime (CEFTIN) 250 MG tablet Take by mouth. 02/14/22  Yes [provider]  potassium chloride (KLOR-CON) 10 MEQ tablet Take 10 mEq by mouth daily. 01/16/22  Yes [provider]  acetaminophen (TYLENOL) 325 MG tablet Take 2 tablets (650 mg total) by mouth every 6 (six) hours as needed for mild pain (or Fever >/= 101). 08/14/20   Roxan Hockey, MD  aspirin EC 81 MG tablet Take 1 tablet (81 mg total) by mouth daily with breakfast. Swallow whole. 08/14/20   Roxan Hockey, MD  atorvastatin (LIPITOR) 20 MG tablet Take 1 tablet (20 mg total) by mouth daily at 6 PM. 06/11/17   Barton Dubois, MD  benztropine (COGENTIN) 1 MG tablet Take 1 tablet (1 mg total) by mouth 2 (two) times daily. 01/05/18   Johnson, Clanford L, MD   Cholecalciferol (VITAMIN D-3) 125 MCG (5000 UT) TABS Take 5,000 Units by mouth daily.    [provider]  divalproex (DEPAKOTE ER) 500 MG 24 hr tablet Take 2 tablets (1,000 mg total) by mouth at bedtime. 08/16/17   Orson Eva, MD  feeding supplement (ENSURE ENLIVE / ENSURE PLUS) LIQD Take 237 mLs by mouth 2 (two) times daily between meals. 08/15/20   Roxan Hockey, MD  furosemide (LASIX) 20 MG tablet Take 10 mg by mouth daily. 07/06/20   [provider]  gabapentin (NEURONTIN) 100 MG capsule Take 1 capsule (100 mg total) by mouth 3 (three) times daily. 05/13/17   Carole Civil, MD  methimazole (TAPAZOLE) 10 MG tablet Take 10 mg by mouth every other day. 07/06/20   [provider]  Multiple Vitamin (MULTIVITAMIN WITH MINERALS) TABS tablet Take 1 tablet by mouth daily. Centrum Silver    [provider]  pantoprazole (PROTONIX) 40 MG tablet Take 40 mg by mouth daily.    [provider]  potassium chloride (K-DUR,KLOR-CON) 10 MEQ tablet Take 1 tablet (10 mEq total) by mouth every other day. 01/08/18   Johnson, Clanford L, MD  risperiDONE (RISPERDAL) 2 MG tablet Take 2 mg by mouth at bedtime. 06/21/19   [provider]  traZODone (DESYREL) 100 MG tablet Take 200 mg by mouth at bedtime.    [provider]  vitamin B-12 (CYANOCOBALAMIN) 1000 MCG tablet Take 1 tablet (1,000 mcg total) by mouth daily. 06/11/17   Barton Dubois, MD      Allergies    Patient has no known allergies.    Review of Systems   Review of Systems  Physical Exam Updated Vital Signs BP 127/68 (BP Location: Left Arm)   Pulse (!) 121   Temp 98.2 F (36.8 C) (Oral)   Resp 16   Ht '5\' 5"'$  (1.651 m)   Wt 58 kg   SpO2 90%   BMI 21.28 kg/m  Physical Exam Vitals and nursing note reviewed.  Constitutional:      General: She is not in acute distress.    Appearance: Normal appearance. She is well-developed. She is not ill-appearing.  HENT:     Head: Normocephalic  and atraumatic.     Right Ear: External ear normal.     Left Ear: External ear normal.     Mouth/Throat:     Mouth: Mucous membranes are moist.     Pharynx: Oropharynx is clear.  Eyes:     Extraocular Movements: Extraocular movements intact.     Conjunctiva/sclera: Conjunctivae normal.     Pupils: Pupils are equal, round, and reactive to light.  Neck:     Comments: No C-spine midline tenderness to palpation Cardiovascular:     Rate and Rhythm: Normal rate and regular rhythm.     Pulses: Normal pulses.     Heart sounds: Normal heart sounds. No murmur heard. Pulmonary:     Effort: Pulmonary effort is normal. No respiratory distress.     Breath sounds: Normal breath sounds.     Comments: On 2 L nasal cannula. Abdominal:     General: Abdomen is flat. Bowel sounds are normal. There is no distension.     Palpations: Abdomen is soft. There is no mass.     Tenderness: There is no abdominal tenderness. There is no guarding.  Musculoskeletal:        General: No swelling or deformity. Normal range of motion.     Cervical back: Neck supple. No rigidity or tenderness.     Comments: No tenderness to palpation of midline thoracic or lumbar spine.  No step-offs palpated.  Tenderness to palpation of chest wall. No tenderness to palpation of bilateral clavicles.  No tenderness to palpation, bruising, or deformities noted of bilateral shoulders, elbows, wrists, hips, knees, or left ankle.  Right ankle appears bruised and swollen.  Skin:    General: Skin is warm and dry.     Capillary Refill: Capillary refill takes less than 2 seconds.  Neurological:     General: No focal deficit present.     Mental Status: She is alert. Mental status is at baseline.     Cranial Nerves: No cranial nerve deficit.     Sensory: No sensory deficit.     Motor: No weakness.     Comments: Alert and oriented to self only.  At baseline per patient's sister.  Psychiatric:        Mood and Affect: Mood normal.     ED  Results / Procedures / Treatments   Labs (all labs ordered are listed, but only abnormal results are displayed) Labs Reviewed  CBC - Abnormal; Notable for the following components:      Result Value   WBC 10.7 (*)    RBC 3.63 (*)    Hemoglobin 11.1 (*)    HCT 34.7 (*)    RDW 15.6 (*)  Platelets 115 (*)    All other components within normal limits  BASIC METABOLIC PANEL - Abnormal; Notable for the following components:   Chloride 94 (*)    Glucose, Bld 110 (*)    BUN 48 (*)    Creatinine, Ser 3.46 (*)    GFR, Estimated 14 (*)    All other components within normal limits  CBC - Abnormal; Notable for the following components:   RBC 3.30 (*)    Hemoglobin 10.0 (*)    HCT 31.6 (*)    RDW 15.7 (*)    Platelets 110 (*)    All other components within normal limits  CREATININE, SERUM - Abnormal; Notable for the following components:   Creatinine, Ser 3.09 (*)    GFR, Estimated 16 (*)    All other components within normal limits  CULTURE, BLOOD (ROUTINE X 2)  CULTURE, BLOOD (ROUTINE X 2)  CK  URINALYSIS, ROUTINE W REFLEX MICROSCOPIC  HIV ANTIBODY (ROUTINE TESTING W REFLEX)  BASIC METABOLIC PANEL  MAGNESIUM  PHOSPHORUS  CBC WITH DIFFERENTIAL/PLATELET    EKG EKG Interpretation  Date/Time:  Friday February 15 2022 12:26:34 EST Ventricular Rate:  82 PR Interval:  144 QRS Duration: 85 QT Interval:  402 QTC Calculation: 470 R Axis:   90 Text Interpretation: Interpretation limited secondary to artifact Sinus rhythm Anteroseptal infarct, age indeterminate Confirmed by Margaretmary Eddy [id number]) on 02/15/2022 12:36:27 PM  Radiology CT Head Wo Contrast  Result Date: 02/15/2022 CLINICAL DATA:  Trauma EXAM: CT HEAD WITHOUT CONTRAST CT CERVICAL SPINE WITHOUT CONTRAST TECHNIQUE: Multidetector CT imaging of the head and cervical spine was performed following the standard protocol without intravenous contrast. Multiplanar CT image reconstructions of the cervical spine were also generated.  RADIATION DOSE REDUCTION: This exam was performed according to the departmental dose-optimization program which includes automated exposure control, adjustment of the mA and/or kV according to patient size and/or use of iterative reconstruction technique. COMPARISON:  CT C Spine 08/02/19, CT head 08/12/20 FINDINGS: CT HEAD FINDINGS Brain: No evidence of acute infarction, hemorrhage, hydrocephalus, extra-axial collection or mass lesion/mass effect. Redemonstrated is chronic infarct in the anterior right temporal lobe with mild ex vacuo dilatation of the right temporal horn. 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 Vascular: No hyperdense vessel or unexpected calcification. Skull: Normal. Negative for fracture or focal lesion. Sinuses/Orbits: No middle ear or mastoid effusion. Paranasal sinuses are clear. Orbits are unremarkable. Other: None. CT CERVICAL SPINE FINDINGS Alignment: Trace anterolisthesis of C3 on C4, C4 on C5. Skull base and vertebrae: No acute fracture. No primary bone lesion or focal pathologic process. Disc space loss at C6-C7 has slightly progressed from prior exam. Soft tissues and spinal canal: No prevertebral fluid or swelling. No visible canal hematoma. Disc levels: No CT evidence of high-grade spinal canal stenosis. There is multilevel severe degenerative facet disease resulting in moderate neural foraminal stenosis at C3-C4 on the right and severe neural foraminal stenosis at C4-C5 bilaterally. Upper chest: See separately dictated CT chest for additional findings. Severe centrilobular emphysema. Other: None IMPRESSION: CT HEAD: 1. No acute intracranial abnormality. 2. 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 CT CERVICAL SPINE: No acute fracture or traumatic malalignment of the cervical spine. Emphysema (ICD10-J43.9). Electronically Signed   By: Marin Roberts M.D.   On: 02/15/2022 14:04   CT Cervical Spine Wo  Contrast  Result Date: 02/15/2022 CLINICAL DATA:  Trauma EXAM: CT HEAD WITHOUT CONTRAST CT CERVICAL SPINE WITHOUT CONTRAST  TECHNIQUE: Multidetector CT imaging of the head and cervical spine was performed following the standard protocol without intravenous contrast. Multiplanar CT image reconstructions of the cervical spine were also generated. RADIATION DOSE REDUCTION: This exam was performed according to the departmental dose-optimization program which includes automated exposure control, adjustment of the mA and/or kV according to patient size and/or use of iterative reconstruction technique. COMPARISON:  CT C Spine 08/02/19, CT head 08/12/20 FINDINGS: CT HEAD FINDINGS Brain: No evidence of acute infarction, hemorrhage, hydrocephalus, extra-axial collection or mass lesion/mass effect. Redemonstrated is chronic infarct in the anterior right temporal lobe with mild ex vacuo dilatation of the right temporal horn. 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 Vascular: No hyperdense vessel or unexpected calcification. Skull: Normal. Negative for fracture or focal lesion. Sinuses/Orbits: No middle ear or mastoid effusion. Paranasal sinuses are clear. Orbits are unremarkable. Other: None. CT CERVICAL SPINE FINDINGS Alignment: Trace anterolisthesis of C3 on C4, C4 on C5. Skull base and vertebrae: No acute fracture. No primary bone lesion or focal pathologic process. Disc space loss at C6-C7 has slightly progressed from prior exam. Soft tissues and spinal canal: No prevertebral fluid or swelling. No visible canal hematoma. Disc levels: No CT evidence of high-grade spinal canal stenosis. There is multilevel severe degenerative facet disease resulting in moderate neural foraminal stenosis at C3-C4 on the right and severe neural foraminal stenosis at C4-C5 bilaterally. Upper chest: See separately dictated CT chest for additional findings. Severe centrilobular emphysema. Other: None  IMPRESSION: CT HEAD: 1. No acute intracranial abnormality. 2. 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 CT CERVICAL SPINE: No acute fracture or traumatic malalignment of the cervical spine. Emphysema (ICD10-J43.9). Electronically Signed   By: Marin Roberts M.D.   On: 02/15/2022 14:04   CT Chest Wo Contrast  Result Date: 02/15/2022 CLINICAL DATA:  LEFT chest wall trauma. EXAM: CT CHEST WITHOUT CONTRAST TECHNIQUE: Multidetector CT imaging of the chest was performed following the standard protocol without IV contrast. RADIATION DOSE REDUCTION: This exam was performed according to the departmental dose-optimization program which includes automated exposure control, adjustment of the mA and/or kV according to patient size and/or use of iterative reconstruction technique. COMPARISON:  None Available. FINDINGS: Cardiovascular: No thoracic aortic aneurysm. Aortic atherosclerosis. Three-vessel coronary artery calcifications, particularly dense within the LEFT anterior descending coronary artery. No clinically significant pericardial effusion. Mediastinum/Nodes: No hemorrhage or edema is seen within the mediastinum. No mass or enlarged lymph nodes are seen within the mediastinum. Esophagus is unremarkable. Trachea is unremarkable. Lungs/Pleura: Paraseptal and centrilobular emphysematous changes bilaterally, upper lobe predominant. Bibasilar consolidations, LEFT greater than RIGHT. Small bilateral pleural effusions. No pneumothorax. Upper Abdomen: Limited images of the upper abdomen are unremarkable. Musculoskeletal: Minimally displaced fractures involving the posterior and posterior-lateral segments of the LEFT eleventh rib. Additional slightly displaced fracture of the posterior-lateral LEFT tenth rib and LEFT lateral ninth rib. Chronic wedge compression deformity of the L1 vertebral body. Degenerative spondylosis of the thoracic spine, mild to moderate in degree. IMPRESSION: 1. Minimally  displaced to slightly displaced fractures of the LEFT ninth through eleventh ribs, as detailed above. 2. Bibasilar consolidations, LEFT greater than RIGHT, most likely a combination of atelectasis and pneumonia or aspiration. Favor at least some component of aspiration given the recent trauma. 3. Small bilateral pleural effusions. No pneumothorax. 4. Three-vessel coronary artery calcifications, particularly dense within the LEFT anterior descending coronary artery. Recommend correlation with any possible associated cardiac symptoms. 5. Chronic wedge compression deformity  of the L1 vertebral body. Emphysema (ICD10-J43.9). Electronically Signed   By: Franki Cabot M.D.   On: 02/15/2022 14:03   DG Ankle Complete Right  Result Date: 02/15/2022 CLINICAL DATA:  Right ankle pain, status post fall EXAM: RIGHT ANKLE - COMPLETE 3+ VIEW COMPARISON:  08/11/2020 ankle radiographs FINDINGS: Comminuted, mildly displaced and impacted fracture of the distal fibula. Comminuted, mildly displaced and distracted fracture through the medial malleolus. Soft tissue swelling about the ankle. IMPRESSION: Comminuted, mildly displaced and impacted fractures through the distal fibula and medial malleolus. Electronically Signed   By: Merilyn Baba M.D.   On: 02/15/2022 13:09    Procedures Procedures    Medications Ordered in ED Medications  lidocaine (LIDODERM) 5 % 1 patch (1 patch Transdermal Patch Applied 02/15/22 1538)  heparin injection 5,000 Units (5,000 Units Subcutaneous Given 02/15/22 1751)  lactated ringers infusion ( Intravenous New Bag/Given 02/15/22 1536)  acetaminophen (TYLENOL) tablet 650 mg (650 mg Oral Given 02/15/22 1751)    Or  acetaminophen (TYLENOL) suppository 650 mg ( Rectal See Alternative 02/15/22 1751)  oxyCODONE (Oxy IR/ROXICODONE) immediate release tablet 5 mg (has no administration in time range)  fentaNYL (SUBLIMAZE) injection 12.5 mcg (has no administration in time range)  bisacodyl (DULCOLAX) EC tablet 5  mg (has no administration in time range)  ondansetron (ZOFRAN) tablet 4 mg (has no administration in time range)    Or  ondansetron (ZOFRAN) injection 4 mg (has no administration in time range)  ipratropium-albuterol (DUONEB) 0.5-2.5 (3) MG/3ML nebulizer solution 3 mL (3 mLs Nebulization Given 02/15/22 1556)  dextromethorphan (DELSYM) 30 MG/5ML liquid 30 mg (30 mg Oral Given 02/15/22 1752)  nicotine (NICODERM CQ - dosed in mg/24 hours) patch 21 mg (21 mg Transdermal Patch Applied 02/15/22 1751)  cefTRIAXone (ROCEPHIN) 1 g in sodium chloride 0.9 % 100 mL IVPB (has no administration in time range)  azithromycin (ZITHROMAX) 500 mg in sodium chloride 0.9 % 250 mL IVPB (has no administration in time range)  hydrALAZINE (APRESOLINE) injection 10 mg (has no administration in time range)  lactated ringers bolus 1,000 mL (0 mLs Intravenous Stopped 02/15/22 1438)  lactated ringers bolus 1,000 mL (0 mLs Intravenous Stopped 02/15/22 1536)  cefTRIAXone (ROCEPHIN) 2 g in sodium chloride 0.9 % 100 mL IVPB (0 g Intravenous Stopped 02/15/22 1619)  azithromycin (ZITHROMAX) 500 mg in sodium chloride 0.9 % 250 mL IVPB (500 mg Intravenous New Bag/Given 02/15/22 1614)    ED Course/ Medical Decision Making/ A&P Clinical Course as of 02/15/22 1802  Fri Feb 15, 2022  1401 Dr Amedeo Kinsman from [facility]  [RP]  1950 Dr Wynetta Emery [RP]    Clinical Course User Index [RP] Fransico Meadow, MD                             Medical Decision Making Amount and/or Complexity of Data Reviewed Labs: ordered. Radiology: ordered.  Risk Decision regarding hospitalization.   LYNA LANINGHAM is a 66 y.o. 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    Initial Ddx:  ICH, C-spine injury, rib fracture, ankle fracture, rhabdo, pneumonia, aspiration, pneumothorax  MDM:  Initially concerned about  the above injuries given the patient's fall and physical exam.  With the patient's new onset hypoxia also concerned about the possible above diagnoses but appears to have breath sounds bilaterally so feel a tension pneumothorax  or large pneumothorax is less likely.  Will also evaluate for rhabdo and check lab work.  Sister reports that she occasionally will have urinary tract infections at present similarly so we will check urine as well.  Plan:  Labs Urinalysis CK CT head CT C-spine Chest CT X-ray right ankle  ED Summary/Re-evaluation:  Patient remained stable on 2 L nasal cannula.  Underwent the above workup which showed AKI without evidence of rhabdomyolysis.  Bladder scan without significant urine in the bladder so patient was given 2 L bolus.  Chest CT revealed multiple rib fractures and also showed possible aspiration pneumonia so was started on antibiotics and was given pain medication.  Right ankle with bimalleolar fracture that was discussed with orthopedist who will follow-up with the patient as an outpatient.  Posterior short leg splint with stirrups was applied to the patient.  She was then admitted to medicine for further management of her AKI, respiratory failure/pneumonia, and altered mental status.  This patient presents to the ED for concern of complaints listed in HPI, this involves an extensive number of treatment options, and is a complaint that carries with it a high risk of complications and morbidity. Disposition including potential need for admission considered.   Dispo: DC to Facility  Additional history obtained from family Records reviewed Outpatient Clinic Notes The following labs were independently interpreted: Chemistry and show AKI I independently reviewed the following imaging with scope of interpretation limited to determining acute life threatening conditions related to emergency care: CT Head, CT Cervical Spine, and CT Chest and agree with the radiologist  interpretation with the following exceptions: None I personally reviewed and interpreted cardiac monitoring: normal sinus rhythm  I personally reviewed and interpreted the pt's EKG: see above for interpretation  I have reviewed the patients home medications and made adjustments as needed Social Determinants of health:  Elderly, dementia  Final Clinical Impression(s) / ED Diagnoses Final diagnoses:  Closed fracture of multiple ribs of left side, initial encounter  Closed bimalleolar fracture of right ankle, initial encounter  Hypoxia  Aspiration pneumonia of both lungs, unspecified aspiration pneumonia type, unspecified part of lung ([place])  AKI (acute kidney injury) ([place])    Rx / DC Orders ED Discharge Orders     None         Fransico Meadow, MD 02/15/22 Johnnye Lana

## 2022-02-15 NOTE — ED Triage Notes (Signed)
Per [facility]. Patient found down this morning at 0800. She has noticeable swelling to her right ankle and says she hit her head. There is also bruising to her left ribs. Per family to medic patient could be heard walking around at 0430 with her walker. They also shared that patient has not been at baseline for a couple days and may have a UTI

## 2022-02-16 DIAGNOSIS — N179 Acute kidney failure, unspecified: Secondary | ICD-10-CM | POA: Diagnosis not present

## 2022-02-16 DIAGNOSIS — R296 Repeated falls: Secondary | ICD-10-CM | POA: Diagnosis not present

## 2022-02-16 DIAGNOSIS — D649 Anemia, unspecified: Secondary | ICD-10-CM

## 2022-02-16 DIAGNOSIS — J690 Pneumonitis due to inhalation of food and vomit: Secondary | ICD-10-CM

## 2022-02-16 DIAGNOSIS — D72829 Elevated white blood cell count, unspecified: Secondary | ICD-10-CM

## 2022-02-16 LAB — CBC WITH DIFFERENTIAL/PLATELET
Abs Immature Granulocytes: 0.05 10*3/uL (ref 0.00–0.07)
Basophils Absolute: 0 10*3/uL (ref 0.0–0.1)
Basophils Relative: 0 %
Eosinophils Absolute: 0.4 10*3/uL (ref 0.0–0.5)
Eosinophils Relative: 4 %
HCT: 31.8 % — ABNORMAL LOW (ref 36.0–46.0)
Hemoglobin: 10.1 g/dL — ABNORMAL LOW (ref 12.0–15.0)
Immature Granulocytes: 1 %
Lymphocytes Relative: 14 %
Lymphs Abs: 1.2 10*3/uL (ref 0.7–4.0)
MCH: 30.3 pg (ref 26.0–34.0)
MCHC: 31.8 g/dL (ref 30.0–36.0)
MCV: 95.5 fL (ref 80.0–100.0)
Monocytes Absolute: 0.7 10*3/uL (ref 0.1–1.0)
Monocytes Relative: 8 %
Neutro Abs: 6.5 10*3/uL (ref 1.7–7.7)
Neutrophils Relative %: 73 %
Platelets: 108 10*3/uL — ABNORMAL LOW (ref 150–400)
RBC: 3.33 MIL/uL — ABNORMAL LOW (ref 3.87–5.11)
RDW: 15.9 % — ABNORMAL HIGH (ref 11.5–15.5)
WBC: 8.8 10*3/uL (ref 4.0–10.5)
nRBC: 0 % (ref 0.0–0.2)

## 2022-02-16 LAB — BASIC METABOLIC PANEL
Anion gap: 10 (ref 5–15)
BUN: 44 mg/dL — ABNORMAL HIGH (ref 8–23)
CO2: 30 mmol/L (ref 22–32)
Calcium: 8.9 mg/dL (ref 8.9–10.3)
Chloride: 99 mmol/L (ref 98–111)
Creatinine, Ser: 2.27 mg/dL — ABNORMAL HIGH (ref 0.44–1.00)
GFR, Estimated: 23 mL/min — ABNORMAL LOW (ref >60)
Glucose, Bld: 96 mg/dL (ref 70–99)
Potassium: 4.5 mmol/L (ref 3.5–5.1)
Sodium: 139 mmol/L (ref 135–145)

## 2022-02-16 LAB — MAGNESIUM: Magnesium: 1.8 mg/dL (ref 1.7–2.4)

## 2022-02-16 LAB — PHOSPHORUS: Phosphorus: 3.4 mg/dL (ref 2.5–4.6)

## 2022-02-16 MED ORDER — ASPIRIN 81 MG PO TBEC
81.0000 mg | DELAYED_RELEASE_TABLET | Freq: Every day | ORAL | Status: DC
  Administered 2022-02-17 – 2022-02-24 (×8): 81 mg via ORAL
  Filled 2022-02-16 (×8): qty 1

## 2022-02-16 MED ORDER — DIVALPROEX SODIUM ER 500 MG PO TB24
1000.0000 mg | ORAL_TABLET | Freq: Every day | ORAL | Status: DC
  Administered 2022-02-16 – 2022-02-23 (×7): 1000 mg via ORAL
  Filled 2022-02-16 (×7): qty 2

## 2022-02-16 MED ORDER — BENZTROPINE MESYLATE 1 MG PO TABS
1.0000 mg | ORAL_TABLET | Freq: Two times a day (BID) | ORAL | Status: DC
  Administered 2022-02-16 – 2022-02-24 (×15): 1 mg via ORAL
  Filled 2022-02-16 (×15): qty 1

## 2022-02-16 MED ORDER — METHIMAZOLE 5 MG PO TABS
10.0000 mg | ORAL_TABLET | ORAL | Status: DC
  Administered 2022-02-16 – 2022-02-24 (×5): 10 mg via ORAL
  Filled 2022-02-16 (×7): qty 2

## 2022-02-16 MED ORDER — ENSURE ENLIVE PO LIQD
237.0000 mL | Freq: Two times a day (BID) | ORAL | Status: DC
  Administered 2022-02-17 – 2022-02-24 (×9): 237 mL via ORAL

## 2022-02-16 MED ORDER — ATORVASTATIN CALCIUM 20 MG PO TABS
20.0000 mg | ORAL_TABLET | Freq: Every day | ORAL | Status: DC
  Administered 2022-02-18 – 2022-02-23 (×6): 20 mg via ORAL
  Filled 2022-02-16 (×7): qty 1

## 2022-02-16 MED ORDER — IPRATROPIUM-ALBUTEROL 0.5-2.5 (3) MG/3ML IN SOLN
3.0000 mL | Freq: Four times a day (QID) | RESPIRATORY_TRACT | Status: DC | PRN
  Administered 2022-02-19: 3 mL via RESPIRATORY_TRACT
  Filled 2022-02-16: qty 3

## 2022-02-16 MED ORDER — PANTOPRAZOLE SODIUM 40 MG PO TBEC
40.0000 mg | DELAYED_RELEASE_TABLET | Freq: Every day | ORAL | Status: DC
  Administered 2022-02-16 – 2022-02-23 (×7): 40 mg via ORAL
  Filled 2022-02-16 (×7): qty 1

## 2022-02-16 MED ORDER — TRAZODONE HCL 50 MG PO TABS
100.0000 mg | ORAL_TABLET | Freq: Every day | ORAL | Status: DC
  Administered 2022-02-16 – 2022-02-23 (×7): 100 mg via ORAL
  Filled 2022-02-16 (×7): qty 2

## 2022-02-16 MED ORDER — VITAMIN B-12 1000 MCG PO TABS
1000.0000 ug | ORAL_TABLET | Freq: Every day | ORAL | Status: DC
  Administered 2022-02-17 – 2022-02-24 (×8): 1000 ug via ORAL
  Filled 2022-02-16 (×8): qty 1

## 2022-02-16 MED ORDER — GABAPENTIN 100 MG PO CAPS
100.0000 mg | ORAL_CAPSULE | Freq: Every day | ORAL | Status: DC
  Administered 2022-02-16 – 2022-02-23 (×7): 100 mg via ORAL
  Filled 2022-02-16 (×7): qty 1

## 2022-02-16 NOTE — Plan of Care (Signed)
  Problem: Acute Rehab PT Goals(only PT should resolve) Goal: Pt Will Go Supine/Side To Sit Flowsheets (Taken 02/16/2022 1349) Pt will go Supine/Side to Sit: with modified independence Goal: Pt Will Go Sit To Supine/Side Flowsheets (Taken 02/16/2022 1349) Pt will go Sit to Supine/Side: with modified independence Goal: Pt Will Transfer Bed To Chair/Chair To Bed Flowsheets (Taken 02/16/2022 1349) Pt will Transfer Bed to Chair/Chair to Bed: with modified independence Goal: Pt Will Ambulate Flowsheets (Taken 02/16/2022 1349) Pt will Ambulate:  10 feet  with moderate assist  with rolling walker

## 2022-02-16 NOTE — Evaluation (Signed)
Physical Therapy Evaluation Patient Details Name: Cassidy Smith MRN: [id number] DOB: [birth year] Today's Date: 02/16/2022  History of Present Illness  patient has been having frequent falls recently at home and gait instability which is worsened over the past 3 to 4 days.  She also is having intermittent confusion which is worse from her baseline.  She has had poor oral intake over the past several days according to caretakers.  Patient denies having fever and chills.  Patient denies chest pain and reports having cough and chest congestion.     Patient was found down this morning by her care aides.  She apparently had fallen sometime in the middle of the night.  She had noticeable bruising and swelling to her right ankle and she hit her head.  She has bruising on her left rib cage.  Patient normally ambulates with a walker.  Family reports that patient has not been at her baseline mentation for the past several days.     Workup in the emergency department reveals that patient has severe acute kidney injury, she has a fractured right ankle and multiple rib fractures.  She also has evidence of an aspiration pneumonitis and she was started on IV fluids and IV antibiotics.  Admission was requested for further management.  Clinical Impression  Pt has difficulty maintaining NWB status despite cuing from therapist.  This and pain level limited therapy session.       Recommendations for follow up therapy are one component of a multi-disciplinary discharge planning process, led by the attending physician.  Recommendations may be updated based on patient status, additional functional criteria and insurance authorization.  Follow Up Recommendations Skilled nursing-short term rehab (<3 hours/day) Can patient physically be transported by private vehicle: No    Assistance Recommended at Discharge Frequent or constant Supervision/Assistance  Patient can return home with the following  A lot of help with walking  and/or transfers;A lot of help with bathing/dressing/bathroom;Assistance with cooking/housework    Equipment Recommendations None recommended by PT  Recommendations for Other Services    OT   Functional Status Assessment Patient has had a recent decline in their functional status and demonstrates the ability to make significant improvements in function in a reasonable and predictable amount of time.     Precautions / Restrictions Precautions Precautions: Fall Restrictions Weight Bearing Restrictions: Yes RLE Weight Bearing: Non weight bearing      Mobility  Bed Mobility Overal bed mobility: Needs Assistance               Patient Response: Cooperative  Transfers Overall transfer level: Needs assistance Equipment used: Rolling walker (2 wheels) Transfers: Sit to/from Stand, Bed to chair/wheelchair/BSC Sit to Stand: Min assist Stand pivot transfers: Min assist              Ambulation/Gait Ambulation/Gait assistance: Mod assist Gait Distance (Feet): 3 Feet Assistive device: Rolling walker (2 wheels) Gait Pattern/deviations: Step-to pattern, Decreased step length - left, Decreased step length - right   Gait velocity interpretation: <1.31 ft/sec, indicative of household ambulator            Pertinent Vitals/Pain Pain Assessment Pain Assessment: 0-10 Pain Score: 10-Worst pain ever Pain Location: states that she just had a pill for pain Pain Descriptors / Indicators: Aching Pain Intervention(s): Limited activity within patient's tolerance, Premedicated before session    Home Living Family/patient expects to be discharged to:: Private residence Living Arrangements: Children Available Help at Discharge: Family;Personal care attendant (States that she lives in  her son's house but he stays in [place]) Type of Home: House Home Access: Stairs to enter Entrance Stairs-Rails: Right Entrance Stairs-Number of Steps: 3   Home Layout: Able to live on main level  with bedroom/bathroom Home Equipment: [place] (2 wheels);Grab bars - tub/shower;Shower seat Additional Comments: Poor historian nobody is in the room to verify information    Prior Function Prior Level of Function : Needs assist       Physical Assist : Mobility (physical)     Mobility Comments: Pt states that she was not allowed out of the bed or the chair. ADLs Comments: housework           Communication   Communication: No difficulties  Cognition Arousal/Alertness: Awake/alert Behavior During Therapy: WFL for tasks assessed/performed Overall Cognitive Status: Within Functional Limits for tasks assessed                                                 Assessment/Plan    PT Assessment Patient needs continued PT services  PT Problem List Decreased strength;Decreased activity tolerance;Decreased balance;Decreased mobility;Pain       PT Treatment Interventions Gait training;Functional mobility training;Therapeutic exercise;Therapeutic activities    PT Goals (Current goals can be found in the Care Plan section)       Frequency Min 4X/week        AM-PAC PT "6 Clicks" Mobility  Outcome Measure Help needed turning from your back to your side while in a flat bed without using bedrails?: None Help needed moving from lying on your back to sitting on the side of a flat bed without using bedrails?: A Little Help needed moving to and from a bed to a chair (including a wheelchair)?: A Little Help needed standing up from a chair using your arms (e.g., wheelchair or bedside chair)?: A Little Help needed to walk in hospital room?: Total Help needed climbing 3-5 steps with a railing? : Total 6 Click Score: 15    End of Session Equipment Utilized During Treatment: Gait belt Activity Tolerance: Patient limited by pain Patient left: in bed;with call bell/phone within reach;with bed alarm set Nurse Communication: Mobility status PT Visit Diagnosis:  Unsteadiness on feet (R26.81);Repeated falls (R29.6);Muscle weakness (generalized) (M62.81);History of falling (Z91.81)    Time: 1320-1350 PT Time Calculation (min) (ACUTE ONLY): 30 min   Charges:   PT Evaluation $PT Eval Moderate Complexity: 1 Mod PT Treatments $Gait Training: 8-22 mins   Rayetta Humphrey, PT CLT     02/16/2022, 1:50 PM

## 2022-02-16 NOTE — Progress Notes (Signed)
Patient voided during the night in bed, patient had pulled out purewick. Patients bed was soaking wet, linen changed, bath given, and purewick replaced. Patient rested some through the night.

## 2022-02-16 NOTE — Progress Notes (Signed)
PROGRESS NOTE   Cassidy Smith  WTU:[id number] DOB: [birth year] DOA: 02/15/2022 PCP: Neale Burly, MD   Chief Complaint  Patient presents with   Fall    Patient found on ground at 0800   Level of care: Med-Surg  Brief Admission History:  67 year old female with history of depression, anxiety, bipolar 1 disorder, chronic DVT, chronic hip pain, dementia, history of UTIs and hypothyroidism, previous intracerebral hematoma, history of hemorrhagic CVA, SIADH, DNR present on admission, history of rhabdomyolysis who apparently has been living alone but having care aides that are with her during the daytime however she remains alone at night.  Historian reported that patient has been having frequent falls recently at home and gait instability which is worsened over the past 3 to 4 days.  She also is having intermittent confusion which is worse from her baseline.  She has had poor oral intake over the past several days according to caretakers.  Patient denies having fever and chills.  Patient denies chest pain and reports having cough and chest congestion.  Patient was found down this morning by her care aides.  She apparently had fallen sometime in the middle of the night.  She had noticeable bruising and swelling to her right ankle and she hit her head.  She has bruising on her left rib cage.  Patient normally ambulates with a walker.  Family reports that patient has not been at her baseline mentation for the past several days.  Workup in the emergency department reveals that patient has severe acute kidney injury, she has a fractured right ankle and multiple rib fractures.  She also has evidence of an aspiration pneumonitis and she was started on IV fluids and IV antibiotics.  Admission was requested for further management.    Assessment and Plan:  Acute renal failure Prerenal secondary to severe dehydration Treating with IV fluid Renally dose medications Follow daily basic metabolic  panel Bladder scan less than 50 cc noted Creatinine improved to 2.27    Aspiration pneumonia Continue antibiotics as ordered Dysphagia diet ordered with aspiration precautions Supplemental oxygen as needed Bronchodilators and cough suppressants as ordered SLP eval requested    Leukocytosis - resolved  Secondary to pneumonia and aspiration Resolved    Right ankle fracture Discussed with orthopedist Dr. Amedeo Kinsman who recommends splinting and outpatient follow-up with orthopedics Pain management as ordered NWB to RLE until ok with orthopedics   Falls Frequently/High Fall Risk Fall precautions recommended PT evaluation requested and recommending SNF TOC consulted for SNF placement    Bipolar 1 disorder Resumed home behavioral health medications Delirium precautions recommended   Tobacco user Nicotine patch ordered Nurse to counsel on cessation ordered   Hyperthyroidism Resumed home methimazole    DNR present on admission Advance care planning documents have been scanned into the EMR and reviewed   Essential hypertension Diet controlled, following    DVT prophylaxis: [place] heparin  Code Status: DNR  Family Communication: sister  Disposition: Status is: Inpatient Remains inpatient appropriate because: IV fluids required    Consultants:  orthopeds  Procedures:   Antimicrobials:    Subjective: Pt reports pain is controlled.   Objective: Vitals:   02/16/22 0630 02/16/22 0727 02/16/22 1317 02/16/22 1437  BP:    118/78  Pulse:    89  Resp:    18  Temp:      TempSrc:      SpO2:  97% 94% (!) 89%  Weight: 59.6 kg     Height:  Intake/Output Summary (Last 24 hours) at 02/16/2022 1749 Last data filed at 02/16/2022 1716 Gross per 24 hour  Intake 2291.41 ml  Output --  Net 2291.41 ml   Filed Weights   02/15/22 1231 02/15/22 1735 02/16/22 0630  Weight: 49.9 kg 58 kg 59.6 kg   Examination:  General exam: Appears calm and comfortable MM dry.  Respiratory  system: Clear to auscultation. Respiratory effort normal. Cardiovascular system: normal S1 & S2 heard. No JVD, murmurs, rubs, gallops or clicks. No pedal edema. Gastrointestinal system: Abdomen is nondistended, soft and nontender. No organomegaly or masses felt. Normal bowel sounds heard. Central nervous system: Alert and oriented. No focal neurological deficits. Extremities: RLE in cast/brace. Skin: No rashes, lesions or ulcers. Psychiatry: Judgement and insight appear normal. Mood & affect appropriate.   Data Reviewed: I have personally reviewed following labs and imaging studies  CBC: Recent Labs  Lab 02/15/22 1244 02/15/22 1532 02/16/22 0446  WBC 10.7* 9.6 8.8  NEUTROABS  --   --  6.5  HGB 11.1* 10.0* 10.1*  HCT 34.7* 31.6* 31.8*  MCV 95.6 95.8 95.5  PLT 115* 110* 108*    Basic Metabolic Panel: Recent Labs  Lab 02/15/22 1244 02/15/22 1532 02/16/22 0446  NA 137  --  139  K 4.6  --  4.5  CL 94*  --  99  CO2 32  --  30  GLUCOSE 110*  --  96  BUN 48*  --  44*  CREATININE 3.46* 3.09* 2.27*  CALCIUM 9.2  --  8.9  MG  --   --  1.8  PHOS  --   --  3.4    CBG: No results for input(s): "GLUCAP" in the last 168 hours.  Recent Results (from the past 240 hour(s))  Blood culture (routine x 2)     Status: None (Preliminary result)   Collection Time: 02/15/22  3:32 PM   Specimen: BLOOD  Result Value Ref Range Status   Specimen Description BLOOD BLOOD RIGHT ARM  Final   Special Requests Blood Culture adequate volume  Final   Culture   Final    NO GROWTH < 24 HOURS Performed at [facility], [address] [place], [place] [address]    Report Status PENDING  Incomplete  Blood culture (routine x 2)     Status: None (Preliminary result)   Collection Time: 02/15/22  3:32 PM   Specimen: BLOOD  Result Value Ref Range Status   Specimen Description BLOOD BLOOD RIGHT HAND AEROBIC BOTTLE ONLY  Final   Special Requests Blood Culture adequate volume  Final   Culture   Final     NO GROWTH < 24 HOURS Performed at [facility], [address] [place], [place] [address]    Report Status PENDING  Incomplete     Radiology Studies: CT Head Wo Contrast  Result Date: 02/15/2022 CLINICAL DATA:  Trauma EXAM: CT HEAD WITHOUT CONTRAST CT CERVICAL SPINE WITHOUT CONTRAST TECHNIQUE: Multidetector CT imaging of the head and cervical spine was performed following the standard protocol without intravenous contrast. Multiplanar CT image reconstructions of the cervical spine were also generated. RADIATION DOSE REDUCTION: This exam was performed according to the departmental dose-optimization program which includes automated exposure control, adjustment of the mA and/or kV according to patient size and/or use of iterative reconstruction technique. COMPARISON:  CT C Spine 08/02/19, CT head 08/12/20 FINDINGS: CT HEAD FINDINGS Brain: No evidence of acute infarction, hemorrhage, hydrocephalus, extra-axial collection or mass lesion/mass effect. Redemonstrated is chronic  infarct in the anterior right temporal lobe with mild ex vacuo dilatation of the right temporal horn. 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 Vascular: No hyperdense vessel or unexpected calcification. Skull: Normal. Negative for fracture or focal lesion. Sinuses/Orbits: No middle ear or mastoid effusion. Paranasal sinuses are clear. Orbits are unremarkable. Other: None. CT CERVICAL SPINE FINDINGS Alignment: Trace anterolisthesis of C3 on C4, C4 on C5. Skull base and vertebrae: No acute fracture. No primary bone lesion or focal pathologic process. Disc space loss at C6-C7 has slightly progressed from prior exam. Soft tissues and spinal canal: No prevertebral fluid or swelling. No visible canal hematoma. Disc levels: No CT evidence of high-grade spinal canal stenosis. There is multilevel severe degenerative facet disease resulting in moderate neural foraminal stenosis at C3-C4 on the right and severe  neural foraminal stenosis at C4-C5 bilaterally. Upper chest: See separately dictated CT chest for additional findings. Severe centrilobular emphysema. Other: None IMPRESSION: CT HEAD: 1. No acute intracranial abnormality. 2. 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 CT CERVICAL SPINE: No acute fracture or traumatic malalignment of the cervical spine. Emphysema (ICD10-J43.9). Electronically Signed   By: Marin Roberts M.D.   On: 02/15/2022 14:04   CT Cervical Spine Wo Contrast  Result Date: 02/15/2022 CLINICAL DATA:  Trauma EXAM: CT HEAD WITHOUT CONTRAST CT CERVICAL SPINE WITHOUT CONTRAST TECHNIQUE: Multidetector CT imaging of the head and cervical spine was performed following the standard protocol without intravenous contrast. Multiplanar CT image reconstructions of the cervical spine were also generated. RADIATION DOSE REDUCTION: This exam was performed according to the departmental dose-optimization program which includes automated exposure control, adjustment of the mA and/or kV according to patient size and/or use of iterative reconstruction technique. COMPARISON:  CT C Spine 08/02/19, CT head 08/12/20 FINDINGS: CT HEAD FINDINGS Brain: No evidence of acute infarction, hemorrhage, hydrocephalus, extra-axial collection or mass lesion/mass effect. Redemonstrated is chronic infarct in the anterior right temporal lobe with mild ex vacuo dilatation of the right temporal horn. 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 Vascular: No hyperdense vessel or unexpected calcification. Skull: Normal. Negative for fracture or focal lesion. Sinuses/Orbits: No middle ear or mastoid effusion. Paranasal sinuses are clear. Orbits are unremarkable. Other: None. CT CERVICAL SPINE FINDINGS Alignment: Trace anterolisthesis of C3 on C4, C4 on C5. Skull base and vertebrae: No acute fracture. No primary bone lesion or focal pathologic process. Disc space loss at C6-C7  has slightly progressed from prior exam. Soft tissues and spinal canal: No prevertebral fluid or swelling. No visible canal hematoma. Disc levels: No CT evidence of high-grade spinal canal stenosis. There is multilevel severe degenerative facet disease resulting in moderate neural foraminal stenosis at C3-C4 on the right and severe neural foraminal stenosis at C4-C5 bilaterally. Upper chest: See separately dictated CT chest for additional findings. Severe centrilobular emphysema. Other: None IMPRESSION: CT HEAD: 1. No acute intracranial abnormality. 2. 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 CT CERVICAL SPINE: No acute fracture or traumatic malalignment of the cervical spine. Emphysema (ICD10-J43.9). Electronically Signed   By: Marin Roberts M.D.   On: 02/15/2022 14:04   CT Chest Wo Contrast  Result Date: 02/15/2022 CLINICAL DATA:  LEFT chest wall trauma. EXAM: CT CHEST WITHOUT CONTRAST TECHNIQUE: Multidetector CT imaging of the chest was performed following the standard protocol without IV contrast. RADIATION DOSE REDUCTION: This exam was performed according to the departmental dose-optimization program which includes  automated exposure control, adjustment of the mA and/or kV according to patient size and/or use of iterative reconstruction technique. COMPARISON:  None Available. FINDINGS: Cardiovascular: No thoracic aortic aneurysm. Aortic atherosclerosis. Three-vessel coronary artery calcifications, particularly dense within the LEFT anterior descending coronary artery. No clinically significant pericardial effusion. Mediastinum/Nodes: No hemorrhage or edema is seen within the mediastinum. No mass or enlarged lymph nodes are seen within the mediastinum. Esophagus is unremarkable. Trachea is unremarkable. Lungs/Pleura: Paraseptal and centrilobular emphysematous changes bilaterally, upper lobe predominant. Bibasilar consolidations, LEFT greater than RIGHT. Small bilateral  pleural effusions. No pneumothorax. Upper Abdomen: Limited images of the upper abdomen are unremarkable. Musculoskeletal: Minimally displaced fractures involving the posterior and posterior-lateral segments of the LEFT eleventh rib. Additional slightly displaced fracture of the posterior-lateral LEFT tenth rib and LEFT lateral ninth rib. Chronic wedge compression deformity of the L1 vertebral body. Degenerative spondylosis of the thoracic spine, mild to moderate in degree. IMPRESSION: 1. Minimally displaced to slightly displaced fractures of the LEFT ninth through eleventh ribs, as detailed above. 2. Bibasilar consolidations, LEFT greater than RIGHT, most likely a combination of atelectasis and pneumonia or aspiration. Favor at least some component of aspiration given the recent trauma. 3. Small bilateral pleural effusions. No pneumothorax. 4. Three-vessel coronary artery calcifications, particularly dense within the LEFT anterior descending coronary artery. Recommend correlation with any possible associated cardiac symptoms. 5. Chronic wedge compression deformity of the L1 vertebral body. Emphysema (ICD10-J43.9). Electronically Signed   By: Franki Cabot M.D.   On: 02/15/2022 14:03   DG Ankle Complete Right  Result Date: 02/15/2022 CLINICAL DATA:  Right ankle pain, status post fall EXAM: RIGHT ANKLE - COMPLETE 3+ VIEW COMPARISON:  08/11/2020 ankle radiographs FINDINGS: Comminuted, mildly displaced and impacted fracture of the distal fibula. Comminuted, mildly displaced and distracted fracture through the medial malleolus. Soft tissue swelling about the ankle. IMPRESSION: Comminuted, mildly displaced and impacted fractures through the distal fibula and medial malleolus. Electronically Signed   By: Merilyn Baba M.D.   On: 02/15/2022 13:09    Scheduled Meds:  acetaminophen  650 mg Oral Q6H   Or   acetaminophen  650 mg Rectal Q6H   dextromethorphan  30 mg Oral BID   heparin  5,000 Units Subcutaneous Q8H    ipratropium-albuterol  3 mL Nebulization TID   lidocaine  1 patch Transdermal Q24H   nicotine  21 mg Transdermal Daily   Continuous Infusions:  azithromycin 250 mL/hr at 02/16/22 1716   cefTRIAXone (ROCEPHIN)  IV Stopped (02/16/22 1230)   lactated ringers Stopped (02/16/22 1633)     LOS: 1 day   Time spent: 63 mins  Asaiah Scarber Wynetta Emery, MD How to contact the [facility] Attending or Consulting provider [place] or covering provider during after hours [place], for this patient?  Check the care team in [facility] and look for a) attending/consulting [facility] provider listed and b) the [facility] team listed Log into [email or web address] and use [place]'s universal password to access. If you do not have the password, please contact the hospital operator. Locate the [facility] provider you are looking for under [facility] and page to a number that you can be directly reached. If you still have difficulty reaching the provider, please page the [facility] (Director on Call) for the Hospitalists listed on amion for assistance.  02/16/2022, 5:49 PM

## 2022-02-16 NOTE — Progress Notes (Signed)
PT Cancellation Note  Patient Details Name: Cassidy Smith MRN: [id number] DOB: 11/09/1955   Cancelled Treatment:    PT has  Narrative & Impression  CLINICAL DATA:  Right ankle pain, status post fall   EXAM: RIGHT ANKLE - COMPLETE 3+ VIEW   COMPARISON:  08/11/2020 ankle radiographs   FINDINGS: Comminuted, mildly displaced and impacted fracture of the distal fibula. Comminuted, mildly displaced and distracted fracture through the medial malleolus.  No orthopedic note.  Therapist awaiting WB status per referring MD.  Pt will not be able to comprehend NWB in therapist opinion.    Rayetta Humphrey, PT CLT [phone]  02/16/2022, 11:44 AM

## 2022-02-17 DIAGNOSIS — D649 Anemia, unspecified: Secondary | ICD-10-CM | POA: Diagnosis not present

## 2022-02-17 DIAGNOSIS — N179 Acute kidney failure, unspecified: Secondary | ICD-10-CM | POA: Diagnosis not present

## 2022-02-17 DIAGNOSIS — E222 Syndrome of inappropriate secretion of antidiuretic hormone: Secondary | ICD-10-CM

## 2022-02-17 DIAGNOSIS — S82841A Displaced bimalleolar fracture of right lower leg, initial encounter for closed fracture: Secondary | ICD-10-CM

## 2022-02-17 DIAGNOSIS — E0590 Thyrotoxicosis, unspecified without thyrotoxic crisis or storm: Secondary | ICD-10-CM

## 2022-02-17 DIAGNOSIS — Z8659 Personal history of other mental and behavioral disorders: Secondary | ICD-10-CM | POA: Diagnosis not present

## 2022-02-17 DIAGNOSIS — R296 Repeated falls: Secondary | ICD-10-CM | POA: Diagnosis not present

## 2022-02-17 DIAGNOSIS — F0390 Unspecified dementia without behavioral disturbance: Secondary | ICD-10-CM

## 2022-02-17 LAB — CBC WITH DIFFERENTIAL/PLATELET
Abs Immature Granulocytes: 0.06 10*3/uL (ref 0.00–0.07)
Basophils Absolute: 0 10*3/uL (ref 0.0–0.1)
Basophils Relative: 1 %
Eosinophils Absolute: 0.9 10*3/uL — ABNORMAL HIGH (ref 0.0–0.5)
Eosinophils Relative: 13 %
HCT: 28.8 % — ABNORMAL LOW (ref 36.0–46.0)
Hemoglobin: 9.2 g/dL — ABNORMAL LOW (ref 12.0–15.0)
Immature Granulocytes: 1 %
Lymphocytes Relative: 27 %
Lymphs Abs: 2 10*3/uL (ref 0.7–4.0)
MCH: 30.3 pg (ref 26.0–34.0)
MCHC: 31.9 g/dL (ref 30.0–36.0)
MCV: 94.7 fL (ref 80.0–100.0)
Monocytes Absolute: 0.7 10*3/uL (ref 0.1–1.0)
Monocytes Relative: 9 %
Neutro Abs: 3.7 10*3/uL (ref 1.7–7.7)
Neutrophils Relative %: 49 %
Platelets: 90 10*3/uL — ABNORMAL LOW (ref 150–400)
RBC: 3.04 MIL/uL — ABNORMAL LOW (ref 3.87–5.11)
RDW: 16.4 % — ABNORMAL HIGH (ref 11.5–15.5)
WBC: 7.5 10*3/uL (ref 4.0–10.5)
nRBC: 0.3 % — ABNORMAL HIGH (ref 0.0–0.2)

## 2022-02-17 LAB — BASIC METABOLIC PANEL
Anion gap: 7 (ref 5–15)
BUN: 36 mg/dL — ABNORMAL HIGH (ref 8–23)
CO2: 31 mmol/L (ref 22–32)
Calcium: 8.8 mg/dL — ABNORMAL LOW (ref 8.9–10.3)
Chloride: 101 mmol/L (ref 98–111)
Creatinine, Ser: 1.28 mg/dL — ABNORMAL HIGH (ref 0.44–1.00)
GFR, Estimated: 46 mL/min — ABNORMAL LOW (ref >60)
Glucose, Bld: 93 mg/dL (ref 70–99)
Potassium: 4 mmol/L (ref 3.5–5.1)
Sodium: 139 mmol/L (ref 135–145)

## 2022-02-17 LAB — MAGNESIUM: Magnesium: 1.7 mg/dL (ref 1.7–2.4)

## 2022-02-17 NOTE — Progress Notes (Signed)
Patient was found without oxygen on. O2 Saturation was 51%. Placed nasal canula back on patient which brought her oxygen up to 92% on 4L. MD Wynetta Emery notified.

## 2022-02-17 NOTE — Consult Note (Signed)
ORTHOPAEDIC CONSULTATION  REQUESTING PHYSICIAN: Murlean Iba, MD  ASSESSMENT AND PLAN: 67 y.o. female with the following: Right bimalleolar ankle fracture  Patient's ankle injury can be managed as an outpatient.  Given her current medical condition, including acute renal failure and aspiration pneumonia, she is not a good candidate for surgery.  In addition, given her level of cognition, and overall medical health, I anticipate that we will treat this injury without surgery.  Regardless, it would be difficult for her to follow appropriate guidelines after surgery.  Her current splint looks very good.  As to fracture is healed, her pain will continue to improve.  After period of immobilization, I do anticipate that she will be able to start bearing weight.  We will see her in clinic in approximately 2 weeks for repeat evaluation.  This will include an x-ray, and likely transition to a cast.  No family is available this morning during my evaluation.  - Weight Bearing Status/Activity: Nonweightbearing.  Keep the splint clean, dry and intact.  - Additional recommended labs/tests: None  -VTE Prophylaxis: At the discretion of the primary team  - Pain control: As needed.  Avoid narcotics if possible  - Follow-up plan: Approximately 2 weeks in clinic  -Procedures: None  Chief Complaint: Ankle pain  HPI: Cassidy Smith is a 67 y.o. female with past medical history as listed below.  Patient has dementia at baseline.  No family is available.  This information has been gleaned from the chart.  She lives at home, but has vascular dementia.  She was found down early Friday morning.  She had some swelling of the right ankle, and radiographs demonstrate a bimalleolar ankle fracture.  She was placed in a splint.  She has been admitted for medical reasons.  Past Medical History:  Diagnosis Date   Anxiety    Bipolar 1 disorder ([place])    Chronic deep vein thrombosis (DVT) of distal vein of lower  extremity ([place]) 07/10/2017   Chronic hip pain    Depression    GI bleed    Headache(784.0)    History of UTI/bladder spams.    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   Thyrotoxicosis, unspecified with thyrotoxic crisis or storm 01/15/2019   Past Surgical History:  Procedure Laterality Date   COLONOSCOPY WITH PROPOFOL N/A 04/05/2017   Procedure: COLONOSCOPY WITH PROPOFOL;  Surgeon: Rogene Houston, MD;  [facility]: [facility];  Service: Endoscopy;  Laterality: N/A;   INTRAMEDULLARY (IM) NAIL INTERTROCHANTERIC Right 03/07/2017   Procedure: OPEN TREATMENT INTERNAL FIXATION RIGHT HIP WITH GAMA INTRAMEDULARY NAIL;  Surgeon: Carole Civil, MD;  Location: [facility];  Service: Orthopedics;  Laterality: Right;   MULTIPLE EXTRACTIONS WITH ALVEOLOPLASTY N/A 10/30/2012   Procedure: MULTIPLE EXTRACION 5, 6, 8, 9, 10 ,18, 19, 31 WITH MAXILLARY RIGHT AND LEFT  ALVEOLOPLASTY REDUCE MAXILLARY LEFT TUBEROSITY;  Surgeon: Gae Bon, DDS;  Location: [facility];  Service: Oral Surgery;  Laterality: N/A;   POLYPECTOMY  04/05/2017   Procedure: POLYPECTOMY;  Surgeon: Rogene Houston, MD;  Location: [facility];  Service: Endoscopy;;  recto-sigmoid, rectum   Social History   Socioeconomic History  Marital status: Divorced    Spouse name: Not on file   Number of children: Not on file   Years of education: Not on file   Highest education level: Not on file  Occupational History   Not on file  Tobacco Use   Smoking status: Former    Packs/day: 0.50    Years: 20.00    Total pack years: 10.00    Types: Cigarettes    Quit date: 05/05/2017    Years since quitting: 4.7    Smokeless tobacco: Never   Tobacco comments:    patient states she quit one week ago  Vaping Use   Vaping Use: Never used  Substance and Sexual Activity   Alcohol use: No   Drug use: No   Sexual activity: Not on file  Other Topics Concern   Not on file  Social History Narrative   Not on file   Social Determinants of Health   Financial Resource Strain: Not on file  Food Insecurity: No Food Insecurity (02/15/2022)   Hunger Vital Sign    Worried About Running Out of Food in the Last Year: Never true    Ran Out of Food in the Last Year: Never true  Transportation Needs: No Transportation Needs (02/15/2022)   PRAPARE - Hydrologist (Medical): No    Lack of Transportation (Non-Medical): No  Physical Activity: Not on file  Stress: Not on file  Social Connections: Not on file   Family History  Problem Relation Age of Onset   Breast cancer Mother    Cancer - Other Mother    Alcoholism Father    Colon cancer Neg Hx    Colon polyps Neg Hx    No Known Allergies Prior to Admission medications   Medication Sig Start Date End Date Taking? Authorizing Provider  acetaminophen (TYLENOL) 325 MG tablet Take 2 tablets (650 mg total) by mouth every 6 (six) hours as needed for mild pain (or Fever >/= 101). 08/14/20  Yes Roxan Hockey, MD  aspirin EC 81 MG tablet Take 1 tablet (81 mg total) by mouth daily with breakfast. Swallow whole. 08/14/20  Yes Emokpae, Courage, MD  atorvastatin (LIPITOR) 20 MG tablet Take 1 tablet (20 mg total) by mouth daily at 6 PM. 06/11/17  Yes Barton Dubois, MD  benztropine (COGENTIN) 1 MG tablet Take 1 tablet (1 mg total) by mouth 2 (two) times daily. 01/05/18  Yes Johnson, Clanford L, MD  cefUROXime (CEFTIN) 250 MG tablet Take 250 mg by mouth 2 (two) times daily with a meal. 02/14/22  Yes [provider]  Cholecalciferol (VITAMIN D-3) 125 MCG (5000 UT) TABS Take 5,000 Units by mouth daily.   Yes [provider]  divalproex  (DEPAKOTE ER) 500 MG 24 hr tablet Take 2 tablets (1,000 mg total) by mouth at bedtime. 08/16/17  Yes Tat, Shanon Brow, MD  furosemide (LASIX) 20 MG tablet Take 10 mg by mouth daily. 07/06/20  Yes [provider]  gabapentin (NEURONTIN) 100 MG capsule Take 1 capsule (100 mg total) by mouth 3 (three) times daily. Patient taking differently: Take 100-200 mg by mouth in the morning and at bedtime. 1 capsule in the morning and 2 capsules in the evening. 05/13/17  Yes Carole Civil, MD  methimazole (TAPAZOLE) 10 MG tablet Take 10 mg by mouth every other day. 07/06/20  Yes [provider]  Multiple Vitamin (MULTIVITAMIN WITH MINERALS) TABS tablet Take 1 tablet by mouth daily. Centrum Silver  Yes [provider]  pantoprazole (PROTONIX) 40 MG tablet Take 40 mg by mouth daily.   Yes [provider]  potassium chloride (KLOR-CON) 10 MEQ tablet Take 10 mEq by mouth daily. 01/16/22  Yes [provider]  traZODone (DESYREL) 100 MG tablet Take 100 mg by mouth at bedtime.   Yes [provider]  vitamin B-12 (CYANOCOBALAMIN) 1000 MCG tablet Take 1 tablet (1,000 mcg total) by mouth daily. 06/11/17  Yes Barton Dubois, MD  feeding supplement (ENSURE ENLIVE / ENSURE PLUS) LIQD Take 237 mLs by mouth 2 (two) times daily between meals. 08/15/20   Roxan Hockey, MD   CT Head Wo Contrast  Result Date: 02/15/2022 CLINICAL DATA:  Trauma EXAM: CT HEAD WITHOUT CONTRAST CT CERVICAL SPINE WITHOUT CONTRAST TECHNIQUE: Multidetector CT imaging of the head and cervical spine was performed following the standard protocol without intravenous contrast. Multiplanar CT image reconstructions of the cervical spine were also generated. RADIATION DOSE REDUCTION: This exam was performed according to the departmental dose-optimization program which includes automated exposure control, adjustment of the mA and/or kV according to patient size and/or use of iterative reconstruction technique.  COMPARISON:  CT C Spine 08/02/19, CT head 08/12/20 FINDINGS: CT HEAD FINDINGS Brain: No evidence of acute infarction, hemorrhage, hydrocephalus, extra-axial collection or mass lesion/mass effect. Redemonstrated is chronic infarct in the anterior right temporal lobe with mild ex vacuo dilatation of the right temporal horn. 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 Vascular: No hyperdense vessel or unexpected calcification. Skull: Normal. Negative for fracture or focal lesion. Sinuses/Orbits: No middle ear or mastoid effusion. Paranasal sinuses are clear. Orbits are unremarkable. Other: None. CT CERVICAL SPINE FINDINGS Alignment: Trace anterolisthesis of C3 on C4, C4 on C5. Skull base and vertebrae: No acute fracture. No primary bone lesion or focal pathologic process. Disc space loss at C6-C7 has slightly progressed from prior exam. Soft tissues and spinal canal: No prevertebral fluid or swelling. No visible canal hematoma. Disc levels: No CT evidence of high-grade spinal canal stenosis. There is multilevel severe degenerative facet disease resulting in moderate neural foraminal stenosis at C3-C4 on the right and severe neural foraminal stenosis at C4-C5 bilaterally. Upper chest: See separately dictated CT chest for additional findings. Severe centrilobular emphysema. Other: None IMPRESSION: CT HEAD: 1. No acute intracranial abnormality. 2. 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 CT CERVICAL SPINE: No acute fracture or traumatic malalignment of the cervical spine. Emphysema (ICD10-J43.9). Electronically Signed   By: Marin Roberts M.D.   On: 02/15/2022 14:04   CT Cervical Spine Wo Contrast  Result Date: 02/15/2022 CLINICAL DATA:  Trauma EXAM: CT HEAD WITHOUT CONTRAST CT CERVICAL SPINE WITHOUT CONTRAST TECHNIQUE: Multidetector CT imaging of the head and cervical spine was performed following the standard protocol without intravenous contrast.  Multiplanar CT image reconstructions of the cervical spine were also generated. RADIATION DOSE REDUCTION: This exam was performed according to the departmental dose-optimization program which includes automated exposure control, adjustment of the mA and/or kV according to patient size and/or use of iterative reconstruction technique. COMPARISON:  CT C Spine 08/02/19, CT head 08/12/20 FINDINGS: CT HEAD FINDINGS Brain: No evidence of acute infarction, hemorrhage, hydrocephalus, extra-axial collection or mass lesion/mass effect. Redemonstrated is chronic infarct in the anterior right temporal lobe with mild ex vacuo dilatation of the right temporal horn. 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 Vascular: No hyperdense vessel or unexpected calcification. Skull: Normal. Negative for fracture  or focal lesion. Sinuses/Orbits: No middle ear or mastoid effusion. Paranasal sinuses are clear. Orbits are unremarkable. Other: None. CT CERVICAL SPINE FINDINGS Alignment: Trace anterolisthesis of C3 on C4, C4 on C5. Skull base and vertebrae: No acute fracture. No primary bone lesion or focal pathologic process. Disc space loss at C6-C7 has slightly progressed from prior exam. Soft tissues and spinal canal: No prevertebral fluid or swelling. No visible canal hematoma. Disc levels: No CT evidence of high-grade spinal canal stenosis. There is multilevel severe degenerative facet disease resulting in moderate neural foraminal stenosis at C3-C4 on the right and severe neural foraminal stenosis at C4-C5 bilaterally. Upper chest: See separately dictated CT chest for additional findings. Severe centrilobular emphysema. Other: None IMPRESSION: CT HEAD: 1. No acute intracranial abnormality. 2. 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 CT CERVICAL SPINE: No acute fracture or traumatic malalignment of the cervical spine. Emphysema (ICD10-J43.9). Electronically Signed   By:  Marin Roberts M.D.   On: 02/15/2022 14:04   CT Chest Wo Contrast  Result Date: 02/15/2022 CLINICAL DATA:  LEFT chest wall trauma. EXAM: CT CHEST WITHOUT CONTRAST TECHNIQUE: Multidetector CT imaging of the chest was performed following the standard protocol without IV contrast. RADIATION DOSE REDUCTION: This exam was performed according to the departmental dose-optimization program which includes automated exposure control, adjustment of the mA and/or kV according to patient size and/or use of iterative reconstruction technique. COMPARISON:  None Available. FINDINGS: Cardiovascular: No thoracic aortic aneurysm. Aortic atherosclerosis. Three-vessel coronary artery calcifications, particularly dense within the LEFT anterior descending coronary artery. No clinically significant pericardial effusion. Mediastinum/Nodes: No hemorrhage or edema is seen within the mediastinum. No mass or enlarged lymph nodes are seen within the mediastinum. Esophagus is unremarkable. Trachea is unremarkable. Lungs/Pleura: Paraseptal and centrilobular emphysematous changes bilaterally, upper lobe predominant. Bibasilar consolidations, LEFT greater than RIGHT. Small bilateral pleural effusions. No pneumothorax. Upper Abdomen: Limited images of the upper abdomen are unremarkable. Musculoskeletal: Minimally displaced fractures involving the posterior and posterior-lateral segments of the LEFT eleventh rib. Additional slightly displaced fracture of the posterior-lateral LEFT tenth rib and LEFT lateral ninth rib. Chronic wedge compression deformity of the L1 vertebral body. Degenerative spondylosis of the thoracic spine, mild to moderate in degree. IMPRESSION: 1. Minimally displaced to slightly displaced fractures of the LEFT ninth through eleventh ribs, as detailed above. 2. Bibasilar consolidations, LEFT greater than RIGHT, most likely a combination of atelectasis and pneumonia or aspiration. Favor at least some component of aspiration given  the recent trauma. 3. Small bilateral pleural effusions. No pneumothorax. 4. Three-vessel coronary artery calcifications, particularly dense within the LEFT anterior descending coronary artery. Recommend correlation with any possible associated cardiac symptoms. 5. Chronic wedge compression deformity of the L1 vertebral body. Emphysema (ICD10-J43.9). Electronically Signed   By: Franki Cabot M.D.   On: 02/15/2022 14:03   DG Ankle Complete Right  Result Date: 02/15/2022 CLINICAL DATA:  Right ankle pain, status post fall EXAM: RIGHT ANKLE - COMPLETE 3+ VIEW COMPARISON:  08/11/2020 ankle radiographs FINDINGS: Comminuted, mildly displaced and impacted fracture of the distal fibula. Comminuted, mildly displaced and distracted fracture through the medial malleolus. Soft tissue swelling about the ankle. IMPRESSION: Comminuted, mildly displaced and impacted fractures through the distal fibula and medial malleolus. Electronically Signed   By: Merilyn Baba M.D.   On: 02/15/2022 13:09   Family History Reviewed and non-contributory, no pertinent history of problems with bleeding or anesthesia    Review of Systems Unable to assess due to  the patient's cognition   OBJECTIVE  Vitals:Patient Vitals for the past 8 hrs:  BP Temp Temp src Pulse Resp SpO2 Weight  02/17/22 0558 113/77 98.1 F (36.7 C) Oral 95 18 91 % 59.5 kg   General: Pleasantly demented Cardiovascular: Warm extremities noted Respiratory: No cyanosis, no use of accessory musculature GI: No organomegaly, abdomen is soft and non-tender Skin: No lesions in the area of chief complaint other than those listed below in [facility] exam.  Neurologic: Response to light touch Psychiatric: Does not answer questions appropriately Lymphatic: No swelling obvious and reported other than the area involved in the exam below Extremities   RLE: Right lower leg is in a splint which is clean, dry and intact.  Exposed toes are well-perfused.  She responds to light  touch of the toes.  I have seen the toes wiggle.  No skin breakdown at the periphery of the splint.     Test Results Imaging   x-ray of the right ankle demonstrates minimally displaced fractures of the distal fibula, as well as the medial malleolus.  Labs cbc Recent Labs    02/16/22 0446 02/17/22 0348  WBC 8.8 7.5  HGB 10.1* 9.2*  HCT 31.8* 28.8*  PLT 108* 90*     Recent Labs    02/16/22 0446 02/17/22 0348  NA 139 139  K 4.5 4.0  CL 99 101  CO2 30 31  GLUCOSE 96 93  BUN 44* 36*  CREATININE 2.27* 1.28*  CALCIUM 8.9 8.8*

## 2022-02-17 NOTE — Progress Notes (Signed)
PROGRESS NOTE   Cassidy Smith  MVE:[id number] DOB: Feb 05, 1955 DOA: 02/15/2022 PCP: Neale Burly, MD   Chief Complaint  Patient presents with   Fall    Patient found on ground at 0800   Level of care: Med-Surg  Brief Admission History:  67 year old female with history of depression, anxiety, bipolar 1 disorder, chronic DVT, chronic hip pain, dementia, history of UTIs and hypothyroidism, previous intracerebral hematoma, history of hemorrhagic CVA, SIADH, DNR present on admission, history of rhabdomyolysis who apparently has been living alone but having care aides that are with her during the daytime however she remains alone at night.  Historian reported that patient has been having frequent falls recently at home and gait instability which is worsened over the past 3 to 4 days.  She also is having intermittent confusion which is worse from her baseline.  She has had poor oral intake over the past several days according to caretakers.  Patient denies having fever and chills.  Patient denies chest pain and reports having cough and chest congestion.  Patient was found down this morning by her care aides.  She apparently had fallen sometime in the middle of the night.  She had noticeable bruising and swelling to her right ankle and she hit her head.  She has bruising on her left rib cage.  Patient normally ambulates with a walker.  Family reports that patient has not been at her baseline mentation for the past several days.  Workup in the emergency department reveals that patient has severe acute kidney injury, she has a fractured right ankle and multiple rib fractures.  She also has evidence of an aspiration pneumonitis and she was started on IV fluids and IV antibiotics.  Admission was requested for further management.    Assessment and Plan:  Acute renal failure Prerenal secondary to severe dehydration Treating with IV fluid Renally dose medications Follow daily basic metabolic  panel Bladder scan less than 50 cc noted Creatinine improved to 2.27 >> 1.28   Aspiration pneumonia Continue antibiotics as ordered Dysphagia diet ordered with aspiration precautions Supplemental oxygen as needed Bronchodilators and cough suppressants as ordered SLP eval requested   Dysphagia  NPO for now pending SLP eval RN reporting that she is having difficulty with Dys 2 diet Aspiration precautions   Leukocytosis - resolved  Secondary to pneumonia and aspiration Resolved    Right ankle fracture Discussed with orthopedist Dr. Amedeo Kinsman who recommends splinting and outpatient follow-up with orthopedics Pain management as ordered NWB to RLE until ok with orthopedics Appreciate ortho consult, she will remain NWB and outpatient follow up in 2 weeks   Falls Frequently/High Fall Risk Fall precautions recommended PT evaluation requested and recommending SNF TOC consulted for SNF placement  Anticipate she will be medically ready to discharge on 2/5    Bipolar 1 disorder Resumed home behavioral health medications Delirium precautions recommended   Tobacco user Nicotine patch ordered Nurse to counsel on cessation ordered   Hyperthyroidism Resumed home methimazole    DNR present on admission Advance care planning documents have been scanned into the EMR and reviewed   Essential hypertension Diet controlled, following    DVT prophylaxis: [place] heparin  Code Status: DNR  Family Communication: sister  Disposition: Status is: Inpatient Remains inpatient appropriate because: IV fluids required    Consultants:  orthopeds  Procedures:   Antimicrobials:    Subjective: Pt reports pain is controlled.    Objective: Vitals:   02/16/22 2040 02/16/22 2145 02/16/22 2145  02/17/22 0558  BP:  115/68 115/68 113/77  Pulse:  91 91 95  Resp:  '16 16 18  '$ Temp:  97.6 F (36.4 C) 97.6 F (36.4 C) 98.1 F (36.7 C)  TempSrc:  Oral Oral Oral  SpO2: 92% 95% 95% 91%  Weight:    59.5  kg  Height:        Intake/Output Summary (Last 24 hours) at 02/17/2022 1034 Last data filed at 02/17/2022 0606 Gross per 24 hour  Intake 759.26 ml  Output 400 ml  Net 359.26 ml   Filed Weights   02/15/22 1735 02/16/22 0630 02/17/22 0558  Weight: 58 kg 59.6 kg 59.5 kg   Examination:  General exam: Appears calm and comfortable, pain is controlled, intermittently confused. Mucous membranes dry.   Respiratory system: Clear to auscultation. Respiratory effort normal. Cardiovascular system: normal S1 & S2 heard. No JVD, murmurs, rubs, gallops or clicks. No pedal edema. Gastrointestinal system: Abdomen is nondistended, soft and nontender. No organomegaly or masses felt. Normal bowel sounds heard. Central nervous system: Alert and oriented. No focal neurological deficits. Extremities: RLE in cast/brace. Skin: No rashes, lesions or ulcers. Psychiatry: Judgement and insight appear poor. Mood & affect appropriate.   Data Reviewed: I have personally reviewed following labs and imaging studies  CBC: Recent Labs  Lab 02/15/22 1244 02/15/22 1532 02/16/22 0446 02/17/22 0348  WBC 10.7* 9.6 8.8 7.5  NEUTROABS  --   --  6.5 3.7  HGB 11.1* 10.0* 10.1* 9.2*  HCT 34.7* 31.6* 31.8* 28.8*  MCV 95.6 95.8 95.5 94.7  PLT 115* 110* 108* 90*    Basic Metabolic Panel: Recent Labs  Lab 02/15/22 1244 02/15/22 1532 02/16/22 0446 02/17/22 0348  NA 137  --  139 139  K 4.6  --  4.5 4.0  CL 94*  --  99 101  CO2 32  --  30 31  GLUCOSE 110*  --  96 93  BUN 48*  --  44* 36*  CREATININE 3.46* 3.09* 2.27* 1.28*  CALCIUM 9.2  --  8.9 8.8*  MG  --   --  1.8 1.7  PHOS  --   --  3.4  --     CBG: No results for input(s): "GLUCAP" in the last 168 hours.  Recent Results (from the past 240 hour(s))  Blood culture (routine x 2)     Status: None (Preliminary result)   Collection Time: 02/15/22  3:32 PM   Specimen: BLOOD  Result Value Ref Range Status   Specimen Description BLOOD BLOOD RIGHT ARM  Final    Special Requests Blood Culture adequate volume  Final   Culture   Final    NO GROWTH < 24 HOURS Performed at [facility], [address], [place], [place] [address]    Report Status PENDING  Incomplete  Blood culture (routine x 2)     Status: None (Preliminary result)   Collection Time: 02/15/22  3:32 PM   Specimen: BLOOD  Result Value Ref Range Status   Specimen Description BLOOD BLOOD RIGHT HAND AEROBIC BOTTLE ONLY  Final   Special Requests Blood Culture adequate volume  Final   Culture   Final    NO GROWTH < 24 HOURS Performed at [facility], [address], [place], [place] [address]    Report Status PENDING  Incomplete     Radiology Studies: CT Head Wo Contrast  Result Date: 02/15/2022 CLINICAL DATA:  Trauma EXAM: CT HEAD WITHOUT CONTRAST CT CERVICAL SPINE WITHOUT CONTRAST TECHNIQUE:  Multidetector CT imaging of the head and cervical spine was performed following the standard protocol without intravenous contrast. Multiplanar CT image reconstructions of the cervical spine were also generated. RADIATION DOSE REDUCTION: This exam was performed according to the departmental dose-optimization program which includes automated exposure control, adjustment of the mA and/or kV according to patient size and/or use of iterative reconstruction technique. COMPARISON:  CT C Spine 08/02/19, CT head 08/12/20 FINDINGS: CT HEAD FINDINGS Brain: No evidence of acute infarction, hemorrhage, hydrocephalus, extra-axial collection or mass lesion/mass effect. Redemonstrated is chronic infarct in the anterior right temporal lobe with mild ex vacuo dilatation of the right temporal horn. 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 Vascular: No hyperdense vessel or unexpected calcification. Skull: Normal. Negative for fracture or focal lesion. Sinuses/Orbits: No middle ear or mastoid effusion. Paranasal sinuses are clear. Orbits are unremarkable. Other: None. CT CERVICAL SPINE  FINDINGS Alignment: Trace anterolisthesis of C3 on C4, C4 on C5. Skull base and vertebrae: No acute fracture. No primary bone lesion or focal pathologic process. Disc space loss at C6-C7 has slightly progressed from prior exam. Soft tissues and spinal canal: No prevertebral fluid or swelling. No visible canal hematoma. Disc levels: No CT evidence of high-grade spinal canal stenosis. There is multilevel severe degenerative facet disease resulting in moderate neural foraminal stenosis at C3-C4 on the right and severe neural foraminal stenosis at C4-C5 bilaterally. Upper chest: See separately dictated CT chest for additional findings. Severe centrilobular emphysema. Other: None IMPRESSION: CT HEAD: 1. No acute intracranial abnormality. 2. 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 CT CERVICAL SPINE: No acute fracture or traumatic malalignment of the cervical spine. Emphysema (ICD10-J43.9). Electronically Signed   By: Marin Roberts M.D.   On: 02/15/2022 14:04   CT Cervical Spine Wo Contrast  Result Date: 02/15/2022 CLINICAL DATA:  Trauma EXAM: CT HEAD WITHOUT CONTRAST CT CERVICAL SPINE WITHOUT CONTRAST TECHNIQUE: Multidetector CT imaging of the head and cervical spine was performed following the standard protocol without intravenous contrast. Multiplanar CT image reconstructions of the cervical spine were also generated. RADIATION DOSE REDUCTION: This exam was performed according to the departmental dose-optimization program which includes automated exposure control, adjustment of the mA and/or kV according to patient size and/or use of iterative reconstruction technique. COMPARISON:  CT C Spine 08/02/19, CT head 08/12/20 FINDINGS: CT HEAD FINDINGS Brain: No evidence of acute infarction, hemorrhage, hydrocephalus, extra-axial collection or mass lesion/mass effect. Redemonstrated is chronic infarct in the anterior right temporal lobe with mild ex vacuo dilatation of the right temporal  horn. 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 Vascular: No hyperdense vessel or unexpected calcification. Skull: Normal. Negative for fracture or focal lesion. Sinuses/Orbits: No middle ear or mastoid effusion. Paranasal sinuses are clear. Orbits are unremarkable. Other: None. CT CERVICAL SPINE FINDINGS Alignment: Trace anterolisthesis of C3 on C4, C4 on C5. Skull base and vertebrae: No acute fracture. No primary bone lesion or focal pathologic process. Disc space loss at C6-C7 has slightly progressed from prior exam. Soft tissues and spinal canal: No prevertebral fluid or swelling. No visible canal hematoma. Disc levels: No CT evidence of high-grade spinal canal stenosis. There is multilevel severe degenerative facet disease resulting in moderate neural foraminal stenosis at C3-C4 on the right and severe neural foraminal stenosis at C4-C5 bilaterally. Upper chest: See separately dictated CT chest for additional findings. Severe centrilobular emphysema. Other: None IMPRESSION: CT HEAD: 1. No acute intracranial abnormality. 2.  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 CT CERVICAL SPINE: No acute fracture or traumatic malalignment of the cervical spine. Emphysema (ICD10-J43.9). Electronically Signed   By: Marin Roberts M.D.   On: 02/15/2022 14:04   CT Chest Wo Contrast  Result Date: 02/15/2022 CLINICAL DATA:  LEFT chest wall trauma. EXAM: CT CHEST WITHOUT CONTRAST TECHNIQUE: Multidetector CT imaging of the chest was performed following the standard protocol without IV contrast. RADIATION DOSE REDUCTION: This exam was performed according to the departmental dose-optimization program which includes automated exposure control, adjustment of the mA and/or kV according to patient size and/or use of iterative reconstruction technique. COMPARISON:  None Available. FINDINGS: Cardiovascular: No thoracic aortic aneurysm. Aortic atherosclerosis.  Three-vessel coronary artery calcifications, particularly dense within the LEFT anterior descending coronary artery. No clinically significant pericardial effusion. Mediastinum/Nodes: No hemorrhage or edema is seen within the mediastinum. No mass or enlarged lymph nodes are seen within the mediastinum. Esophagus is unremarkable. Trachea is unremarkable. Lungs/Pleura: Paraseptal and centrilobular emphysematous changes bilaterally, upper lobe predominant. Bibasilar consolidations, LEFT greater than RIGHT. Small bilateral pleural effusions. No pneumothorax. Upper Abdomen: Limited images of the upper abdomen are unremarkable. Musculoskeletal: Minimally displaced fractures involving the posterior and posterior-lateral segments of the LEFT eleventh rib. Additional slightly displaced fracture of the posterior-lateral LEFT tenth rib and LEFT lateral ninth rib. Chronic wedge compression deformity of the L1 vertebral body. Degenerative spondylosis of the thoracic spine, mild to moderate in degree. IMPRESSION: 1. Minimally displaced to slightly displaced fractures of the LEFT ninth through eleventh ribs, as detailed above. 2. Bibasilar consolidations, LEFT greater than RIGHT, most likely a combination of atelectasis and pneumonia or aspiration. Favor at least some component of aspiration given the recent trauma. 3. Small bilateral pleural effusions. No pneumothorax. 4. Three-vessel coronary artery calcifications, particularly dense within the LEFT anterior descending coronary artery. Recommend correlation with any possible associated cardiac symptoms. 5. Chronic wedge compression deformity of the L1 vertebral body. Emphysema (ICD10-J43.9). Electronically Signed   By: Franki Cabot M.D.   On: 02/15/2022 14:03   DG Ankle Complete Right  Result Date: 02/15/2022 CLINICAL DATA:  Right ankle pain, status post fall EXAM: RIGHT ANKLE - COMPLETE 3+ VIEW COMPARISON:  08/11/2020 ankle radiographs FINDINGS: Comminuted, mildly displaced  and impacted fracture of the distal fibula. Comminuted, mildly displaced and distracted fracture through the medial malleolus. Soft tissue swelling about the ankle. IMPRESSION: Comminuted, mildly displaced and impacted fractures through the distal fibula and medial malleolus. Electronically Signed   By: Merilyn Baba M.D.   On: 02/15/2022 13:09    Scheduled Meds:  acetaminophen  650 mg Oral Q6H   Or   acetaminophen  650 mg Rectal Q6H   aspirin EC  81 mg Oral Q breakfast   atorvastatin  20 mg Oral q1800   benztropine  1 mg Oral BID   cyanocobalamin  1,000 mcg Oral Daily   dextromethorphan  30 mg Oral BID   divalproex  1,000 mg Oral QHS   feeding supplement  237 mL Oral BID BM   gabapentin  100 mg Oral QHS   heparin  5,000 Units Subcutaneous Q8H   lidocaine  1 patch Transdermal Q24H   methimazole  10 mg Oral QODAY   nicotine  21 mg Transdermal Daily   pantoprazole  40 mg Oral QHS   traZODone  100 mg Oral QHS   Continuous Infusions:  azithromycin 250 mL/hr at 02/16/22 1716   cefTRIAXone (ROCEPHIN)  IV Stopped (02/16/22 1230)  lactated ringers 100 mL/hr at 02/17/22 0930     LOS: 2 days   Time spent: 36 mins  Venezia Sargeant Wynetta Emery, MD How to contact the [facility] Attending or Consulting provider [place] or covering provider during after hours [place], for this patient?  Check the care team in [facility] and look for a) attending/consulting TRH provider listed and b) the [facility] team listed Log into [email or web address] and use [place] universal password to access. If you do not have the password, please contact the hospital operator. Locate the [facility] provider you are looking for under [facility] and page to a number that you can be directly reached. If you still have difficulty reaching the provider, please page the [facility] (Director on Call) for the Hospitalists listed on amion for assistance.  02/17/2022, 10:34 AM

## 2022-02-17 NOTE — TOC Initial Note (Signed)
Transition of Care [facility]) - Initial/Assessment Note    Patient Details  Name: Cassidy Smith MRN: [id number] Date of Birth: 1955-05-31  Transition of Care [facility]) CM/SW Contact:    [address], [place] Phone Number: 02/17/2022, 10:38 AM  Clinical Narrative:                  CSW spoke with pt's son in reference to SNF recommendations. Pt's son states pt will return home, he is not agreeable to SNF placement. Pt's son states pt has round the clock supervision/nursing care. CSW inquired if son would be agreeable to home PT, he states he would. Medicare.gov website provided as son states he wants to inquire with the current company on whether or not they provide PT in addition to nursing. TOC will continue to follow for disposition needs.         Patient Goals and CMS Choice            Expected Discharge Plan and Services                                              Prior Living Arrangements/Services                       Activities of Daily Living Home Assistive Devices/Equipment: Gilford Rile (specify type) ADL Screening (condition at time of admission) Patient's cognitive ability adequate to safely complete daily activities?: No Is the patient deaf or have difficulty hearing?: No Does the patient have difficulty seeing, even when wearing glasses/contacts?: No Does the patient have difficulty concentrating, remembering, or making decisions?: Yes Patient able to express need for assistance with ADLs?: Yes Does the patient have difficulty dressing or bathing?: Yes Independently performs ADLs?: No Does the patient have difficulty walking or climbing stairs?: Yes Weakness of Legs: Both Weakness of Arms/Hands: None  Permission Sought/Granted                  Emotional Assessment              Admission diagnosis:  Acute renal failure (ARF) ([place]) [N17.9] Closed bimalleolar fracture of right ankle, initial encounter [U27.253G] Closed fracture of  multiple ribs of left side, initial encounter [S22.42XA] Patient Active Problem List   Diagnosis Date Noted   Acute renal failure (ARF) ([place]) 02/15/2022   Aspiration pneumonia ([place]) 02/15/2022   Leukocytosis 02/15/2022   Falls frequently 02/15/2022   SIRS (systemic inflammatory response syndrome) ([place]) 08/12/2020   Mild protein malnutrition ([place]) 08/12/2020   Sepsis ([place]) 07/25/2020   Sepsis with acute hypoxic respiratory failure without septic shock ([place])    Dementia without behavioral disturbance ([place])    Palliative care by specialist    Goals of care, counseling/discussion    Pneumonia of right lower lobe due to infectious organism 11/06/2019   Intracerebral hematoma ([place]) 07/04/2019   Closed head injury 07/03/2019   Thrombocytopenia ([place]) 07/03/2019   Thyrotoxicosis, unspecified with thyrotoxic crisis or storm 01/15/2019   History of syndrome of inappropriate antidiuretic hormone (SIADH) 01/15/2019   UTI (urinary tract infection) 01/04/2018   Neuromuscular disorder ([place])    Dysuria 10/30/2017   Fever 08/15/2017   Hypokalemia 08/15/2017   Altered mental state 64/40/3474   Acute metabolic encephalopathy 25/95/6387   Valproic acid toxicity 08/15/2017   Rectal cancer ([place]) 07/10/2017   Chronic deep vein thrombosis (DVT)  of distal vein of lower extremity ([place]) 07/10/2017   Altered mental status    Ischemic stroke ([place])    GERD (gastroesophageal reflux disease) 05/12/2017   GI bleed    SIADH (syndrome of inappropriate ADH production) ([place]) 04/08/2017   Dysplastic rectal polyp    Protein-calorie malnutrition, severe 04/07/2017   Pressure injury of skin 04/04/2017   Anticoagulated 04/03/2017   Stool bloody 04/03/2017   Current every day smoker 04/03/2017   Rectal bleeding    Chronic diarrhea    Hyperthyroidism    S/P ORIF (open reduction internal fixation) fracture right hip IM nail 03/07/17 03/24/2017   Hypomagnesemia 03/07/2017   Fall    Closed intertrochanteric fracture  of hip, right, initial encounter ([place]) 03/06/2017   Radial styloid fracture: right 03/06/2017   Normocytic anemia 03/06/2017   Orthostatic hypotension 06/07/2015   Lower urinary tract infectious disease 06/07/2015   Rhabdomyolysis 06/07/2015   White matter abnormality on MRI of brain 05/14/2013   History of depression 05/14/2013   Hx of anxiety disorder 05/14/2013   Bipolar I disorder, most recent episode (or current) manic ([place]) 05/14/2013   Bipolar 1 disorder ([place]) 05/14/2013   Hyponatremia 05/12/2013   Lacunar infarct, acute ([place]) 05/12/2013   PCP:  Neale Burly, MD Pharmacy:   [place], [place] [address] Phone: [phone] Fax: [place], [place] [address] [place] [address] Phone: [phone] Fax: [phone]     Social Determinants of Health (SDOH) Social History: SDOH Screenings   Food Insecurity: No Food Insecurity (02/15/2022)  Housing: Low Risk  (02/15/2022)  Transportation Needs: No Transportation Needs (02/15/2022)  Utilities: Not At Risk (02/15/2022)  Tobacco Use: Medium Risk (02/15/2022)   SDOH Interventions:     Readmission Risk Interventions    07/26/2020   12:20 PM  Readmission Risk Prevention Plan  Transportation Screening Complete  Home Care Screening Complete  Medication Review (RN CM) Complete

## 2022-02-18 DIAGNOSIS — Z8659 Personal history of other mental and behavioral disorders: Secondary | ICD-10-CM | POA: Diagnosis not present

## 2022-02-18 DIAGNOSIS — N179 Acute kidney failure, unspecified: Secondary | ICD-10-CM | POA: Diagnosis not present

## 2022-02-18 DIAGNOSIS — R296 Repeated falls: Secondary | ICD-10-CM | POA: Diagnosis not present

## 2022-02-18 DIAGNOSIS — D649 Anemia, unspecified: Secondary | ICD-10-CM | POA: Diagnosis not present

## 2022-02-18 LAB — BASIC METABOLIC PANEL
Anion gap: 12 (ref 5–15)
BUN: 29 mg/dL — ABNORMAL HIGH (ref 8–23)
CO2: 24 mmol/L (ref 22–32)
Calcium: 9 mg/dL (ref 8.9–10.3)
Chloride: 103 mmol/L (ref 98–111)
Creatinine, Ser: 0.84 mg/dL (ref 0.44–1.00)
GFR, Estimated: >60 mL/min (ref >60)
Glucose, Bld: 92 mg/dL (ref 70–99)
Glucose, Bld: 88 mg/dL (ref 70–99)
Potassium: 4.3 mmol/L (ref 3.5–5.1)
Sodium: 139 mmol/L (ref 135–145)

## 2022-02-18 LAB — MAGNESIUM: Magnesium: 1.7 mg/dL (ref 1.7–2.4)

## 2022-02-18 MED ORDER — MAGNESIUM SULFATE 2 GM/50ML IV SOLN
2.0000 g | Freq: Once | INTRAVENOUS | Status: AC
  Administered 2022-02-18: 2 g via INTRAVENOUS
  Filled 2022-02-18: qty 50

## 2022-02-18 MED ORDER — LACTATED RINGERS IV SOLN: INTRAVENOUS | Status: DC

## 2022-02-18 NOTE — Care Management Important Message (Signed)
Important Message  Patient Details  Name: Cassidy Smith MRN: [id number] Date of Birth: May 22, 1955   Medicare Important Message Given:  Other (see comment) (spoke with brother Lorrin Goodell at [phone] to review letter, no additional copy needed)     Tommy Medal 02/18/2022, 11:19 AM

## 2022-02-18 NOTE — Evaluation (Signed)
Clinical/Bedside Swallow Evaluation Patient Details  Name: Cassidy Smith MRN: [id number] Date of Birth: 04-08-1955  Today's Date: 02/18/2022 Time: SLP Start Time (ACUTE ONLY): 52 SLP Stop Time (ACUTE ONLY): 1030 SLP Time Calculation (min) (ACUTE ONLY): 25 min  Past Medical History:  Past Medical History:  Diagnosis Date   Anxiety    Bipolar 1 disorder ([place])    Chronic deep vein thrombosis (DVT) of distal vein of lower extremity ([place]) 07/10/2017   Chronic hip pain    Depression    GI bleed    Headache(784.0)    History of UTI/bladder spams.    Hyperthyroidism    Hypokalemia 08/15/2017   Intracerebral hematoma ([place]) 07/04/2019   Ischemic stroke (HCC)    Neuromuscular disorder (HCC)    shaking of hands    Pneumonia of right lower lobe due to infectious organism 11/06/2019   Radial styloid fracture: right 03/06/2017   Rhabdomyolysis 06/07/2015   SIADH (syndrome of inappropriate ADH production) ([place]) 04/08/2017   Thyrotoxicosis, unspecified with thyrotoxic crisis or storm 01/15/2019   Past Surgical History:  Past Surgical History:  Procedure Laterality Date   COLONOSCOPY WITH PROPOFOL N/A 04/05/2017   Procedure: COLONOSCOPY WITH PROPOFOL;  Surgeon: Rogene Houston, MD;  [facility];  Service: Endoscopy;  Laterality: N/A;   INTRAMEDULLARY (IM) NAIL INTERTROCHANTERIC Right 03/07/2017   Procedure: OPEN TREATMENT INTERNAL FIXATION RIGHT HIP WITH GAMA INTRAMEDULARY NAIL;  Surgeon: Carole Civil, MD;  Location: [facility];  Service: Orthopedics;  Laterality: Right;   MULTIPLE EXTRACTIONS WITH ALVEOLOPLASTY N/A 10/30/2012   Procedure: MULTIPLE EXTRACION 5, 6, 8, 9, 10 ,18, 19, 31 WITH MAXILLARY RIGHT AND LEFT  ALVEOLOPLASTY REDUCE MAXILLARY LEFT TUBEROSITY;  Surgeon: Gae Bon, DDS;  Location: [facility];  Service: Oral Surgery;  Laterality: N/A;   POLYPECTOMY  04/05/2017   Procedure: POLYPECTOMY;  Surgeon: Rogene Houston, MD;  [facility];  Service:  Endoscopy;;  recto-sigmoid, rectum   HPI:  67 year old female with history of depression, anxiety, bipolar 1 disorder, chronic DVT, chronic hip pain, dementia, history of UTIs and hypothyroidism, previous intracerebral hematoma, history of hemorrhagic CVA, SIADH, DNR present on admission, history of rhabdomyolysis who apparently has been living alone but having care aides that are with her during the daytime however she remains alone at night. Historian reported that patient has been having frequent falls recently at home and gait instability which is worsened over the past 3 to 4 days.  She also is having intermittent confusion which is worse from her baseline.  She has had poor oral intake over the past several days according to caretakers. Pt found down on the floor at home by caregivers. Workup in the emergency department reveals that patient has severe acute kidney injury, she has a fractured right ankle and multiple rib fractures.  She also has evidence of an aspiration pneumonitis and she was started on IV fluids and IV antibiotics. BSE requested.    Assessment / Plan / Recommendation  Clinical Impression  Pt seen at bedside for clinical swallow evaluation. Pt with what appears to be Tardive Dyskinesia with repetitive, writhing movements of her jaw and tongue. Pt without facial asymmetry. She was unable to swallow upon request or cough. Pt assessed with ice chips, thin via tsp/cup/straw, NTL via cup/straw, puree, mech soft, and regular textures. Pt with reduced labial seal and resulted in occasional loss of bolus (ice chips). Suspect premature spillage with audible swallow and 2 immediate coughs after thins, which did  not occur with straw sips. Pt with prolonged oral transit and impaired masitcation with solids, however did masticate in time. Pt reportedly had a difficult time with the D2 diet previously ordered. Recommend D1/puree and thin liquids with feeder assist and assist for oral care, PO  medications whole or crushed in puree. Suspect that Pt's oral phase deficits negatively impact pharyngeal phase. SLP will follow in acute stay and she may benefit from MBSS to view thins and NTL under imaging.   SLP Visit Diagnosis: Dysphagia, unspecified (R13.10)    Aspiration Risk  Mild aspiration risk    Diet Recommendation Dysphagia 1 (Puree);Thin liquid   Liquid Administration via: Cup;Straw Medication Administration: Whole meds with puree Supervision: Staff to assist with self feeding;Full supervision/cueing for compensatory strategies Compensations: Slow rate;Small sips/bites Postural Changes: Seated upright at 90 degrees;Remain upright for at least 30 minutes after po intake    Other  Recommendations Oral Care Recommendations: Oral care BID;Staff/trained caregiver to provide oral care Other Recommendations: Clarify dietary restrictions    Recommendations for follow up therapy are one component of a multi-disciplinary discharge planning process, led by the attending physician.  Recommendations may be updated based on patient status, additional functional criteria and insurance authorization.  Follow up Recommendations Follow physician's recommendations for discharge plan and follow up therapies      Assistance Recommended at Discharge    Functional Status Assessment Patient has had a recent decline in their functional status and demonstrates the ability to make significant improvements in function in a reasonable and predictable amount of time.  Frequency and Duration min 2x/week  1 week       Prognosis Prognosis for Safe Diet Advancement: Fair Barriers to Reach Goals: Cognitive deficits      Swallow Study   General Date of Onset: 02/15/22 HPI: 67 year old female with history of depression, anxiety, bipolar 1 disorder, chronic DVT, chronic hip pain, dementia, history of UTIs and hypothyroidism, previous intracerebral hematoma, history of hemorrhagic CVA, SIADH, DNR  present on admission, history of rhabdomyolysis who apparently has been living alone but having care aides that are with her during the daytime however she remains alone at night. Historian reported that patient has been having frequent falls recently at home and gait instability which is worsened over the past 3 to 4 days.  She also is having intermittent confusion which is worse from her baseline.  She has had poor oral intake over the past several days according to caretakers. Pt found down on the floor at home by caregivers. Workup in the emergency department reveals that patient has severe acute kidney injury, she has a fractured right ankle and multiple rib fractures.  She also has evidence of an aspiration pneumonitis and she was started on IV fluids and IV antibiotics. BSE requested. Type of Study: Bedside Swallow Evaluation Previous Swallow Assessment: N/A Diet Prior to this Study: NPO Temperature Spikes Noted: No Respiratory Status: Nasal cannula History of Recent Intubation: No Behavior/Cognition: Alert;Cooperative;Pleasant mood Oral Cavity Assessment: Within Functional Limits Oral Care Completed by SLP: Recent completion by staff Oral Cavity - Dentition: Edentulous Vision: Functional for self-feeding Self-Feeding Abilities: Needs assist Patient Positioning: Upright in bed Baseline Vocal Quality: Normal Volitional Cough: Cognitively unable to elicit Volitional Swallow: Unable to elicit    Oral/Motor/Sensory Function Overall Oral Motor/Sensory Function: Mild impairment Facial ROM: Within Functional Limits (tardive dyskinesia, reduced coordination) Facial Symmetry: Within Functional Limits Facial Strength: Within Functional Limits Facial Sensation: Within Functional Limits Lingual ROM: Within Functional Limits Lingual Symmetry: Within  Functional Limits Lingual Strength: Within Functional Limits Lingual Sensation: Within Functional Limits Velum: Within Functional Limits Mandible:  Within Functional Limits   Ice Chips Ice chips: Impaired Presentation: Spoon Oral Phase Impairments: Reduced labial seal Oral Phase Functional Implications: Right anterior spillage   Thin Liquid Thin Liquid: Impaired Presentation: Cup;Spoon;Straw Oral Phase Impairments: Reduced labial seal;Reduced lingual movement/coordination Pharyngeal  Phase Impairments: Cough - Immediate (suspected premature spillage)    Nectar Thick Nectar Thick Liquid: Impaired Presentation: Cup;Straw Oral Phase Impairments: Reduced labial seal   Honey Thick Honey Thick Liquid: Not tested   Puree Puree: Within functional limits Presentation: Spoon   Solid     Solid: Impaired Presentation: Spoon Oral Phase Impairments: Impaired mastication Oral Phase Functional Implications: Impaired mastication;Prolonged oral transit;Oral residue     Thank you,  Genene Churn, [place]  Nyasha Rahilly 02/18/2022,11:28 AM

## 2022-02-18 NOTE — TOC Progression Note (Addendum)
Transition of Care [facility]) - Progression Note    Patient Details  Name: JALONI SORBER MRN: [id number] Date of Birth: 1955-02-27  [facility]) CM/SW Contact  Shade Flood, LCSW Phone Number: 02/18/2022, 12:36 PM  Clinical Narrative:     TOC following. Attempted to reach pt's son to follow up on [facility] provider choice. HIPAA compliant VM message left requesting return call. Originally MD was anticipating dc home today though now pt not stable to dc due to need for MBS.   TOC will follow and continue to assist with dc planning.  1310: Received return call from pt's son. [facility] referral reviewed. Son requests referral to [facility]. TOC will refer prior to dc. Updated MD that son requesting phone call to update him once swallow study complete.        Expected Discharge Plan and Services                                               Social Determinants of Health (SDOH) Interventions SDOH Screenings   Food Insecurity: No Food Insecurity (02/15/2022)  Housing: Low Risk  (02/15/2022)  Transportation Needs: No Transportation Needs (02/15/2022)  Utilities: Not At Risk (02/15/2022)  Tobacco Use: Medium Risk (02/15/2022)    Readmission Risk Interventions    07/26/2020   12:20 PM  Readmission Risk Prevention Plan  Transportation Screening Complete  Home Care Screening Complete  Medication Review (RN CM) Complete

## 2022-02-18 NOTE — Progress Notes (Signed)
PROGRESS NOTE   Cassidy Smith  TKW:[id number] DOB: 1955-10-27 DOA: 02/15/2022 PCP: Neale Burly, MD   Chief Complaint  Patient presents with   Fall    Patient found on ground at 0800   Level of care: Med-Surg  Brief Admission History:  67 year old female with history of depression, anxiety, bipolar 1 disorder, chronic DVT, chronic hip pain, dementia, history of UTIs and hypothyroidism, previous intracerebral hematoma, history of hemorrhagic CVA, SIADH, DNR present on admission, history of rhabdomyolysis who apparently has been living alone but having care aides that are with her during the daytime however she remains alone at night.  Historian reported that patient has been having frequent falls recently at home and gait instability which is worsened over the past 3 to 4 days.  She also is having intermittent confusion which is worse from her baseline.  She has had poor oral intake over the past several days according to caretakers.  Patient denies having fever and chills.  Patient denies chest pain and reports having cough and chest congestion.  Patient was found down this morning by her care aides.  She apparently had fallen sometime in the middle of the night.  She had noticeable bruising and swelling to her right ankle and she hit her head.  She has bruising on her left rib cage.  Patient normally ambulates with a walker.  Family reports that patient has not been at her baseline mentation for the past several days.  Workup in the emergency department reveals that patient has severe acute kidney injury, she has a fractured right ankle and multiple rib fractures.  She also has evidence of an aspiration pneumonitis and she was started on IV fluids and IV antibiotics.  Admission was requested for further management.   Assessment and Plan:  Acute renal failure Prerenal secondary to severe dehydration Treating with IV fluid Renally dose medications Follow daily basic metabolic  panel Bladder scan less than 50 cc noted Creatinine improved to 2.27 >> 1.28   Aspiration pneumonia Continue antibiotics as ordered Dysphagia diet ordered with aspiration precautions Supplemental oxygen as needed Bronchodilators and cough suppressants as ordered SLP eval requested   Dysphagia  NPO for now pending SLP eval SLP now recommending Dys 1 diet and MBS  Aspiration precautions   Leukocytosis - resolved  Secondary to pneumonia and aspiration Resolved    Right ankle fracture Discussed with orthopedist Dr. Amedeo Kinsman who recommends splinting and outpatient follow-up with orthopedics Pain management as ordered NWB to RLE until ok with orthopedics Appreciate ortho consult, she will remain NWB and outpatient follow up in 2 weeks I was told that patient will not go to SNF but son arranged for home with him and 24/7 supervision I have asked TOC to confirm.     Falls Frequently/High Fall Risk Fall precautions recommended PT evaluation requested and recommending SNF TOC consulted for SNF placement  Anticipate she will be medically ready to discharge on 2/5    Bipolar 1 disorder Resumed home behavioral health medications Delirium precautions recommended   Tobacco user Nicotine patch ordered Nurse to counsel on cessation ordered   Hyperthyroidism Resumed home methimazole    DNR present on admission Advance care planning documents have been scanned into the EMR and reviewed   Essential hypertension Diet controlled, following   DVT prophylaxis: [place] heparin  Code Status: DNR  Family Communication: sister  Disposition: Status is: Inpatient Remains inpatient appropriate because: IV fluids required    Consultants:  orthopeds  Procedures:  Antimicrobials:    Subjective: Pt is reporting that she does not feel well today.  She was having some shortness of breath. She denies chest pain.    Objective: Vitals:   02/17/22 0558 02/17/22 1337 02/17/22 2231 02/18/22 0500   BP: 113/77 123/72 (!) 149/82   Pulse: 95 100 (!) 103   Resp: '18 17 20   '$ Temp: 98.1 F (36.7 C) 98.3 F (36.8 C) 98.5 F (36.9 C)   TempSrc: Oral Oral Oral   SpO2: 91% 93% 97%   Weight: 59.5 kg   62.5 kg  Height:        Intake/Output Summary (Last 24 hours) at 02/18/2022 1237 Last data filed at 02/17/2022 2300 Gross per 24 hour  Intake --  Output 500 ml  Net -500 ml   Filed Weights   02/16/22 0630 02/17/22 0558 02/18/22 0500  Weight: 59.6 kg 59.5 kg 62.5 kg   Examination:  General exam: Appears calm and comfortable, pain is controlled, intermittently confused. Mucous membranes dry.   Respiratory system: Clear to auscultation. Respiratory effort normal. Cardiovascular system: normal S1 & S2 heard. No JVD, murmurs, rubs, gallops or clicks. No pedal edema. Gastrointestinal system: Abdomen is nondistended, soft and nontender. No organomegaly or masses felt. Normal bowel sounds heard. Central nervous system: Alert and oriented. No focal neurological deficits. Extremities: RLE in cast/brace. Skin: No rashes, lesions or ulcers. Psychiatry: Judgement and insight appear poor. Mood & affect appropriate.   Data Reviewed: I have personally reviewed following labs and imaging studies  CBC: Recent Labs  Lab 02/15/22 1244 02/15/22 1532 02/16/22 0446 02/17/22 0348  WBC 10.7* 9.6 8.8 7.5  NEUTROABS  --   --  6.5 3.7  HGB 11.1* 10.0* 10.1* 9.2*  HCT 34.7* 31.6* 31.8* 28.8*  MCV 95.6 95.8 95.5 94.7  PLT 115* 110* 108* 90*    Basic Metabolic Panel: Recent Labs  Lab 02/15/22 1244 02/15/22 1532 02/16/22 0446 02/17/22 0348 02/18/22 0424 02/18/22 0857  NA 137  --  139 139  --  139  K 4.6  --  4.5 4.0  --  4.3  CL 94*  --  99 101  --  103  CO2 32  --  30 31  --  24  GLUCOSE 110*  --  96 93 92 88  BUN 48*  --  44* 36*  --  29*  CREATININE 3.46* 3.09* 2.27* 1.28*  --  0.84  CALCIUM 9.2  --  8.9 8.8*  --  9.0  MG  --   --  1.8 1.7 1.7  --   PHOS  --   --  3.4  --   --   --      CBG: No results for input(s): "GLUCAP" in the last 168 hours.  Recent Results (from the past 240 hour(s))  Blood culture (routine x 2)     Status: None (Preliminary result)   Collection Time: 02/15/22  3:32 PM   Specimen: BLOOD  Result Value Ref Range Status   Specimen Description BLOOD BLOOD RIGHT ARM  Final   Special Requests Blood Culture adequate volume  Final   Culture   Final    NO GROWTH 3 DAYS Performed at [facility], [address], [place], [place] [address]    Report Status PENDING  Incomplete  Blood culture (routine x 2)     Status: None (Preliminary result)   Collection Time: 02/15/22  3:32 PM   Specimen: BLOOD  Result Value Ref Range Status  Specimen Description BLOOD BLOOD RIGHT HAND AEROBIC BOTTLE ONLY  Final   Special Requests Blood Culture adequate volume  Final   Culture   Final    NO GROWTH 3 DAYS Performed at [facility], [address] [place], [place] [address]    Report Status PENDING  Incomplete     Radiology Studies: No results found.  Scheduled Meds:  acetaminophen  650 mg Oral Q6H   Or   acetaminophen  650 mg Rectal Q6H   aspirin EC  81 mg Oral Q breakfast   atorvastatin  20 mg Oral q1800   benztropine  1 mg Oral BID   cyanocobalamin  1,000 mcg Oral Daily   dextromethorphan  30 mg Oral BID   divalproex  1,000 mg Oral QHS   feeding supplement  237 mL Oral BID BM   gabapentin  100 mg Oral QHS   heparin  5,000 Units Subcutaneous Q8H   lidocaine  1 patch Transdermal Q24H   methimazole  10 mg Oral QODAY   nicotine  21 mg Transdermal Daily   pantoprazole  40 mg Oral QHS   traZODone  100 mg Oral QHS   Continuous Infusions:  azithromycin 500 mg (02/17/22 1426)   cefTRIAXone (ROCEPHIN)  IV 1 g (02/17/22 1351)   lactated ringers 100 mL/hr at 02/17/22 0930   lactated ringers      LOS: 3 days   Time spent: 35 mins  Sydney Azure Wynetta Emery, MD How to contact the [facility] Attending or Consulting provider [place] or covering provider during after  hours [place], for this patient?  Check the care team in [facility] and look for a) attending/consulting TRH provider listed and b) the [facility] team listed Log into [email or web address] and use [place]'s universal password to access. If you do not have the password, please contact the hospital operator. Locate the [facility] provider you are looking for under [facility] and page to a number that you can be directly reached. If you still have difficulty reaching the provider, please page the [facility] (Director on Call) for the Hospitalists listed on amion for assistance.  02/18/2022, 12:37 PM

## 2022-02-19 ENCOUNTER — Inpatient Hospital Stay (HOSPITAL_COMMUNITY): Payer: Medicare Other

## 2022-02-19 ENCOUNTER — Other Ambulatory Visit (HOSPITAL_COMMUNITY): Payer: Self-pay | Admitting: *Deleted

## 2022-02-19 DIAGNOSIS — N179 Acute kidney failure, unspecified: Secondary | ICD-10-CM | POA: Diagnosis not present

## 2022-02-19 DIAGNOSIS — R0609 Other forms of dyspnea: Secondary | ICD-10-CM

## 2022-02-19 DIAGNOSIS — R296 Repeated falls: Secondary | ICD-10-CM | POA: Diagnosis not present

## 2022-02-19 DIAGNOSIS — I2510 Atherosclerotic heart disease of native coronary artery without angina pectoris: Secondary | ICD-10-CM | POA: Diagnosis not present

## 2022-02-19 DIAGNOSIS — Z8659 Personal history of other mental and behavioral disorders: Secondary | ICD-10-CM | POA: Diagnosis not present

## 2022-02-19 DIAGNOSIS — D649 Anemia, unspecified: Secondary | ICD-10-CM | POA: Diagnosis not present

## 2022-02-19 LAB — ECHOCARDIOGRAM COMPLETE
AR max vel: 2.17 cm2
AV Area VTI: 2.49 cm2
AV Area mean vel: 2.37 cm2
AV Mean grad: 5.4 mmHg
AV Peak grad: 12.1 mmHg
Ao pk vel: 1.74 m/s
Area-P 1/2: 2.91 cm2
Height: 65 in
S' Lateral: 1.8 cm
Weight: 2303.37 oz

## 2022-02-19 LAB — BASIC METABOLIC PANEL
Anion gap: 4 — ABNORMAL LOW (ref 5–15)
BUN: 27 mg/dL — ABNORMAL HIGH (ref 8–23)
CO2: 34 mmol/L — ABNORMAL HIGH (ref 22–32)
Calcium: 8.9 mg/dL (ref 8.9–10.3)
Chloride: 102 mmol/L (ref 98–111)
Creatinine, Ser: 0.8 mg/dL (ref 0.44–1.00)
GFR, Estimated: >60 mL/min (ref >60)
Glucose, Bld: 126 mg/dL — ABNORMAL HIGH (ref 70–99)
Potassium: 4.4 mmol/L (ref 3.5–5.1)
Sodium: 140 mmol/L (ref 135–145)

## 2022-02-19 LAB — MAGNESIUM: Magnesium: 1.9 mg/dL (ref 1.7–2.4)

## 2022-02-19 MED ORDER — LACTATED RINGERS IV SOLN: INTRAVENOUS | Status: DC

## 2022-02-19 MED ORDER — FUROSEMIDE 10 MG/ML IJ SOLN
40.0000 mg | Freq: Once | INTRAMUSCULAR | Status: AC
  Administered 2022-02-19: 40 mg via INTRAVENOUS
  Filled 2022-02-19: qty 4

## 2022-02-19 NOTE — Progress Notes (Signed)
PROGRESS NOTE   Cassidy Smith  AGT:[id number] DOB: Mar 27, 1955 DOA: 02/15/2022 PCP: Neale Burly, MD   Chief Complaint  Patient presents with   Fall    Patient found on ground at 0800   Level of care: Med-Surg  Brief Admission History:  67 year old female with history of depression, anxiety, bipolar 1 disorder, chronic DVT, chronic hip pain, dementia, history of UTIs and hypothyroidism, previous intracerebral hematoma, history of hemorrhagic CVA, SIADH, DNR present on admission, history of rhabdomyolysis who apparently has been living alone but having care aides that are with her during the daytime however she remains alone at night.  Historian reported that patient has been having frequent falls recently at home and gait instability which is worsened over the past 3 to 4 days.  She also is having intermittent confusion which is worse from her baseline.  She has had poor oral intake over the past several days according to caretakers.  Patient denies having fever and chills.  Patient denies chest pain and reports having cough and chest congestion.  Patient was found down this morning by her care aides.  She apparently had fallen sometime in the middle of the night.  She had noticeable bruising and swelling to her right ankle and she hit her head.  She has bruising on her left rib cage.  Patient normally ambulates with a walker.  Family reports that patient has not been at her baseline mentation for the past several days.  Workup in the emergency department reveals that patient has severe acute kidney injury, she has a fractured right ankle and multiple rib fractures.  She also has evidence of an aspiration pneumonitis and she was started on IV fluids and IV antibiotics.  Admission was requested for further management.   Assessment and Plan:  Acute renal failure - treated and resolved  Prerenal secondary to severe dehydration Reduce rate of IV fluid now that AKI resolved  Renally dose  medications Follow daily basic metabolic panel Bladder scan less than 50 cc noted Creatinine improved to 2.27 >> 1.28   Aspiration pneumonia Continue antibiotics as ordered Dysphagia diet ordered with aspiration precautions Supplemental oxygen as needed Bronchodilators and cough suppressants as ordered SLP eval requested   Acute respiratory distress / Flash Pulmonary edema  - stop IV fluids  - Lasix 40 mg IV x 1 dose  - monitor for diuresis effect - recheck BMP in AM   Dysphagia (severe) NPO pending SLP eval SLP now recommending Dys 1 diet and MBS  Aspiration precautions Awaiting for MBS to be done, follow up SLP recommendations    Leukocytosis - resolved  Secondary to pneumonia and aspiration Resolved    Right ankle fracture Discussed with orthopedist Dr. Amedeo Kinsman who recommends splinting and outpatient follow-up with orthopedics Pain management as ordered NWB to RLE until ok with orthopedics Appreciate ortho consult, she will remain NWB and outpatient follow up in 2 weeks I was told that patient will not go to SNF but son arranged for home with him and 24/7 supervision I have asked TOC to confirm.  Home health orders placed.    Falls Frequently/High Fall Risk Fall precautions recommended PT evaluation requested and recommending SNF TOC consulted for SNF placement  Multiple discussions with son and he is adamant he wants pt at his home with [facility] services and he has arranged for 24/7 supervision     Bipolar 1 disorder Resumed home behavioral health medications Delirium precautions recommended   Tobacco user Nicotine patch ordered  Nurse to counsel on cessation ordered   Hyperthyroidism Resumed home methimazole    DNR present on admission Advance care planning documents have been scanned into the EMR and reviewed   Essential hypertension Diet controlled, following   DVT prophylaxis:  heparin  Code Status: DNR  Family Communication: sister  Disposition: Status  is: Inpatient Remains inpatient appropriate because: IV fluids required    Consultants:  orthopeds  Procedures:   Antimicrobials:    Subjective: Pt c/o worsening SOB and stat CXR ordered    Objective: Vitals:   02/19/22 0518 02/19/22 0700 02/19/22 1145 02/19/22 1410  BP: (!) 131/92   133/79  Pulse: 89   (!) 104  Resp: 14     Temp: (!) 97.1 F (36.2 C)   98 F (36.7 C)  TempSrc: Axillary   Oral  SpO2: 100%  100% 92%  Weight:  65.3 kg    Height:        Intake/Output Summary (Last 24 hours) at 02/19/2022 1706 Last data filed at 02/19/2022 1554 Gross per 24 hour  Intake 477.47 ml  Output --  Net 477.47 ml   Filed Weights   02/17/22 0558 02/18/22 0500 02/19/22 0700  Weight: 59.5 kg 62.5 kg 65.3 kg   Examination:  General exam: Appears calm and comfortable, pain is controlled, intermittently confused. Mucous membranes less dry. Edentulous.  Respiratory system: bibasilar crackles heard.  Respiratory effort normal. Cardiovascular system: normal S1 & S2 heard. No JVD, murmurs, rubs, gallops or clicks. No pedal edema. Gastrointestinal system: Abdomen is nondistended, soft and nontender. No organomegaly or masses felt. Normal bowel sounds heard. Central nervous system: Alert and oriented. No focal neurological deficits. Extremities: RLE in cast/brace. Skin: No rashes, lesions or ulcers. Psychiatry: Judgement and insight appear poor. Mood & affect appropriate.   Data Reviewed: I have personally reviewed following labs and imaging studies  CBC: Recent Labs  Lab 02/15/22 1244 02/15/22 1532 02/16/22 0446 02/17/22 0348  WBC 10.7* 9.6 8.8 7.5  NEUTROABS  --   --  6.5 3.7  HGB 11.1* 10.0* 10.1* 9.2*  HCT 34.7* 31.6* 31.8* 28.8*  MCV 95.6 95.8 95.5 94.7  PLT 115* 110* 108* 90*    Basic Metabolic Panel: Recent Labs  Lab 02/15/22 1244 02/15/22 1532 02/16/22 0446 02/17/22 0348 02/18/22 0424 02/18/22 0857 02/19/22 0404  NA 137  --  139 139  --  139 140  K 4.6  --   4.5 4.0  --  4.3 4.4  CL 94*  --  99 101  --  103 102  CO2 32  --  30 31  --  24 34*  GLUCOSE 110*  --  96 93 92 88 126*  BUN 48*  --  44* 36*  --  29* 27*  CREATININE 3.46* 3.09* 2.27* 1.28*  --  0.84 0.80  CALCIUM 9.2  --  8.9 8.8*  --  9.0 8.9  MG  --   --  1.8 1.7 1.7  --  1.9  PHOS  --   --  3.4  --   --   --   --     CBG: No results for input(s): "GLUCAP" in the last 168 hours.  Recent Results (from the past 240 hour(s))  Blood culture (routine x 2)     Status: None (Preliminary result)   Collection Time: 02/15/22  3:32 PM   Specimen: BLOOD  Result Value Ref Range Status   Specimen Description BLOOD BLOOD RIGHT ARM  Final  Special Requests Blood Culture adequate volume  Final   Culture   Final    NO GROWTH 4 DAYS Performed at [facility], [address], [place], [place] [address]    Report Status PENDING  Incomplete  Blood culture (routine x 2)     Status: None (Preliminary result)   Collection Time: 02/15/22  3:32 PM   Specimen: BLOOD  Result Value Ref Range Status   Specimen Description BLOOD BLOOD RIGHT HAND AEROBIC BOTTLE ONLY  Final   Special Requests Blood Culture adequate volume  Final   Culture   Final    NO GROWTH 4 DAYS Performed at [facility], [address] [place], [place] [address]    Report Status PENDING  Incomplete     Radiology Studies: DG CHEST PORT 1 VIEW  Result Date: 02/19/2022 CLINICAL DATA:  Acute respiratory distress EXAM: PORTABLE CHEST 1 VIEW COMPARISON:  08/12/2020 FINDINGS: Cardiomegaly. Aortic atherosclerosis. Interstitial pulmonary edema. Bilateral effusions with dependent atelectasis, left more than right. Findings most consistent with congestive heart failure. Coexistent pneumonia not excluded by radiography however. IMPRESSION: Congestive heart failure pattern with interstitial edema, bilateral effusions and dependent atelectasis, left more than right. Cannot exclude coexistent pneumonia. Electronically Signed   By: Nelson Chimes  M.D.   On: 02/19/2022 16:29   ECHOCARDIOGRAM COMPLETE  Result Date: 02/19/2022    ECHOCARDIOGRAM REPORT   Patient Name:   Cassidy Smith Date of Exam: 02/19/2022 Medical Rec #:  [id number]       Height:       65.0 in Accession #:    [id number]      Weight:       144.0 lb Date of Birth:  April 20, 1955       BSA:          1.720 m Patient Age:    65 years        BP:           131/92 mmHg Patient Gender: F               HR:           90 bpm. Exam Location:  [facility] Procedure: 2D Echo, Cardiac Doppler and Color Doppler Indications:    CAD Native Vessel I25.10                 Dyspnea R06.00  History:        Patient has no prior history of Echocardiogram examinations.                 Stroke, Signs/Symptoms:Altered Mental Status; Risk                 Factors:Current Smoker.  Sonographer:    Greer Pickerel Referring Phys: [id number] [address]le[address] Iba  Sonographer Comments: Image acquisition challenging due to patient body habitus and Image acquisition challenging due to respiratory motion. IMPRESSIONS  1. Left ventricular ejection fraction, by estimation, is 70 to 75%. The left ventricle has hyperdynamic function. Left ventricular endocardial border not optimally defined to evaluate regional wall motion. Left ventricular diastolic parameters are indeterminate. Elevated left atrial pressure.  2. Right ventricular systolic function was not well visualized. The right ventricular size is not well visualized.  3. A small pericardial effusion is present. The pericardial effusion is circumferential. There is no evidence of cardiac tamponade.  4. The mitral valve is normal in structure. No evidence of mitral valve regurgitation. No evidence of mitral stenosis.  5. Tricuspid valve regurgitation is  mild to moderate.  6. The aortic valve is tricuspid. There is mild calcification of the aortic valve. There is mild thickening of the aortic valve. Aortic valve regurgitation is not visualized. No aortic stenosis is present.  7. The inferior vena  cava is dilated in size with <50% respiratory variability, suggesting right atrial pressure of 15 mmHg. FINDINGS  Left Ventricle: Left ventricular ejection fraction, by estimation, is 70 to 75%. The left ventricle has hyperdynamic function. Left ventricular endocardial border not optimally defined to evaluate regional wall motion. The left ventricular internal cavity size was normal in size. There is no left ventricular hypertrophy. Left ventricular diastolic parameters are indeterminate. Elevated left atrial pressure. Right Ventricle: The right ventricular size is not well visualized. Right vetricular wall thickness was not well visualized. Right ventricular systolic function was not well visualized. Left Atrium: Left atrial size was not well visualized. Right Atrium: Right atrial size was not well visualized. Pericardium: A small pericardial effusion is present. The pericardial effusion is circumferential. There is no evidence of cardiac tamponade. Mitral Valve: The mitral valve is normal in structure. No evidence of mitral valve regurgitation. No evidence of mitral valve stenosis. Tricuspid Valve: The tricuspid valve is not well visualized. Tricuspid valve regurgitation is mild to moderate. Aortic Valve: The aortic valve is tricuspid. There is mild calcification of the aortic valve. There is mild thickening of the aortic valve. There is mild aortic valve annular calcification. Aortic valve regurgitation is not visualized. No aortic stenosis  is present. Aortic valve mean gradient measures 5.4 mmHg. Aortic valve peak gradient measures 12.1 mmHg. Aortic valve area, by VTI measures 2.49 cm. Pulmonic Valve: The pulmonic valve was not well visualized. Pulmonic valve regurgitation is not visualized. No evidence of pulmonic stenosis. Aorta: The aortic root is normal in size and structure. Venous: The inferior vena cava is dilated in size with less than 50% respiratory variability, suggesting right atrial pressure of 15  mmHg. IAS/Shunts: The interatrial septum was not well visualized.  LEFT VENTRICLE PLAX 2D LVIDd:         3.20 cm   Diastology LVIDs:         1.80 cm   LV e' medial:    5.91 cm/s LV PW:         1.00 cm   LV E/e' medial:  16.1 LV IVS:        0.90 cm   LV e' lateral:   7.15 cm/s LVOT diam:     1.90 cm   LV E/e' lateral: 13.3 LV SV:         82 LV SV Index:   48 LVOT Area:     2.84 cm  RIGHT VENTRICLE RV S prime:     8.83 cm/s TAPSE (M-mode): 1.2 cm LEFT ATRIUM           Index        RIGHT ATRIUM           Index LA diam:      3.30 cm 1.92 cm/m   RA Area:     15.40 cm LA Vol (A4C): 36.9 ml 21.45 ml/m  RA Volume:   41.60 ml  24.18 ml/m  AORTIC VALVE AV Area (Vmax):    2.17 cm AV Area (Vmean):   2.37 cm AV Area (VTI):     2.49 cm AV Vmax:           173.74 cm/s AV Vmean:          106.109  cm/s AV VTI:            0.329 m AV Peak Grad:      12.1 mmHg AV Mean Grad:      5.4 mmHg LVOT Vmax:         133.00 cm/s LVOT Vmean:        88.600 cm/s LVOT VTI:          0.289 m LVOT/AV VTI ratio: 0.88  AORTA Ao Root diam: 3.80 cm MITRAL VALVE MV Area (PHT): 2.91 cm     SHUNTS MV Decel Time: 261 msec     Systemic VTI:  0.29 m MV E velocity: 95.20 cm/s   Systemic Diam: 1.90 cm MV A velocity: 103.00 cm/s MV E/A ratio:  0.92 Carlyle Dolly MD Electronically signed by Carlyle Dolly MD Signature Date/Time: 02/19/2022/11:58:45 AM    Final     Scheduled Meds:  acetaminophen  650 mg Oral Q6H   Or   acetaminophen  650 mg Rectal Q6H   aspirin EC  81 mg Oral Q breakfast   atorvastatin  20 mg Oral q1800   benztropine  1 mg Oral BID   cyanocobalamin  1,000 mcg Oral Daily   divalproex  1,000 mg Oral QHS   feeding supplement  237 mL Oral BID BM   furosemide  40 mg Intravenous Once   gabapentin  100 mg Oral QHS   heparin  5,000 Units Subcutaneous Q8H   lidocaine  1 patch Transdermal Q24H   methimazole  10 mg Oral QODAY   nicotine  21 mg Transdermal Daily   pantoprazole  40 mg Oral QHS   traZODone  100 mg Oral QHS   Continuous  Infusions:  azithromycin 500 mg (02/19/22 1608)   lactated ringers Stopped (02/19/22 1354)    LOS: 4 days   Time spent: 35 mins  Lorin Hauck Wynetta Emery, MD How to contact the [facility] Attending or Consulting provider [place] or covering provider during after hours [place], for this patient?  Check the care team in [facility] and look for a) attending/consulting [facility] provider listed and b) the [facility] team listed Log into [email or web address] and use [place] universal password to access. If you do not have the password, please contact the hospital operator. Locate the [facility] provider you are looking for under [facility] and page to a number that you can be directly reached. If you still have difficulty reaching the provider, please page the [facility] (Director on Call) for the Hospitalists listed on amion for assistance.  02/19/2022, 5:06 PM

## 2022-02-19 NOTE — Evaluation (Signed)
Modified Barium Swallow Study  Patient Details  Name: Cassidy Smith MRN: [id number] Date of Birth: [birth year]  Today's Date: 02/19/2022  Modified Barium Swallow completed.  Full report located under Chart Review in the Imaging Section.  History of Present Illness 67 year old female with history of depression, anxiety, bipolar 1 disorder, chronic DVT, chronic hip pain, dementia, history of UTIs and hypothyroidism, previous intracerebral hematoma, history of hemorrhagic CVA, SIADH, DNR present on admission, history of rhabdomyolysis who apparently has been living alone but having care aides that are with her during the daytime however she remains alone at night. Historian reported that patient has been having frequent falls recently at home and gait instability which is worsened over the past 3 to 4 days.  She also is having intermittent confusion which is worse from her baseline.  She has had poor oral intake over the past several days according to caretakers. Pt found down on the floor at home by caregivers. Workup in the emergency department reveals that patient has severe acute kidney injury, she has a fractured right ankle and multiple rib fractures.  She also has evidence of an aspiration pneumonitis and she was started on IV fluids and IV antibiotics. BSE requested.   Clinical Impression Pt presents with mild/moderate oropharyngeal dysphagia that is negatively impacted by Tardive Dyskinesia (TD) which causes repetitive, writhing movements of her jaw and tongue. Pt's decreased oral coordination and bolus cohesion result in poor oral containment anteriorly and posteriorly; swallowing is triggered at the level of the pyriforms, trace to moderate pharyngeal residue of thin is aspirated in trace amounts; aspiration is sensed most of the time but aspirates were not expelled from the airway. Note aspiration does not occur with every thin presentation (not noted with very small controlled cup sips). Pt is  physically unable to maintain chin tuck or consistently utilize any attempted compensatory strategies secondary to her TD. Despite decreased coordination and slow swallowing trigger at the pyriforms NTL, puree and regular are within functional limits with only trace residue on oral and pharyngeal structures with no penetration or aspiration visualized. Pt is at high risk for aspiration with thin liquids and suspect Pt has been compensating for her TD, however her current deconditioned Pt is frequently aspirating thin liquids in trace amounts. Recommend continue with D1/puree and downgrade Pt's liquids to NTL. Pt is a good candidate for free water protocol and recommend dysphagia therapy with repeat MBSS when clinically indicated.  Factors that may increase risk of adverse event in presence of aspiration Phineas Douglas & Padilla 2021): Weak cough;Dependence for feeding and/or oral hygiene  Swallow Evaluation Recommendations Recommendations: PO diet PO Diet Recommendation: Dysphagia 1 (Pureed);Mildly thick liquids (Level 2, nectar thick) Liquid Administration via: Cup;Straw Medication Administration: Crushed with puree Supervision: Full assist for feeding Swallowing strategies  : Slow rate;Small bites/sips;Follow solids with liquids;Clear throat intermittently Oral care recommendations: Oral care BID (2x/day);Oral care before ice chips/water Caregiver Recommendations: Avoid jello, ice cream, thin soups, popsicles;Remove water pitcher     Vetta Couzens H. Roddie Mc, CCC-SLP Speech Language Pathologist  Wende Bushy 02/19/2022,5:33 PM

## 2022-02-19 NOTE — Progress Notes (Signed)
Pt A&O x 1. Pt hypertensive, other vitals stable. No c/o pain or discomfort. Expiratory wheezes auscultated in upper lobes.

## 2022-02-19 NOTE — Progress Notes (Signed)
  Echocardiogram 2D Echocardiogram has been performed.  Cassidy Smith 02/19/2022, 11:46 AM

## 2022-02-19 NOTE — Progress Notes (Signed)
Physical Therapy Treatment Patient Details Name: Cassidy Smith MRN: [id number] DOB: 09/20/1955 Today's Date: 02/19/2022   History of Present Illness patient has been having frequent falls recently at home and gait instability which is worsened over the past 3 to 4 days.  She also is having intermittent confusion which is worse from her baseline.  She has had poor oral intake over the past several days according to caretakers.  Patient denies having fever and chills.  Patient denies chest pain and reports having cough and chest congestion.     Patient was found down this morning by her care aides.  She apparently had fallen sometime in the middle of the night.  She had noticeable bruising and swelling to her right ankle and she hit her head.  She has bruising on her left rib cage.  Patient normally ambulates with a walker.  Family reports that patient has not been at her baseline mentation for the past several days.     Workup in the emergency department reveals that patient has severe acute kidney injury, she has a fractured right ankle and multiple rib fractures.  She also has evidence of an aspiration pneumonitis and she was started on IV fluids and IV antibiotics.  Admission was requested for further management.    PT Comments    Patient showed limited tolerance to therapeutic exercises at bed level due to increased fatigue and pain in RLE.  With continued motivation to progress EOB therapeutic exercises, patient showing increased hesitancy.  Patient continues to be limited in safe mobility and ambulation due to pain, continued muscle weakness WB status.  Patient would continue to benefit from skilled acute physical therapy services to improve functional activity tolerance and improve capacity to perform OOB activities.  Recommendations for follow up therapy are one component of a multi-disciplinary discharge planning process, led by the attending physician.  Recommendations may be updated based on  patient status, additional functional criteria and insurance authorization.  Follow Up Recommendations  Skilled nursing-short term rehab (<3 hours/day) Can patient physically be transported by private vehicle: No   Assistance Recommended at Discharge Frequent or constant Supervision/Assistance  Patient can return home with the following A lot of help with walking and/or transfers;A lot of help with bathing/dressing/bathroom;Assistance with cooking/housework   Equipment Recommendations  None recommended by PT    Recommendations for Other Services       Precautions / Restrictions Precautions Precautions: Fall Restrictions Weight Bearing Restrictions: Yes RLE Weight Bearing: Non weight bearing Other Position/Activity Restrictions: RLE     Mobility  Bed Mobility Overal bed mobility: Needs Assistance Bed Mobility: Rolling, Supine to Sit           General bed mobility comments: Attempted supine to sit to perform EOB therapeutic exercises, patient hesitant to perform bed mobility requesting therapeutic exercises at bed level. attempted to continue with EOB exercises, patient hesitant for bed mobility. Patient Response: Cooperative  Transfers                        Ambulation/Gait                   Stairs             Wheelchair Mobility    Modified Rankin (Stroke Patients Only)       Balance  Cognition Arousal/Alertness: Awake/alert Behavior During Therapy:  (Recurrent reorientation to therapeutic exercise as with multiple variations of questions.) Overall Cognitive Status: Within Functional Limits for tasks assessed Area of Impairment: Attention, Following commands                   Current Attention Level: Alternating   Following Commands: Follows one step commands inconsistently                Exercises General Exercises - Lower Extremity Ankle Circles/Pumps:  AROM, Left, 10 reps Quad Sets: AROM, Both, 20 reps Hip ABduction/ADduction: Right, 20 reps, AAROM    General Comments        Pertinent Vitals/Pain Pain Assessment Pain Assessment: 0-10 Pain Score: 10-Worst pain ever Pain Location: Pointed to RLE Pain Descriptors / Indicators: Aching Pain Intervention(s): Limited activity within patient's tolerance    Home Living                          Prior Function            PT Goals (current goals can now be found in the care plan section) Progress towards PT goals: Progressing toward goals    Frequency    Min 4X/week      PT Plan Current plan remains appropriate    Co-evaluation              AM-PAC PT "6 Clicks" Mobility   Outcome Measure                   End of Session   Activity Tolerance: Patient limited by pain;Patient limited by fatigue;Other (comment) (When attempting to further progress therapeutic intervention patient reported, "I am too tired")   Nurse Communication: Mobility status PT Visit Diagnosis: Unsteadiness on feet (R26.81);Repeated falls (R29.6);Muscle weakness (generalized) (M62.81);History of falling (Z91.81)     Time: 6644-0347 PT Time Calculation (min) (ACUTE ONLY): 15 min  Charges:  $Therapeutic Exercise: 8-22 mins                     Wonda Olds PT, DPT Physical Therapist with [place] [phone] office    Wonda Olds 02/19/2022, 10:16 AM

## 2022-02-20 ENCOUNTER — Inpatient Hospital Stay (HOSPITAL_COMMUNITY): Payer: Medicare Other

## 2022-02-20 DIAGNOSIS — I5031 Acute diastolic (congestive) heart failure: Secondary | ICD-10-CM | POA: Diagnosis not present

## 2022-02-20 DIAGNOSIS — N179 Acute kidney failure, unspecified: Secondary | ICD-10-CM | POA: Diagnosis not present

## 2022-02-20 DIAGNOSIS — J690 Pneumonitis due to inhalation of food and vomit: Secondary | ICD-10-CM | POA: Diagnosis not present

## 2022-02-20 DIAGNOSIS — J9601 Acute respiratory failure with hypoxia: Secondary | ICD-10-CM | POA: Diagnosis not present

## 2022-02-20 LAB — BLOOD GAS, VENOUS
Acid-Base Excess: 19.8 mmol/L — ABNORMAL HIGH (ref 0.0–2.0)
Bicarbonate: 47.5 mmol/L — ABNORMAL HIGH (ref 20.0–28.0)
Drawn by: 7478
O2 Saturation: 87 %
Patient temperature: 37
pCO2, Ven: 75 mmHg (ref 44–60)
pH, Ven: 7.41 (ref 7.25–7.43)
pO2, Ven: 55 mmHg — ABNORMAL HIGH (ref 32–45)

## 2022-02-20 LAB — RESP PANEL BY RT-PCR (RSV, FLU A&B, COVID)  RVPGX2
Influenza A by PCR: NEGATIVE
Influenza B by PCR: NEGATIVE
Resp Syncytial Virus by PCR: NEGATIVE
SARS Coronavirus 2 by RT PCR: NEGATIVE

## 2022-02-20 LAB — CBC
HCT: 26.4 % — ABNORMAL LOW (ref 36.0–46.0)
Hemoglobin: 9 g/dL — ABNORMAL LOW (ref 12.0–15.0)
MCH: 35.3 pg — ABNORMAL HIGH (ref 26.0–34.0)
MCHC: 34.1 g/dL (ref 30.0–36.0)
MCV: 103.5 fL — ABNORMAL HIGH (ref 80.0–100.0)
Platelets: 52 10*3/uL — ABNORMAL LOW (ref 150–400)
RBC: 2.55 MIL/uL — ABNORMAL LOW (ref 3.87–5.11)
RDW: 19.5 % — ABNORMAL HIGH (ref 11.5–15.5)
WBC: 7.9 10*3/uL (ref 4.0–10.5)
nRBC: 0.5 % — ABNORMAL HIGH (ref 0.0–0.2)

## 2022-02-20 LAB — GRAM STAIN

## 2022-02-20 LAB — BODY FLUID CELL COUNT WITH DIFFERENTIAL
Lymphs, Fluid: 81 %
Monocyte-Macrophage-Serous Fluid: 2 % — ABNORMAL LOW (ref 50–90)
Neutrophil Count, Fluid: 16 % (ref 0–25)
Total Nucleated Cell Count, Fluid: 995 cu mm (ref 0–1000)

## 2022-02-20 LAB — BASIC METABOLIC PANEL
Anion gap: 7 (ref 5–15)
BUN: 20 mg/dL (ref 8–23)
CO2: 36 mmol/L — ABNORMAL HIGH (ref 22–32)
Calcium: 9.4 mg/dL (ref 8.9–10.3)
Chloride: 100 mmol/L (ref 98–111)
Creatinine, Ser: 0.7 mg/dL (ref 0.44–1.00)
GFR, Estimated: >60 mL/min (ref >60)
Glucose, Bld: 108 mg/dL — ABNORMAL HIGH (ref 70–99)
Potassium: 3.8 mmol/L (ref 3.5–5.1)
Sodium: 143 mmol/L (ref 135–145)

## 2022-02-20 LAB — LACTATE DEHYDROGENASE, PLEURAL OR PERITONEAL FLUID: LD, Fluid: 69 U/L — ABNORMAL HIGH (ref 3–23)

## 2022-02-20 LAB — PROCALCITONIN: Procalcitonin: 0.17 ng/mL

## 2022-02-20 LAB — BRAIN NATRIURETIC PEPTIDE: B Natriuretic Peptide: 921 pg/mL — ABNORMAL HIGH (ref 0.0–100.0)

## 2022-02-20 LAB — PROTEIN, PLEURAL OR PERITONEAL FLUID: Total protein, fluid: <3 g/dL

## 2022-02-20 LAB — MAGNESIUM: Magnesium: 1.5 mg/dL — ABNORMAL LOW (ref 1.7–2.4)

## 2022-02-20 LAB — VALPROIC ACID LEVEL: Valproic Acid Lvl: 39 ug/mL — ABNORMAL LOW (ref 50.0–100.0)

## 2022-02-20 MED ORDER — BUDESONIDE 0.5 MG/2ML IN SUSP
0.5000 mg | Freq: Two times a day (BID) | RESPIRATORY_TRACT | Status: DC
  Administered 2022-02-20 – 2022-02-24 (×8): 0.5 mg via RESPIRATORY_TRACT
  Filled 2022-02-20 (×9): qty 2

## 2022-02-20 MED ORDER — FUROSEMIDE 10 MG/ML IJ SOLN: 40.0000 mg | Freq: Every day | INTRAMUSCULAR | Status: DC

## 2022-02-20 MED ORDER — MAGNESIUM SULFATE 2 GM/50ML IV SOLN
2.0000 g | Freq: Once | INTRAVENOUS | Status: AC
  Administered 2022-02-20: 2 g via INTRAVENOUS
  Filled 2022-02-20: qty 50

## 2022-02-20 MED ORDER — IPRATROPIUM-ALBUTEROL 0.5-2.5 (3) MG/3ML IN SOLN
3.0000 mL | Freq: Four times a day (QID) | RESPIRATORY_TRACT | Status: DC
  Administered 2022-02-20 – 2022-02-21 (×6): 3 mL via RESPIRATORY_TRACT
  Filled 2022-02-20 (×7): qty 3

## 2022-02-20 MED ORDER — FUROSEMIDE 10 MG/ML IJ SOLN
40.0000 mg | Freq: Once | INTRAMUSCULAR | Status: AC
  Administered 2022-02-20: 40 mg via INTRAVENOUS
  Filled 2022-02-20: qty 4

## 2022-02-20 NOTE — NC FL2 (Signed)
[place] LEVEL OF CARE FORM     IDENTIFICATION  Patient Name: Cassidy Smith Birthdate: 17-Apr-1955 Sex: female Admission Date (Current Location): 02/15/2022  [facility] and [place] Number:  [employer] and Address:  [place] [address] [place]      Provider Number: [id number]  Attending Physician Name and Address:  Orson Eva, MD  Relative Name and Phone Number:  Hadassah, Rana  [phone]    Current Level of Care:   Recommended Level of Care: [place] Prior Approval Number:    Date Approved/Denied:   PASRR Number: Pending  Discharge Plan: SNF    Current Diagnoses: Patient Active Problem List   Diagnosis Date Noted   Acute heart failure with preserved ejection fraction (HFpEF) ([place]) 02/20/2022   Acute respiratory failure with hypoxia ([place]) 02/20/2022   AKI (acute kidney injury) ([place]) 02/15/2022   Aspiration pneumonia ([place]) 02/15/2022   Leukocytosis 02/15/2022   Falls frequently 02/15/2022   SIRS (systemic inflammatory response syndrome) ([place]) 08/12/2020   Mild protein malnutrition ([place]) 08/12/2020   Sepsis ([place]) 07/25/2020   Sepsis with acute hypoxic respiratory failure without septic shock ([place])    Dementia without behavioral disturbance ([place])    Palliative care by specialist    Goals of care, counseling/discussion    Pneumonia of right lower lobe due to infectious organism 11/06/2019   Intracerebral hematoma ([place]) 07/04/2019   Closed head injury 07/03/2019   Thrombocytopenia ([place]) 07/03/2019   Thyrotoxicosis, unspecified with thyrotoxic crisis or storm 01/15/2019   History of syndrome of inappropriate antidiuretic hormone (SIADH) 01/15/2019   UTI (urinary tract infection) 01/04/2018   Neuromuscular disorder ([place])    Dysuria 10/30/2017   Fever 08/15/2017   Hypokalemia 08/15/2017   Altered mental state 24/40/1027   Acute metabolic encephalopathy 25/36/6440   Valproic acid toxicity  08/15/2017   Rectal cancer ([place]) 07/10/2017   Chronic deep vein thrombosis (DVT) of distal vein of lower extremity (HCC) 07/10/2017   Altered mental status    Ischemic stroke ([place])    GERD (gastroesophageal reflux disease) 05/12/2017   GI bleed    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   Dysplastic rectal polyp    Protein-calorie malnutrition, severe 04/07/2017   Pressure injury of skin 04/04/2017   Anticoagulated 04/03/2017   Stool bloody 04/03/2017   Current every day smoker 04/03/2017   Rectal bleeding    Chronic diarrhea    Hyperthyroidism    S/P ORIF (open reduction internal fixation) fracture right hip IM nail 03/07/17 03/24/2017   Hypomagnesemia 03/07/2017   Fall    Closed intertrochanteric fracture of hip, right, initial encounter ([place]) 03/06/2017   Radial styloid fracture: right 03/06/2017   Normocytic anemia 03/06/2017   Orthostatic hypotension 06/07/2015   Lower urinary tract infectious disease 06/07/2015   Rhabdomyolysis 06/07/2015   White matter abnormality on MRI of brain 05/14/2013   History of depression 05/14/2013   Hx of anxiety disorder 05/14/2013   Bipolar I disorder, most recent episode (or current) manic ([place]) 05/14/2013   Bipolar 1 disorder ([place]) 05/14/2013   Hyponatremia 05/12/2013   Lacunar infarct, acute ([place]) 05/12/2013    Orientation RESPIRATION BLADDER Height & Weight     Self  O2 (Currently on 8L) External catheter Weight: 65 kg Height:  '5\' 5"'$  (165.1 cm)  BEHAVIORAL SYMPTOMS/MOOD NEUROLOGICAL BOWEL NUTRITION STATUS      Incontinent Diet  AMBULATORY STATUS COMMUNICATION OF NEEDS Skin     Verbally  Bruising                       Personal Care Assistance Level of Assistance  Bathing, Feeding, Dressing Bathing Assistance: Maximum assistance Feeding assistance: Limited assistance Dressing Assistance: Maximum assistance     Functional Limitations Info  Sight, Hearing, Speech Sight Info: Adequate Hearing Info:  Adequate Speech Info: Impaired    SPECIAL CARE FACTORS FREQUENCY  PT (By licensed PT)     PT Frequency: 5 times a week              Contractures Contractures Info: Not present    Additional Factors Info  Code Status, Allergies Code Status Info: DNR Allergies Info: NKDA           Current Medications (02/20/2022):  This is the current hospital active medication list Current Facility-Administered Medications  Medication Dose Route Frequency Provider Last Rate Last Admin   acetaminophen (TYLENOL) tablet 650 mg  650 mg Oral Q6H Johnson, Clanford L, MD   650 mg at 02/20/22 0915   Or   acetaminophen (TYLENOL) suppository 650 mg  650 mg Rectal Q6H Johnson, Clanford L, MD   650 mg at 02/18/22 0514   aspirin EC tablet 81 mg  81 mg Oral Q breakfast Johnson, Clanford L, MD   81 mg at 02/20/22 0915   atorvastatin (LIPITOR) tablet 20 mg  20 mg Oral q1800 Johnson, Clanford L, MD   20 mg at 02/19/22 1736   benztropine (COGENTIN) tablet 1 mg  1 mg Oral BID Wynetta Emery, Clanford L, MD   1 mg at 02/20/22 0915   bisacodyl (DULCOLAX) EC tablet 5 mg  5 mg Oral Daily PRN Johnson, Clanford L, MD       budesonide (PULMICORT) nebulizer solution 0.5 mg  0.5 mg Nebulization BID Tat, David, MD       cyanocobalamin (VITAMIN B12) tablet 1,000 mcg  1,000 mcg Oral Daily Wynetta Emery, Clanford L, MD   1,000 mcg at 02/20/22 0914   divalproex (DEPAKOTE ER) 24 hr tablet 1,000 mg  1,000 mg Oral QHS Johnson, Clanford L, MD   1,000 mg at 02/19/22 2230   feeding supplement (ENSURE ENLIVE / ENSURE PLUS) liquid 237 mL  237 mL Oral BID BM Johnson, Clanford L, MD   237 mL at 02/20/22 1331   [START ON 02/21/2022] furosemide (LASIX) injection 40 mg  40 mg Intravenous Daily Tat, David, MD       gabapentin (NEURONTIN) capsule 100 mg  100 mg Oral QHS Johnson, Clanford L, MD   100 mg at 02/19/22 2230   heparin injection 5,000 Units  5,000 Units Subcutaneous Q8H Johnson, Clanford L, MD   5,000 Units at 02/20/22 0501   hydrALAZINE  (APRESOLINE) injection 10 mg  10 mg Intravenous Q4H PRN Johnson, Clanford L, MD       ipratropium-albuterol (DUONEB) 0.5-2.5 (3) MG/3ML nebulizer solution 3 mL  3 mL Nebulization Q6H PRN Johnson, Clanford L, MD   3 mL at 02/19/22 1145   ipratropium-albuterol (DUONEB) 0.5-2.5 (3) MG/3ML nebulizer solution 3 mL  3 mL Nebulization Q6H Tat, David, MD   3 mL at 02/20/22 1421   lidocaine (LIDODERM) 5 % 1 patch  1 patch Transdermal Q24H Johnson, Clanford L, MD   1 patch at 02/19/22 1401   methimazole (TAPAZOLE) tablet 10 mg  10 mg Oral QODAY Johnson, Clanford L, MD   10 mg at 02/20/22 0914   nicotine (NICODERM CQ - dosed in mg/24 hours) patch 21  mg  21 mg Transdermal Daily Johnson, Clanford L, MD   21 mg at 02/19/22 0822   ondansetron (ZOFRAN) tablet 4 mg  4 mg Oral Q6H PRN Johnson, Clanford L, MD       Or   ondansetron (ZOFRAN) injection 4 mg  4 mg Intravenous Q6H PRN Johnson, Clanford L, MD       pantoprazole (PROTONIX) EC tablet 40 mg  40 mg Oral QHS Johnson, Clanford L, MD   40 mg at 02/19/22 2229   traZODone (DESYREL) tablet 100 mg  100 mg Oral QHS Johnson, Clanford L, MD   100 mg at 02/19/22 2229     Discharge Medications: Please see discharge summary for a list of discharge medications.  Relevant Imaging Results:  Relevant Lab Results:   Additional Information SS# [id number]  Boneta Lucks, RN

## 2022-02-20 NOTE — TOC Progression Note (Signed)
30 Day Note   Patient Details  Name: Cassidy Smith MRN: [id number] Date of Birth: 09/20/1955  Transition of Care [facility]) CM/SW Contact  Boneta Lucks, RN Phone Number: 02/20/2022, 2:24 PM        To whom it May Concern: Please be advised that the above name patient will require a short-term nursing home stay- anticipated 30 days or less rehabilitation and strengthening. The plan is for return home.

## 2022-02-20 NOTE — Progress Notes (Signed)
   02/20/22 0905  Oxygen Therapy  SpO2 (!) 78 %  O2 Device Nasal Cannula  O2 Flow Rate (L/min) 8 L/min  Provider Notification  Provider Name/Title Dr Tat  Date Provider Notified 02/20/22  Time Provider Notified [phone]  Method of Notification Page  Notification Reason Change in status;Critical Result (sat 78 %, EXP WHEEZING)  Date Critical Result Received 02/20/22  Time Critical Result Received 0906  Provider response At bedside  Date of Provider Response 02/20/22  Time of Provider Response [phone]

## 2022-02-20 NOTE — Progress Notes (Signed)
   02/20/22 1046  Provider Notification  Provider Name/Title Dr Tat  Date Provider Notified 02/20/22  Time Provider Notified 1045  Method of Notification Page  Notification Reason Critical Result  Test performed and critical result PCO2 WAS 75  Date Critical Result Received 02/20/22  Time Critical Result Received 9223  Provider response See new orders  Date of Provider Response 02/20/22  Time of Provider Response 1048

## 2022-02-20 NOTE — TOC Progression Note (Signed)
Transition of Care [facility]) - Progression Note    Patient Details  Name: Cassidy Smith MRN: [id number] Date of Birth: 14-Feb-1955  Transition of Care [facility]) CM/SW Contact  Boneta Lucks, RN Phone Number: 02/20/2022, 2:29 PM  Clinical Narrative-    Patient son call RN, TOC consulted to now send out for SNF bed offers. CM called Zach. He thinks is would be best for her to go to SNF and allow family more time to fill positions for care at home. FL2 completed. Patient will need a PASSR review. Uploaded documents as requested by Olney Must.  Patient has been to [facility] in the passed, she is vaccinated. He requested to send out for more bed offers and review with him.     Expected Discharge Plan: [place] Barriers to Discharge: Continued Medical Work up  Expected Discharge Plan and Services      Social Determinants of Health (SDOH) Interventions SDOH Screenings   Food Insecurity: No Food Insecurity (02/15/2022)  Housing: Low Risk  (02/15/2022)  Transportation Needs: No Transportation Needs (02/15/2022)  Utilities: Not At Risk (02/15/2022)  Tobacco Use: Medium Risk (02/15/2022)    Readmission Risk Interventions    07/26/2020   12:20 PM  Readmission Risk Prevention Plan  Transportation Screening Complete  Home Care Screening Complete  Medication Review (RN CM) Complete

## 2022-02-20 NOTE — TOC Progression Note (Signed)
Transition of Care [facility]) - Progression Note    Patient Details  Name: Cassidy Smith MRN: [id number] Date of Birth: 12/22/1955  [facility]) CM/SW Contact  Boneta Lucks, RN Phone Number: 02/20/2022, 1:03 PM  Clinical Narrative:   MD ordering HHPT/RN/SW. Son requested [facility]. [name] accepted the referral. Added to AVS.   Expected Discharge Plan: [place] Barriers to Discharge: Continued Medical Work up  Expected Discharge Plan and [place] with Home health   Social Determinants of Health (SDOH) Interventions SDOH Screenings   Food Insecurity: No Food Insecurity (02/15/2022)  Housing: Low Risk  (02/15/2022)  Transportation Needs: No Transportation Needs (02/15/2022)  Utilities: Not At Risk (02/15/2022)  Tobacco Use: Medium Risk (02/15/2022)    Readmission Risk Interventions    07/26/2020   12:20 PM  Readmission Risk Prevention Plan  Transportation Screening Complete  Home Care Screening Complete  Medication Review (RN CM) Complete

## 2022-02-20 NOTE — Progress Notes (Addendum)
Thoracentesis complete 866m clear red colored pleural fluid removed, patient being taken to xray for post thoracentesis chest xray.

## 2022-02-20 NOTE — Progress Notes (Signed)
   02/20/22 0906  Vitals  Temp 97.6 F (36.4 C)  Temp Source Axillary  BP (!) 156/79  MAP (mmHg) 87  BP Location Right Arm  BP Method Automatic  Patient Position (if appropriate) Lying  Pulse Rate 88  Pulse Rate Source Dinamap  Resp (!) 28  MEWS COLOR  MEWS Score Color Yellow  Oxygen Therapy  SpO2 95 %  O2 Device Nasal Cannula  O2 Flow Rate (L/min) 8 L/min  MEWS Score  MEWS Temp 0  MEWS Systolic 0  MEWS Pulse 0  MEWS RR 2  MEWS LOC 0  MEWS Score 2  Provider Notification  Provider Name/Title Dr Tat  Date Provider Notified 02/20/22  Time Provider Notified 0906  Method of Notification Page  Notification Reason Change in status  Provider response At bedside  Date of Provider Response 02/20/22  Time of Provider Response [phone]

## 2022-02-20 NOTE — Progress Notes (Signed)
PROGRESS NOTE  Cassidy Smith VVO:[id number] DOB: 07-06-1955 DOA: 02/15/2022 PCP: Neale Burly, MD  Brief History:  67 year old female with history of depression, anxiety, bipolar 1 disorder, chronic DVT, chronic hip pain, dementia, history of UTIs and hypothyroidism, previous intracerebral hematoma, history of hemorrhagic CVA, SIADH, DNR present on admission, history of rhabdomyolysis who apparently has been living alone but having care aides that are with her during the daytime however she remains alone at night.  Historian reported that patient has been having frequent falls recently at home and gait instability which is worsened over the past 3 to 4 days.  She also is having intermittent confusion which is worse from her baseline.  She has had poor oral intake over the past several days according to caretakers.  Patient denies having fever and chills.  Patient denies chest pain and reports having cough and chest congestion.  Patient was found down this morning by her care aides.  She apparently had fallen sometime in the middle of the night.  She had noticeable bruising and swelling to her right ankle and she hit her head.  She has bruising on her left rib cage.  Patient normally ambulates with a walker.  Family reports that patient has not been at her baseline mentation for the past several days.  Workup in the emergency department reveals that patient has severe acute kidney injury, she has a fractured right ankle and multiple rib fractures.  She also has evidence of an aspiration pneumonitis and she was started on IV fluids and IV antibiotics.  Admission was requested for further management.     Assessment/Plan: AKI- treated and resolved  Prerenal secondary to severe dehydration Initially fluid resuscitated Renally dose medications Follow daily basic metabolic panel Creatinine improved to 2.27 >> 0.8   Aspiration pneumonia Had 5 days ceftriaxone and azithro--finished  2/6 Dysphagia diet ordered with aspiration precautions Supplemental oxygen as needed Bronchodilators and cough suppressants as ordered SLP eval requested    Acute respiratory distress /Acute respiratory failure with hypoxia - stop IV fluids  - start lasix IV - personally reviewed CXR--pulm edema, bilateral pleural effusion L>R - request Left side thora  Acute HFpEF -2/6 echo--EF 70-75%, mid-mod TR, small pericardial eff -start daily IV lasix -accurate I/O -request Left thoracocentesis   Dysphagia (severe) NPO initially SLP now recommending Dys 1 diet with NTL Aspiration precautions   Leukocytosis - resolved  Secondary to pneumonia and aspiration Resolved    Right ankle fracture Discussed with orthopedist Dr. Amedeo Kinsman who recommends splinting and outpatient follow-up with orthopedics Pain management as ordered NWB to RLE until ok with orthopedics Appreciate ortho consult, she will remain NWB and outpatient follow up in 2 weeks I was told that patient will not go to SNF--son prefers [facility] --pt lives with brother with [facility] aide   Falls Frequently/High Fall Risk Fall precautions recommended PT evaluation requested and recommending SNF TOC consulted for SNF placement  Multiple discussions with son and he is adamant he wants pt at his home with [facility] services and he has arranged for 24/7 supervision     Bipolar 1 disorder Resumed home behavioral health medications Delirium precautions recommended   Tobacco user/COPD Nicotine patch ordered Nurse to counsel on cessation ordered -nearly 100 pack year history   Hyperthyroidism Resumed home methimazole    DNR present on admission Advance care planning documents have been scanned into the EMR and reviewed   Essential hypertension Diet controlled,  following      Family Communication:   son updated 2/7  Consultants:  none  Code Status:  DNR  DVT Prophylaxis:  [place] Heparin   Procedures: As Listed in Progress Note  Above  Antibiotics: Ceftriaxone 2/2>>2/6 Azithro 2/2>>2/6    Subjective: Pt complains of sob.  Denies cp or abd pain.  Remainder REOS   Objective: Vitals:   02/20/22 0904 02/20/22 0905 02/20/22 0906 02/20/22 0934  BP:   (!) 156/79 (!) 157/72  Pulse:   88 90  Resp:   (!) 28 (!) 30  Temp:   97.6 F (36.4 C) (!) 97.4 F (36.3 C)  TempSrc:   Axillary Axillary  SpO2: (!) 86% (!) 78% 95% 98%  Weight:      Height:        Intake/Output Summary (Last 24 hours) at 02/20/2022 0959 Last data filed at 02/20/2022 0636 Gross per 24 hour  Intake 867.47 ml  Output 1600 ml  Net -732.53 ml   Weight change: -0.3 kg Exam:  General:  Pt is alert, follows commands appropriately, not in acute distress HEENT: No icterus, No thrush, No neck mass, [place]/AT Cardiovascular: RRR, S1/S2, no rubs, no gallops Respiratory: diminished BS.  Bilateral exp wheeze.  Bibasilar rales Abdomen: Soft/+BS, non tender, non distended, no guarding Extremities: No edema, No lymphangitis, No petechiae, No rashes, no synovitis   Data Reviewed: I have personally reviewed following labs and imaging studies Basic Metabolic Panel: Recent Labs  Lab 02/15/22 1244 02/15/22 1532 02/16/22 0446 02/17/22 0348 02/18/22 0424 02/18/22 0857 02/19/22 0404  NA 137  --  139 139  --  139 140  K 4.6  --  4.5 4.0  --  4.3 4.4  CL 94*  --  99 101  --  103 102  CO2 32  --  30 31  --  24 34*  GLUCOSE 110*  --  96 93 92 88 126*  BUN 48*  --  44* 36*  --  29* 27*  CREATININE 3.46* 3.09* 2.27* 1.28*  --  0.84 0.80  CALCIUM 9.2  --  8.9 8.8*  --  9.0 8.9  MG  --   --  1.8 1.7 1.7  --  1.9  PHOS  --   --  3.4  --   --   --   --    Liver Function Tests: No results for input(s): "AST", "ALT", "ALKPHOS", "BILITOT", "PROT", "ALBUMIN" in the last 168 hours. No results for input(s): "LIPASE", "AMYLASE" in the last 168 hours. No results for input(s): "AMMONIA" in the last 168 hours. Coagulation Profile: No results for input(s): "INR",  "PROTIME" in the last 168 hours. CBC: Recent Labs  Lab 02/15/22 1244 02/15/22 1532 02/16/22 0446 02/17/22 0348  WBC 10.7* 9.6 8.8 7.5  NEUTROABS  --   --  6.5 3.7  HGB 11.1* 10.0* 10.1* 9.2*  HCT 34.7* 31.6* 31.8* 28.8*  MCV 95.6 95.8 95.5 94.7  PLT 115* 110* 108* 90*   Cardiac Enzymes: Recent Labs  Lab 02/15/22 1244  CKTOTAL 114   BNP: Invalid input(s): "POCBNP" CBG: No results for input(s): "GLUCAP" in the last 168 hours. HbA1C: No results for input(s): "HGBA1C" in the last 72 hours. Urine analysis:    Component Value Date/Time   COLORURINE YELLOW 08/12/2020 1339   APPEARANCEUR CLEAR 08/12/2020 1339   LABSPEC 1.015 08/12/2020 1339   PHURINE 8.0 08/12/2020 1339   GLUCOSEU NEGATIVE 08/12/2020 1339   HGBUR NEGATIVE 08/12/2020 1339   BILIRUBINUR  NEGATIVE 08/12/2020 1339   KETONESUR 5 (A) 08/12/2020 1339   PROTEINUR NEGATIVE 08/12/2020 1339   UROBILINOGEN 0.2 05/12/2013 0210   NITRITE NEGATIVE 08/12/2020 1339   LEUKOCYTESUR NEGATIVE 08/12/2020 1339   Sepsis Labs: '@LABRCNTIP'$ (procalcitonin:4,lacticidven:4) ) Recent Results (from the past 240 hour(s))  Blood culture (routine x 2)     Status: None (Preliminary result)   Collection Time: 02/15/22  3:32 PM   Specimen: BLOOD  Result Value Ref Range Status   Specimen Description BLOOD BLOOD RIGHT ARM  Final   Special Requests Blood Culture adequate volume  Final   Culture   Final    NO GROWTH 4 DAYS Performed at [facility], [address], [place], [place] [address]    Report Status PENDING  Incomplete  Blood culture (routine x 2)     Status: None (Preliminary result)   Collection Time: 02/15/22  3:32 PM   Specimen: BLOOD  Result Value Ref Range Status   Specimen Description BLOOD BLOOD RIGHT HAND AEROBIC BOTTLE ONLY  Final   Special Requests Blood Culture adequate volume  Final   Culture   Final    NO GROWTH 4 DAYS Performed at [facility], [address], [place], [place] [address]    Report Status PENDING   Incomplete     Scheduled Meds:  acetaminophen  650 mg Oral Q6H   Or   acetaminophen  650 mg Rectal Q6H   aspirin EC  81 mg Oral Q breakfast   atorvastatin  20 mg Oral q1800   benztropine  1 mg Oral BID   budesonide (PULMICORT) nebulizer solution  0.5 mg Nebulization BID   cyanocobalamin  1,000 mcg Oral Daily   divalproex  1,000 mg Oral QHS   feeding supplement  237 mL Oral BID BM   gabapentin  100 mg Oral QHS   heparin  5,000 Units Subcutaneous Q8H   ipratropium-albuterol  3 mL Nebulization Q6H   lidocaine  1 patch Transdermal Q24H   methimazole  10 mg Oral QODAY   nicotine  21 mg Transdermal Daily   pantoprazole  40 mg Oral QHS   traZODone  100 mg Oral QHS   Continuous Infusions:  Procedures/Studies: DG CHEST PORT 1 VIEW  Result Date: 02/20/2022 CLINICAL DATA:  Respiratory distress EXAM: PORTABLE CHEST 1 VIEW COMPARISON:  02/19/2022 FINDINGS: Stable cardiomediastinal contours. Aortic atherosclerosis. Pulmonary vascular congestion with diffusely prominent interstitial markings bilaterally. Persistent small right pleural effusion. Moderate-large left-sided pleural effusion has increased from prior with worsening aeration at the left lung base. No pneumothorax. IMPRESSION: 1. Moderate-large left-sided pleural effusion has increased from prior with worsening aeration at the left lung base. 2. Persistent small right pleural effusion. Electronically Signed   By: Davina Poke D.O.   On: 02/20/2022 09:50   DG Swallowing Func-Speech Pathology  Result Date: 02/19/2022 Table formatting from the original result was not included. Modified Barium Swallow Study Patient Details Name: CJ BEECHER MRN: [id number] Date of Birth: Jun 05, 1955 Today's Date: 02/19/2022 HPI/PMH: HPI: 67 year old female with history of depression, anxiety, bipolar 1 disorder, chronic DVT, chronic hip pain, dementia, history of UTIs and hypothyroidism, previous intracerebral hematoma, history of hemorrhagic CVA, SIADH, DNR  present on admission, history of rhabdomyolysis who apparently has been living alone but having care aides that are with her during the daytime however she remains alone at night. Historian reported that patient has been having frequent falls recently at home and gait instability which is worsened over the past 3 to 4 days.  She also is having intermittent confusion which is worse from her baseline.  She has had poor oral intake over the past several days according to caretakers. Pt found down on the floor at home by caregivers. Workup in the emergency department reveals that patient has severe acute kidney injury, she has a fractured right ankle and multiple rib fractures.  She also has evidence of an aspiration pneumonitis and she was started on IV fluids and IV antibiotics. BSE requested. Clinical Impression: Clinical Impression: Pt presents with mild/moderate oropharyngeal dysphagia that is negatively impacted by Tardive Dyskinesia (TD) which causes repetitive, writhing movements of her jaw and tongue. Pt's decreased oral coordination and bolus cohesion result in poor oral containment anteriorly and posteriorly; swallowing is triggered at the level of the pyriforms, trace to moderate pharyngeal residue of thin is aspirated in trace amounts; aspiration is sensed most of the time but aspirates were not expelled from the airway. Note aspiration does not occur with every thin presentation (not noted with very small controlled cup sips). Pt is physically unable to maintain chin tuck or consistently utilize any attempted compensatory strategies secondary to her TD. Despite decreased coordination and slow swallowing trigger at the pyriforms NTL, puree and regular are within functional limits with only trace residue on oral and pharyngeal structures with no penetration or aspiration visualized. Pt is at high risk for aspiration with thin liquids and suspect Pt has been compensating for her TD, however her current  deconditioned Pt is frequently aspirating thin liquids in trace amounts. Recommend continue with D1/puree and downgrade Pt's liquids to NTL. Pt is a good candidate for free water protocol and recommend dysphagia therapy with repeat MBSS when clinically indicated. Factors that may increase risk of adverse event in presence of aspiration ([place] 2021): Weak cough; Dependence for feeding and/or oral hygiene Recommendations/Plan: Swallowing Evaluation Recommendations Swallowing Evaluation Recommendations Recommendations: PO diet PO Diet Recommendation: Dysphagia 1 (Pureed); Mildly thick liquids (Level 2, nectar thick) Liquid Administration via: Cup; Straw Medication Administration: Crushed with puree Supervision: Full assist for feeding Swallowing strategies  : Slow rate; Small bites/sips; Follow solids with liquids; Clear throat intermittently Oral care recommendations: Oral care BID (2x/day); Oral care before ice chips/water Caregiver Recommendations: Avoid jello, ice cream, thin soups, popsicles; Remove water pitcher Treatment Plan Treatment Plan Follow-up recommendations: Skilled nursing-short term rehab (<3 hours/day) Functional status assessment: Patient has had a recent decline in their functional status and demonstrates the ability to make significant improvements in function in a reasonable and predictable amount of time. Treatment frequency: Min 2x/week Treatment duration: 2 weeks Interventions: Aspiration precaution training; Oropharyngeal exercises; Compensatory techniques; Patient/family education; Trials of upgraded texture/liquids; Diet toleration management by SLP; Respiratory muscle strength training Recommendations Recommendations for follow up therapy are one component of a multi-disciplinary discharge planning process, led by the attending physician.  Recommendations may be updated based on patient status, additional functional criteria and insurance authorization. Assessment: Orofacial  Exam: Orofacial Exam Oral Cavity: Oral Hygiene: Xerostomia Oral Cavity - Dentition: Edentulous Orofacial Anatomy: WFL Oral Motor/Sensory Function: WFL Anatomy: Anatomy: WFL Thin Liquids: Thin Liquids (Level 0) Thin Liquids : Impaired Bolus delivery method: Spoon; Cup Thin Liquid - Impairment: Oral Impairment; Pharyngeal impairment Lip Closure: Central anterior loss Tongue control during bolus hold: Escape to lateral buccal cavity/floor of mouth; Posterior escape of less than half of bolus Bolus transport/lingual motion: Delayed initiation of tongue motion (oral holding); Repetitive/disorganized tongue motion Oral residue: Trace residue lining oral structures Location of oral residue :  Tongue; Floor of mouth; Palate Initiation of swallow : Pyriform sinuses Soft palate elevation: Complete Tongue base retraction: Trace column of contrast or air between tongue base and PPW Laryngeal elevation: Partial or minimal superior movement and approximation Anterior hyoid excursion: Partial Epiglottic movement: Complete Laryngeal vestibule closure: Incomplete Pharyngeal stripping wave : Present - diminished Pharyngeal contraction (A/P view only): N/A Pharyngoesophageal segment opening: Complete distension and complete duration, no obstruction of flow Pharyngeal residue: Collection of residue within or on pharyngeal structures Location of pharyngeal residue: Valleculae; Pharyngeal wall; Pyriform sinuses; Diffuse (>3 areas) Penetration/Aspiration Scale (PAS) score: 7.  Material enters airway, passes BELOW cords and not ejected out despite cough attempt by patient  Mildly Thick Liquids: Mildly thick liquids (Level 2, nectar thick) Mildly thick liquids (Level 2, nectar thick): Impaired Bolus delivery method: Cup; Spoon Mildly Thick Liquid - Impairment: Oral Impairment; Pharyngeal impairment Lip Closure: Central anterior loss Tongue control during bolus hold: Escape to lateral buccal cavity/floor of mouth; Posterior escape of less  than half of bolus Bolus transport/lingual motion: Repetitive/disorganized tongue motion; Slow tongue motion Oral residue: Trace residue lining oral structures; Residue collection on oral structures Location of oral residue : Floor of mouth; Tongue; Palate Initiation of swallow : Pyriform sinuses Soft palate elevation: Complete Tongue base retraction: Trace column of contrast or air between tongue base and PPW; Narrow column of contrast or air between tongue base and PPW Laryngeal elevation: Complete superior movement of thyroid cartilage with complete approximation of arytenoids to epiglottic petiole Anterior hyoid excursion: Complete Epiglottic movement: Complete Laryngeal vestibule closure: Complete: No air/contrast in laryngeal vestibule Pharyngeal stripping wave : Present - diminished Pharyngeal contraction (A/P view only): N/A Pharyngoesophageal segment opening: Complete distension and complete duration, no obstruction of flow Pharyngeal residue: Collection of residue within or on pharyngeal structures Location of pharyngeal residue: Valleculae; Pharyngeal wall; Pyriform sinuses; Diffuse (>3 areas); Tongue base; Aryepiglottic folds Penetration/Aspiration Scale (PAS) score: 1.  Material does not enter airway  Moderately Thick Liquids: Moderately thick liquids (Level 3, honey thick) Moderately thick liquids (Level 3, honey thick): WFL  Puree: Puree Puree: WFL Solid: Solid Solid: WFL Pill: Pill Pill: Not Tested Compensatory Strategies: Compensatory Strategies Compensatory strategies: Yes Chin tuck: Ineffective   General Information: Caregiver present: No  Diet Prior to this Study: Dysphagia 1 (pureed); Thin liquids (Level 0)   Temperature : Normal   Respiratory Status: WFL   Supplemental O2: None (Room air)   History of Recent Intubation: No  Behavior/Cognition: Alert; Cooperative; Pleasant mood Self-Feeding Abilities: Dependent for feeding Baseline vocal quality/speech: Normal Volitional Cough: Able to elicit  Volitional Swallow: Unable to elicit No data recorded Goal Planning: Prognosis for improved oropharyngeal function: Fair Barriers to Reach Goals: Severity of deficits; Overall medical prognosis No data recorded Patient/Family Stated Goal: N/A Consulted and agree with results and recommendations: Patient; Nurse Pain: Pain Assessment Pain Assessment: 0-10 Pain Score: 10 Pain Location: Pointed to RLE Pain Descriptors / Indicators: Aching Pain Intervention(s): Limited activity within patient's tolerance End of Session: Start Time:SLP Start Time (ACUTE ONLY): 1430 Stop Time: SLP Stop Time (ACUTE ONLY): 1502 Time Calculation:SLP Time Calculation (min) (ACUTE ONLY): 32 min Charges: SLP Evaluations $ SLP Speech Visit: 1 Visit SLP Evaluations $BSS Swallow: 1 Procedure $MBS Swallow: 1 Procedure SLP visit diagnosis: SLP Visit Diagnosis: Dysphagia, unspecified (R13.10) Past Medical History: Past Medical History: Diagnosis Date  Anxiety   Bipolar 1 disorder (HCC)   Chronic deep vein thrombosis (DVT) of distal vein of lower extremity (HCC) 07/10/2017  Chronic  hip pain   Depression   GI bleed   Headache(784.0)   History of UTI/bladder spams.   Hyperthyroidism   Hypokalemia 08/15/2017  Intracerebral hematoma ([place]) 07/04/2019  Ischemic stroke (HCC)   Neuromuscular disorder (HCC)   shaking of hands   Pneumonia of right lower lobe due to infectious organism 11/06/2019  Radial styloid fracture: right 03/06/2017  Rhabdomyolysis 06/07/2015  SIADH (syndrome of inappropriate ADH production) ([place]) 04/08/2017  Thyrotoxicosis, unspecified with thyrotoxic crisis or storm 01/15/2019 Past Surgical History: Past Surgical History: Procedure Laterality Date  COLONOSCOPY WITH PROPOFOL N/A 04/05/2017  Procedure: COLONOSCOPY WITH PROPOFOL;  Surgeon: Rogene Houston, MD;  Location: [facility];  Service: Endoscopy;  Laterality: N/A;  INTRAMEDULLARY (IM) NAIL INTERTROCHANTERIC Right 03/07/2017  Procedure: OPEN TREATMENT INTERNAL FIXATION RIGHT HIP WITH  GAMA INTRAMEDULARY NAIL;  Surgeon: Carole Civil, MD;  Location: [facility];  Service: Orthopedics;  Laterality: Right;  MULTIPLE EXTRACTIONS WITH ALVEOLOPLASTY N/A 10/30/2012  Procedure: MULTIPLE EXTRACION 5, 6, 8, 9, 10 ,18, 19, 31 WITH MAXILLARY RIGHT AND LEFT  ALVEOLOPLASTY REDUCE MAXILLARY LEFT TUBEROSITY;  Surgeon: Gae Bon, DDS;  Location: [facility];  Service: Oral Surgery;  Laterality: N/A;  POLYPECTOMY  04/05/2017  Procedure: POLYPECTOMY;  Surgeon: Rogene Houston, MD;  Location: [facility];  Service: Endoscopy;;  recto-sigmoid, rectum Amelia H. Roddie Mc, CCC-SLP Speech Language Pathologist Wende Bushy 02/19/2022, 6:05 PM  DG CHEST PORT 1 VIEW  Result Date: 02/19/2022 CLINICAL DATA:  Acute respiratory distress EXAM: PORTABLE CHEST 1 VIEW COMPARISON:  08/12/2020 FINDINGS: Cardiomegaly. Aortic atherosclerosis. Interstitial pulmonary edema. Bilateral effusions with dependent atelectasis, left more than right. Findings most consistent with congestive heart failure. Coexistent pneumonia not excluded by radiography however. IMPRESSION: Congestive heart failure pattern with interstitial edema, bilateral effusions and dependent atelectasis, left more than right. Cannot exclude coexistent pneumonia. Electronically Signed   By: Nelson Chimes M.D.   On: 02/19/2022 16:29   ECHOCARDIOGRAM COMPLETE  Result Date: 02/19/2022    ECHOCARDIOGRAM REPORT   Patient Name:   MALLORY SCHAAD Date of Exam: 02/19/2022 Medical Rec #:  [id number]       Height:       65.0 in Accession #:    [id number]      Weight:       144.0 lb Date of Birth:  [birth year]       BSA:          1.720 m Patient Age:    2 years        BP:           131/92 mmHg Patient Gender: F               HR:           90 bpm. Exam Location:  [facility] Procedure: 2D Echo, Cardiac Doppler and Color Doppler Indications:    CAD Native Vessel I25.10                 Dyspnea R06.00  History:        Patient has no prior history of Echocardiogram examinations.                  Stroke, Signs/Symptoms:Altered Mental Status; Risk                 Factors:Current Smoker.  Sonographer:    Greer Pickerel Referring Phys: [id number] Murlean Iba  Sonographer Comments: Image acquisition challenging due to patient body habitus and Image acquisition challenging  due to respiratory motion. IMPRESSIONS  1. Left ventricular ejection fraction, by estimation, is 70 to 75%. The left ventricle has hyperdynamic function. Left ventricular endocardial border not optimally defined to evaluate regional wall motion. Left ventricular diastolic parameters are indeterminate. Elevated left atrial pressure.  2. Right ventricular systolic function was not well visualized. The right ventricular size is not well visualized.  3. A small pericardial effusion is present. The pericardial effusion is circumferential. There is no evidence of cardiac tamponade.  4. The mitral valve is normal in structure. No evidence of mitral valve regurgitation. No evidence of mitral stenosis.  5. Tricuspid valve regurgitation is mild to moderate.  6. The aortic valve is tricuspid. There is mild calcification of the aortic valve. There is mild thickening of the aortic valve. Aortic valve regurgitation is not visualized. No aortic stenosis is present.  7. The inferior vena cava is dilated in size with <50% respiratory variability, suggesting right atrial pressure of 15 mmHg. FINDINGS  Left Ventricle: Left ventricular ejection fraction, by estimation, is 70 to 75%. The left ventricle has hyperdynamic function. Left ventricular endocardial border not optimally defined to evaluate regional wall motion. The left ventricular internal cavity size was normal in size. There is no left ventricular hypertrophy. Left ventricular diastolic parameters are indeterminate. Elevated left atrial pressure. Right Ventricle: The right ventricular size is not well visualized. Right vetricular wall thickness was not well visualized. Right ventricular  systolic function was not well visualized. Left Atrium: Left atrial size was not well visualized. Right Atrium: Right atrial size was not well visualized. Pericardium: A small pericardial effusion is present. The pericardial effusion is circumferential. There is no evidence of cardiac tamponade. Mitral Valve: The mitral valve is normal in structure. No evidence of mitral valve regurgitation. No evidence of mitral valve stenosis. Tricuspid Valve: The tricuspid valve is not well visualized. Tricuspid valve regurgitation is mild to moderate. Aortic Valve: The aortic valve is tricuspid. There is mild calcification of the aortic valve. There is mild thickening of the aortic valve. There is mild aortic valve annular calcification. Aortic valve regurgitation is not visualized. No aortic stenosis  is present. Aortic valve mean gradient measures 5.4 mmHg. Aortic valve peak gradient measures 12.1 mmHg. Aortic valve area, by VTI measures 2.49 cm. Pulmonic Valve: The pulmonic valve was not well visualized. Pulmonic valve regurgitation is not visualized. No evidence of pulmonic stenosis. Aorta: The aortic root is normal in size and structure. Venous: The inferior vena cava is dilated in size with less than 50% respiratory variability, suggesting right atrial pressure of 15 mmHg. IAS/Shunts: The interatrial septum was not well visualized.  LEFT VENTRICLE PLAX 2D LVIDd:         3.20 cm   Diastology LVIDs:         1.80 cm   LV e' medial:    5.91 cm/s LV PW:         1.00 cm   LV E/e' medial:  16.1 LV IVS:        0.90 cm   LV e' lateral:   7.15 cm/s LVOT diam:     1.90 cm   LV E/e' lateral: 13.3 LV SV:         82 LV SV Index:   48 LVOT Area:     2.84 cm  RIGHT VENTRICLE RV S prime:     8.83 cm/s TAPSE (M-mode): 1.2 cm LEFT ATRIUM           Index  RIGHT ATRIUM           Index LA diam:      3.30 cm 1.92 cm/m   RA Area:     15.40 cm LA Vol (A4C): 36.9 ml 21.45 ml/m  RA Volume:   41.60 ml  24.18 ml/m  AORTIC VALVE AV Area  (Vmax):    2.17 cm AV Area (Vmean):   2.37 cm AV Area (VTI):     2.49 cm AV Vmax:           173.74 cm/s AV Vmean:          106.109 cm/s AV VTI:            0.329 m AV Peak Grad:      12.1 mmHg AV Mean Grad:      5.4 mmHg LVOT Vmax:         133.00 cm/s LVOT Vmean:        88.600 cm/s LVOT VTI:          0.289 m LVOT/AV VTI ratio: 0.88  AORTA Ao Root diam: 3.80 cm MITRAL VALVE MV Area (PHT): 2.91 cm     SHUNTS MV Decel Time: 261 msec     Systemic VTI:  0.29 m MV E velocity: 95.20 cm/s   Systemic Diam: 1.90 cm MV A velocity: 103.00 cm/s MV E/A ratio:  0.92 Carlyle Dolly MD Electronically signed by Carlyle Dolly MD Signature Date/Time: 02/19/2022/11:58:45 AM    Final    CT Head Wo Contrast  Result Date: 02/15/2022 CLINICAL DATA:  Trauma EXAM: CT HEAD WITHOUT CONTRAST CT CERVICAL SPINE WITHOUT CONTRAST TECHNIQUE: Multidetector CT imaging of the head and cervical spine was performed following the standard protocol without intravenous contrast. Multiplanar CT image reconstructions of the cervical spine were also generated. RADIATION DOSE REDUCTION: This exam was performed according to the departmental dose-optimization program which includes automated exposure control, adjustment of the mA and/or kV according to patient size and/or use of iterative reconstruction technique. COMPARISON:  CT C Spine 08/02/19, CT head 08/12/20 FINDINGS: CT HEAD FINDINGS Brain: No evidence of acute infarction, hemorrhage, hydrocephalus, extra-axial collection or mass lesion/mass effect. Redemonstrated is chronic infarct in the anterior right temporal lobe with mild ex vacuo dilatation of the right temporal horn. 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 Vascular: No hyperdense vessel or unexpected calcification. Skull: Normal. Negative for fracture or focal lesion. Sinuses/Orbits: No middle ear or mastoid effusion. Paranasal sinuses are clear. Orbits are unremarkable. Other: None. CT CERVICAL SPINE  FINDINGS Alignment: Trace anterolisthesis of C3 on C4, C4 on C5. Skull base and vertebrae: No acute fracture. No primary bone lesion or focal pathologic process. Disc space loss at C6-C7 has slightly progressed from prior exam. Soft tissues and spinal canal: No prevertebral fluid or swelling. No visible canal hematoma. Disc levels: No CT evidence of high-grade spinal canal stenosis. There is multilevel severe degenerative facet disease resulting in moderate neural foraminal stenosis at C3-C4 on the right and severe neural foraminal stenosis at C4-C5 bilaterally. Upper chest: See separately dictated CT chest for additional findings. Severe centrilobular emphysema. Other: None IMPRESSION: CT HEAD: 1. No acute intracranial abnormality. 2. 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 CT CERVICAL SPINE: No acute fracture or traumatic malalignment of the cervical spine. Emphysema (ICD10-J43.9). Electronically Signed   By: Marin Roberts M.D.   On: 02/15/2022 14:04   CT Cervical Spine Wo Contrast  Result Date: 02/15/2022 CLINICAL DATA:  Trauma  EXAM: CT HEAD WITHOUT CONTRAST CT CERVICAL SPINE WITHOUT CONTRAST TECHNIQUE: Multidetector CT imaging of the head and cervical spine was performed following the standard protocol without intravenous contrast. Multiplanar CT image reconstructions of the cervical spine were also generated. RADIATION DOSE REDUCTION: This exam was performed according to the departmental dose-optimization program which includes automated exposure control, adjustment of the mA and/or kV according to patient size and/or use of iterative reconstruction technique. COMPARISON:  CT C Spine 08/02/19, CT head 08/12/20 FINDINGS: CT HEAD FINDINGS Brain: No evidence of acute infarction, hemorrhage, hydrocephalus, extra-axial collection or mass lesion/mass effect. Redemonstrated is chronic infarct in the anterior right temporal lobe with mild ex vacuo dilatation of the right temporal  horn. 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 Vascular: No hyperdense vessel or unexpected calcification. Skull: Normal. Negative for fracture or focal lesion. Sinuses/Orbits: No middle ear or mastoid effusion. Paranasal sinuses are clear. Orbits are unremarkable. Other: None. CT CERVICAL SPINE FINDINGS Alignment: Trace anterolisthesis of C3 on C4, C4 on C5. Skull base and vertebrae: No acute fracture. No primary bone lesion or focal pathologic process. Disc space loss at C6-C7 has slightly progressed from prior exam. Soft tissues and spinal canal: No prevertebral fluid or swelling. No visible canal hematoma. Disc levels: No CT evidence of high-grade spinal canal stenosis. There is multilevel severe degenerative facet disease resulting in moderate neural foraminal stenosis at C3-C4 on the right and severe neural foraminal stenosis at C4-C5 bilaterally. Upper chest: See separately dictated CT chest for additional findings. Severe centrilobular emphysema. Other: None IMPRESSION: CT HEAD: 1. No acute intracranial abnormality. 2. 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 CT CERVICAL SPINE: No acute fracture or traumatic malalignment of the cervical spine. Emphysema (ICD10-J43.9). Electronically Signed   By: Marin Roberts M.D.   On: 02/15/2022 14:04   CT Chest Wo Contrast  Result Date: 02/15/2022 CLINICAL DATA:  LEFT chest wall trauma. EXAM: CT CHEST WITHOUT CONTRAST TECHNIQUE: Multidetector CT imaging of the chest was performed following the standard protocol without IV contrast. RADIATION DOSE REDUCTION: This exam was performed according to the departmental dose-optimization program which includes automated exposure control, adjustment of the mA and/or kV according to patient size and/or use of iterative reconstruction technique. COMPARISON:  None Available. FINDINGS: Cardiovascular: No thoracic aortic aneurysm. Aortic atherosclerosis.  Three-vessel coronary artery calcifications, particularly dense within the LEFT anterior descending coronary artery. No clinically significant pericardial effusion. Mediastinum/Nodes: No hemorrhage or edema is seen within the mediastinum. No mass or enlarged lymph nodes are seen within the mediastinum. Esophagus is unremarkable. Trachea is unremarkable. Lungs/Pleura: Paraseptal and centrilobular emphysematous changes bilaterally, upper lobe predominant. Bibasilar consolidations, LEFT greater than RIGHT. Small bilateral pleural effusions. No pneumothorax. Upper Abdomen: Limited images of the upper abdomen are unremarkable. Musculoskeletal: Minimally displaced fractures involving the posterior and posterior-lateral segments of the LEFT eleventh rib. Additional slightly displaced fracture of the posterior-lateral LEFT tenth rib and LEFT lateral ninth rib. Chronic wedge compression deformity of the L1 vertebral body. Degenerative spondylosis of the thoracic spine, mild to moderate in degree. IMPRESSION: 1. Minimally displaced to slightly displaced fractures of the LEFT ninth through eleventh ribs, as detailed above. 2. Bibasilar consolidations, LEFT greater than RIGHT, most likely a combination of atelectasis and pneumonia or aspiration. Favor at least some component of aspiration given the recent trauma. 3. Small bilateral pleural effusions. No pneumothorax. 4. Three-vessel coronary artery calcifications, particularly dense within the LEFT anterior descending coronary artery. Recommend correlation with  any possible associated cardiac symptoms. 5. Chronic wedge compression deformity of the L1 vertebral body. Emphysema (ICD10-J43.9). Electronically Signed   By: Franki Cabot M.D.   On: 02/15/2022 14:03   DG Ankle Complete Right  Result Date: 02/15/2022 CLINICAL DATA:  Right ankle pain, status post fall EXAM: RIGHT ANKLE - COMPLETE 3+ VIEW COMPARISON:  08/11/2020 ankle radiographs FINDINGS: Comminuted, mildly displaced  and impacted fracture of the distal fibula. Comminuted, mildly displaced and distracted fracture through the medial malleolus. Soft tissue swelling about the ankle. IMPRESSION: Comminuted, mildly displaced and impacted fractures through the distal fibula and medial malleolus. Electronically Signed   By: Merilyn Baba M.D.   On: 02/15/2022 13:09    Orson Eva, DO  [facility]  If 7PM-7AM, please contact night-coverage [email or web address] Password [email or web address] 02/20/2022, 9:59 AM   LOS: 5 days

## 2022-02-21 DIAGNOSIS — J9601 Acute respiratory failure with hypoxia: Secondary | ICD-10-CM | POA: Diagnosis not present

## 2022-02-21 DIAGNOSIS — I5031 Acute diastolic (congestive) heart failure: Secondary | ICD-10-CM | POA: Diagnosis not present

## 2022-02-21 DIAGNOSIS — N179 Acute kidney failure, unspecified: Secondary | ICD-10-CM | POA: Diagnosis not present

## 2022-02-21 DIAGNOSIS — J690 Pneumonitis due to inhalation of food and vomit: Secondary | ICD-10-CM | POA: Diagnosis not present

## 2022-02-21 LAB — CULTURE, BLOOD (ROUTINE X 2)
Culture: NO GROWTH
Culture: NO GROWTH
Special Requests: ADEQUATE
Special Requests: ADEQUATE

## 2022-02-21 LAB — COMPREHENSIVE METABOLIC PANEL
ALT: 15 U/L (ref 0–44)
AST: 20 U/L (ref 15–41)
Albumin: 2 g/dL — ABNORMAL LOW (ref 3.5–5.0)
Alkaline Phosphatase: 90 U/L (ref 38–126)
Anion gap: 8 (ref 5–15)
BUN: 19 mg/dL (ref 8–23)
CO2: 35 mmol/L — ABNORMAL HIGH (ref 22–32)
Calcium: 8.9 mg/dL (ref 8.9–10.3)
Chloride: 98 mmol/L (ref 98–111)
Creatinine, Ser: 0.64 mg/dL (ref 0.44–1.00)
GFR, Estimated: >60 mL/min (ref >60)
Glucose, Bld: 112 mg/dL — ABNORMAL HIGH (ref 70–99)
Potassium: 3.8 mmol/L (ref 3.5–5.1)
Sodium: 141 mmol/L (ref 135–145)
Total Bilirubin: 0.4 mg/dL (ref 0.3–1.2)
Total Protein: 5.3 g/dL — ABNORMAL LOW (ref 6.5–8.1)

## 2022-02-21 LAB — LACTATE DEHYDROGENASE: LDH: 160 U/L (ref 98–192)

## 2022-02-21 LAB — MAGNESIUM: Magnesium: 1.9 mg/dL (ref 1.7–2.4)

## 2022-02-21 MED ORDER — POTASSIUM CHLORIDE CRYS ER 20 MEQ PO TBCR
20.0000 meq | EXTENDED_RELEASE_TABLET | Freq: Every day | ORAL | Status: DC
  Administered 2022-02-21 – 2022-02-24 (×4): 20 meq via ORAL
  Filled 2022-02-21 (×4): qty 1

## 2022-02-21 MED ORDER — IPRATROPIUM-ALBUTEROL 0.5-2.5 (3) MG/3ML IN SOLN
3.0000 mL | Freq: Three times a day (TID) | RESPIRATORY_TRACT | Status: DC
  Administered 2022-02-22 – 2022-02-24 (×8): 3 mL via RESPIRATORY_TRACT
  Filled 2022-02-21 (×8): qty 3

## 2022-02-21 MED ORDER — MAGNESIUM OXIDE -MG SUPPLEMENT 400 (240 MG) MG PO TABS
400.0000 mg | ORAL_TABLET | Freq: Every day | ORAL | Status: DC
  Administered 2022-02-21: 400 mg via ORAL
  Filled 2022-02-21 (×2): qty 1

## 2022-02-21 MED ORDER — PNEUMOCOCCAL 20-VAL CONJ VACC 0.5 ML IM SUSY: 0.5000 mL | PREFILLED_SYRINGE | INTRAMUSCULAR | Status: DC

## 2022-02-21 MED ORDER — FUROSEMIDE 10 MG/ML IJ SOLN
40.0000 mg | Freq: Two times a day (BID) | INTRAMUSCULAR | Status: DC
  Administered 2022-02-21 – 2022-02-23 (×5): 40 mg via INTRAVENOUS
  Filled 2022-02-21 (×5): qty 4

## 2022-02-21 NOTE — Progress Notes (Addendum)
PROGRESS NOTE  Cassidy Smith PJK:[id number] DOB: [birth year] DOA: 02/15/2022 PCP: Neale Burly, MD  Brief History:  67 year old female with history of depression, anxiety, bipolar 1 disorder, chronic DVT, chronic hip pain, dementia, history of UTIs and hypothyroidism, previous intracerebral hematoma, history of hemorrhagic CVA, SIADH, DNR present on admission, history of rhabdomyolysis who apparently has been living alone but having care aides that are with her during the daytime however she remains alone at night.  Historian reported that patient has been having frequent falls recently at home and gait instability which is worsened over the past 3 to 4 days.  She also is having intermittent confusion which is worse from her baseline.  She has had poor oral intake over the past several days according to caretakers.  Patient denies having fever and chills.  Patient denies chest pain and reports having cough and chest congestion.  Patient was found down this morning by her care aides.  She apparently had fallen sometime in the middle of the night.  She had noticeable bruising and swelling to her right ankle and she hit her head.  She has bruising on her left rib cage.  Patient normally ambulates with a walker.  Family reports that patient has not been at her baseline mentation for the past several days.  Workup in the emergency department reveals that patient has severe acute kidney injury, she has a fractured right ankle and multiple rib fractures.  She also has evidence of an aspiration pneumonitis and she was started on IV fluids and IV antibiotics.  Admission was requested for further management.     Assessment/Plan:  AKI- treated and resolved  Prerenal secondary to severe dehydration Initially fluid resuscitated Renally dose medications Follow daily basic metabolic panel Creatinine improved to 2.27 >> 0.64   Aspiration pneumonia Had 5 days ceftriaxone and azithro--finished  2/6 Dysphagia diet ordered with aspiration precautions Supplemental oxygen as needed SLP eval requested    Acute respiratory distress /Acute respiratory failure with hypoxia - stop IV fluids  - start lasix IV--increase to BID - unable to obtain ReDS due to poor cooperation from patient - personally reviewed CXR--pulm edema, bilateral pleural effusion L>R - 2/7 Left side thora--800cc removed>>transudate   Acute HFpEF -2/6 echo--EF 70-75%, mid-mod TR, small pericardial eff -start daily IV lasix--increase to bid -accurate I/O -daily weights - 2/7 Left side thora--800cc removed>>transudate   Dysphagia (severe) NPO initially SLP now recommending Dys 1 diet with NTL Aspiration precautions   Leukocytosis - resolved  Secondary to pneumonia and aspiration Resolved    Right ankle fracture Discussed with orthopedist Dr. Amedeo Kinsman who recommends splinting and outpatient follow-up with orthopedics Pain management as ordered NWB to RLE until ok with orthopedics Appreciate ortho consult, she will remain NWB and outpatient follow up in 2 weeks Initially refused SNF>>now agreeable   Falls Frequently/High Fall Risk Fall precautions recommended PT evaluation requested and recommending SNF TOC consulted for SNF placement  Multiple discussions with son and he is adamant he wants pt at his home with [facility] services and he has arranged for 24/7 supervision     Bipolar 1 disorder Resumed home behavioral health medications Delirium precautions recommended   Tobacco user/COPD Nicotine patch ordered Cessation discussed -nearly 100 pack year history -start duoneb and pulmicort   Hyperthyroidism Resumed home methimazole    DNR present on admission Advance care planning documents have been scanned into the EMR and reviewed   Essential hypertension  Diet controlled, following           Family Communication:   son updated 2/8   Consultants:  none   Code Status:  DNR   DVT Prophylaxis:   [place] Heparin     Procedures: As Listed in Progress Note Above   Antibiotics: Ceftriaxone 2/2>>2/6 Azithro 2/2>>2/6          Subjective: Pt more alert and conversant.  Complain of some sob, better.  Denies f/c, cp, n/v/d  Objective: Vitals:   02/21/22 0420 02/21/22 0500 02/21/22 0751 02/21/22 0753  BP: (!) 107/54     Pulse: 73     Resp: 18     Temp: 98 F (36.7 C)     TempSrc:      SpO2: 96%  91% 94%  Weight:  64.8 kg    Height:        Intake/Output Summary (Last 24 hours) at 02/21/2022 0919 Last data filed at 02/21/2022 0534 Gross per 24 hour  Intake 480 ml  Output 1000 ml  Net -520 ml   Weight change: -0.4 kg Exam:  General:  Pt is alert, follows commands appropriately, not in acute distress HEENT: No icterus, No thrush, No neck mass, [place]/AT Cardiovascular: RRR, S1/S2, no rubs, no gallops Respiratory: bibasilar rales.  Mild bibasilar wheeze Abdomen: Soft/+BS, non tender, non distended, no guarding Extremities: trace LLE edema, No lymphangitis, No petechiae, No rashes, no synovitis;  RLE in splint   Data Reviewed: I have personally reviewed following labs and imaging studies Basic Metabolic Panel: Recent Labs  Lab 02/16/22 0446 02/17/22 0348 02/18/22 0424 02/18/22 0857 02/19/22 0404 02/20/22 1018 02/21/22 0405  NA 139 139  --  139 140 143 141  K 4.5 4.0  --  4.3 4.4 3.8 3.8  CL 99 101  --  103 102 100 98  CO2 30 31  --  24 34* 36* 35*  GLUCOSE 96 93 92 88 126* 108* 112*  BUN 44* 36*  --  29* 27* 20 19  CREATININE 2.27* 1.28*  --  0.84 0.80 0.70 0.64  CALCIUM 8.9 8.8*  --  9.0 8.9 9.4 8.9  MG 1.8 1.7 1.7  --  1.9 1.5* 1.9  PHOS 3.4  --   --   --   --   --   --    Liver Function Tests: Recent Labs  Lab 02/21/22 0405  AST 20  ALT 15  ALKPHOS 90  BILITOT 0.4  PROT 5.3*  ALBUMIN 2.0*   No results for input(s): "LIPASE", "AMYLASE" in the last 168 hours. No results for input(s): "AMMONIA" in the last 168 hours. Coagulation Profile: No  results for input(s): "INR", "PROTIME" in the last 168 hours. CBC: Recent Labs  Lab 02/15/22 1244 02/15/22 1532 02/16/22 0446 02/17/22 0348 02/20/22 1018  WBC 10.7* 9.6 8.8 7.5 7.9  NEUTROABS  --   --  6.5 3.7  --   HGB 11.1* 10.0* 10.1* 9.2* 9.0*  HCT 34.7* 31.6* 31.8* 28.8* 26.4*  MCV 95.6 95.8 95.5 94.7 103.5*  PLT 115* 110* 108* 90* 52*   Cardiac Enzymes: Recent Labs  Lab 02/15/22 1244  CKTOTAL 114   BNP: Invalid input(s): "POCBNP" CBG: No results for input(s): "GLUCAP" in the last 168 hours. HbA1C: No results for input(s): "HGBA1C" in the last 72 hours. Urine analysis:    Component Value Date/Time   COLORURINE YELLOW 08/12/2020 1339   APPEARANCEUR CLEAR 08/12/2020 1339   LABSPEC 1.015 08/12/2020 1339   PHURINE  8.0 08/12/2020 1339   GLUCOSEU NEGATIVE 08/12/2020 1339   HGBUR NEGATIVE 08/12/2020 1339   BILIRUBINUR NEGATIVE 08/12/2020 1339   KETONESUR 5 (A) 08/12/2020 1339   PROTEINUR NEGATIVE 08/12/2020 1339   UROBILINOGEN 0.2 05/12/2013 0210   NITRITE NEGATIVE 08/12/2020 1339   LEUKOCYTESUR NEGATIVE 08/12/2020 1339   Sepsis Labs: '@LABRCNTIP'$ (procalcitonin:4,lacticidven:4) ) Recent Results (from the past 240 hour(s))  Blood culture (routine x 2)     Status: None   Collection Time: 02/15/22  3:32 PM   Specimen: BLOOD  Result Value Ref Range Status   Specimen Description BLOOD BLOOD RIGHT ARM  Final   Special Requests Blood Culture adequate volume  Final   Culture   Final    NO GROWTH 6 DAYS Performed at [facility], [address] [place], [place] [address]    Report Status 02/21/2022 FINAL  Final  Blood culture (routine x 2)     Status: None   Collection Time: 02/15/22  3:32 PM   Specimen: BLOOD  Result Value Ref Range Status   Specimen Description BLOOD BLOOD RIGHT HAND AEROBIC BOTTLE ONLY  Final   Special Requests Blood Culture adequate volume  Final   Culture   Final    NO GROWTH 6 DAYS Performed at [facility], [address], [place],  [place] [address]    Report Status 02/21/2022 FINAL  Final  Resp panel by RT-PCR (RSV, Flu A&B, Covid) Anterior Nasal Swab     Status: None   Collection Time: 02/20/22  9:45 AM   Specimen: Anterior Nasal Swab  Result Value Ref Range Status   SARS Coronavirus 2 by RT PCR NEGATIVE NEGATIVE Final    Comment: (NOTE) SARS-CoV-2 target nucleic acids are NOT DETECTED.  The SARS-CoV-2 RNA is generally detectable in upper respiratory specimens during the acute phase of infection. The lowest concentration of SARS-CoV-2 viral copies this assay can detect is 138 copies/mL. A negative result does not preclude SARS-Cov-2 infection and should not be used as the sole basis for treatment or other patient management decisions. A negative result may occur with  improper specimen collection/handling, submission of specimen other than nasopharyngeal swab, presence of viral mutation(s) within the areas targeted by this assay, and inadequate number of viral copies(<138 copies/mL). A negative result must be combined with clinical observations, patient history, and epidemiological information. The expected result is Negative.  Fact Sheet for Patients:  EntrepreneurPulse.com.au  Fact Sheet for Healthcare Providers:  IncredibleEmployment.be  This test is no t yet approved or cleared by the [place] FDA and  has been authorized for detection and/or diagnosis of SARS-CoV-2 by FDA under an Emergency Use Authorization (EUA). This EUA will remain  in effect (meaning this test can be used) for the duration of the COVID-19 declaration under Section 564(b)(1) of the Act, 21 U.S.C.section 360bbb-3(b)(1), unless the authorization is terminated  or revoked sooner.       Influenza A by PCR NEGATIVE NEGATIVE Final   Influenza B by PCR NEGATIVE NEGATIVE Final    Comment: (NOTE) The Xpert Xpress SARS-CoV-2/FLU/RSV plus assay is intended as an aid in the diagnosis of influenza from  Nasopharyngeal swab specimens and should not be used as a sole basis for treatment. Nasal washings and aspirates are unacceptable for Xpert Xpress SARS-CoV-2/FLU/RSV testing.  Fact Sheet for Patients: EntrepreneurPulse.com.au  Fact Sheet for Healthcare Providers: IncredibleEmployment.be  This test is not yet approved or cleared by the [place] FDA and has been authorized for detection and/or diagnosis of SARS-CoV-2 by  FDA under an Emergency Use Authorization (EUA). This EUA will remain in effect (meaning this test can be used) for the duration of the COVID-19 declaration under Section 564(b)(1) of the Act, 21 U.S.C. section 360bbb-3(b)(1), unless the authorization is terminated or revoked.     Resp Syncytial Virus by PCR NEGATIVE NEGATIVE Final    Comment: (NOTE) Fact Sheet for Patients: EntrepreneurPulse.com.au  Fact Sheet for Healthcare Providers: IncredibleEmployment.be  This test is not yet approved or cleared by the [place] FDA and has been authorized for detection and/or diagnosis of SARS-CoV-2 by FDA under an Emergency Use Authorization (EUA). This EUA will remain in effect (meaning this test can be used) for the duration of the COVID-19 declaration under Section 564(b)(1) of the Act, 21 U.S.C. section 360bbb-3(b)(1), unless the authorization is terminated or revoked.  Performed at [facility] [address] [place], [place] [address]   Gram stain     Status: None   Collection Time: 02/20/22 11:30 AM   Specimen: Pleura  Result Value Ref Range Status   Specimen Description PLEURAL  Final   Special Requests LEFT CHEST  Final   Gram Stain   Final    NO ORGANISMS SEEN WBC PRESENT, PREDOMINANTLY MONONUCLEAR CYTOSPIN SMEAR Performed at [facility], [address], [place], [place] [address]    Report Status 02/20/2022 FINAL  Final  Culture, body fluid w Gram Stain-bottle     Status:  None (Preliminary result)   Collection Time: 02/20/22 11:30 AM   Specimen: Pleura  Result Value Ref Range Status   Specimen Description PLEURAL LEFT CHEST  Final   Special Requests BOTTLES DRAWN AEROBIC AND ANAEROBIC 10CC  Final   Culture   Final    NO GROWTH < 24 HOURS Performed at [facility], [address] [place], [place] [address]    Report Status PENDING  Incomplete     Scheduled Meds:  acetaminophen  650 mg Oral Q6H   Or   acetaminophen  650 mg Rectal Q6H   aspirin EC  81 mg Oral Q breakfast   atorvastatin  20 mg Oral q1800   benztropine  1 mg Oral BID   budesonide (PULMICORT) nebulizer solution  0.5 mg Nebulization BID   cyanocobalamin  1,000 mcg Oral Daily   divalproex  1,000 mg Oral QHS   feeding supplement  237 mL Oral BID BM   furosemide  40 mg Intravenous BID   gabapentin  100 mg Oral QHS   ipratropium-albuterol  3 mL Nebulization Q6H   lidocaine  1 patch Transdermal Q24H   magnesium oxide  400 mg Oral Daily   methimazole  10 mg Oral QODAY   nicotine  21 mg Transdermal Daily   pantoprazole  40 mg Oral QHS   traZODone  100 mg Oral QHS   Continuous Infusions:  Procedures/Studies: US THORACENTESIS ASP PLEURAL SPACE W/IMG GUIDE  Result Date: 02/20/2022 INDICATION: Dyspnea, pleural effusion after fall with rib fractures EXAM: ULTRASOUND GUIDED LEFT THORACENTESIS MEDICATIONS: None. COMPLICATIONS: None immediate. PROCEDURE: An ultrasound guided thoracentesis was thoroughly discussed with the patient and questions answered. The benefits, risks, alternatives and complications were also discussed. The patient understands and wishes to proceed with the procedure. Written consent was obtained. Ultrasound was performed to localize and mark an adequate pocket of fluid in the left chest. The area was then prepped and draped in the normal sterile fashion. 1% Lidocaine was used for local anesthesia. Under ultrasound guidance a 8Fr catheter was introduced. Thoracentesis was  performed.  The catheter was removed and a dressing applied. FINDINGS: A total of approximately 800cc of bloody fluid was removed. Samples were sent to the laboratory as requested by the clinical team. IMPRESSION: Successful ultrasound guided left thoracentesis yielding 800cc of pleural fluid. Performed and dictated by Pasty Spillers, PA-C Electronically Signed   By: Lavonia Dana M.D.   On: 02/20/2022 12:35   DG Chest 1 View  Result Date: 02/20/2022 CLINICAL DATA:  [id number] Status post thoracentesis [id number] EXAM: CHEST  1 VIEW COMPARISON:  Same day chest x-ray FINDINGS: Stable cardiomediastinal contours. Near-complete resolution of previously seen left pleural effusion with improved aeration of the left lung base. Persistent small right pleural effusion. No pneumothorax. IMPRESSION: Near-complete resolution of previously seen left pleural effusion with improved aeration of the left lung base. No pneumothorax. Electronically Signed   By: Davina Poke D.O.   On: 02/20/2022 12:06   DG CHEST PORT 1 VIEW  Result Date: 02/20/2022 CLINICAL DATA:  Respiratory distress EXAM: PORTABLE CHEST 1 VIEW COMPARISON:  02/19/2022 FINDINGS: Stable cardiomediastinal contours. Aortic atherosclerosis. Pulmonary vascular congestion with diffusely prominent interstitial markings bilaterally. Persistent small right pleural effusion. Moderate-large left-sided pleural effusion has increased from prior with worsening aeration at the left lung base. No pneumothorax. IMPRESSION: 1. Moderate-large left-sided pleural effusion has increased from prior with worsening aeration at the left lung base. 2. Persistent small right pleural effusion. Electronically Signed   By: Davina Poke D.O.   On: 02/20/2022 09:50   DG Swallowing Func-Speech Pathology  Result Date: 02/19/2022 Table formatting from the original result was not included. Modified Barium Swallow Study Patient Details Name: ELOISA CHOKSHI MRN: [id number] Date of Birth: 02/10/1955  Today's Date: 02/19/2022 HPI/PMH: HPI: 67 year old female with history of depression, anxiety, bipolar 1 disorder, chronic DVT, chronic hip pain, dementia, history of UTIs and hypothyroidism, previous intracerebral hematoma, history of hemorrhagic CVA, SIADH, DNR present on admission, history of rhabdomyolysis who apparently has been living alone but having care aides that are with her during the daytime however she remains alone at night. Historian reported that patient has been having frequent falls recently at home and gait instability which is worsened over the past 3 to 4 days.  She also is having intermittent confusion which is worse from her baseline.  She has had poor oral intake over the past several days according to caretakers. Pt found down on the floor at home by caregivers. Workup in the emergency department reveals that patient has severe acute kidney injury, she has a fractured right ankle and multiple rib fractures.  She also has evidence of an aspiration pneumonitis and she was started on IV fluids and IV antibiotics. BSE requested. Clinical Impression: Clinical Impression: Pt presents with mild/moderate oropharyngeal dysphagia that is negatively impacted by Tardive Dyskinesia (TD) which causes repetitive, writhing movements of her jaw and tongue. Pt's decreased oral coordination and bolus cohesion result in poor oral containment anteriorly and posteriorly; swallowing is triggered at the level of the pyriforms, trace to moderate pharyngeal residue of thin is aspirated in trace amounts; aspiration is sensed most of the time but aspirates were not expelled from the airway. Note aspiration does not occur with every thin presentation (not noted with very small controlled cup sips). Pt is physically unable to maintain chin tuck or consistently utilize any attempted compensatory strategies secondary to her TD. Despite decreased coordination and slow swallowing trigger at the pyriforms NTL, puree and  regular are within functional limits with only trace residue on oral and  pharyngeal structures with no penetration or aspiration visualized. Pt is at high risk for aspiration with thin liquids and suspect Pt has been compensating for her TD, however her current deconditioned Pt is frequently aspirating thin liquids in trace amounts. Recommend continue with D1/puree and downgrade Pt's liquids to NTL. Pt is a good candidate for free water protocol and recommend dysphagia therapy with repeat MBSS when clinically indicated. Factors that may increase risk of adverse event in presence of aspiration ([place] 2021): Weak cough; Dependence for feeding and/or oral hygiene Recommendations/Plan: Swallowing Evaluation Recommendations Swallowing Evaluation Recommendations Recommendations: PO diet PO Diet Recommendation: Dysphagia 1 (Pureed); Mildly thick liquids (Level 2, nectar thick) Liquid Administration via: Cup; Straw Medication Administration: Crushed with puree Supervision: Full assist for feeding Swallowing strategies  : Slow rate; Small bites/sips; Follow solids with liquids; Clear throat intermittently Oral care recommendations: Oral care BID (2x/day); Oral care before ice chips/water Caregiver Recommendations: Avoid jello, ice cream, thin soups, popsicles; Remove water pitcher Treatment Plan Treatment Plan Follow-up recommendations: Skilled nursing-short term rehab (<3 hours/day) Functional status assessment: Patient has had a recent decline in their functional status and demonstrates the ability to make significant improvements in function in a reasonable and predictable amount of time. Treatment frequency: Min 2x/week Treatment duration: 2 weeks Interventions: Aspiration precaution training; Oropharyngeal exercises; Compensatory techniques; Patient/family education; Trials of upgraded texture/liquids; Diet toleration management by SLP; Respiratory muscle strength training Recommendations Recommendations for  follow up therapy are one component of a multi-disciplinary discharge planning process, led by the attending physician.  Recommendations may be updated based on patient status, additional functional criteria and insurance authorization. Assessment: Orofacial Exam: Orofacial Exam Oral Cavity: Oral Hygiene: Xerostomia Oral Cavity - Dentition: Edentulous Orofacial Anatomy: WFL Oral Motor/Sensory Function: WFL Anatomy: Anatomy: WFL Thin Liquids: Thin Liquids (Level 0) Thin Liquids : Impaired Bolus delivery method: Spoon; Cup Thin Liquid - Impairment: Oral Impairment; Pharyngeal impairment Lip Closure: Central anterior loss Tongue control during bolus hold: Escape to lateral buccal cavity/floor of mouth; Posterior escape of less than half of bolus Bolus transport/lingual motion: Delayed initiation of tongue motion (oral holding); Repetitive/disorganized tongue motion Oral residue: Trace residue lining oral structures Location of oral residue : Tongue; Floor of mouth; Palate Initiation of swallow : Pyriform sinuses Soft palate elevation: Complete Tongue base retraction: Trace column of contrast or air between tongue base and PPW Laryngeal elevation: Partial or minimal superior movement and approximation Anterior hyoid excursion: Partial Epiglottic movement: Complete Laryngeal vestibule closure: Incomplete Pharyngeal stripping wave : Present - diminished Pharyngeal contraction (A/P view only): N/A Pharyngoesophageal segment opening: Complete distension and complete duration, no obstruction of flow Pharyngeal residue: Collection of residue within or on pharyngeal structures Location of pharyngeal residue: Valleculae; Pharyngeal wall; Pyriform sinuses; Diffuse (>3 areas) Penetration/Aspiration Scale (PAS) score: 7.  Material enters airway, passes BELOW cords and not ejected out despite cough attempt by patient  Mildly Thick Liquids: Mildly thick liquids (Level 2, nectar thick) Mildly thick liquids (Level 2, nectar thick):  Impaired Bolus delivery method: Cup; Spoon Mildly Thick Liquid - Impairment: Oral Impairment; Pharyngeal impairment Lip Closure: Central anterior loss Tongue control during bolus hold: Escape to lateral buccal cavity/floor of mouth; Posterior escape of less than half of bolus Bolus transport/lingual motion: Repetitive/disorganized tongue motion; Slow tongue motion Oral residue: Trace residue lining oral structures; Residue collection on oral structures Location of oral residue : Floor of mouth; Tongue; Palate Initiation of swallow : Pyriform sinuses Soft palate elevation: Complete Tongue base retraction: Trace column  of contrast or air between tongue base and PPW; Narrow column of contrast or air between tongue base and PPW Laryngeal elevation: Complete superior movement of thyroid cartilage with complete approximation of arytenoids to epiglottic petiole Anterior hyoid excursion: Complete Epiglottic movement: Complete Laryngeal vestibule closure: Complete: No air/contrast in laryngeal vestibule Pharyngeal stripping wave : Present - diminished Pharyngeal contraction (A/P view only): N/A Pharyngoesophageal segment opening: Complete distension and complete duration, no obstruction of flow Pharyngeal residue: Collection of residue within or on pharyngeal structures Location of pharyngeal residue: Valleculae; Pharyngeal wall; Pyriform sinuses; Diffuse (>3 areas); Tongue base; Aryepiglottic folds Penetration/Aspiration Scale (PAS) score: 1.  Material does not enter airway  Moderately Thick Liquids: Moderately thick liquids (Level 3, honey thick) Moderately thick liquids (Level 3, honey thick): WFL  Puree: Puree Puree: WFL Solid: Solid Solid: WFL Pill: Pill Pill: Not Tested Compensatory Strategies: Compensatory Strategies Compensatory strategies: Yes Chin tuck: Ineffective   General Information: Caregiver present: No  Diet Prior to this Study: Dysphagia 1 (pureed); Thin liquids (Level 0)   Temperature : Normal    Respiratory Status: WFL   Supplemental O2: None (Room air)   History of Recent Intubation: No  Behavior/Cognition: Alert; Cooperative; Pleasant mood Self-Feeding Abilities: Dependent for feeding Baseline vocal quality/speech: Normal Volitional Cough: Able to elicit Volitional Swallow: Unable to elicit No data recorded Goal Planning: Prognosis for improved oropharyngeal function: Fair Barriers to Reach Goals: Severity of deficits; Overall medical prognosis No data recorded Patient/Family Stated Goal: N/A Consulted and agree with results and recommendations: Patient; Nurse Pain: Pain Assessment Pain Assessment: 0-10 Pain Score: 10 Pain Location: Pointed to RLE Pain Descriptors / Indicators: Aching Pain Intervention(s): Limited activity within patient's tolerance End of Session: Start Time:SLP Start Time (ACUTE ONLY): 1430 Stop Time: SLP Stop Time (ACUTE ONLY): 1502 Time Calculation:SLP Time Calculation (min) (ACUTE ONLY): 32 min Charges: SLP Evaluations $ SLP Speech Visit: 1 Visit SLP Evaluations $BSS Swallow: 1 Procedure $MBS Swallow: 1 Procedure SLP visit diagnosis: SLP Visit Diagnosis: Dysphagia, unspecified (R13.10) Past Medical History: Past Medical History: Diagnosis Date  Anxiety   Bipolar 1 disorder (HCC)   Chronic deep vein thrombosis (DVT) of distal vein of lower extremity ([place]) 07/10/2017  Chronic hip pain   Depression   GI bleed   Headache(784.0)   History of UTI/bladder spams.   Hyperthyroidism   Hypokalemia 08/15/2017  Intracerebral hematoma ([place]) 07/04/2019  Ischemic stroke (HCC)   Neuromuscular disorder (HCC)   shaking of hands   Pneumonia of right lower lobe due to infectious organism 11/06/2019  Radial styloid fracture: right 03/06/2017  Rhabdomyolysis 06/07/2015  SIADH (syndrome of inappropriate ADH production) ([place]) 04/08/2017  Thyrotoxicosis, unspecified with thyrotoxic crisis or storm 01/15/2019 Past Surgical History: Past Surgical History: Procedure Laterality Date  COLONOSCOPY WITH PROPOFOL N/A  04/05/2017  Procedure: COLONOSCOPY WITH PROPOFOL;  Surgeon: Rogene Houston, MD;  [facility];  Service: Endoscopy;  Laterality: N/A;  INTRAMEDULLARY (IM) NAIL INTERTROCHANTERIC Right 03/07/2017  Procedure: OPEN TREATMENT INTERNAL FIXATION RIGHT HIP WITH GAMA INTRAMEDULARY NAIL;  Surgeon: Carole Civil, MD;  Location: [facility];  Service: Orthopedics;  Laterality: Right;  MULTIPLE EXTRACTIONS WITH ALVEOLOPLASTY N/A 10/30/2012  Procedure: MULTIPLE EXTRACION 5, 6, 8, 9, 10 ,18, 19, 31 WITH MAXILLARY RIGHT AND LEFT  ALVEOLOPLASTY REDUCE MAXILLARY LEFT TUBEROSITY;  Surgeon: Gae Bon, DDS;  Location: [facility];  Service: Oral Surgery;  Laterality: N/A;  POLYPECTOMY  04/05/2017  Procedure: POLYPECTOMY;  Surgeon: Rogene Houston, MD;  [facility];  Service: Endoscopy;;  recto-sigmoid, rectum Amelia H. Roddie Mc, CCC-SLP Speech Language Pathologist Wende Bushy 02/19/2022, 6:05 PM  DG CHEST PORT 1 VIEW  Result Date: 02/19/2022 CLINICAL DATA:  Acute respiratory distress EXAM: PORTABLE CHEST 1 VIEW COMPARISON:  08/12/2020 FINDINGS: Cardiomegaly. Aortic atherosclerosis. Interstitial pulmonary edema. Bilateral effusions with dependent atelectasis, left more than right. Findings most consistent with congestive heart failure. Coexistent pneumonia not excluded by radiography however. IMPRESSION: Congestive heart failure pattern with interstitial edema, bilateral effusions and dependent atelectasis, left more than right. Cannot exclude coexistent pneumonia. Electronically Signed   By: Nelson Chimes M.D.   On: 02/19/2022 16:29   ECHOCARDIOGRAM COMPLETE  Result Date: 02/19/2022    ECHOCARDIOGRAM REPORT   Patient Name:   KAMICA FLORANCE Date of Exam: 02/19/2022 Medical Rec #:  [id number]       Height:       65.0 in Accession #:    [id number]      Weight:       144.0 lb Date of Birth:  November 01, 1955       BSA:          1.720 m Patient Age:    51 years        BP:           131/92 mmHg Patient Gender: F                HR:           90 bpm. Exam Location:  [facility] Procedure: 2D Echo, Cardiac Doppler and Color Doppler Indications:    CAD Native Vessel I25.10                 Dyspnea R06.00  History:        Patient has no prior history of Echocardiogram examinations.                 Stroke, Signs/Symptoms:Altered Mental Status; Risk                 Factors:Current Smoker.  Sonographer:    Greer Pickerel Referring Phys: [id number] [address] Iba  Sonographer Comments: Image acquisition challenging due to patient body habitus and Image acquisition challenging due to respiratory motion. IMPRESSIONS  1. Left ventricular ejection fraction, by estimation, is 70 to 75%. The left ventricle has hyperdynamic function. Left ventricular endocardial border not optimally defined to evaluate regional wall motion. Left ventricular diastolic parameters are indeterminate. Elevated left atrial pressure.  2. Right ventricular systolic function was not well visualized. The right ventricular size is not well visualized.  3. A small pericardial effusion is present. The pericardial effusion is circumferential. There is no evidence of cardiac tamponade.  4. The mitral valve is normal in structure. No evidence of mitral valve regurgitation. No evidence of mitral stenosis.  5. Tricuspid valve regurgitation is mild to moderate.  6. The aortic valve is tricuspid. There is mild calcification of the aortic valve. There is mild thickening of the aortic valve. Aortic valve regurgitation is not visualized. No aortic stenosis is present.  7. The inferior vena cava is dilated in size with <50% respiratory variability, suggesting right atrial pressure of 15 mmHg. FINDINGS  Left Ventricle: Left ventricular ejection fraction, by estimation, is 70 to 75%. The left ventricle has hyperdynamic function. Left ventricular endocardial border not optimally defined to evaluate regional wall motion. The left ventricular internal cavity size was normal in size. There is  no left ventricular hypertrophy. Left ventricular diastolic parameters are indeterminate.  Elevated left atrial pressure. Right Ventricle: The right ventricular size is not well visualized. Right vetricular wall thickness was not well visualized. Right ventricular systolic function was not well visualized. Left Atrium: Left atrial size was not well visualized. Right Atrium: Right atrial size was not well visualized. Pericardium: A small pericardial effusion is present. The pericardial effusion is circumferential. There is no evidence of cardiac tamponade. Mitral Valve: The mitral valve is normal in structure. No evidence of mitral valve regurgitation. No evidence of mitral valve stenosis. Tricuspid Valve: The tricuspid valve is not well visualized. Tricuspid valve regurgitation is mild to moderate. Aortic Valve: The aortic valve is tricuspid. There is mild calcification of the aortic valve. There is mild thickening of the aortic valve. There is mild aortic valve annular calcification. Aortic valve regurgitation is not visualized. No aortic stenosis  is present. Aortic valve mean gradient measures 5.4 mmHg. Aortic valve peak gradient measures 12.1 mmHg. Aortic valve area, by VTI measures 2.49 cm. Pulmonic Valve: The pulmonic valve was not well visualized. Pulmonic valve regurgitation is not visualized. No evidence of pulmonic stenosis. Aorta: The aortic root is normal in size and structure. Venous: The inferior vena cava is dilated in size with less than 50% respiratory variability, suggesting right atrial pressure of 15 mmHg. IAS/Shunts: The interatrial septum was not well visualized.  LEFT VENTRICLE PLAX 2D LVIDd:         3.20 cm   Diastology LVIDs:         1.80 cm   LV e' medial:    5.91 cm/s LV PW:         1.00 cm   LV E/e' medial:  16.1 LV IVS:        0.90 cm   LV e' lateral:   7.15 cm/s LVOT diam:     1.90 cm   LV E/e' lateral: 13.3 LV SV:         82 LV SV Index:   48 LVOT Area:     2.84 cm  RIGHT VENTRICLE RV  S prime:     8.83 cm/s TAPSE (M-mode): 1.2 cm LEFT ATRIUM           Index        RIGHT ATRIUM           Index LA diam:      3.30 cm 1.92 cm/m   RA Area:     15.40 cm LA Vol (A4C): 36.9 ml 21.45 ml/m  RA Volume:   41.60 ml  24.18 ml/m  AORTIC VALVE AV Area (Vmax):    2.17 cm AV Area (Vmean):   2.37 cm AV Area (VTI):     2.49 cm AV Vmax:           173.74 cm/s AV Vmean:          106.109 cm/s AV VTI:            0.329 m AV Peak Grad:      12.1 mmHg AV Mean Grad:      5.4 mmHg LVOT Vmax:         133.00 cm/s LVOT Vmean:        88.600 cm/s LVOT VTI:          0.289 m LVOT/AV VTI ratio: 0.88  AORTA Ao Root diam: 3.80 cm MITRAL VALVE MV Area (PHT): 2.91 cm     SHUNTS MV Decel Time: 261 msec     Systemic VTI:  0.29 m MV E velocity: 95.20  cm/s   Systemic Diam: 1.90 cm MV A velocity: 103.00 cm/s MV E/A ratio:  0.92 Carlyle Dolly MD Electronically signed by Carlyle Dolly MD Signature Date/Time: 02/19/2022/11:58:45 AM    Final    CT Head Wo Contrast  Result Date: 02/15/2022 CLINICAL DATA:  Trauma EXAM: CT HEAD WITHOUT CONTRAST CT CERVICAL SPINE WITHOUT CONTRAST TECHNIQUE: Multidetector CT imaging of the head and cervical spine was performed following the standard protocol without intravenous contrast. Multiplanar CT image reconstructions of the cervical spine were also generated. RADIATION DOSE REDUCTION: This exam was performed according to the departmental dose-optimization program which includes automated exposure control, adjustment of the mA and/or kV according to patient size and/or use of iterative reconstruction technique. COMPARISON:  CT C Spine 08/02/19, CT head 08/12/20 FINDINGS: CT HEAD FINDINGS Brain: No evidence of acute infarction, hemorrhage, hydrocephalus, extra-axial collection or mass lesion/mass effect. Redemonstrated is chronic infarct in the anterior right temporal lobe with mild ex vacuo dilatation of the right temporal horn. Redemonstrated is a chronic right cerebellar infarct, unchanged from  prior exam. Vascular: No hyperdense vessel or unexpected calcification. Skull: Normal. Negative for fracture or focal lesion. Sinuses/Orbits: No middle ear or mastoid effusion. Paranasal sinuses are clear. Orbits are unremarkable. Other: None. CT CERVICAL SPINE FINDINGS Alignment: Trace anterolisthesis of C3 on C4, C4 on C5. Skull base and vertebrae: No acute fracture. No primary bone lesion or focal pathologic process. Disc space loss at C6-C7 has slightly progressed from prior exam. Soft tissues and spinal canal: No prevertebral fluid or swelling. No visible canal hematoma. Disc levels: No CT evidence of high-grade spinal canal stenosis. There is multilevel severe degenerative facet disease resulting in moderate neural foraminal stenosis at C3-C4 on the right and severe neural foraminal stenosis at C4-C5 bilaterally. Upper chest: See separately dictated CT chest for additional findings. Severe centrilobular emphysema. Other: None IMPRESSION: CT HEAD: 1. No acute intracranial abnormality. 2. 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 CT CERVICAL SPINE: No acute fracture or traumatic malalignment of the cervical spine. Emphysema (ICD10-J43.9). Electronically Signed   By: Marin Roberts M.D.   On: 02/15/2022 14:04   CT Cervical Spine Wo Contrast  Result Date: 02/15/2022 CLINICAL DATA:  Trauma EXAM: CT HEAD WITHOUT CONTRAST CT CERVICAL SPINE WITHOUT CONTRAST TECHNIQUE: Multidetector CT imaging of the head and cervical spine was performed following the standard protocol without intravenous contrast. Multiplanar CT image reconstructions of the cervical spine were also generated. RADIATION DOSE REDUCTION: This exam was performed according to the departmental dose-optimization program which includes automated exposure control, adjustment of the mA and/or kV according to patient size and/or use of iterative reconstruction technique. COMPARISON:  CT C Spine 08/02/19, CT head 08/12/20  FINDINGS: CT HEAD FINDINGS Brain: No evidence of acute infarction, hemorrhage, hydrocephalus, extra-axial collection or mass lesion/mass effect. Redemonstrated is chronic infarct in the anterior right temporal lobe with mild ex vacuo dilatation of the right temporal horn. 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 Vascular: No hyperdense vessel or unexpected calcification. Skull: Normal. Negative for fracture or focal lesion. Sinuses/Orbits: No middle ear or mastoid effusion. Paranasal sinuses are clear. Orbits are unremarkable. Other: None. CT CERVICAL SPINE FINDINGS Alignment: Trace anterolisthesis of C3 on C4, C4 on C5. Skull base and vertebrae: No acute fracture. No primary bone lesion or focal pathologic process. Disc space loss at C6-C7 has slightly progressed from prior exam. Soft tissues and spinal canal: No prevertebral fluid or swelling. No visible canal hematoma.  Disc levels: No CT evidence of high-grade spinal canal stenosis. There is multilevel severe degenerative facet disease resulting in moderate neural foraminal stenosis at C3-C4 on the right and severe neural foraminal stenosis at C4-C5 bilaterally. Upper chest: See separately dictated CT chest for additional findings. Severe centrilobular emphysema. Other: None IMPRESSION: CT HEAD: 1. No acute intracranial abnormality. 2. 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 CT CERVICAL SPINE: No acute fracture or traumatic malalignment of the cervical spine. Emphysema (ICD10-J43.9). Electronically Signed   By: Marin Roberts M.D.   On: 02/15/2022 14:04   CT Chest Wo Contrast  Result Date: 02/15/2022 CLINICAL DATA:  LEFT chest wall trauma. EXAM: CT CHEST WITHOUT CONTRAST TECHNIQUE: Multidetector CT imaging of the chest was performed following the standard protocol without IV contrast. RADIATION DOSE REDUCTION: This exam was performed according to the departmental dose-optimization  program which includes automated exposure control, adjustment of the mA and/or kV according to patient size and/or use of iterative reconstruction technique. COMPARISON:  None Available. FINDINGS: Cardiovascular: No thoracic aortic aneurysm. Aortic atherosclerosis. Three-vessel coronary artery calcifications, particularly dense within the LEFT anterior descending coronary artery. No clinically significant pericardial effusion. Mediastinum/Nodes: No hemorrhage or edema is seen within the mediastinum. No mass or enlarged lymph nodes are seen within the mediastinum. Esophagus is unremarkable. Trachea is unremarkable. Lungs/Pleura: Paraseptal and centrilobular emphysematous changes bilaterally, upper lobe predominant. Bibasilar consolidations, LEFT greater than RIGHT. Small bilateral pleural effusions. No pneumothorax. Upper Abdomen: Limited images of the upper abdomen are unremarkable. Musculoskeletal: Minimally displaced fractures involving the posterior and posterior-lateral segments of the LEFT eleventh rib. Additional slightly displaced fracture of the posterior-lateral LEFT tenth rib and LEFT lateral ninth rib. Chronic wedge compression deformity of the L1 vertebral body. Degenerative spondylosis of the thoracic spine, mild to moderate in degree. IMPRESSION: 1. Minimally displaced to slightly displaced fractures of the LEFT ninth through eleventh ribs, as detailed above. 2. Bibasilar consolidations, LEFT greater than RIGHT, most likely a combination of atelectasis and pneumonia or aspiration. Favor at least some component of aspiration given the recent trauma. 3. Small bilateral pleural effusions. No pneumothorax. 4. Three-vessel coronary artery calcifications, particularly dense within the LEFT anterior descending coronary artery. Recommend correlation with any possible associated cardiac symptoms. 5. Chronic wedge compression deformity of the L1 vertebral body. Emphysema (ICD10-J43.9). Electronically Signed    By: Franki Cabot M.D.   On: 02/15/2022 14:03   DG Ankle Complete Right  Result Date: 02/15/2022 CLINICAL DATA:  Right ankle pain, status post fall EXAM: RIGHT ANKLE - COMPLETE 3+ VIEW COMPARISON:  08/11/2020 ankle radiographs FINDINGS: Comminuted, mildly displaced and impacted fracture of the distal fibula. Comminuted, mildly displaced and distracted fracture through the medial malleolus. Soft tissue swelling about the ankle. IMPRESSION: Comminuted, mildly displaced and impacted fractures through the distal fibula and medial malleolus. Electronically Signed   By: Merilyn Baba M.D.   On: 02/15/2022 13:09    Orson Eva, DO  [facility]  If 7PM-7AM, please contact night-coverage [email or web address] Password [facility] 02/21/2022, 9:19 AM   LOS: 6 days

## 2022-02-21 NOTE — TOC Progression Note (Addendum)
Transition of Care [facility]) - Progression Note    Patient Details  Name: Cassidy Smith MRN: [id number] Date of Birth: 10/18/1955  [facility]) CM/SW Contact  Boneta Lucks, RN Phone Number: 02/21/2022, 2:16 PM  Clinical Narrative:   Reviewed Bed offers with Son, Thedore Mins. He wants [employer]. Edwena Blow updated and updated that we are discharge planning for Saturday. [facility] ask for new FL2. Uploaded to [place] [facility]. TOC to follow.   Addendum [name] [phone] [id number]  Expected Discharge Plan: Skilled Nursing Facility Barriers to Discharge: Continued Medical Work up  Expected Discharge Plan and Services     Social Determinants of Health (SDOH) Interventions SDOH Screenings   Food Insecurity: No Food Insecurity (02/15/2022)  Housing: Low Risk  (02/15/2022)  Transportation Needs: No Transportation Needs (02/15/2022)  Utilities: Not At Risk (02/15/2022)  Tobacco Use: Medium Risk (02/15/2022)    Readmission Risk Interventions    07/26/2020   12:20 PM  Readmission Risk Prevention Plan  Transportation Screening Complete  Home Care Screening Complete  Medication Review (RN CM) Complete

## 2022-02-21 NOTE — NC FL2 (Signed)
[place] LEVEL OF CARE FORM     IDENTIFICATION  Patient Name: Cassidy Smith Birthdate: 09-21-1955 Sex: female Admission Date (Current Location): 02/15/2022  [facility] and [place] Number:  [employer] and Address:  [facility] [address] [place]      Provider Number: [id number]  Attending Physician Name and Address:  Orson Eva, MD  Relative Name and Phone Number:  Tamya, Denardo  [phone]    Current Level of Care: Hospital Recommended Level of Care: [place] Prior Approval Number:    Date Approved/Denied:   PASRR Number: Pending  Discharge Plan: SNF    Current Diagnoses: Patient Active Problem List   Diagnosis Date Noted   Acute heart failure with preserved ejection fraction (HFpEF) ([place]) 02/20/2022   Acute respiratory failure with hypoxia ([place]) 02/20/2022   AKI (acute kidney injury) ([place]) 02/15/2022   Aspiration pneumonia ([place]) 02/15/2022   Leukocytosis 02/15/2022   Falls frequently 02/15/2022   SIRS (systemic inflammatory response syndrome) ([place]) 08/12/2020   Mild protein malnutrition ([place]) 08/12/2020   Sepsis ([place]) 07/25/2020   Sepsis with acute hypoxic respiratory failure without septic shock ([place])    Dementia without behavioral disturbance ([place])    Palliative care by specialist    Goals of care, counseling/discussion    Pneumonia of right lower lobe due to infectious organism 11/06/2019   Intracerebral hematoma ([place]) 07/04/2019   Closed head injury 07/03/2019   Thrombocytopenia ([place]) 07/03/2019   Thyrotoxicosis, unspecified with thyrotoxic crisis or storm 01/15/2019   History of syndrome of inappropriate antidiuretic hormone (SIADH) 01/15/2019   UTI (urinary tract infection) 01/04/2018   Neuromuscular disorder ([place])    Dysuria 10/30/2017   Fever 08/15/2017   Hypokalemia 08/15/2017   Altered mental state 47/82/9562   Acute metabolic encephalopathy 13/08/6576   Valproic acid  toxicity 08/15/2017   Rectal cancer ([place]) 07/10/2017   Chronic deep vein thrombosis (DVT) of distal vein of lower extremity (HCC) 07/10/2017   Altered mental status    Ischemic stroke ([place])    GERD (gastroesophageal reflux disease) 05/12/2017   GI bleed    SIADH (syndrome of inappropriate ADH production) ([place]) 04/08/2017   Dysplastic rectal polyp    Protein-calorie malnutrition, severe 04/07/2017   Pressure injury of skin 04/04/2017   Anticoagulated 04/03/2017   Stool bloody 04/03/2017   Current every day smoker 04/03/2017   Rectal bleeding    Chronic diarrhea    Hyperthyroidism    S/P ORIF (open reduction internal fixation) fracture right hip IM nail 03/07/17 03/24/2017   Hypomagnesemia 03/07/2017   Fall    Closed intertrochanteric fracture of hip, right, initial encounter ([place]) 03/06/2017   Radial styloid fracture: right 03/06/2017   Normocytic anemia 03/06/2017   Orthostatic hypotension 06/07/2015   Lower urinary tract infectious disease 06/07/2015   Rhabdomyolysis 06/07/2015   White matter abnormality on MRI of brain 05/14/2013   History of depression 05/14/2013   Hx of anxiety disorder 05/14/2013   Bipolar I disorder, most recent episode (or current) manic ([place]) 05/14/2013   Bipolar 1 disorder ([place]) 05/14/2013   Hyponatremia 05/12/2013   Lacunar infarct, acute ([place]) 05/12/2013    Orientation RESPIRATION BLADDER Height & Weight     Self  O2 (Currently on 8L) External catheter Weight: 64.8 kg Height:  '5\' 5"'$  (165.1 cm)  BEHAVIORAL SYMPTOMS/MOOD NEUROLOGICAL BOWEL NUTRITION STATUS      Incontinent Diet  AMBULATORY STATUS COMMUNICATION OF NEEDS Skin     Verbally Bruising  Personal Care Assistance Level of Assistance  Bathing, Feeding, Dressing Bathing Assistance: Maximum assistance Feeding assistance: Limited assistance Dressing Assistance: Maximum assistance     Functional Limitations Info  Sight, Hearing, Speech Sight Info:  Adequate Hearing Info: Adequate Speech Info: Impaired    SPECIAL CARE FACTORS FREQUENCY  PT (By licensed PT)     PT Frequency: 5 times a week              Contractures Contractures Info: Not present    Additional Factors Info  Code Status, Allergies Code Status Info: DNR Allergies Info: NKDA           Current Medications (02/21/2022):  This is the current hospital active medication list Current Facility-Administered Medications  Medication Dose Route Frequency Provider Last Rate Last Admin   acetaminophen (TYLENOL) tablet 650 mg  650 mg Oral Q6H Johnson, Clanford L, MD   650 mg at 02/21/22 6568   Or   acetaminophen (TYLENOL) suppository 650 mg  650 mg Rectal Q6H Johnson, Clanford L, MD   650 mg at 02/18/22 0514   aspirin EC tablet 81 mg  81 mg Oral Q breakfast Johnson, Clanford L, MD   81 mg at 02/21/22 0819   atorvastatin (LIPITOR) tablet 20 mg  20 mg Oral q1800 Johnson, Clanford L, MD   20 mg at 02/20/22 1834   benztropine (COGENTIN) tablet 1 mg  1 mg Oral BID Wynetta Emery, Clanford L, MD   1 mg at 02/21/22 1275   bisacodyl (DULCOLAX) EC tablet 5 mg  5 mg Oral Daily PRN Wynetta Emery, Clanford L, MD       budesonide (PULMICORT) nebulizer solution 0.5 mg  0.5 mg Nebulization BID Tat, David, MD   0.5 mg at 02/21/22 1700   cyanocobalamin (VITAMIN B12) tablet 1,000 mcg  1,000 mcg Oral Daily Wynetta Emery, Clanford L, MD   1,000 mcg at 02/21/22 0819   divalproex (DEPAKOTE ER) 24 hr tablet 1,000 mg  1,000 mg Oral QHS Johnson, Clanford L, MD   1,000 mg at 02/20/22 2234   feeding supplement (ENSURE ENLIVE / ENSURE PLUS) liquid 237 mL  237 mL Oral BID BM Johnson, Clanford L, MD   237 mL at 02/21/22 0820   furosemide (LASIX) injection 40 mg  40 mg Intravenous BID Tat, Shanon Brow, MD   40 mg at 02/21/22 0819   gabapentin (NEURONTIN) capsule 100 mg  100 mg Oral QHS Johnson, Clanford L, MD   100 mg at 02/20/22 2243   hydrALAZINE (APRESOLINE) injection 10 mg  10 mg Intravenous Q4H PRN Johnson, Clanford L, MD        ipratropium-albuterol (DUONEB) 0.5-2.5 (3) MG/3ML nebulizer solution 3 mL  3 mL Nebulization Q6H PRN Johnson, Clanford L, MD   3 mL at 02/19/22 1145   ipratropium-albuterol (DUONEB) 0.5-2.5 (3) MG/3ML nebulizer solution 3 mL  3 mL Nebulization Q6H Tat, David, MD   3 mL at 02/21/22 1414   lidocaine (LIDODERM) 5 % 1 patch  1 patch Transdermal Q24H Johnson, Clanford L, MD   1 patch at 02/20/22 1452   magnesium oxide (MAG-OX) tablet 400 mg  400 mg Oral Daily Tat, David, MD   400 mg at 02/21/22 0819   methimazole (TAPAZOLE) tablet 10 mg  10 mg Oral QODAY Johnson, Clanford L, MD   10 mg at 02/20/22 0914   nicotine (NICODERM CQ - dosed in mg/24 hours) patch 21 mg  21 mg Transdermal Daily Johnson, Clanford L, MD   21 mg at 02/21/22 [phone]  ondansetron (ZOFRAN) tablet 4 mg  4 mg Oral Q6H PRN Johnson, Clanford L, MD       Or   ondansetron (ZOFRAN) injection 4 mg  4 mg Intravenous Q6H PRN Johnson, Clanford L, MD       pantoprazole (PROTONIX) EC tablet 40 mg  40 mg Oral QHS Johnson, Clanford L, MD   40 mg at 02/20/22 2244   [START ON 02/22/2022] pneumococcal 20-valent conjugate vaccine (PREVNAR 20) injection 0.5 mL  0.5 mL Intramuscular Tomorrow-1000 Tat, David, MD       potassium chloride SA (KLOR-CON M) CR tablet 20 mEq  20 mEq Oral Daily Tat, David, MD   20 mEq at 02/21/22 1004   traZODone (DESYREL) tablet 100 mg  100 mg Oral QHS Johnson, Clanford L, MD   100 mg at 02/20/22 2243     Discharge Medications: Please see discharge summary for a list of discharge medications.  Relevant Imaging Results:  Relevant Lab Results:   Additional Information SS# [id number]  Boneta Lucks, RN

## 2022-02-21 NOTE — Progress Notes (Signed)
Patient REDs Clip attempted. Patient moving about too much for unit to read. Patient work of breathing increased from reported baseline. 4l/[place] has been increased to 6l/[place], and patient 91% on her o2 sat. Notified Dr. Carles Collet.

## 2022-02-21 NOTE — Progress Notes (Signed)
Physical Therapy Treatment Patient Details Name: Cassidy Smith MRN: [id number] DOB: 1955/06/22 Today's Date: 02/21/2022   History of Present Illness patient has been having frequent falls recently at home and gait instability which is worsened over the past 3 to 4 days.  She also is having intermittent confusion which is worse from her baseline.  She has had poor oral intake over the past several days according to caretakers.  Patient denies having fever and chills.  Patient denies chest pain and reports having cough and chest congestion.     Patient was found down this morning by her care aides.  She apparently had fallen sometime in the middle of the night.  She had noticeable bruising and swelling to her right ankle and she hit her head.  She has bruising on her left rib cage.  Patient normally ambulates with a walker.  Family reports that patient has not been at her baseline mentation for the past several days.     Workup in the emergency department reveals that patient has severe acute kidney injury, she has a fractured right ankle and multiple rib fractures.  She also has evidence of an aspiration pneumonitis and she was started on IV fluids and IV antibiotics.  Admission was requested for further management.    PT Comments    Pt tolerated today's session well, with increased participation in therapeutic activity interventions. Pt demonstrating improvements in cognition and able to follow single commands more consistently, still requiring increased time for processing. Pt continues to given assistance for functional mobility  in/out of bed with use of RW for management/coordination of movement. Pt was changed to 6Ls O2 by respiratory due to increased WOB Pt continues to demonstrate generalized weakness and reduced endurance to functional activities indicating the continued need and benefit of skilled physical therapy services to address/progress OOB functional mobility and ADLs accordingly.  Discharge disposition to continue remain the same.     Recommendations for follow up therapy are one component of a multi-disciplinary discharge planning process, led by the attending physician.  Recommendations may be updated based on patient status, additional functional criteria and insurance authorization.  Follow Up Recommendations  Skilled nursing-short term rehab (<3 hours/day) Can patient physically be transported by private vehicle: No   Assistance Recommended at Discharge Frequent or constant Supervision/Assistance  Patient can return home with the following A lot of help with walking and/or transfers;A lot of help with bathing/dressing/bathroom;Assistance with cooking/housework   Equipment Recommendations  None recommended by PT    Recommendations for Other Services       Precautions / Restrictions Precautions Precautions: Fall Restrictions Weight Bearing Restrictions: Yes RLE Weight Bearing: Non weight bearing Other Position/Activity Restrictions: RLE     Mobility  Bed Mobility Overal bed mobility: Needs Assistance Bed Mobility: Supine to Sit     Supine to sit: Min assist, Mod assist     General bed mobility comments: supine to sit with MinA for completing 10% of movement. HOB elevated with swinging BLEs toward EOB. Utilizing both bedrails for pulling up to sit with MinA to find handrail to pull into forwarding sitting. pt given Mod assist with heavy cuing for lateral trunk shifting to perform anterior slide toward EOB for foot flat contact. Patient Response: Cooperative  Transfers Overall transfer level: Needs assistance Equipment used: Rolling walker (2 wheels) Transfers: Sit to/from Stand, Bed to chair/wheelchair/BSC Sit to Stand: Min assist Stand pivot transfers: Mod assist Step pivot transfers: Mod assist  General transfer comment: Pt given constant single command cues for RW management, anticipatory movements for setup into transfer positioning.  Mod assist given to maintain anterior weight shift and proper COM within RW. 2x sit/stands once from EOB and once from recliner. MinA for powerup to stand with use BUE, along with heavy single command cues for hand management to RW.    Ambulation/Gait             Pre-gait activities: BLE LAQ warmup, pt given heavy and consistent cuing with continue minor loss of focus on task.     Stairs             Wheelchair Mobility    Modified Rankin (Stroke Patients Only)       Balance Overall balance assessment: Needs assistance Sitting-balance support: Bilateral upper extremity supported, Feet supported Sitting balance-Leahy Scale: Fair Sitting balance - Comments: Fair/fair sitting balance, requiring use of BUE on bed with B feet on ground. Natural posterior lean, limited sitting balance due to fatigue and reduced muscle weakness from limited progressive mobility. Postural control: Posterior lean Standing balance support: Bilateral upper extremity supported, During functional activity, Reliant on assistive device for balance Standing balance-Leahy Scale: Fair Standing balance comment: fair/fair reduced anticipatory/postural strategies due to RLE NWB along with increased muscle weakness and fatigue.                            Cognition Arousal/Alertness: Awake/alert   Overall Cognitive Status: Within Functional Limits for tasks assessed                         Following Commands: Follows one step commands with increased time                Exercises General Exercises - Lower Extremity Long Arc Quad: AROM, Both, 20 reps    General Comments        Pertinent Vitals/Pain Pain Assessment Pain Assessment: Faces Faces Pain Scale: Hurts little more Pain Location: RLE    Home Living                          Prior Function            PT Goals (current goals can now be found in the care plan section) Progress towards PT goals:  Progressing toward goals    Frequency    Min 4X/week      PT Plan Current plan remains appropriate    Co-evaluation              AM-PAC PT "6 Clicks" Mobility   Outcome Measure  Help needed turning from your back to your side while in a flat bed without using bedrails?: A Little Help needed moving from lying on your back to sitting on the side of a flat bed without using bedrails?: A Little Help needed moving to and from a bed to a chair (including a wheelchair)?: A Lot Help needed standing up from a chair using your arms (e.g., wheelchair or bedside chair)?: A Little Help needed to walk in hospital room?: A Lot Help needed climbing 3-5 steps with a railing? : Total 6 Click Score: 14    End of Session Equipment Utilized During Treatment: Gait belt Activity Tolerance: Patient tolerated treatment well;Patient limited by fatigue Patient left: with call bell/phone within reach;with chair alarm set;in chair Nurse Communication: Mobility status PT Visit  Diagnosis: Unsteadiness on feet (R26.81);Repeated falls (R29.6);Muscle weakness (generalized) (M62.81);History of falling (Z91.81)     Time: 9276-3943 PT Time Calculation (min) (ACUTE ONLY): 23 min  Charges:  $Therapeutic Activity: 23-37 mins                     Wonda Olds PT, DPT Physical Therapist with [place] [phone] office   Wonda Olds 02/21/2022, 9:11 AM

## 2022-02-22 DIAGNOSIS — J9601 Acute respiratory failure with hypoxia: Secondary | ICD-10-CM | POA: Diagnosis not present

## 2022-02-22 DIAGNOSIS — J690 Pneumonitis due to inhalation of food and vomit: Secondary | ICD-10-CM | POA: Diagnosis not present

## 2022-02-22 DIAGNOSIS — N179 Acute kidney failure, unspecified: Secondary | ICD-10-CM | POA: Diagnosis not present

## 2022-02-22 DIAGNOSIS — I5031 Acute diastolic (congestive) heart failure: Secondary | ICD-10-CM | POA: Diagnosis not present

## 2022-02-22 LAB — CBC
HCT: 21.9 % — ABNORMAL LOW (ref 36.0–46.0)
Hemoglobin: 8.1 g/dL — ABNORMAL LOW (ref 12.0–15.0)
MCH: 37.3 pg — ABNORMAL HIGH (ref 26.0–34.0)
MCHC: 37 g/dL — ABNORMAL HIGH (ref 30.0–36.0)
MCV: 100.9 fL — ABNORMAL HIGH (ref 80.0–100.0)
Platelets: 86 10*3/uL — ABNORMAL LOW (ref 150–400)
RBC: 2.17 MIL/uL — ABNORMAL LOW (ref 3.87–5.11)
RDW: 19.7 % — ABNORMAL HIGH (ref 11.5–15.5)
WBC: 7.6 10*3/uL (ref 4.0–10.5)
nRBC: 0.4 % — ABNORMAL HIGH (ref 0.0–0.2)

## 2022-02-22 LAB — VITAMIN B12: Vitamin B-12: 4395 pg/mL — ABNORMAL HIGH (ref 180–914)

## 2022-02-22 LAB — BRAIN NATRIURETIC PEPTIDE: B Natriuretic Peptide: 222 pg/mL — ABNORMAL HIGH (ref 0.0–100.0)

## 2022-02-22 LAB — BASIC METABOLIC PANEL
Anion gap: 7 (ref 5–15)
BUN: 17 mg/dL (ref 8–23)
CO2: 36 mmol/L — ABNORMAL HIGH (ref 22–32)
Calcium: 8.9 mg/dL (ref 8.9–10.3)
Chloride: 98 mmol/L (ref 98–111)
Creatinine, Ser: 0.7 mg/dL (ref 0.44–1.00)
GFR, Estimated: >60 mL/min (ref >60)
Glucose, Bld: 87 mg/dL (ref 70–99)
Potassium: 3.7 mmol/L (ref 3.5–5.1)
Sodium: 141 mmol/L (ref 135–145)

## 2022-02-22 LAB — OCCULT BLOOD X 1 CARD TO LAB, STOOL: Fecal Occult Bld: NEGATIVE

## 2022-02-22 LAB — PATHOLOGIST SMEAR REVIEW

## 2022-02-22 LAB — TSH: TSH: 1.388 u[IU]/mL (ref 0.350–4.500)

## 2022-02-22 LAB — MAGNESIUM: Magnesium: 1.6 mg/dL — ABNORMAL LOW (ref 1.7–2.4)

## 2022-02-22 LAB — FOLATE: Folate: 19.9 ng/mL (ref >5.9)

## 2022-02-22 MED ORDER — MAGNESIUM OXIDE -MG SUPPLEMENT 400 (240 MG) MG PO TABS
400.0000 mg | ORAL_TABLET | Freq: Two times a day (BID) | ORAL | Status: DC
  Administered 2022-02-22 – 2022-02-24 (×5): 400 mg via ORAL
  Filled 2022-02-22 (×5): qty 1

## 2022-02-22 NOTE — Progress Notes (Signed)
PROGRESS NOTE  Cassidy Smith [id number] DOB: October 17, 1955 DOA: 02/15/2022 PCP: Neale Burly, MD  Brief History:  67 year old female with history of depression, anxiety, bipolar 1 disorder, chronic DVT, chronic hip pain, dementia, history of UTIs and hypothyroidism, previous intracerebral hematoma, history of hemorrhagic CVA, SIADH, DNR present on admission, history of rhabdomyolysis who apparently has been living alone but having care aides that are with her during the daytime however she remains alone at night.  Historian reported that patient has been having frequent falls recently at home and gait instability which is worsened over the past 3 to 4 days.  She also is having intermittent confusion which is worse from her baseline.  She has had poor oral intake over the past several days according to caretakers.  Patient denies having fever and chills.  Patient denies chest pain and reports having cough and chest congestion.  Patient was found down this morning by her care aides.  She apparently had fallen sometime in the middle of the night.  She had noticeable bruising and swelling to her right ankle and she hit her head.  She has bruising on her left rib cage.  Patient normally ambulates with a walker.  Family reports that patient has not been at her baseline mentation for the past several days.  Workup in the emergency department reveals that patient has severe acute kidney injury, she has a fractured right ankle and multiple rib fractures.  She also has evidence of an aspiration pneumonitis and she was started on IV fluids and IV antibiotics.  Admission was requested for further management.     Assessment/Plan:  AKI- treated and resolved  Prerenal secondary to severe dehydration Initially fluid resuscitated Renally dose medications Follow daily basic metabolic panel Creatinine improved to 2.27 >> 0.64   Aspiration pneumonia Had 5 days ceftriaxone and azithro--finished  2/6 Dysphagia diet ordered with aspiration precautions Supplemental oxygen as needed SLP eval requested    Acute respiratory distress /Acute respiratory failure with hypoxia - stop IV fluids  - started lasix IV--increased to BID - 2/9 ReDS--32 - 2/7 personally reviewed CXR--pulm edema, bilateral pleural effusion L>R - 2/7 Left side thora--800cc removed>>transudate -now stable on 4-5L -aim for oxygen saturation 88-92% -repeat CXR 2/10   Acute HFpEF -2/6 echo--EF 70-75%, mid-mod TR, small pericardial eff -start daily IV lasix--increase to bid -accurate I/O -daily weights - 2/7 Left side thora--800cc removed>>transudate   Dysphagia (severe) NPO initially SLP now recommending Dys 1 diet with NTL Aspiration precautions   Leukocytosis - resolved  Secondary to pneumonia and aspiration Resolved    Right ankle fracture Discussed with orthopedist Dr. Amedeo Kinsman who recommends splinting and outpatient follow-up with orthopedics Pain management as ordered NWB to RLE until ok with orthopedics Appreciate ortho consult, she will remain NWB and outpatient follow up in 2 weeks Initially refused SNF>>now agreeable   Falls Frequently/High Fall Risk Fall precautions recommended PT evaluation requested and recommending SNF TOC consulted for SNF placement  Multiple discussions with son and he is adamant he wants pt at his home with [facility] services and he has arranged for 24/7 supervision     Bipolar 1 disorder Resumed home behavioral health medications Delirium precautions recommended   Tobacco user/COPD Nicotine patch ordered Cessation discussed -nearly 100 pack year history -start duoneb and pulmicort   Hyperthyroidism Resumed home methimazole    DNR present on admission Advance care planning documents have been scanned into the EMR and  reviewed   Essential hypertension Diet controlled, following           Family Communication:   son updated 2/8   Consultants:  none   Code  Status:  DNR   DVT Prophylaxis:  [place] Heparin     Procedures: As Listed in Progress Note Above   Antibiotics: Ceftriaxone 2/2>>2/6 Azithro 2/2>>2/6            Subjective: Patient denies fevers, chills, headache, chest pain, dyspnea, nausea, vomiting, diarrhea, abdominal pain, dysuria, hematuria, hematochezia, and melena.   Objective: Vitals:   02/22/22 0805 02/22/22 0806 02/22/22 1337 02/22/22 1439  BP:   122/72   Pulse:   80   Resp:   18   Temp:   98.6 F (37 C)   TempSrc:   Oral   SpO2: 95% 100% 96% 94%  Weight:      Height:        Intake/Output Summary (Last 24 hours) at 02/22/2022 1802 Last data filed at 02/22/2022 1300 Gross per 24 hour  Intake 810 ml  Output 1000 ml  Net -190 ml   Weight change: 2.1 kg Exam:  General:  Pt is alert, follows commands appropriately, not in acute distress HEENT: No icterus, No thrush, No neck mass, [place]/AT Cardiovascular: RRR, S1/S2, no rubs, no gallops Respiratory: bibasilar rales. No wheeze Abdomen: Soft/+BS, non tender, non distended, no guarding Extremities: No edema, No lymphangitis, No petechiae, No rashes, no synovitis   Data Reviewed: I have personally reviewed following labs and imaging studies Basic Metabolic Panel: Recent Labs  Lab 02/16/22 0446 02/17/22 0348 02/18/22 0424 02/18/22 0857 02/19/22 0404 02/20/22 1018 02/21/22 0405 02/22/22 0402  NA 139   < >  --  139 140 143 141 141  K 4.5   < >  --  4.3 4.4 3.8 3.8 3.7  CL 99   < >  --  103 102 100 98 98  CO2 30   < >  --  24 34* 36* 35* 36*  GLUCOSE 96   < > 92 88 126* 108* 112* 87  BUN 44*   < >  --  29* 27* 20 19 17  $ CREATININE 2.27*   < >  --  0.84 0.80 0.70 0.64 0.70  CALCIUM 8.9   < >  --  9.0 8.9 9.4 8.9 8.9  MG 1.8   < > 1.7  --  1.9 1.5* 1.9 1.6*  PHOS 3.4  --   --   --   --   --   --   --    < > = values in this interval not displayed.   Liver Function Tests: Recent Labs  Lab 02/21/22 0405  AST 20  ALT 15  ALKPHOS 90  BILITOT 0.4   PROT 5.3*  ALBUMIN 2.0*   No results for input(s): "LIPASE", "AMYLASE" in the last 168 hours. No results for input(s): "AMMONIA" in the last 168 hours. Coagulation Profile: No results for input(s): "INR", "PROTIME" in the last 168 hours. CBC: Recent Labs  Lab 02/16/22 0446 02/17/22 0348 02/20/22 1018 02/22/22 0402  WBC 8.8 7.5 7.9 7.6  NEUTROABS 6.5 3.7  --   --   HGB 10.1* 9.2* 9.0* 8.1*  HCT 31.8* 28.8* 26.4* 21.9*  MCV 95.5 94.7 103.5* 100.9*  PLT 108* 90* 52* 86*   Cardiac Enzymes: No results for input(s): "CKTOTAL", "CKMB", "CKMBINDEX", "TROPONINI" in the last 168 hours. BNP: Invalid input(s): "POCBNP" CBG: No results for input(s): "GLUCAP"  in the last 168 hours. HbA1C: No results for input(s): "HGBA1C" in the last 72 hours. Urine analysis:    Component Value Date/Time   COLORURINE YELLOW 08/12/2020 1339   APPEARANCEUR CLEAR 08/12/2020 1339   LABSPEC 1.015 08/12/2020 1339   PHURINE 8.0 08/12/2020 1339   GLUCOSEU NEGATIVE 08/12/2020 1339   HGBUR NEGATIVE 08/12/2020 1339   BILIRUBINUR NEGATIVE 08/12/2020 1339   KETONESUR 5 (A) 08/12/2020 1339   PROTEINUR NEGATIVE 08/12/2020 1339   UROBILINOGEN 0.2 05/12/2013 0210   NITRITE NEGATIVE 08/12/2020 1339   LEUKOCYTESUR NEGATIVE 08/12/2020 1339   Sepsis Labs: @LABRCNTIP$ (procalcitonin:4,lacticidven:4) ) Recent Results (from the past 240 hour(s))  Blood culture (routine x 2)     Status: None   Collection Time: 02/15/22  3:32 PM   Specimen: BLOOD  Result Value Ref Range Status   Specimen Description BLOOD BLOOD RIGHT ARM  Final   Special Requests Blood Culture adequate volume  Final   Culture   Final    NO GROWTH 6 DAYS Performed at [facility], [address], [place], [place] [address]    Report Status 02/21/2022 FINAL  Final  Blood culture (routine x 2)     Status: None   Collection Time: 02/15/22  3:32 PM   Specimen: BLOOD  Result Value Ref Range Status   Specimen Description BLOOD BLOOD RIGHT HAND  AEROBIC BOTTLE ONLY  Final   Special Requests Blood Culture adequate volume  Final   Culture   Final    NO GROWTH 6 DAYS Performed at [facility], [address], [place], [place] [address]    Report Status 02/21/2022 FINAL  Final  Resp panel by RT-PCR (RSV, Flu A&B, Covid) Anterior Nasal Swab     Status: None   Collection Time: 02/20/22  9:45 AM   Specimen: Anterior Nasal Swab  Result Value Ref Range Status   SARS Coronavirus 2 by RT PCR NEGATIVE NEGATIVE Final    Comment: (NOTE) SARS-CoV-2 target nucleic acids are NOT DETECTED.  The SARS-CoV-2 RNA is generally detectable in upper respiratory specimens during the acute phase of infection. The lowest concentration of SARS-CoV-2 viral copies this assay can detect is 138 copies/mL. A negative result does not preclude SARS-Cov-2 infection and should not be used as the sole basis for treatment or other patient management decisions. A negative result may occur with  improper specimen collection/handling, submission of specimen other than nasopharyngeal swab, presence of viral mutation(s) within the areas targeted by this assay, and inadequate number of viral copies(<138 copies/mL). A negative result must be combined with clinical observations, patient history, and epidemiological information. The expected result is Negative.  Fact Sheet for Patients:  EntrepreneurPulse.com.au  Fact Sheet for Healthcare Providers:  IncredibleEmployment.be  This test is no t yet approved or cleared by the [place] FDA and  has been authorized for detection and/or diagnosis of SARS-CoV-2 by FDA under an Emergency Use Authorization (EUA). This EUA will remain  in effect (meaning this test can be used) for the duration of the COVID-19 declaration under Section 564(b)(1) of the Act, 21 U.S.C.section 360bbb-3(b)(1), unless the authorization is terminated  or revoked sooner.       Influenza A by PCR NEGATIVE  NEGATIVE Final   Influenza B by PCR NEGATIVE NEGATIVE Final    Comment: (NOTE) The Xpert Xpress SARS-CoV-2/FLU/RSV plus assay is intended as an aid in the diagnosis of influenza from Nasopharyngeal swab specimens and should not be used as a sole basis for treatment. Nasal washings and aspirates  are unacceptable for Xpert Xpress SARS-CoV-2/FLU/RSV testing.  Fact Sheet for Patients: EntrepreneurPulse.com.au  Fact Sheet for Healthcare Providers: IncredibleEmployment.be  This test is not yet approved or cleared by the [place] FDA and has been authorized for detection and/or diagnosis of SARS-CoV-2 by FDA under an Emergency Use Authorization (EUA). This EUA will remain in effect (meaning this test can be used) for the duration of the COVID-19 declaration under Section 564(b)(1) of the Act, 21 U.S.C. section 360bbb-3(b)(1), unless the authorization is terminated or revoked.     Resp Syncytial Virus by PCR NEGATIVE NEGATIVE Final    Comment: (NOTE) Fact Sheet for Patients: EntrepreneurPulse.com.au  Fact Sheet for Healthcare Providers: IncredibleEmployment.be  This test is not yet approved or cleared by the [place] FDA and has been authorized for detection and/or diagnosis of SARS-CoV-2 by FDA under an Emergency Use Authorization (EUA). This EUA will remain in effect (meaning this test can be used) for the duration of the COVID-19 declaration under Section 564(b)(1) of the Act, 21 U.S.C. section 360bbb-3(b)(1), unless the authorization is terminated or revoked.  Performed at [facility], [address] [place], [place] [address]   Gram stain     Status: None   Collection Time: 02/20/22 11:30 AM   Specimen: Pleura  Result Value Ref Range Status   Specimen Description PLEURAL  Final   Special Requests LEFT CHEST  Final   Gram Stain   Final    NO ORGANISMS SEEN WBC PRESENT, PREDOMINANTLY  MONONUCLEAR CYTOSPIN SMEAR Performed at [facility], [address], [place], [place] [address]    Report Status 02/20/2022 FINAL  Final  Culture, body fluid w Gram Stain-bottle     Status: None (Preliminary result)   Collection Time: 02/20/22 11:30 AM   Specimen: Pleura  Result Value Ref Range Status   Specimen Description PLEURAL LEFT CHEST  Final   Special Requests BOTTLES DRAWN AEROBIC AND ANAEROBIC 10CC  Final   Culture   Final    NO GROWTH < 24 HOURS Performed at [facility], [address], [place], [place] [address]    Report Status PENDING  Incomplete     Scheduled Meds:  acetaminophen  650 mg Oral Q6H   Or   acetaminophen  650 mg Rectal Q6H   aspirin EC  81 mg Oral Q breakfast   atorvastatin  20 mg Oral q1800   benztropine  1 mg Oral BID   budesonide (PULMICORT) nebulizer solution  0.5 mg Nebulization BID   cyanocobalamin  1,000 mcg Oral Daily   divalproex  1,000 mg Oral QHS   feeding supplement  237 mL Oral BID BM   furosemide  40 mg Intravenous BID   gabapentin  100 mg Oral QHS   ipratropium-albuterol  3 mL Nebulization TID   lidocaine  1 patch Transdermal Q24H   magnesium oxide  400 mg Oral BID   methimazole  10 mg Oral QODAY   nicotine  21 mg Transdermal Daily   pantoprazole  40 mg Oral QHS   pneumococcal 20-valent conjugate vaccine  0.5 mL Intramuscular Tomorrow-1000   potassium chloride  20 mEq Oral Daily   traZODone  100 mg Oral QHS   Continuous Infusions:  Procedures/Studies: US THORACENTESIS ASP PLEURAL SPACE W/IMG GUIDE  Result Date: 02/20/2022 INDICATION: Dyspnea, pleural effusion after fall with rib fractures EXAM: ULTRASOUND GUIDED LEFT THORACENTESIS MEDICATIONS: None. COMPLICATIONS: None immediate. PROCEDURE: An ultrasound guided thoracentesis was thoroughly discussed with the patient and questions answered. The benefits, risks, alternatives and  complications were also discussed. The patient understands and wishes to proceed with the procedure.  Written consent was obtained. Ultrasound was performed to localize and mark an adequate pocket of fluid in the left chest. The area was then prepped and draped in the normal sterile fashion. 1% Lidocaine was used for local anesthesia. Under ultrasound guidance a 8Fr catheter was introduced. Thoracentesis was performed. The catheter was removed and a dressing applied. FINDINGS: A total of approximately 800cc of bloody fluid was removed. Samples were sent to the laboratory as requested by the clinical team. IMPRESSION: Successful ultrasound guided left thoracentesis yielding 800cc of pleural fluid. Performed and dictated by Pasty Spillers, PA-C Electronically Signed   By: Lavonia Dana M.D.   On: 02/20/2022 12:35   DG Chest 1 View  Result Date: 02/20/2022 CLINICAL DATA:  [id number] Status post thoracentesis [id number] EXAM: CHEST  1 VIEW COMPARISON:  Same day chest x-ray FINDINGS: Stable cardiomediastinal contours. Near-complete resolution of previously seen left pleural effusion with improved aeration of the left lung base. Persistent small right pleural effusion. No pneumothorax. IMPRESSION: Near-complete resolution of previously seen left pleural effusion with improved aeration of the left lung base. No pneumothorax. Electronically Signed   By: Davina Poke D.O.   On: 02/20/2022 12:06   DG CHEST PORT 1 VIEW  Result Date: 02/20/2022 CLINICAL DATA:  Respiratory distress EXAM: PORTABLE CHEST 1 VIEW COMPARISON:  02/19/2022 FINDINGS: Stable cardiomediastinal contours. Aortic atherosclerosis. Pulmonary vascular congestion with diffusely prominent interstitial markings bilaterally. Persistent small right pleural effusion. Moderate-large left-sided pleural effusion has increased from prior with worsening aeration at the left lung base. No pneumothorax. IMPRESSION: 1. Moderate-large left-sided pleural effusion has increased from prior with worsening aeration at the left lung base. 2. Persistent small right pleural  effusion. Electronically Signed   By: Davina Poke D.O.   On: 02/20/2022 09:50   DG Swallowing Func-Speech Pathology  Result Date: 02/19/2022 Table formatting from the original result was not included. Modified Barium Swallow Study Patient Details Name: TRINETTA FREYTES MRN: [id number] Date of Birth: 16-Jun-1955 Today's Date: 02/19/2022 HPI/PMH: HPI: 67 year old female with history of depression, anxiety, bipolar 1 disorder, chronic DVT, chronic hip pain, dementia, history of UTIs and hypothyroidism, previous intracerebral hematoma, history of hemorrhagic CVA, SIADH, DNR present on admission, history of rhabdomyolysis who apparently has been living alone but having care aides that are with her during the daytime however she remains alone at night. Historian reported that patient has been having frequent falls recently at home and gait instability which is worsened over the past 3 to 4 days.  She also is having intermittent confusion which is worse from her baseline.  She has had poor oral intake over the past several days according to caretakers. Pt found down on the floor at home by caregivers. Workup in the emergency department reveals that patient has severe acute kidney injury, she has a fractured right ankle and multiple rib fractures.  She also has evidence of an aspiration pneumonitis and she was started on IV fluids and IV antibiotics. BSE requested. Clinical Impression: Clinical Impression: Pt presents with mild/moderate oropharyngeal dysphagia that is negatively impacted by Tardive Dyskinesia (TD) which causes repetitive, writhing movements of her jaw and tongue. Pt's decreased oral coordination and bolus cohesion result in poor oral containment anteriorly and posteriorly; swallowing is triggered at the level of the pyriforms, trace to moderate pharyngeal residue of thin is aspirated in trace amounts; aspiration is sensed most of the time but aspirates were not expelled  from the airway. Note aspiration  does not occur with every thin presentation (not noted with very small controlled cup sips). Pt is physically unable to maintain chin tuck or consistently utilize any attempted compensatory strategies secondary to her TD. Despite decreased coordination and slow swallowing trigger at the pyriforms NTL, puree and regular are within functional limits with only trace residue on oral and pharyngeal structures with no penetration or aspiration visualized. Pt is at high risk for aspiration with thin liquids and suspect Pt has been compensating for her TD, however her current deconditioned Pt is frequently aspirating thin liquids in trace amounts. Recommend continue with D1/puree and downgrade Pt's liquids to NTL. Pt is a good candidate for free water protocol and recommend dysphagia therapy with repeat MBSS when clinically indicated. Factors that may increase risk of adverse event in presence of aspiration (Yoe 2021): Weak cough; Dependence for feeding and/or oral hygiene Recommendations/Plan: Swallowing Evaluation Recommendations Swallowing Evaluation Recommendations Recommendations: PO diet PO Diet Recommendation: Dysphagia 1 (Pureed); Mildly thick liquids (Level 2, nectar thick) Liquid Administration via: Cup; Straw Medication Administration: Crushed with puree Supervision: Full assist for feeding Swallowing strategies  : Slow rate; Small bites/sips; Follow solids with liquids; Clear throat intermittently Oral care recommendations: Oral care BID (2x/day); Oral care before ice chips/water Caregiver Recommendations: Avoid jello, ice cream, thin soups, popsicles; Remove water pitcher Treatment Plan Treatment Plan Follow-up recommendations: Skilled nursing-short term rehab (<3 hours/day) Functional status assessment: Patient has had a recent decline in their functional status and demonstrates the ability to make significant improvements in function in a reasonable and predictable amount of time. Treatment  frequency: Min 2x/week Treatment duration: 2 weeks Interventions: Aspiration precaution training; Oropharyngeal exercises; Compensatory techniques; Patient/family education; Trials of upgraded texture/liquids; Diet toleration management by SLP; Respiratory muscle strength training Recommendations Recommendations for follow up therapy are one component of a multi-disciplinary discharge planning process, led by the attending physician.  Recommendations may be updated based on patient status, additional functional criteria and insurance authorization. Assessment: Orofacial Exam: Orofacial Exam Oral Cavity: Oral Hygiene: Xerostomia Oral Cavity - Dentition: Edentulous Orofacial Anatomy: WFL Oral Motor/Sensory Function: WFL Anatomy: Anatomy: WFL Thin Liquids: Thin Liquids (Level 0) Thin Liquids : Impaired Bolus delivery method: Spoon; Cup Thin Liquid - Impairment: Oral Impairment; Pharyngeal impairment Lip Closure: Central anterior loss Tongue control during bolus hold: Escape to lateral buccal cavity/floor of mouth; Posterior escape of less than half of bolus Bolus transport/lingual motion: Delayed initiation of tongue motion (oral holding); Repetitive/disorganized tongue motion Oral residue: Trace residue lining oral structures Location of oral residue : Tongue; Floor of mouth; Palate Initiation of swallow : Pyriform sinuses Soft palate elevation: Complete Tongue base retraction: Trace column of contrast or air between tongue base and PPW Laryngeal elevation: Partial or minimal superior movement and approximation Anterior hyoid excursion: Partial Epiglottic movement: Complete Laryngeal vestibule closure: Incomplete Pharyngeal stripping wave : Present - diminished Pharyngeal contraction (A/P view only): N/A Pharyngoesophageal segment opening: Complete distension and complete duration, no obstruction of flow Pharyngeal residue: Collection of residue within or on pharyngeal structures Location of pharyngeal residue:  Valleculae; Pharyngeal wall; Pyriform sinuses; Diffuse (>3 areas) Penetration/Aspiration Scale (PAS) score: 7.  Material enters airway, passes BELOW cords and not ejected out despite cough attempt by patient  Mildly Thick Liquids: Mildly thick liquids (Level 2, nectar thick) Mildly thick liquids (Level 2, nectar thick): Impaired Bolus delivery method: Cup; Spoon Mildly Thick Liquid - Impairment: Oral Impairment; Pharyngeal impairment Lip Closure: Central anterior loss Tongue  control during bolus hold: Escape to lateral buccal cavity/floor of mouth; Posterior escape of less than half of bolus Bolus transport/lingual motion: Repetitive/disorganized tongue motion; Slow tongue motion Oral residue: Trace residue lining oral structures; Residue collection on oral structures Location of oral residue : Floor of mouth; Tongue; Palate Initiation of swallow : Pyriform sinuses Soft palate elevation: Complete Tongue base retraction: Trace column of contrast or air between tongue base and PPW; Narrow column of contrast or air between tongue base and PPW Laryngeal elevation: Complete superior movement of thyroid cartilage with complete approximation of arytenoids to epiglottic petiole Anterior hyoid excursion: Complete Epiglottic movement: Complete Laryngeal vestibule closure: Complete: No air/contrast in laryngeal vestibule Pharyngeal stripping wave : Present - diminished Pharyngeal contraction (A/P view only): N/A Pharyngoesophageal segment opening: Complete distension and complete duration, no obstruction of flow Pharyngeal residue: Collection of residue within or on pharyngeal structures Location of pharyngeal residue: Valleculae; Pharyngeal wall; Pyriform sinuses; Diffuse (>3 areas); Tongue base; Aryepiglottic folds Penetration/Aspiration Scale (PAS) score: 1.  Material does not enter airway  Moderately Thick Liquids: Moderately thick liquids (Level 3, honey thick) Moderately thick liquids (Level 3, honey thick): WFL  Puree:  Puree Puree: WFL Solid: Solid Solid: WFL Pill: Pill Pill: Not Tested Compensatory Strategies: Compensatory Strategies Compensatory strategies: Yes Chin tuck: Ineffective   General Information: Caregiver present: No  Diet Prior to this Study: Dysphagia 1 (pureed); Thin liquids (Level 0)   Temperature : Normal   Respiratory Status: WFL   Supplemental O2: None (Room air)   History of Recent Intubation: No  Behavior/Cognition: Alert; Cooperative; Pleasant mood Self-Feeding Abilities: Dependent for feeding Baseline vocal quality/speech: Normal Volitional Cough: Able to elicit Volitional Swallow: Unable to elicit No data recorded Goal Planning: Prognosis for improved oropharyngeal function: Fair Barriers to Reach Goals: Severity of deficits; Overall medical prognosis No data recorded Patient/Family Stated Goal: N/A Consulted and agree with results and recommendations: Patient; Nurse Pain: Pain Assessment Pain Assessment: 0-10 Pain Score: 10 Pain Location: Pointed to RLE Pain Descriptors / Indicators: Aching Pain Intervention(s): Limited activity within patient's tolerance End of Session: Start Time:SLP Start Time (ACUTE ONLY): 1430 Stop Time: SLP Stop Time (ACUTE ONLY): 1502 Time Calculation:SLP Time Calculation (min) (ACUTE ONLY): 32 min Charges: SLP Evaluations $ SLP Speech Visit: 1 Visit SLP Evaluations $BSS Swallow: 1 Procedure $MBS Swallow: 1 Procedure SLP visit diagnosis: SLP Visit Diagnosis: Dysphagia, unspecified (R13.10) Past Medical History: Past Medical History: Diagnosis Date  Anxiety   Bipolar 1 disorder (HCC)   Chronic deep vein thrombosis (DVT) of distal vein of lower extremity ([place]) 07/10/2017  Chronic hip pain   Depression   GI bleed   Headache(784.0)   History of UTI/bladder spams.   Hyperthyroidism   Hypokalemia 08/15/2017  Intracerebral hematoma ([place]) 07/04/2019  Ischemic stroke (HCC)   Neuromuscular disorder (HCC)   shaking of hands   Pneumonia of right lower lobe due to infectious organism 11/06/2019   Radial styloid fracture: right 03/06/2017  Rhabdomyolysis 06/07/2015  SIADH (syndrome of inappropriate ADH production) ([place]) 04/08/2017  Thyrotoxicosis, unspecified with thyrotoxic crisis or storm 01/15/2019 Past Surgical History: Past Surgical History: Procedure Laterality Date  COLONOSCOPY WITH PROPOFOL N/A 04/05/2017  Procedure: COLONOSCOPY WITH PROPOFOL;  Surgeon: Rogene Houston, MD;  [facility];  Service: Endoscopy;  Laterality: N/A;  INTRAMEDULLARY (IM) NAIL INTERTROCHANTERIC Right 03/07/2017  Procedure: OPEN TREATMENT INTERNAL FIXATION RIGHT HIP WITH GAMA INTRAMEDULARY NAIL;  Surgeon: Carole Civil, MD;  Location: [facility];  Service: Orthopedics;  Laterality: Right;  MULTIPLE EXTRACTIONS WITH ALVEOLOPLASTY N/A 10/30/2012  Procedure: MULTIPLE EXTRACION 5, 6, 8, 9, 10 ,18, 19, 31 WITH MAXILLARY RIGHT AND LEFT  ALVEOLOPLASTY REDUCE MAXILLARY LEFT TUBEROSITY;  Surgeon: Gae Bon, DDS;  Location: [facility];  Service: Oral Surgery;  Laterality: N/A;  POLYPECTOMY  04/05/2017  Procedure: POLYPECTOMY;  Surgeon: Rogene Houston, MD;  [facility];  Service: Endoscopy;;  recto-sigmoid, rectum Amelia H. Roddie Mc, CCC-SLP Speech Language Pathologist Wende Bushy 02/19/2022, 6:05 PM  DG CHEST PORT 1 VIEW  Result Date: 02/19/2022 CLINICAL DATA:  Acute respiratory distress EXAM: PORTABLE CHEST 1 VIEW COMPARISON:  08/12/2020 FINDINGS: Cardiomegaly. Aortic atherosclerosis. Interstitial pulmonary edema. Bilateral effusions with dependent atelectasis, left more than right. Findings most consistent with congestive heart failure. Coexistent pneumonia not excluded by radiography however. IMPRESSION: Congestive heart failure pattern with interstitial edema, bilateral effusions and dependent atelectasis, left more than right. Cannot exclude coexistent pneumonia. Electronically Signed   By: Nelson Chimes M.D.   On: 02/19/2022 16:29   ECHOCARDIOGRAM COMPLETE  Result Date: 02/19/2022     ECHOCARDIOGRAM REPORT   Patient Name:   ALYONA KANU Date of Exam: 02/19/2022 Medical Rec #:  [id number]       Height:       65.0 in Accession #:    [id number]      Weight:       144.0 lb Date of Birth:  August 16, 1955       BSA:          1.720 m Patient Age:    19 years        BP:           131/92 mmHg Patient Gender: F               HR:           90 bpm. Exam Location:  [facility] Procedure: 2D Echo, Cardiac Doppler and Color Doppler Indications:    CAD Native Vessel I25.10                 Dyspnea R06.00  History:        Patient has no prior history of Echocardiogram examinations.                 Stroke, Signs/Symptoms:Altered Mental Status; Risk                 Factors:Current Smoker.  Sonographer:    Greer Pickerel Referring Phys: [id number] Murlean Iba  Sonographer Comments: Image acquisition challenging due to patient body habitus and Image acquisition challenging due to respiratory motion. IMPRESSIONS  1. Left ventricular ejection fraction, by estimation, is 70 to 75%. The left ventricle has hyperdynamic function. Left ventricular endocardial border not optimally defined to evaluate regional wall motion. Left ventricular diastolic parameters are indeterminate. Elevated left atrial pressure.  2. Right ventricular systolic function was not well visualized. The right ventricular size is not well visualized.  3. A small pericardial effusion is present. The pericardial effusion is circumferential. There is no evidence of cardiac tamponade.  4. The mitral valve is normal in structure. No evidence of mitral valve regurgitation. No evidence of mitral stenosis.  5. Tricuspid valve regurgitation is mild to moderate.  6. The aortic valve is tricuspid. There is mild calcification of the aortic valve. There is mild thickening of the aortic valve. Aortic valve regurgitation is not visualized. No aortic stenosis is present.  7. The inferior vena cava is dilated in size with <  50% respiratory variability, suggesting right atrial  pressure of 15 mmHg. FINDINGS  Left Ventricle: Left ventricular ejection fraction, by estimation, is 70 to 75%. The left ventricle has hyperdynamic function. Left ventricular endocardial border not optimally defined to evaluate regional wall motion. The left ventricular internal cavity size was normal in size. There is no left ventricular hypertrophy. Left ventricular diastolic parameters are indeterminate. Elevated left atrial pressure. Right Ventricle: The right ventricular size is not well visualized. Right vetricular wall thickness was not well visualized. Right ventricular systolic function was not well visualized. Left Atrium: Left atrial size was not well visualized. Right Atrium: Right atrial size was not well visualized. Pericardium: A small pericardial effusion is present. The pericardial effusion is circumferential. There is no evidence of cardiac tamponade. Mitral Valve: The mitral valve is normal in structure. No evidence of mitral valve regurgitation. No evidence of mitral valve stenosis. Tricuspid Valve: The tricuspid valve is not well visualized. Tricuspid valve regurgitation is mild to moderate. Aortic Valve: The aortic valve is tricuspid. There is mild calcification of the aortic valve. There is mild thickening of the aortic valve. There is mild aortic valve annular calcification. Aortic valve regurgitation is not visualized. No aortic stenosis  is present. Aortic valve mean gradient measures 5.4 mmHg. Aortic valve peak gradient measures 12.1 mmHg. Aortic valve area, by VTI measures 2.49 cm. Pulmonic Valve: The pulmonic valve was not well visualized. Pulmonic valve regurgitation is not visualized. No evidence of pulmonic stenosis. Aorta: The aortic root is normal in size and structure. Venous: The inferior vena cava is dilated in size with less than 50% respiratory variability, suggesting right atrial pressure of 15 mmHg. IAS/Shunts: The interatrial septum was not well visualized.  LEFT VENTRICLE  PLAX 2D LVIDd:         3.20 cm   Diastology LVIDs:         1.80 cm   LV e' medial:    5.91 cm/s LV PW:         1.00 cm   LV E/e' medial:  16.1 LV IVS:        0.90 cm   LV e' lateral:   7.15 cm/s LVOT diam:     1.90 cm   LV E/e' lateral: 13.3 LV SV:         82 LV SV Index:   48 LVOT Area:     2.84 cm  RIGHT VENTRICLE RV S prime:     8.83 cm/s TAPSE (M-mode): 1.2 cm LEFT ATRIUM           Index        RIGHT ATRIUM           Index LA diam:      3.30 cm 1.92 cm/m   RA Area:     15.40 cm LA Vol (A4C): 36.9 ml 21.45 ml/m  RA Volume:   41.60 ml  24.18 ml/m  AORTIC VALVE AV Area (Vmax):    2.17 cm AV Area (Vmean):   2.37 cm AV Area (VTI):     2.49 cm AV Vmax:           173.74 cm/s AV Vmean:          106.109 cm/s AV VTI:            0.329 m AV Peak Grad:      12.1 mmHg AV Mean Grad:      5.4 mmHg LVOT Vmax:         133.00 cm/s  LVOT Vmean:        88.600 cm/s LVOT VTI:          0.289 m LVOT/AV VTI ratio: 0.88  AORTA Ao Root diam: 3.80 cm MITRAL VALVE MV Area (PHT): 2.91 cm     SHUNTS MV Decel Time: 261 msec     Systemic VTI:  0.29 m MV E velocity: 95.20 cm/s   Systemic Diam: 1.90 cm MV A velocity: 103.00 cm/s MV E/A ratio:  0.92 Carlyle Dolly MD Electronically signed by Carlyle Dolly MD Signature Date/Time: 02/19/2022/11:58:45 AM    Final    CT Head Wo Contrast  Result Date: 02/15/2022 CLINICAL DATA:  Trauma EXAM: CT HEAD WITHOUT CONTRAST CT CERVICAL SPINE WITHOUT CONTRAST TECHNIQUE: Multidetector CT imaging of the head and cervical spine was performed following the standard protocol without intravenous contrast. Multiplanar CT image reconstructions of the cervical spine were also generated. RADIATION DOSE REDUCTION: This exam was performed according to the departmental dose-optimization program which includes automated exposure control, adjustment of the mA and/or kV according to patient size and/or use of iterative reconstruction technique. COMPARISON:  CT C Spine 08/02/19, CT head 08/12/20 FINDINGS: CT HEAD  FINDINGS Brain: No evidence of acute infarction, hemorrhage, hydrocephalus, extra-axial collection or mass lesion/mass effect. Redemonstrated is chronic infarct in the anterior right temporal lobe with mild ex vacuo dilatation of the right temporal horn. 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 Vascular: No hyperdense vessel or unexpected calcification. Skull: Normal. Negative for fracture or focal lesion. Sinuses/Orbits: No middle ear or mastoid effusion. Paranasal sinuses are clear. Orbits are unremarkable. Other: None. CT CERVICAL SPINE FINDINGS Alignment: Trace anterolisthesis of C3 on C4, C4 on C5. Skull base and vertebrae: No acute fracture. No primary bone lesion or focal pathologic process. Disc space loss at C6-C7 has slightly progressed from prior exam. Soft tissues and spinal canal: No prevertebral fluid or swelling. No visible canal hematoma. Disc levels: No CT evidence of high-grade spinal canal stenosis. There is multilevel severe degenerative facet disease resulting in moderate neural foraminal stenosis at C3-C4 on the right and severe neural foraminal stenosis at C4-C5 bilaterally. Upper chest: See separately dictated CT chest for additional findings. Severe centrilobular emphysema. Other: None IMPRESSION: CT HEAD: 1. No acute intracranial abnormality. 2. 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 CT CERVICAL SPINE: No acute fracture or traumatic malalignment of the cervical spine. Emphysema (ICD10-J43.9). Electronically Signed   By: Marin Roberts M.D.   On: 02/15/2022 14:04   CT Cervical Spine Wo Contrast  Result Date: 02/15/2022 CLINICAL DATA:  Trauma EXAM: CT HEAD WITHOUT CONTRAST CT CERVICAL SPINE WITHOUT CONTRAST TECHNIQUE: Multidetector CT imaging of the head and cervical spine was performed following the standard protocol without intravenous contrast. Multiplanar CT image reconstructions of the cervical spine were also  generated. RADIATION DOSE REDUCTION: This exam was performed according to the departmental dose-optimization program which includes automated exposure control, adjustment of the mA and/or kV according to patient size and/or use of iterative reconstruction technique. COMPARISON:  CT C Spine 08/02/19, CT head 08/12/20 FINDINGS: CT HEAD FINDINGS Brain: No evidence of acute infarction, hemorrhage, hydrocephalus, extra-axial collection or mass lesion/mass effect. Redemonstrated is chronic infarct in the anterior right temporal lobe with mild ex vacuo dilatation of the right temporal horn. 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 Vascular: No hyperdense vessel or unexpected calcification. Skull: Normal. Negative for fracture or focal lesion. Sinuses/Orbits: No middle ear or  mastoid effusion. Paranasal sinuses are clear. Orbits are unremarkable. Other: None. CT CERVICAL SPINE FINDINGS Alignment: Trace anterolisthesis of C3 on C4, C4 on C5. Skull base and vertebrae: No acute fracture. No primary bone lesion or focal pathologic process. Disc space loss at C6-C7 has slightly progressed from prior exam. Soft tissues and spinal canal: No prevertebral fluid or swelling. No visible canal hematoma. Disc levels: No CT evidence of high-grade spinal canal stenosis. There is multilevel severe degenerative facet disease resulting in moderate neural foraminal stenosis at C3-C4 on the right and severe neural foraminal stenosis at C4-C5 bilaterally. Upper chest: See separately dictated CT chest for additional findings. Severe centrilobular emphysema. Other: None IMPRESSION: CT HEAD: 1. No acute intracranial abnormality. 2. 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 CT CERVICAL SPINE: No acute fracture or traumatic malalignment of the cervical spine. Emphysema (ICD10-J43.9). Electronically Signed   By: Marin Roberts M.D.   On: 02/15/2022 14:04   CT Chest Wo  Contrast  Result Date: 02/15/2022 CLINICAL DATA:  LEFT chest wall trauma. EXAM: CT CHEST WITHOUT CONTRAST TECHNIQUE: Multidetector CT imaging of the chest was performed following the standard protocol without IV contrast. RADIATION DOSE REDUCTION: This exam was performed according to the departmental dose-optimization program which includes automated exposure control, adjustment of the mA and/or kV according to patient size and/or use of iterative reconstruction technique. COMPARISON:  None Available. FINDINGS: Cardiovascular: No thoracic aortic aneurysm. Aortic atherosclerosis. Three-vessel coronary artery calcifications, particularly dense within the LEFT anterior descending coronary artery. No clinically significant pericardial effusion. Mediastinum/Nodes: No hemorrhage or edema is seen within the mediastinum. No mass or enlarged lymph nodes are seen within the mediastinum. Esophagus is unremarkable. Trachea is unremarkable. Lungs/Pleura: Paraseptal and centrilobular emphysematous changes bilaterally, upper lobe predominant. Bibasilar consolidations, LEFT greater than RIGHT. Small bilateral pleural effusions. No pneumothorax. Upper Abdomen: Limited images of the upper abdomen are unremarkable. Musculoskeletal: Minimally displaced fractures involving the posterior and posterior-lateral segments of the LEFT eleventh rib. Additional slightly displaced fracture of the posterior-lateral LEFT tenth rib and LEFT lateral ninth rib. Chronic wedge compression deformity of the L1 vertebral body. Degenerative spondylosis of the thoracic spine, mild to moderate in degree. IMPRESSION: 1. Minimally displaced to slightly displaced fractures of the LEFT ninth through eleventh ribs, as detailed above. 2. Bibasilar consolidations, LEFT greater than RIGHT, most likely a combination of atelectasis and pneumonia or aspiration. Favor at least some component of aspiration given the recent trauma. 3. Small bilateral pleural effusions.  No pneumothorax. 4. Three-vessel coronary artery calcifications, particularly dense within the LEFT anterior descending coronary artery. Recommend correlation with any possible associated cardiac symptoms. 5. Chronic wedge compression deformity of the L1 vertebral body. Emphysema (ICD10-J43.9). Electronically Signed   By: Franki Cabot M.D.   On: 02/15/2022 14:03   DG Ankle Complete Right  Result Date: 02/15/2022 CLINICAL DATA:  Right ankle pain, status post fall EXAM: RIGHT ANKLE - COMPLETE 3+ VIEW COMPARISON:  08/11/2020 ankle radiographs FINDINGS: Comminuted, mildly displaced and impacted fracture of the distal fibula. Comminuted, mildly displaced and distracted fracture through the medial malleolus. Soft tissue swelling about the ankle. IMPRESSION: Comminuted, mildly displaced and impacted fractures through the distal fibula and medial malleolus. Electronically Signed   By: Merilyn Baba M.D.   On: 02/15/2022 13:09    Orson Eva, DO  [facility]  If 7PM-7AM, please contact night-coverage [email or web address] Password [email or web address] 02/22/2022, 6:02 PM   LOS: 7 days

## 2022-02-22 NOTE — Care Management Important Message (Signed)
Important Message  Patient Details  Name: Cassidy Smith MRN: [id number] Date of Birth: January 17, 1955   Medicare Important Message Given:  Yes     Tommy Medal 02/22/2022, 10:32 AM

## 2022-02-22 NOTE — Plan of Care (Signed)
  Problem: Education: Goal: Knowledge of General Education information will improve Description Including pain rating scale, medication(s)/side effects and non-pharmacologic comfort measures Outcome: Progressing   Problem: Health Behavior/Discharge Planning: Goal: Ability to manage health-related needs will improve Outcome: Progressing   

## 2022-02-22 NOTE — Progress Notes (Signed)
   02/22/22 1000  ReDS Vest / Clip  Station Marker A  Ruler Value 25  ReDS Value Range < 36  ReDS Actual Value 32

## 2022-02-22 NOTE — Progress Notes (Signed)
Physical Therapy Treatment Patient Details Name: Cassidy Smith MRN: [id number] DOB: 1955-09-20 Today's Date: 02/22/2022   History of Present Illness patient has been having frequent falls recently at home and gait instability which is worsened over the past 3 to 4 days.  She also is having intermittent confusion which is worse from her baseline.  She has had poor oral intake over the past several days according to caretakers.  Patient denies having fever and chills.  Patient denies chest pain and reports having cough and chest congestion.     Patient was found down this morning by her care aides.  She apparently had fallen sometime in the middle of the night.  She had noticeable bruising and swelling to her right ankle and she hit her head.  She has bruising on her left rib cage.  Patient normally ambulates with a walker.  Family reports that patient has not been at her baseline mentation for the past several days.     Workup in the emergency department reveals that patient has severe acute kidney injury, she has a fractured right ankle and multiple rib fractures.  She also has evidence of an aspiration pneumonitis and she was started on IV fluids and IV antibiotics.  Admission was requested for further management.    PT Comments    Patient limited with bed mobility due to generalized weakness requiring assist for RLE mobility to EOB and to pull to seated. She demonstrates good sitting balance and sitting tolerance at bedside. After several attempts to transition to chair at bedside she ultimately ends up transferring to [facility]  with assist. She does well maintaining NWB status on RLE after cueing. RN assisted with cleaning patient and patient returned to bed at end of session. Patient will benefit from continued skilled physical therapy in hospital and recommended venue below to increase strength, balance, endurance for safe ADLs and gait.   Recommendations for follow up therapy are one component of a  multi-disciplinary discharge planning process, led by the attending physician.  Recommendations may be updated based on patient status, additional functional criteria and insurance authorization.  Follow Up Recommendations  Skilled nursing-short term rehab (<3 hours/day) Can patient physically be transported by private vehicle: No   Assistance Recommended at Discharge Frequent or constant Supervision/Assistance  Patient can return home with the following A lot of help with walking and/or transfers;A lot of help with bathing/dressing/bathroom;Assistance with cooking/housework   Equipment Recommendations  None recommended by PT    Recommendations for Other Services       Precautions / Restrictions Precautions Precautions: Fall Restrictions Weight Bearing Restrictions: Yes RLE Weight Bearing: Non weight bearing Other Position/Activity Restrictions: RLE     Mobility  Bed Mobility Overal bed mobility: Needs Assistance Bed Mobility: Supine to Sit, Sit to Supine     Supine to sit: Min assist, Mod assist Sit to supine: Min guard   General bed mobility comments: assist for RLE to EOB and to pull rest of way to seated    Transfers Overall transfer level: Needs assistance Equipment used: Rolling walker (2 wheels) Transfers: Sit to/from Stand, Bed to chair/wheelchair/BSC Sit to Stand: Min assist, Mod assist Stand pivot transfers: Min assist, Mod assist Step pivot transfers: Min assist, Mod assist       General transfer comment: initial STS limited by unsteadiness requiring rest break, second STS limited by dizziness requiring another rest break, was going to attempt transfer to chair at bedside but patient needing to use BSC so assisted  to and back to bed    Ambulation/Gait Ambulation/Gait assistance: Mod assist Gait Distance (Feet): 2 Feet Assistive device: Rolling walker (2 wheels) Gait Pattern/deviations: Step-to pattern, Decreased step length - left, Decreased step length  - right Gait velocity: decreased     General Gait Details: cueing for NWB assist for weakness/balance   Stairs             Wheelchair Mobility    Modified Rankin (Stroke Patients Only)       Balance Overall balance assessment: Needs assistance Sitting-balance support: Bilateral upper extremity supported, Feet supported Sitting balance-Leahy Scale: Fair   Postural control: Posterior lean Standing balance support: Bilateral upper extremity supported, During functional activity, Reliant on assistive device for balance Standing balance-Leahy Scale: Fair Standing balance comment: with RW                            Cognition Arousal/Alertness: Awake/alert Behavior During Therapy: WFL for tasks assessed/performed Overall Cognitive Status: Within Functional Limits for tasks assessed                                          Exercises General Exercises - Lower Extremity Ankle Circles/Pumps: AROM, Both, 20 reps, Seated Long Arc Quad: AROM, Both, 20 reps, Seated Hip Flexion/Marching: AROM, Both, 20 reps, Seated    General Comments        Pertinent Vitals/Pain Pain Assessment Pain Assessment: Faces Faces Pain Scale: Hurts little more Pain Location: RLE Pain Descriptors / Indicators: Aching Pain Intervention(s): Limited activity within patient's tolerance, Monitored during session, Repositioned    Home Living                          Prior Function            PT Goals (current goals can now be found in the care plan section) Progress towards PT goals: Progressing toward goals    Frequency    Min 4X/week      PT Plan Current plan remains appropriate    Co-evaluation              AM-PAC PT "6 Clicks" Mobility   Outcome Measure  Help needed turning from your back to your side while in a flat bed without using bedrails?: A Little Help needed moving from lying on your back to sitting on the side of a flat bed  without using bedrails?: A Little Help needed moving to and from a bed to a chair (including a wheelchair)?: A Lot Help needed standing up from a chair using your arms (e.g., wheelchair or bedside chair)?: A Little Help needed to walk in hospital room?: A Lot Help needed climbing 3-5 steps with a railing? : Total 6 Click Score: 14    End of Session Equipment Utilized During Treatment: Gait belt Activity Tolerance: Patient tolerated treatment well;Patient limited by fatigue Patient left: in bed;with nursing/sitter in room;with call bell/phone within reach Nurse Communication: Mobility status PT Visit Diagnosis: Unsteadiness on feet (R26.81);Repeated falls (R29.6);Muscle weakness (generalized) (M62.81);History of falling (Z91.81)     Time: [id number] PT Time Calculation (min) (ACUTE ONLY): 29 min  Charges:  $Therapeutic Exercise: 8-22 mins $Therapeutic Activity: 8-22 mins                     2:52 PM,  02/22/22 Mearl Latin PT, DPT Physical Therapist at [facility]

## 2022-02-22 NOTE — TOC Progression Note (Signed)
Transition of Care [facility]) - Progression Note    Patient Details  Name: NAILA AXT MRN: [id number] Date of Birth: April 19, 1955  Transition of Care [facility]) CM/SW [place], [place] Phone Number: 02/22/2022, 11:00 AM  Clinical Narrative:    CSW confirmed with Rachel Moulds in admissions at [facility] that pt can arrive to facility tomorrow for admission if she is medically ready for D/C and as long as she arrives before 8pm. TOC to follow.   Expected Discharge Plan: [place] Barriers to Discharge: Continued Medical Work up  Expected Discharge Plan and Services                                               Social Determinants of Health (SDOH) Interventions SDOH Screenings   Food Insecurity: No Food Insecurity (02/15/2022)  Housing: Low Risk  (02/15/2022)  Transportation Needs: No Transportation Needs (02/15/2022)  Utilities: Not At Risk (02/15/2022)  Tobacco Use: Medium Risk (02/15/2022)    Readmission Risk Interventions    07/26/2020   12:20 PM  Readmission Risk Prevention Plan  Transportation Screening Complete  Home Care Screening Complete  Medication Review (RN CM) Complete

## 2022-02-23 ENCOUNTER — Inpatient Hospital Stay (HOSPITAL_COMMUNITY): Payer: Medicare Other

## 2022-02-23 DIAGNOSIS — F0390 Unspecified dementia without behavioral disturbance: Secondary | ICD-10-CM | POA: Diagnosis not present

## 2022-02-23 DIAGNOSIS — J690 Pneumonitis due to inhalation of food and vomit: Secondary | ICD-10-CM | POA: Diagnosis not present

## 2022-02-23 DIAGNOSIS — I5031 Acute diastolic (congestive) heart failure: Secondary | ICD-10-CM | POA: Diagnosis not present

## 2022-02-23 DIAGNOSIS — N179 Acute kidney failure, unspecified: Secondary | ICD-10-CM | POA: Diagnosis not present

## 2022-02-23 LAB — CBC
HCT: 26 % — ABNORMAL LOW (ref 36.0–46.0)
Hemoglobin: 8.4 g/dL — ABNORMAL LOW (ref 12.0–15.0)
MCH: 32.3 pg (ref 26.0–34.0)
MCHC: 32.3 g/dL (ref 30.0–36.0)
MCV: 100 fL (ref 80.0–100.0)
Platelets: 111 10*3/uL — ABNORMAL LOW (ref 150–400)
RBC: 2.6 MIL/uL — ABNORMAL LOW (ref 3.87–5.11)
RDW: 19.9 % — ABNORMAL HIGH (ref 11.5–15.5)
WBC: 6.5 10*3/uL (ref 4.0–10.5)
nRBC: 0 % (ref 0.0–0.2)

## 2022-02-23 LAB — BASIC METABOLIC PANEL
Anion gap: 8 (ref 5–15)
BUN: 14 mg/dL (ref 8–23)
CO2: 38 mmol/L — ABNORMAL HIGH (ref 22–32)
Calcium: 8.8 mg/dL — ABNORMAL LOW (ref 8.9–10.3)
Chloride: 93 mmol/L — ABNORMAL LOW (ref 98–111)
Creatinine, Ser: 0.7 mg/dL (ref 0.44–1.00)
GFR, Estimated: >60 mL/min (ref >60)
Glucose, Bld: 93 mg/dL (ref 70–99)
Potassium: 4.3 mmol/L (ref 3.5–5.1)
Sodium: 139 mmol/L (ref 135–145)

## 2022-02-23 LAB — MAGNESIUM: Magnesium: 1.5 mg/dL — ABNORMAL LOW (ref 1.7–2.4)

## 2022-02-23 MED ORDER — FUROSEMIDE 40 MG PO TABS
40.0000 mg | ORAL_TABLET | Freq: Every day | ORAL | Status: DC
  Administered 2022-02-24: 40 mg via ORAL
  Filled 2022-02-23: qty 1

## 2022-02-23 MED ORDER — MAGNESIUM SULFATE 2 GM/50ML IV SOLN
2.0000 g | Freq: Once | INTRAVENOUS | Status: AC
  Administered 2022-02-23: 2 g via INTRAVENOUS
  Filled 2022-02-23: qty 50

## 2022-02-23 NOTE — TOC Progression Note (Signed)
Transition of Care [facility]) - Progression Note    Patient Details  Name: Cassidy Smith MRN: [id number] Date of Birth: 01-May-1955  Transition of Care [facility]) CM/SW [place], [place] Phone Number: 02/23/2022, 10:43 AM  Clinical Narrative:    CSW spoke to [place] in admissions at [facility] who states pt can admit to facility tomorrow if needed. TOC to follow.   Expected Discharge Plan: [place] Barriers to Discharge: Continued Medical Work up  Expected Discharge Plan and Services                                               Social Determinants of Health (SDOH) Interventions SDOH Screenings   Food Insecurity: No Food Insecurity (02/15/2022)  Housing: Low Risk  (02/15/2022)  Transportation Needs: No Transportation Needs (02/15/2022)  Utilities: Not At Risk (02/15/2022)  Tobacco Use: Medium Risk (02/15/2022)    Readmission Risk Interventions    07/26/2020   12:20 PM  Readmission Risk Prevention Plan  Transportation Screening Complete  Home Care Screening Complete  Medication Review (RN CM) Complete

## 2022-02-23 NOTE — Progress Notes (Signed)
   02/23/22 1038  ReDS Vest / Clip  Station Marker A  Ruler Value 27  ReDS Value Range < 36  ReDS Actual Value 20

## 2022-02-23 NOTE — Progress Notes (Signed)
PROGRESS NOTE  Cassidy Smith [id number] DOB: 04/09/1955 DOA: 02/15/2022 PCP: Neale Burly, MD  Brief History:  67 year old female with history of depression, anxiety, bipolar 1 disorder, chronic DVT, chronic hip pain, dementia, history of UTIs and hypothyroidism, previous intracerebral hematoma, history of hemorrhagic CVA, SIADH, DNR present on admission, history of rhabdomyolysis who apparently has been living alone but having care aides that are with her during the daytime however she remains alone at night.  Historian reported that patient has been having frequent falls recently at home and gait instability which is worsened over the past 3 to 4 days.  She also is having intermittent confusion which is worse from her baseline.  She has had poor oral intake over the past several days according to caretakers.  Patient denies having fever and chills.  Patient denies chest pain and reports having cough and chest congestion.  Patient was found down this morning by her care aides.  She apparently had fallen sometime in the middle of the night.  She had noticeable bruising and swelling to her right ankle and she hit her head.  She has bruising on her left rib cage.  Patient normally ambulates with a walker.  Family reports that patient has not been at her baseline mentation for the past several days.  Workup in the emergency department reveals that patient has severe acute kidney injury, she has a fractured right ankle and multiple rib fractures.  She also has evidence of an aspiration pneumonitis and she was started on IV fluids and IV antibiotics.  Admission was requested for further management.     Assessment/Plan: AKI- treated and resolved  Prerenal secondary to severe dehydration Initially fluid resuscitated Renally dose medications Follow daily basic metabolic panel Creatinine improved to 2.27 >> 0.64   Aspiration pneumonia Had 5 days ceftriaxone and azithro--finished  2/6 Dysphagia diet ordered with aspiration precautions Supplemental oxygen as needed SLP eval requested    Acute respiratory distress /Acute respiratory failure with hypoxia - stop IV fluids  - started lasix IV--increased to BID - 2/9 ReDS--32 - 2/7 personally reviewed CXR--pulm edema, bilateral pleural effusion L>R - 2/7 Left side thora--800cc removed>>transudate -now stable on 4-5L -aim for oxygen saturation 88-92% -repeat CXR 2/10   Acute HFpEF -2/6 echo--EF 70-75%, mid-mod TR, small pericardial eff -start daily IV lasix--increase to bid -accurate I/O--1700 out last 24 hour -daily weights - 2/7 Left side thora--800cc removed>>transudate   Dysphagia (severe) NPO initially SLP now recommending Dys 1 diet with NTL Aspiration precautions   Leukocytosis - resolved  Secondary to pneumonia and aspiration Resolved    Right ankle fracture Discussed with orthopedist Dr. Amedeo Kinsman who recommends splinting and outpatient follow-up with orthopedics Pain management as ordered NWB to RLE until ok with orthopedics Appreciate ortho consult, she will remain NWB and outpatient follow up in 2 weeks Initially refused SNF>>now agreeable   Falls Frequently/High Fall Risk Fall precautions recommended PT evaluation requested and recommending SNF TOC consulted for SNF placement  Multiple discussions with son and he is adamant he wants pt at his home with [facility] services and he has arranged for 24/7 supervision     Bipolar 1 disorder Resumed home behavioral health medications Delirium precautions recommended   Tobacco user/COPD Nicotine patch ordered Cessation discussed -nearly 100 pack year history -continue duoneb and pulmicort   Hyperthyroidism Resumed home methimazole    DNR present on admission Advance care planning documents have been scanned into  the EMR and reviewed   Essential hypertension Diet controlled, following           Family Communication:   son updated 2/10    Consultants:  none   Code Status:  DNR   DVT Prophylaxis:  Java Heparin     Procedures: As Listed in Progress Note Above   Antibiotics: Ceftriaxone 2/2>>2/6 Azithro 2/2>>2/6          Subjective: Patient denies fevers, chills, headache, chest pain, dyspnea, nausea, vomiting, diarrhea, abdominal pain  Objective: Vitals:   02/22/22 2122 02/23/22 0545 02/23/22 0725 02/23/22 0730  BP: 120/65 132/66    Pulse: 87 83    Resp: 20 18    Temp: 99.1 F (37.3 C) 98.3 F (36.8 C)    TempSrc: Oral Oral    SpO2: 95% 97% 98% 98%  Weight:  61.2 kg    Height:        Intake/Output Summary (Last 24 hours) at 02/23/2022 1009 Last data filed at 02/22/2022 2122 Gross per 24 hour  Intake 240 ml  Output 1700 ml  Net -1460 ml   Weight change: -5.5 kg Exam:  General:  Pt is alert, follows commands appropriately, not in acute distress HEENT: No icterus, No thrush, No neck mass, /AT Cardiovascular: RRR, S1/S2, no rubs, no gallops Respiratory:bibasilar crackles, R>L Abdomen: Soft/+BS, non tender, non distended, no guarding Extremities: No edema, No lymphangitis, No petechiae, No rashes, no synovitis   Data Reviewed: I have personally reviewed following labs and imaging studies Basic Metabolic Panel: Recent Labs  Lab 02/19/22 0404 02/20/22 1018 02/21/22 0405 02/22/22 0402 02/23/22 0611  NA 140 143 141 141 139  K 4.4 3.8 3.8 3.7 4.3  CL 102 100 98 98 93*  CO2 34* 36* 35* 36* 38*  GLUCOSE 126* 108* 112* 87 93  BUN 27* 20 19 17 14  $ CREATININE 0.80 0.70 0.64 0.70 0.70  CALCIUM 8.9 9.4 8.9 8.9 8.8*  MG 1.9 1.5* 1.9 1.6* 1.5*   Liver Function Tests: Recent Labs  Lab 02/21/22 0405  AST 20  ALT 15  ALKPHOS 90  BILITOT 0.4  PROT 5.3*  ALBUMIN 2.0*   No results for input(s): "LIPASE", "AMYLASE" in the last 168 hours. No results for input(s): "AMMONIA" in the last 168 hours. Coagulation Profile: No results for input(s): "INR", "PROTIME" in the last 168  hours. CBC: Recent Labs  Lab 02/17/22 0348 02/20/22 1018 02/22/22 0402 02/23/22 0611  WBC 7.5 7.9 7.6 6.5  NEUTROABS 3.7  --   --   --   HGB 9.2* 9.0* 8.1* 8.4*  HCT 28.8* 26.4* 21.9* 26.0*  MCV 94.7 103.5* 100.9* 100.0  PLT 90* 52* 86* 111*   Cardiac Enzymes: No results for input(s): "CKTOTAL", "CKMB", "CKMBINDEX", "TROPONINI" in the last 168 hours. BNP: Invalid input(s): "POCBNP" CBG: No results for input(s): "GLUCAP" in the last 168 hours. HbA1C: No results for input(s): "HGBA1C" in the last 72 hours. Urine analysis:    Component Value Date/Time   COLORURINE YELLOW 08/12/2020 1339   APPEARANCEUR CLEAR 08/12/2020 1339   LABSPEC 1.015 08/12/2020 1339   PHURINE 8.0 08/12/2020 1339   GLUCOSEU NEGATIVE 08/12/2020 1339   HGBUR NEGATIVE 08/12/2020 1339   BILIRUBINUR NEGATIVE 08/12/2020 1339   KETONESUR 5 (A) 08/12/2020 1339   PROTEINUR NEGATIVE 08/12/2020 1339   UROBILINOGEN 0.2 05/12/2013 0210   NITRITE NEGATIVE 08/12/2020 1339   LEUKOCYTESUR NEGATIVE 08/12/2020 1339   Sepsis Labs: @LABRCNTIP$ (procalcitonin:4,lacticidven:4) ) Recent Results (from the past 240 hour(s))  Blood  culture (routine x 2)     Status: None   Collection Time: 02/15/22  3:32 PM   Specimen: BLOOD  Result Value Ref Range Status   Specimen Description BLOOD BLOOD RIGHT ARM  Final   Special Requests Blood Culture adequate volume  Final   Culture   Final    NO GROWTH 6 DAYS Performed at [facility], [address], [place], [place] [address]    Report Status 02/21/2022 FINAL  Final  Blood culture (routine x 2)     Status: None   Collection Time: 02/15/22  3:32 PM   Specimen: BLOOD  Result Value Ref Range Status   Specimen Description BLOOD BLOOD RIGHT HAND AEROBIC BOTTLE ONLY  Final   Special Requests Blood Culture adequate volume  Final   Culture   Final    NO GROWTH 6 DAYS Performed at [facility], [address], [place], [place] [address]    Report Status 02/21/2022 FINAL  Final  Resp  panel by RT-PCR (RSV, Flu A&B, Covid) Anterior Nasal Swab     Status: None   Collection Time: 02/20/22  9:45 AM   Specimen: Anterior Nasal Swab  Result Value Ref Range Status   SARS Coronavirus 2 by RT PCR NEGATIVE NEGATIVE Final    Comment: (NOTE) SARS-CoV-2 target nucleic acids are NOT DETECTED.  The SARS-CoV-2 RNA is generally detectable in upper respiratory specimens during the acute phase of infection. The lowest concentration of SARS-CoV-2 viral copies this assay can detect is 138 copies/mL. A negative result does not preclude SARS-Cov-2 infection and should not be used as the sole basis for treatment or other patient management decisions. A negative result may occur with  improper specimen collection/handling, submission of specimen other than nasopharyngeal swab, presence of viral mutation(s) within the areas targeted by this assay, and inadequate number of viral copies(<138 copies/mL). A negative result must be combined with clinical observations, patient history, and epidemiological information. The expected result is Negative.  Fact Sheet for Patients:  EntrepreneurPulse.com.au  Fact Sheet for Healthcare Providers:  IncredibleEmployment.be  This test is no t yet approved or cleared by the [place] FDA and  has been authorized for detection and/or diagnosis of SARS-CoV-2 by FDA under an Emergency Use Authorization (EUA). This EUA will remain  in effect (meaning this test can be used) for the duration of the COVID-19 declaration under Section 564(b)(1) of the Act, 21 U.S.C.section 360bbb-3(b)(1), unless the authorization is terminated  or revoked sooner.       Influenza A by PCR NEGATIVE NEGATIVE Final   Influenza B by PCR NEGATIVE NEGATIVE Final    Comment: (NOTE) The Xpert Xpress SARS-CoV-2/FLU/RSV plus assay is intended as an aid in the diagnosis of influenza from Nasopharyngeal swab specimens and should not be used as a  sole basis for treatment. Nasal washings and aspirates are unacceptable for Xpert Xpress SARS-CoV-2/FLU/RSV testing.  Fact Sheet for Patients: EntrepreneurPulse.com.au  Fact Sheet for Healthcare Providers: IncredibleEmployment.be  This test is not yet approved or cleared by the [place] FDA and has been authorized for detection and/or diagnosis of SARS-CoV-2 by FDA under an Emergency Use Authorization (EUA). This EUA will remain in effect (meaning this test can be used) for the duration of the COVID-19 declaration under Section 564(b)(1) of the Act, 21 U.S.C. section 360bbb-3(b)(1), unless the authorization is terminated or revoked.     Resp Syncytial Virus by PCR NEGATIVE NEGATIVE Final    Comment: (NOTE) Fact Sheet for Patients: EntrepreneurPulse.com.au  Fact  Sheet for Healthcare Providers: IncredibleEmployment.be  This test is not yet approved or cleared by the [place] and has been authorized for detection and/or diagnosis of SARS-CoV-2 by FDA under an Emergency Use Authorization (EUA). This EUA will remain in effect (meaning this test can be used) for the duration of the COVID-19 declaration under Section 564(b)(1) of the Act, 21 U.S.C. section 360bbb-3(b)(1), unless the authorization is terminated or revoked.  Performed at [facility], [address], [place], [place] [address]   Gram stain     Status: None   Collection Time: 02/20/22 11:30 AM   Specimen: Pleura  Result Value Ref Range Status   Specimen Description PLEURAL  Final   Special Requests LEFT CHEST  Final   Gram Stain   Final    NO ORGANISMS SEEN WBC PRESENT, PREDOMINANTLY MONONUCLEAR CYTOSPIN SMEAR Performed at [facility], [address], [place], [place] [address]    Report Status 02/20/2022 FINAL  Final  Culture, body fluid w Gram Stain-bottle     Status: None (Preliminary result)   Collection Time: 02/20/22 11:30  AM   Specimen: Pleura  Result Value Ref Range Status   Specimen Description PLEURAL LEFT CHEST  Final   Special Requests BOTTLES DRAWN AEROBIC AND ANAEROBIC 10CC  Final   Culture   Final    NO GROWTH 3 DAYS Performed at [facility], [address] [place], [place] [address]    Report Status PENDING  Incomplete     Scheduled Meds:  acetaminophen  650 mg Oral Q6H   Or   acetaminophen  650 mg Rectal Q6H   aspirin EC  81 mg Oral Q breakfast   atorvastatin  20 mg Oral q1800   benztropine  1 mg Oral BID   budesonide (PULMICORT) nebulizer solution  0.5 mg Nebulization BID   cyanocobalamin  1,000 mcg Oral Daily   divalproex  1,000 mg Oral QHS   feeding supplement  237 mL Oral BID BM   furosemide  40 mg Intravenous BID   gabapentin  100 mg Oral QHS   ipratropium-albuterol  3 mL Nebulization TID   lidocaine  1 patch Transdermal Q24H   magnesium oxide  400 mg Oral BID   methimazole  10 mg Oral QODAY   nicotine  21 mg Transdermal Daily   pantoprazole  40 mg Oral QHS   pneumococcal 20-valent conjugate vaccine  0.5 mL Intramuscular Tomorrow-1000   potassium chloride  20 mEq Oral Daily   traZODone  100 mg Oral QHS   Continuous Infusions:  magnesium sulfate bolus IVPB      Procedures/Studies: US THORACENTESIS ASP PLEURAL SPACE W/IMG GUIDE  Result Date: 02/20/2022 INDICATION: Dyspnea, pleural effusion after fall with rib fractures EXAM: ULTRASOUND GUIDED LEFT THORACENTESIS MEDICATIONS: None. COMPLICATIONS: None immediate. PROCEDURE: An ultrasound guided thoracentesis was thoroughly discussed with the patient and questions answered. The benefits, risks, alternatives and complications were also discussed. The patient understands and wishes to proceed with the procedure. Written consent was obtained. Ultrasound was performed to localize and mark an adequate pocket of fluid in the left chest. The area was then prepped and draped in the normal sterile fashion. 1% Lidocaine was used for local  anesthesia. Under ultrasound guidance a 8Fr catheter was introduced. Thoracentesis was performed. The catheter was removed and a dressing applied. FINDINGS: A total of approximately 800cc of bloody fluid was removed. Samples were sent to the laboratory as requested by the clinical team. IMPRESSION: Successful ultrasound guided left thoracentesis yielding 800cc  of pleural fluid. Performed and dictated by Pasty Spillers, PA-C Electronically Signed   By: Lavonia Dana M.D.   On: 02/20/2022 12:35   DG Chest 1 View  Result Date: 02/20/2022 CLINICAL DATA:  [id number] Status post thoracentesis [id number] EXAM: CHEST  1 VIEW COMPARISON:  Same day chest x-ray FINDINGS: Stable cardiomediastinal contours. Near-complete resolution of previously seen left pleural effusion with improved aeration of the left lung base. Persistent small right pleural effusion. No pneumothorax. IMPRESSION: Near-complete resolution of previously seen left pleural effusion with improved aeration of the left lung base. No pneumothorax. Electronically Signed   By: Davina Poke D.O.   On: 02/20/2022 12:06   DG CHEST PORT 1 VIEW  Result Date: 02/20/2022 CLINICAL DATA:  Respiratory distress EXAM: PORTABLE CHEST 1 VIEW COMPARISON:  02/19/2022 FINDINGS: Stable cardiomediastinal contours. Aortic atherosclerosis. Pulmonary vascular congestion with diffusely prominent interstitial markings bilaterally. Persistent small right pleural effusion. Moderate-large left-sided pleural effusion has increased from prior with worsening aeration at the left lung base. No pneumothorax. IMPRESSION: 1. Moderate-large left-sided pleural effusion has increased from prior with worsening aeration at the left lung base. 2. Persistent small right pleural effusion. Electronically Signed   By: Davina Poke D.O.   On: 02/20/2022 09:50   DG Swallowing Func-Speech Pathology  Result Date: 02/19/2022 Table formatting from the original result was not included. Modified Barium  Swallow Study Patient Details Name: Cassidy Smith MRN: [id number] Date of Birth: 04-01-1955 Today's Date: 02/19/2022 HPI/PMH: HPI: 67 year old female with history of depression, anxiety, bipolar 1 disorder, chronic DVT, chronic hip pain, dementia, history of UTIs and hypothyroidism, previous intracerebral hematoma, history of hemorrhagic CVA, SIADH, DNR present on admission, history of rhabdomyolysis who apparently has been living alone but having care aides that are with her during the daytime however she remains alone at night. Historian reported that patient has been having frequent falls recently at home and gait instability which is worsened over the past 3 to 4 days.  She also is having intermittent confusion which is worse from her baseline.  She has had poor oral intake over the past several days according to caretakers. Pt found down on the floor at home by caregivers. Workup in the emergency department reveals that patient has severe acute kidney injury, she has a fractured right ankle and multiple rib fractures.  She also has evidence of an aspiration pneumonitis and she was started on IV fluids and IV antibiotics. BSE requested. Clinical Impression: Clinical Impression: Pt presents with mild/moderate oropharyngeal dysphagia that is negatively impacted by Tardive Dyskinesia (TD) which causes repetitive, writhing movements of her jaw and tongue. Pt's decreased oral coordination and bolus cohesion result in poor oral containment anteriorly and posteriorly; swallowing is triggered at the level of the pyriforms, trace to moderate pharyngeal residue of thin is aspirated in trace amounts; aspiration is sensed most of the time but aspirates were not expelled from the airway. Note aspiration does not occur with every thin presentation (not noted with very small controlled cup sips). Pt is physically unable to maintain chin tuck or consistently utilize any attempted compensatory strategies secondary to her TD.  Despite decreased coordination and slow swallowing trigger at the pyriforms NTL, puree and regular are within functional limits with only trace residue on oral and pharyngeal structures with no penetration or aspiration visualized. Pt is at high risk for aspiration with thin liquids and suspect Pt has been compensating for her TD, however her current deconditioned Pt is frequently aspirating thin liquids in trace  amounts. Recommend continue with D1/puree and downgrade Pt's liquids to NTL. Pt is a good candidate for free water protocol and recommend dysphagia therapy with repeat MBSS when clinically indicated. Factors that may increase risk of adverse event in presence of aspiration ([place] 2021): Weak cough; Dependence for feeding and/or oral hygiene Recommendations/Plan: Swallowing Evaluation Recommendations Swallowing Evaluation Recommendations Recommendations: PO diet PO Diet Recommendation: Dysphagia 1 (Pureed); Mildly thick liquids (Level 2, nectar thick) Liquid Administration via: Cup; Straw Medication Administration: Crushed with puree Supervision: Full assist for feeding Swallowing strategies  : Slow rate; Small bites/sips; Follow solids with liquids; Clear throat intermittently Oral care recommendations: Oral care BID (2x/day); Oral care before ice chips/water Caregiver Recommendations: Avoid jello, ice cream, thin soups, popsicles; Remove water pitcher Treatment Plan Treatment Plan Follow-up recommendations: Skilled nursing-short term rehab (<3 hours/day) Functional status assessment: Patient has had a recent decline in their functional status and demonstrates the ability to make significant improvements in function in a reasonable and predictable amount of time. Treatment frequency: Min 2x/week Treatment duration: 2 weeks Interventions: Aspiration precaution training; Oropharyngeal exercises; Compensatory techniques; Patient/family education; Trials of upgraded texture/liquids; Diet toleration  management by SLP; Respiratory muscle strength training Recommendations Recommendations for follow up therapy are one component of a multi-disciplinary discharge planning process, led by the attending physician.  Recommendations may be updated based on patient status, additional functional criteria and insurance authorization. Assessment: Orofacial Exam: Orofacial Exam Oral Cavity: Oral Hygiene: Xerostomia Oral Cavity - Dentition: Edentulous Orofacial Anatomy: WFL Oral Motor/Sensory Function: WFL Anatomy: Anatomy: WFL Thin Liquids: Thin Liquids (Level 0) Thin Liquids : Impaired Bolus delivery method: Spoon; Cup Thin Liquid - Impairment: Oral Impairment; Pharyngeal impairment Lip Closure: Central anterior loss Tongue control during bolus hold: Escape to lateral buccal cavity/floor of mouth; Posterior escape of less than half of bolus Bolus transport/lingual motion: Delayed initiation of tongue motion (oral holding); Repetitive/disorganized tongue motion Oral residue: Trace residue lining oral structures Location of oral residue : Tongue; Floor of mouth; Palate Initiation of swallow : Pyriform sinuses Soft palate elevation: Complete Tongue base retraction: Trace column of contrast or air between tongue base and PPW Laryngeal elevation: Partial or minimal superior movement and approximation Anterior hyoid excursion: Partial Epiglottic movement: Complete Laryngeal vestibule closure: Incomplete Pharyngeal stripping wave : Present - diminished Pharyngeal contraction (A/P view only): N/A Pharyngoesophageal segment opening: Complete distension and complete duration, no obstruction of flow Pharyngeal residue: Collection of residue within or on pharyngeal structures Location of pharyngeal residue: Valleculae; Pharyngeal wall; Pyriform sinuses; Diffuse (>3 areas) Penetration/Aspiration Scale (PAS) score: 7.  Material enters airway, passes BELOW cords and not ejected out despite cough attempt by patient  Mildly Thick Liquids:  Mildly thick liquids (Level 2, nectar thick) Mildly thick liquids (Level 2, nectar thick): Impaired Bolus delivery method: Cup; Spoon Mildly Thick Liquid - Impairment: Oral Impairment; Pharyngeal impairment Lip Closure: Central anterior loss Tongue control during bolus hold: Escape to lateral buccal cavity/floor of mouth; Posterior escape of less than half of bolus Bolus transport/lingual motion: Repetitive/disorganized tongue motion; Slow tongue motion Oral residue: Trace residue lining oral structures; Residue collection on oral structures Location of oral residue : Floor of mouth; Tongue; Palate Initiation of swallow : Pyriform sinuses Soft palate elevation: Complete Tongue base retraction: Trace column of contrast or air between tongue base and PPW; Narrow column of contrast or air between tongue base and PPW Laryngeal elevation: Complete superior movement of thyroid cartilage with complete approximation of arytenoids to epiglottic petiole Anterior hyoid excursion:  Complete Epiglottic movement: Complete Laryngeal vestibule closure: Complete: No air/contrast in laryngeal vestibule Pharyngeal stripping wave : Present - diminished Pharyngeal contraction (A/P view only): N/A Pharyngoesophageal segment opening: Complete distension and complete duration, no obstruction of flow Pharyngeal residue: Collection of residue within or on pharyngeal structures Location of pharyngeal residue: Valleculae; Pharyngeal wall; Pyriform sinuses; Diffuse (>3 areas); Tongue base; Aryepiglottic folds Penetration/Aspiration Scale (PAS) score: 1.  Material does not enter airway  Moderately Thick Liquids: Moderately thick liquids (Level 3, honey thick) Moderately thick liquids (Level 3, honey thick): WFL  Puree: Puree Puree: WFL Solid: Solid Solid: WFL Pill: Pill Pill: Not Tested Compensatory Strategies: Compensatory Strategies Compensatory strategies: Yes Chin tuck: Ineffective   General Information: Caregiver present: No  Diet Prior to  this Study: Dysphagia 1 (pureed); Thin liquids (Level 0)   Temperature : Normal   Respiratory Status: WFL   Supplemental O2: None (Room air)   History of Recent Intubation: No  Behavior/Cognition: Alert; Cooperative; Pleasant mood Self-Feeding Abilities: Dependent for feeding Baseline vocal quality/speech: Normal Volitional Cough: Able to elicit Volitional Swallow: Unable to elicit No data recorded Goal Planning: Prognosis for improved oropharyngeal function: Fair Barriers to Reach Goals: Severity of deficits; Overall medical prognosis No data recorded Patient/Family Stated Goal: N/A Consulted and agree with results and recommendations: Patient; Nurse Pain: Pain Assessment Pain Assessment: 0-10 Pain Score: 10 Pain Location: Pointed to RLE Pain Descriptors / Indicators: Aching Pain Intervention(s): Limited activity within patient's tolerance End of Session: Start Time:SLP Start Time (ACUTE ONLY): 1430 Stop Time: SLP Stop Time (ACUTE ONLY): 1502 Time Calculation:SLP Time Calculation (min) (ACUTE ONLY): 32 min Charges: SLP Evaluations $ SLP Speech Visit: 1 Visit SLP Evaluations $BSS Swallow: 1 Procedure $MBS Swallow: 1 Procedure SLP visit diagnosis: SLP Visit Diagnosis: Dysphagia, unspecified (R13.10) Past Medical History: Past Medical History: Diagnosis Date  Anxiety   Bipolar 1 disorder (HCC)   Chronic deep vein thrombosis (DVT) of distal vein of lower extremity ([place]) 07/10/2017  Chronic hip pain   Depression   GI bleed   Headache(784.0)   History of UTI/bladder spams.   Hyperthyroidism   Hypokalemia 08/15/2017  Intracerebral hematoma ([place]) 07/04/2019  Ischemic stroke (HCC)   Neuromuscular disorder (HCC)   shaking of hands   Pneumonia of right lower lobe due to infectious organism 11/06/2019  Radial styloid fracture: right 03/06/2017  Rhabdomyolysis 06/07/2015  SIADH (syndrome of inappropriate ADH production) ([place]) 04/08/2017  Thyrotoxicosis, unspecified with thyrotoxic crisis or storm 01/15/2019 Past Surgical  History: Past Surgical History: Procedure Laterality Date  COLONOSCOPY WITH PROPOFOL N/A 04/05/2017  Procedure: COLONOSCOPY WITH PROPOFOL;  Surgeon: Rogene Houston, MD;  [facility];  Service: Endoscopy;  Laterality: N/A;  INTRAMEDULLARY (IM) NAIL INTERTROCHANTERIC Right 03/07/2017  Procedure: OPEN TREATMENT INTERNAL FIXATION RIGHT HIP WITH GAMA INTRAMEDULARY NAIL;  Surgeon: Carole Civil, MD;  Location: [facility];  Service: Orthopedics;  Laterality: Right;  MULTIPLE EXTRACTIONS WITH ALVEOLOPLASTY N/A 10/30/2012  Procedure: MULTIPLE EXTRACION 5, 6, 8, 9, 10 ,18, 19, 31 WITH MAXILLARY RIGHT AND LEFT  ALVEOLOPLASTY REDUCE MAXILLARY LEFT TUBEROSITY;  Surgeon: Gae Bon, DDS;  Location: [facility];  Service: Oral Surgery;  Laterality: N/A;  POLYPECTOMY  04/05/2017  Procedure: POLYPECTOMY;  Surgeon: Rogene Houston, MD;  [facility];  Service: Endoscopy;;  recto-sigmoid, rectum Amelia H. Roddie Mc, CCC-SLP Speech Language Pathologist Wende Bushy 02/19/2022, 6:05 PM  DG CHEST PORT 1 VIEW  Result Date: 02/19/2022 CLINICAL DATA:  Acute respiratory distress EXAM: PORTABLE CHEST 1  VIEW COMPARISON:  08/12/2020 FINDINGS: Cardiomegaly. Aortic atherosclerosis. Interstitial pulmonary edema. Bilateral effusions with dependent atelectasis, left more than right. Findings most consistent with congestive heart failure. Coexistent pneumonia not excluded by radiography however. IMPRESSION: Congestive heart failure pattern with interstitial edema, bilateral effusions and dependent atelectasis, left more than right. Cannot exclude coexistent pneumonia. Electronically Signed   By: Nelson Chimes M.D.   On: 02/19/2022 16:29   ECHOCARDIOGRAM COMPLETE  Result Date: 02/19/2022    ECHOCARDIOGRAM REPORT   Patient Name:   Cassidy Smith Date of Exam: 02/19/2022 Medical Rec #:  [id number]       Height:       65.0 in Accession #:    [id number]      Weight:       144.0 lb Date of Birth:  [birth year]       BSA:           1.720 m Patient Age:    70 years        BP:           131/92 mmHg Patient Gender: F               HR:           90 bpm. Exam Location:  [facility] Procedure: 2D Echo, Cardiac Doppler and Color Doppler Indications:    CAD Native Vessel I25.10                 Dyspnea R06.00  History:        Patient has no prior history of Echocardiogram examinations.                 Stroke, Signs/Symptoms:Altered Mental Status; Risk                 Factors:Current Smoker.  Sonographer:    Greer Pickerel Referring Phys: [id number] Murlean Iba  Sonographer Comments: Image acquisition challenging due to patient body habitus and Image acquisition challenging due to respiratory motion. IMPRESSIONS  1. Left ventricular ejection fraction, by estimation, is 70 to 75%. The left ventricle has hyperdynamic function. Left ventricular endocardial border not optimally defined to evaluate regional wall motion. Left ventricular diastolic parameters are indeterminate. Elevated left atrial pressure.  2. Right ventricular systolic function was not well visualized. The right ventricular size is not well visualized.  3. A small pericardial effusion is present. The pericardial effusion is circumferential. There is no evidence of cardiac tamponade.  4. The mitral valve is normal in structure. No evidence of mitral valve regurgitation. No evidence of mitral stenosis.  5. Tricuspid valve regurgitation is mild to moderate.  6. The aortic valve is tricuspid. There is mild calcification of the aortic valve. There is mild thickening of the aortic valve. Aortic valve regurgitation is not visualized. No aortic stenosis is present.  7. The inferior vena cava is dilated in size with <50% respiratory variability, suggesting right atrial pressure of 15 mmHg. FINDINGS  Left Ventricle: Left ventricular ejection fraction, by estimation, is 70 to 75%. The left ventricle has hyperdynamic function. Left ventricular endocardial border not optimally defined to evaluate  regional wall motion. The left ventricular internal cavity size was normal in size. There is no left ventricular hypertrophy. Left ventricular diastolic parameters are indeterminate. Elevated left atrial pressure. Right Ventricle: The right ventricular size is not well visualized. Right vetricular wall thickness was not well visualized. Right ventricular systolic function was not well visualized. Left Atrium: Left atrial size was not well visualized.  Right Atrium: Right atrial size was not well visualized. Pericardium: A small pericardial effusion is present. The pericardial effusion is circumferential. There is no evidence of cardiac tamponade. Mitral Valve: The mitral valve is normal in structure. No evidence of mitral valve regurgitation. No evidence of mitral valve stenosis. Tricuspid Valve: The tricuspid valve is not well visualized. Tricuspid valve regurgitation is mild to moderate. Aortic Valve: The aortic valve is tricuspid. There is mild calcification of the aortic valve. There is mild thickening of the aortic valve. There is mild aortic valve annular calcification. Aortic valve regurgitation is not visualized. No aortic stenosis  is present. Aortic valve mean gradient measures 5.4 mmHg. Aortic valve peak gradient measures 12.1 mmHg. Aortic valve area, by VTI measures 2.49 cm. Pulmonic Valve: The pulmonic valve was not well visualized. Pulmonic valve regurgitation is not visualized. No evidence of pulmonic stenosis. Aorta: The aortic root is normal in size and structure. Venous: The inferior vena cava is dilated in size with less than 50% respiratory variability, suggesting right atrial pressure of 15 mmHg. IAS/Shunts: The interatrial septum was not well visualized.  LEFT VENTRICLE PLAX 2D LVIDd:         3.20 cm   Diastology LVIDs:         1.80 cm   LV e' medial:    5.91 cm/s LV PW:         1.00 cm   LV E/e' medial:  16.1 LV IVS:        0.90 cm   LV e' lateral:   7.15 cm/s LVOT diam:     1.90 cm   LV E/e'  lateral: 13.3 LV SV:         82 LV SV Index:   48 LVOT Area:     2.84 cm  RIGHT VENTRICLE RV S prime:     8.83 cm/s TAPSE (M-mode): 1.2 cm LEFT ATRIUM           Index        RIGHT ATRIUM           Index LA diam:      3.30 cm 1.92 cm/m   RA Area:     15.40 cm LA Vol (A4C): 36.9 ml 21.45 ml/m  RA Volume:   41.60 ml  24.18 ml/m  AORTIC VALVE AV Area (Vmax):    2.17 cm AV Area (Vmean):   2.37 cm AV Area (VTI):     2.49 cm AV Vmax:           173.74 cm/s AV Vmean:          106.109 cm/s AV VTI:            0.329 m AV Peak Grad:      12.1 mmHg AV Mean Grad:      5.4 mmHg LVOT Vmax:         133.00 cm/s LVOT Vmean:        88.600 cm/s LVOT VTI:          0.289 m LVOT/AV VTI ratio: 0.88  AORTA Ao Root diam: 3.80 cm MITRAL VALVE MV Area (PHT): 2.91 cm     SHUNTS MV Decel Time: 261 msec     Systemic VTI:  0.29 m MV E velocity: 95.20 cm/s   Systemic Diam: 1.90 cm MV A velocity: 103.00 cm/s MV E/A ratio:  0.92 Carlyle Dolly MD Electronically signed by Carlyle Dolly MD Signature Date/Time: 02/19/2022/11:58:45 AM    Final    CT Head  Wo Contrast  Result Date: 02/15/2022 CLINICAL DATA:  Trauma EXAM: CT HEAD WITHOUT CONTRAST CT CERVICAL SPINE WITHOUT CONTRAST TECHNIQUE: Multidetector CT imaging of the head and cervical spine was performed following the standard protocol without intravenous contrast. Multiplanar CT image reconstructions of the cervical spine were also generated. RADIATION DOSE REDUCTION: This exam was performed according to the departmental dose-optimization program which includes automated exposure control, adjustment of the mA and/or kV according to patient size and/or use of iterative reconstruction technique. COMPARISON:  CT C Spine 08/02/19, CT head 08/12/20 FINDINGS: CT HEAD FINDINGS Brain: No evidence of acute infarction, hemorrhage, hydrocephalus, extra-axial collection or mass lesion/mass effect. Redemonstrated is chronic infarct in the anterior right temporal lobe with mild ex vacuo dilatation of the  right temporal horn. 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 Vascular: No hyperdense vessel or unexpected calcification. Skull: Normal. Negative for fracture or focal lesion. Sinuses/Orbits: No middle ear or mastoid effusion. Paranasal sinuses are clear. Orbits are unremarkable. Other: None. CT CERVICAL SPINE FINDINGS Alignment: Trace anterolisthesis of C3 on C4, C4 on C5. Skull base and vertebrae: No acute fracture. No primary bone lesion or focal pathologic process. Disc space loss at C6-C7 has slightly progressed from prior exam. Soft tissues and spinal canal: No prevertebral fluid or swelling. No visible canal hematoma. Disc levels: No CT evidence of high-grade spinal canal stenosis. There is multilevel severe degenerative facet disease resulting in moderate neural foraminal stenosis at C3-C4 on the right and severe neural foraminal stenosis at C4-C5 bilaterally. Upper chest: See separately dictated CT chest for additional findings. Severe centrilobular emphysema. Other: None IMPRESSION: CT HEAD: 1. No acute intracranial abnormality. 2. 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 CT CERVICAL SPINE: No acute fracture or traumatic malalignment of the cervical spine. Emphysema (ICD10-J43.9). Electronically Signed   By: Marin Roberts M.D.   On: 02/15/2022 14:04   CT Cervical Spine Wo Contrast  Result Date: 02/15/2022 CLINICAL DATA:  Trauma EXAM: CT HEAD WITHOUT CONTRAST CT CERVICAL SPINE WITHOUT CONTRAST TECHNIQUE: Multidetector CT imaging of the head and cervical spine was performed following the standard protocol without intravenous contrast. Multiplanar CT image reconstructions of the cervical spine were also generated. RADIATION DOSE REDUCTION: This exam was performed according to the departmental dose-optimization program which includes automated exposure control, adjustment of the mA and/or kV according to patient size and/or use of  iterative reconstruction technique. COMPARISON:  CT C Spine 08/02/19, CT head 08/12/20 FINDINGS: CT HEAD FINDINGS Brain: No evidence of acute infarction, hemorrhage, hydrocephalus, extra-axial collection or mass lesion/mass effect. Redemonstrated is chronic infarct in the anterior right temporal lobe with mild ex vacuo dilatation of the right temporal horn. 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 Vascular: No hyperdense vessel or unexpected calcification. Skull: Normal. Negative for fracture or focal lesion. Sinuses/Orbits: No middle ear or mastoid effusion. Paranasal sinuses are clear. Orbits are unremarkable. Other: None. CT CERVICAL SPINE FINDINGS Alignment: Trace anterolisthesis of C3 on C4, C4 on C5. Skull base and vertebrae: No acute fracture. No primary bone lesion or focal pathologic process. Disc space loss at C6-C7 has slightly progressed from prior exam. Soft tissues and spinal canal: No prevertebral fluid or swelling. No visible canal hematoma. Disc levels: No CT evidence of high-grade spinal canal stenosis. There is multilevel severe degenerative facet disease resulting in moderate neural foraminal stenosis at C3-C4 on the right and severe neural foraminal stenosis at C4-C5 bilaterally. Upper chest: See  separately dictated CT chest for additional findings. Severe centrilobular emphysema. Other: None IMPRESSION: CT HEAD: 1. No acute intracranial abnormality. 2. 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 CT CERVICAL SPINE: No acute fracture or traumatic malalignment of the cervical spine. Emphysema (ICD10-J43.9). Electronically Signed   By: Marin Roberts M.D.   On: 02/15/2022 14:04   CT Chest Wo Contrast  Result Date: 02/15/2022 CLINICAL DATA:  LEFT chest wall trauma. EXAM: CT CHEST WITHOUT CONTRAST TECHNIQUE: Multidetector CT imaging of the chest was performed following the standard protocol without IV contrast. RADIATION DOSE  REDUCTION: This exam was performed according to the departmental dose-optimization program which includes automated exposure control, adjustment of the mA and/or kV according to patient size and/or use of iterative reconstruction technique. COMPARISON:  None Available. FINDINGS: Cardiovascular: No thoracic aortic aneurysm. Aortic atherosclerosis. Three-vessel coronary artery calcifications, particularly dense within the LEFT anterior descending coronary artery. No clinically significant pericardial effusion. Mediastinum/Nodes: No hemorrhage or edema is seen within the mediastinum. No mass or enlarged lymph nodes are seen within the mediastinum. Esophagus is unremarkable. Trachea is unremarkable. Lungs/Pleura: Paraseptal and centrilobular emphysematous changes bilaterally, upper lobe predominant. Bibasilar consolidations, LEFT greater than RIGHT. Small bilateral pleural effusions. No pneumothorax. Upper Abdomen: Limited images of the upper abdomen are unremarkable. Musculoskeletal: Minimally displaced fractures involving the posterior and posterior-lateral segments of the LEFT eleventh rib. Additional slightly displaced fracture of the posterior-lateral LEFT tenth rib and LEFT lateral ninth rib. Chronic wedge compression deformity of the L1 vertebral body. Degenerative spondylosis of the thoracic spine, mild to moderate in degree. IMPRESSION: 1. Minimally displaced to slightly displaced fractures of the LEFT ninth through eleventh ribs, as detailed above. 2. Bibasilar consolidations, LEFT greater than RIGHT, most likely a combination of atelectasis and pneumonia or aspiration. Favor at least some component of aspiration given the recent trauma. 3. Small bilateral pleural effusions. No pneumothorax. 4. Three-vessel coronary artery calcifications, particularly dense within the LEFT anterior descending coronary artery. Recommend correlation with any possible associated cardiac symptoms. 5. Chronic wedge compression  deformity of the L1 vertebral body. Emphysema (ICD10-J43.9). Electronically Signed   By: Franki Cabot M.D.   On: 02/15/2022 14:03   DG Ankle Complete Right  Result Date: 02/15/2022 CLINICAL DATA:  Right ankle pain, status post fall EXAM: RIGHT ANKLE - COMPLETE 3+ VIEW COMPARISON:  08/11/2020 ankle radiographs FINDINGS: Comminuted, mildly displaced and impacted fracture of the distal fibula. Comminuted, mildly displaced and distracted fracture through the medial malleolus. Soft tissue swelling about the ankle. IMPRESSION: Comminuted, mildly displaced and impacted fractures through the distal fibula and medial malleolus. Electronically Signed   By: Merilyn Baba M.D.   On: 02/15/2022 13:09    Cassidy Eva, DO  [facility]  If 7PM-7AM, please contact night-coverage [email or web address] Password [email or web address] 02/23/2022, 10:09 AM   LOS: 8 days

## 2022-02-24 DIAGNOSIS — J9601 Acute respiratory failure with hypoxia: Secondary | ICD-10-CM | POA: Diagnosis not present

## 2022-02-24 DIAGNOSIS — J690 Pneumonitis due to inhalation of food and vomit: Secondary | ICD-10-CM | POA: Diagnosis not present

## 2022-02-24 DIAGNOSIS — N179 Acute kidney failure, unspecified: Secondary | ICD-10-CM | POA: Diagnosis not present

## 2022-02-24 DIAGNOSIS — I5031 Acute diastolic (congestive) heart failure: Secondary | ICD-10-CM | POA: Diagnosis not present

## 2022-02-24 LAB — BASIC METABOLIC PANEL
Anion gap: 5 (ref 5–15)
BUN: 15 mg/dL (ref 8–23)
CO2: 36 mmol/L — ABNORMAL HIGH (ref 22–32)
Calcium: 8.8 mg/dL — ABNORMAL LOW (ref 8.9–10.3)
Chloride: 95 mmol/L — ABNORMAL LOW (ref 98–111)
Creatinine, Ser: 0.67 mg/dL (ref 0.44–1.00)
GFR, Estimated: >60 mL/min (ref >60)
Glucose, Bld: 103 mg/dL — ABNORMAL HIGH (ref 70–99)
Potassium: 3.8 mmol/L (ref 3.5–5.1)
Sodium: 136 mmol/L (ref 135–145)

## 2022-02-24 LAB — CBC
HCT: 25.2 % — ABNORMAL LOW (ref 36.0–46.0)
Hemoglobin: 8.6 g/dL — ABNORMAL LOW (ref 12.0–15.0)
MCH: 34 pg (ref 26.0–34.0)
MCHC: 34.1 g/dL (ref 30.0–36.0)
MCV: 99.6 fL (ref 80.0–100.0)
Platelets: 168 10*3/uL (ref 150–400)
RBC: 2.53 MIL/uL — ABNORMAL LOW (ref 3.87–5.11)
RDW: 19.6 % — ABNORMAL HIGH (ref 11.5–15.5)
WBC: 6.5 10*3/uL (ref 4.0–10.5)
nRBC: 0 % (ref 0.0–0.2)

## 2022-02-24 LAB — MAGNESIUM: Magnesium: 1.9 mg/dL (ref 1.7–2.4)

## 2022-02-24 LAB — BRAIN NATRIURETIC PEPTIDE: B Natriuretic Peptide: 122 pg/mL — ABNORMAL HIGH (ref 0.0–100.0)

## 2022-02-24 MED ORDER — FUROSEMIDE 40 MG PO TABS: 40.0000 mg | ORAL_TABLET | Freq: Every day | ORAL | Status: DC

## 2022-02-24 MED ORDER — IPRATROPIUM-ALBUTEROL 0.5-2.5 (3) MG/3ML IN SOLN: 3.0000 mL | Freq: Three times a day (TID) | RESPIRATORY_TRACT | Status: DC

## 2022-02-24 MED ORDER — IPRATROPIUM-ALBUTEROL 0.5-2.5 (3) MG/3ML IN SOLN: 3.0000 mL | Freq: Four times a day (QID) | RESPIRATORY_TRACT | Status: AC | PRN

## 2022-02-24 MED ORDER — METHYLPREDNISOLONE SODIUM SUCC 125 MG IJ SOLR
125.0000 mg | Freq: Once | INTRAMUSCULAR | Status: AC
  Administered 2022-02-24: 125 mg via INTRAVENOUS
  Filled 2022-02-24: qty 2

## 2022-02-24 MED ORDER — PREDNISONE 20 MG PO TABS: 50.0000 mg | ORAL_TABLET | Freq: Every day | ORAL | Status: DC

## 2022-02-24 MED ORDER — MAGNESIUM OXIDE -MG SUPPLEMENT 400 (240 MG) MG PO TABS: 400.0000 mg | ORAL_TABLET | Freq: Two times a day (BID) | ORAL | Status: DC

## 2022-02-24 MED ORDER — PREDNISONE 50 MG PO TABS: 50.0000 mg | ORAL_TABLET | Freq: Every day | ORAL | Status: DC

## 2022-02-24 NOTE — Progress Notes (Signed)
   02/24/22 1100  ReDS Vest / Clip  Station Marker A  Ruler Value 25  ReDS Value Range < 36  ReDS Actual Value 20

## 2022-02-24 NOTE — Progress Notes (Addendum)
Report was given to nurse Lovey Newcomer at [facility].

## 2022-02-24 NOTE — TOC Transition Note (Signed)
Transition of Care [facility]) - CM/SW Discharge Note   Patient Details  Name: Cassidy Smith MRN: [id number] Date of Birth: 11-19-1955  [facility] CM/SW Contact:  Salome Arnt, LCSW Phone Number: 02/24/2022, 11:19 AM   Clinical Narrative:  Pt d/c today to [facility]. Pt's son, Thedore Mins aware and agreeable. Will transport via [employer]. TOC arranged. D/C summary sent to SNF. Ebony Hail at [facility] notified of d/c and that pt has 30 day PASRR. SNF aware pt is on 6L. RN given number to call report.   Final next level of care: Skilled Nursing Facility Barriers to Discharge: Barriers Resolved   Patient Goals and CMS Choice CMS Medicare.gov Compare Post Acute Care list provided to:: Patient Represenative (must comment) Choice offered to / list presented to : Adult Children  Discharge Placement PASRR number recieved: 02/24/22 PASRR number recieved: 02/24/22            Patient chooses bed at: Other - please specify in the comment section below: [facility]) Patient to be transferred to facility by: [facility] Name of family member notified: Zach-son Patient and family notified of of transfer: 02/24/22  Discharge Plan and Services Additional resources added to the After Visit Summary for                                       Social Determinants of Health (SDOH) Interventions SDOH Screenings   Food Insecurity: No Food Insecurity (02/15/2022)  Housing: Low Risk  (02/15/2022)  Transportation Needs: No Transportation Needs (02/15/2022)  Utilities: Not At Risk (02/15/2022)  Tobacco Use: Medium Risk (02/15/2022)     Readmission Risk Interventions    07/26/2020   12:20 PM  Readmission Risk Prevention Plan  Transportation Screening Complete  Home Care Screening Complete  Medication Review (RN CM) Complete

## 2022-02-24 NOTE — Progress Notes (Deleted)
   02/24/22 1000  ReDS Vest / Clip  Station Marker A  Ruler Value 25  ReDS Value Range < 36 (low quality. completed the reds clip twice)  ReDS Actual Value 20

## 2022-02-24 NOTE — Discharge Summary (Signed)
Physician Discharge Summary   Patient: Cassidy Smith MRN: [id number] DOB: 1955-04-28  Admit date:     02/15/2022  Discharge date: 02/24/22  Discharge Physician: Shanon Brow Jaekwon Mcclune   PCP: Neale Burly, MD   Recommendations at discharge:   Please follow up with primary care provider within 1-2 weeks  Please repeat BMP, mag and CBC in one week  Please  maintain patient on 5L oxygen [place], wean for saturation 88-92% Follow up orthopedics, Dr. Amedeo Kinsman in 1-2 weeks    Hospital Course: 67 year old female with history of depression, anxiety, bipolar 1 disorder, chronic DVT, chronic hip pain, dementia, history of UTIs and hypothyroidism, previous intracerebral hematoma, history of hemorrhagic CVA, SIADH, DNR present on admission, history of rhabdomyolysis who apparently has been living alone but having care aides that are with her during the daytime however she remains alone at night.  Historian reported that patient has been having frequent falls recently at home and gait instability which is worsened over the past 3 to 4 days.  She also is having intermittent confusion which is worse from her baseline.  She has had poor oral intake over the past several days according to caretakers.  Patient denies having fever and chills.  Patient denies chest pain and reports having cough and chest congestion.  Patient was found down this morning by her care aides.  She apparently had fallen sometime in the middle of the night.  She had noticeable bruising and swelling to her right ankle and she hit her head.  She has bruising on her left rib cage.  Patient normally ambulates with a walker.  Family reports that patient has not been at her baseline mentation for the past several days.  Workup in the emergency department reveals that patient has severe acute kidney injury, she has a fractured right ankle and multiple rib fractures.  She also has evidence of an aspiration pneumonitis and she was started on IV fluids and IV  antibiotics.  Admission was requested for further management.    Assessment and Plan: AKI- treated and resolved  Prerenal secondary to severe dehydration Initially fluid resuscitated Renally dose medications Follow daily basic metabolic panel Creatinine improved to 2.27 >> 0.67 on day of d/c -serum creatinine stable on lasix   Aspiration pneumonia Had 5 days ceftriaxone and azithro--finished 2/6 Dysphagia diet ordered with aspiration precautions Supplemental oxygen as needed SLP eval requested    Acute respiratory distress /Acute respiratory failure with hypoxia - stop IV fluids  - started lasix IV--increased to BID - 2/9 ReDS--32 - 2/7 personally reviewed CXR--pulm edema, bilateral pleural effusion L>R - 2/7 Left side thora--800cc removed>>transudate -now stable on 4-5L -aim for oxygen saturation 88-92% -repeat CXR 2/10 personally reviewed--chronic interstitial markings, LLL pleural effusion -d/c with prednisone 50 mg x 4 days   Acute HFpEF -2/6 echo--EF 70-75%, mid-mod TR, small pericardial eff -start daily IV lasix--increase to bid -accurate I/O--incomplete -daily weights 138 lbs>>130 lbs - 2/7 Left side thora--800cc removed>>transudate -2/10 ReDS vest = 20 -d/c with lasix 40 mg po daily -repeat BMP 1 week after d/c   Dysphagia (severe) NPO initially SLP now recommending Dys 1 diet with NTL Aspiration precautions   Leukocytosis - resolved  Secondary to pneumonia and aspiration Resolved    Right ankle fracture Discussed with orthopedist Dr. Amedeo Kinsman who recommends splinting and outpatient follow-up with orthopedics Pain management as ordered NWB to RLE until ok with orthopedics Appreciate ortho consult, she will remain NWB and outpatient follow up in 2 weeks  Initially refused SNF>>now agreeable   Falls Frequently/High Fall Risk Fall precautions recommended PT evaluation requested and recommending SNF TOC consulted for SNF placement  Multiple discussions  with son and he is adamant he wants pt at his home with [facility] services and he has arranged for 24/7 supervision     Bipolar 1 disorder Resumed home behavioral health medications Delirium precautions recommended   Tobacco user/COPD Nicotine patch ordered Cessation discussed -nearly 100 pack year history -continue duoneb and pulmicort   Hyperthyroidism Resumed home methimazole    DNR present on admission Advance care planning documents have been scanned into the EMR and reviewed   Essential hypertension Diet controlled, following            Consultants: ortho Procedures performed: none  Disposition: Home Diet recommendation:  Dysphagia type 1 nectar thickened Liquid DISCHARGE MEDICATION: Allergies as of 02/24/2022   No Known Allergies      Medication List     STOP taking these medications    cefUROXime 250 MG tablet Commonly known as: CEFTIN       TAKE these medications    acetaminophen 325 MG tablet Commonly known as: TYLENOL Take 2 tablets (650 mg total) by mouth every 6 (six) hours as needed for mild pain (or Fever >/= 101).   aspirin EC 81 MG tablet Take 1 tablet (81 mg total) by mouth daily with breakfast. Swallow whole.   atorvastatin 20 MG tablet Commonly known as: LIPITOR Take 1 tablet (20 mg total) by mouth daily at 6 PM.   benztropine 1 MG tablet Commonly known as: COGENTIN Take 1 tablet (1 mg total) by mouth 2 (two) times daily.   cyanocobalamin 1000 MCG tablet Commonly known as: VITAMIN B12 Take 1 tablet (1,000 mcg total) by mouth daily.   divalproex 500 MG 24 hr tablet Commonly known as: DEPAKOTE ER Take 2 tablets (1,000 mg total) by mouth at bedtime.   feeding supplement Liqd Take 237 mLs by mouth 2 (two) times daily between meals.   furosemide 40 MG tablet Commonly known as: LASIX Take 1 tablet (40 mg total) by mouth daily. Start taking on: February 25, 2022 What changed:  medication strength how much to take   gabapentin  100 MG capsule Commonly known as: NEURONTIN Take 1 capsule (100 mg total) by mouth 3 (three) times daily. What changed:  how much to take when to take this additional instructions   ipratropium-albuterol 0.5-2.5 (3) MG/3ML Soln Commonly known as: DUONEB Take 3 mLs by nebulization 3 (three) times daily.   ipratropium-albuterol 0.5-2.5 (3) MG/3ML Soln Commonly known as: DUONEB Take 3 mLs by nebulization every 6 (six) hours as needed.   magnesium oxide 400 (240 Mg) MG tablet Commonly known as: MAG-OX Take 1 tablet (400 mg total) by mouth 2 (two) times daily.   methimazole 10 MG tablet Commonly known as: TAPAZOLE Take 10 mg by mouth every other day.   multivitamin with minerals Tabs tablet Take 1 tablet by mouth daily. Centrum Silver   pantoprazole 40 MG tablet Commonly known as: PROTONIX Take 40 mg by mouth daily.   potassium chloride 10 MEQ tablet Commonly known as: KLOR-CON Take 10 mEq by mouth daily.   predniSONE 50 MG tablet Commonly known as: DELTASONE Take 1 tablet (50 mg total) by mouth daily with breakfast. X 4 days Start taking on: February 25, 2022   traZODone 100 MG tablet Commonly known as: DESYREL Take 100 mg by mouth at bedtime.   Vitamin D-3 125 MCG (  5000 UT) Tabs Take 5,000 Units by mouth daily.        Contact information for after-discharge care     Destination     [facility] .   Service: Skilled Nursing Contact information: [address] [place] [phone]                    Discharge Exam: Danley Danker Weights   02/22/22 0500 02/23/22 0545 02/24/22 0457  Weight: 66.7 kg 61.2 kg 59 kg   HEENT:  [place]/AT, No thrush, no icterus CV:  RRR, no rub, no S3, no S4 Lung:  minimal basilar wheeze Abd:  soft/+BS, NT Ext:  No edema, no lymphangitis, no synovitis, no rash   Condition at discharge: stable  The results of significant diagnostics from this hospitalization (including  imaging, microbiology, ancillary and laboratory) are listed below for reference.   Imaging Studies: DG CHEST PORT 1 VIEW  Result Date: 02/23/2022 CLINICAL DATA:  Evaluate pleural effusion EXAM: PORTABLE CHEST 1 VIEW COMPARISON:  Chest x-ray February 20, 2022 FINDINGS: A left-sided pleural effusion is larger in the interval. Diffuse interstitial markings. Platelike opacity in the right mid lung. No change in the cardiomediastinal silhouette. No pneumothorax. IMPRESSION: 1. The left-sided pleural effusion is larger in the interval. Underlying opacity may represent compressive atelectasis. 2. Diffuse interstitial markings suggest pulmonary venous congestion versus mild edema. 3. Platelike opacity in the right mid lung may represent atelectasis and is stable. Electronically Signed   By: Dorise Bullion III M.D.   On: 02/23/2022 10:35   US THORACENTESIS ASP PLEURAL SPACE W/IMG GUIDE  Result Date: 02/20/2022 INDICATION: Dyspnea, pleural effusion after fall with rib fractures EXAM: ULTRASOUND GUIDED LEFT THORACENTESIS MEDICATIONS: None. COMPLICATIONS: None immediate. PROCEDURE: An ultrasound guided thoracentesis was thoroughly discussed with the patient and questions answered. The benefits, risks, alternatives and complications were also discussed. The patient understands and wishes to proceed with the procedure. Written consent was obtained. Ultrasound was performed to localize and mark an adequate pocket of fluid in the left chest. The area was then prepped and draped in the normal sterile fashion. 1% Lidocaine was used for local anesthesia. Under ultrasound guidance a 8Fr catheter was introduced. Thoracentesis was performed. The catheter was removed and a dressing applied. FINDINGS: A total of approximately 800cc of bloody fluid was removed. Samples were sent to the laboratory as requested by the clinical team. IMPRESSION: Successful ultrasound guided left thoracentesis yielding 800cc of pleural fluid. Performed  and dictated by Pasty Spillers, PA-C Electronically Signed   By: Lavonia Dana M.D.   On: 02/20/2022 12:35   DG Chest 1 View  Result Date: 02/20/2022 CLINICAL DATA:  [id number] Status post thoracentesis [id number] EXAM: CHEST  1 VIEW COMPARISON:  Same day chest x-ray FINDINGS: Stable cardiomediastinal contours. Near-complete resolution of previously seen left pleural effusion with improved aeration of the left lung base. Persistent small right pleural effusion. No pneumothorax. IMPRESSION: Near-complete resolution of previously seen left pleural effusion with improved aeration of the left lung base. No pneumothorax. Electronically Signed   By: Davina Poke D.O.   On: 02/20/2022 12:06   DG CHEST PORT 1 VIEW  Result Date: 02/20/2022 CLINICAL DATA:  Respiratory distress EXAM: PORTABLE CHEST 1 VIEW COMPARISON:  02/19/2022 FINDINGS: Stable cardiomediastinal contours. Aortic atherosclerosis. Pulmonary vascular congestion with diffusely prominent interstitial markings bilaterally. Persistent small right pleural effusion. Moderate-large left-sided pleural effusion has increased from prior with worsening aeration at the left lung base. No pneumothorax.  IMPRESSION: 1. Moderate-large left-sided pleural effusion has increased from prior with worsening aeration at the left lung base. 2. Persistent small right pleural effusion. Electronically Signed   By: Davina Poke D.O.   On: 02/20/2022 09:50   DG Swallowing Func-Speech Pathology  Result Date: 02/19/2022 Table formatting from the original result was not included. Modified Barium Swallow Study Patient Details Name: Cassidy Smith MRN: [id number] Date of Birth: [birth year] Today's Date: 02/19/2022 HPI/PMH: HPI: 67 year old female with history of depression, anxiety, bipolar 1 disorder, chronic DVT, chronic hip pain, dementia, history of UTIs and hypothyroidism, previous intracerebral hematoma, history of hemorrhagic CVA, SIADH, DNR present on admission, history of  rhabdomyolysis who apparently has been living alone but having care aides that are with her during the daytime however she remains alone at night. Historian reported that patient has been having frequent falls recently at home and gait instability which is worsened over the past 3 to 4 days.  She also is having intermittent confusion which is worse from her baseline.  She has had poor oral intake over the past several days according to caretakers. Pt found down on the floor at home by caregivers. Workup in the emergency department reveals that patient has severe acute kidney injury, she has a fractured right ankle and multiple rib fractures.  She also has evidence of an aspiration pneumonitis and she was started on IV fluids and IV antibiotics. BSE requested. Clinical Impression: Clinical Impression: Pt presents with mild/moderate oropharyngeal dysphagia that is negatively impacted by Tardive Dyskinesia (TD) which causes repetitive, writhing movements of her jaw and tongue. Pt's decreased oral coordination and bolus cohesion result in poor oral containment anteriorly and posteriorly; swallowing is triggered at the level of the pyriforms, trace to moderate pharyngeal residue of thin is aspirated in trace amounts; aspiration is sensed most of the time but aspirates were not expelled from the airway. Note aspiration does not occur with every thin presentation (not noted with very small controlled cup sips). Pt is physically unable to maintain chin tuck or consistently utilize any attempted compensatory strategies secondary to her TD. Despite decreased coordination and slow swallowing trigger at the pyriforms NTL, puree and regular are within functional limits with only trace residue on oral and pharyngeal structures with no penetration or aspiration visualized. Pt is at high risk for aspiration with thin liquids and suspect Pt has been compensating for her TD, however her current deconditioned Pt is frequently  aspirating thin liquids in trace amounts. Recommend continue with D1/puree and downgrade Pt's liquids to NTL. Pt is a good candidate for free water protocol and recommend dysphagia therapy with repeat MBSS when clinically indicated. Factors that may increase risk of adverse event in presence of aspiration ([facility] 2021): Weak cough; Dependence for feeding and/or oral hygiene Recommendations/Plan: Swallowing Evaluation Recommendations Swallowing Evaluation Recommendations Recommendations: PO diet PO Diet Recommendation: Dysphagia 1 (Pureed); Mildly thick liquids (Level 2, nectar thick) Liquid Administration via: Cup; Straw Medication Administration: Crushed with puree Supervision: Full assist for feeding Swallowing strategies  : Slow rate; Small bites/sips; Follow solids with liquids; Clear throat intermittently Oral care recommendations: Oral care BID (2x/day); Oral care before ice chips/water Caregiver Recommendations: Avoid jello, ice cream, thin soups, popsicles; Remove water pitcher Treatment Plan Treatment Plan Follow-up recommendations: Skilled nursing-short term rehab (<3 hours/day) Functional status assessment: Patient has had a recent decline in their functional status and demonstrates the ability to make significant improvements in function in a reasonable and predictable amount of time. Treatment  frequency: Min 2x/week Treatment duration: 2 weeks Interventions: Aspiration precaution training; Oropharyngeal exercises; Compensatory techniques; Patient/family education; Trials of upgraded texture/liquids; Diet toleration management by SLP; Respiratory muscle strength training Recommendations Recommendations for follow up therapy are one component of a multi-disciplinary discharge planning process, led by the attending physician.  Recommendations may be updated based on patient status, additional functional criteria and insurance authorization. Assessment: Orofacial Exam: Orofacial Exam Oral Cavity:  Oral Hygiene: Xerostomia Oral Cavity - Dentition: Edentulous Orofacial Anatomy: WFL Oral Motor/Sensory Function: WFL Anatomy: Anatomy: WFL Thin Liquids: Thin Liquids (Level 0) Thin Liquids : Impaired Bolus delivery method: Spoon; Cup Thin Liquid - Impairment: Oral Impairment; Pharyngeal impairment Lip Closure: Central anterior loss Tongue control during bolus hold: Escape to lateral buccal cavity/floor of mouth; Posterior escape of less than half of bolus Bolus transport/lingual motion: Delayed initiation of tongue motion (oral holding); Repetitive/disorganized tongue motion Oral residue: Trace residue lining oral structures Location of oral residue : Tongue; Floor of mouth; Palate Initiation of swallow : Pyriform sinuses Soft palate elevation: Complete Tongue base retraction: Trace column of contrast or air between tongue base and PPW Laryngeal elevation: Partial or minimal superior movement and approximation Anterior hyoid excursion: Partial Epiglottic movement: Complete Laryngeal vestibule closure: Incomplete Pharyngeal stripping wave : Present - diminished Pharyngeal contraction (A/P view only): N/A Pharyngoesophageal segment opening: Complete distension and complete duration, no obstruction of flow Pharyngeal residue: Collection of residue within or on pharyngeal structures Location of pharyngeal residue: Valleculae; Pharyngeal wall; Pyriform sinuses; Diffuse (>3 areas) Penetration/Aspiration Scale (PAS) score: 7.  Material enters airway, passes BELOW cords and not ejected out despite cough attempt by patient  Mildly Thick Liquids: Mildly thick liquids (Level 2, nectar thick) Mildly thick liquids (Level 2, nectar thick): Impaired Bolus delivery method: Cup; Spoon Mildly Thick Liquid - Impairment: Oral Impairment; Pharyngeal impairment Lip Closure: Central anterior loss Tongue control during bolus hold: Escape to lateral buccal cavity/floor of mouth; Posterior escape of less than half of bolus Bolus  transport/lingual motion: Repetitive/disorganized tongue motion; Slow tongue motion Oral residue: Trace residue lining oral structures; Residue collection on oral structures Location of oral residue : Floor of mouth; Tongue; Palate Initiation of swallow : Pyriform sinuses Soft palate elevation: Complete Tongue base retraction: Trace column of contrast or air between tongue base and PPW; Narrow column of contrast or air between tongue base and PPW Laryngeal elevation: Complete superior movement of thyroid cartilage with complete approximation of arytenoids to epiglottic petiole Anterior hyoid excursion: Complete Epiglottic movement: Complete Laryngeal vestibule closure: Complete: No air/contrast in laryngeal vestibule Pharyngeal stripping wave : Present - diminished Pharyngeal contraction (A/P view only): N/A Pharyngoesophageal segment opening: Complete distension and complete duration, no obstruction of flow Pharyngeal residue: Collection of residue within or on pharyngeal structures Location of pharyngeal residue: Valleculae; Pharyngeal wall; Pyriform sinuses; Diffuse (>3 areas); Tongue base; Aryepiglottic folds Penetration/Aspiration Scale (PAS) score: 1.  Material does not enter airway  Moderately Thick Liquids: Moderately thick liquids (Level 3, honey thick) Moderately thick liquids (Level 3, honey thick): WFL  Puree: Puree Puree: WFL Solid: Solid Solid: WFL Pill: Pill Pill: Not Tested Compensatory Strategies: Compensatory Strategies Compensatory strategies: Yes Chin tuck: Ineffective   General Information: Caregiver present: No  Diet Prior to this Study: Dysphagia 1 (pureed); Thin liquids (Level 0)   Temperature : Normal   Respiratory Status: WFL   Supplemental O2: None (Room air)   History of Recent Intubation: No  Behavior/Cognition: Alert; Cooperative; Pleasant mood Self-Feeding Abilities: Dependent for feeding Baseline vocal  quality/speech: Normal Volitional Cough: Able to elicit Volitional Swallow: Unable  to elicit No data recorded Goal Planning: Prognosis for improved oropharyngeal function: Fair Barriers to Reach Goals: Severity of deficits; Overall medical prognosis No data recorded Patient/Family Stated Goal: N/A Consulted and agree with results and recommendations: Patient; Nurse Pain: Pain Assessment Pain Assessment: 0-10 Pain Score: 10 Pain Location: Pointed to RLE Pain Descriptors / Indicators: Aching Pain Intervention(s): Limited activity within patient's tolerance End of Session: Start Time:SLP Start Time (ACUTE ONLY): 1430 Stop Time: SLP Stop Time (ACUTE ONLY): 1502 Time Calculation:SLP Time Calculation (min) (ACUTE ONLY): 32 min Charges: SLP Evaluations $ SLP Speech Visit: 1 Visit SLP Evaluations $BSS Swallow: 1 Procedure $MBS Swallow: 1 Procedure SLP visit diagnosis: SLP Visit Diagnosis: Dysphagia, unspecified (R13.10) Past Medical History: Past Medical History: Diagnosis Date  Anxiety   Bipolar 1 disorder (HCC)   Chronic deep vein thrombosis (DVT) of distal vein of lower extremity ([place]) 07/10/2017  Chronic hip pain   Depression   GI bleed   Headache(784.0)   History of UTI/bladder spams.   Hyperthyroidism   Hypokalemia 08/15/2017  Intracerebral hematoma ([place]) 07/04/2019  Ischemic stroke (HCC)   Neuromuscular disorder (HCC)   shaking of hands   Pneumonia of right lower lobe due to infectious organism 11/06/2019  Radial styloid fracture: right 03/06/2017  Rhabdomyolysis 06/07/2015  SIADH (syndrome of inappropriate ADH production) ([place]) 04/08/2017  Thyrotoxicosis, unspecified with thyrotoxic crisis or storm 01/15/2019 Past Surgical History: Past Surgical History: Procedure Laterality Date  COLONOSCOPY WITH PROPOFOL N/A 04/05/2017  Procedure: COLONOSCOPY WITH PROPOFOL;  Surgeon: Rogene Houston, MD;  [facility]: [facility];  Service: Endoscopy;  Laterality: N/A;  INTRAMEDULLARY (IM) NAIL INTERTROCHANTERIC Right 03/07/2017  Procedure: OPEN TREATMENT INTERNAL FIXATION RIGHT HIP WITH GAMA INTRAMEDULARY NAIL;   Surgeon: Carole Civil, MD;  Location: [facility];  Service: Orthopedics;  Laterality: Right;  MULTIPLE EXTRACTIONS WITH ALVEOLOPLASTY N/A 10/30/2012  Procedure: MULTIPLE EXTRACION 5, 6, 8, 9, 10 ,18, 19, 31 WITH MAXILLARY RIGHT AND LEFT  ALVEOLOPLASTY REDUCE MAXILLARY LEFT TUBEROSITY;  Surgeon: Gae Bon, DDS;  Location: [facility];  Service: Oral Surgery;  Laterality: N/A;  POLYPECTOMY  04/05/2017  Procedure: POLYPECTOMY;  Surgeon: Rogene Houston, MD;  Location: [facility];  Service: Endoscopy;;  recto-sigmoid, rectum Amelia H. Roddie Mc, CCC-SLP Speech Language Pathologist Wende Bushy 02/19/2022, 6:05 PM  DG CHEST PORT 1 VIEW  Result Date: 02/19/2022 CLINICAL DATA:  Acute respiratory distress EXAM: PORTABLE CHEST 1 VIEW COMPARISON:  08/12/2020 FINDINGS: Cardiomegaly. Aortic atherosclerosis. Interstitial pulmonary edema. Bilateral effusions with dependent atelectasis, left more than right. Findings most consistent with congestive heart failure. Coexistent pneumonia not excluded by radiography however. IMPRESSION: Congestive heart failure pattern with interstitial edema, bilateral effusions and dependent atelectasis, left more than right. Cannot exclude coexistent pneumonia. Electronically Signed   By: Nelson Chimes M.D.   On: 02/19/2022 16:29   ECHOCARDIOGRAM COMPLETE  Result Date: 02/19/2022    ECHOCARDIOGRAM REPORT   Patient Name:   Cassidy Smith Date of Exam: 02/19/2022 Medical Rec #:  [id number]       Height:       65.0 in Accession #:    [id number]      Weight:       144.0 lb Date of Birth:  Oct 05, 1955       BSA:          1.720 m Patient Age:    67 years        BP:  131/92 mmHg Patient Gender: F               HR:           90 bpm. Exam Location:  [facility] Procedure: 2D Echo, Cardiac Doppler and Color Doppler Indications:    CAD Native Vessel I25.10                 Dyspnea R06.00  History:        Patient has no prior history of Echocardiogram examinations.                 Stroke,  Signs/Symptoms:Altered Mental Status; Risk                 Factors:Current Smoker.  Sonographer:    Greer Pickerel Referring Phys: [id number] Murlean Iba  Sonographer Comments: Image acquisition challenging due to patient body habitus and Image acquisition challenging due to respiratory motion. IMPRESSIONS  1. Left ventricular ejection fraction, by estimation, is 70 to 75%. The left ventricle has hyperdynamic function. Left ventricular endocardial border not optimally defined to evaluate regional wall motion. Left ventricular diastolic parameters are indeterminate. Elevated left atrial pressure.  2. Right ventricular systolic function was not well visualized. The right ventricular size is not well visualized.  3. A small pericardial effusion is present. The pericardial effusion is circumferential. There is no evidence of cardiac tamponade.  4. The mitral valve is normal in structure. No evidence of mitral valve regurgitation. No evidence of mitral stenosis.  5. Tricuspid valve regurgitation is mild to moderate.  6. The aortic valve is tricuspid. There is mild calcification of the aortic valve. There is mild thickening of the aortic valve. Aortic valve regurgitation is not visualized. No aortic stenosis is present.  7. The inferior vena cava is dilated in size with <50% respiratory variability, suggesting right atrial pressure of 15 mmHg. FINDINGS  Left Ventricle: Left ventricular ejection fraction, by estimation, is 70 to 75%. The left ventricle has hyperdynamic function. Left ventricular endocardial border not optimally defined to evaluate regional wall motion. The left ventricular internal cavity size was normal in size. There is no left ventricular hypertrophy. Left ventricular diastolic parameters are indeterminate. Elevated left atrial pressure. Right Ventricle: The right ventricular size is not well visualized. Right vetricular wall thickness was not well visualized. Right ventricular systolic function was not  well visualized. Left Atrium: Left atrial size was not well visualized. Right Atrium: Right atrial size was not well visualized. Pericardium: A small pericardial effusion is present. The pericardial effusion is circumferential. There is no evidence of cardiac tamponade. Mitral Valve: The mitral valve is normal in structure. No evidence of mitral valve regurgitation. No evidence of mitral valve stenosis. Tricuspid Valve: The tricuspid valve is not well visualized. Tricuspid valve regurgitation is mild to moderate. Aortic Valve: The aortic valve is tricuspid. There is mild calcification of the aortic valve. There is mild thickening of the aortic valve. There is mild aortic valve annular calcification. Aortic valve regurgitation is not visualized. No aortic stenosis  is present. Aortic valve mean gradient measures 5.4 mmHg. Aortic valve peak gradient measures 12.1 mmHg. Aortic valve area, by VTI measures 2.49 cm. Pulmonic Valve: The pulmonic valve was not well visualized. Pulmonic valve regurgitation is not visualized. No evidence of pulmonic stenosis. Aorta: The aortic root is normal in size and structure. Venous: The inferior vena cava is dilated in size with less than 50% respiratory variability, suggesting right atrial pressure of 15 mmHg. IAS/Shunts: The  interatrial septum was not well visualized.  LEFT VENTRICLE PLAX 2D LVIDd:         3.20 cm   Diastology LVIDs:         1.80 cm   LV e' medial:    5.91 cm/s LV PW:         1.00 cm   LV E/e' medial:  16.1 LV IVS:        0.90 cm   LV e' lateral:   7.15 cm/s LVOT diam:     1.90 cm   LV E/e' lateral: 13.3 LV SV:         82 LV SV Index:   48 LVOT Area:     2.84 cm  RIGHT VENTRICLE RV S prime:     8.83 cm/s TAPSE (M-mode): 1.2 cm LEFT ATRIUM           Index        RIGHT ATRIUM           Index LA diam:      3.30 cm 1.92 cm/m   RA Area:     15.40 cm LA Vol (A4C): 36.9 ml 21.45 ml/m  RA Volume:   41.60 ml  24.18 ml/m  AORTIC VALVE AV Area (Vmax):    2.17 cm AV Area  (Vmean):   2.37 cm AV Area (VTI):     2.49 cm AV Vmax:           173.74 cm/s AV Vmean:          106.109 cm/s AV VTI:            0.329 m AV Peak Grad:      12.1 mmHg AV Mean Grad:      5.4 mmHg LVOT Vmax:         133.00 cm/s LVOT Vmean:        88.600 cm/s LVOT VTI:          0.289 m LVOT/AV VTI ratio: 0.88  AORTA Ao Root diam: 3.80 cm MITRAL VALVE MV Area (PHT): 2.91 cm     SHUNTS MV Decel Time: 261 msec     Systemic VTI:  0.29 m MV E velocity: 95.20 cm/s   Systemic Diam: 1.90 cm MV A velocity: 103.00 cm/s MV E/A ratio:  0.92 Carlyle Dolly MD Electronically signed by Carlyle Dolly MD Signature Date/Time: 02/19/2022/11:58:45 AM    Final    CT Head Wo Contrast  Result Date: 02/15/2022 CLINICAL DATA:  Trauma EXAM: CT HEAD WITHOUT CONTRAST CT CERVICAL SPINE WITHOUT CONTRAST TECHNIQUE: Multidetector CT imaging of the head and cervical spine was performed following the standard protocol without intravenous contrast. Multiplanar CT image reconstructions of the cervical spine were also generated. RADIATION DOSE REDUCTION: This exam was performed according to the departmental dose-optimization program which includes automated exposure control, adjustment of the mA and/or kV according to patient size and/or use of iterative reconstruction technique. COMPARISON:  CT C Spine 08/02/19, CT head 08/12/20 FINDINGS: CT HEAD FINDINGS Brain: No evidence of acute infarction, hemorrhage, hydrocephalus, extra-axial collection or mass lesion/mass effect. Redemonstrated is chronic infarct in the anterior right temporal lobe with mild ex vacuo dilatation of the right temporal horn. 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 Vascular: No hyperdense vessel or unexpected calcification. Skull: Normal. Negative for fracture or focal lesion. Sinuses/Orbits: No middle ear or mastoid effusion. Paranasal sinuses are clear. Orbits are unremarkable. Other: None. CT CERVICAL SPINE FINDINGS Alignment: Trace  anterolisthesis of C3  on C4, C4 on C5. Skull base and vertebrae: No acute fracture. No primary bone lesion or focal pathologic process. Disc space loss at C6-C7 has slightly progressed from prior exam. Soft tissues and spinal canal: No prevertebral fluid or swelling. No visible canal hematoma. Disc levels: No CT evidence of high-grade spinal canal stenosis. There is multilevel severe degenerative facet disease resulting in moderate neural foraminal stenosis at C3-C4 on the right and severe neural foraminal stenosis at C4-C5 bilaterally. Upper chest: See separately dictated CT chest for additional findings. Severe centrilobular emphysema. Other: None IMPRESSION: CT HEAD: 1. No acute intracranial abnormality. 2. 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 CT CERVICAL SPINE: No acute fracture or traumatic malalignment of the cervical spine. Emphysema (ICD10-J43.9). Electronically Signed   By: Marin Roberts M.D.   On: 02/15/2022 14:04   CT Cervical Spine Wo Contrast  Result Date: 02/15/2022 CLINICAL DATA:  Trauma EXAM: CT HEAD WITHOUT CONTRAST CT CERVICAL SPINE WITHOUT CONTRAST TECHNIQUE: Multidetector CT imaging of the head and cervical spine was performed following the standard protocol without intravenous contrast. Multiplanar CT image reconstructions of the cervical spine were also generated. RADIATION DOSE REDUCTION: This exam was performed according to the departmental dose-optimization program which includes automated exposure control, adjustment of the mA and/or kV according to patient size and/or use of iterative reconstruction technique. COMPARISON:  CT C Spine 08/02/19, CT head 08/12/20 FINDINGS: CT HEAD FINDINGS Brain: No evidence of acute infarction, hemorrhage, hydrocephalus, extra-axial collection or mass lesion/mass effect. Redemonstrated is chronic infarct in the anterior right temporal lobe with mild ex vacuo dilatation of the right temporal horn. Redemonstrated is a  chronic right cerebellar infarct, unchanged from prior exam. Vascular: No hyperdense vessel or unexpected calcification. Skull: Normal. Negative for fracture or focal lesion. Sinuses/Orbits: No middle ear or mastoid effusion. Paranasal sinuses are clear. Orbits are unremarkable. Other: None. CT CERVICAL SPINE FINDINGS Alignment: Trace anterolisthesis of C3 on C4, C4 on C5. Skull base and vertebrae: No acute fracture. No primary bone lesion or focal pathologic process. Disc space loss at C6-C7 has slightly progressed from prior exam. Soft tissues and spinal canal: No prevertebral fluid or swelling. No visible canal hematoma. Disc levels: No CT evidence of high-grade spinal canal stenosis. There is multilevel severe degenerative facet disease resulting in moderate neural foraminal stenosis at C3-C4 on the right and severe neural foraminal stenosis at C4-C5 bilaterally. Upper chest: See separately dictated CT chest for additional findings. Severe centrilobular emphysema. Other: None IMPRESSION: CT HEAD: 1. No acute intracranial abnormality. 2. 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 CT CERVICAL SPINE: No acute fracture or traumatic malalignment of the cervical spine. Emphysema (ICD10-J43.9). Electronically Signed   By: Marin Roberts M.D.   On: 02/15/2022 14:04   CT Chest Wo Contrast  Result Date: 02/15/2022 CLINICAL DATA:  LEFT chest wall trauma. EXAM: CT CHEST WITHOUT CONTRAST TECHNIQUE: Multidetector CT imaging of the chest was performed following the standard protocol without IV contrast. RADIATION DOSE REDUCTION: This exam was performed according to the departmental dose-optimization program which includes automated exposure control, adjustment of the mA and/or kV according to patient size and/or use of iterative reconstruction technique. COMPARISON:  None Available. FINDINGS: Cardiovascular: No thoracic aortic aneurysm. Aortic atherosclerosis. Three-vessel coronary artery  calcifications, particularly dense within the LEFT anterior descending coronary artery. No clinically significant pericardial effusion. Mediastinum/Nodes: No hemorrhage or edema is seen within the mediastinum. No mass or enlarged lymph nodes are seen within  the mediastinum. Esophagus is unremarkable. Trachea is unremarkable. Lungs/Pleura: Paraseptal and centrilobular emphysematous changes bilaterally, upper lobe predominant. Bibasilar consolidations, LEFT greater than RIGHT. Small bilateral pleural effusions. No pneumothorax. Upper Abdomen: Limited images of the upper abdomen are unremarkable. Musculoskeletal: Minimally displaced fractures involving the posterior and posterior-lateral segments of the LEFT eleventh rib. Additional slightly displaced fracture of the posterior-lateral LEFT tenth rib and LEFT lateral ninth rib. Chronic wedge compression deformity of the L1 vertebral body. Degenerative spondylosis of the thoracic spine, mild to moderate in degree. IMPRESSION: 1. Minimally displaced to slightly displaced fractures of the LEFT ninth through eleventh ribs, as detailed above. 2. Bibasilar consolidations, LEFT greater than RIGHT, most likely a combination of atelectasis and pneumonia or aspiration. Favor at least some component of aspiration given the recent trauma. 3. Small bilateral pleural effusions. No pneumothorax. 4. Three-vessel coronary artery calcifications, particularly dense within the LEFT anterior descending coronary artery. Recommend correlation with any possible associated cardiac symptoms. 5. Chronic wedge compression deformity of the L1 vertebral body. Emphysema (ICD10-J43.9). Electronically Signed   By: Franki Cabot M.D.   On: 02/15/2022 14:03   DG Ankle Complete Right  Result Date: 02/15/2022 CLINICAL DATA:  Right ankle pain, status post fall EXAM: RIGHT ANKLE - COMPLETE 3+ VIEW COMPARISON:  08/11/2020 ankle radiographs FINDINGS: Comminuted, mildly displaced and impacted fracture of the  distal fibula. Comminuted, mildly displaced and distracted fracture through the medial malleolus. Soft tissue swelling about the ankle. IMPRESSION: Comminuted, mildly displaced and impacted fractures through the distal fibula and medial malleolus. Electronically Signed   By: Merilyn Baba M.D.   On: 02/15/2022 13:09    Microbiology: Results for orders placed or performed during the hospital encounter of 02/15/22  Blood culture (routine x 2)     Status: None   Collection Time: 02/15/22  3:32 PM   Specimen: BLOOD  Result Value Ref Range Status   Specimen Description BLOOD BLOOD RIGHT ARM  Final   Special Requests Blood Culture adequate volume  Final   Culture   Final    NO GROWTH 6 DAYS Performed at [facility], [address], [place], [place] [address]    Report Status 02/21/2022 FINAL  Final  Blood culture (routine x 2)     Status: None   Collection Time: 02/15/22  3:32 PM   Specimen: BLOOD  Result Value Ref Range Status   Specimen Description BLOOD BLOOD RIGHT HAND AEROBIC BOTTLE ONLY  Final   Special Requests Blood Culture adequate volume  Final   Culture   Final    NO GROWTH 6 DAYS Performed at [facility], [address] [place], [place] [address]    Report Status 02/21/2022 FINAL  Final  Resp panel by RT-PCR (RSV, Flu A&B, Covid) Anterior Nasal Swab     Status: None   Collection Time: 02/20/22  9:45 AM   Specimen: Anterior Nasal Swab  Result Value Ref Range Status   SARS Coronavirus 2 by RT PCR NEGATIVE NEGATIVE Final    Comment: (NOTE) SARS-CoV-2 target nucleic acids are NOT DETECTED.  The SARS-CoV-2 RNA is generally detectable in upper respiratory specimens during the acute phase of infection. The lowest concentration of SARS-CoV-2 viral copies this assay can detect is 138 copies/mL. A negative result does not preclude SARS-Cov-2 infection and should not be used as the sole basis for treatment or other patient management decisions. A negative result may occur with   improper specimen collection/handling, submission of specimen other than nasopharyngeal swab, presence of viral  mutation(s) within the areas targeted by this assay, and inadequate number of viral copies(<138 copies/mL). A negative result must be combined with clinical observations, patient history, and epidemiological information. The expected result is Negative.  Fact Sheet for Patients:  EntrepreneurPulse.com.au  Fact Sheet for Healthcare Providers:  IncredibleEmployment.be  This test is no t yet approved or cleared by the [place] FDA and  has been authorized for detection and/or diagnosis of SARS-CoV-2 by FDA under an Emergency Use Authorization (EUA). This EUA will remain  in effect (meaning this test can be used) for the duration of the COVID-19 declaration under Section 564(b)(1) of the Act, 21 U.S.C.section 360bbb-3(b)(1), unless the authorization is terminated  or revoked sooner.       Influenza A by PCR NEGATIVE NEGATIVE Final   Influenza B by PCR NEGATIVE NEGATIVE Final    Comment: (NOTE) The Xpert Xpress SARS-CoV-2/FLU/RSV plus assay is intended as an aid in the diagnosis of influenza from Nasopharyngeal swab specimens and should not be used as a sole basis for treatment. Nasal washings and aspirates are unacceptable for Xpert Xpress SARS-CoV-2/FLU/RSV testing.  Fact Sheet for Patients: EntrepreneurPulse.com.au  Fact Sheet for Healthcare Providers: IncredibleEmployment.be  This test is not yet approved or cleared by the [place] FDA and has been authorized for detection and/or diagnosis of SARS-CoV-2 by FDA under an Emergency Use Authorization (EUA). This EUA will remain in effect (meaning this test can be used) for the duration of the COVID-19 declaration under Section 564(b)(1) of the Act, 21 U.S.C. section 360bbb-3(b)(1), unless the authorization is terminated or revoked.      Resp Syncytial Virus by PCR NEGATIVE NEGATIVE Final    Comment: (NOTE) Fact Sheet for Patients: EntrepreneurPulse.com.au  Fact Sheet for Healthcare Providers: IncredibleEmployment.be  This test is not yet approved or cleared by the [place] FDA and has been authorized for detection and/or diagnosis of SARS-CoV-2 by FDA under an Emergency Use Authorization (EUA). This EUA will remain in effect (meaning this test can be used) for the duration of the COVID-19 declaration under Section 564(b)(1) of the Act, 21 U.S.C. section 360bbb-3(b)(1), unless the authorization is terminated or revoked.  Performed at [facility], [address], [place], [place] [address]   Gram stain     Status: None   Collection Time: 02/20/22 11:30 AM   Specimen: Pleura  Result Value Ref Range Status   Specimen Description PLEURAL  Final   Special Requests LEFT CHEST  Final   Gram Stain   Final    NO ORGANISMS SEEN WBC PRESENT, PREDOMINANTLY MONONUCLEAR CYTOSPIN SMEAR Performed at [facility], [address] [place], [place] [address]    Report Status 02/20/2022 FINAL  Final  Culture, body fluid w Gram Stain-bottle     Status: None (Preliminary result)   Collection Time: 02/20/22 11:30 AM   Specimen: Pleura  Result Value Ref Range Status   Specimen Description PLEURAL LEFT CHEST  Final   Special Requests BOTTLES DRAWN AEROBIC AND ANAEROBIC 10CC  Final   Culture   Final    NO GROWTH 4 DAYS Performed at [facility], [address] [place], [place] [address]    Report Status PENDING  Incomplete    Labs: CBC: Recent Labs  Lab 02/20/22 1018 02/22/22 0402 02/23/22 0611 02/24/22 0452  WBC 7.9 7.6 6.5 6.5  HGB 9.0* 8.1* 8.4* 8.6*  HCT 26.4* 21.9* 26.0* 25.2*  MCV 103.5* 100.9* 100.0 99.6  PLT 52* 86* 111* [id number]   Basic Metabolic Panel: Recent  Labs  Lab 02/20/22 1018 02/21/22 0405 02/22/22 0402 02/23/22 0611 02/24/22 0452  NA 143 141 141 139 136  K  3.8 3.8 3.7 4.3 3.8  CL 100 98 98 93* 95*  CO2 36* 35* 36* 38* 36*  GLUCOSE 108* 112* 87 93 103*  BUN 20 19 17 14 15  $ CREATININE 0.70 0.64 0.70 0.70 0.67  CALCIUM 9.4 8.9 8.9 8.8* 8.8*  MG 1.5* 1.9 1.6* 1.5* 1.9   Liver Function Tests: Recent Labs  Lab 02/21/22 0405  AST 20  ALT 15  ALKPHOS 90  BILITOT 0.4  PROT 5.3*  ALBUMIN 2.0*   CBG: No results for input(s): "GLUCAP" in the last 168 hours.  Discharge time spent: greater than 30 minutes.  Signed: Orson Eva, MD [facility] 02/24/2022

## 2022-02-24 NOTE — Progress Notes (Signed)
Pt has rested well this shift.  Able to wiggle right toes, toes warm to palp.  Tylenol for pain.  Pt able to swallow meds whole, one at a time, in applesauce.  Continues on 5L O2, LS Diminished.  Purewick in place.

## 2022-02-25 LAB — CULTURE, BODY FLUID W GRAM STAIN -BOTTLE: Culture: NO GROWTH

## 2022-03-22 ENCOUNTER — Other Ambulatory Visit: Payer: Self-pay | Admitting: *Deleted

## 2022-03-22 NOTE — Patient Outreach (Signed)
Cassidy Smith resides in [place]. Screening for potential [facility] care coordination services as benefit of health plan and PCP.  Update received from [place], Admissions Director. Cassidy Smith plans to return home. Has supportive family.   Will continue to follow for potential [facility] care coordination needs.   Marthenia Rolling, MSN, RN,BSN [place] Acute Care Coordinator [phone] (Direct dial)

## 2022-03-25 ENCOUNTER — Ambulatory Visit: Payer: Medicare Other | Admitting: Orthopedic Surgery

## 2022-03-26 ENCOUNTER — Encounter: Payer: Self-pay | Admitting: Orthopedic Surgery

## 2022-03-26 ENCOUNTER — Ambulatory Visit (INDEPENDENT_AMBULATORY_CARE_PROVIDER_SITE_OTHER): Payer: Medicare Other | Admitting: Orthopedic Surgery

## 2022-03-26 ENCOUNTER — Ambulatory Visit (INDEPENDENT_AMBULATORY_CARE_PROVIDER_SITE_OTHER): Payer: Medicare Other

## 2022-03-26 VITALS — Ht 65.0 in | Wt 130.0 lb

## 2022-03-26 DIAGNOSIS — S82891G Other fracture of right lower leg, subsequent encounter for closed fracture with delayed healing: Secondary | ICD-10-CM | POA: Diagnosis not present

## 2022-03-26 DIAGNOSIS — S82891A Other fracture of right lower leg, initial encounter for closed fracture: Secondary | ICD-10-CM | POA: Diagnosis not present

## 2022-03-26 DIAGNOSIS — L89899 Pressure ulcer of other site, unspecified stage: Secondary | ICD-10-CM | POA: Diagnosis not present

## 2022-03-26 NOTE — Patient Instructions (Signed)
General Cast Instructions  1.  You were placed in a cast in clinic today.  Please keep the cast material clean, dry and intact.  Please do not use anything to itch the under the cast.  If it gets itchy, you can consider taking benadryl, or similar medication.  If the cast material gets wet, place it on a towel and use a hair dryer on a low setting. 2.  Tylenol or Ibuprofen/Naproxen as needed.   3.  Recommend elevating your extremity as much as possible to help with swelling. 4.  F/u 2 weeks, cast off and repeat XR    Please schedule a morning appointment for 3/28

## 2022-03-26 NOTE — Progress Notes (Signed)
Orthopaedic Clinic Return  Assessment: Cassidy Smith is a 67 y.o. female with the following: Right bimalleolar ankle fracture Dorsal foot, soft tissue ulceration  Plan: Cassidy Smith has a bimalleolar ankle fracture.  Limited healing based on available radiographs.  She was seen in the hospital, and surgery was deferred due to acute onset pneumonia, and overall poor health.  She has recovered.  Unfortunately, she was lost to follow-up, and presents over a month from the date of her injury.  On physical exam, she has a wound on the dorsum of her foot.  There is an eschar that has developed, due to the Ace wrap being too tight, and being in place for too long.  In addition, there appears to be persistent instability of the ankle.  She is not in any pain, likely secondary to her dementia.  Given the severity of the soft tissue injury, I elected to place her in a cast and an attempt to allow some of the blistering and ulcerations to heal.  However, I am concerned that the soft tissue ulcers will not heal, especially in someone who has a history of vascular disease.  Ultimately, she may require surgery which may include operative fixation, or to address the soft tissue injuries.  It is possible that she could lose her foot based on the severity of her injury and sequela.  We will monitor her closely.  We may have to place an appropriate referral.  I will see her back in 2 weeks for repeat evaluation.  Follow-up: Return in about 16 days (around 04/11/2022).   Subjective:  Chief Complaint  Patient presents with   Ankle Injury    Splint removed, was very tight and ankle swollen has wound top of foot    Other    Asking when she can go home from [facility], I advised facility determines when she goes home based on independence and insurance     History of Present Illness: Cassidy Smith is a 67 y.o. female who returns to clinic for evaluation of right ankle pain.  I saw her as a consult in the  hospital, over a month ago.  She was diagnosed with a bimalleolar ankle fracture.  At the same time, she was very sick, and not a good surgical candidate.  She had aspiration pneumonia as well as acute kidney injury.  The circumstances of her injury were unclear.  As such, I recommended outpatient follow-up.  She was [facility] the hospital[facility].  However, she was lost to follow-up.  She now presents approximately 5 weeks since the date of the injury.  She has been in the same splint.  She has no pain.  She has not been bearing weight.  No fevers or chills recently.  Review of Systems: Unable to assess.  Objective: Ht '5\' 5"'$  (1.651 m)   Wt 130 lb (59 kg)   BMI 21.63 kg/m   Physical Exam:  Pleasant.  Answers some questions appropriately.  After removal of the splint and Ace wrap's, there is blistering about the ankle.  Over the dorsum of her foot, there is a large dry eschar.  No drainage.  There is a small ulcer over the medial malleolus.  No drainage here.  No tenderness to palpation.  Toes warm and well-perfused.  IMAGING: I personally ordered and reviewed the following images:  X-rays out of the splint, as well as after casting were obtained in clinic today.  There is persistent displacement of the  medial malleolus fracture fragment, as well as a distal fibular fracture.  There is mild talar tilt.  Post casting does not demonstrate appreciable difference in the overall alignment.  There is no evidence of callus formation.  No bony lesions.  Impression: Persistently displaced right bimalleolar ankle fracture    Cassidy Rasmussen, MD 03/26/2022 11:08 PM

## 2022-04-01 ENCOUNTER — Telehealth: Payer: Self-pay | Admitting: Orthopedic Surgery

## 2022-04-01 NOTE — Telephone Encounter (Signed)
Dr. Amedeo Kinsman patient - this patient is scheduled for 04/11/22.  Dr. Amedeo Kinsman will not be in the office.  Is it ok for the patient to wait until 04/16/22 to be seen.  When I spoke with her brother Annie Main, he wanted to make sure it was ok.  If not, I will schedule with another provider.

## 2022-04-04 ENCOUNTER — Other Ambulatory Visit: Payer: Self-pay | Admitting: *Deleted

## 2022-04-04 NOTE — Patient Outreach (Signed)
[facility] Post- Acute Care Coordinator follow up. Verified in [facility], Cassidy Smith discharged from [facility] on 03/28/22. Has [name].   [employer] social worker previously indicated [name] has supportive family.   No identifiable [facility] care coordination needs at this time.   Cassidy Rolling, MSN, RN,BSN [place] Acute Care Coordinator [phone] (Direct dial)

## 2022-04-08 ENCOUNTER — Encounter: Payer: Self-pay | Admitting: Orthopedic Surgery

## 2022-04-08 ENCOUNTER — Ambulatory Visit (INDEPENDENT_AMBULATORY_CARE_PROVIDER_SITE_OTHER): Payer: Medicare Other | Admitting: Orthopedic Surgery

## 2022-04-08 DIAGNOSIS — S91001D Unspecified open wound, right ankle, subsequent encounter: Secondary | ICD-10-CM

## 2022-04-08 NOTE — Patient Instructions (Signed)
We are referring you to [facility] from [facility] Office address is [address] [place] The phone number is [phone]  The office will call you with an appointment Dr. Duda  
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
## 2022-04-10 ENCOUNTER — Encounter: Payer: Self-pay | Admitting: Family

## 2022-04-10 ENCOUNTER — Ambulatory Visit (INDEPENDENT_AMBULATORY_CARE_PROVIDER_SITE_OTHER): Payer: Medicare Other | Admitting: Family

## 2022-04-10 DIAGNOSIS — L89896 Pressure-induced deep tissue damage of other site: Secondary | ICD-10-CM

## 2022-04-10 MED ORDER — ACETAMINOPHEN-CODEINE 300-30 MG PO TABS: 1.0000 | ORAL_TABLET | Freq: Four times a day (QID) | ORAL | 0 refills | Status: AC | PRN

## 2022-04-10 NOTE — Progress Notes (Signed)
Office Visit Note   Patient: Cassidy Smith           Date of Birth: 1955/07/08           MRN: [id number] Visit Date: 04/10/2022              Requested by: Neale Burly, MD [place],  [place] [id number] PCP: Neale Burly, MD  Chief Complaint  Patient presents with   Right Ankle - Wound Check      HPI: The patient is a 67 year old woman who is seen today in referral from Dr. Aline Brochure  The patient initially sustained an ankle fracture after a fall on February 2.  Was hospitalized at the time. 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  Plan: Will proceed with wound management judgment.  Feel that this should resolve unremarkably.  Silvadene dressing applied today.  Discussed offloading pressure daily Dial soap cleansing dressing changes.  Placed in a small cam walker May begin touchdown weightbearing on the right.  She will follow-up in 1 week with Dr. Sharol Given  Follow-Up Instructions: No follow-ups on file.   Ortho Exam  Patient is alert, oriented, no adenopathy, well-dressed, normal affect, normal respiratory effort. On examination of the right lower extremity there is mild to moderate edema of the foot she does have a dorsal foot ulcer covered with fibrinous exudative tissue this is 8 cm in diameter.  Over the lateral aspect of the same foot she has ulceration which is 7 cm in length 2 cm in width this is superficial filled in with 50% fibrinous exudative tissue there is no drainage erythema warmth or sign of infection.  To the posterior heel and Achilles area she has dried peeling skin appears to have previously had a blister or ulcer which has now resolved.   There is no sign of infection  With a Doppler has a strong dorsalis pedis pulse biphasic.  Imaging: No results found.    Labs: Lab Results  Component Value Date   HGBA1C 5.0 06/10/2017   HGBA1C 5.4 05/13/2013   ESRSEDRATE 2 05/13/2013   LABURIC 2.4 04/11/2017   REPTSTATUS 02/20/2022 FINAL 02/20/2022   REPTSTATUS 02/25/2022 FINAL 02/20/2022   GRAMSTAIN  02/20/2022    NO ORGANISMS SEEN WBC PRESENT, PREDOMINANTLY MONONUCLEAR CYTOSPIN SMEAR Performed at [facility], [address] [place], [place] [address]    CULT  02/20/2022    NO GROWTH 5 DAYS Performed at [facility], [address] [place], [place] [address]    LABORGA ESCHERICHIA COLI (A) 07/25/2020     Lab Results  Component Value Date   ALBUMIN 2.0 (L) 02/21/2022   ALBUMIN 3.2 (L) 08/12/2020   ALBUMIN 3.3 (L) 07/25/2020    Lab Results  Component Value Date   MG 1.9 02/24/2022   MG 1.5 (L) 02/23/2022   MG 1.6 (L) 02/22/2022   Lab Results  Component Value Date   VD25OH 49.2 03/06/2017    No results found for: "PREALBUMIN"    Latest Ref Rng & Units 02/24/2022    4:52 AM 02/23/2022    6:11 AM 02/22/2022    4:02 AM  CBC EXTENDED  WBC 4.0 - 10.5 K/uL 6.5  6.5  7.6   RBC 3.87 - 5.11 MIL/uL 2.53  2.60  2.17   Hemoglobin 12.0 - 15.0 g/dL 8.6  8.4  8.1   HCT 36.0 - 46.0 % 25.2  26.0  21.9   Platelets 150 - 400 K/uL 168  111  86      There is no height or weight on file to calculate BMI.  Orders:  No orders of the defined types were placed in this encounter.  Meds ordered this encounter  Medications   acetaminophen-codeine (TYLENOL #3) 300-30 MG tablet    Sig: Take 1 tablet by mouth every 6 (six) hours as needed for moderate pain.    Dispense:  30 tablet    Refill:  0     Procedures: No procedures performed  Clinical Data: No additional findings.  ROS:  All other systems negative, except as noted in the HPI. Review of Systems  Objective: Vital Signs: There were no vitals taken for this  visit.  Specialty Comments:  No specialty comments available.  PMFS History: Patient Active Problem List   Diagnosis Date Noted   Acute heart failure with preserved ejection fraction (HFpEF) ([place]) 02/20/2022   Acute respiratory failure with hypoxia ([place]) 02/20/2022   AKI (acute kidney injury) ([place]) 02/15/2022   Aspiration pneumonia ([place]) 02/15/2022   Leukocytosis 02/15/2022   Falls frequently 02/15/2022   SIRS (systemic inflammatory response syndrome) ([place]) 08/12/2020   Mild protein malnutrition ([place]) 08/12/2020   Sepsis ([place]) 07/25/2020   Sepsis with acute hypoxic respiratory failure without septic shock ([place])    Dementia without behavioral disturbance ([place])    Palliative care by specialist    Goals of care, counseling/discussion    Pneumonia of right lower lobe due to infectious organism 11/06/2019   Intracerebral hematoma ([place]) 07/04/2019   Closed head injury 07/03/2019   Thrombocytopenia ([place]) 07/03/2019   Thyrotoxicosis, unspecified with thyrotoxic crisis or storm 01/15/2019   History of syndrome of inappropriate antidiuretic hormone (SIADH) 01/15/2019   UTI (urinary tract infection) 01/04/2018   Neuromuscular disorder ([place])    Dysuria 10/30/2017   Fever 08/15/2017   Hypokalemia 08/15/2017   Altered mental state 123456   Acute metabolic encephalopathy 123456   Valproic acid toxicity 08/15/2017   Rectal cancer ([place]) 07/10/2017   Chronic deep vein thrombosis (DVT) of distal vein of lower extremity ([place]) 07/10/2017   Altered mental status    Ischemic stroke ([place])    GERD (gastroesophageal reflux disease) 05/12/2017   GI bleed    SIADH (syndrome of inappropriate ADH production) ([place]) 04/08/2017   Dysplastic rectal polyp    Protein-calorie malnutrition, severe 04/07/2017   Pressure injury of skin 04/04/2017   Anticoagulated 04/03/2017   Stool bloody 04/03/2017   Current every day smoker 04/03/2017   Rectal bleeding    Chronic diarrhea    Hyperthyroidism     S/P ORIF (open reduction internal fixation) fracture right hip IM nail 03/07/17 03/24/2017   Hypomagnesemia 03/07/2017   Fall    Closed intertrochanteric fracture of hip, right, initial encounter ([place]) 03/06/2017   Radial styloid fracture: right 03/06/2017   Normocytic anemia 03/06/2017   Orthostatic hypotension 06/07/2015   Lower urinary tract infectious disease 06/07/2015   Rhabdomyolysis 06/07/2015   White matter abnormality on MRI of brain 05/14/2013   History of depression 05/14/2013   Hx of anxiety disorder 05/14/2013   Bipolar I disorder, most recent episode (or current) manic ([place]) 05/14/2013   Bipolar 1 disorder ([place]) 05/14/2013   Hyponatremia 05/12/2013  Lacunar infarct, acute ([place]) 05/12/2013   Past Medical History:  Diagnosis Date   Anxiety    Bipolar 1 disorder ([place])    Chronic deep vein thrombosis (DVT) of distal vein of lower extremity ([place]) 07/10/2017   Chronic hip pain    Depression    GI bleed    Headache(784.0)    History of UTI/bladder spams.    Hyperthyroidism    Hypokalemia 08/15/2017   Intracerebral hematoma ([facility]) 07/04/2019   Ischemic stroke ([facility])    Neuromuscular disorder ([facility])    shaking of hands    Pneumonia of right lower lobe due to infectious organism 11/06/2019   Radial styloid fracture: right 03/06/2017   Rhabdomyolysis 06/07/2015   SIADH (syndrome of inappropriate ADH production) ([place]) 04/08/2017   Thyrotoxicosis, unspecified with thyrotoxic crisis or storm 01/15/2019    Family History  Problem Relation Age of Onset   Breast cancer Mother    Cancer - Other Mother    Alcoholism Father    Colon cancer Neg Hx    Colon polyps Neg Hx     Past Surgical History:  Procedure Laterality Date   COLONOSCOPY WITH PROPOFOL N/A 04/05/2017   Procedure: COLONOSCOPY WITH PROPOFOL;  Surgeon: Rogene Houston, MD;  Location[facility];  Service: Endoscopy;  Laterality: N/A;   INTRAMEDULLARY (IM) NAIL INTERTROCHANTERIC Right 03/07/2017   Procedure:  OPEN TREATMENT INTERNAL FIXATION RIGHT HIP WITH GAMA INTRAMEDULARY NAIL;  Surgeon: Carole Civil, MD;  Location: [facility];  Service: Orthopedics;  Laterality: Right;   MULTIPLE EXTRACTIONS WITH ALVEOLOPLASTY N/A 10/30/2012   Procedure: MULTIPLE EXTRACION 5, 6, 8, 9, 10 ,18, 19, 31 WITH MAXILLARY RIGHT AND LEFT  ALVEOLOPLASTY REDUCE MAXILLARY LEFT TUBEROSITY;  Surgeon: Gae Bon, DDS;  Location: [facility];  Service: Oral Surgery;  Laterality: N/A;   POLYPECTOMY  04/05/2017   Procedure: POLYPECTOMY;  Surgeon: Rogene Houston, MD;  Location: [facility];  Service: Endoscopy;;  recto-sigmoid, rectum   Social History   Occupational History   Not on file  Tobacco Use   Smoking status: Former    Packs/day: 0.50    Years: 20.00    Additional pack years: 0.00    Total pack years: 10.00    Types: Cigarettes    Quit date: 05/05/2017    Years since quitting: 4.9   Smokeless tobacco: Never   Tobacco comments:    patient states she quit one week ago  Vaping Use   Vaping Use: Never used  Substance and Sexual Activity   Alcohol use: No   Drug use: No   Sexual activity: Not on file

## 2022-04-11 ENCOUNTER — Emergency Department (HOSPITAL_COMMUNITY)
Admission: EM | Admit: 2022-04-11 | Discharge: 2022-04-11 | Disposition: A | Discharge status: Home / Self Care | Payer: Medicare Other | Attending: Student | Admitting: Student

## 2022-04-11 ENCOUNTER — Emergency Department (HOSPITAL_COMMUNITY): Payer: Medicare Other

## 2022-04-11 ENCOUNTER — Other Ambulatory Visit: Payer: Self-pay | Admitting: Family

## 2022-04-11 ENCOUNTER — Other Ambulatory Visit: Payer: Self-pay

## 2022-04-11 ENCOUNTER — Encounter: Payer: Medicare Other | Admitting: Orthopedic Surgery

## 2022-04-11 ENCOUNTER — Encounter (HOSPITAL_COMMUNITY): Payer: Self-pay

## 2022-04-11 DIAGNOSIS — J439 Emphysema, unspecified: Secondary | ICD-10-CM | POA: Diagnosis not present

## 2022-04-11 DIAGNOSIS — E878 Other disorders of electrolyte and fluid balance, not elsewhere classified: Secondary | ICD-10-CM | POA: Diagnosis not present

## 2022-04-11 DIAGNOSIS — M25551 Pain in right hip: Secondary | ICD-10-CM | POA: Diagnosis present

## 2022-04-11 DIAGNOSIS — Z7982 Long term (current) use of aspirin: Secondary | ICD-10-CM | POA: Insufficient documentation

## 2022-04-11 DIAGNOSIS — S0990XA Unspecified injury of head, initial encounter: Secondary | ICD-10-CM | POA: Insufficient documentation

## 2022-04-11 DIAGNOSIS — S82891G Other fracture of right lower leg, subsequent encounter for closed fracture with delayed healing: Secondary | ICD-10-CM

## 2022-04-11 DIAGNOSIS — D72829 Elevated white blood cell count, unspecified: Secondary | ICD-10-CM | POA: Diagnosis not present

## 2022-04-11 DIAGNOSIS — W19XXXA Unspecified fall, initial encounter: Secondary | ICD-10-CM

## 2022-04-11 DIAGNOSIS — W06XXXA Fall from bed, initial encounter: Secondary | ICD-10-CM | POA: Insufficient documentation

## 2022-04-11 DIAGNOSIS — M8788 Other osteonecrosis, other site: Secondary | ICD-10-CM | POA: Diagnosis not present

## 2022-04-11 DIAGNOSIS — M879 Osteonecrosis, unspecified: Secondary | ICD-10-CM

## 2022-04-11 DIAGNOSIS — E871 Hypo-osmolality and hyponatremia: Secondary | ICD-10-CM | POA: Insufficient documentation

## 2022-04-11 DIAGNOSIS — L89896 Pressure-induced deep tissue damage of other site: Secondary | ICD-10-CM

## 2022-04-11 LAB — CBC WITH DIFFERENTIAL/PLATELET
Abs Immature Granulocytes: 0.03 10*3/uL (ref 0.00–0.07)
Basophils Absolute: 0 10*3/uL (ref 0.0–0.1)
Basophils Relative: 0 %
Eosinophils Absolute: 0 10*3/uL (ref 0.0–0.5)
Eosinophils Relative: 0 %
HCT: 36.7 % (ref 36.0–46.0)
Hemoglobin: 11.6 g/dL — ABNORMAL LOW (ref 12.0–15.0)
Immature Granulocytes: 0 %
Lymphocytes Relative: 33 %
Lymphs Abs: 3.6 10*3/uL (ref 0.7–4.0)
MCH: 29.5 pg (ref 26.0–34.0)
MCHC: 31.6 g/dL (ref 30.0–36.0)
MCV: 93.4 fL (ref 80.0–100.0)
Monocytes Absolute: 1.4 10*3/uL — ABNORMAL HIGH (ref 0.1–1.0)
Monocytes Relative: 13 %
Neutro Abs: 5.7 10*3/uL (ref 1.7–7.7)
Neutrophils Relative %: 54 %
Platelets: 228 10*3/uL (ref 150–400)
RBC: 3.93 MIL/uL (ref 3.87–5.11)
RDW: 14.1 % (ref 11.5–15.5)
WBC: 10.8 10*3/uL — ABNORMAL HIGH (ref 4.0–10.5)
nRBC: 0 % (ref 0.0–0.2)

## 2022-04-11 LAB — BASIC METABOLIC PANEL
Anion gap: 8 (ref 5–15)
BUN: 16 mg/dL (ref 8–23)
CO2: 32 mmol/L (ref 22–32)
Calcium: 9.3 mg/dL (ref 8.9–10.3)
Chloride: 94 mmol/L — ABNORMAL LOW (ref 98–111)
Creatinine, Ser: 0.6 mg/dL (ref 0.44–1.00)
GFR, Estimated: >60 mL/min (ref >60)
Glucose, Bld: 102 mg/dL — ABNORMAL HIGH (ref 70–99)
Potassium: 3.8 mmol/L (ref 3.5–5.1)
Sodium: 134 mmol/L — ABNORMAL LOW (ref 135–145)

## 2022-04-11 NOTE — Discharge Instructions (Signed)
There is no evidence of hip fracture today on imaging.  There was some evidence of what is called osteonecrosis which happens when the bone starts to erode away.  I would like for you to follow-up with orthopedics regarding this.  I have given you a number for Dr. Aline Brochure which you can call and schedule up an appointment.  You may return to the emergency room for any worsening symptoms.

## 2022-04-11 NOTE — ED Provider Notes (Signed)
[facility] Provider Note   CSN: [id number] Arrival date & time: 04/11/22  1130     History Chief Complaint  Patient presents with   Cassidy Smith is a 67 y.o. female patient who presents to the emergency department today for further evaluation of right hip pain after a fall.  Patient states that she fell off the bed onto the tile floor on the right side.  She was able to get up but it was very difficult.  She has been unable to ambulate today secondary to pain.  She is brought to the emergency room via EMS.  She does state that she struck her head. She denies any LOC.    Fall       Home Medications Prior to Admission medications   Medication Sig Start Date End Date Taking? Authorizing Provider  acetaminophen (TYLENOL) 325 MG tablet Take 2 tablets (650 mg total) by mouth every 6 (six) hours as needed for mild pain (or Fever >/= 101). 08/14/20   Roxan Hockey, MD  acetaminophen-codeine (TYLENOL #3) 300-30 MG tablet Take 1 tablet by mouth every 6 (six) hours as needed for moderate pain. 04/10/22   Suzan Slick, NP  aspirin EC 81 MG tablet Take 1 tablet (81 mg total) by mouth daily with breakfast. Swallow whole. 08/14/20   Roxan Hockey, MD  atorvastatin (LIPITOR) 20 MG tablet Take 1 tablet (20 mg total) by mouth daily at 6 PM. 06/11/17   Barton Dubois, MD  benztropine (COGENTIN) 1 MG tablet Take 1 tablet (1 mg total) by mouth 2 (two) times daily. 01/05/18   Johnson, Clanford L, MD  Cholecalciferol (VITAMIN D-3) 125 MCG (5000 UT) TABS Take 5,000 Units by mouth daily.    [provider]  divalproex (DEPAKOTE ER) 500 MG 24 hr tablet Take 2 tablets (1,000 mg total) by mouth at bedtime. 08/16/17   Orson Eva, MD  feeding supplement (ENSURE ENLIVE / ENSURE PLUS) LIQD Take 237 mLs by mouth 2 (two) times daily between meals. 08/15/20   Roxan Hockey, MD  furosemide (LASIX) 40 MG tablet Take 1 tablet (40 mg total) by mouth  daily. 02/25/22   Orson Eva, MD  gabapentin (NEURONTIN) 100 MG capsule Take 1 capsule (100 mg total) by mouth 3 (three) times daily. Patient taking differently: Take 100-200 mg by mouth in the morning and at bedtime. 1 capsule in the morning and 2 capsules in the evening. 05/13/17   Carole Civil, MD  ipratropium-albuterol (DUONEB) 0.5-2.5 (3) MG/3ML SOLN Take 3 mLs by nebulization 3 (three) times daily. 02/24/22   Orson Eva, MD  ipratropium-albuterol (DUONEB) 0.5-2.5 (3) MG/3ML SOLN Take 3 mLs by nebulization every 6 (six) hours as needed. 02/24/22   Orson Eva, MD  magnesium oxide (MAG-OX) 400 (240 Mg) MG tablet Take 1 tablet (400 mg total) by mouth 2 (two) times daily. 02/24/22   Orson Eva, MD  methimazole (TAPAZOLE) 10 MG tablet Take 10 mg by mouth every other day. 07/06/20   [provider]  Multiple Vitamin (MULTIVITAMIN WITH MINERALS) TABS tablet Take 1 tablet by mouth daily. Centrum Silver    [provider]  omeprazole (PRILOSEC) 40 MG capsule Take 40 mg by mouth daily. 04/02/22   [provider]  pantoprazole (PROTONIX) 40 MG tablet Take 40 mg by mouth daily.    [provider]  potassium chloride (KLOR-CON) 10 MEQ tablet Take 10 mEq by mouth daily. 01/16/22  [provider]  predniSONE (DELTASONE) 50 MG tablet Take 1 tablet (50 mg total) by mouth daily with breakfast. X 4 days 02/25/22   Orson Eva, MD  traZODone (DESYREL) 100 MG tablet Take 100 mg by mouth at bedtime.    [provider]  vitamin B-12 (CYANOCOBALAMIN) 1000 MCG tablet Take 1 tablet (1,000 mcg total) by mouth daily. 06/11/17   Barton Dubois, MD      Allergies    Patient has no known allergies.    Review of Systems   Review of Systems  All other systems reviewed and are negative.   Physical Exam Updated Vital Signs BP (!) 143/74   Pulse 80   Temp 98.4 F (36.9 C) (Oral)   Resp 15   Ht 5\' 5"  (1.651 m)   Wt 54.4 kg   SpO2 98%   BMI 19.97 kg/m  Physical  Exam Vitals and nursing note reviewed.  Constitutional:      Appearance: Normal appearance.  HENT:     Head: Normocephalic and atraumatic.  Eyes:     General:        Right eye: No discharge.        Left eye: No discharge.     Conjunctiva/sclera: Conjunctivae normal.     Comments: Pinpoint pupils  Pulmonary:     Effort: Pulmonary effort is normal.  Musculoskeletal:     Comments: Right hip is tender to palpation.  Skin:    General: Skin is warm and dry.     Findings: No rash.  Neurological:     General: No focal deficit present.     Mental Status: She is alert.  Psychiatric:        Mood and Affect: Mood normal.        Behavior: Behavior normal.     ED Results / Procedures / Treatments   Labs (all labs ordered are listed, but only abnormal results are displayed) Labs Reviewed  CBC WITH DIFFERENTIAL/PLATELET - Abnormal; Notable for the following components:      Result Value   WBC 10.8 (*)    Hemoglobin 11.6 (*)    Monocytes Absolute 1.4 (*)    All other components within normal limits  BASIC METABOLIC PANEL - Abnormal; Notable for the following components:   Sodium 134 (*)    Chloride 94 (*)    Glucose, Bld 102 (*)    All other components within normal limits  RAPID URINE DRUG SCREEN, HOSP PERFORMED    EKG None  Radiology CT Hip Right Wo Contrast  Result Date: 04/11/2022 CLINICAL DATA:  Right hip pain after falling last night. History of hip fracture. EXAM: CT OF THE RIGHT HIP WITHOUT CONTRAST TECHNIQUE: Multidetector CT imaging of the right hip was performed according to the standard protocol. Multiplanar CT image reconstructions were also generated. RADIATION DOSE REDUCTION: This exam was performed according to the departmental dose-optimization program which includes automated exposure control, adjustment of the mA and/or kV according to patient size and/or use of iterative reconstruction technique. COMPARISON:  Radiographs 04/11/2022 and 07/03/2019. FINDINGS:  Bones/Joint/Cartilage Stable postsurgical changes from previous right femoral intramedullary nail and dynamic screw fixation for a comminuted intertrochanteric fracture. The hardware is intact without loosening. There is no evidence of acute fracture or dislocation. Posttraumatic deformity in the intertrochanteric region appears stable, and there is stable heterotopic ossification superiorly compared with prior radiographs. There is increased subchondral cyst formation and flattening of the femoral head anteriorly suspicious for osteonecrosis. There is a small right  hip joint effusion with mild right hip secondary degenerative changes. Ligaments Suboptimally assessed by CT. Muscles and Tendons Mild generalized muscular atrophy. No focal muscular hematoma or gross tendon rupture identified. Soft tissues No evidence of periarticular fluid collection or unexpected foreign body. Iliofemoral atherosclerosis noted. The visualized internal pelvic contents are unremarkable. IMPRESSION: 1. No evidence of acute right hip fracture or dislocation. 2. Increased subchondral cyst formation and flattening of the femoral head anteriorly suspicious for osteonecrosis. 3. Stable postsurgical changes from previous right femoral intramedullary nail and dynamic screw fixation for a comminuted intertrochanteric fracture. 4. Small right hip joint effusion with mild secondary degenerative changes. Electronically Signed   By: Richardean Sale M.D.   On: 04/11/2022 15:24   DG Hip Unilat W or Wo Pelvis 2-3 Views Right  Result Date: 04/11/2022 CLINICAL DATA:  Right hip pain EXAM: DG HIP (WITH OR WITHOUT PELVIS) 2-3V RIGHT COMPARISON:  07/03/2019 hip radiographs FINDINGS: Postsurgical changes reflecting right hip ORIF with intramedullary nail and screw fixation are again seen. There is a possible impacted fracture of the femoral head/neck junction seen on the second image, although the third image appears normal. Deformity of the greater and  lesser trochanters is similar to the prior study. Femoroacetabular alignment is maintained. No left hip fracture is seen. The SI joints and symphysis pubis are intact. IMPRESSION: Possible subcapital right femoral neck fracture. Recommend CT for further evaluation. Electronically Signed   By: Valetta Mole M.D.   On: 04/11/2022 13:28   CT Head Wo Contrast  Result Date: 04/11/2022 CLINICAL DATA:  Fall from standing.  Head and neck trauma. EXAM: CT HEAD WITHOUT CONTRAST CT CERVICAL SPINE WITHOUT CONTRAST TECHNIQUE: Multidetector CT imaging of the head and cervical spine was performed following the standard protocol without intravenous contrast. Multiplanar CT image reconstructions of the cervical spine were also generated. RADIATION DOSE REDUCTION: This exam was performed according to the departmental dose-optimization program which includes automated exposure control, adjustment of the mA and/or kV according to patient size and/or use of iterative reconstruction technique. COMPARISON:  CT head and cervical spine 02/15/2022. FINDINGS: CT HEAD FINDINGS Brain: No acute hemorrhage. Unchanged chronic small-vessel disease with old lacunar infarct in the right cerebellar hemisphere. Unchanged encephalomalacia in the right anterior temporal lobe from prior infarct or trauma. Cortical gray-white differentiation is otherwise preserved. Prominence of the ventricles and sulci within normal limits for age. No extra-axial collection. Basilar cisterns are patent. Vascular: No hyperdense vessel or unexpected calcification. Skull: No calvarial fracture or suspicious bone lesion. Skull base is unremarkable. Sinuses/Orbits: Unremarkable. Other: None. CT CERVICAL SPINE FINDINGS Alignment: Unchanged trace anterolisthesis of C4 on C5. Skull base and vertebrae: No acute fracture. Normal craniocervical junction. No suspicious bone lesions. Soft tissues and spinal canal: No prevertebral fluid or swelling. No visible canal hematoma. Disc  levels: Unchanged mild cervical spondylosis without high-grade spinal canal stenosis. Upper chest: Emphysema in the lung apices. Other: Atherosclerotic calcifications of the carotid bulbs. IMPRESSION: 1. No acute intracranial abnormality. 2. No acute fracture or traumatic listhesis in the cervical spine. 3. Emphysema (ICD10-J43.9). Electronically Signed   By: Emmit Alexanders M.D.   On: 04/11/2022 13:22   CT Cervical Spine Wo Contrast  Result Date: 04/11/2022 CLINICAL DATA:  Fall from standing.  Head and neck trauma. EXAM: CT HEAD WITHOUT CONTRAST CT CERVICAL SPINE WITHOUT CONTRAST TECHNIQUE: Multidetector CT imaging of the head and cervical spine was performed following the standard protocol without intravenous contrast. Multiplanar CT image reconstructions of the cervical spine were also  generated. RADIATION DOSE REDUCTION: This exam was performed according to the departmental dose-optimization program which includes automated exposure control, adjustment of the mA and/or kV according to patient size and/or use of iterative reconstruction technique. COMPARISON:  CT head and cervical spine 02/15/2022. FINDINGS: CT HEAD FINDINGS Brain: No acute hemorrhage. Unchanged chronic small-vessel disease with old lacunar infarct in the right cerebellar hemisphere. Unchanged encephalomalacia in the right anterior temporal lobe from prior infarct or trauma. Cortical gray-white differentiation is otherwise preserved. Prominence of the ventricles and sulci within normal limits for age. No extra-axial collection. Basilar cisterns are patent. Vascular: No hyperdense vessel or unexpected calcification. Skull: No calvarial fracture or suspicious bone lesion. Skull base is unremarkable. Sinuses/Orbits: Unremarkable. Other: None. CT CERVICAL SPINE FINDINGS Alignment: Unchanged trace anterolisthesis of C4 on C5. Skull base and vertebrae: No acute fracture. Normal craniocervical junction. No suspicious bone lesions. Soft tissues and  spinal canal: No prevertebral fluid or swelling. No visible canal hematoma. Disc levels: Unchanged mild cervical spondylosis without high-grade spinal canal stenosis. Upper chest: Emphysema in the lung apices. Other: Atherosclerotic calcifications of the carotid bulbs. IMPRESSION: 1. No acute intracranial abnormality. 2. No acute fracture or traumatic listhesis in the cervical spine. 3. Emphysema (ICD10-J43.9). Electronically Signed   By: Emmit Alexanders M.D.   On: 04/11/2022 13:22    Procedures Procedures    Medications Ordered in ED Medications - No data to display  ED Course/ Medical Decision Making/ A&P Clinical Course as of 04/11/22 1853  Thu Apr 11, 2022  1631 I spoke with the son on the phone who states that really over the last month and a half she is not been that ambulatory since she broke her ankle and she is nonweightbearing on that ankle.  He does also state that she lives with her brother and she has around-the-clock care with home health aide.  I went over imaging and laboratory studies with him and establish take appropriate discharge plan with him.  He also does mention that she goes through periods where she is very tired and confused and that is from her bipolar disorder and medication regiment. [CF]  [id number] CBC with Differential(!) There is evidence of leukocytosis no other abnormalities. [CF]  123456 Basic metabolic panel(!) Mild hyponatremia and hypochloremia. [CF]  [id number] Imaging of the head and neck were normal.  I personally ordered and interpreted the studies I do agree with radiologist interpretation. [CF]  [id number] DG Hip Unilat W or Wo Pelvis 2-3 Views Right There is questionable hip fracture.  I do agree with radiologist interpretation.  Will follow-up with a CT hip. [CF]  1639 CT Hip Right Wo Contrast No evidence of acute fracture.  There is some evidence of osteonecrosis.  I will have her follow-up with Dr. Aline Brochure for further evaluation in the outpatient setting. [CF]   1712 I spoke with Dr. Aline Brochure who agrees that she can follow-up in clinic. [CF]    Clinical Course User Index [CF] Hendricks Limes, PA-C                             Medical Decision Making HILERY BUSSING is a 67 y.o. female patient who presents to the emergency department today for further evaluation of right hip pain after a fall.  Patient does state that she struck her head and I will evaluate with CT imaging of the head and neck as well.  There is questionable fracture x-ray of the hip.  Will follow-up with CT imaging.  There appears to be osteo necrosis of her right hip and I will have her follow-up with orthopedics in the outpatient setting.  This is highlighted in the ED course.  She does have good follow-up and is safe for discharge.  Strict return precautions were discussed.     Amount and/or Complexity of Data Reviewed Labs: ordered. Decision-making details documented in ED Course. Radiology: ordered. Decision-making details documented in ED Course.   Final Clinical Impression(s) / ED Diagnoses Final diagnoses:  Fall, initial encounter  Pain of right hip  Osteonecrosis of right hip [facility])    Rx / DC Orders ED Discharge Orders     None         Cherrie Gauze 04/11/22 1853    Teressa Lower, MD 04/11/22 2045

## 2022-04-11 NOTE — ED Notes (Signed)
Patient placed in families car, family advised to call non medical assistance to get patient in house if needed

## 2022-04-11 NOTE — ED Notes (Signed)
Patient unable to ambulate at this time

## 2022-04-11 NOTE — ED Triage Notes (Signed)
Patient fell from a standing position nest to her bed last night at 10 pm. Family placed her in bed and she went to sleep. Today caregiver saw patient and she was in pain on her right hip. Patient in the last month was recovering from a right ankle fracture

## 2022-04-15 ENCOUNTER — Telehealth: Payer: Self-pay

## 2022-04-15 NOTE — Telephone Encounter (Signed)
        Patient  visited [facility] on 3/28    Telephone encounter attempt :  1st  A HIPAA compliant voice message was left requesting a return call.  Instructed patient to call back .    [place] [phone] [address], [place], [place] [address] Phone: [phone] Email: [email or web address]

## 2022-04-16 ENCOUNTER — Telehealth: Payer: Self-pay

## 2022-04-16 ENCOUNTER — Ambulatory Visit (INDEPENDENT_AMBULATORY_CARE_PROVIDER_SITE_OTHER): Payer: Medicare Other | Admitting: Orthopedic Surgery

## 2022-04-16 ENCOUNTER — Encounter: Payer: Self-pay | Admitting: Orthopedic Surgery

## 2022-04-16 ENCOUNTER — Other Ambulatory Visit (INDEPENDENT_AMBULATORY_CARE_PROVIDER_SITE_OTHER): Payer: Medicare Other

## 2022-04-16 DIAGNOSIS — M25571 Pain in right ankle and joints of right foot: Secondary | ICD-10-CM | POA: Diagnosis not present

## 2022-04-16 DIAGNOSIS — S82201A Unspecified fracture of shaft of right tibia, initial encounter for closed fracture: Secondary | ICD-10-CM

## 2022-04-16 DIAGNOSIS — L89896 Pressure-induced deep tissue damage of other site: Secondary | ICD-10-CM

## 2022-04-16 DIAGNOSIS — S82401A Unspecified fracture of shaft of right fibula, initial encounter for closed fracture: Secondary | ICD-10-CM | POA: Diagnosis not present

## 2022-04-16 NOTE — Telephone Encounter (Signed)
Patient was waiting under the canopy during lunch, her husband approached me and gave me a note from Dr. Sherrie Sport. The note had that her leg is completely broken and something about a gutter splint. I told them that as soon as Dr. Amedeo Kinsman' assistant comes back from lunch I would see what needs to be done for her. I did ask him to return at 1:30 pm so I would have time to see what we need to do for this patient. I did give the note to Leta when she returned from lunch and she is working on what we need to do.

## 2022-04-16 NOTE — Telephone Encounter (Signed)
Patient has another message in from Dr. Aline Brochure, and office manager has weighed in. Pt care will be better managed by [facility] provider.

## 2022-04-16 NOTE — Telephone Encounter (Signed)
     Patient  visit on 3/28  at [facility] you been able to follow up with your primary care physician? Yes   The patient was or was not able to obtain any needed medicine or equipment. Yes   Are there diet recommendations that you are having difficulty following? Na   Patient expresses understanding of discharge instructions and education provided has no other needs at this time. Yes      [facility] [place] [phone] [address], [place],  [address] Phone: [phone] Email: [email or web address]

## 2022-04-17 ENCOUNTER — Ambulatory Visit: Payer: Medicare Other | Admitting: Family

## 2022-04-17 ENCOUNTER — Telehealth: Payer: Self-pay | Admitting: Orthopedic Surgery

## 2022-04-17 NOTE — Telephone Encounter (Signed)
Dr. Amedeo Kinsman pt Cassidy Smith w/[facility] [phone] called, lvm stating that she needs someone to call her, that no one can tell her anything, not even who her doctor is.

## 2022-04-17 NOTE — Telephone Encounter (Signed)
Spoke with home health nurse, who is very concerned about pt care. States pt is taken care of by her brothers and 2 aides, says she's been trying to get information on who's taking care of her and no one knows. I let her know care has been transitioned to Dr. Sharol Given. Verbalized understanding.

## 2022-04-21 ENCOUNTER — Encounter: Payer: Self-pay | Admitting: Orthopedic Surgery

## 2022-04-21 NOTE — Progress Notes (Signed)
Office Visit Note   Patient: Cassidy Smith           Date of Birth: [birth year]           MRN: [id number] Visit Date: 04/16/2022              Requested by: Toma DeitersHasanaj, Xaje A, MD [address] [place],  [place] [id number] PCP: Toma DeitersHasanaj, Xaje A, MD  Chief Complaint  Patient presents with   Right Ankle - Follow-up      HPI: Patient is a 67 year old woman who is seen after an acute fall.  Patient states that she shattered her ankle.  Patient is 2 months status post previous ankle fracture.  Assessment & Plan: Visit Diagnoses:  1. Pain in right ankle and joints of right foot   2. Pressure injury of deep tissue of dorsum of right foot   3. Closed fracture of right tibia and fibula, initial encounter     Plan: Patient is placed in a posterior and sugar-tong splint after the fracture was reduced.  We will follow-up in 1 week to change the wound dressing.  Patient was pain-free after the splint was applied.  Follow-Up Instructions: Return in about 1 week (around 04/23/2022).   Ortho Exam  Patient is alert, oriented, no adenopathy, well-dressed, normal affect, normal respiratory effort. Examination patient has a deformity from a distal tib-fib fracture that is acute her ankle is unstable.  She has an ischemic ulcer over the lateral foot posterior heel and dorsum of the foot.  The dorsal foot ischemic ulcer involves most of the dorsum of the foot.  Patient underwent closed reduction and splinting with a posterior and sugar-tong splint.  Imaging: No results found.      Labs: Lab Results  Component Value Date   HGBA1C 5.0 06/10/2017   HGBA1C 5.4 05/13/2013   ESRSEDRATE 2 05/13/2013   LABURIC 2.4 04/11/2017   REPTSTATUS 02/20/2022 FINAL 02/20/2022   REPTSTATUS 02/25/2022 FINAL 02/20/2022   GRAMSTAIN  02/20/2022    NO ORGANISMS SEEN WBC PRESENT, PREDOMINANTLY MONONUCLEAR CYTOSPIN SMEAR Performed at [facility], [address] [place], [place] [address]    CULT  02/20/2022     NO GROWTH 5 DAYS Performed at [facility], [address] [place], [place] [address]    [facility] 07/25/2020     Lab Results  Component Value Date   ALBUMIN 2.0 (L) 02/21/2022   ALBUMIN 3.2 (L) 08/12/2020   ALBUMIN 3.3 (L) 07/25/2020    Lab Results  Component Value Date   MG 1.9 02/24/2022   MG 1.5 (L) 02/23/2022   MG 1.6 (L) 02/22/2022   Lab Results  Component Value Date   VD25OH 49.2 03/06/2017    No results found for: "PREALBUMIN"    Latest Ref Rng & Units 04/11/2022   12:56 PM 02/24/2022    4:52 AM 02/23/2022    6:11 AM  CBC EXTENDED  WBC 4.0 - 10.5 K/uL 10.8  6.5  6.5   RBC 3.87 - 5.11 MIL/uL 3.93  2.53  2.60   Hemoglobin 12.0 - 15.0 g/dL 21.311.6  8.6  8.4   HCT 08.636.0 - 46.0 % 36.7  25.2  26.0   Platelets 150 - 400 K/uL 228  168  111   NEUT# 1.7 - 7.7 K/uL 5.7     Lymph# 0.7 - 4.0 K/uL 3.6        There is no height or weight on file to calculate BMI.  Orders:  Orders Placed This Encounter  Procedures   XR Ankle Complete Right   No orders of the defined types were placed in this encounter.    Procedures: No procedures performed  Clinical Data: No additional findings.  ROS:  All other systems negative, except as noted in the HPI. Review of Systems  Objective: Vital Signs: There were no vitals taken for this visit.  Specialty Comments:  No specialty comments available.  PMFS History: Patient Active Problem List   Diagnosis Date Noted   Acute heart failure with preserved ejection fraction (HFpEF) 02/20/2022   Acute respiratory failure with hypoxia 02/20/2022   AKI (acute kidney injury) 02/15/2022   Aspiration pneumonia 02/15/2022   Leukocytosis 02/15/2022   Falls frequently 02/15/2022   SIRS (systemic inflammatory response syndrome) 08/12/2020   Mild protein malnutrition 08/12/2020   Sepsis 07/25/2020   Sepsis with acute hypoxic respiratory failure without septic shock    Dementia without behavioral disturbance     Palliative care by specialist    Goals of care, counseling/discussion    Pneumonia of right lower lobe due to infectious organism 11/06/2019   Intracerebral hematoma 07/04/2019   Closed head injury 07/03/2019   Thrombocytopenia 07/03/2019   Thyrotoxicosis, unspecified with thyrotoxic crisis or storm 01/15/2019   History of syndrome of inappropriate antidiuretic hormone (SIADH) 01/15/2019   UTI (urinary tract infection) 01/04/2018   Neuromuscular disorder    Dysuria 10/30/2017   Fever 08/15/2017   Hypokalemia 08/15/2017   Altered mental state 08/15/2017   Acute metabolic encephalopathy 08/15/2017   Valproic acid toxicity 08/15/2017   Rectal cancer 07/10/2017   Chronic deep vein thrombosis (DVT) of distal vein of lower extremity 07/10/2017   Altered mental status    Ischemic stroke    GERD (gastroesophageal reflux disease) 05/12/2017   GI bleed    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   Dysplastic rectal polyp    Protein-calorie malnutrition, severe 04/07/2017   Pressure injury of skin 04/04/2017   Anticoagulated 04/03/2017   Stool bloody 04/03/2017   Current every day smoker 04/03/2017   Rectal bleeding    Chronic diarrhea    Hyperthyroidism    S/P ORIF (open reduction internal fixation) fracture right hip IM nail 03/07/17 03/24/2017   Hypomagnesemia 03/07/2017   Fall    Closed intertrochanteric fracture of hip, right, initial encounter 03/06/2017   Radial styloid fracture: right 03/06/2017   Normocytic anemia 03/06/2017   Orthostatic hypotension 06/07/2015   Lower urinary tract infectious disease 06/07/2015   Rhabdomyolysis 06/07/2015   White matter abnormality on MRI of brain 05/14/2013   History of depression 05/14/2013   Hx of anxiety disorder 05/14/2013   Bipolar I disorder, most recent episode (or current) manic 05/14/2013   Bipolar 1 disorder 05/14/2013   Hyponatremia 05/12/2013   Lacunar infarct, acute 05/12/2013   Past Medical History:   Diagnosis Date   Anxiety    Bipolar 1 disorder    Chronic deep vein thrombosis (DVT) of distal vein of lower extremity 07/10/2017   Chronic hip pain    Depression    GI bleed    Headache(784.0)    History of UTI/bladder spams.    Hyperthyroidism    Hypokalemia 08/15/2017   Intracerebral hematoma 07/04/2019   Ischemic stroke    Neuromuscular disorder    shaking of hands    Pneumonia of right lower lobe due to infectious organism 11/06/2019   Radial styloid fracture: right 03/06/2017   Rhabdomyolysis 06/07/2015   SIADH (syndrome of inappropriate ADH  production) 04/08/2017   Thyrotoxicosis, unspecified with thyrotoxic crisis or storm 01/15/2019    Family History  Problem Relation Age of Onset   Breast cancer Mother    Cancer - Other Mother    Alcoholism Father    Colon cancer Neg Hx    Colon polyps Neg Hx     Past Surgical History:  Procedure Laterality Date   COLONOSCOPY WITH PROPOFOL N/A 04/05/2017   Procedure: COLONOSCOPY WITH PROPOFOL;  Surgeon: Malissa Hippo, MD;  [facility];  Service: Endoscopy;  Laterality: N/A;   INTRAMEDULLARY (IM) NAIL INTERTROCHANTERIC Right 03/07/2017   Procedure: OPEN TREATMENT INTERNAL FIXATION RIGHT HIP WITH GAMA INTRAMEDULARY NAIL;  Surgeon: Vickki Hearing, MD;  Location: [facility];  Service: Orthopedics;  Laterality: Right;   MULTIPLE EXTRACTIONS WITH ALVEOLOPLASTY N/A 10/30/2012   Procedure: MULTIPLE EXTRACION 5, 6, 8, 9, 10 ,18, 19, 31 WITH MAXILLARY RIGHT AND LEFT  ALVEOLOPLASTY REDUCE MAXILLARY LEFT TUBEROSITY;  Surgeon: Georgia Lopes, DDS;  Location: [facility];  Service: Oral Surgery;  Laterality: N/A;   POLYPECTOMY  04/05/2017   Procedure: POLYPECTOMY;  Surgeon: Malissa Hippo, MD;  [facility];  Service: Endoscopy;;  recto-sigmoid, rectum   Social History   Occupational History   Not on file  Tobacco Use   Smoking status: Former    Packs/day: 0.50    Years: 20.00    Additional pack years: 0.00    Total  pack years: 10.00    Types: Cigarettes    Quit date: 05/05/2017    Years since quitting: 4.9   Smokeless tobacco: Never   Tobacco comments:    patient states she quit one week ago  Vaping Use   Vaping Use: Never used  Substance and Sexual Activity   Alcohol use: No   Drug use: No   Sexual activity: Not on file

## 2022-04-23 ENCOUNTER — Other Ambulatory Visit (INDEPENDENT_AMBULATORY_CARE_PROVIDER_SITE_OTHER): Payer: Medicare Other

## 2022-04-23 ENCOUNTER — Ambulatory Visit (INDEPENDENT_AMBULATORY_CARE_PROVIDER_SITE_OTHER): Payer: Medicare Other | Admitting: Orthopedic Surgery

## 2022-04-23 DIAGNOSIS — S82891G Other fracture of right lower leg, subsequent encounter for closed fracture with delayed healing: Secondary | ICD-10-CM

## 2022-04-30 ENCOUNTER — Encounter: Payer: Self-pay | Admitting: Family

## 2022-04-30 ENCOUNTER — Other Ambulatory Visit: Payer: Self-pay

## 2022-04-30 ENCOUNTER — Ambulatory Visit (INDEPENDENT_AMBULATORY_CARE_PROVIDER_SITE_OTHER): Payer: Medicare Other | Admitting: Family

## 2022-04-30 DIAGNOSIS — S82891G Other fracture of right lower leg, subsequent encounter for closed fracture with delayed healing: Secondary | ICD-10-CM

## 2022-04-30 NOTE — Progress Notes (Signed)
Office Visit Note   Patient: Cassidy Smith           Date of Birth: 1955-04-28           MRN: [id number] Visit Date: 04/30/2022              Requested by: Toma Deiters, MD [address] [place],  [place] [address] PCP: Toma Deiters, MD  Chief Complaint  Patient presents with  . Right Ankle - Follow-up, Fracture      HPI: The patient is a 67 year old woman who is seen today in follow-up for right ankle fracture as well as dorsal and lateral foot ulcers on the right.  Has been in a sugar-tong posterior splint for the last 2 weeks today she complains of excruciating pain.  History limited by her dementia.  Her family who accompanies the visit reports she has not complained to them of pain  Assessment & Plan: Visit Diagnoses:  1. Closed fracture of right ankle with delayed healing, subsequent encounter     Plan: ***  Follow-Up Instructions: No follow-ups on file.   Ortho Exam  Patient is alert, oriented, no adenopathy, well-dressed, normal affect, normal respiratory effort. On examination of the right foot and ankle there is moderate edema with pitting.  The dorsal and lateral ulcers appear largely unchanged from last week.  Covered with eschar there is no surrounding erythema maceration or drainage  Imaging: No results found. No images are attached to the encounter.  Labs: Lab Results  Component Value Date   HGBA1C 5.0 06/10/2017   HGBA1C 5.4 05/13/2013   ESRSEDRATE 2 05/13/2013   LABURIC 2.4 04/11/2017   REPTSTATUS 02/20/2022 FINAL 02/20/2022   REPTSTATUS 02/25/2022 FINAL 02/20/2022   GRAMSTAIN  02/20/2022    NO ORGANISMS SEEN WBC PRESENT, PREDOMINANTLY MONONUCLEAR CYTOSPIN SMEAR Performed at [facility], [address], [place], [place] [address]    CULT  02/20/2022    NO GROWTH 5 DAYS Performed at [facility], [address], [place], [place] [address]    [facility] ESCHERICHIA COLI (A) 07/25/2020     Lab Results  Component Value Date   ALBUMIN  2.0 (L) 02/21/2022   ALBUMIN 3.2 (L) 08/12/2020   ALBUMIN 3.3 (L) 07/25/2020    Lab Results  Component Value Date   MG 1.9 02/24/2022   MG 1.5 (L) 02/23/2022   MG 1.6 (L) 02/22/2022   Lab Results  Component Value Date   VD25OH 49.2 03/06/2017    No results found for: "PREALBUMIN"    Latest Ref Rng & Units 04/11/2022   12:56 PM 02/24/2022    4:52 AM 02/23/2022    6:11 AM  CBC EXTENDED  WBC 4.0 - 10.5 K/uL 10.8  6.5  6.5   RBC 3.87 - 5.11 MIL/uL 3.93  2.53  2.60   Hemoglobin 12.0 - 15.0 g/dL 21.3  8.6  8.4   HCT 08.6 - 46.0 % 36.7  25.2  26.0   Platelets 150 - 400 K/uL 228  168  111   NEUT# 1.7 - 7.7 K/uL 5.7     Lymph# 0.7 - 4.0 K/uL 3.6        There is no height or weight on file to calculate BMI.  Orders:  Orders Placed This Encounter  Procedures  . XR Ankle Complete Right   No orders of the defined types were placed in this encounter.    Procedures: No procedures performed  Clinical Data: No additional findings.  ROS:  All other systems negative, except as noted in the HPI. Review of Systems  Objective: Vital Signs: There were no vitals taken for this visit.  Specialty Comments:  No specialty comments available.  PMFS History: Patient Active Problem List   Diagnosis Date Noted  . Acute heart failure with preserved ejection fraction (HFpEF) 02/20/2022  . Acute respiratory failure with hypoxia 02/20/2022  . AKI (acute kidney injury) 02/15/2022  . Aspiration pneumonia 02/15/2022  . Leukocytosis 02/15/2022  . Falls frequently 02/15/2022  . SIRS (systemic inflammatory response syndrome) 08/12/2020  . Mild protein malnutrition 08/12/2020  . Sepsis 07/25/2020  . Sepsis with acute hypoxic respiratory failure without septic shock   . Dementia without behavioral disturbance   . Palliative care by specialist   . Goals of care, counseling/discussion   . Pneumonia of right lower lobe due to infectious organism 11/06/2019  . Intracerebral hematoma  07/04/2019  . Closed head injury 07/03/2019  . Thrombocytopenia 07/03/2019  . Thyrotoxicosis, unspecified with thyrotoxic crisis or storm 01/15/2019  . History of syndrome of inappropriate antidiuretic hormone (SIADH) 01/15/2019  . UTI (urinary tract infection) 01/04/2018  . Neuromuscular disorder   . Dysuria 10/30/2017  . Fever 08/15/2017  . Hypokalemia 08/15/2017  . Altered mental state 08/15/2017  . Acute metabolic encephalopathy 08/15/2017  . Valproic acid toxicity 08/15/2017  . Rectal cancer 07/10/2017  . Chronic deep vein thrombosis (DVT) of distal vein of lower extremity 07/10/2017  . Altered mental status   . Ischemic stroke   . GERD (gastroesophageal reflux disease) 05/12/2017  . GI bleed   . 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 Dysplastic rectal polyp   . Protein-calorie malnutrition, severe 04/07/2017  . Pressure injury of skin 04/04/2017  . Anticoagulated 04/03/2017  . Stool bloody 04/03/2017  . Current every day smoker 04/03/2017  . Rectal bleeding   . Chronic diarrhea   . Hyperthyroidism   . S/P ORIF (open reduction internal fixation) fracture right hip IM nail 03/07/17 03/24/2017  . Hypomagnesemia 03/07/2017  . Fall   . Closed intertrochanteric fracture of hip, right, initial encounter 03/06/2017  . Radial styloid fracture: right 03/06/2017  . Normocytic anemia 03/06/2017  . Orthostatic hypotension 06/07/2015  . Lower urinary tract infectious disease 06/07/2015  . Rhabdomyolysis 06/07/2015  . White matter abnormality on MRI of brain 05/14/2013  . History of depression 05/14/2013  . Hx of anxiety disorder 05/14/2013  . Bipolar I disorder, most recent episode (or current) manic 05/14/2013  . Bipolar 1 disorder 05/14/2013  . Hyponatremia 05/12/2013  . Lacunar infarct, acute 05/12/2013   Past Medical History:  Diagnosis Date  . Anxiety   . Bipolar 1 disorder   . Chronic deep vein thrombosis (DVT) of distal vein of lower extremity  07/10/2017  . Chronic hip pain   . Depression   . GI bleed   . Headache(784.0)   . History of UTI/bladder spams.   . Hyperthyroidism   . Hypokalemia 08/15/2017  . Intracerebral hematoma 07/04/2019  . Ischemic stroke   . Neuromuscular disorder    shaking of hands   . Pneumonia of right lower lobe due to infectious organism 11/06/2019  . Radial styloid fracture: right 03/06/2017  . Rhabdomyolysis 06/07/2015  . 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 Thyrotoxicosis, unspecified with thyrotoxic crisis or storm 01/15/2019    Family History  Problem Relation Age of Onset  . Breast cancer Mother   . Cancer - Other Mother   . Alcoholism Father   .  Colon cancer Neg Hx   . Colon polyps Neg Hx     Past Surgical History:  Procedure Laterality Date  . COLONOSCOPY WITH PROPOFOL N/A 04/05/2017   Procedure: COLONOSCOPY WITH PROPOFOL;  Surgeon: Malissa Hippo, MD;  [facility];  Service: Endoscopy;  Laterality: N/A;  . INTRAMEDULLARY (IM) NAIL INTERTROCHANTERIC Right 03/07/2017   Procedure: OPEN TREATMENT INTERNAL FIXATION RIGHT HIP WITH GAMA INTRAMEDULARY NAIL;  Surgeon: Vickki Hearing, MD;  Location: [facility];  Service: Orthopedics;  Laterality: Right;  . MULTIPLE EXTRACTIONS WITH ALVEOLOPLASTY N/A 10/30/2012   Procedure: MULTIPLE EXTRACION 5, 6, 8, 9, 10 ,18, 19, 31 WITH MAXILLARY RIGHT AND LEFT  ALVEOLOPLASTY REDUCE MAXILLARY LEFT TUBEROSITY;  Surgeon: Georgia Lopes, DDS;  Location: [facility];  Service: Oral Surgery;  Laterality: N/A;  . POLYPECTOMY  04/05/2017   Procedure: POLYPECTOMY;  Surgeon: Malissa Hippo, MD;  [facility];  Service: Endoscopy;;  recto-sigmoid, rectum   Social History   Occupational History  . Not on file  Tobacco Use  . Smoking status: Former    Packs/day: 0.50    Years: 20.00    Additional pack years: 0.00    Total pack years: 10.00    Types: Cigarettes    Quit date: 05/05/2017    Years since quitting: 4.9  .  Smokeless tobacco: Never  . Tobacco comments:    patient states she quit one week ago  Vaping Use  . Vaping Use: Never used  Substance and Sexual Activity  . Alcohol use: No  . Drug use: No  . Sexual activity: Not on file

## 2022-05-05 ENCOUNTER — Encounter: Payer: Self-pay | Admitting: Orthopedic Surgery

## 2022-05-05 NOTE — Progress Notes (Signed)
Office Visit Note   Patient: Cassidy Smith           Date of Birth: June 14, 1955           MRN: [id number] Visit Date: 04/23/2022              Requested by: Toma Deiters, MD [address] [place],  [place] [address] PCP: Toma Deiters, MD  Chief Complaint  Patient presents with   Right Ankle - Fracture, Follow-up      HPI: Patient is a 67 year old woman status post spiral distal tibia and fibular fracture.  Patient is currently in a splint she feels well has no pain there is no skin breakdown posteriorly, medially, laterally.  Assessment & Plan: Visit Diagnoses:  1. Closed fracture of right ankle with delayed healing, subsequent encounter     Plan: Continue with splinting and nonweightbearing.  Repeat three-view radiographs of the right ankle at follow-up.  Follow-Up Instructions: No follow-ups on file.Gaylord Shih Exam  Patient is alert, oriented, no adenopathy, well-dressed, normal affect, normal respiratory effort. Examination there is no skin breakdown.  There is no cellulitis.  No posterior ulcers.  Imaging: No results found. No images are attached to the encounter.  Labs: Lab Results  Component Value Date   HGBA1C 5.0 06/10/2017   HGBA1C 5.4 05/13/2013   ESRSEDRATE 2 05/13/2013   LABURIC 2.4 04/11/2017   REPTSTATUS 02/20/2022 FINAL 02/20/2022   REPTSTATUS 02/25/2022 FINAL 02/20/2022   GRAMSTAIN  02/20/2022    NO ORGANISMS SEEN WBC PRESENT, PREDOMINANTLY MONONUCLEAR CYTOSPIN SMEAR Performed at [facility], [address] [place], [place] [address]    CULT  02/20/2022    NO GROWTH 5 DAYS Performed at [facility], [address] [place], [place] [address]    [facility] ESCHERICHIA COLI (A) 07/25/2020     Lab Results  Component Value Date   ALBUMIN 2.0 (L) 02/21/2022   ALBUMIN 3.2 (L) 08/12/2020   ALBUMIN 3.3 (L) 07/25/2020    Lab Results  Component Value Date   MG 1.9 02/24/2022   MG 1.5 (L) 02/23/2022   MG 1.6 (L) 02/22/2022   Lab  Results  Component Value Date   VD25OH 49.2 03/06/2017    No results found for: "PREALBUMIN"    Latest Ref Rng & Units 04/11/2022   12:56 PM 02/24/2022    4:52 AM 02/23/2022    6:11 AM  CBC EXTENDED  WBC 4.0 - 10.5 K/uL 10.8  6.5  6.5   RBC 3.87 - 5.11 MIL/uL 3.93  2.53  2.60   Hemoglobin 12.0 - 15.0 g/dL 21.3  8.6  8.4   HCT 08.6 - 46.0 % 36.7  25.2  26.0   Platelets 150 - 400 K/uL 228  168  111   NEUT# 1.7 - 7.7 K/uL 5.7     Lymph# 0.7 - 4.0 K/uL 3.6        There is no height or weight on file to calculate BMI.  Orders:  Orders Placed This Encounter  Procedures   XR Ankle Complete Right   No orders of the defined types were placed in this encounter.    Procedures: No procedures performed  Clinical Data: No additional findings.  ROS:  All other systems negative, except as noted in the HPI. Review of Systems  Objective: Vital Signs: There were no vitals taken for this visit.  Specialty Comments:  No specialty comments available.  PMFS History: Patient Active Problem List   Diagnosis Date  Noted   Acute heart failure with preserved ejection fraction (HFpEF) 02/20/2022   Acute respiratory failure with hypoxia 02/20/2022   AKI (acute kidney injury) 02/15/2022   Aspiration pneumonia 02/15/2022   Leukocytosis 02/15/2022   Falls frequently 02/15/2022   SIRS (systemic inflammatory response syndrome) 08/12/2020   Mild protein malnutrition 08/12/2020   Sepsis 07/25/2020   Sepsis with acute hypoxic respiratory failure without septic shock    Dementia without behavioral disturbance    Palliative care by specialist    Goals of care, counseling/discussion    Pneumonia of right lower lobe due to infectious organism 11/06/2019   Intracerebral hematoma 07/04/2019   Closed head injury 07/03/2019   Thrombocytopenia 07/03/2019   Thyrotoxicosis, unspecified with thyrotoxic crisis or storm 01/15/2019   History of syndrome of inappropriate antidiuretic hormone (SIADH)  01/15/2019   UTI (urinary tract infection) 01/04/2018   Neuromuscular disorder    Dysuria 10/30/2017   Fever 08/15/2017   Hypokalemia 08/15/2017   Altered mental state 08/15/2017   Acute metabolic encephalopathy 08/15/2017   Valproic acid toxicity 08/15/2017   Rectal cancer 07/10/2017   Chronic deep vein thrombosis (DVT) of distal vein of lower extremity 07/10/2017   Altered mental status    Ischemic stroke    GERD (gastroesophageal reflux disease) 05/12/2017   GI bleed    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   Dysplastic rectal polyp    Protein-calorie malnutrition, severe 04/07/2017   Pressure injury of skin 04/04/2017   Anticoagulated 04/03/2017   Stool bloody 04/03/2017   Current every day smoker 04/03/2017   Rectal bleeding    Chronic diarrhea    Hyperthyroidism    S/P ORIF (open reduction internal fixation) fracture right hip IM nail 03/07/17 03/24/2017   Hypomagnesemia 03/07/2017   Fall    Closed intertrochanteric fracture of hip, right, initial encounter 03/06/2017   Radial styloid fracture: right 03/06/2017   Normocytic anemia 03/06/2017   Orthostatic hypotension 06/07/2015   Lower urinary tract infectious disease 06/07/2015   Rhabdomyolysis 06/07/2015   White matter abnormality on MRI of brain 05/14/2013   History of depression 05/14/2013   Hx of anxiety disorder 05/14/2013   Bipolar I disorder, most recent episode (or current) manic 05/14/2013   Bipolar 1 disorder 05/14/2013   Hyponatremia 05/12/2013   Lacunar infarct, acute 05/12/2013   Past Medical History:  Diagnosis Date   Anxiety    Bipolar 1 disorder    Chronic deep vein thrombosis (DVT) of distal vein of lower extremity 07/10/2017   Chronic hip pain    Depression    GI bleed    Headache(784.0)    History of UTI/bladder spams.    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   Thyrotoxicosis, unspecified with thyrotoxic crisis or storm 01/15/2019    Family History  Problem Relation Age of Onset   Breast cancer Mother    Cancer - Other Mother    Alcoholism Father    Colon cancer Neg Hx    Colon polyps Neg Hx     Past Surgical History:  Procedure Laterality Date   COLONOSCOPY WITH PROPOFOL N/A 04/05/2017   Procedure: COLONOSCOPY WITH PROPOFOL;  Surgeon: Malissa Hippo, MD;  [facility];  Service: Endoscopy;  Laterality: N/A;   INTRAMEDULLARY (IM) NAIL INTERTROCHANTERIC Right 03/07/2017   Procedure: OPEN TREATMENT INTERNAL FIXATION RIGHT HIP WITH GAMA INTRAMEDULARY NAIL;  Surgeon: Vickki Hearing, MD;  Location: [facility];  Service: Orthopedics;  Laterality: Right;   MULTIPLE EXTRACTIONS WITH ALVEOLOPLASTY N/A 10/30/2012   Procedure: MULTIPLE EXTRACION 5, 6, 8, 9, 10 ,18, 19, 31 WITH MAXILLARY RIGHT AND LEFT  ALVEOLOPLASTY REDUCE MAXILLARY LEFT TUBEROSITY;  Surgeon: Georgia Lopes, DDS;  Location: [facility];  Service: Oral Surgery;  Laterality: N/A;   POLYPECTOMY  04/05/2017   Procedure: POLYPECTOMY;  Surgeon: Malissa Hippo, MD;  [facility];  Service: Endoscopy;;  recto-sigmoid, rectum   Social History   Occupational History   Not on file  Tobacco Use   Smoking status: Former    Packs/day: 0.50    Years: 20.00    Additional pack years: 0.00    Total pack years: 10.00    Types: Cigarettes    Quit date: 05/05/2017    Years since quitting: 5.0   Smokeless tobacco: Never   Tobacco comments:    patient states she quit one week ago  Vaping Use   Vaping Use: Never used  Substance and Sexual Activity   Alcohol use: No   Drug use: No   Sexual activity: Not on file

## 2022-05-14 ENCOUNTER — Other Ambulatory Visit (INDEPENDENT_AMBULATORY_CARE_PROVIDER_SITE_OTHER): Payer: Medicare Other

## 2022-05-14 ENCOUNTER — Encounter: Payer: Self-pay | Admitting: Family

## 2022-05-14 ENCOUNTER — Ambulatory Visit (INDEPENDENT_AMBULATORY_CARE_PROVIDER_SITE_OTHER): Payer: Medicare Other | Admitting: Family

## 2022-05-14 DIAGNOSIS — S82891G Other fracture of right lower leg, subsequent encounter for closed fracture with delayed healing: Secondary | ICD-10-CM

## 2022-05-14 NOTE — Progress Notes (Signed)
Office Visit Note   Patient: Cassidy Smith           Date of Birth: 24-Nov-1955           MRN: [id number] Visit Date: 05/14/2022              Requested by: Toma Deiters, MD [address] [place],  [place] [address] PCP: Toma Deiters, MD  No chief complaint on file.     HPI: The patient is a 67 year old woman who is seen in follow-up for right ankle fracture.  She has dorsal and lateral foot ulcerations as well.  She has been in a sugar-tong posterior splint for the last 2 weeks.  Today she reports a reduction in her pain.  It comes and goes.  Does have a history of significant dementia.  Lives at home with family who accompanied the visit.  Assessment & Plan: Visit Diagnoses:  1. Closed fracture of right ankle with delayed healing, subsequent encounter     Plan: Placed in a cam walker today.  Discussed the importance of continuing to nonweightbearing.  Continue cam around-the-clock.  Will remove this for daily Dial soap cleansing dry dressing changes Follow-Up Instructions: No follow-ups on file.   Ortho Exam  Patient is alert, oriented, no adenopathy, well-dressed, normal affect, normal respiratory effort. On examination of the right foot and ankle the lateral foot has 3 areas of eschar there is no open drainage erythema or maceration areas of eschar are dime size.  Over the dorsum of her foot she has a significant ulcer with an eschar.  There is some unroofing of the eschar over the medial column with granulation.  There is no active drainage no bleeding no surrounding erythema or depth.  Imaging: No results found. No images are attached to the encounter.  Labs: Lab Results  Component Value Date   HGBA1C 5.0 06/10/2017   HGBA1C 5.4 05/13/2013   ESRSEDRATE 2 05/13/2013   LABURIC 2.4 04/11/2017   REPTSTATUS 02/20/2022 FINAL 02/20/2022   REPTSTATUS 02/25/2022 FINAL 02/20/2022   GRAMSTAIN  02/20/2022    NO ORGANISMS SEEN WBC PRESENT, PREDOMINANTLY MONONUCLEAR  CYTOSPIN SMEAR Performed at [facility], [address], [place], [place] [address]    CULT  02/20/2022    NO GROWTH 5 DAYS Performed at [facility], [address], [place], [place] [address]    [facility] ESCHERICHIA COLI (A) 07/25/2020     Lab Results  Component Value Date   ALBUMIN 2.0 (L) 02/21/2022   ALBUMIN 3.2 (L) 08/12/2020   ALBUMIN 3.3 (L) 07/25/2020    Lab Results  Component Value Date   MG 1.9 02/24/2022   MG 1.5 (L) 02/23/2022   MG 1.6 (L) 02/22/2022   Lab Results  Component Value Date   VD25OH 49.2 03/06/2017    No results found for: "PREALBUMIN"    Latest Ref Rng & Units 04/11/2022   12:56 PM 02/24/2022    4:52 AM 02/23/2022    6:11 AM  CBC EXTENDED  WBC 4.0 - 10.5 K/uL 10.8  6.5  6.5   RBC 3.87 - 5.11 MIL/uL 3.93  2.53  2.60   Hemoglobin 12.0 - 15.0 g/dL 95.2  8.6  8.4   HCT 84.1 - 46.0 % 36.7  25.2  26.0   Platelets 150 - 400 K/uL 228  168  111   NEUT# 1.7 - 7.7 K/uL 5.7     Lymph# 0.7 - 4.0 K/uL 3.6  There is no height or weight on file to calculate BMI.  Orders:  Orders Placed This Encounter  Procedures   XR Ankle Complete Right   No orders of the defined types were placed in this encounter.    Procedures: No procedures performed  Clinical Data: No additional findings.  ROS:  All other systems negative, except as noted in the HPI. Review of Systems  Constitutional: Negative.   Musculoskeletal:  Positive for arthralgias and joint swelling. Negative for myalgias.  Skin:  Positive for wound. Negative for color change.    Objective: Vital Signs: There were no vitals taken for this visit.  Specialty Comments:  No specialty comments available.  PMFS History: Patient Active Problem List   Diagnosis Date Noted   Acute heart failure with preserved ejection fraction (HFpEF) (HCC) 02/20/2022   Acute respiratory failure with hypoxia (HCC) 02/20/2022   AKI (acute kidney injury) (HCC) 02/15/2022   Aspiration pneumonia (HCC)  02/15/2022   Leukocytosis 02/15/2022   Falls frequently 02/15/2022   SIRS (systemic inflammatory response syndrome) (HCC) 08/12/2020   Mild protein malnutrition (HCC) 08/12/2020   Sepsis (HCC) 07/25/2020   Sepsis with acute hypoxic respiratory failure without septic shock (HCC)    Dementia without behavioral disturbance (HCC)    Palliative care by specialist    Goals of care, counseling/discussion    Pneumonia of right lower lobe due to infectious organism 11/06/2019   Intracerebral hematoma (HCC) 07/04/2019   Closed head injury 07/03/2019   Thrombocytopenia (HCC) 07/03/2019   Thyrotoxicosis, unspecified with thyrotoxic crisis or storm 01/15/2019   History of syndrome of inappropriate antidiuretic hormone (SIADH) 01/15/2019   UTI (urinary tract infection) 01/04/2018   Neuromuscular disorder (HCC)    Dysuria 10/30/2017   Fever 08/15/2017   Hypokalemia 08/15/2017   Altered mental state 08/15/2017   Acute metabolic encephalopathy 08/15/2017   Valproic acid toxicity 08/15/2017   Rectal cancer (HCC) 07/10/2017   Chronic deep vein thrombosis (DVT) of distal vein of lower extremity (HCC) 07/10/2017   Altered mental status    Ischemic stroke (HCC)    GERD (gastroesophageal reflux disease) 05/12/2017   GI bleed    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   Dysplastic rectal polyp    Protein-calorie malnutrition, severe 04/07/2017   Pressure injury of skin 04/04/2017   Anticoagulated 04/03/2017   Stool bloody 04/03/2017   Current every day smoker 04/03/2017   Rectal bleeding    Chronic diarrhea    Hyperthyroidism    S/P ORIF (open reduction internal fixation) fracture right hip IM nail 03/07/17 03/24/2017   Hypomagnesemia 03/07/2017   Fall    Closed intertrochanteric fracture of hip, right, initial encounter (HCC) 03/06/2017   Radial styloid fracture: right 03/06/2017   Normocytic anemia 03/06/2017   Orthostatic hypotension 06/07/2015   Lower urinary tract  infectious disease 06/07/2015   Rhabdomyolysis 06/07/2015   White matter abnormality on MRI of brain 05/14/2013   History of depression 05/14/2013   Hx of anxiety disorder 05/14/2013   Bipolar I disorder, most recent episode (or current) manic (HCC) 05/14/2013   Bipolar 1 disorder (HCC) 05/14/2013   Hyponatremia 05/12/2013   Lacunar infarct, acute (HCC) 05/12/2013   Past Medical History:  Diagnosis Date   Anxiety    Bipolar 1 disorder (HCC)    Chronic deep vein thrombosis (DVT) of distal vein of lower extremity (HCC) 07/10/2017   Chronic hip pain    Depression    GI bleed    Headache(784.0)  History of UTI/bladder spams.    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   Thyrotoxicosis, unspecified with thyrotoxic crisis or storm 01/15/2019    Family History  Problem Relation Age of Onset   Breast cancer Mother    Cancer - Other Mother    Alcoholism Father    Colon cancer Neg Hx    Colon polyps Neg Hx     Past Surgical History:  Procedure Laterality Date   COLONOSCOPY WITH PROPOFOL N/A 04/05/2017   Procedure: COLONOSCOPY WITH PROPOFOL;  Surgeon: Malissa Hippo, MD;  [facility];  Service: Endoscopy;  Laterality: N/A;   INTRAMEDULLARY (IM) NAIL INTERTROCHANTERIC Right 03/07/2017   Procedure: OPEN TREATMENT INTERNAL FIXATION RIGHT HIP WITH GAMA INTRAMEDULARY NAIL;  Surgeon: Vickki Hearing, MD;  Location: [facility];  Service: Orthopedics;  Laterality: Right;   MULTIPLE EXTRACTIONS WITH ALVEOLOPLASTY N/A 10/30/2012   Procedure: MULTIPLE EXTRACION 5, 6, 8, 9, 10 ,18, 19, 31 WITH MAXILLARY RIGHT AND LEFT  ALVEOLOPLASTY REDUCE MAXILLARY LEFT  TUBEROSITY;  Surgeon: Georgia Lopes, DDS;  Location: [facility];  Service: Oral Surgery;  Laterality: N/A;   POLYPECTOMY  04/05/2017   Procedure: POLYPECTOMY;  Surgeon: Malissa Hippo, MD;  [facility]: [facility];  Service: Endoscopy;;  recto-sigmoid, rectum   Social History   Occupational History   Not on file  Tobacco Use   Smoking status: Former    Packs/day: 0.50    Years: 20.00    Additional pack years: 0.00    Total pack years: 10.00    Types: Cigarettes    Quit date: 05/05/2017    Years since quitting: 5.0   Smokeless tobacco: Never   Tobacco comments:    patient states she quit one week ago  Vaping Use   Vaping Use: Never used  Substance and Sexual Activity   Alcohol use: No   Drug use: No   Sexual activity: Not on file

## 2022-05-15 ENCOUNTER — Ambulatory Visit (INDEPENDENT_AMBULATORY_CARE_PROVIDER_SITE_OTHER): Payer: Medicare Other

## 2022-05-15 DIAGNOSIS — L97509 Non-pressure chronic ulcer of other part of unspecified foot with unspecified severity: Secondary | ICD-10-CM

## 2022-05-15 DIAGNOSIS — L89896 Pressure-induced deep tissue damage of other site: Secondary | ICD-10-CM

## 2022-05-15 LAB — VAS US ABI WITH/WO TBI
Left ABI: 0.94
Right ABI: 1.08

## 2022-05-17 ENCOUNTER — Telehealth: Payer: Self-pay

## 2022-05-17 NOTE — Telephone Encounter (Signed)
Cassidy Smith with [facility] called stating that they are having some difficulties with the patient and her caretakers not being compliant about her right ankle.  Stated that patient stays on the 2nd floor of her brothers house, has dementia, and spends a whole day fully dressed in a recliner.  Would like a call back as to what needs to be done?  CB# [phone].  Please advise.  Thank you

## 2022-06-04 ENCOUNTER — Other Ambulatory Visit (INDEPENDENT_AMBULATORY_CARE_PROVIDER_SITE_OTHER): Payer: Medicare Other

## 2022-06-04 ENCOUNTER — Telehealth: Payer: Self-pay | Admitting: Orthopedic Surgery

## 2022-06-04 ENCOUNTER — Ambulatory Visit (INDEPENDENT_AMBULATORY_CARE_PROVIDER_SITE_OTHER): Payer: Medicare Other | Admitting: Orthopedic Surgery

## 2022-06-04 ENCOUNTER — Encounter: Payer: Self-pay | Admitting: Orthopedic Surgery

## 2022-06-04 DIAGNOSIS — S91001D Unspecified open wound, right ankle, subsequent encounter: Secondary | ICD-10-CM

## 2022-06-04 DIAGNOSIS — S82891G Other fracture of right lower leg, subsequent encounter for closed fracture with delayed healing: Secondary | ICD-10-CM | POA: Diagnosis not present

## 2022-06-04 DIAGNOSIS — L89896 Pressure-induced deep tissue damage of other site: Secondary | ICD-10-CM

## 2022-06-04 NOTE — Telephone Encounter (Signed)
[facility] asking to speak to Autumn-(Phillip Jones- [phone]), need weight bearing status..please advise

## 2022-06-04 NOTE — Progress Notes (Signed)
Office Visit Note   Patient: Cassidy Smith           Date of Birth: 05-30-1955           MRN: [id number] Visit Date: 06/04/2022              Requested by: Toma Deiters, MD [address] [place],  [place] [address] PCP: Toma Deiters, MD  Chief Complaint  Patient presents with   Right Ankle - Fracture, Follow-up      HPI: Patient is a 67 year old woman who presents in follow-up for closed right ankle fracture.  Patient was initially splinted developed significant ischemic ulcers over the lateral and dorsum of the right ankle.  Surgical intervention was not an option due to the soft tissue injury and patient has been immobilized in a cam boot nonweightbearing.  Assessment & Plan: Visit Diagnoses:  1. Closed fracture of right ankle with delayed healing, subsequent encounter   2. Pressure injury of deep tissue of dorsum of right foot   3. Wound of right ankle, subsequent encounter     Plan: Will continue immobilization and wound care dressing changes.  Discussed that I am still concerned about the ischemic ulcer over the dorsum of the foot and ankle.  Repeat three-view radiographs of the right ankle at follow-up.  Follow-Up Instructions: Return in about 3 weeks (around 06/25/2022).   Ortho Exam  Patient is alert, oriented, no adenopathy, well-dressed, normal affect, normal respiratory effort. Examination of the ischemic ulcers over the fibula are healing well and superficial.  No cellulitis no signs of infection.  Lamination the ulcer over the dorsum of the foot and ankle shows a large thick 5 x 4 cm eschar.  There is healthy granulation tissue around the edges but concern for involvement with the tendons.  There is no drainage no signs of infection.  Imaging: XR Ankle Complete Right  Result Date: 06/04/2022 Three-view radiographs of the right ankle shows exuberant callus formation around the tibia and fibular fracture.  No images are attached to the  encounter.  Labs: Lab Results  Component Value Date   HGBA1C 5.0 06/10/2017   HGBA1C 5.4 05/13/2013   ESRSEDRATE 2 05/13/2013   LABURIC 2.4 04/11/2017   REPTSTATUS 02/20/2022 FINAL 02/20/2022   REPTSTATUS 02/25/2022 FINAL 02/20/2022   GRAMSTAIN  02/20/2022    NO ORGANISMS SEEN WBC PRESENT, PREDOMINANTLY MONONUCLEAR CYTOSPIN SMEAR Performed at [facility], [address], [place], [place] [address]    CULT  02/20/2022    NO GROWTH 5 DAYS Performed at [facility], [address] [place], [place] [address]    [facility] ESCHERICHIA COLI (A) 07/25/2020     Lab Results  Component Value Date   ALBUMIN 2.0 (L) 02/21/2022   ALBUMIN 3.2 (L) 08/12/2020   ALBUMIN 3.3 (L) 07/25/2020    Lab Results  Component Value Date   MG 1.9 02/24/2022   MG 1.5 (L) 02/23/2022   MG 1.6 (L) 02/22/2022   Lab Results  Component Value Date   VD25OH 49.2 03/06/2017    No results found for: "PREALBUMIN"    Latest Ref Rng & Units 04/11/2022   12:56 PM 02/24/2022    4:52 AM 02/23/2022    6:11 AM  CBC EXTENDED  WBC 4.0 - 10.5 K/uL 10.8  6.5  6.5   RBC 3.87 - 5.11 MIL/uL 3.93  2.53  2.60   Hemoglobin 12.0 - 15.0 g/dL 21.3  8.6  8.4   HCT 08.6 - 46.0 %  36.7  25.2  26.0   Platelets 150 - 400 K/uL 228  168  111   NEUT# 1.7 - 7.7 K/uL 5.7     Lymph# 0.7 - 4.0 K/uL 3.6        There is no height or weight on file to calculate BMI.  Orders:  Orders Placed This Encounter  Procedures   XR Ankle Complete Right   No orders of the defined types were placed in this encounter.    Procedures: No procedures performed  Clinical Data: No additional findings.  ROS:  All other systems negative, except as noted in the HPI. Review of Systems  Objective: Vital Signs: There were no vitals taken for this visit.  Specialty Comments:  No specialty comments available.  PMFS History: Patient Active Problem List   Diagnosis Date Noted   Acute heart failure with preserved ejection fraction (HFpEF)  (HCC) 02/20/2022   Acute respiratory failure with hypoxia (HCC) 02/20/2022   AKI (acute kidney injury) (HCC) 02/15/2022   Aspiration pneumonia (HCC) 02/15/2022   Leukocytosis 02/15/2022   Falls frequently 02/15/2022   SIRS (systemic inflammatory response syndrome) (HCC) 08/12/2020   Mild protein malnutrition (HCC) 08/12/2020   Sepsis (HCC) 07/25/2020   Sepsis with acute hypoxic respiratory failure without septic shock (HCC)    Dementia without behavioral disturbance (HCC)    Palliative care by specialist    Goals of care, counseling/discussion    Pneumonia of right lower lobe due to infectious organism 11/06/2019   Intracerebral hematoma (HCC) 07/04/2019   Closed head injury 07/03/2019   Thrombocytopenia (HCC) 07/03/2019   Thyrotoxicosis, unspecified with thyrotoxic crisis or storm 01/15/2019   History of syndrome of inappropriate antidiuretic hormone (SIADH) 01/15/2019   UTI (urinary tract infection) 01/04/2018   Neuromuscular disorder (HCC)    Dysuria 10/30/2017   Fever 08/15/2017   Hypokalemia 08/15/2017   Altered mental state 08/15/2017   Acute metabolic encephalopathy 08/15/2017   Valproic acid toxicity 08/15/2017   Rectal cancer (HCC) 07/10/2017   Chronic deep vein thrombosis (DVT) of distal vein of lower extremity (HCC) 07/10/2017   Altered mental status    Ischemic stroke (HCC)    GERD (gastroesophageal reflux disease) 05/12/2017   GI bleed    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   Dysplastic rectal polyp    Protein-calorie malnutrition, severe 04/07/2017   Pressure injury of skin 04/04/2017   Anticoagulated 04/03/2017   Stool bloody 04/03/2017   Current every day smoker 04/03/2017   Rectal bleeding    Chronic diarrhea    Hyperthyroidism    S/P ORIF (open reduction internal fixation) fracture right hip IM nail 03/07/17 03/24/2017   Hypomagnesemia 03/07/2017   Fall    Closed intertrochanteric fracture of hip, right, initial encounter  (HCC) 03/06/2017   Radial styloid fracture: right 03/06/2017   Normocytic anemia 03/06/2017   Orthostatic hypotension 06/07/2015   Lower urinary tract infectious disease 06/07/2015   Rhabdomyolysis 06/07/2015   White matter abnormality on MRI of brain 05/14/2013   History of depression 05/14/2013   Hx of anxiety disorder 05/14/2013   Bipolar I disorder, most recent episode (or current) manic (HCC) 05/14/2013   Bipolar 1 disorder (HCC) 05/14/2013   Hyponatremia 05/12/2013   Lacunar infarct, acute (HCC) 05/12/2013   Past Medical History:  Diagnosis Date   Anxiety    Bipolar 1 disorder (HCC)    Chronic deep vein thrombosis (DVT) of distal vein of lower extremity (HCC) 07/10/2017   Chronic hip pain  Depression    GI bleed    Headache(784.0)    History of UTI/bladder spams.    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   Thyrotoxicosis, unspecified with thyrotoxic crisis or storm 01/15/2019    Family History  Problem Relation Age of Onset   Breast cancer Mother    Cancer - Other Mother    Alcoholism Father    Colon cancer Neg Hx    Colon polyps Neg Hx     Past Surgical History:  Procedure Laterality Date   COLONOSCOPY WITH PROPOFOL N/A 04/05/2017   Procedure: COLONOSCOPY WITH PROPOFOL;  Surgeon: Malissa Hippo, MD;  [facility];  Service: Endoscopy;  Laterality: N/A;   INTRAMEDULLARY (IM) NAIL INTERTROCHANTERIC Right 03/07/2017   Procedure: OPEN TREATMENT INTERNAL FIXATION RIGHT HIP WITH GAMA INTRAMEDULARY NAIL;  Surgeon: Vickki Hearing, MD;  Location: [facility];  Service: Orthopedics;  Laterality: Right;   MULTIPLE EXTRACTIONS WITH  ALVEOLOPLASTY N/A 10/30/2012   Procedure: MULTIPLE EXTRACION 5, 6, 8, 9, 10 ,18, 19, 31 WITH MAXILLARY RIGHT AND LEFT  ALVEOLOPLASTY REDUCE MAXILLARY LEFT TUBEROSITY;  Surgeon: Georgia Lopes, DDS;  Location: [facility];  Service: Oral Surgery;  Laterality: N/A;   POLYPECTOMY  04/05/2017   Procedure: POLYPECTOMY;  Surgeon: Malissa Hippo, MD;  [facility];  Service: Endoscopy;;  recto-sigmoid, rectum   Social History   Occupational History   Not on file  Tobacco Use   Smoking status: Former    Packs/day: 0.50    Years: 20.00    Additional pack years: 0.00    Total pack years: 10.00    Types: Cigarettes    Quit date: 05/05/2017    Years since quitting: 5.0   Smokeless tobacco: Never   Tobacco comments:    patient states she quit one week ago  Vaping Use   Vaping Use: Never used  Substance and Sexual Activity   Alcohol use: No   Drug use: No   Sexual activity: Not on file

## 2022-06-06 NOTE — Telephone Encounter (Signed)
Cassidy Smith informed that pt is to be non weight bearing in cam walker.

## 2022-06-25 ENCOUNTER — Other Ambulatory Visit (INDEPENDENT_AMBULATORY_CARE_PROVIDER_SITE_OTHER): Payer: Medicare Other

## 2022-06-25 ENCOUNTER — Ambulatory Visit (INDEPENDENT_AMBULATORY_CARE_PROVIDER_SITE_OTHER): Payer: Medicare Other | Admitting: Orthopedic Surgery

## 2022-06-25 DIAGNOSIS — S82891G Other fracture of right lower leg, subsequent encounter for closed fracture with delayed healing: Secondary | ICD-10-CM | POA: Diagnosis not present

## 2022-07-04 ENCOUNTER — Encounter: Payer: Self-pay | Admitting: Orthopedic Surgery

## 2022-07-04 NOTE — Progress Notes (Signed)
Office Visit Note   Patient: Cassidy Smith           Date of Birth: 1955/11/23           MRN: [id number] Visit Date: 06/25/2022              Requested by: Toma Deiters, MD [address] [place],  [place] [address] PCP: Toma Deiters, MD  Chief Complaint  Patient presents with   Right Ankle - Fracture, Follow-up      HPI: Patient is a 67 year old woman who is seen in follow-up for complex ankle fracture with soft tissue injury.  Patient has been proceeding with Dial soap cleansing dressing changes and a fracture boot.  Assessment & Plan: Visit Diagnoses:  1. Closed fracture of right ankle with delayed healing, subsequent encounter     Plan: The fracture clinically appears stable she will advance her activities as tolerated.  The eschar is stable will reevaluate in 4 weeks.  May eventually need wound debridement.  Follow-Up Instructions: No follow-ups on file.   Ortho Exam  Patient is alert, oriented, no adenopathy, well-dressed, normal affect, normal respiratory effort. Examination clinically the fracture is stable radiograph shows hypertrophic callus.  She does have persistent eschar ulcer over the dorsum of the ankle that is 4 x 4 cm.  There is superficial epithelialization around the wound edges.  There is no drainage no cellulitis no exposed tendons.  The ulcers over the lateral foot are also improved.  Imaging: No results found. No images are attached to the encounter.  Labs: Lab Results  Component Value Date   HGBA1C 5.0 06/10/2017   HGBA1C 5.4 05/13/2013   ESRSEDRATE 2 05/13/2013   LABURIC 2.4 04/11/2017   REPTSTATUS 02/20/2022 FINAL 02/20/2022   REPTSTATUS 02/25/2022 FINAL 02/20/2022   GRAMSTAIN  02/20/2022    NO ORGANISMS SEEN WBC PRESENT, PREDOMINANTLY MONONUCLEAR CYTOSPIN SMEAR Performed at [facility], [address] [place], [place] [address]    CULT  02/20/2022    NO GROWTH 5 DAYS Performed at [facility], [address], [place],  [place] [address]    [facility] ESCHERICHIA COLI (A) 07/25/2020     Lab Results  Component Value Date   ALBUMIN 2.0 (L) 02/21/2022   ALBUMIN 3.2 (L) 08/12/2020   ALBUMIN 3.3 (L) 07/25/2020    Lab Results  Component Value Date   MG 1.9 02/24/2022   MG 1.5 (L) 02/23/2022   MG 1.6 (L) 02/22/2022   Lab Results  Component Value Date   VD25OH 49.2 03/06/2017    No results found for: "PREALBUMIN"    Latest Ref Rng & Units 04/11/2022   12:56 PM 02/24/2022    4:52 AM 02/23/2022    6:11 AM  CBC EXTENDED  WBC 4.0 - 10.5 K/uL 10.8  6.5  6.5   RBC 3.87 - 5.11 MIL/uL 3.93  2.53  2.60   Hemoglobin 12.0 - 15.0 g/dL 21.3  8.6  8.4   HCT 08.6 - 46.0 % 36.7  25.2  26.0   Platelets 150 - 400 K/uL 228  168  111   NEUT# 1.7 - 7.7 K/uL 5.7     Lymph# 0.7 - 4.0 K/uL 3.6        There is no height or weight on file to calculate BMI.  Orders:  Orders Placed This Encounter  Procedures   XR Ankle Complete Right   No orders of the defined types were placed in this encounter.    Procedures:  No procedures performed  Clinical Data: No additional findings.  ROS:  All other systems negative, except as noted in the HPI. Review of Systems  Objective: Vital Signs: There were no vitals taken for this visit.  Specialty Comments:  No specialty comments available.  PMFS History: Patient Active Problem List   Diagnosis Date Noted   Acute heart failure with preserved ejection fraction (HFpEF) (HCC) 02/20/2022   Acute respiratory failure with hypoxia (HCC) 02/20/2022   AKI (acute kidney injury) (HCC) 02/15/2022   Aspiration pneumonia (HCC) 02/15/2022   Leukocytosis 02/15/2022   Falls frequently 02/15/2022   SIRS (systemic inflammatory response syndrome) (HCC) 08/12/2020   Mild protein malnutrition (HCC) 08/12/2020   Sepsis (HCC) 07/25/2020   Sepsis with acute hypoxic respiratory failure without septic shock (HCC)    Dementia without behavioral disturbance (HCC)    Palliative care by specialist     Goals of care, counseling/discussion    Pneumonia of right lower lobe due to infectious organism 11/06/2019   Intracerebral hematoma (HCC) 07/04/2019   Closed head injury 07/03/2019   Thrombocytopenia (HCC) 07/03/2019   Thyrotoxicosis, unspecified with thyrotoxic crisis or storm 01/15/2019   History of syndrome of inappropriate antidiuretic hormone (SIADH) 01/15/2019   UTI (urinary tract infection) 01/04/2018   Neuromuscular disorder (HCC)    Dysuria 10/30/2017   Fever 08/15/2017   Hypokalemia 08/15/2017   Altered mental state 08/15/2017   Acute metabolic encephalopathy 08/15/2017   Valproic acid toxicity 08/15/2017   Rectal cancer (HCC) 07/10/2017   Chronic deep vein thrombosis (DVT) of distal vein of lower extremity (HCC) 07/10/2017   Altered mental status    Ischemic stroke (HCC)    GERD (gastroesophageal reflux disease) 05/12/2017   GI bleed    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   Dysplastic rectal polyp    Protein-calorie malnutrition, severe 04/07/2017   Pressure injury of skin 04/04/2017   Anticoagulated 04/03/2017   Stool bloody 04/03/2017   Current every day smoker 04/03/2017   Rectal bleeding    Chronic diarrhea    Hyperthyroidism    S/P ORIF (open reduction internal fixation) fracture right hip IM nail 03/07/17 03/24/2017   Hypomagnesemia 03/07/2017   Fall    Closed intertrochanteric fracture of hip, right, initial encounter (HCC) 03/06/2017   Radial styloid fracture: right 03/06/2017   Normocytic anemia 03/06/2017   Orthostatic hypotension 06/07/2015   Lower urinary tract infectious disease 06/07/2015   Rhabdomyolysis 06/07/2015   White matter abnormality on MRI of brain 05/14/2013   History of depression 05/14/2013   Hx of anxiety disorder 05/14/2013   Bipolar I disorder, most recent episode (or current) manic (HCC) 05/14/2013   Bipolar 1 disorder (HCC) 05/14/2013   Hyponatremia 05/12/2013   Lacunar infarct, acute (HCC)  05/12/2013   Past Medical History:  Diagnosis Date   Anxiety    Bipolar 1 disorder (HCC)    Chronic deep vein thrombosis (DVT) of distal vein of lower extremity (HCC) 07/10/2017   Chronic hip pain    Depression    GI bleed    Headache(784.0)    History of UTI/bladder spams.    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  Thyrotoxicosis, unspecified with thyrotoxic crisis or storm 01/15/2019    Family History  Problem Relation Age of Onset   Breast cancer Mother    Cancer - Other Mother    Alcoholism Father    Colon cancer Neg Hx    Colon polyps Neg Hx     Past Surgical History:  Procedure Laterality Date   COLONOSCOPY WITH PROPOFOL N/A 04/05/2017   Procedure: COLONOSCOPY WITH PROPOFOL;  Surgeon: Malissa Hippo, MD;  [facility];  Service: Endoscopy;  Laterality: N/A;   INTRAMEDULLARY (IM) NAIL INTERTROCHANTERIC Right 03/07/2017   Procedure: OPEN TREATMENT INTERNAL FIXATION RIGHT HIP WITH GAMA INTRAMEDULARY NAIL;  Surgeon: Vickki Hearing, MD;  Location: [facility];  Service: Orthopedics;  Laterality: Right;   MULTIPLE EXTRACTIONS WITH ALVEOLOPLASTY N/A 10/30/2012   Procedure: MULTIPLE EXTRACION 5, 6, 8, 9, 10 ,18, 19, 31 WITH MAXILLARY RIGHT AND LEFT  ALVEOLOPLASTY REDUCE MAXILLARY LEFT TUBEROSITY;  Surgeon: Georgia Lopes, DDS;  Location: [facility];  Service: Oral Surgery;  Laterality: N/A;   POLYPECTOMY  04/05/2017   Procedure: POLYPECTOMY;  Surgeon: Malissa Hippo, MD;  [facility];  Service: Endoscopy;;  recto-sigmoid, rectum   Social History   Occupational History   Not on file  Tobacco Use   Smoking status: Former     Packs/day: 0.50    Years: 20.00    Additional pack years: 0.00    Total pack years: 10.00    Types: Cigarettes    Quit date: 05/05/2017    Years since quitting: 5.1   Smokeless tobacco: Never   Tobacco comments:    patient states she quit one week ago  Vaping Use   Vaping Use: Never used  Substance and Sexual Activity   Alcohol use: No   Drug use: No   Sexual activity: Not on file

## 2022-07-25 ENCOUNTER — Ambulatory Visit (INDEPENDENT_AMBULATORY_CARE_PROVIDER_SITE_OTHER): Payer: Medicare Other | Admitting: Orthopedic Surgery

## 2022-07-25 DIAGNOSIS — L89896 Pressure-induced deep tissue damage of other site: Secondary | ICD-10-CM | POA: Diagnosis not present

## 2022-07-25 DIAGNOSIS — S91001D Unspecified open wound, right ankle, subsequent encounter: Secondary | ICD-10-CM | POA: Diagnosis not present

## 2022-07-30 ENCOUNTER — Encounter (HOSPITAL_COMMUNITY): Payer: Self-pay | Admitting: Orthopedic Surgery

## 2022-07-30 ENCOUNTER — Other Ambulatory Visit: Payer: Self-pay

## 2022-07-30 NOTE — Anesthesia Preprocedure Evaluation (Signed)
Anesthesia Evaluation  Patient identified by MRN, date of birth, ID band Patient awake    Reviewed: Allergy & Precautions, NPO status , Patient's Chart, lab work & pertinent test results  Airway Mallampati: I  TM Distance: >3 FB Neck ROM: Full    Dental  (+) Edentulous Upper, Edentulous Lower   Pulmonary pneumonia, resolved, Patient abstained from smoking., former smoker   breath sounds clear to auscultation + decreased breath sounds      Cardiovascular negative cardio ROS  Rhythm:Regular Rate:Normal  On Xarelto x 1 month.   Neuro/Psych  Headaches PSYCHIATRIC DISORDERS Anxiety Depression Bipolar Disorder  Dementia Hx/o psychosis Neuromuscular disease CVA, No Residual Symptoms    GI/Hepatic Neg liver ROS,GERD  Medicated,,  Endo/Other   Hyperthyroidism   Renal/GU Renal disease  negative genitourinary   Musculoskeletal  (+) Arthritis , Osteoarthritis,  Necrotic wound right foot   Abdominal  (+)  Abdomen: soft. Bowel sounds: normal.  Peds  Hematology  (+) Blood dyscrasia, anemia   Anesthesia Other Findings   Reproductive/Obstetrics                             Anesthesia Physical Anesthesia Plan  ASA: 3  Anesthesia Plan: MAC   Post-op Pain Management:    Induction: Intravenous  PONV Risk Score and Plan: 4 or greater and Treatment may vary due to age or medical condition and Ondansetron  Airway Management Planned: Natural Airway and Nasal Cannula  Additional Equipment: None  Intra-op Plan:   Post-operative Plan:   Informed Consent: I have reviewed the patients History and Physical, chart, labs and discussed the procedure including the risks, benefits and alternatives for the proposed anesthesia with the patient or authorized representative who has indicated his/her understanding and acceptance.     Dental advisory given  Plan Discussed with: Surgeon and  Anesthesiologist  Anesthesia Plan Comments:         Anesthesia Quick Evaluation

## 2022-07-30 NOTE — Progress Notes (Addendum)
Cassidy Smith 's home phone's voice mail is full. I called patient's cell phone, which is her brother Cassidy Smith phone. Cassidy Smith reports that Cassidy Driskill lives with another brother, Cassidy Smith, I asked if I could have his number, Cassidy Smith said he never answer's the phone.  Cassidy Smith said he would go to their house (2 miles away) and call me.  Cassidy Smith called and gave the phone to [facility].  Cassidy Smith identified herself by name and date of breath. Cassidy Smith  denied chest pain or shortness of breath. Cassidy Smith and Cassidy Smith denied any signs or symptoms of Covid in the home. Cassidy Smith and Cassidy Smith report that Cassidy Smith has not had not had any s/s  any s/s of upper or lower respiratory infection the past 8 weeks.   Cassidy Smith was living alone in Feb 2 of 2024. Patient fell she fractured, she was hospitalized until 02/24/22. While hospitalized Cassidy Smith had acute respiratory failure with hypoxia, patient with pulmonary edema.  Cassidy Smith had a thoracentesis - 800 ml of fluid was removed. Patient also went into ARF- which was resolved by discharge date. Patient was discharged on oxygen to keep sats between 88- 92.  Patient continues on oxygen at home if needed.  Cassidy Smith has 2 Pallative Care Assistance that are with her 4 hours each a day.  Cassidy Smith reports that the afternoon, evening assistant using puts oxygen on patient, that is all he knows. Cassidy Smith that patient lives with was sleeping.  Cassidy Smith for the most part answered questions appropriately, a couple of times she added number to the answers.

## 2022-07-31 ENCOUNTER — Ambulatory Visit (HOSPITAL_COMMUNITY): Payer: Medicare Other | Admitting: Physician Assistant

## 2022-07-31 ENCOUNTER — Encounter (HOSPITAL_COMMUNITY)
Admission: RE | Disposition: A | Discharge status: Home Health | Payer: Self-pay | Source: Home / Self Care | Attending: Orthopedic Surgery

## 2022-07-31 ENCOUNTER — Inpatient Hospital Stay (HOSPITAL_COMMUNITY)
Admission: RE | Admit: 2022-07-31 | Discharge: 2022-08-05 | DRG: 574 | Disposition: A | Discharge status: Home Health | Payer: Medicare Other | Attending: Orthopedic Surgery | Admitting: Orthopedic Surgery

## 2022-07-31 ENCOUNTER — Other Ambulatory Visit: Payer: Self-pay

## 2022-07-31 DIAGNOSIS — L97414 Non-pressure chronic ulcer of right heel and midfoot with necrosis of bone: Secondary | ICD-10-CM | POA: Diagnosis not present

## 2022-07-31 DIAGNOSIS — Z87891 Personal history of nicotine dependence: Secondary | ICD-10-CM

## 2022-07-31 DIAGNOSIS — M1990 Unspecified osteoarthritis, unspecified site: Secondary | ICD-10-CM | POA: Diagnosis present

## 2022-07-31 DIAGNOSIS — F419 Anxiety disorder, unspecified: Secondary | ICD-10-CM | POA: Diagnosis present

## 2022-07-31 DIAGNOSIS — S82891G Other fracture of right lower leg, subsequent encounter for closed fracture with delayed healing: Secondary | ICD-10-CM

## 2022-07-31 DIAGNOSIS — I679 Cerebrovascular disease, unspecified: Secondary | ICD-10-CM | POA: Diagnosis not present

## 2022-07-31 DIAGNOSIS — I5031 Acute diastolic (congestive) heart failure: Secondary | ICD-10-CM | POA: Diagnosis not present

## 2022-07-31 DIAGNOSIS — E876 Hypokalemia: Secondary | ICD-10-CM | POA: Diagnosis not present

## 2022-07-31 DIAGNOSIS — K5900 Constipation, unspecified: Secondary | ICD-10-CM | POA: Diagnosis present

## 2022-07-31 DIAGNOSIS — I5030 Unspecified diastolic (congestive) heart failure: Secondary | ICD-10-CM | POA: Diagnosis not present

## 2022-07-31 DIAGNOSIS — F319 Bipolar disorder, unspecified: Secondary | ICD-10-CM | POA: Diagnosis present

## 2022-07-31 DIAGNOSIS — Z811 Family history of alcohol abuse and dependence: Secondary | ICD-10-CM

## 2022-07-31 DIAGNOSIS — Z6372 Alcoholism and drug addiction in family: Secondary | ICD-10-CM | POA: Diagnosis not present

## 2022-07-31 DIAGNOSIS — Z86718 Personal history of other venous thrombosis and embolism: Secondary | ICD-10-CM | POA: Diagnosis not present

## 2022-07-31 DIAGNOSIS — Z8673 Personal history of transient ischemic attack (TIA), and cerebral infarction without residual deficits: Secondary | ICD-10-CM

## 2022-07-31 DIAGNOSIS — G8929 Other chronic pain: Secondary | ICD-10-CM | POA: Diagnosis present

## 2022-07-31 DIAGNOSIS — S92301D Fracture of unspecified metatarsal bone(s), right foot, subsequent encounter for fracture with routine healing: Secondary | ICD-10-CM | POA: Diagnosis not present

## 2022-07-31 DIAGNOSIS — Z803 Family history of malignant neoplasm of breast: Secondary | ICD-10-CM | POA: Diagnosis not present

## 2022-07-31 DIAGNOSIS — J9601 Acute respiratory failure with hypoxia: Principal | ICD-10-CM

## 2022-07-31 DIAGNOSIS — Z87440 Personal history of urinary (tract) infections: Secondary | ICD-10-CM

## 2022-07-31 DIAGNOSIS — L02611 Cutaneous abscess of right foot: Secondary | ICD-10-CM | POA: Diagnosis not present

## 2022-07-31 DIAGNOSIS — S91301D Unspecified open wound, right foot, subsequent encounter: Secondary | ICD-10-CM | POA: Diagnosis not present

## 2022-07-31 HISTORY — PX: I & D EXTREMITY: SHX5045

## 2022-07-31 HISTORY — DX: Unspecified osteoarthritis, unspecified site: M19.90

## 2022-07-31 HISTORY — DX: Unspecified psychosis not due to a substance or known physiological condition: F29

## 2022-07-31 LAB — CBC
HCT: 38.1 % (ref 36.0–46.0)
Hemoglobin: 11.6 g/dL — ABNORMAL LOW (ref 12.0–15.0)
MCH: 28.4 pg (ref 26.0–34.0)
MCHC: 30.4 g/dL (ref 30.0–36.0)
MCV: 93.4 fL (ref 80.0–100.0)
Platelets: 160 10*3/uL (ref 150–400)
RBC: 4.08 MIL/uL (ref 3.87–5.11)
RDW: 14.8 % (ref 11.5–15.5)
WBC: 9.3 10*3/uL (ref 4.0–10.5)
nRBC: 0 % (ref 0.0–0.2)

## 2022-07-31 LAB — BASIC METABOLIC PANEL
Anion gap: 13 (ref 5–15)
BUN: 20 mg/dL (ref 8–23)
CO2: 33 mmol/L — ABNORMAL HIGH (ref 22–32)
Calcium: 9.9 mg/dL (ref 8.9–10.3)
Chloride: 93 mmol/L — ABNORMAL LOW (ref 98–111)
Creatinine, Ser: 0.63 mg/dL (ref 0.44–1.00)
GFR, Estimated: >60 mL/min (ref >60)
Glucose, Bld: 81 mg/dL (ref 70–99)
Potassium: 4.3 mmol/L (ref 3.5–5.1)
Sodium: 139 mmol/L (ref 135–145)

## 2022-07-31 SURGERY — IRRIGATION AND DEBRIDEMENT EXTREMITY
Anesthesia: General | Site: Foot | Laterality: Right

## 2022-07-31 MED ORDER — ATORVASTATIN CALCIUM 10 MG PO TABS
20.0000 mg | ORAL_TABLET | Freq: Every day | ORAL | Status: DC
  Administered 2022-07-31 – 2022-08-04 (×5): 20 mg via ORAL
  Filled 2022-07-31 (×5): qty 2

## 2022-07-31 MED ORDER — LACTATED RINGERS IV SOLN: INTRAVENOUS | Status: DC

## 2022-07-31 MED ORDER — VITAMIN C 500 MG PO TABS
1000.0000 mg | ORAL_TABLET | Freq: Every day | ORAL | Status: DC
  Administered 2022-07-31 – 2022-08-05 (×6): 1000 mg via ORAL
  Filled 2022-07-31 (×6): qty 2

## 2022-07-31 MED ORDER — METRONIDAZOLE 500 MG PO TABS
500.0000 mg | ORAL_TABLET | Freq: Three times a day (TID) | ORAL | Status: DC
  Administered 2022-07-31 – 2022-08-05 (×15): 500 mg via ORAL
  Filled 2022-07-31 (×16): qty 1

## 2022-07-31 MED ORDER — BENZTROPINE MESYLATE 1 MG PO TABS
1.0000 mg | ORAL_TABLET | Freq: Two times a day (BID) | ORAL | Status: DC
  Administered 2022-07-31 – 2022-08-05 (×10): 1 mg via ORAL
  Filled 2022-07-31 (×11): qty 1

## 2022-07-31 MED ORDER — POLYETHYLENE GLYCOL 3350 17 G PO PACK: 17.0000 g | PACK | Freq: Every day | ORAL | Status: DC | PRN

## 2022-07-31 MED ORDER — OXYCODONE HCL 5 MG PO TABS: 5.0000 mg | ORAL_TABLET | ORAL | Status: DC | PRN

## 2022-07-31 MED ORDER — SODIUM CHLORIDE 0.9 % IV SOLN
2.0000 g | INTRAVENOUS | Status: DC
  Administered 2022-07-31 – 2022-08-03 (×4): 2 g via INTRAVENOUS
  Filled 2022-07-31 (×4): qty 20

## 2022-07-31 MED ORDER — POTASSIUM CHLORIDE CRYS ER 20 MEQ PO TBCR: 20.0000 meq | EXTENDED_RELEASE_TABLET | Freq: Every day | ORAL | Status: DC | PRN

## 2022-07-31 MED ORDER — GABAPENTIN 100 MG PO CAPS: 100.0000 mg | ORAL_CAPSULE | Freq: Three times a day (TID) | ORAL | Status: DC

## 2022-07-31 MED ORDER — METOPROLOL TARTRATE 5 MG/5ML IV SOLN: 2.0000 mg | INTRAVENOUS | Status: DC | PRN

## 2022-07-31 MED ORDER — LIDOCAINE HCL (PF) 1 % IJ SOLN
INTRAMUSCULAR | Status: AC
  Filled 2022-07-31: qty 30

## 2022-07-31 MED ORDER — JUVEN PO PACK
1.0000 | PACK | Freq: Two times a day (BID) | ORAL | Status: DC
  Administered 2022-07-31 – 2022-08-04 (×8): 1 via ORAL
  Filled 2022-07-31 (×7): qty 1

## 2022-07-31 MED ORDER — DIVALPROEX SODIUM ER 500 MG PO TB24
1000.0000 mg | ORAL_TABLET | Freq: Every day | ORAL | Status: DC
  Administered 2022-07-31 – 2022-08-04 (×5): 1000 mg via ORAL
  Filled 2022-07-31 (×6): qty 2

## 2022-07-31 MED ORDER — ONDANSETRON HCL 4 MG/2ML IJ SOLN
INTRAMUSCULAR | Status: AC
  Filled 2022-07-31: qty 2

## 2022-07-31 MED ORDER — DEXAMETHASONE SODIUM PHOSPHATE 10 MG/ML IJ SOLN
INTRAMUSCULAR | Status: AC
  Filled 2022-07-31: qty 1

## 2022-07-31 MED ORDER — GABAPENTIN 100 MG PO CAPS
200.0000 mg | ORAL_CAPSULE | Freq: Every day | ORAL | Status: DC
  Administered 2022-07-31 – 2022-08-04 (×5): 200 mg via ORAL
  Filled 2022-07-31 (×5): qty 2

## 2022-07-31 MED ORDER — MAGNESIUM SULFATE 2 GM/50ML IV SOLN: 2.0000 g | Freq: Every day | INTRAVENOUS | Status: DC | PRN

## 2022-07-31 MED ORDER — PANTOPRAZOLE SODIUM 40 MG PO TBEC: 40.0000 mg | DELAYED_RELEASE_TABLET | Freq: Every day | ORAL | Status: DC

## 2022-07-31 MED ORDER — SODIUM CHLORIDE 0.9 % IV SOLN: INTRAVENOUS | Status: DC

## 2022-07-31 MED ORDER — MAGNESIUM CITRATE PO SOLN: 1.0000 | Freq: Once | ORAL | Status: DC | PRN

## 2022-07-31 MED ORDER — CHLORHEXIDINE GLUCONATE 0.12 % MT SOLN
15.0000 mL | Freq: Once | OROMUCOSAL | Status: AC
  Administered 2022-07-31: 15 mL via OROMUCOSAL
  Filled 2022-07-31: qty 15

## 2022-07-31 MED ORDER — IPRATROPIUM-ALBUTEROL 0.5-2.5 (3) MG/3ML IN SOLN: 3.0000 mL | Freq: Four times a day (QID) | RESPIRATORY_TRACT | Status: DC | PRN

## 2022-07-31 MED ORDER — CEFAZOLIN SODIUM-DEXTROSE 2-4 GM/100ML-% IV SOLN
2.0000 g | INTRAVENOUS | Status: AC
  Administered 2022-07-31: 2 g via INTRAVENOUS
  Filled 2022-07-31: qty 100

## 2022-07-31 MED ORDER — ORAL CARE MOUTH RINSE: 15.0000 mL | Freq: Once | OROMUCOSAL | Status: AC

## 2022-07-31 MED ORDER — ONDANSETRON HCL 4 MG/2ML IJ SOLN: 4.0000 mg | Freq: Four times a day (QID) | INTRAMUSCULAR | Status: DC | PRN

## 2022-07-31 MED ORDER — ONDANSETRON HCL 4 MG/2ML IJ SOLN
INTRAMUSCULAR | Status: DC | PRN
  Administered 2022-07-31: 4 mg via INTRAVENOUS

## 2022-07-31 MED ORDER — OXYCODONE HCL 5 MG/5ML PO SOLN: 5.0000 mg | Freq: Once | ORAL | Status: DC | PRN

## 2022-07-31 MED ORDER — PROPOFOL 10 MG/ML IV BOLUS
INTRAVENOUS | Status: AC
  Filled 2022-07-31: qty 20

## 2022-07-31 MED ORDER — LIDOCAINE 2% (20 MG/ML) 5 ML SYRINGE
INTRAMUSCULAR | Status: AC
  Filled 2022-07-31: qty 5

## 2022-07-31 MED ORDER — HYDRALAZINE HCL 20 MG/ML IJ SOLN: 5.0000 mg | INTRAMUSCULAR | Status: DC | PRN

## 2022-07-31 MED ORDER — ASPIRIN 81 MG PO TBEC
81.0000 mg | DELAYED_RELEASE_TABLET | Freq: Every day | ORAL | Status: DC
  Administered 2022-08-01 – 2022-08-05 (×5): 81 mg via ORAL
  Filled 2022-07-31 (×5): qty 1

## 2022-07-31 MED ORDER — FENTANYL CITRATE (PF) 250 MCG/5ML IJ SOLN
INTRAMUSCULAR | Status: AC
  Filled 2022-07-31: qty 5

## 2022-07-31 MED ORDER — TRAZODONE HCL 50 MG PO TABS
100.0000 mg | ORAL_TABLET | Freq: Every day | ORAL | Status: DC
  Administered 2022-07-31 – 2022-08-04 (×5): 100 mg via ORAL
  Filled 2022-07-31 (×2): qty 1
  Filled 2022-07-31: qty 2
  Filled 2022-07-31 (×2): qty 1
  Filled 2022-07-31: qty 2

## 2022-07-31 MED ORDER — OXYCODONE HCL 5 MG PO TABS: 5.0000 mg | ORAL_TABLET | Freq: Once | ORAL | Status: DC | PRN

## 2022-07-31 MED ORDER — ZINC SULFATE 220 (50 ZN) MG PO CAPS
220.0000 mg | ORAL_CAPSULE | Freq: Every day | ORAL | Status: DC
  Administered 2022-07-31 – 2022-08-05 (×6): 220 mg via ORAL
  Filled 2022-07-31 (×6): qty 1

## 2022-07-31 MED ORDER — 0.9 % SODIUM CHLORIDE (POUR BTL) OPTIME
TOPICAL | Status: DC | PRN
  Administered 2022-07-31: 1000 mL

## 2022-07-31 MED ORDER — METHIMAZOLE 10 MG PO TABS
10.0000 mg | ORAL_TABLET | ORAL | Status: DC
  Administered 2022-08-02 – 2022-08-04 (×2): 10 mg via ORAL
  Filled 2022-07-31 (×2): qty 1

## 2022-07-31 MED ORDER — VANCOMYCIN HCL 1000 MG IV SOLR
INTRAVENOUS | Status: AC
  Filled 2022-07-31: qty 20

## 2022-07-31 MED ORDER — HYDROMORPHONE HCL 1 MG/ML IJ SOLN: 0.2500 mg | INTRAMUSCULAR | Status: DC | PRN

## 2022-07-31 MED ORDER — HYDROMORPHONE HCL 1 MG/ML IJ SOLN: 0.5000 mg | INTRAMUSCULAR | Status: DC | PRN

## 2022-07-31 MED ORDER — PROPOFOL 500 MG/50ML IV EMUL
INTRAVENOUS | Status: DC | PRN
  Administered 2022-07-31: 75 ug/kg/min via INTRAVENOUS

## 2022-07-31 MED ORDER — ALUM & MAG HYDROXIDE-SIMETH 200-200-20 MG/5ML PO SUSP: 15.0000 mL | ORAL | Status: DC | PRN

## 2022-07-31 MED ORDER — DOCUSATE SODIUM 100 MG PO CAPS
100.0000 mg | ORAL_CAPSULE | Freq: Every day | ORAL | Status: DC
  Administered 2022-08-01 – 2022-08-05 (×4): 100 mg via ORAL
  Filled 2022-07-31 (×4): qty 1

## 2022-07-31 MED ORDER — GABAPENTIN 100 MG PO CAPS
100.0000 mg | ORAL_CAPSULE | Freq: Every day | ORAL | Status: DC
  Administered 2022-08-01 – 2022-08-05 (×5): 100 mg via ORAL
  Filled 2022-07-31 (×5): qty 1

## 2022-07-31 MED ORDER — LABETALOL HCL 5 MG/ML IV SOLN: 10.0000 mg | INTRAVENOUS | Status: DC | PRN

## 2022-07-31 MED ORDER — PHENOL 1.4 % MT LIQD: 1.0000 | OROMUCOSAL | Status: DC | PRN

## 2022-07-31 MED ORDER — GUAIFENESIN-DM 100-10 MG/5ML PO SYRP: 15.0000 mL | ORAL_SOLUTION | ORAL | Status: DC | PRN

## 2022-07-31 MED ORDER — PANTOPRAZOLE SODIUM 40 MG PO TBEC
40.0000 mg | DELAYED_RELEASE_TABLET | Freq: Every day | ORAL | Status: DC
  Administered 2022-07-31 – 2022-08-05 (×6): 40 mg via ORAL
  Filled 2022-07-31 (×6): qty 1

## 2022-07-31 MED ORDER — VANCOMYCIN HCL IN DEXTROSE 1-5 GM/200ML-% IV SOLN
1000.0000 mg | INTRAVENOUS | Status: DC
  Administered 2022-08-01 – 2022-08-03 (×3): 1000 mg via INTRAVENOUS
  Filled 2022-07-31 (×3): qty 200

## 2022-07-31 MED ORDER — ONDANSETRON HCL 4 MG/2ML IJ SOLN: 4.0000 mg | Freq: Once | INTRAMUSCULAR | Status: DC | PRN

## 2022-07-31 MED ORDER — BISACODYL 5 MG PO TBEC: 5.0000 mg | DELAYED_RELEASE_TABLET | Freq: Every day | ORAL | Status: DC | PRN

## 2022-07-31 MED ORDER — VANCOMYCIN HCL IN DEXTROSE 1-5 GM/200ML-% IV SOLN
1000.0000 mg | Freq: Once | INTRAVENOUS | Status: AC
  Administered 2022-07-31: 1000 mg via INTRAVENOUS
  Filled 2022-07-31: qty 200

## 2022-07-31 MED ORDER — ACETAMINOPHEN 325 MG PO TABS: 325.0000 mg | ORAL_TABLET | Freq: Four times a day (QID) | ORAL | Status: DC | PRN

## 2022-07-31 MED ORDER — OXYCODONE HCL 5 MG PO TABS: 10.0000 mg | ORAL_TABLET | ORAL | Status: DC | PRN

## 2022-07-31 MED ORDER — VANCOMYCIN HCL 1000 MG IV SOLR
INTRAVENOUS | Status: DC | PRN
  Administered 2022-07-31: 1000 mg via TOPICAL

## 2022-07-31 SURGICAL SUPPLY — 43 items
BAG COUNTER SPONGE SURGICOUNT (BAG) IMPLANT
BAG SPNG CNTER NS LX DISP (BAG)
BLADE SURG 21 STRL SS (BLADE) ×2 IMPLANT
BNDG CMPR 5X4 CHSV STRCH STRL (GAUZE/BANDAGES/DRESSINGS) ×1
BNDG CMPR 5X6 CHSV STRCH STRL (GAUZE/BANDAGES/DRESSINGS)
BNDG COHESIVE 4X5 TAN STRL LF (GAUZE/BANDAGES/DRESSINGS) IMPLANT
BNDG COHESIVE 6X5 TAN ST LF (GAUZE/BANDAGES/DRESSINGS) IMPLANT
BNDG GAUZE DERMACEA FLUFF 4 (GAUZE/BANDAGES/DRESSINGS) ×4 IMPLANT
BNDG GZE DERMACEA 4 6PLY (GAUZE/BANDAGES/DRESSINGS)
CANISTER PREVENA PLUS 150 (CANNISTER) IMPLANT
CANISTER WOUNDNEG PRESSURE 500 (CANNISTER) IMPLANT
COVER SURGICAL LIGHT HANDLE (MISCELLANEOUS) ×4 IMPLANT
DRAPE U-SHAPE 47X51 STRL (DRAPES) ×2 IMPLANT
DRESSING VERAFLO CLEANS CC MED (GAUZE/BANDAGES/DRESSINGS) IMPLANT
DRSG ADAPTIC 3X8 NADH LF (GAUZE/BANDAGES/DRESSINGS) ×2 IMPLANT
DRSG VERAFLO CLEANSE CC MED (GAUZE/BANDAGES/DRESSINGS) ×1
DURAPREP 26ML APPLICATOR (WOUND CARE) ×2 IMPLANT
ELECT REM PT RETURN 9FT ADLT (ELECTROSURGICAL) ×1
ELECTRODE REM PT RTRN 9FT ADLT (ELECTROSURGICAL) IMPLANT
GAUZE SPONGE 4X4 12PLY STRL (GAUZE/BANDAGES/DRESSINGS) ×2 IMPLANT
GLOVE BIOGEL PI IND STRL 9 (GLOVE) ×2 IMPLANT
GLOVE SURG ORTHO 9.0 STRL STRW (GLOVE) ×2 IMPLANT
GOWN STRL REUS W/ TWL XL LVL3 (GOWN DISPOSABLE) ×4 IMPLANT
GOWN STRL REUS W/TWL XL LVL3 (GOWN DISPOSABLE) ×2
GRAFT SKIN WND MICRO 38 (Tissue) IMPLANT
HANDPIECE INTERPULSE COAX TIP (DISPOSABLE)
KIT BASIN OR (CUSTOM PROCEDURE TRAY) ×2 IMPLANT
KIT TURNOVER KIT B (KITS) ×2 IMPLANT
MANIFOLD NEPTUNE II (INSTRUMENTS) ×2 IMPLANT
NDL HYPO 21X1.5 SAFETY (NEEDLE) IMPLANT
NEEDLE HYPO 21X1.5 SAFETY (NEEDLE) ×1 IMPLANT
NS IRRIG 1000ML POUR BTL (IV SOLUTION) ×2 IMPLANT
PACK ORTHO EXTREMITY (CUSTOM PROCEDURE TRAY) ×2 IMPLANT
PAD ARMBOARD 7.5X6 YLW CONV (MISCELLANEOUS) ×4 IMPLANT
SET HNDPC FAN SPRY TIP SCT (DISPOSABLE) IMPLANT
STOCKINETTE IMPERVIOUS 9X36 MD (GAUZE/BANDAGES/DRESSINGS) IMPLANT
SUT ETHILON 2 0 PSLX (SUTURE) ×2 IMPLANT
SWAB COLLECTION DEVICE MRSA (MISCELLANEOUS) ×2 IMPLANT
SWAB CULTURE ESWAB REG 1ML (MISCELLANEOUS) IMPLANT
SYR CONTROL 10ML LL (SYRINGE) IMPLANT
TOWEL GREEN STERILE (TOWEL DISPOSABLE) ×2 IMPLANT
TUBE CONNECTING 12X1/4 (SUCTIONS) ×2 IMPLANT
YANKAUER SUCT BULB TIP NO VENT (SUCTIONS) ×2 IMPLANT

## 2022-07-31 NOTE — Anesthesia Postprocedure Evaluation (Signed)
Anesthesia Post Note  Patient: Cassidy Smith  Procedure(s) Performed: RIGHT FOOT DEBRIDEMENT AND TISSUE GRAFT (Right: Foot)     Patient location during evaluation: PACU Anesthesia Type: MAC Level of consciousness: awake and alert and oriented Pain management: pain level controlled Vital Signs Assessment: post-procedure vital signs reviewed and stable Respiratory status: spontaneous breathing, nonlabored ventilation and respiratory function stable Cardiovascular status: stable and blood pressure returned to baseline Postop Assessment: no apparent nausea or vomiting Anesthetic complications: no   No notable events documented.  Last Vitals:  Vitals:   07/31/22 1045 07/31/22 1100  BP: (!) 101/53 (!) 104/59  Pulse: 69 70  Resp: (!) 23 18  Temp: 36.6 C   SpO2: 99% 97%    Last Pain:  Vitals:   07/31/22 1015  PainSc: Asleep                 Janet Humphreys A.

## 2022-07-31 NOTE — Progress Notes (Signed)
Called to patient's room due to low oxygen saturation.  Found that patient uses 2L of O2 at home and did not wear any in from home to the hospital. Place patient on 2L and warmed up her hands.  Patient finally began to register in the 90's.  Patient had some tremors which gave a false heart rate so patient connected to the monitor and EKG performed.  Patient was stable and will continue to monitor.

## 2022-07-31 NOTE — Transfer of Care (Signed)
Immediate Anesthesia Transfer of Care Note  Patient: Cassidy Smith  Procedure(s) Performed: RIGHT FOOT DEBRIDEMENT AND TISSUE GRAFT (Right)  Patient Location: PACU  Anesthesia Type:MAC  Level of Consciousness: sedated  Airway & Oxygen Therapy: Patient Spontanous Breathing and Patient connected to face mask oxygen  Post-op Assessment: Report given to RN and Post -op Vital signs reviewed and stable  Post vital signs: Reviewed and stable  Last Vitals:  Vitals Value Taken Time  BP 95/53 07/31/22 1015  Temp 36.6 C 07/31/22 1015  Pulse 67 07/31/22 1016  Resp 16 07/31/22 1015  SpO2 100 % 07/31/22 1016  Vitals shown include unfiled device data.  Last Pain:  Vitals:   07/31/22 1015  PainSc: Asleep         Complications: No notable events documented.

## 2022-07-31 NOTE — Progress Notes (Signed)
Orthopedic Tech Progress Note Patient Details:  Cassidy Smith 07-25-1955 [id number]  Ortho Devices Type of Ortho Device: Postop shoe/boot Ortho Device/Splint Location: RLE Ortho Device/Splint Interventions: Ordered, Application, Adjustment, Removal   Post Interventions Patient Tolerated: Well Instructions Provided: Care of device  Donald Pore 07/31/2022, 2:37 PM

## 2022-07-31 NOTE — Interval H&P Note (Signed)
History and Physical Interval Note:  07/31/2022 9:26 AM  Cassidy Smith  has presented today for surgery, with the diagnosis of Necrotic Wound Right Foot.  The various methods of treatment have been discussed with the patient and family. After consideration of risks, benefits and other options for treatment, the patient has consented to  Procedure(s): RIGHT FOOT DEBRIDEMENT AND TISSUE GRAFT (Right) as a surgical intervention.  The patient's history has been reviewed, patient examined, no change in status, stable for surgery.  I have reviewed the patient's chart and labs.  Questions were answered to the patient's satisfaction.     [name]

## 2022-07-31 NOTE — H&P (Signed)
Cassidy Smith is an 67 y.o. female.   Chief Complaint: Necrotic ulcer dorsum right foot and ankle. HPI: Patient is a 67 year old woman who is seen in follow-up for complex ankle fracture with soft tissue injury. Patient has been proceeding with Dial soap cleansing dressing changes and a fracture boot.  Patient sustained tissue breakdown from her previous immobilization.  Past Medical History:  Diagnosis Date   Anxiety    Arthritis    Bipolar 1 disorder (HCC)    Chronic deep vein thrombosis (DVT) of distal vein of lower extremity (HCC) 07/10/2017   Chronic hip pain    Depression    GI bleed    Headache(784.0)    History of UTI/bladder spams.    Hyperthyroidism    Hypokalemia 08/15/2017   Intracerebral hematoma (HCC) 07/04/2019   Ischemic stroke (HCC)    Neuromuscular disorder (HCC)    shaking of hands    Pneumonia of right lower lobe due to infectious organism 11/06/2019   Psychosis [facility])    Radial styloid fracture: right 03/06/2017   Rhabdomyolysis 06/07/2015   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   Thyrotoxicosis, unspecified with thyrotoxic crisis or storm 01/15/2019    Past Surgical History:  Procedure Laterality Date   COLONOSCOPY WITH PROPOFOL N/A 04/05/2017   Procedure: COLONOSCOPY WITH PROPOFOL;  Surgeon: Malissa Hippo, MD;  [facility];  Service: Endoscopy;  Laterality: N/A;   INTRAMEDULLARY (IM) NAIL INTERTROCHANTERIC Right 03/07/2017   Procedure: OPEN TREATMENT INTERNAL FIXATION RIGHT HIP WITH GAMA INTRAMEDULARY NAIL;  Surgeon: Vickki Hearing, MD;  Location: [facility];  Service: Orthopedics;  Laterality: Right;   MULTIPLE EXTRACTIONS WITH ALVEOLOPLASTY N/A 10/30/2012   Procedure: MULTIPLE EXTRACION 5, 6, 8, 9, 10 ,18, 19, 31 WITH MAXILLARY RIGHT AND LEFT  ALVEOLOPLASTY REDUCE MAXILLARY LEFT TUBEROSITY;  Surgeon: Georgia Lopes, DDS;  Location: [facility];  Service: Oral Surgery;  Laterality: N/A;   POLYPECTOMY  04/05/2017    Procedure: POLYPECTOMY;  Surgeon: Malissa Hippo, MD;  [facility];  Service: Endoscopy;;  recto-sigmoid, rectum    Family History  Problem Relation Age of Onset   Breast cancer Mother    Cancer - Other Mother    Alcoholism Father    Colon cancer Neg Hx    Colon polyps Neg Hx    Social History:  reports that she quit smoking about 5 years ago. Her smoking use included cigarettes. She started smoking about 25 years ago. She has a 10 pack-year smoking history. She has never used smokeless tobacco. She reports that she does not drink alcohol and does not use drugs.  Allergies: No Known Allergies  No medications prior to admission.    No results found for this or any previous visit (from the past 48 hour(s)). No results found.  Review of Systems  All other systems reviewed and are negative.   There were no vitals taken for this visit. Physical Exam  Patient is alert, oriented, no adenopathy, well-dressed, normal affect, normal respiratory effort. Examination clinically the fracture is stable radiograph shows hypertrophic callus.  She does have persistent eschar ulcer over the dorsum of the ankle that is 4 x 4 cm.  There is superficial epithelialization around the wound edges.  There is no drainage no cellulitis no exposed tendons.  The ulcers over the lateral foot are also improved. Assessment/Plan Closed fracture right ankle with delayed healing and large necrotic ulcer dorsally over the foot.  Plan: Will plan for surgical  excision of the necrotic ulcer plan for tissue grafting and wound VAC placement.  [name], MD 07/31/2022, 6:48 AM

## 2022-07-31 NOTE — Progress Notes (Signed)
Pharmacy Antibiotic Note  Cassidy Smith is a 67 y.o. female admitted on 07/31/2022 with  amputation with contaminated margins .  Pharmacy has been consulted for vancomycin dosing.  Topical vanc powder 1000mg  was used in OR 7/17.   Plan: Vancomycin 1000mg  IV x1 followed by vancomycin 1000mg  IV q 24 hours (eAUC 500)  -levels PRN per protocol -goal AUC 400-600  Ceftriaxone per MD   Height: 5\' 5"  (165.1 cm) Weight: 54.4 kg (120 lb) IBW/kg (Calculated) : 57  Temp (24hrs), Avg:98 F (36.7 C), Min:97.5 F (36.4 C), Max:98.5 F (36.9 C)  Recent Labs  Lab 07/31/22 0839  WBC 9.3  CREATININE 0.63    Estimated Creatinine Clearance: 57 mL/min (by C-G formula based on SCr of 0.63 mg/dL).    No Known Allergies  Antimicrobials this admission: Vanc 7/17> Ceftriaxone 7/17>   Dose adjustments this admission:   Microbiology results: 7/17 Wound culture:    Thank you for allowing pharmacy to be a part of this patient's care.  Calton Dach, PharmD Clinical Pharmacist 07/31/2022 1:03 PM

## 2022-07-31 NOTE — Op Note (Signed)
07/31/2022  10:18 AM  PATIENT:  Cassidy Smith    PRE-OPERATIVE DIAGNOSIS:  Necrotic Wound Right Foot  POST-OPERATIVE DIAGNOSIS:  Same  PROCEDURE:  RIGHT FOOT EXCISIONAL DEBRIDEMENT OF SKIN AND SOFT TISSUE MUSCLE FASCIA AND TENDON.  Application Kerecis tissue graft 38 cm and 1 g vancomycin powder. Application Prevena cleanse choice wound VAC sponge. Tissue sent for cultures.  SURGEON:  [name], MD  PHYSICIAN ASSISTANT: Shela Nevin ANESTHESIA:   General  PREOPERATIVE INDICATIONS:  Cassidy Smith is a  67 y.o. female with a diagnosis of Necrotic Wound Right Foot who failed conservative measures and elected for surgical management.    The risks benefits and alternatives were discussed with the patient preoperatively including but not limited to the risks of infection, bleeding, nerve injury, cardiopulmonary complications, the need for revision surgery, among others, and the patient was willing to proceed.  OPERATIVE IMPLANTS: Kerecis micro graft 38 cm.  * No implants in log *  @ENCIMAGES @  OPERATIVE FINDINGS: Patient had necrotic tissue that extended down to bone.  Of note patient's ankle-brachial indices show triphasic flow to the ankle with good great toe pressure of 82 mm.  OPERATIVE PROCEDURE: Patient was brought the operating room and underwent a MAC anesthetic.  After adequate levels anesthesia obtained patient's right lower extremity was prepped using DuraPrep draped into a sterile field a timeout was called.  A 21 blade knife and rondure was used to excise skin and soft tissue muscle and fascia and tendons.  This left a wound that was 5 x 6 cm.  The wound extended down to bone with necrotic tissue dorsal to the bone.  The neurovascular bundle was intact.  The wound was irrigated with normal saline electrocautery was used hemostasis.  The wound bed was filled with 38 cm of Kerecis micro graft and 1 g vancomycin powder.  The wound was covered with a Prevena cleanse  choice wound VAC sponge covered with derma tack this had a good suction fit overwrapped with Coban.  Patient was taken the PACU in stable condition.   DISCHARGE PLANNING:  Antibiotic duration: Broad-spectrum antibiotics started plan to adjust according to culture sensitivities.  Weightbearing: Touchdown weightbearing on the right  Pain medication: Opioid pathway  Dressing care/ Wound VAC: Wound VAC  Ambulatory devices: Walker  Plan to return to the operating room on Friday.  Will discuss further foot salvage intervention options versus potential for amputation.

## 2022-08-01 ENCOUNTER — Encounter (HOSPITAL_COMMUNITY): Payer: Self-pay | Admitting: Orthopedic Surgery

## 2022-08-01 LAB — GLUCOSE, CAPILLARY: Glucose-Capillary: 126 mg/dL — ABNORMAL HIGH (ref 70–99)

## 2022-08-01 LAB — CBC
HCT: 33.8 % — ABNORMAL LOW (ref 36.0–46.0)
Hemoglobin: 10.4 g/dL — ABNORMAL LOW (ref 12.0–15.0)
MCH: 28.5 pg (ref 26.0–34.0)
MCHC: 30.8 g/dL (ref 30.0–36.0)
MCV: 92.6 fL (ref 80.0–100.0)
Platelets: 157 10*3/uL (ref 150–400)
RBC: 3.65 MIL/uL — ABNORMAL LOW (ref 3.87–5.11)
RDW: 14.6 % (ref 11.5–15.5)
WBC: 7 10*3/uL (ref 4.0–10.5)
nRBC: 0 % (ref 0.0–0.2)

## 2022-08-01 NOTE — TOC Initial Note (Signed)
Transition of Care [facility]) - Initial/Assessment Note    Patient Details  Name: Cassidy Smith MRN: [id number] Date of Birth: 1955/03/02  Transition of Care [facility]) CM/SW Contact:    Lorri Frederick, LCSW Phone Number: 08/01/2022, 2:27 PM  Clinical Narrative:     Pt oriented x1-2.  Not able to meaningfully engage in conversation.  CSW spoke with pt son Timothy Lasso, who provided information.  Pt lives at home with brother Kathlene November.  Other brother [name] does not live there but is there daily to work.  Pt has HH aide daily from 8am-9pm.  Discussed PT recommendation for SNF.  Per Timothy Lasso, he thinks the family would prefer for pt to DC home with [facility].  He wanted to call his uncles and discuss.  [name] called back shortly, has spoken to his uncle Kathlene November, who is in agreement that they can assist pt as needed at home and utilize [facility] services.  DME in home: walker, wheelchair.    Procedure in OR tomorrow.  TOC will continue to follow.               Expected Discharge Plan: Home w Home Health Services Barriers to Discharge: Continued Medical Work up   Patient Goals and CMS Choice     Choice offered to / list presented to : Adult Children (son Engineer, manufacturing systems)      Expected Discharge Plan and Services In-house Referral: Clinical Social Work   Post Acute Care Choice: Home Health Living arrangements for the past 2 months: Single Family Home                                      Prior Living Arrangements/Services Living arrangements for the past 2 months: Single Family Home Lives with:: Relatives (lives with brother Kathlene November) Patient language and need for interpreter reviewed:: Yes        Need for Family Participation in Patient Care: Yes (Comment) Care giver support system in place?: Yes (comment) Current home services: Homehealth aide (Pt has HH aide daily from 8am-9pm) Criminal Activity/Legal Involvement Pertinent to Current Situation/Hospitalization: No - Comment as needed  Activities of Daily Living Home  Assistive Devices/Equipment: Environmental consultant (specify type), Oxygen, Wheelchair ADL Screening (condition at time of admission) Patient's cognitive ability adequate to safely complete daily activities?: No Is the patient deaf or have difficulty hearing?: No Does the patient have difficulty seeing, even when wearing glasses/contacts?: No Does the patient have difficulty concentrating, remembering, or making decisions?: No Patient able to express need for assistance with ADLs?: Yes Does the patient have difficulty dressing or bathing?: Yes Independently performs ADLs?: No Communication: Independent Dressing (OT): Needs assistance Is this a change from baseline?: Pre-admission baseline Grooming: Needs assistance Is this a change from baseline?: Pre-admission baseline Feeding: Independent Bathing: Needs assistance Is this a change from baseline?: Pre-admission baseline Toileting: Needs assistance Is this a change from baseline?: Pre-admission baseline In/Out Bed: Needs assistance Is this a change from baseline?: Pre-admission baseline Walks in Home: Needs assistance Is this a change from baseline?: Pre-admission baseline Does the patient have difficulty walking or climbing stairs?: Yes Weakness of Legs: Both Weakness of Arms/Hands: None  Permission Sought/Granted                  Emotional Assessment Appearance:: Appears older than stated age Attitude/Demeanor/Rapport: Engaged Affect (typically observed): Pleasant Orientation: : Oriented to Self, Oriented to Place  Admission diagnosis:  Abscess of right foot [L02.611] Patient Active Problem List   Diagnosis Date Noted   Cutaneous abscess of right foot 07/31/2022   Abscess of right foot 07/31/2022   Acute heart failure with preserved ejection fraction (HFpEF) (HCC) 02/20/2022   Acute respiratory failure with hypoxia (HCC) 02/20/2022   AKI (acute kidney injury) (HCC) 02/15/2022   Aspiration pneumonia (HCC) 02/15/2022    Leukocytosis 02/15/2022   Falls frequently 02/15/2022   SIRS (systemic inflammatory response syndrome) (HCC) 08/12/2020   Mild protein malnutrition (HCC) 08/12/2020   Sepsis (HCC) 07/25/2020   Sepsis with acute hypoxic respiratory failure without septic shock (HCC)    Dementia without behavioral disturbance (HCC)    Palliative care by specialist    Goals of care, counseling/discussion    Pneumonia of right lower lobe due to infectious organism 11/06/2019   Intracerebral hematoma (HCC) 07/04/2019   Closed head injury 07/03/2019   Thrombocytopenia (HCC) 07/03/2019   Thyrotoxicosis, unspecified with thyrotoxic crisis or storm 01/15/2019   History of syndrome of inappropriate antidiuretic hormone (SIADH) 01/15/2019   UTI (urinary tract infection) 01/04/2018   Neuromuscular disorder (HCC)    Dysuria 10/30/2017   Fever 08/15/2017   Hypokalemia 08/15/2017   Altered mental state 08/15/2017   Acute metabolic encephalopathy 08/15/2017   Valproic acid toxicity 08/15/2017   Rectal cancer (HCC) 07/10/2017   Chronic deep vein thrombosis (DVT) of distal vein of lower extremity (HCC) 07/10/2017   Altered mental status    Ischemic stroke (HCC)    GERD (gastroesophageal reflux disease) 05/12/2017   GI bleed    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   Dysplastic rectal polyp    Protein-calorie malnutrition, severe 04/07/2017   Pressure injury of skin 04/04/2017   Anticoagulated 04/03/2017   Stool bloody 04/03/2017   Current every day smoker 04/03/2017   Rectal bleeding    Chronic diarrhea    Hyperthyroidism    S/P ORIF (open reduction internal fixation) fracture right hip IM nail 03/07/17 03/24/2017   Hypomagnesemia 03/07/2017   Fall    Closed intertrochanteric fracture of hip, right, initial encounter (HCC) 03/06/2017   Radial styloid fracture: right 03/06/2017   Normocytic anemia 03/06/2017   Orthostatic hypotension 06/07/2015   Lower urinary tract infectious  disease 06/07/2015   Rhabdomyolysis 06/07/2015   White matter abnormality on MRI of brain 05/14/2013   History of depression 05/14/2013   Hx of anxiety disorder 05/14/2013   Bipolar I disorder, most recent episode (or current) manic (HCC) 05/14/2013   Bipolar 1 disorder (HCC) 05/14/2013   Hyponatremia 05/12/2013   Lacunar infarct, acute (HCC) 05/12/2013   PCP:  Toma Deiters, MD Pharmacy:   [facility] [phone] - [place], [place] - [address] [place] [address] Phone: [phone] Fax: [phone]  [facility] - [place], [place] - [address] [place] [address] Phone: [phone] Fax: [phone]     Social Determinants of Health (SDOH) Social History: SDOH Screenings   Food Insecurity: No Food Insecurity (07/31/2022)  Housing: Low Risk  (07/31/2022)  Transportation Needs: No Transportation Needs (07/31/2022)  Utilities: Not At Risk (07/31/2022)  Tobacco Use: Medium Risk (07/30/2022)   SDOH Interventions:     Readmission Risk Interventions    07/26/2020   12:20 PM  Readmission Risk Prevention Plan  Transportation Screening Complete  Home Care Screening Complete  Medication Review (RN CM) Complete

## 2022-08-01 NOTE — Progress Notes (Signed)
Patient ID: Cassidy Smith, female   DOB: Dec 02, 1955, 67 y.o.   MRN: [id number] Patient is postoperative day 1 debridement of large abscess dorsum of her right foot.  There was fibrinous nonviable tissue and exposed bone.  There is currently 75 cc in the wound VAC canister with a good suction fit.  Will plan for return to the operating room tomorrow with possible repeat tissue graft versus transtibial amputation.

## 2022-08-01 NOTE — Evaluation (Signed)
Occupational Therapy Evaluation Patient Details Name: Cassidy Smith MRN: [id number] DOB: Dec 28, 1955 Today's Date: 08/01/2022   History of Present Illness 67 year old woman who is seen in follow-up for complex ankle fracture with soft tissue injury, currently has large necrotic ulcer on dorsum of R foot. S/p I&D 7/17. RLE TWB with post op shoe. Return to OR 7/19 PMH: Bipolar 1, ischemic stroke, intracerebral hematoma, neuromuscular disorder,   Clinical Impression   Pt evaluated s/p above admission list. Pt reports independence with ADLs and mobility with rollator, brother assists with IADLs at baseline, pt questionable historian with no caregiver present to confirm. Pt presents this session with generalized weakness decreased balance, and cognition requiring simple 1 step commands throughout. However, pt oriented to self, place, situation and able to recall RLE WB precautions. Pt currently requires setup A for UB ADLs and mod A for LB ADLs. Pt completed STS transfer from EOB using RW with min A +2 and cues for hand placement and maintenance of TWB precaution. Pt performed step pivot transfer from EOB>recliner using RW with min A +2 and cues for sequencing task/RW management. Pt would benefit from continued acute OT services to maximize functional independence and facilitate transition to skilled inpatient follow up therapy, <3 hours/day. Pt likely to discharge home and would benefit from [facility] OT services.       Recommendations for follow up therapy are one component of a multi-disciplinary discharge planning process, led by the attending physician.  Recommendations may be updated based on patient status, additional functional criteria and insurance authorization.   Assistance Recommended at Discharge Frequent or constant Supervision/Assistance  Patient can return home with the following A little help with walking and/or transfers;A lot of help with bathing/dressing/bathroom;Assistance with  cooking/housework;Direct supervision/assist for medications management;Direct supervision/assist for financial management;Assist for transportation;Help with stairs or ramp for entrance    Functional Status Assessment  Patient has had a recent decline in their functional status and demonstrates the ability to make significant improvements in function in a reasonable and predictable amount of time.  Equipment Recommendations  Other (comment) (defer)    Recommendations for Other Services       Precautions / Restrictions Precautions Precautions: Fall Precaution Comments: hx of falling Required Braces or Orthoses: Other Brace Other Brace: post op shoe RLE Restrictions Weight Bearing Restrictions: Yes RLE Weight Bearing: Touchdown weight bearing Other Position/Activity Restrictions: Wound Vac      Mobility Bed Mobility Overal bed mobility: Needs Assistance Bed Mobility: Supine to Sit     Supine to sit: Min assist, HOB elevated     General bed mobility comments: assist at trunk    Transfers Overall transfer level: Needs assistance Equipment used: Rolling walker (2 wheels) Transfers: Sit to/from Stand, Bed to chair/wheelchair/BSC Sit to Stand: Min assist, +2 physical assistance, +2 safety/equipment Stand pivot transfers: Min assist, +2 physical assistance, +2 safety/equipment         General transfer comment: STS transfer from EOB using RW with min A +2 and min cues for hand placement and maintaining WB precautions. Stand pivot transfer from EOB>recliner using RW with min A +2 and mod multimodal cues for technique and RW management.      Balance Overall balance assessment: Needs assistance Sitting-balance support: Feet supported, No upper extremity supported Sitting balance-Leahy Scale: Fair Sitting balance - Comments: sitting EOB   Standing balance support: Bilateral upper extremity supported, Reliant on assistive device for balance, During functional  activity Standing balance-Leahy Scale: Poor Standing balance comment: BUE support on  RW                           ADL either performed or assessed with clinical judgement   ADL Overall ADL's : Needs assistance/impaired Eating/Feeding: Independent;Sitting   Grooming: Set up;Sitting   Upper Body Bathing: Set up;Sitting   Lower Body Bathing: Sit to/from stand;+2 for safety/equipment;+2 for physical assistance;Moderate assistance   Upper Body Dressing : Set up;Sitting   Lower Body Dressing: Sit to/from stand;+2 for safety/equipment;+2 for physical assistance;Moderate assistance   Toilet Transfer: Minimal assistance;+2 for physical assistance;+2 for safety/equipment;Stand-pivot;BSC/3in1;Rolling walker (2 wheels) Toilet Transfer Details (indicate cue type and reason): simulated Toileting- Clothing Manipulation and Hygiene: +2 for physical assistance;+2 for safety/equipment;Sit to/from stand;Moderate assistance       Functional mobility during ADLs: Minimal assistance;+2 for physical assistance;+2 for safety/equipment;Rolling walker (2 wheels) General ADL Comments: limited secondary to decreased balance, generalized weakness, RLE WB precautions     Vision Baseline Vision/History: 0 No visual deficits Ability to See in Adequate Light: 0 Adequate Patient Visual Report: Blurring of vision Vision Assessment?: No apparent visual deficits Additional Comments: Pt reports blurred vision at baseline and need for glasses     Perception Perception Perception Tested?: No   Praxis Praxis Praxis tested?: Not tested    Pertinent Vitals/Pain Pain Assessment Pain Assessment: No/denies pain Pain Intervention(s): Monitored during session     Hand Dominance Right   Extremity/Trunk Assessment Upper Extremity Assessment Upper Extremity Assessment: Generalized weakness   Lower Extremity Assessment Lower Extremity Assessment: Defer to PT evaluation   Cervical / Trunk  Assessment Cervical / Trunk Assessment: Kyphotic   Communication Communication Communication: Expressive difficulties   Cognition Arousal/Alertness: Awake/alert Behavior During Therapy: WFL for tasks assessed/performed Overall Cognitive Status: No family/caregiver present to determine baseline cognitive functioning                                 General Comments: Oriented to self, situation and place. Disoriented to time. Pt following simple 1 step commands with increased time. Able to recall RLE WB precautions.     General Comments  VSS on RA    Exercises     Shoulder Instructions      Home Living Family/patient expects to be discharged to:: Private residence Living Arrangements: Other relatives (brothers) Available Help at Discharge: Family;Available 24 hours/day Type of Home: House Home Access: Stairs to enter [employer] of Steps: 1 (1 step outside and 3 steps inside) Entrance Stairs-Rails: Right Home Layout: Able to live on main level with bedroom/bathroom     Bathroom Shower/Tub: Chief Strategy Officer: Standard     Home Equipment: Rollator (4 wheels)   Additional Comments: poor historian, no family present to verify      Prior Functioning/Environment Prior Level of Function : Needs assist             Mobility Comments: uses rollator ADLs Comments: independent with ADLs, brothers assist with iADLs. Has PCA from 8am to 9pm daily per chart review.        OT Problem List: Decreased strength;Decreased activity tolerance;Impaired balance (sitting and/or standing);Decreased cognition      OT Treatment/Interventions: Self-care/ADL training;Therapeutic exercise;Energy conservation;DME and/or AE instruction;Therapeutic activities;Cognitive remediation/compensation;Patient/family education;Balance training    OT Goals(Current goals can be found in the care plan section) Acute Rehab OT Goals Patient Stated Goal: to sit in  the chair OT Goal Formulation:  With patient Time For Goal Achievement: 08/15/22 Potential to Achieve Goals: Good ADL Goals Pt Will Perform Lower Body Dressing: with modified independence;sit to/from stand Pt Will Transfer to Toilet: with modified independence;bedside commode;stand pivot transfer Pt Will Perform Toileting - Clothing Manipulation and hygiene: with modified independence;sit to/from stand Additional ADL Goal #1: Pt will complete 2 step ADL task with min cues  OT Frequency: Min 2X/week    Co-evaluation              AM-PAC OT "6 Clicks" Daily Activity     Outcome Measure Help from another person eating meals?: None Help from another person taking care of personal grooming?: A Little Help from another person toileting, which includes using toliet, bedpan, or urinal?: A Little Help from another person bathing (including washing, rinsing, drying)?: A Lot Help from another person to put on and taking off regular upper body clothing?: A Little Help from another person to put on and taking off regular lower body clothing?: A Lot 6 Click Score: 17   End of Session Equipment Utilized During Treatment: Gait belt;Rolling walker (2 wheels) Nurse Communication: Mobility status  Activity Tolerance: Patient tolerated treatment well Patient left: in chair;with call bell/phone within reach;with chair alarm set  OT Visit Diagnosis: Unsteadiness on feet (R26.81);Muscle weakness (generalized) (M62.81);History of falling (Z91.81)                Time: 8413-2440 OT Time Calculation (min): 12 min Charges:  OT General Charges $OT Visit: 1 Visit OT Evaluation $OT Eval Moderate Complexity: 1 Mod  Sherley Bounds, OTS Acute Rehabilitation Services Office [phone] Secure Chat Communication Preferred   Sherley Bounds 08/01/2022, 4:49 PM

## 2022-08-01 NOTE — Evaluation (Signed)
Physical Therapy Evaluation Patient Details Name: Cassidy Smith MRN: [id number] DOB: [birth year] Today's Date: 08/01/2022  History of Present Illness  67 year old woman who is seen in follow-up for complex ankle fracture with soft tissue injury, currently has large necrotic ulcer on dorsum of R foot. S/p I&D 7/17. RLE TWB with post op shoe. Return to OR 7/19 PMH: Bipolar 1, ischemic stroke, intracerebral hematoma, neuromuscular disorder,  Clinical Impression  Pt noted to be poor historian, but reports that PTA she was living in a multistory home with her 2 brothers. Home has 4 steps to enter and while able to stay downstairs pt's room is upstairs. Pt reports she was walking with Rollator and bathing and dressing but that her brothers took care of everything else. Pt is currently limited in safe mobility by profound weakness, ataxic movement, and decreased balance, in presence of TWB through RLE with postop shoe.  Pt is min A for bed mobility and maxA to transfer to chair.  Patient will benefit from continued inpatient follow up therapy, <3 hours/day. PT will continue to see acutely and refer to Mobility Specialist.        Assistance Recommended at Discharge Frequent or constant Supervision/Assistance  If plan is discharge home, recommend the following:  Can travel by private vehicle  A lot of help with walking and/or transfers;A lot of help with bathing/dressing/bathroom;Assistance with cooking/housework;Direct supervision/assist for medications management;Direct supervision/assist for financial management;Assist for transportation;Help with stairs or ramp for entrance   No    Equipment Recommendations None recommended by PT     Functional Status Assessment Patient has had a recent decline in their functional status and demonstrates the ability to make significant improvements in function in a reasonable and predictable amount of time.     Precautions / Restrictions  Precautions Precautions: Fall Precaution Comments: hx of falling Required Braces or Orthoses: Other Brace Other Brace: post op shoe RLE, Restrictions Weight Bearing Restrictions: Yes RLE Weight Bearing: Touchdown weight bearing Other Position/Activity Restrictions: Wound Vac      Mobility  Bed Mobility Overal bed mobility: Needs Assistance Bed Mobility: Supine to Sit     Supine to sit: Min assist, HOB elevated     General bed mobility comments: heavy use of bed rail to pull to EoB, min A for pad scoot of hips to EoB    Transfers Overall transfer level: Needs assistance Equipment used: Rolling walker (2 wheels) Transfers: Sit to/from Stand, Bed to chair/wheelchair/BSC Sit to Stand: Total assist, From elevated surface     Squat pivot transfers: Max assist     General transfer comment: pt unable to come to fully upright in RW, requires maxA for squat pivot transfer on L LE to chair    Ambulation/Gait               General Gait Details: unable        Balance Overall balance assessment: Needs assistance Sitting-balance support: Feet supported, Bilateral upper extremity supported, Single extremity supported, No upper extremity supported Sitting balance-Leahy Scale: Fair       Standing balance-Leahy Scale: Zero                               Pertinent Vitals/Pain Pain Assessment Pain Assessment: No/denies pain    Home Living Family/patient expects to be discharged to:: Private residence Living Arrangements: Other relatives (brothers) Available Help at Discharge: Family;Available 24 hours/day Type of Home: House Home Access: Stairs  to enter Entrance Stairs-Rails: Right Entrance Stairs-Number of Steps: 1 (1 step outside and 3 steps inside)   Home Layout: Able to live on main level with bedroom/bathroom Home Equipment: Rollator (4 wheels) Additional Comments: poor historian, no family present to verify    Prior Function Prior Level of  Function : Needs assist             Mobility Comments: use Rollator ADLs Comments: independent with ADLs, brothers assist with iADLs.     Hand Dominance   Dominant Hand: Right    Extremity/Trunk Assessment   Upper Extremity Assessment Upper Extremity Assessment: Defer to OT evaluation    Lower Extremity Assessment Lower Extremity Assessment: RLE deficits/detail;LLE deficits/detail RLE Deficits / Details: hip ROM WFL, lacking full knee ext/flex, and dorsiflexion, strength in hip and knee grossly 2+/5 from longterm decreased mobility, ataxic movement RLE Sensation: WNL LLE Deficits / Details: ROM WFL, strength grossly 3/5, ataxic movement LLE Coordination: decreased fine motor;decreased gross motor    Cervical / Trunk Assessment Cervical / Trunk Assessment: Kyphotic  Communication   Communication: Expressive difficulties (dry lips)  Cognition Arousal/Alertness: Awake/alert Behavior During Therapy: Flat affect Overall Cognitive Status: No family/caregiver present to determine baseline cognitive functioning                                          General Comments General comments (skin integrity, edema, etc.): VSS on RA        Assessment/Plan    PT Assessment Patient needs continued PT services  PT Problem List Decreased strength;Decreased range of motion;Decreased activity tolerance;Decreased balance;Decreased mobility;Decreased coordination;Decreased cognition;Decreased skin integrity;Pain       PT Treatment Interventions DME instruction;Gait training;Functional mobility training;Therapeutic activities;Therapeutic exercise;Balance training;Cognitive remediation;Patient/family education    PT Goals (Current goals can be found in the Care Plan section)  Acute Rehab PT Goals Patient Stated Goal: see her Bulldog PT Goal Formulation: With patient Time For Goal Achievement: 08/15/22 Potential to Achieve Goals: Fair    Frequency Min 2X/week         AM-PAC PT "6 Clicks" Mobility  Outcome Measure Help needed turning from your back to your side while in a flat bed without using bedrails?: None Help needed moving from lying on your back to sitting on the side of a flat bed without using bedrails?: A Little Help needed moving to and from a bed to a chair (including a wheelchair)?: Total Help needed standing up from a chair using your arms (e.g., wheelchair or bedside chair)?: Total Help needed to walk in hospital room?: Total Help needed climbing 3-5 steps with a railing? : Total 6 Click Score: 11    End of Session Equipment Utilized During Treatment: Gait belt Activity Tolerance: Patient tolerated treatment well Patient left: in chair;with call bell/phone within reach;with chair alarm set Nurse Communication: Mobility status PT Visit Diagnosis: Unsteadiness on feet (R26.81);Other abnormalities of gait and mobility (R26.89);Muscle weakness (generalized) (M62.81);Ataxic gait (R26.0);History of falling (Z91.81);Difficulty in walking, not elsewhere classified (R26.2);Pain Pain - Right/Left: Right Pain - part of body: Ankle and joints of foot    Time: 2956-2130 PT Time Calculation (min) (ACUTE ONLY): 33 min   Charges:   PT Evaluation $PT Eval Moderate Complexity: 1 Mod PT Treatments $Therapeutic Activity: 8-22 mins PT General Charges $$ ACUTE PT VISIT: 1 Visit         Ajai Terhaar B. Beverely Risen PT, DPT  Acute Rehabilitation Services Please use secure chat or  Call Office [phone]   Elon Alas [facility] 08/01/2022, 10:09 AM

## 2022-08-02 ENCOUNTER — Encounter: Payer: Self-pay | Admitting: Orthopedic Surgery

## 2022-08-02 LAB — SURGICAL PCR SCREEN
MRSA, PCR: NEGATIVE
Staphylococcus aureus: NEGATIVE

## 2022-08-02 LAB — AEROBIC/ANAEROBIC CULTURE W GRAM STAIN (SURGICAL/DEEP WOUND)

## 2022-08-02 MED ORDER — TRANEXAMIC ACID 1000 MG/10ML IV SOLN
2000.0000 mg | INTRAVENOUS | Status: DC
  Filled 2022-08-02: qty 20

## 2022-08-02 NOTE — H&P (View-Only) (Signed)
Patient ID: Cassidy Smith, female   DOB: 24-Apr-1955, 67 y.o.   MRN: [id number] Debridement right foot vs BKA Saturday

## 2022-08-02 NOTE — Consult Note (Signed)
   [facility] Inpatient Consult   08/02/2022  Cassidy Smith 08-Sep-1955 [id number]  [facility] [[facility]]  Accountable Care Organization [ACO] Patient: Medicare ACO REACH  Primary Care Provider:  Toma Deiters, MD with [facility]   Patient screened for hospitalization with noted high risk score for unplanned readmission risk and to assess for potential [facility]  [[facility]] Care Management service needs for post hospital transition for care coordination.  Review of patient's electronic medical record reveals patient is for home with [facility] was recommended for SNF rehab. Came by to speak with patient regarding post hospital follow up.   Plan:  Referral request for community care coordination:  make referral for readmission prevention follow up.  Of note, [name] Care Management/Population Health does not replace or interfere with any arrangements made by the Inpatient Transition of Care team.  For questions contact:   Charlesetta Shanks, RN BSN CCM [facility]  [phone] business mobile phone Toll free office [phone]  *Concierge Line  [phone] Fax number: [phone] [email or web address]

## 2022-08-02 NOTE — Progress Notes (Signed)
Office Visit Note   Patient: Cassidy Smith           Date of Birth: 06/07/1955           MRN: [id number] Visit Date: 07/25/2022              Requested by: Toma Deiters, MD [address] [place],  [place] [address] PCP: Toma Deiters, MD  Chief Complaint  Patient presents with   Right Ankle - Fracture, Wound Check, Follow-up      HPI: Pressure eschar right foot dorsal  Assessment & Plan: Visit Diagnoses:  1. Pressure injury of deep tissue of dorsum of right foot   2. Wound of right ankle, subsequent encounter     Plan: eschar debrided, post for surgical debridement, may need multiple debridements or possible amputation  Follow-Up Instructions: Return in about 1 week (around 08/01/2022).   Ortho Exam  Patient is alert, oriented, no adenopathy, well-dressed, normal affect, normal respiratory effort. 5 cm necrotic ulcer dorsal right foot, exposed tendons, ulcer debrided of skin and soft tissue with 10 blade knife.  Imaging: No results found. No images are attached to the encounter.  Labs: Lab Results  Component Value Date   HGBA1C 5.0 06/10/2017   HGBA1C 5.4 05/13/2013   ESRSEDRATE 2 05/13/2013   LABURIC 2.4 04/11/2017   REPTSTATUS PENDING 07/31/2022   GRAMSTAIN  07/31/2022    RARE WBC PRESENT,BOTH PMN AND MONONUCLEAR NO ORGANISMS SEEN    CULT  07/31/2022    CULTURE REINCUBATED FOR BETTER GROWTH Performed at [facility], [address] [place], [place] [address]    LABORGA ESCHERICHIA COLI (A) 07/25/2020     Lab Results  Component Value Date   ALBUMIN 2.0 (L) 02/21/2022   ALBUMIN 3.2 (L) 08/12/2020   ALBUMIN 3.3 (L) 07/25/2020    Lab Results  Component Value Date   MG 1.9 02/24/2022   MG 1.5 (L) 02/23/2022   MG 1.6 (L) 02/22/2022   Lab Results  Component Value Date   VD25OH 49.2 03/06/2017    No results found for: "PREALBUMIN"    Latest Ref Rng & Units 08/01/2022    1:21 AM 07/31/2022    8:39 AM 04/11/2022   12:56 PM  CBC  EXTENDED  WBC 4.0 - 10.5 K/uL 7.0  9.3  10.8   RBC 3.87 - 5.11 MIL/uL 3.65  4.08  3.93   Hemoglobin 12.0 - 15.0 g/dL 82.9  56.2  13.0   HCT 36.0 - 46.0 % 33.8  38.1  36.7   Platelets 150 - 400 K/uL 157  160  228   NEUT# 1.7 - 7.7 K/uL   5.7   Lymph# 0.7 - 4.0 K/uL   3.6      There is no height or weight on file to calculate BMI.  Orders:  No orders of the defined types were placed in this encounter.  No orders of the defined types were placed in this encounter.    Procedures: No procedures performed  Clinical Data: No additional findings.  ROS:  All other systems negative, except as noted in the HPI. Review of Systems  Objective: Vital Signs: There were no vitals taken for this visit.  Specialty Comments:  No specialty comments available.  PMFS History: Patient Active Problem List   Diagnosis Date Noted   Cutaneous abscess of right foot 07/31/2022   Abscess of right foot 07/31/2022   Acute heart failure with preserved ejection fraction (HFpEF) (HCC) 02/20/2022  Acute respiratory failure with hypoxia (HCC) 02/20/2022   AKI (acute kidney injury) (HCC) 02/15/2022   Aspiration pneumonia (HCC) 02/15/2022   Leukocytosis 02/15/2022   Falls frequently 02/15/2022   SIRS (systemic inflammatory response syndrome) (HCC) 08/12/2020   Mild protein malnutrition (HCC) 08/12/2020   Sepsis (HCC) 07/25/2020   Sepsis with acute hypoxic respiratory failure without septic shock (HCC)    Dementia without behavioral disturbance (HCC)    Palliative care by specialist    Goals of care, counseling/discussion    Pneumonia of right lower lobe due to infectious organism 11/06/2019   Intracerebral hematoma (HCC) 07/04/2019   Closed head injury 07/03/2019   Thrombocytopenia (HCC) 07/03/2019   Thyrotoxicosis, unspecified with thyrotoxic crisis or storm 01/15/2019   History of syndrome of inappropriate antidiuretic hormone (SIADH) 01/15/2019   UTI (urinary tract infection) 01/04/2018    Neuromuscular disorder (HCC)    Dysuria 10/30/2017   Fever 08/15/2017   Hypokalemia 08/15/2017   Altered mental state 08/15/2017   Acute metabolic encephalopathy 08/15/2017   Valproic acid toxicity 08/15/2017   Rectal cancer (HCC) 07/10/2017   Chronic deep vein thrombosis (DVT) of distal vein of lower extremity (HCC) 07/10/2017   Altered mental status    Ischemic stroke (HCC)    GERD (gastroesophageal reflux disease) 05/12/2017   GI bleed    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   Dysplastic rectal polyp    Protein-calorie malnutrition, severe 04/07/2017   Pressure injury of skin 04/04/2017   Anticoagulated 04/03/2017   Stool bloody 04/03/2017   Current every day smoker 04/03/2017   Rectal bleeding    Chronic diarrhea    Hyperthyroidism    S/P ORIF (open reduction internal fixation) fracture right hip IM nail 03/07/17 03/24/2017   Hypomagnesemia 03/07/2017   Fall    Closed intertrochanteric fracture of hip, right, initial encounter (HCC) 03/06/2017   Radial styloid fracture: right 03/06/2017   Normocytic anemia 03/06/2017   Orthostatic hypotension 06/07/2015   Lower urinary tract infectious disease 06/07/2015   Rhabdomyolysis 06/07/2015   White matter abnormality on MRI of brain 05/14/2013   History of depression 05/14/2013   Hx of anxiety disorder 05/14/2013   Bipolar I disorder, most recent episode (or current) manic (HCC) 05/14/2013   Bipolar 1 disorder (HCC) 05/14/2013   Hyponatremia 05/12/2013   Lacunar infarct, acute (HCC) 05/12/2013   Past Medical History:  Diagnosis Date   Anxiety    Arthritis    Bipolar 1 disorder (HCC)    Chronic deep vein thrombosis (DVT) of distal vein of lower extremity (HCC) 07/10/2017   Chronic hip pain    Depression    GI bleed    Headache(784.0)    History of UTI/bladder spams.    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   Thyrotoxicosis, unspecified with thyrotoxic crisis or storm 01/15/2019    Family History  Problem Relation Age of Onset   Breast cancer Mother    Cancer - Other Mother    Alcoholism Father    Colon cancer Neg Hx    Colon polyps Neg Hx     Past Surgical History:  Procedure Laterality Date  COLONOSCOPY WITH PROPOFOL N/A 04/05/2017   Procedure: COLONOSCOPY WITH PROPOFOL;  Surgeon: Malissa Hippo, MD;  Location: [facility] ENDO SUITE;  Service: Endoscopy;  Laterality: N/A;   I & D EXTREMITY Right 07/31/2022   Procedure: RIGHT FOOT DEBRIDEMENT AND TISSUE GRAFT;  Surgeon: [name], MD;  [facility];  Service: Orthopedics;  Laterality: Right;   INTRAMEDULLARY (IM) NAIL INTERTROCHANTERIC Right 03/07/2017   Procedure: OPEN TREATMENT INTERNAL FIXATION RIGHT HIP WITH GAMA INTRAMEDULARY NAIL;  Surgeon: Vickki Hearing, MD;  Location: [facility] ORS;  Service: Orthopedics;  Laterality: Right;   MULTIPLE EXTRACTIONS WITH ALVEOLOPLASTY N/A 10/30/2012   Procedure: MULTIPLE EXTRACION 5, 6, 8, 9, 10 ,18, 19, 31 WITH MAXILLARY RIGHT AND LEFT  ALVEOLOPLASTY REDUCE MAXILLARY LEFT TUBEROSITY;  Surgeon: Georgia Lopes, DDS;  Location: [facility] OR;  Service: Oral Surgery;  Laterality: N/A;   POLYPECTOMY  04/05/2017   Procedure: POLYPECTOMY;  Surgeon: Malissa Hippo, MD;  Location: [facility];  Service: Endoscopy;;  recto-sigmoid, rectum   Social History   Occupational History   Not on file  Tobacco Use   Smoking status: Former    Current packs/day: 0.00    Average packs/day: 0.5 packs/day for 20.0 years (10.0 ttl pk-yrs)    Types: Cigarettes    Start date: 05/05/1997    Quit date: 05/05/2017    Years since quitting: 5.2    Smokeless tobacco: Never   Tobacco comments:    patient states she quit one week ago  Vaping Use   Vaping status: Never Used  Substance and Sexual Activity   Alcohol use: No   Drug use: No   Sexual activity: Not on file

## 2022-08-02 NOTE — Progress Notes (Signed)
Patient ID: Cassidy Smith, female   DOB: 24-Apr-1955, 67 y.o.   MRN: [id number] Debridement right foot vs BKA Saturday

## 2022-08-02 NOTE — Plan of Care (Signed)

## 2022-08-03 ENCOUNTER — Inpatient Hospital Stay (HOSPITAL_COMMUNITY): Payer: Medicare Other | Admitting: Anesthesiology

## 2022-08-03 ENCOUNTER — Encounter (HOSPITAL_COMMUNITY)
Admission: RE | Disposition: A | Discharge status: Home Health | Payer: Self-pay | Source: Home / Self Care | Attending: Orthopedic Surgery

## 2022-08-03 ENCOUNTER — Other Ambulatory Visit: Payer: Self-pay

## 2022-08-03 ENCOUNTER — Encounter (HOSPITAL_COMMUNITY): Payer: Self-pay | Admitting: Orthopedic Surgery

## 2022-08-03 DIAGNOSIS — L02611 Cutaneous abscess of right foot: Secondary | ICD-10-CM | POA: Diagnosis not present

## 2022-08-03 DIAGNOSIS — E876 Hypokalemia: Secondary | ICD-10-CM | POA: Diagnosis not present

## 2022-08-03 DIAGNOSIS — I679 Cerebrovascular disease, unspecified: Secondary | ICD-10-CM

## 2022-08-03 DIAGNOSIS — S91301D Unspecified open wound, right foot, subsequent encounter: Secondary | ICD-10-CM

## 2022-08-03 DIAGNOSIS — I5031 Acute diastolic (congestive) heart failure: Secondary | ICD-10-CM | POA: Diagnosis not present

## 2022-08-03 HISTORY — PX: I & D EXTREMITY: SHX5045

## 2022-08-03 SURGERY — IRRIGATION AND DEBRIDEMENT EXTREMITY
Anesthesia: Monitor Anesthesia Care | Site: Foot | Laterality: Right

## 2022-08-03 MED ORDER — POVIDONE-IODINE 10 % EX SWAB
2.0000 | Freq: Once | CUTANEOUS | Status: AC
  Administered 2022-08-03: 2 via TOPICAL

## 2022-08-03 MED ORDER — OXYCODONE HCL 5 MG PO TABS: 5.0000 mg | ORAL_TABLET | Freq: Once | ORAL | Status: DC | PRN

## 2022-08-03 MED ORDER — FENTANYL CITRATE (PF) 250 MCG/5ML IJ SOLN
INTRAMUSCULAR | Status: AC
  Filled 2022-08-03: qty 5

## 2022-08-03 MED ORDER — JUVEN PO PACK: 1.0000 | PACK | Freq: Two times a day (BID) | ORAL | Status: DC

## 2022-08-03 MED ORDER — LACTATED RINGERS IV SOLN: INTRAVENOUS | Status: DC

## 2022-08-03 MED ORDER — VITAMIN C 500 MG PO TABS: 1000.0000 mg | ORAL_TABLET | Freq: Every day | ORAL | Status: DC

## 2022-08-03 MED ORDER — CHLORHEXIDINE GLUCONATE 4 % EX SOLN: 60.0000 mL | Freq: Once | CUTANEOUS | Status: DC

## 2022-08-03 MED ORDER — MAGNESIUM SULFATE 2 GM/50ML IV SOLN: 2.0000 g | Freq: Every day | INTRAVENOUS | Status: DC | PRN

## 2022-08-03 MED ORDER — MAGNESIUM CITRATE PO SOLN: 1.0000 | Freq: Once | ORAL | Status: DC | PRN

## 2022-08-03 MED ORDER — CEFAZOLIN SODIUM-DEXTROSE 2-4 GM/100ML-% IV SOLN: 2.0000 g | Freq: Three times a day (TID) | INTRAVENOUS | Status: DC

## 2022-08-03 MED ORDER — DOCUSATE SODIUM 100 MG PO CAPS: 100.0000 mg | ORAL_CAPSULE | Freq: Every day | ORAL | Status: DC

## 2022-08-03 MED ORDER — BISACODYL 5 MG PO TBEC: 5.0000 mg | DELAYED_RELEASE_TABLET | Freq: Every day | ORAL | Status: DC | PRN

## 2022-08-03 MED ORDER — METOPROLOL TARTRATE 5 MG/5ML IV SOLN: 2.0000 mg | INTRAVENOUS | Status: DC | PRN

## 2022-08-03 MED ORDER — CHLORHEXIDINE GLUCONATE 0.12 % MT SOLN: 15.0000 mL | Freq: Once | OROMUCOSAL | Status: AC

## 2022-08-03 MED ORDER — CHLORHEXIDINE GLUCONATE 0.12 % MT SOLN
OROMUCOSAL | Status: AC
  Administered 2022-08-03: 15 mL via OROMUCOSAL
  Filled 2022-08-03: qty 15

## 2022-08-03 MED ORDER — ORAL CARE MOUTH RINSE: 15.0000 mL | Freq: Once | OROMUCOSAL | Status: AC

## 2022-08-03 MED ORDER — ZINC SULFATE 220 (50 ZN) MG PO CAPS: 220.0000 mg | ORAL_CAPSULE | Freq: Every day | ORAL | Status: DC

## 2022-08-03 MED ORDER — ACETAMINOPHEN 500 MG PO TABS: 1000.0000 mg | ORAL_TABLET | Freq: Once | ORAL | Status: DC | PRN

## 2022-08-03 MED ORDER — LIDOCAINE 2% (20 MG/ML) 5 ML SYRINGE
INTRAMUSCULAR | Status: DC | PRN
  Administered 2022-08-03: 40 mg via INTRAVENOUS

## 2022-08-03 MED ORDER — 0.9 % SODIUM CHLORIDE (POUR BTL) OPTIME
TOPICAL | Status: DC | PRN
  Administered 2022-08-03: 1000 mL

## 2022-08-03 MED ORDER — GUAIFENESIN-DM 100-10 MG/5ML PO SYRP: 15.0000 mL | ORAL_SOLUTION | ORAL | Status: DC | PRN

## 2022-08-03 MED ORDER — POLYETHYLENE GLYCOL 3350 17 G PO PACK: 17.0000 g | PACK | Freq: Every day | ORAL | Status: DC | PRN

## 2022-08-03 MED ORDER — ACETAMINOPHEN 160 MG/5ML PO SOLN: 1000.0000 mg | Freq: Once | ORAL | Status: DC | PRN

## 2022-08-03 MED ORDER — TRANEXAMIC ACID-NACL 1000-0.7 MG/100ML-% IV SOLN
1000.0000 mg | INTRAVENOUS | Status: AC
  Administered 2022-08-03: 1000 mg via INTRAVENOUS

## 2022-08-03 MED ORDER — PHENYLEPHRINE HCL-NACL 20-0.9 MG/250ML-% IV SOLN
INTRAVENOUS | Status: AC
  Filled 2022-08-03: qty 250

## 2022-08-03 MED ORDER — CEFAZOLIN SODIUM-DEXTROSE 2-4 GM/100ML-% IV SOLN: 2.0000 g | INTRAVENOUS | Status: DC

## 2022-08-03 MED ORDER — ALUM & MAG HYDROXIDE-SIMETH 200-200-20 MG/5ML PO SUSP: 15.0000 mL | ORAL | Status: DC | PRN

## 2022-08-03 MED ORDER — POTASSIUM CHLORIDE CRYS ER 20 MEQ PO TBCR: 20.0000 meq | EXTENDED_RELEASE_TABLET | Freq: Every day | ORAL | Status: DC | PRN

## 2022-08-03 MED ORDER — ONDANSETRON HCL 4 MG/2ML IJ SOLN: 4.0000 mg | Freq: Four times a day (QID) | INTRAMUSCULAR | Status: DC | PRN

## 2022-08-03 MED ORDER — OXYCODONE HCL 5 MG/5ML PO SOLN: 5.0000 mg | Freq: Once | ORAL | Status: DC | PRN

## 2022-08-03 MED ORDER — POVIDONE-IODINE 10 % EX SWAB: 2.0000 | Freq: Once | CUTANEOUS | Status: DC

## 2022-08-03 MED ORDER — CEFAZOLIN SODIUM-DEXTROSE 2-4 GM/100ML-% IV SOLN
2.0000 g | INTRAVENOUS | Status: AC
  Administered 2022-08-03: 2 g via INTRAVENOUS

## 2022-08-03 MED ORDER — FENTANYL CITRATE (PF) 100 MCG/2ML IJ SOLN: 25.0000 ug | INTRAMUSCULAR | Status: DC | PRN

## 2022-08-03 MED ORDER — LABETALOL HCL 5 MG/ML IV SOLN: 10.0000 mg | INTRAVENOUS | Status: DC | PRN

## 2022-08-03 MED ORDER — CEFAZOLIN SODIUM-DEXTROSE 2-4 GM/100ML-% IV SOLN
INTRAVENOUS | Status: AC
  Filled 2022-08-03: qty 100

## 2022-08-03 MED ORDER — ACETAMINOPHEN 10 MG/ML IV SOLN: 1000.0000 mg | Freq: Once | INTRAVENOUS | Status: DC | PRN

## 2022-08-03 MED ORDER — HYDRALAZINE HCL 20 MG/ML IJ SOLN: 5.0000 mg | INTRAMUSCULAR | Status: DC | PRN

## 2022-08-03 MED ORDER — SODIUM CHLORIDE 0.9 % IV SOLN: INTRAVENOUS | Status: DC

## 2022-08-03 MED ORDER — PHENOL 1.4 % MT LIQD: 1.0000 | OROMUCOSAL | Status: DC | PRN

## 2022-08-03 MED ORDER — PANTOPRAZOLE SODIUM 40 MG PO TBEC: 40.0000 mg | DELAYED_RELEASE_TABLET | Freq: Every day | ORAL | Status: DC

## 2022-08-03 MED ORDER — TRANEXAMIC ACID-NACL 1000-0.7 MG/100ML-% IV SOLN
INTRAVENOUS | Status: AC
  Filled 2022-08-03: qty 100

## 2022-08-03 MED ORDER — PROPOFOL 10 MG/ML IV BOLUS
INTRAVENOUS | Status: AC
  Filled 2022-08-03: qty 20

## 2022-08-03 MED ORDER — PROPOFOL 10 MG/ML IV BOLUS
INTRAVENOUS | Status: DC | PRN
  Administered 2022-08-03: 50 ug/kg/min via INTRAVENOUS
  Administered 2022-08-03: 20 mg via INTRAVENOUS

## 2022-08-03 MED ORDER — PHENYLEPHRINE HCL-NACL 20-0.9 MG/250ML-% IV SOLN
INTRAVENOUS | Status: DC | PRN
  Administered 2022-08-03: 15 ug/min via INTRAVENOUS

## 2022-08-03 SURGICAL SUPPLY — 23 items
BLADE SURG 21 STRL SS (BLADE) ×2 IMPLANT
CANISTER WOUND CARE 500ML ATS (WOUND CARE) IMPLANT
COVER SURGICAL LIGHT HANDLE (MISCELLANEOUS) ×4 IMPLANT
DRAPE U-SHAPE 47X51 STRL (DRAPES) ×2 IMPLANT
DRESSING VERAFLO CLEANS CC MED (GAUZE/BANDAGES/DRESSINGS) IMPLANT
DRSG VERAFLO CLEANSE CC MED (GAUZE/BANDAGES/DRESSINGS) ×1
DURAPREP 26ML APPLICATOR (WOUND CARE) ×2 IMPLANT
ELECT REM PT RETURN 9FT ADLT (ELECTROSURGICAL) ×1
ELECTRODE REM PT RTRN 9FT ADLT (ELECTROSURGICAL) IMPLANT
GLOVE BIOGEL PI IND STRL 9 (GLOVE) ×2 IMPLANT
GLOVE SURG ORTHO 9.0 STRL STRW (GLOVE) ×2 IMPLANT
GOWN STRL REUS W/ TWL XL LVL3 (GOWN DISPOSABLE) ×4 IMPLANT
GOWN STRL REUS W/TWL XL LVL3 (GOWN DISPOSABLE) ×2
GRAFT SKIN WND MICRO 38 (Tissue) IMPLANT
KIT BASIN OR (CUSTOM PROCEDURE TRAY) ×2 IMPLANT
KIT TURNOVER KIT B (KITS) ×2 IMPLANT
MANIFOLD NEPTUNE II (INSTRUMENTS) ×2 IMPLANT
NS IRRIG 1000ML POUR BTL (IV SOLUTION) ×2 IMPLANT
PACK ORTHO EXTREMITY (CUSTOM PROCEDURE TRAY) ×2 IMPLANT
PAD NEG PRESSURE SENSATRAC (MISCELLANEOUS) IMPLANT
TOWEL GREEN STERILE (TOWEL DISPOSABLE) ×2 IMPLANT
TUBE CONNECTING 12X1/4 (SUCTIONS) ×2 IMPLANT
YANKAUER SUCT BULB TIP NO VENT (SUCTIONS) ×2 IMPLANT

## 2022-08-03 NOTE — Anesthesia Preprocedure Evaluation (Signed)
Anesthesia Evaluation  Patient identified by MRN, date of birth, ID band Patient awake    Reviewed: Allergy & Precautions, NPO status , Patient's Chart, lab work & pertinent test results  History of Anesthesia Complications Negative for: history of anesthetic complications  Airway Mallampati: I  TM Distance: >3 FB Neck ROM: Full    Dental  (+) Edentulous Upper, Edentulous Lower   Pulmonary pneumonia, resolved, Patient abstained from smoking., former smoker   breath sounds clear to auscultation + decreased breath sounds      Cardiovascular negative cardio ROS  Rhythm:Regular Rate:Normal   1. Left ventricular ejection fraction, by estimation, is 70 to 75%. The  left ventricle has hyperdynamic function. Left ventricular endocardial  border not optimally defined to evaluate regional wall motion. Left  ventricular diastolic parameters are  indeterminate. Elevated left atrial pressure.   2. Right ventricular systolic function was not well visualized. The right  ventricular size is not well visualized.   3. A small pericardial effusion is present. The pericardial effusion is  circumferential. There is no evidence of cardiac tamponade.   4. The mitral valve is normal in structure. No evidence of mitral valve  regurgitation. No evidence of mitral stenosis.   5. Tricuspid valve regurgitation is mild to moderate.   6. The aortic valve is tricuspid. There is mild calcification of the  aortic valve. There is mild thickening of the aortic valve. Aortic valve  regurgitation is not visualized. No aortic stenosis is present.   7. The inferior vena cava is dilated in size with <50% respiratory  variability, suggesting right atrial pressure of 15 mmHg.     Neuro/Psych  Headaches PSYCHIATRIC DISORDERS Anxiety Depression Bipolar Disorder  Dementia Hx/o psychosis Neuromuscular disease CVA, No Residual Symptoms    GI/Hepatic Neg liver ROS,GERD   Medicated,,  Endo/Other   Hyperthyroidism   Renal/GU negative Renal ROSLab Results      Component                Value               Date                      CREATININE               0.63                07/31/2022                Musculoskeletal  (+) Arthritis , Osteoarthritis,  Necrotic wound right foot   Abdominal  (+)  Abdomen: soft. Bowel sounds: normal.  Peds  Hematology  (+) Blood dyscrasia, anemia Lab Results      Component                Value               Date                      WBC                      7.0                 08/01/2022                HGB                      10.4 (L)  08/01/2022                HCT                      33.8 (L)            08/01/2022                MCV                      92.6                08/01/2022                PLT                      157                 08/01/2022              Anesthesia Other Findings   Reproductive/Obstetrics                              Anesthesia Physical Anesthesia Plan  ASA: 3  Anesthesia Plan: MAC   Post-op Pain Management:    Induction: Intravenous  PONV Risk Score and Plan: 4 or greater and Treatment may vary due to age or medical condition and Ondansetron  Airway Management Planned: Natural Airway and Nasal Cannula  Additional Equipment: None  Intra-op Plan:   Post-operative Plan:   Informed Consent: I have reviewed the patients History and Physical, chart, labs and discussed the procedure including the risks, benefits and alternatives for the proposed anesthesia with the patient or authorized representative who has indicated his/her understanding and acceptance.     Dental advisory given  Plan Discussed with: Anesthesiologist and CRNA  Anesthesia Plan Comments:          Anesthesia Quick Evaluation

## 2022-08-03 NOTE — Transfer of Care (Signed)
Immediate Anesthesia Transfer of Care Note  Patient: [name]  Procedure(s) Performed: RIGHT FOOT DEBRIDEMENT (Right: Foot)  Patient Location: PACU  Anesthesia Type:MAC  Level of Consciousness: awake, alert , and patient cooperative  Airway & Oxygen Therapy: Patient Spontanous Breathing and Patient connected to face mask oxygen  Post-op Assessment: Report given to RN and Post -op Vital signs reviewed and stable  Post vital signs: Reviewed and stable  Last Vitals:  Vitals Value Taken Time  BP 109/76 08/03/22 1101  Temp    Pulse 75 08/03/22 1105  Resp 13 08/03/22 1105  SpO2 99 % 08/03/22 1105  Vitals shown include unfiled device data.  Last Pain:  Vitals:   08/03/22 0904  TempSrc: Oral  PainSc:          Complications: No notable events documented.

## 2022-08-03 NOTE — Op Note (Signed)
08/03/2022  11:13 AM  PATIENT:  Cassidy Smith    PRE-OPERATIVE DIAGNOSIS:  Right Foot Wound  POST-OPERATIVE DIAGNOSIS:  Same  PROCEDURE:  RIGHT FOOT DEBRIDEMENT  Excision of skin and soft tissue muscle fascia, tendon, and bone.  Application Kerecis tissue graft 38 cm for wound surface area of 36 cm.  Application cleanse choice wound VAC sponge x 1.  SURGEON:  [name], MD  PHYSICIAN ASSISTANT: Hart Carwin ANESTHESIA:   General  PREOPERATIVE INDICATIONS:  Cassidy Smith is a  67 y.o. female with a diagnosis of Right Foot Wound who failed conservative measures and elected for surgical management.    The risks benefits and alternatives were discussed with the patient preoperatively including but not limited to the risks of infection, bleeding, nerve injury, cardiopulmonary complications, the need for revision surgery, among others, and the patient was willing to proceed.  OPERATIVE IMPLANTS:   Implant Name Type Inv. Item Serial No. Manufacturer Lot No. LRB No. Used Action  GRAFT SKIN WND MICRO 38 - [id number] Tissue GRAFT SKIN WND MICRO 38  [employer] [id number] Right 1 Implanted    @ENCIMAGES @  OPERATIVE FINDINGS: Tissue margins were clear there was improved granulation tissue.  There was exposed bone and tendon that was debrided.  OPERATIVE PROCEDURE: Patient was brought the operating room and underwent a MAC anesthetic.  After adequate levels anesthesia were obtained patient's right lower extremity was prepped using DuraPrep draped into a sterile field a timeout was called.  A 21 blade knife was used to excise skin and soft tissue muscle fascia back to healthy viable margins.  There is exposed bone and tendon that was also removed with a rondure.  The wound was irrigated with normal saline.  Electrocautery was used for hemostasis.  There was good petechial bleeding.  This was a significant improvement from her initial debridement.  The 38 cm Kerecis micro graft was  applied to the wound surface area of 36 cm.  This was covered with a cleanse choice wound VAC sponge derma tack and Coban.  This had a good suction fit patient was taken the PACU in stable condition.   DISCHARGE PLANNING:  Antibiotic duration: Continue antibiotics for 24 hours  Weightbearing: Touchdown weightbearing on the right  Pain medication: Continue opioid pathway  Dressing care/ Wound VAC: Continue wound VAC for 1 week  Ambulatory devices: Walker or crutches  Discharge to: Anticipate discharge to skilled nursing.  Follow-up: In the office 1 week post operative.

## 2022-08-03 NOTE — Progress Notes (Signed)
Pharmacy Antibiotic Note  Cassidy Smith is a 67 y.o. female admitted on 07/31/2022 with  amputation with contaminated margins .  Pharmacy has been consulted for vancomycin dosing.  Topical vanc powder 1000mg  was used in OR 7/17.   Plan: Vancomycin 1000mg  IV x1 followed by vancomycin 1000mg  IV q 24 hours (eAUC 500)  - goal AUC 400-600  - No level at this time, renal function stable, and likely to discontinue within 24 hours per postop note.  Ceftriaxone per MD  Height: 5\' 5"  (165.1 cm) Weight: 70.3 kg (155 lb) IBW/kg (Calculated) : 57  Temp (24hrs), Avg:97.9 F (36.6 C), Min:97.6 F (36.4 C), Max:98.1 F (36.7 C)  Recent Labs  Lab 07/31/22 0839 08/01/22 0121  WBC 9.3 7.0  CREATININE 0.63  --     Estimated Creatinine Clearance: 65.3 mL/min (by C-G formula based on SCr of 0.63 mg/dL).    No Known Allergies  Antimicrobials this admission: Vanc 7/17> Ceftriaxone 7/17>  Metronidazole 7/17>  Microbiology results: 7/17 Wound culture: no growth 7/19 MRSA nasal PCR: negative  Thank you for allowing pharmacy to be a part of this patient's care.  Lora Paula, PharmD PGY2 Infectious Diseases Pharmacy Resident 08/03/2022 12:06 PM

## 2022-08-03 NOTE — Interval H&P Note (Signed)
History and Physical Interval Note:  08/03/2022 7:38 AM  Cassidy Smith  has presented today for surgery, with the diagnosis of Right Foot Wound.  The various methods of treatment have been discussed with the patient and family. After consideration of risks, benefits and other options for treatment, the patient has consented to  Procedure(s): RIGHT FOOT DEBRIDEMENT VS. BELOW KNEE AMPUTATION (Right) as a surgical intervention.  The patient's history has been reviewed, patient examined, no change in status, stable for surgery.  I have reviewed the patient's chart and labs.  Questions were answered to the patient's satisfaction.     [name]

## 2022-08-03 NOTE — Anesthesia Postprocedure Evaluation (Signed)
Anesthesia Post Note  Patient: Cassidy Smith  Procedure(s) Performed: RIGHT FOOT DEBRIDEMENT (Right: Foot)     Patient location during evaluation: PACU Anesthesia Type: MAC Level of consciousness: awake and alert Pain management: pain level controlled Vital Signs Assessment: post-procedure vital signs reviewed and stable Respiratory status: spontaneous breathing, nonlabored ventilation and respiratory function stable Cardiovascular status: stable and blood pressure returned to baseline Postop Assessment: no apparent nausea or vomiting Anesthetic complications: no   No notable events documented.  Last Vitals:  Vitals:   08/03/22 1115 08/03/22 1159  BP: 104/82 129/77  Pulse: 79 76  Resp: 17 17  Temp: 36.7 C 36.4 C  SpO2: 95% 94%    Last Pain:  Vitals:   08/03/22 1159  TempSrc: Oral  PainSc:                  Kejon Feild

## 2022-08-04 LAB — GLUCOSE, CAPILLARY: Glucose-Capillary: 86 mg/dL (ref 70–99)

## 2022-08-04 LAB — CREATININE, SERUM
Creatinine, Ser: 0.56 mg/dL (ref 0.44–1.00)
GFR, Estimated: >60 mL/min (ref >60)

## 2022-08-04 MED ORDER — SODIUM CHLORIDE 0.9 % IV SOLN
2.0000 g | INTRAVENOUS | Status: DC
  Administered 2022-08-04: 2 g via INTRAVENOUS
  Filled 2022-08-04: qty 20

## 2022-08-04 MED ORDER — SODIUM CHLORIDE 0.9 % IV SOLN
2.0000 g | Freq: Three times a day (TID) | INTRAVENOUS | Status: DC
  Filled 2022-08-04: qty 12.5

## 2022-08-04 NOTE — Evaluation (Addendum)
Physical Therapy Re-Evaluation and Treatment Patient Details Name: Cassidy Smith MRN: [id number] DOB: 1955-06-20 Today's Date: 08/04/2022  History of Present Illness  67 year old woman who is seen in follow-up for complex ankle fracture with soft tissue injury, currently has large necrotic ulcer on dorsum of R foot. S/p I&D 7/17. RLE TWB with post op shoe. Return to OR 7/19 PMH: Bipolar 1, ischemic stroke, intracerebral hematoma, neuromuscular disorder,  Clinical Impression  Pt supine in bed with complaints of R foot pain asking if shoe could be adjusted. PT removed blanket to find R post op shoe on RLE had ridden up her calf and was keeping her foot in extreme plantarflexion. PT removed and pt felt some relief. Informed RN that post op shoe only needs to be on foot with out of bed.  Pt reports she does not want to get up to chair, but is agreeable to bed level exercise. Per CM/SW note family is not interested in SNF level care, and will work on ramp so pt can be at a wheelchair level at home. PT will continue to follow acutely.       Assistance Recommended at Discharge Frequent or constant Supervision/Assistance  If plan is discharge home, recommend the following:  Can travel by private vehicle  A lot of help with walking and/or transfers;A lot of help with bathing/dressing/bathroom;Assistance with cooking/housework;Direct supervision/assist for medications management;Direct supervision/assist for financial management;Assist for transportation;Help with stairs or ramp for entrance   No    Equipment Recommendations None recommended by PT  Recommendations for Other Services  OT consult    Functional Status Assessment Patient has had a recent decline in their functional status and demonstrates the ability to make significant improvements in function in a reasonable and predictable amount of time.     Precautions / Restrictions Precautions Precautions: Fall Precaution Comments: hx of  falling Required Braces or Orthoses: Other Brace Other Brace: post op shoe RLE for out of bed Restrictions Weight Bearing Restrictions: Yes RLE Weight Bearing: Weight bearing as tolerated Other Position/Activity Restrictions: Wound Vac      Mobility  Bed Mobility               General bed mobility comments: pt requests not to get up today secondary to R LE pain          Balance Overall balance assessment: Needs assistance Sitting-balance support: Feet supported, No upper extremity supported Sitting balance-Leahy Scale: Fair Sitting balance - Comments: sitting EOB   Standing balance support: Bilateral upper extremity supported, Reliant on assistive device for balance, During functional activity Standing balance-Leahy Scale: Poor Standing balance comment: BUE support on RW                             Pertinent Vitals/Pain Pain Assessment Pain Assessment: Faces Faces Pain Scale: Hurts even more Breathing: normal Negative Vocalization: none Facial Expression: smiling or inexpressive Body Language: tense, distressed pacing, fidgeting Consolability: no need to console PAINAD Score: 1 Pain Location: R foot and ankle pt asked to have shoe adjusted, when R foot uncovered pt found to have post op shoe ment to be used for out of bed mobility was strapped to her foot and digging into the back of her calf Pain Descriptors / Indicators: Grimacing, Guarding    Home Living Family/patient expects to be discharged to:: Private residence Living Arrangements: Other relatives (brothers) Available Help at Discharge: Family;Available 24 hours/day Type of Home: House Home  Access: Stairs to enter Entrance Stairs-Rails: Right Entrance Stairs-Number of Steps: 1 (1 step outside and 3 steps inside)   Home Layout: Able to live on main level with bedroom/bathroom Home Equipment: Rollator (4 wheels) Additional Comments: poor historian, no family present to verify    Prior  Function Prior Level of Function : Needs assist       Physical Assist : Mobility (physical)     Mobility Comments: uses rollator ADLs Comments: independent with ADLs, brothers assist with iADLs. Has PCA from 8am to 9pm daily per chart review.     Hand Dominance   Dominant Hand: Right    Extremity/Trunk Assessment   Upper Extremity Assessment Upper Extremity Assessment: Generalized weakness    Lower Extremity Assessment Lower Extremity Assessment: RLE deficits/detail;LLE deficits/detail RLE Deficits / Details: hip ROM WFL, lacking full knee ext/flex, and dorsiflexion, strength in hip and knee grossly 2+/5 from longterm decreased mobility, ataxic movement RLE Sensation: WNL LLE Deficits / Details: ROM WFL, strength grossly 3/5, ataxic movement LLE Coordination: decreased fine motor;decreased gross motor    Cervical / Trunk Assessment Cervical / Trunk Assessment: Kyphotic  Communication   Communication: Expressive difficulties  Cognition Arousal/Alertness: Awake/alert Behavior During Therapy: WFL for tasks assessed/performed Overall Cognitive Status: No family/caregiver present to determine baseline cognitive functioning                                 General Comments: Oriented to self, situation and place. Disoriented to time. Pt following simple 1 step commands with increased time. Able to recall RLE WB precautions.        General Comments General comments (skin integrity, edema, etc.): VSS on RA    Exercises General Exercises - Lower Extremity Ankle Circles/Pumps: AROM, Right, 5 reps, Left, 10 reps, Supine Heel Slides: AROM, Both, 10 reps, Supine Straight Leg Raises: AROM, Both, 10 reps, Supine   Assessment/Plan    PT Assessment Patient needs continued PT services  PT Problem List Decreased strength;Decreased range of motion;Decreased activity tolerance;Decreased balance;Decreased mobility;Decreased coordination;Decreased cognition;Decreased skin  integrity;Pain       PT Treatment Interventions DME instruction;Gait training;Functional mobility training;Therapeutic activities;Therapeutic exercise;Balance training;Cognitive remediation;Patient/family education    PT Goals (Current goals can be found in the Care Plan section)  Acute Rehab PT Goals Patient Stated Goal: see her Bulldog PT Goal Formulation: With patient Time For Goal Achievement: 08/15/22 Potential to Achieve Goals: Fair    Frequency Min 4X/week        AM-PAC PT "6 Clicks" Mobility  Outcome Measure Help needed turning from your back to your side while in a flat bed without using bedrails?: None Help needed moving from lying on your back to sitting on the side of a flat bed without using bedrails?: A Little Help needed moving to and from a bed to a chair (including a wheelchair)?: Total Help needed standing up from a chair using your arms (e.g., wheelchair or bedside chair)?: Total Help needed to walk in hospital room?: Total Help needed climbing 3-5 steps with a railing? : Total 6 Click Score: 11    End of Session Equipment Utilized During Treatment: Gait belt Activity Tolerance: Patient limited by pain Patient left: with call bell/phone within reach;in bed;with bed alarm set Nurse Communication: Mobility status PT Visit Diagnosis: Unsteadiness on feet (R26.81);Other abnormalities of gait and mobility (R26.89);Muscle weakness (generalized) (M62.81);Ataxic gait (R26.0);History of falling (Z91.81);Difficulty in walking, not elsewhere classified (R26.2);Pain Pain - Right/Left:  Right Pain - part of body: Ankle and joints of foot    Time: 2956-2130 PT Time Calculation (min) (ACUTE ONLY): 21 min   Charges:   PT Evaluation $PT Re-evaluation: 1 Re-eval   PT General Charges $$ ACUTE PT VISIT: 1 Visit         Dwayna Kentner B. Beverely Risen PT, DPT Acute Rehabilitation Services Please use secure chat or  Call Office [phone]   Elon Alas  [facility] 08/04/2022, 3:34 PM

## 2022-08-04 NOTE — Progress Notes (Signed)
Pharmacy Antibiotic Note  Cassidy Smith is a 67 y.o. female admitted on 07/31/2022 with  osteomyelitis .  Pharmacy has been consulted for Ceftriaxone dosing. Discussed culture results with Dr. Christell Constant. Will switch to ceftriaxone based on Providencia rettgeri intermediate to cefepime, susceptible to ceftriaxone.  Plan: Will order ceftriaxone 2g daily  Height: 5\' 5"  (165.1 cm) Weight: 70.3 kg (155 lb) IBW/kg (Calculated) : 57  Temp (24hrs), Avg:97.8 F (36.6 C), Min:97.6 F (36.4 C), Max:98.2 F (36.8 C)  Recent Labs  Lab 07/31/22 0839 08/01/22 0121 08/04/22 1128  WBC 9.3 7.0  --   CREATININE 0.63  --  0.56    Estimated Creatinine Clearance: 65.3 mL/min (by C-G formula based on SCr of 0.56 mg/dL).    No Known Allergies  Antimicrobials this admission: Vanc 7/17>7/21 Cefepime 7/21 (ordered but not given) Fagyl 7/17>> Ceftriaxone 7/17>  Microbiology results: 7/17 Tissue culture:    Thank you for allowing pharmacy to be a part of this patient's care.  Dalene Carrow 08/04/2022 3:43 PM

## 2022-08-04 NOTE — Progress Notes (Signed)
Orthopedic progress note  No acute events overnight.  Pain well-controlled.  Patient wants to go back to sleep.  Patient awakes to voice, follows commands, answers questions appropriately.  Wound VAC in place over the right foot with good suction and no evidence of leak.  Able to weakly dorsiflex and plantarflex the toes.  Foot warm and well-perfused.  Plan: -TTWB on RLE -Pain control -Keep wound VAC on at all times -Antibiotics for 24 hours postop -PT evaluate and treat -Anticipate discharge to SNF  London Sheer, MD Orthopedic Surgeon

## 2022-08-04 NOTE — Plan of Care (Signed)
  Problem: Clinical Measurements: Goal: Postoperative complications will be avoided or minimized Outcome: Progressing   Problem: Self-Care: Goal: Ability to meet self-care needs will improve Outcome: Progressing   Problem: Self-Concept: Goal: Ability to maintain and perform role responsibilities to the fullest extent possible will improve Outcome: Progressing   Problem: Pain Management: Goal: Pain level will decrease with appropriate interventions Outcome: Progressing   Problem: Pain Management: Goal: Pain level will decrease with appropriate interventions Outcome: Progressing   Problem: Skin Integrity: Goal: Demonstration of wound healing without infection will improve Outcome: Progressing

## 2022-08-05 ENCOUNTER — Encounter (HOSPITAL_COMMUNITY): Payer: Self-pay | Admitting: Orthopedic Surgery

## 2022-08-05 LAB — AEROBIC/ANAEROBIC CULTURE W GRAM STAIN (SURGICAL/DEEP WOUND)

## 2022-08-05 NOTE — Progress Notes (Signed)
Occupational Therapy Treatment Patient Details Name: Cassidy Smith MRN: [id number] DOB: 11/12/1955 Today's Date: 08/05/2022   History of present illness 67 year old woman who is seen in follow-up for complex ankle fracture with soft tissue injury, currently has large necrotic ulcer on dorsum of R foot. S/p I&D 7/17. RLE TWB with post op shoe. Return to OR 7/19 PMH: Bipolar 1, ischemic stroke, intracerebral hematoma, neuromuscular disorder,   OT comments  Pt. Seen for skilled OT treatment session.  Pt. Able to complete bed mobility with heavy use of b ues on bed rails.  Max encouragement for use and weight bearing through bles and pt. Would not. S for lb dressing bed level brings bles towards chest to reach feet.  Used bue to lift herself to scoot towards hob.  In preparation for laying back down.    Recommendations for follow up therapy are one component of a multi-disciplinary discharge planning process, led by the attending physician.  Recommendations may be updated based on patient status, additional functional criteria and insurance authorization.    Assistance Recommended at Discharge Frequent or constant Supervision/Assistance  Patient can return home with the following  A little help with walking and/or transfers;A lot of help with bathing/dressing/bathroom;Assistance with cooking/housework;Direct supervision/assist for medications management;Direct supervision/assist for financial management;Assist for transportation;Help with stairs or ramp for entrance   Equipment Recommendations       Recommendations for Other Services      Precautions / Restrictions Precautions Precautions: Fall Precaution Comments: hx of falling Other Brace: post op shoe RLE for out of bed Restrictions Weight Bearing Restrictions: Yes RLE Weight Bearing: Weight bearing as tolerated Other Position/Activity Restrictions: Wound Vac       Mobility Bed Mobility Overal bed mobility: Needs Assistance Bed  Mobility: Supine to Sit     Supine to sit: Min guard     General bed mobility comments: able to sit in un supported sitting but prefers to hold onto bed rails and food board, able to initiate scoots towards hob with cues but would not weight bear throught bles. keeping legs hoovering and heavy reliance on bues to push and lifting her body off of the bed without use of her feet.  able to roll l/r without assistance for cleaning and bed pan placement at end of session    Transfers                   General transfer comment: pt. able to scoot x3 towards hob in preparation for laying back down. max cues for sequencing and rec. tech. pt. cont. to use heavy reliance of bues and does not utilize bles even with verbal and tactile encouragement     Balance                                           ADL either performed or assessed with clinical judgement   ADL Overall ADL's : Needs assistance/impaired Lb dressing: S bed level pt. Lays on back and brings each leg to her chest to reach B feet                           Toilet Transfer Details (indicate cue type and reason): pt. found incontinent of bowel upon arrival. attempting to wipe/clean herself with kleenex.  assisted with pt. clean up rolling L/R also with wiping/washing her  R hand that had been compromised in her efforts for clean up Toileting- Clothing Manipulation and Hygiene: Bed level;Maximal assistance;Total assistance Toileting - Clothing Manipulation Details (indicate cue type and reason): required clean up bed level of bowel            Extremity/Trunk Assessment              Vision       Perception     Praxis      Cognition Arousal/Alertness: Awake/alert Behavior During Therapy: WFL for tasks assessed/performed Overall Cognitive Status: No family/caregiver present to determine baseline cognitive functioning                                          Exercises       Shoulder Instructions       General Comments      Pertinent Vitals/ Pain       Pain Assessment Pain Assessment: No/denies pain  Home Living                                          Prior Functioning/Environment              Frequency  Min 2X/week        Progress Toward Goals  OT Goals(current goals can now be found in the care plan section)  Progress towards OT goals: Progressing toward goals     Plan Discharge plan remains appropriate    Co-evaluation                 AM-PAC OT "6 Clicks" Daily Activity     Outcome Measure   Help from another person eating meals?: None Help from another person taking care of personal grooming?: A Little Help from another person toileting, which includes using toliet, bedpan, or urinal?: A Little Help from another person bathing (including washing, rinsing, drying)?: A Lot Help from another person to put on and taking off regular upper body clothing?: A Little Help from another person to put on and taking off regular lower body clothing?: A Lot 6 Click Score: 17    End of Session    OT Visit Diagnosis: Unsteadiness on feet (R26.81);Muscle weakness (generalized) (M62.81);History of falling (Z91.81)   Activity Tolerance Patient tolerated treatment well   Patient Left in bed;with call bell/phone within reach   Nurse Communication Other (comment) (spoke with cna and rn, updated pt. incontient of bowel and assisted with clean up, pt. on bed pan at end of session with call bell and inst. to call for assistance. also that pt. thinks cna is of danger to her. rn plans to assist pt. off of bedpan)        Time: 0981-1914 OT Time Calculation (min): 11 min  Charges: OT General Charges $OT Visit: 1 Visit OT Treatments $Self Care/Home Management : 8-22 mins  Boneta Lucks, COTA/L Acute Rehabilitation [phone]   Alessandra Bevels Lorraine-COTA/L 08/05/2022, 10:49 AM

## 2022-08-05 NOTE — Care Management Important Message (Signed)
Important Message  Patient Details  Name: Cassidy Smith MRN: [id number] Date of Birth: 20-Dec-1955   Medicare Important Message Given:  Yes     Sherilyn Banker 08/05/2022, 1:10 PM

## 2022-08-05 NOTE — Progress Notes (Signed)
Physical Therapy Treatment Patient Details Name: Cassidy Smith MRN: [id number] DOB: August 12, 1955 Today's Date: 08/05/2022   History of Present Illness 67 year old woman who is seen in follow-up for complex ankle fracture with soft tissue injury, currently has large necrotic ulcer on dorsum of R foot. S/p I&D 7/17. RLE TWB with post op shoe. Return to OR 7/19 PMH: Bipolar 1, ischemic stroke, intracerebral hematoma, neuromuscular disorder,    PT Comments  Focused session on transfer and w/c mobility training. Pt needed modA to transfer to stand to a RW, but was unable to pivot with the RW. She was able to perform squat pivot transfer to/from a w/c with modA though. Pt also demonstrated good understanding of how to propel a w/c when cued. Will continue to follow acutely.    Assistance Recommended at Discharge Frequent or constant Supervision/Assistance  If plan is discharge home, recommend the following:  Can travel by private vehicle    A lot of help with walking and/or transfers;A lot of help with bathing/dressing/bathroom;Assistance with cooking/housework;Direct supervision/assist for medications management;Direct supervision/assist for financial management;Assist for transportation;Help with stairs or ramp for entrance   Yes  Equipment Recommendations  Rolling walker (2 wheels);BSC/3in1    Recommendations for Other Services OT consult     Precautions / Restrictions Precautions Precautions: Fall Precaution Comments: hx of falling; wound vac Required Braces or Orthoses: Other Brace Other Brace: post op shoe RLE for out of bed Restrictions Weight Bearing Restrictions: Yes RLE Weight Bearing: Touchdown weight bearing Other Position/Activity Restrictions: Wound Vac     Mobility  Bed Mobility Overal bed mobility: Needs Assistance Bed Mobility: Supine to Sit, Sit to Supine     Supine to sit: Min guard, HOB elevated Sit to supine: Min guard, HOB elevated   General bed mobility  comments: Extra time, min guard for safety. Pt utilizing bed rails.    Transfers Overall transfer level: Needs assistance Equipment used: 1 person hand held assist, Rolling walker (2 wheels) Transfers: Sit to/from Stand, Bed to chair/wheelchair/BSC Sit to Stand: Mod assist     Squat pivot transfers: Mod assist     General transfer comment: Pt needing repeated cues to reach proximal arm to surface she is transferring towards instead of twisting to reach with the distal arm. Pt can be quick to sit and needed modA to clear her buttocks and maintain her balance performing squat pivot transfers to L bed > w/c 2x and then to R w/c > bed 2x. Stood fully 1x from EOB to RW with modA, but unable to hop to step to w/c.    Ambulation/Gait               General Gait Details: unable   Psychologist, counselling mobility: Yes Wheelchair propulsion: Both upper extremities, Left lower extremity Wheelchair parts: Needs assistance Distance: 100 Wheelchair Assistance Details (indicate cue type and reason): Demonstrated and educated pt on proper propulsion pattern for efficiency and how to turn tightly in w/c. Good carryover noted, but pt moving slowly.   Tilt Bed    Modified Rankin (Stroke Patients Only)       Balance Overall balance assessment: Needs assistance Sitting-balance support: Feet supported, No upper extremity supported Sitting balance-Leahy Scale: Fair Sitting balance - Comments: sitting EOB   Standing balance support: Bilateral upper extremity supported, Reliant on assistive device for balance, During functional activity Standing balance-Leahy Scale: Poor Standing balance comment: BUE support  on RW and modA                            Cognition Arousal/Alertness: Awake/alert Behavior During Therapy: WFL for tasks assessed/performed Overall Cognitive Status: No family/caregiver present to determine baseline  cognitive functioning                                 General Comments: Pt following simple 1 step commands with increased time. Needs reminders for safe hand placement with transfers        Exercises General Exercises - Lower Extremity Ankle Circles/Pumps: AROM, Right, Supine, 5 reps    General Comments General comments (skin integrity, edema, etc.): brother notified by phone of pt's WB precautions and need for assistance with transfers; provided pt with gait belt      Pertinent Vitals/Pain Pain Assessment Pain Assessment: Faces Faces Pain Scale: Hurts little more Pain Location: R foot and ankle Pain Descriptors / Indicators: Grimacing, Guarding Pain Intervention(s): Monitored during session, Limited activity within patient's tolerance, Repositioned    Home Living                          Prior Function            PT Goals (current goals can now be found in the care plan section) Acute Rehab PT Goals Patient Stated Goal: go home PT Goal Formulation: With patient/family Time For Goal Achievement: 08/15/22 Potential to Achieve Goals: Fair Progress towards PT goals: Progressing toward goals    Frequency    Min 4X/week      PT Plan Equipment recommendations need to be updated    Co-evaluation              AM-PAC PT "6 Clicks" Mobility   Outcome Measure  Help needed turning from your back to your side while in a flat bed without using bedrails?: None Help needed moving from lying on your back to sitting on the side of a flat bed without using bedrails?: A Little Help needed moving to and from a bed to a chair (including a wheelchair)?: A Lot Help needed standing up from a chair using your arms (e.g., wheelchair or bedside chair)?: A Lot Help needed to walk in hospital room?: Total Help needed climbing 3-5 steps with a railing? : Total 6 Click Score: 13    End of Session Equipment Utilized During Treatment: Gait belt Activity  Tolerance: Patient tolerated treatment well Patient left: with call bell/phone within reach;in bed;with bed alarm set Nurse Communication: Mobility status PT Visit Diagnosis: Unsteadiness on feet (R26.81);Other abnormalities of gait and mobility (R26.89);Muscle weakness (generalized) (M62.81);Ataxic gait (R26.0);History of falling (Z91.81);Difficulty in walking, not elsewhere classified (R26.2);Pain Pain - Right/Left: Right Pain - part of body: Ankle and joints of foot     Time: 4742-5956 PT Time Calculation (min) (ACUTE ONLY): 50 min  Charges:    $Therapeutic Activity: 23-37 mins $Wheel Chair Management: 8-22 mins PT General Charges $$ ACUTE PT VISIT: 1 Visit                     Raymond Gurney, PT, DPT Acute Rehabilitation Services  Office: [phone]    Cassidy Smith 08/05/2022, 5:46 PM

## 2022-08-05 NOTE — TOC Transition Note (Addendum)
Transition of Care [facility]) - CM/SW Discharge Note   Patient Details  Name: Cassidy Smith MRN: [id number] Date of Birth: 1955-12-09  Transition of Care [facility]) CM/SW Contact:  Epifanio Lesches, RN Phone Number: 08/05/2022, 11:04 AM   Clinical Narrative:    Patient will DC to: home Anticipated DC date: 08/05/2022 Family notified: yes Transport by: car  - S/P R FOOT DEBRIDEMENT  Per MD patient ready for DC today. RN, patient, and patient's family (brother/son) notified of DC. Family and friend to assist with care once d/c. Pt agreeable to home health services per therapy recommendation. Pt without provider preference. Referral made with [facility] and accepted pending MD orders. Pt without DME needs, already has RW, W/C and BSC @ home. Brother to provide transportation to home. Pt without RX med concerns. Post hospital f/u noted on AVS.   08/05/2022  @ 11:45am NCM already active with [facility]. [facility] will resume home health services once d/c, pt/family made aware and ok with resumption.  RNCM will sign off for now as intervention is no longer needed. Please consult [place] again if new needs arise.    Final next level of care: Home w Home Health Services Barriers to Discharge: No Barriers Identified   Patient Goals and CMS Choice   Choice offered to / list presented to : Adult Children (son Timothy Lasso)  Discharge Placement                         Discharge Plan and Services Additional resources added to the After Visit Summary for   In-house Referral: Clinical Social Work   Post Acute Care Choice: Home Health                    HH Arranged: PT, OT [facility] Agency: [facility] Date [facility] Agency Contacted: 08/05/22 Time HH Agency Contacted: 1103 Representative spoke with at [facility] Agency: Tresa Endo  Social Determinants of Health (SDOH) Interventions SDOH Screenings   Food Insecurity: No Food Insecurity (07/31/2022)  Housing: Low Risk  (07/31/2022)   Transportation Needs: No Transportation Needs (07/31/2022)  Utilities: Not At Risk (07/31/2022)  Tobacco Use: Medium Risk (08/03/2022)     Readmission Risk Interventions    07/26/2020   12:20 PM  Readmission Risk Prevention Plan  Transportation Screening Complete  Home Care Screening Complete  Medication Review (RN CM) Complete

## 2022-08-05 NOTE — Discharge Summary (Signed)
Discharge Diagnoses:  Principal Problem:   Abscess of right foot Active Problems:   Cutaneous abscess of right foot   Surgeries: Procedure(s): RIGHT FOOT DEBRIDEMENT on 08/03/2022    Consultants:   Discharged Condition: Improved  Hospital Course: Cassidy Smith is an 68 y.o. female who was admitted 07/31/2022 with a chief complaint of right foot wound, with a final diagnosis of Right Foot Wound.  Patient was brought to the operating room on 08/03/2022 and underwent Procedure(s): RIGHT FOOT DEBRIDEMENT.    Patient was given perioperative antibiotics:  Anti-infectives (From admission, onward)    Start     Dose/Rate Route Frequency Ordered Stop   08/04/22 1630  cefTRIAXone (ROCEPHIN) 2 g in sodium chloride 0.9 % 100 mL IVPB        2 g 200 mL/hr over 30 Minutes Intravenous Every 24 hours 08/04/22 1541     08/04/22 1230  ceFEPIme (MAXIPIME) 2 g in sodium chloride 0.9 % 100 mL IVPB  Status:  Discontinued        2 g 200 mL/hr over 30 Minutes Intravenous Every 8 hours 08/04/22 1125 08/04/22 1541   08/03/22 1800  ceFAZolin (ANCEF) IVPB 2g/100 mL premix  Status:  Discontinued        2 g 200 mL/hr over 30 Minutes Intravenous Every 8 hours 08/03/22 1134 08/03/22 1342   08/03/22 1000  ceFAZolin (ANCEF) IVPB 2g/100 mL premix  Status:  Discontinued        2 g 200 mL/hr over 30 Minutes Intravenous On call to O.R. 08/03/22 0911 08/03/22 0937   08/03/22 1000  ceFAZolin (ANCEF) IVPB 2g/100 mL premix        2 g 200 mL/hr over 30 Minutes Intravenous On call to O.R. 08/03/22 0911 08/03/22 1107   08/03/22 0914  ceFAZolin (ANCEF) 2-4 GM/100ML-% IVPB       Note to Pharmacy: Aquilla Hacker M: cabinet override      08/03/22 0914 08/03/22 1041   08/01/22 1200  vancomycin (VANCOCIN) IVPB 1000 mg/200 mL premix  Status:  Discontinued        1,000 mg 200 mL/hr over 60 Minutes Intravenous Every 24 hours 07/31/22 1305 08/04/22 1124   07/31/22 1400  metroNIDAZOLE (FLAGYL) tablet 500 mg        500 mg Oral  Every 8 hours 07/31/22 1220 08/07/22 1359   07/31/22 1400  vancomycin (VANCOCIN) IVPB 1000 mg/200 mL premix        1,000 mg 200 mL/hr over 60 Minutes Intravenous  Once 07/31/22 1303 07/31/22 1652   07/31/22 1230  cefTRIAXone (ROCEPHIN) 2 g in sodium chloride 0.9 % 100 mL IVPB  Status:  Discontinued        2 g 200 mL/hr over 30 Minutes Intravenous Every 24 hours 07/31/22 1220 08/04/22 1125   07/31/22 1005  vancomycin (VANCOCIN) powder  Status:  Discontinued          As needed 07/31/22 1023 07/31/22 1023   07/31/22 0815  ceFAZolin (ANCEF) IVPB 2g/100 mL premix        2 g 200 mL/hr over 30 Minutes Intravenous On call to O.R. 07/31/22 0804 07/31/22 1022     .  Patient was given sequential compression devices, early ambulation, and aspirin for DVT prophylaxis.  Recent vital signs: Patient Vitals for the past 24 hrs:  BP Temp Temp src Pulse Resp SpO2  08/05/22 0735 125/85 98.5 F (36.9 C) Oral 75 18 99 %  08/05/22 0407 138/84 98.6 F (37 C) -- 70 17  100 %  08/04/22 2011 121/89 98 F (36.7 C) -- -- 17 98 %  08/04/22 1551 115/63 97.6 F (36.4 C) Oral 72 18 94 %  .  Recent laboratory studies: No results found.  Discharge Medications:   Allergies as of 08/05/2022   No Known Allergies      Medication List     TAKE these medications    acetaminophen-codeine 300-30 MG tablet Commonly known as: TYLENOL #3 Take 1 tablet by mouth every 6 (six) hours as needed for moderate pain.   aspirin EC 81 MG tablet Take 1 tablet (81 mg total) by mouth daily with breakfast. Swallow whole.   atorvastatin 20 MG tablet Commonly known as: LIPITOR Take 1 tablet (20 mg total) by mouth daily at 6 PM.   B-12 5000 MCG Caps Take 5,000 mcg by mouth daily.   benztropine 1 MG tablet Commonly known as: COGENTIN Take 1 tablet (1 mg total) by mouth 2 (two) times daily.   Calmoseptine 0.44-20.6 % Oint Generic drug: Menthol-Zinc Oxide Apply 1 Application topically daily as needed (bed sores).    divalproex 500 MG 24 hr tablet Commonly known as: DEPAKOTE ER Take 2 tablets (1,000 mg total) by mouth at bedtime.   furosemide 20 MG tablet Commonly known as: LASIX Take 20 mg by mouth daily.   gabapentin 100 MG capsule Commonly known as: NEURONTIN Take 1 capsule (100 mg total) by mouth 3 (three) times daily. What changed:  how much to take when to take this additional instructions   ipratropium-albuterol 0.5-2.5 (3) MG/3ML Soln Commonly known as: DUONEB Take 3 mLs by nebulization every 6 (six) hours as needed.   methimazole 10 MG tablet Commonly known as: TAPAZOLE Take 10 mg by mouth every other day.   multivitamin with minerals Tabs tablet Take 1 tablet by mouth daily. Centrum Silver   pantoprazole 40 MG tablet Commonly known as: PROTONIX Take 40 mg by mouth daily.   potassium chloride 10 MEQ tablet Commonly known as: KLOR-CON Take 10 mEq by mouth daily.   traZODone 100 MG tablet Commonly known as: DESYREL Take 100 mg by mouth at bedtime.   Vitamin D3 50 MCG (2000 UT) capsule Take 2,000 Units by mouth daily.        Diagnostic Studies: No results found.  Patient benefited maximally from their hospital stay and there were no complications.     Disposition: Discharge disposition: 01-Home or Self Care      Discharge Instructions     AMB Referral to Community Care Coordinaton (ACO Patients)   Complete by: As directed    Hospital Follow up referral/Care Coordination:  readmission prevention follow up  Primary Care Provider:  Toma Deiters, MD  Insurance plan:  Medicare ACO REACH  Please assign to RN Care Coordinator for post hospital care coordination for readmission prevention follow up calls and assess for further needs.  Questions please call:   Charlesetta Shanks, RN BSN CCM [facility]  [phone] business mobile phone Toll free office [phone]  *Concierge Line   [phone] Fax number: [phone] [email or web address]   Reason for Referral: Care Coordination (ACO patients)   Disease managment services needed: Nurse Case Manager   Diagnoses of: Other   Other Diagnosis: empheyma, ankle fracture, high fall risk, AWV 2 yrs overdue   Expected date of contact: Urgent - 7 Days   Call MD / Call 911   Complete by: As directed    If you experience chest  pain or shortness of breath, CALL 911 and be transported to the hospital emergency room.  If you develope a fever above 101 F, pus (white drainage) or increased drainage or redness at the wound, or calf pain, call your surgeon's office.   Constipation Prevention   Complete by: As directed    Drink plenty of fluids.  Prune juice may be helpful.  You may use a stool softener, such as Colace (over the counter) 100 mg twice a day.  Use MiraLax (over the counter) for constipation as needed.   Diet - low sodium heart healthy   Complete by: As directed    Increase activity slowly as tolerated   Complete by: As directed    Negative Pressure Wound Therapy - Incisional   Complete by: As directed    Attached the wound VAC dressing to the Praveena plus portable wound VAC pump and show patient how to plug this and and keep it charged.   Post-operative opioid taper instructions:   Complete by: As directed    POST-OPERATIVE OPIOID TAPER INSTRUCTIONS: It is important to wean off of your opioid medication as soon as possible. If you do not need pain medication after your surgery it is ok to stop day one. Opioids include: Codeine, Hydrocodone(Norco, Vicodin), Oxycodone(Percocet, oxycontin) and hydromorphone amongst others.  Long term and even short term use of opiods can cause: Increased pain response Dependence Constipation Depression Respiratory depression And more.  Withdrawal symptoms can include Flu like symptoms Nausea, vomiting And more Techniques to manage these  symptoms Hydrate well Eat regular healthy meals Stay active Use relaxation techniques(deep breathing, meditating, yoga) Do Not substitute Alcohol to help with tapering If you have been on opioids for less than two weeks and do not have pain than it is ok to stop all together.  Plan to wean off of opioids This plan should start within one week post op of your joint replacement. Maintain the same interval or time between taking each dose and first decrease the dose.  Cut the total daily intake of opioids by one tablet each day Next start to increase the time between doses. The last dose that should be eliminated is the evening dose.          Follow-up Information     [name], MD Follow up in 1 week(s).   Specialty: Orthopedic Surgery Contact information: [address] [place] [address] [phone]                  Signed: [name] 08/05/2022, 8:15 AM

## 2022-08-05 NOTE — Plan of Care (Signed)
  Problem: Self-Care: Goal: Ability to meet self-care needs will improve Outcome: Progressing   Problem: Skin Integrity: Goal: Demonstration of wound healing without infection will improve Outcome: Progressing   Problem: Health Behavior/Discharge Planning: Goal: Ability to manage health-related needs will improve Outcome: Progressing

## 2022-08-05 NOTE — Progress Notes (Signed)
    Durable Medical Equipment  (From admission, onward)           Start     Ordered   08/05/22 1052  For home use only DME Bedside commode  Once       Comments: RIGHT FOOT DEBRIDEMENT. Confine to room.  Question:  Patient needs a bedside commode to treat with the following condition  Answer:  S/P debridement   08/05/22 1055

## 2022-08-05 NOTE — Discharge Summary (Signed)
Discharge Diagnoses:  Principal Problem:   Abscess of right foot Active Problems:   Cutaneous abscess of right foot   Surgeries: Procedure(s): RIGHT FOOT DEBRIDEMENT on 08/03/2022    Consultants:   Discharged Condition: Improved  Hospital Course: Cassidy Smith is an 67 y.o. female who was admitted 07/31/2022 with a chief complaint of right foot wound, with a final diagnosis of Right Foot Wound.  Patient was brought to the operating room on 08/03/2022 and underwent Procedure(s): RIGHT FOOT DEBRIDEMENT.    Patient was given perioperative antibiotics:  Anti-infectives (From admission, onward)    Start     Dose/Rate Route Frequency Ordered Stop   08/04/22 1630  cefTRIAXone (ROCEPHIN) 2 g in sodium chloride 0.9 % 100 mL IVPB        2 g 200 mL/hr over 30 Minutes Intravenous Every 24 hours 08/04/22 1541     08/04/22 1230  ceFEPIme (MAXIPIME) 2 g in sodium chloride 0.9 % 100 mL IVPB  Status:  Discontinued        2 g 200 mL/hr over 30 Minutes Intravenous Every 8 hours 08/04/22 1125 08/04/22 1541   08/03/22 1800  ceFAZolin (ANCEF) IVPB 2g/100 mL premix  Status:  Discontinued        2 g 200 mL/hr over 30 Minutes Intravenous Every 8 hours 08/03/22 1134 08/03/22 1342   08/03/22 1000  ceFAZolin (ANCEF) IVPB 2g/100 mL premix  Status:  Discontinued        2 g 200 mL/hr over 30 Minutes Intravenous On call to O.R. 08/03/22 0911 08/03/22 0937   08/03/22 1000  ceFAZolin (ANCEF) IVPB 2g/100 mL premix        2 g 200 mL/hr over 30 Minutes Intravenous On call to O.R. 08/03/22 0911 08/03/22 1107   08/03/22 0914  ceFAZolin (ANCEF) 2-4 GM/100ML-% IVPB       Note to Pharmacy: Aquilla Hacker M: cabinet override      08/03/22 0914 08/03/22 1041   08/01/22 1200  vancomycin (VANCOCIN) IVPB 1000 mg/200 mL premix  Status:  Discontinued        1,000 mg 200 mL/hr over 60 Minutes Intravenous Every 24 hours 07/31/22 1305 08/04/22 1124   07/31/22 1400  metroNIDAZOLE (FLAGYL) tablet 500 mg        500 mg Oral  Every 8 hours 07/31/22 1220 08/07/22 1359   07/31/22 1400  vancomycin (VANCOCIN) IVPB 1000 mg/200 mL premix        1,000 mg 200 mL/hr over 60 Minutes Intravenous  Once 07/31/22 1303 07/31/22 1652   07/31/22 1230  cefTRIAXone (ROCEPHIN) 2 g in sodium chloride 0.9 % 100 mL IVPB  Status:  Discontinued        2 g 200 mL/hr over 30 Minutes Intravenous Every 24 hours 07/31/22 1220 08/04/22 1125   07/31/22 1005  vancomycin (VANCOCIN) powder  Status:  Discontinued          As needed 07/31/22 1023 07/31/22 1023   07/31/22 0815  ceFAZolin (ANCEF) IVPB 2g/100 mL premix        2 g 200 mL/hr over 30 Minutes Intravenous On call to O.R. 07/31/22 0804 07/31/22 1022     .  Patient was given sequential compression devices, early ambulation, and aspirin for DVT prophylaxis.  Recent vital signs: Patient Vitals for the past 24 hrs:  BP Temp Temp src Pulse Resp SpO2  08/05/22 0735 125/85 98.5 F (36.9 C) Oral 75 18 99 %  08/05/22 0407 138/84 98.6 F (37 C) -- 70 17  100 %  08/04/22 2011 121/89 98 F (36.7 C) -- -- 17 98 %  08/04/22 1551 115/63 97.6 F (36.4 C) Oral 72 18 94 %  .  Recent laboratory studies: No results found.  Discharge Medications:   Allergies as of 08/05/2022   No Known Allergies      Medication List     TAKE these medications    acetaminophen-codeine 300-30 MG tablet Commonly known as: TYLENOL #3 Take 1 tablet by mouth every 6 (six) hours as needed for moderate pain.   aspirin EC 81 MG tablet Take 1 tablet (81 mg total) by mouth daily with breakfast. Swallow whole.   atorvastatin 20 MG tablet Commonly known as: LIPITOR Take 1 tablet (20 mg total) by mouth daily at 6 PM.   B-12 5000 MCG Caps Take 5,000 mcg by mouth daily.   benztropine 1 MG tablet Commonly known as: COGENTIN Take 1 tablet (1 mg total) by mouth 2 (two) times daily.   Calmoseptine 0.44-20.6 % Oint Generic drug: Menthol-Zinc Oxide Apply 1 Application topically daily as needed (bed sores).    divalproex 500 MG 24 hr tablet Commonly known as: DEPAKOTE ER Take 2 tablets (1,000 mg total) by mouth at bedtime.   furosemide 20 MG tablet Commonly known as: LASIX Take 20 mg by mouth daily.   gabapentin 100 MG capsule Commonly known as: NEURONTIN Take 1 capsule (100 mg total) by mouth 3 (three) times daily. What changed:  how much to take when to take this additional instructions   ipratropium-albuterol 0.5-2.5 (3) MG/3ML Soln Commonly known as: DUONEB Take 3 mLs by nebulization every 6 (six) hours as needed.   methimazole 10 MG tablet Commonly known as: TAPAZOLE Take 10 mg by mouth every other day.   multivitamin with minerals Tabs tablet Take 1 tablet by mouth daily. Centrum Silver   pantoprazole 40 MG tablet Commonly known as: PROTONIX Take 40 mg by mouth daily.   potassium chloride 10 MEQ tablet Commonly known as: KLOR-CON Take 10 mEq by mouth daily.   traZODone 100 MG tablet Commonly known as: DESYREL Take 100 mg by mouth at bedtime.   Vitamin D3 50 MCG (2000 UT) capsule Take 2,000 Units by mouth daily.               Durable Medical Equipment  (From admission, onward)           Start     Ordered   08/05/22 1052  For home use only DME Bedside commode  Once       Comments: RIGHT FOOT DEBRIDEMENT. Confine to room.  Question:  Patient needs a bedside commode to treat with the following condition  Answer:  S/P debridement   08/05/22 1055            Diagnostic Studies: No results found.  Patient benefited maximally from their hospital stay and there were no complications.     Disposition: Discharge disposition: 01-Home or Self Care      Discharge Instructions     AMB Referral to Community Care Coordinaton ([facility]O Patients)   Complete by: As directed    Hospital Follow up referral/Care Coordination:  readmission prevention follow up  Primary Care Provider:  Toma Deiters, MD  Insurance plan:  Medicare ACO REACH  Please assign  to RN Care Coordinator for post hospital care coordination for readmission prevention follow up calls and assess for further needs.  Questions please call:   Charlesetta Shanks, RN BSN CCM [facility]  [phone] business mobile phone Toll free office [phone]  *Concierge Line  [phone] Fax number: [phone] [email or web address]   Reason for Referral: Care Coordination ([facility] patients)   Disease managment services needed: Nurse Case Manager   Diagnoses of: Other   Other Diagnosis: empheyma, ankle fracture, high fall risk, AWV 2 yrs overdue   Expected date of contact: Urgent - 7 Days   Call MD / Call 911   Complete by: As directed    If you experience chest pain or shortness of breath, CALL 911 and be transported to the hospital emergency room.  If you develope a fever above 101 F, pus (white drainage) or increased drainage or redness at the wound, or calf pain, call your surgeon's office.   Call MD / Call 911   Complete by: As directed    If you experience chest pain or shortness of breath, CALL 911 and be transported to the hospital emergency room.  If you develope a fever above 101 F, pus (white drainage) or increased drainage or redness at the wound, or calf pain, call your surgeon's office.   Constipation Prevention   Complete by: As directed    Drink plenty of fluids.  Prune juice may be helpful.  You may use a stool softener, such as Colace (over the counter) 100 mg twice a day.  Use MiraLax (over the counter) for constipation as needed.   Constipation Prevention   Complete by: As directed    Drink plenty of fluids.  Prune juice may be helpful.  You may use a stool softener, such as Colace (over the counter) 100 mg twice a day.  Use MiraLax (over the counter) for constipation as needed.   Diet - low sodium heart healthy   Complete by: As directed    Diet - low sodium heart  healthy   Complete by: As directed    Increase activity slowly as tolerated   Complete by: As directed    Increase activity slowly as tolerated   Complete by: As directed    Negative Pressure Wound Therapy - Incisional   Complete by: As directed    Attached the wound VAC dressing to the Praveena plus portable wound VAC pump and show patient how to plug this and and keep it charged.   Post-operative opioid taper instructions:   Complete by: As directed    POST-OPERATIVE OPIOID TAPER INSTRUCTIONS: It is important to wean off of your opioid medication as soon as possible. If you do not need pain medication after your surgery it is ok to stop day one. Opioids include: Codeine, Hydrocodone(Norco, Vicodin), Oxycodone(Percocet, oxycontin) and hydromorphone amongst others.  Long term and even short term use of opiods can cause: Increased pain response Dependence Constipation Depression Respiratory depression And more.  Withdrawal symptoms can include Flu like symptoms Nausea, vomiting And more Techniques to manage these symptoms Hydrate well Eat regular healthy meals Stay active Use relaxation techniques(deep breathing, meditating, yoga) Do Not substitute Alcohol to help with tapering If you have been on opioids for less than two weeks and do not have pain than it is ok to stop all together.  Plan to wean off of opioids This plan should start within one week post op of your joint replacement. Maintain the same interval or time between taking each dose and first decrease the dose.  Cut the total daily intake of opioids by one tablet each day Next start to increase the time between doses.  The last dose that should be eliminated is the evening dose.      Post-operative opioid taper instructions:   Complete by: As directed    POST-OPERATIVE OPIOID TAPER INSTRUCTIONS: It is important to wean off of your opioid medication as soon as possible. If you do not need pain medication after  your surgery it is ok to stop day one. Opioids include: Codeine, Hydrocodone(Norco, Vicodin), Oxycodone(Percocet, oxycontin) and hydromorphone amongst others.  Long term and even short term use of opiods can cause: Increased pain response Dependence Constipation Depression Respiratory depression And more.  Withdrawal symptoms can include Flu like symptoms Nausea, vomiting And more Techniques to manage these symptoms Hydrate well Eat regular healthy meals Stay active Use relaxation techniques(deep breathing, meditating, yoga) Do Not substitute Alcohol to help with tapering If you have been on opioids for less than two weeks and do not have pain than it is ok to stop all together.  Plan to wean off of opioids This plan should start within one week post op of your joint replacement. Maintain the same interval or time between taking each dose and first decrease the dose.  Cut the total daily intake of opioids by one tablet each day Next start to increase the time between doses. The last dose that should be eliminated is the evening dose.          Follow-up Information     [name], MD Follow up in 1 week(s).   Specialty: Orthopedic Surgery Contact information: [address] [place] [address] [phone]         Toma Deiters, MD Follow up.   Specialty: Internal Medicine Contact information: [address] [place] [address] [phone]         [facility] Follow up.   Why: Resumption of home health services will be provided by [facility]                 Signed: [name] 08/05/2022, 11:46 AM

## 2022-08-05 NOTE — Progress Notes (Signed)
Patient ID: Cassidy Smith, female   DOB: 12/22/1955, 67 y.o.   MRN: [id number] Patient comfortable this morning without complaints.  25 cc in the wound VAC canister with a good suction fit.  Plan for discharge to home today with the Praveena plus portable wound VAC pump.  Patient requests no pain medicine prescriptions.  Patient does not want to discharge to skilled nursing.

## 2022-08-06 ENCOUNTER — Telehealth: Payer: Self-pay | Admitting: *Deleted

## 2022-08-06 NOTE — Progress Notes (Unsigned)
  Care Coordination  Outreach Note  08/06/2022 Name: Cassidy Smith MRN: [id number] DOB: 09/27/1955   Care Coordination Outreach Attempts: An unsuccessful telephone outreach was attempted today to offer the patient information about available care coordination services.  Follow Up Plan:  Additional outreach attempts will be made to offer the patient care coordination information and services.   Encounter Outcome:  No Answer  Burman Nieves, CCMA Care Coordination Care Guide Direct Dial: [phone]

## 2022-08-07 NOTE — Progress Notes (Signed)
  Care Coordination   Note   08/07/2022 Name: Cassidy Smith MRN: [id number] DOB: 1955-04-28  [name] is a 67 y.o. year old female who sees Hasanaj, Myra Gianotti, MD for primary care. I reached out to [name] by phone today to offer care coordination services.  [name] was given information about Care Coordination services today including:   The Care Coordination services include support from the care team which includes your Nurse Coordinator, Clinical Social Worker, or Pharmacist.  The Care Coordination team is here to help remove barriers to the health concerns and goals most important to you. Care Coordination services are voluntary, and the patient may decline or stop services at any time by request to their care team member.   Care Coordination Consent Status: Patient agreed to services and verbal consent obtained.   Follow up plan:  Telephone appointment with care coordination team member scheduled for:  08/19/2022  Encounter Outcome:  Pt. Scheduled from referral   Burman Nieves, [facility] Care Coordination Care Guide Direct Dial: [phone]

## 2022-08-13 ENCOUNTER — Ambulatory Visit: Payer: Medicare Other | Admitting: Orthopedic Surgery

## 2022-08-13 ENCOUNTER — Encounter: Payer: Self-pay | Admitting: Orthopedic Surgery

## 2022-08-13 ENCOUNTER — Telehealth: Payer: Self-pay | Admitting: Orthopedic Surgery

## 2022-08-13 DIAGNOSIS — L89896 Pressure-induced deep tissue damage of other site: Secondary | ICD-10-CM

## 2022-08-13 MED ORDER — HYDROCODONE-ACETAMINOPHEN 5-325 MG PO TABS: 1.0000 | ORAL_TABLET | Freq: Four times a day (QID) | ORAL | 0 refills | Status: DC | PRN

## 2022-08-13 NOTE — Telephone Encounter (Signed)
Received call from [place] (PT) with [facility] needing clarification on weight bearing status on right foot. Verlon Au asked if patient can have a HH nurse come back into the home? The number to contact Verlon Au is [phone]

## 2022-08-13 NOTE — Progress Notes (Signed)
Office Visit Note   Patient: Cassidy Smith           Date of Birth: 08-26-1955           MRN: [id number] Visit Date: 08/13/2022              Requested by: Toma Deiters, MD 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  Assessment & Plan: Visit Diagnoses:  1. Pressure injury of deep tissue of dorsum of right foot     Plan: Patient will start Dial soap cleansing.  The center 3 cm shows unincorporated Kerecis tissue.  The surrounding edges have excellent incorporation.  Prescription provided for Vicodin.  Follow-Up Instructions: Return in about 2 weeks (around 08/27/2022).   Ortho Exam  Patient is alert, oriented, no adenopathy, well-dressed, normal affect, normal respiratory effort. Examination the wound measures 5 x 7 cm with healthy domes of granulation tissue circumferentially.  The center 3 cm in diameter has unincorporated Kerecis tissue graft.  Will proceed with wound care.  Patient may need additional in office treatments.  Imaging: No results found. No images are attached to the encounter.  Labs: Lab Results  Component Value Date   HGBA1C 5.0 06/10/2017   HGBA1C 5.4 05/13/2013   ESRSEDRATE 2 05/13/2013   LABURIC 2.4 04/11/2017   REPTSTATUS 08/05/2022 FINAL 07/31/2022   GRAMSTAIN  07/31/2022    RARE WBC PRESENT,BOTH PMN AND MONONUCLEAR NO ORGANISMS SEEN    CULT  07/31/2022    MODERATE PROTEUS VULGARIS MODERATE PROVIDENCIA RETTGERI MODERATE CORYNEBACTERIUM STRIATUM Standardized susceptibility testing for this organism is not available. NO ANAEROBES ISOLATED Performed at [facility], [address] [place], [place] [address]    LABORGA PROVIDENCIA RETTGERI 07/31/2022    LABORGA PROTEUS VULGARIS 07/31/2022     Lab Results  Component Value Date   ALBUMIN 2.0 (L) 02/21/2022   ALBUMIN 3.2 (L) 08/12/2020   ALBUMIN 3.3 (L) 07/25/2020    Lab Results  Component Value Date   MG 1.9 02/24/2022   MG 1.5 (L) 02/23/2022   MG 1.6 (L) 02/22/2022   Lab Results  Component Value Date   VD25OH 49.2 03/06/2017    No results found for: "PREALBUMIN"    Latest Ref Rng & Units 08/01/2022    1:21 AM 07/31/2022    8:39 AM 04/11/2022   12:56 PM  CBC EXTENDED  WBC 4.0 - 10.5 K/uL 7.0  9.3  10.8   RBC 3.87 - 5.11 MIL/uL 3.65  4.08  3.93   Hemoglobin 12.0 - 15.0 g/dL 29.5  28.4  13.2   HCT 36.0 - 46.0 % 33.8  38.1  36.7   Platelets 150 - 400 K/uL 157  160  228   NEUT# 1.7 - 7.7 K/uL   5.7   Lymph# 0.7 - 4.0 K/uL   3.6      There is no height or weight on file to calculate BMI.  Orders:  No orders of the defined types were placed in this encounter.  Meds ordered this encounter  Medications   HYDROcodone-acetaminophen (NORCO/VICODIN) 5-325 MG tablet    Sig: Take 1 tablet by mouth every 6 (six) hours as needed  for moderate pain.    Dispense:  30 tablet    Refill:  0     Procedures: No procedures performed  Clinical Data: No additional findings.  ROS:  All other systems negative, except as noted in the HPI. Review of Systems  Objective: Vital Signs: There were no vitals taken for this visit.  Specialty Comments:  No specialty comments available.  PMFS History: Patient Active Problem List   Diagnosis Date Noted   Cutaneous abscess of right foot 07/31/2022   Abscess of right foot 07/31/2022   Acute heart failure with preserved ejection fraction (HFpEF) (HCC) 02/20/2022   Acute respiratory failure with hypoxia (HCC) 02/20/2022   AKI (acute kidney injury) (HCC) 02/15/2022   Aspiration pneumonia (HCC) 02/15/2022   Leukocytosis 02/15/2022   Falls frequently 02/15/2022   SIRS (systemic inflammatory response syndrome) (HCC) 08/12/2020   Mild  protein malnutrition (HCC) 08/12/2020   Sepsis (HCC) 07/25/2020   Sepsis with acute hypoxic respiratory failure without septic shock (HCC)    Dementia without behavioral disturbance (HCC)    Palliative care by specialist    Goals of care, counseling/discussion    Pneumonia of right lower lobe due to infectious organism 11/06/2019   Intracerebral hematoma (HCC) 07/04/2019   Closed head injury 07/03/2019   Thrombocytopenia (HCC) 07/03/2019   Thyrotoxicosis, unspecified with thyrotoxic crisis or storm 01/15/2019   History of syndrome of inappropriate antidiuretic hormone (SIADH) 01/15/2019   UTI (urinary tract infection) 01/04/2018   Neuromuscular disorder (HCC)    Dysuria 10/30/2017   Fever 08/15/2017   Hypokalemia 08/15/2017   Altered mental state 08/15/2017   Acute metabolic encephalopathy 08/15/2017   Valproic acid toxicity 08/15/2017   Rectal cancer (HCC) 07/10/2017   Chronic deep vein thrombosis (DVT) of distal vein of lower extremity (HCC) 07/10/2017   Altered mental status    Ischemic stroke (HCC)    GERD (gastroesophageal reflux disease) 05/12/2017   GI bleed    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   Dysplastic rectal polyp    Protein-calorie malnutrition, severe 04/07/2017   Pressure injury of skin 04/04/2017   Anticoagulated 04/03/2017   Stool bloody 04/03/2017   Current every day smoker 04/03/2017   Rectal bleeding    Chronic diarrhea    Hyperthyroidism    S/P ORIF (open reduction internal fixation) fracture right hip IM nail 03/07/17 03/24/2017   Hypomagnesemia 03/07/2017   Fall    Closed intertrochanteric fracture of hip, right, initial encounter (HCC) 03/06/2017   Radial styloid fracture: right 03/06/2017   Normocytic anemia 03/06/2017   Orthostatic hypotension 06/07/2015   Lower urinary tract infectious disease 06/07/2015   Rhabdomyolysis 06/07/2015   White matter abnormality on MRI of brain 05/14/2013   History of depression  05/14/2013   Hx of anxiety disorder 05/14/2013   Bipolar I disorder, most recent episode (or current) manic (HCC) 05/14/2013   Bipolar 1 disorder (HCC) 05/14/2013   Hyponatremia 05/12/2013   Lacunar infarct, acute (HCC) 05/12/2013   Past Medical History:  Diagnosis Date   Anxiety    Arthritis    Bipolar 1 disorder (HCC)    Chronic deep vein thrombosis (DVT) of distal vein of lower extremity (HCC) 07/10/2017   Chronic hip pain    Depression    GI bleed    Headache(784.0)    History of UTI/bladder spams.    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   Thyrotoxicosis, unspecified with thyrotoxic crisis or storm 01/15/2019    Family History  Problem Relation Age of Onset   Breast cancer Mother    Cancer - Other Mother    Alcoholism Father    Colon cancer Neg Hx    Colon polyps Neg Hx     Past Surgical History:  Procedure Laterality Date   COLONOSCOPY WITH PROPOFOL N/A 04/05/2017   Procedure: COLONOSCOPY WITH PROPOFOL;  Surgeon: Malissa Hippo, MD;  Location: [facility];  Service: Endoscopy;  Laterality: N/A;   I & D EXTREMITY Right 07/31/2022   Procedure: RIGHT FOOT DEBRIDEMENT AND TISSUE GRAFT;  Surgeon: [name], MD;  Location: [facility] OR;  Service: Orthopedics;  Laterality: Right;   I & D EXTREMITY Right 08/03/2022   Procedure: RIGHT FOOT DEBRIDEMENT;  Surgeon: [name], MD;  Location: [facility] OR;  Service: Orthopedics;  Laterality: Right;   INTRAMEDULLARY (IM) NAIL INTERTROCHANTERIC Right 03/07/2017   Procedure: OPEN TREATMENT INTERNAL FIXATION RIGHT HIP WITH GAMA INTRAMEDULARY NAIL;  Surgeon: Vickki Hearing, MD;   Location: [facility] ORS;  Service: Orthopedics;  Laterality: Right;   MULTIPLE EXTRACTIONS WITH ALVEOLOPLASTY N/A 10/30/2012   Procedure: MULTIPLE EXTRACION 5, 6, 8, 9, 10 ,18, 19, 31 WITH MAXILLARY RIGHT AND LEFT  ALVEOLOPLASTY REDUCE MAXILLARY LEFT TUBEROSITY;  Surgeon: Georgia Lopes, DDS;  Location: [facility] OR;  Service: Oral Surgery;  Laterality: N/A;   POLYPECTOMY  04/05/2017   Procedure: POLYPECTOMY;  Surgeon: Malissa Hippo, MD;  Location: [facility];  Service: Endoscopy;;  recto-sigmoid, rectum   Social History   Occupational History   Not on file  Tobacco Use   Smoking status: Former    Current packs/day: 0.00    Average packs/day: 0.5 packs/day for 20.0 years (10.0 ttl pk-yrs)    Types: Cigarettes    Start date: 05/05/1997    Quit date: 05/05/2017    Years since quitting: 5.2   Smokeless tobacco: Never   Tobacco comments:    patient states she quit one week ago  Vaping Use   Vaping status: Never Used  Substance and Sexual Activity   Alcohol use: No   Drug use: No   Sexual activity: Not on file

## 2022-08-14 NOTE — Telephone Encounter (Signed)
SW Verlon Au, advised pt is touchdown WTB and okay to have HHRN come out for eval.

## 2022-08-19 ENCOUNTER — Encounter: Payer: Self-pay | Admitting: *Deleted

## 2022-08-19 ENCOUNTER — Ambulatory Visit: Payer: Self-pay | Admitting: *Deleted

## 2022-08-19 NOTE — Patient Outreach (Signed)
Care Coordination   Initial Visit Note   08/19/2022 Name: Cassidy Smith MRN: [id number] DOB: 06-Oct-1955  Cassidy Smith is a 67 y.o. year old female who sees Hasanaj, Myra Gianotti, MD for primary care. I  spoke with patient's brother, [name], by telephone while she worked with HHPT.  What matters to the patients health and wellness today?  Right ankle fracture and wound healing    Goals Addressed             This Visit's Progress    Right Ankle Fracture and Wound Healing       Care Coordination Goals: Patient will keep all medical appointments Patient will take mediations as prescribed Patient/brother will change dressing on right ankle as instructed Cleaning with mild soap and water and applying a wet to dry dressing with saline Patient/brothers will monitor wound for signs and symptoms of infection and will seek medical attention as needed Patient will eat well balanced meals with lean protein, fruits, and vegetables with vitamin C to promote wound healing Patient will continue to work with HHPT on ROM and eventually begin bearing weight and walking after she is released to do so by orthopedic surgeon Patient/family will reach out to RN Care Coordinator at [phone] with any resource or care coordination needs        SDOH assessments and interventions completed:  Yes  SDOH Interventions Today    Flowsheet Row Most Recent Value  SDOH Interventions   Housing Interventions Intervention Not Indicated  Transportation Interventions Patient Resources (Friends/Family)  Financial Strain Interventions Intervention Not Indicated        Care Coordination Interventions:  Yes, provided  Interventions Today    Flowsheet Row Most Recent Value  Chronic Disease   Chronic disease during today's visit Other  [Closed fracture of right ankle s/p fall on 04/11/22]  General Interventions   General Interventions Discussed/Reviewed General Interventions Discussed, General Interventions  Reviewed, Doctor Visits, Durable Medical Equipment (DME)  Doctor Visits Discussed/Reviewed Doctor Visits Discussed, Doctor Visits Reviewed, PCP, Specialist  Durable Medical Equipment (DME) Dan Humphreys, Wheelchair  [Wheelchair used. Unable to ambulate at this time. Working with PT.]  Wheelchair Standard  PCP/Specialist Visits Compliance with follow-up visit  [Dr Lajoyce Corners on 08/27/22 at 1:00. Brother, [name], will provide transportation]  Exercise Interventions   Exercise Discussed/Reviewed Physical Activity  Physical Activity Discussed/Reviewed Physical Activity Discussed, Physical Activity Reviewed  [wheelchair bound. Has not been released by orthopedic surgeon to walk yet s/p closed fracture of right ankle and subsequent wound abscess of right ankle. Working with HHPT twice a week.]  Education Interventions   Education Provided Provided Education  Provided Engineer, petroleum On When to see the doctor, Nutrition, Other, Medication, Exercise  [wound care. Brother, [name], is cleaning wound daily and applying a wet to dry dressing. provided information on signs of infection and when to seek medical attention. provided education on the wound debridement that was done by orthopedic surgeon.]  Nutrition Interventions   Nutrition Discussed/Reviewed Nutrition Discussed, Nutrition Reviewed, Adding fruits and vegetables, Increasing proteins, Portion sizes, Fluid intake  [encouraged balanced meals with lean proteins, fruits, and vegetables. Vit C and protein will aid in wound and fracture healing.]  Pharmacy Interventions   Pharmacy Dicussed/Reviewed Pharmacy Topics Discussed, Pharmacy Topics Reviewed, Medications and their functions  [hydrocodone every 6 hrs as needed for pain control]  Safety Interventions   Safety Discussed/Reviewed Safety Discussed, Safety Reviewed, Fall Risk, Home Safety  Home Safety Assistive Devices       Follow  up plan: Follow up call scheduled for 09/03/22    Encounter Outcome:  Pt. Visit  Completed   Demetrios Loll, BSN, RN-BC RN Care Coordinator [facility]  [facility] Direct Dial: [phone] Main #: [phone]

## 2022-08-23 ENCOUNTER — Other Ambulatory Visit: Payer: Self-pay | Admitting: Orthopedic Surgery

## 2022-08-23 ENCOUNTER — Telehealth: Payer: Self-pay | Admitting: Orthopedic Surgery

## 2022-08-23 MED ORDER — DOXYCYCLINE HYCLATE 100 MG PO TABS: 100.0000 mg | ORAL_TABLET | Freq: Two times a day (BID) | ORAL | 0 refills | Status: DC

## 2022-08-23 NOTE — Telephone Encounter (Signed)
I called and sw pt's brother Cassidy Smith. He states that pharmacy just arrived with ABX. Advised to take as directed and keep appt for Tuesday. Voiced understanding and will call with questions.

## 2022-08-23 NOTE — Telephone Encounter (Signed)
I tried to contact pt for additional information and there is no answer. She is s/p a foot debridement end of last month and called stating that the foot looks "infected" I will continue to try and reach out to the pt but did not know if you wanted to call in prophylactic abx to get her until her appt on Tuesday?

## 2022-08-23 NOTE — Telephone Encounter (Signed)
I called the number left in this message but just advised that I was returning a call ( I do know who Lonia Skinner is and there is not an auth to talk with her?) I advised to call the office back. I tried the pt's phone number listed in chart with no answer. Will hold and try again.

## 2022-08-23 NOTE — Telephone Encounter (Signed)
Received call from [facility] she advised patient has an infection on top of her right foot. Sebeste asked if an antibiotic can be called into the pharmacy.  Patient uses Psychiatrist. The number to contact  Lonia Skinner is [phone]

## 2022-08-27 ENCOUNTER — Other Ambulatory Visit: Payer: Self-pay

## 2022-08-27 ENCOUNTER — Encounter: Payer: Self-pay | Admitting: Orthopedic Surgery

## 2022-08-27 ENCOUNTER — Encounter (HOSPITAL_COMMUNITY): Payer: Self-pay | Admitting: Orthopedic Surgery

## 2022-08-27 ENCOUNTER — Ambulatory Visit (INDEPENDENT_AMBULATORY_CARE_PROVIDER_SITE_OTHER): Payer: Medicare Other | Admitting: Orthopedic Surgery

## 2022-08-27 DIAGNOSIS — L89896 Pressure-induced deep tissue damage of other site: Secondary | ICD-10-CM

## 2022-08-27 DIAGNOSIS — M86271 Subacute osteomyelitis, right ankle and foot: Secondary | ICD-10-CM

## 2022-08-27 MED ORDER — HYDROCODONE-ACETAMINOPHEN 5-325 MG PO TABS: 1.0000 | ORAL_TABLET | Freq: Four times a day (QID) | ORAL | 0 refills | Status: DC | PRN

## 2022-08-27 NOTE — Progress Notes (Signed)
Office Visit Note   Patient: Cassidy Smith           Date of Birth: 1955-06-18           MRN: [id number] Visit Date: 08/27/2022              Requested by: Toma Deiters, MD [address] [place],  [place] [address] PCP: Toma Deiters, MD  Chief Complaint  Patient presents with   Right Foot - Routine Post Op    07/31/2022 right foot debridement  08/03/2022 right foot debridement and graft       HPI: Patient is a 67 year old woman who initially developed a pressure injury on the dorsum of her right foot from a cast for closed treatment of an ankle fracture.  Patient developed a large full-thickness ulcer underwent 2 surgical debridements in the hospital.  Patient presents at this time with healthy granulation tissue around the circumferential area of the bone however she has large area of necrotic bone in the central aspect.  She is currently on doxycycline.  Assessment & Plan: Visit Diagnoses:  1. Subacute osteomyelitis, right ankle and foot (HCC)   2. Pressure injury of deep tissue of dorsum of right foot     Plan: Discussed with the patient we could proceed with continued foot salvage intervention would plan for surgical excisional debridement of the bone across the midfoot on Wednesday with repeat debridement on Friday.  Discussed patient is at high risk of loss of her foot.  Follow-Up Instructions: Return in about 2 weeks (around 09/10/2022).   Ortho Exam  Patient is alert, oriented, no adenopathy, well-dressed, normal affect, normal respiratory effort.  Examination patient has necrotic tissue 2 x 2 cm in the mid aspect of the wound.  The total wound area is 5 x 6 cm with healthy granulation tissue around the wound edges.  A rondure was used to remove some of the nonviable bone.  There is no deep abscess.  She does have swelling.   Imaging: No results found.   Labs: Lab Results  Component Value Date   HGBA1C 5.0 06/10/2017   HGBA1C 5.4 05/13/2013   ESRSEDRATE 2  05/13/2013   LABURIC 2.4 04/11/2017   REPTSTATUS 08/05/2022 FINAL 07/31/2022   GRAMSTAIN  07/31/2022    RARE WBC PRESENT,BOTH PMN AND MONONUCLEAR NO ORGANISMS SEEN    CULT  07/31/2022    MODERATE PROTEUS VULGARIS MODERATE PROVIDENCIA RETTGERI MODERATE CORYNEBACTERIUM STRIATUM Standardized susceptibility testing for this organism is not available. NO ANAEROBES ISOLATED Performed at [facility], [address], [place], [place] [address]    Iran Sizer RETTGERI 07/31/2022   LABORGA PROTEUS VULGARIS 07/31/2022     Lab Results  Component Value Date   ALBUMIN 2.0 (L) 02/21/2022   ALBUMIN 3.2 (L) 08/12/2020   ALBUMIN 3.3 (L) 07/25/2020    Lab Results  Component Value Date   MG 1.9 02/24/2022   MG 1.5 (L) 02/23/2022   MG 1.6 (L) 02/22/2022   Lab Results  Component Value Date   VD25OH 49.2 03/06/2017    No results found for: "PREALBUMIN"    Latest Ref Rng & Units 08/01/2022    1:21 AM 07/31/2022    8:39 AM 04/11/2022   12:56 PM  CBC EXTENDED  WBC 4.0 - 10.5 K/uL 7.0  9.3  10.8   RBC 3.87 - 5.11 MIL/uL 3.65  4.08  3.93   Hemoglobin 12.0 - 15.0 g/dL 82.9  56.2  13.0  HCT 36.0 - 46.0 % 33.8  38.1  36.7   Platelets 150 - 400 K/uL 157  160  228   NEUT# 1.7 - 7.7 K/uL   5.7   Lymph# 0.7 - 4.0 K/uL   3.6      There is no height or weight on file to calculate BMI.  Orders:  No orders of the defined types were placed in this encounter.  No orders of the defined types were placed in this encounter.    Procedures: No procedures performed  Clinical Data: No additional findings.  ROS:  All other systems negative, except as noted in the HPI. Review of Systems  Objective: Vital Signs: There were no vitals taken for this visit.  Specialty Comments:  No specialty comments available.  PMFS History: Patient Active Problem List   Diagnosis Date Noted   Cutaneous abscess of right foot 07/31/2022   Abscess of right foot 07/31/2022   Acute heart  failure with preserved ejection fraction (HFpEF) (HCC) 02/20/2022   Acute respiratory failure with hypoxia (HCC) 02/20/2022   AKI (acute kidney injury) (HCC) 02/15/2022   Aspiration pneumonia (HCC) 02/15/2022   Leukocytosis 02/15/2022   Falls frequently 02/15/2022   SIRS (systemic inflammatory response syndrome) (HCC) 08/12/2020   Mild protein malnutrition (HCC) 08/12/2020   Sepsis (HCC) 07/25/2020   Sepsis with acute hypoxic respiratory failure without septic shock (HCC)    Dementia without behavioral disturbance (HCC)    Palliative care by specialist    Goals of care, counseling/discussion    Pneumonia of right lower lobe due to infectious organism 11/06/2019   Intracerebral hematoma (HCC) 07/04/2019   Closed head injury 07/03/2019   Thrombocytopenia (HCC) 07/03/2019   Thyrotoxicosis, unspecified with thyrotoxic crisis or storm 01/15/2019   History of syndrome of inappropriate antidiuretic hormone (SIADH) 01/15/2019   UTI (urinary tract infection) 01/04/2018   Neuromuscular disorder (HCC)    Dysuria 10/30/2017   Fever 08/15/2017   Hypokalemia 08/15/2017   Altered mental state 08/15/2017   Acute metabolic encephalopathy 08/15/2017   Valproic acid toxicity 08/15/2017   Rectal cancer (HCC) 07/10/2017   Chronic deep vein thrombosis (DVT) of distal vein of lower extremity (HCC) 07/10/2017   Altered mental status    Ischemic stroke (HCC)    GERD (gastroesophageal reflux disease) 05/12/2017   GI bleed    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   Dysplastic rectal polyp    Protein-calorie malnutrition, severe 04/07/2017   Pressure injury of skin 04/04/2017   Anticoagulated 04/03/2017   Stool bloody 04/03/2017   Current every day smoker 04/03/2017   Rectal bleeding    Chronic diarrhea    Hyperthyroidism    S/P ORIF (open reduction internal fixation) fracture right hip IM nail 03/07/17 03/24/2017   Hypomagnesemia 03/07/2017   Fall    Closed  intertrochanteric fracture of hip, right, initial encounter (HCC) 03/06/2017   Radial styloid fracture: right 03/06/2017   Normocytic anemia 03/06/2017   Orthostatic hypotension 06/07/2015   Lower urinary tract infectious disease 06/07/2015   Rhabdomyolysis 06/07/2015   White matter abnormality on MRI of brain 05/14/2013   History of depression 05/14/2013   Hx of anxiety disorder 05/14/2013   Bipolar I disorder, most recent episode (or current) manic (HCC) 05/14/2013   Bipolar 1 disorder (HCC) 05/14/2013   Hyponatremia 05/12/2013   Lacunar infarct, acute (HCC) 05/12/2013   Past Medical History:  Diagnosis Date   Anxiety    Arthritis    Bipolar 1 disorder (HCC)  Chronic deep vein thrombosis (DVT) of distal vein of lower extremity (HCC) 07/10/2017   Chronic hip pain    Depression    GI bleed    Headache(784.0)    History of UTI/bladder spams.    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   Thyrotoxicosis, unspecified with thyrotoxic crisis or storm 01/15/2019    Family History  Problem Relation Age of Onset   Breast cancer Mother    Cancer - Other Mother    Alcoholism Father    Colon cancer Neg Hx    Colon polyps Neg Hx     Past Surgical History:  Procedure Laterality Date   COLONOSCOPY WITH PROPOFOL N/A 04/05/2017   Procedure: COLONOSCOPY WITH PROPOFOL;  Surgeon: Malissa Hippo, MD;  Location: [facility] SUITE;  Service: Endoscopy;  Laterality: N/A;   I & D EXTREMITY Right 07/31/2022   Procedure: RIGHT FOOT DEBRIDEMENT AND TISSUE GRAFT;  Surgeon: [name], MD;  Location[facility] OR;  Service: Orthopedics;  Laterality: Right;    I & D EXTREMITY Right 08/03/2022   Procedure: RIGHT FOOT DEBRIDEMENT;  Surgeon: [name], MD;  Location: [facility] OR;  Service: Orthopedics;  Laterality: Right;   INTRAMEDULLARY (IM) NAIL INTERTROCHANTERIC Right 03/07/2017   Procedure: OPEN TREATMENT INTERNAL FIXATION RIGHT HIP WITH GAMA INTRAMEDULARY NAIL;  Surgeon: Vickki Hearing, MD;  Location: [facility] ORS;  Service: Orthopedics;  Laterality: Right;   MULTIPLE EXTRACTIONS WITH ALVEOLOPLASTY N/A 10/30/2012   Procedure: MULTIPLE EXTRACION 5, 6, 8, 9, 10 ,18, 19, 31 WITH MAXILLARY RIGHT AND LEFT  ALVEOLOPLASTY REDUCE MAXILLARY LEFT TUBEROSITY;  Surgeon: Georgia Lopes, DDS;  Location: [facility] OR;  Service: Oral Surgery;  Laterality: N/A;   POLYPECTOMY  04/05/2017   Procedure: POLYPECTOMY;  Surgeon: Malissa Hippo, MD;  Location: [facility];  Service: Endoscopy;;  recto-sigmoid, rectum   Social History   Occupational History   Not on file  Tobacco Use   Smoking status: Former    Current packs/day: 0.00    Average packs/day: 0.5 packs/day for 20.0 years (10.0 ttl pk-yrs)    Types: Cigarettes    Start date: 05/05/1997    Quit date: 05/05/2017    Years since quitting: 5.3   Smokeless tobacco: Never   Tobacco comments:    patient states she quit one week ago  Vaping Use   Vaping status: Never Used  Substance and Sexual Activity   Alcohol use: No   Drug use: No   Sexual activity: Not on file

## 2022-08-27 NOTE — Progress Notes (Signed)
SDW CALL  Patient was unable to come to phone per pt's brother<Steve.  Pre-op instructions given over the phone to patient's brother's, Kathlene November and [name]. Patient lives with her brother, Kathlene November. The opportunity was given for the brothers to ask questions. No further questions asked. They verbalized understanding of instructions given.   PCP - Lia Hopping MD Cardiologist - denies  PPM/ICD -  Device Orders -  Rep Notified -   Chest x-ray - 02/20/22 EKG - 08/06/22 Stress Test - none ECHO - 02/19/22 Cardiac Cath -   Sleep Study - none CPAP - no  Fasting Blood Sugar - na Checks Blood Sugar _____ times a day  Blood Thinner Instructions:na Aspirin Instructions:last dose 8/13  ERAS Protcol - clears until 1020 PRE-SURGERY Ensure or G2- no  COVID TEST- na   Anesthesia review: yes- pt on home O2  [name] and Kathlene November denied that the patient was having  shortness of breath, fever, cough and chest pain over the phone call    Surgical Instructions    Your procedure is scheduled on 8/13  Report to [facility] Main Entrance "A" at 1050 A.M., then check in with the Admitting office.  Call this number if you have problems the morning of surgery:  [phone]    Remember:  Do not eat after midnight the night before your surgery  You may drink clear liquids until 1020 the morning of your surgery.   Clear liquids allowed are: Water, Non-Citrus Juices (without pulp), Carbonated Beverages, Clear Tea, Black Coffee ONLY (NO MILK, CREAM OR POWDERED CREAMER of any kind), and Gatorade   Take these medicines the morning of surgery with A SIP OF WATER: Cogentin,Neurontin    As of today, STOP taking any Aspirin (unless otherwise instructed by your surgeon) Aleve, Naproxen, Ibuprofen, Motrin, Advil, Goody's, BC's, all herbal medications, fish oil, and all vitamins.  [facility] is not responsible for any belongings or valuables. .   Do NOT Smoke (Tobacco/Vaping)  24 hours prior to your procedure  If  you use a CPAP at night, you may bring your mask for your overnight stay.   Contacts, glasses, hearing aids, dentures or partials may not be worn into surgery, please bring cases for these belongings    Special instructions:    Oral Hygiene is also important to reduce your risk of infection.  Remember - BRUSH YOUR TEETH THE MORNING OF SURGERY WITH YOUR REGULAR TOOTHPASTE. Per pt's brother,Steve, pt has no teeth.   Day of Surgery:  Take a shower the day of or night before with antibacterial soap. Wear Clean/Comfortable clothing the morning of surgery Do not apply any deodorants/lotions.   Do not wear jewelry or makeup Do not wear lotions, powders, perfumes/colognes, or deodorant. Do not shave 48 hours prior to surgery.  Men may shave face and neck. Do not bring valuables to the hospital. Do not wear nail polish, gel polish, artificial nails, or any other type of covering on natural nails (fingers and toes) If you have artificial nails or gel coating that need to be removed by a nail salon, please have this removed prior to surgery. Artificial nails or gel coating may interfere with anesthesia's ability to adequately monitor your vital signs. Remember to brush your teeth WITH YOUR REGULAR TOOTHPASTE.

## 2022-08-28 ENCOUNTER — Inpatient Hospital Stay (HOSPITAL_COMMUNITY): Payer: Medicare Other | Admitting: Vascular Surgery

## 2022-08-28 ENCOUNTER — Inpatient Hospital Stay (HOSPITAL_COMMUNITY)
Admission: RE | Admit: 2022-08-28 | Discharge: 2022-08-31 | DRG: 464 | Disposition: A | Discharge status: Home / Self Care | Payer: Medicare Other | Attending: Orthopedic Surgery | Admitting: Orthopedic Surgery

## 2022-08-28 ENCOUNTER — Encounter (HOSPITAL_COMMUNITY)
Admission: RE | Disposition: A | Discharge status: Home / Self Care | Payer: Self-pay | Source: Home / Self Care | Attending: Orthopedic Surgery

## 2022-08-28 ENCOUNTER — Encounter (HOSPITAL_COMMUNITY): Payer: Self-pay | Admitting: Orthopedic Surgery

## 2022-08-28 DIAGNOSIS — E0590 Thyrotoxicosis, unspecified without thyrotoxic crisis or storm: Secondary | ICD-10-CM | POA: Diagnosis not present

## 2022-08-28 DIAGNOSIS — R636 Underweight: Secondary | ICD-10-CM | POA: Diagnosis present

## 2022-08-28 DIAGNOSIS — Z803 Family history of malignant neoplasm of breast: Secondary | ICD-10-CM | POA: Diagnosis not present

## 2022-08-28 DIAGNOSIS — Z86718 Personal history of other venous thrombosis and embolism: Secondary | ICD-10-CM | POA: Diagnosis not present

## 2022-08-28 DIAGNOSIS — F419 Anxiety disorder, unspecified: Secondary | ICD-10-CM | POA: Diagnosis present

## 2022-08-28 DIAGNOSIS — M86171 Other acute osteomyelitis, right ankle and foot: Principal | ICD-10-CM | POA: Diagnosis present

## 2022-08-28 DIAGNOSIS — Z87891 Personal history of nicotine dependence: Secondary | ICD-10-CM

## 2022-08-28 DIAGNOSIS — L89899 Pressure ulcer of other site, unspecified stage: Secondary | ICD-10-CM | POA: Diagnosis present

## 2022-08-28 DIAGNOSIS — M869 Osteomyelitis, unspecified: Secondary | ICD-10-CM

## 2022-08-28 DIAGNOSIS — I5031 Acute diastolic (congestive) heart failure: Secondary | ICD-10-CM

## 2022-08-28 DIAGNOSIS — Z681 Body mass index (BMI) 19 or less, adult: Secondary | ICD-10-CM

## 2022-08-28 DIAGNOSIS — K219 Gastro-esophageal reflux disease without esophagitis: Secondary | ICD-10-CM | POA: Diagnosis present

## 2022-08-28 DIAGNOSIS — M1990 Unspecified osteoarthritis, unspecified site: Secondary | ICD-10-CM | POA: Diagnosis present

## 2022-08-28 DIAGNOSIS — Z8673 Personal history of transient ischemic attack (TIA), and cerebral infarction without residual deficits: Secondary | ICD-10-CM | POA: Diagnosis not present

## 2022-08-28 DIAGNOSIS — E871 Hypo-osmolality and hyponatremia: Secondary | ICD-10-CM | POA: Diagnosis not present

## 2022-08-28 DIAGNOSIS — Z87440 Personal history of urinary (tract) infections: Secondary | ICD-10-CM | POA: Diagnosis not present

## 2022-08-28 DIAGNOSIS — Z811 Family history of alcohol abuse and dependence: Secondary | ICD-10-CM

## 2022-08-28 DIAGNOSIS — E039 Hypothyroidism, unspecified: Secondary | ICD-10-CM | POA: Diagnosis not present

## 2022-08-28 DIAGNOSIS — Z66 Do not resuscitate: Secondary | ICD-10-CM | POA: Diagnosis present

## 2022-08-28 DIAGNOSIS — F319 Bipolar disorder, unspecified: Secondary | ICD-10-CM | POA: Diagnosis present

## 2022-08-28 DIAGNOSIS — I679 Cerebrovascular disease, unspecified: Secondary | ICD-10-CM | POA: Diagnosis not present

## 2022-08-28 DIAGNOSIS — M879 Osteonecrosis, unspecified: Secondary | ICD-10-CM | POA: Diagnosis present

## 2022-08-28 HISTORY — PX: I & D EXTREMITY: SHX5045

## 2022-08-28 SURGERY — IRRIGATION AND DEBRIDEMENT EXTREMITY
Anesthesia: Monitor Anesthesia Care | Site: Foot | Laterality: Right

## 2022-08-28 MED ORDER — OXYCODONE HCL 5 MG PO TABS: 5.0000 mg | ORAL_TABLET | ORAL | Status: DC | PRN

## 2022-08-28 MED ORDER — JUVEN PO PACK
1.0000 | PACK | Freq: Two times a day (BID) | ORAL | Status: DC
  Administered 2022-08-28 – 2022-08-31 (×5): 1 via ORAL
  Filled 2022-08-28 (×5): qty 1

## 2022-08-28 MED ORDER — LABETALOL HCL 5 MG/ML IV SOLN: 10.0000 mg | INTRAVENOUS | Status: DC | PRN

## 2022-08-28 MED ORDER — DOCUSATE SODIUM 100 MG PO CAPS
100.0000 mg | ORAL_CAPSULE | Freq: Every day | ORAL | Status: DC
  Administered 2022-08-29: 100 mg via ORAL
  Filled 2022-08-28: qty 1

## 2022-08-28 MED ORDER — CEFAZOLIN SODIUM-DEXTROSE 2-4 GM/100ML-% IV SOLN
2.0000 g | INTRAVENOUS | Status: AC
  Administered 2022-08-28: 2 g via INTRAVENOUS
  Filled 2022-08-28: qty 100

## 2022-08-28 MED ORDER — ACETAMINOPHEN 10 MG/ML IV SOLN: 1000.0000 mg | Freq: Once | INTRAVENOUS | Status: DC | PRN

## 2022-08-28 MED ORDER — POLYETHYLENE GLYCOL 3350 17 G PO PACK: 17.0000 g | PACK | Freq: Every day | ORAL | Status: DC | PRN

## 2022-08-28 MED ORDER — MAGNESIUM CITRATE PO SOLN: 1.0000 | Freq: Once | ORAL | Status: DC | PRN

## 2022-08-28 MED ORDER — GUAIFENESIN-DM 100-10 MG/5ML PO SYRP: 15.0000 mL | ORAL_SOLUTION | ORAL | Status: DC | PRN

## 2022-08-28 MED ORDER — ALUM & MAG HYDROXIDE-SIMETH 200-200-20 MG/5ML PO SUSP: 15.0000 mL | ORAL | Status: DC | PRN

## 2022-08-28 MED ORDER — CEFAZOLIN SODIUM-DEXTROSE 2-4 GM/100ML-% IV SOLN
2.0000 g | Freq: Three times a day (TID) | INTRAVENOUS | Status: DC
  Administered 2022-08-28 – 2022-08-29 (×2): 2 g via INTRAVENOUS
  Filled 2022-08-28 (×2): qty 100

## 2022-08-28 MED ORDER — PROPOFOL 10 MG/ML IV BOLUS
INTRAVENOUS | Status: DC | PRN
Start: 2022-08-28 — End: 2022-08-28
  Administered 2022-08-28 (×2): 25 mg via INTRAVENOUS

## 2022-08-28 MED ORDER — 0.9 % SODIUM CHLORIDE (POUR BTL) OPTIME
TOPICAL | Status: DC | PRN
  Administered 2022-08-28: 1000 mL

## 2022-08-28 MED ORDER — DIVALPROEX SODIUM ER 500 MG PO TB24
1000.0000 mg | ORAL_TABLET | Freq: Every day | ORAL | Status: DC
  Administered 2022-08-28 – 2022-08-30 (×3): 1000 mg via ORAL
  Filled 2022-08-28 (×4): qty 2

## 2022-08-28 MED ORDER — ATORVASTATIN CALCIUM 10 MG PO TABS
20.0000 mg | ORAL_TABLET | Freq: Every day | ORAL | Status: DC
  Administered 2022-08-28 – 2022-08-30 (×3): 20 mg via ORAL
  Filled 2022-08-28 (×3): qty 2

## 2022-08-28 MED ORDER — VANCOMYCIN HCL 1000 MG IV SOLR
INTRAVENOUS | Status: AC
  Filled 2022-08-28: qty 20

## 2022-08-28 MED ORDER — HYDRALAZINE HCL 20 MG/ML IJ SOLN: 5.0000 mg | INTRAMUSCULAR | Status: DC | PRN

## 2022-08-28 MED ORDER — PROPOFOL 500 MG/50ML IV EMUL
INTRAVENOUS | Status: DC | PRN
  Administered 2022-08-28: 50 ug/kg/min via INTRAVENOUS

## 2022-08-28 MED ORDER — GABAPENTIN 100 MG PO CAPS
100.0000 mg | ORAL_CAPSULE | Freq: Three times a day (TID) | ORAL | Status: DC
  Administered 2022-08-28 – 2022-08-31 (×8): 100 mg via ORAL
  Filled 2022-08-28 (×8): qty 1

## 2022-08-28 MED ORDER — METOPROLOL TARTRATE 5 MG/5ML IV SOLN: 2.0000 mg | INTRAVENOUS | Status: DC | PRN

## 2022-08-28 MED ORDER — VITAMIN C 500 MG PO TABS
1000.0000 mg | ORAL_TABLET | Freq: Every day | ORAL | Status: DC
  Administered 2022-08-28 – 2022-08-31 (×3): 1000 mg via ORAL
  Filled 2022-08-28 (×3): qty 2

## 2022-08-28 MED ORDER — IPRATROPIUM-ALBUTEROL 0.5-2.5 (3) MG/3ML IN SOLN: 3.0000 mL | Freq: Four times a day (QID) | RESPIRATORY_TRACT | Status: DC | PRN

## 2022-08-28 MED ORDER — MAGNESIUM SULFATE 2 GM/50ML IV SOLN: 2.0000 g | Freq: Every day | INTRAVENOUS | Status: DC | PRN

## 2022-08-28 MED ORDER — VANCOMYCIN HCL 1 G IV SOLR
INTRAVENOUS | Status: DC | PRN
Start: 2022-08-28 — End: 2022-08-28
  Administered 2022-08-28: 1000 mg via TOPICAL

## 2022-08-28 MED ORDER — ASPIRIN 81 MG PO TBEC
81.0000 mg | DELAYED_RELEASE_TABLET | Freq: Every day | ORAL | Status: DC
  Administered 2022-08-29 – 2022-08-31 (×2): 81 mg via ORAL
  Filled 2022-08-28 (×2): qty 1

## 2022-08-28 MED ORDER — ONDANSETRON HCL 4 MG/2ML IJ SOLN: 4.0000 mg | Freq: Once | INTRAMUSCULAR | Status: DC | PRN

## 2022-08-28 MED ORDER — PHENOL 1.4 % MT LIQD: 1.0000 | OROMUCOSAL | Status: DC | PRN

## 2022-08-28 MED ORDER — ONDANSETRON HCL 4 MG/2ML IJ SOLN: 4.0000 mg | Freq: Four times a day (QID) | INTRAMUSCULAR | Status: DC | PRN

## 2022-08-28 MED ORDER — FENTANYL CITRATE (PF) 100 MCG/2ML IJ SOLN: 25.0000 ug | INTRAMUSCULAR | Status: DC | PRN

## 2022-08-28 MED ORDER — ACETAMINOPHEN 325 MG PO TABS
325.0000 mg | ORAL_TABLET | Freq: Four times a day (QID) | ORAL | Status: DC | PRN
  Administered 2022-08-28: 650 mg via ORAL
  Filled 2022-08-28: qty 2

## 2022-08-28 MED ORDER — CHLORHEXIDINE GLUCONATE 0.12 % MT SOLN
OROMUCOSAL | Status: AC
  Administered 2022-08-28: 15 mL via OROMUCOSAL
  Filled 2022-08-28: qty 15

## 2022-08-28 MED ORDER — VASHE WOUND IRRIGATION OPTIME
TOPICAL | Status: DC | PRN
Start: 2022-08-28 — End: 2022-08-28
  Administered 2022-08-28: 34 [oz_av]

## 2022-08-28 MED ORDER — ORAL CARE MOUTH RINSE: 15.0000 mL | Freq: Once | OROMUCOSAL | Status: AC

## 2022-08-28 MED ORDER — SODIUM CHLORIDE 0.9 % IV SOLN: INTRAVENOUS | Status: DC

## 2022-08-28 MED ORDER — POTASSIUM CHLORIDE CRYS ER 20 MEQ PO TBCR: 20.0000 meq | EXTENDED_RELEASE_TABLET | Freq: Every day | ORAL | Status: DC | PRN

## 2022-08-28 MED ORDER — OXYCODONE HCL 5 MG PO TABS: 10.0000 mg | ORAL_TABLET | ORAL | Status: DC | PRN

## 2022-08-28 MED ORDER — CHLORHEXIDINE GLUCONATE 0.12 % MT SOLN: 15.0000 mL | Freq: Once | OROMUCOSAL | Status: AC

## 2022-08-28 MED ORDER — OXYCODONE HCL 5 MG/5ML PO SOLN: 5.0000 mg | Freq: Once | ORAL | Status: DC | PRN

## 2022-08-28 MED ORDER — METHIMAZOLE 10 MG PO TABS
10.0000 mg | ORAL_TABLET | ORAL | Status: DC
  Administered 2022-08-29 – 2022-08-31 (×2): 10 mg via ORAL
  Filled 2022-08-28 (×2): qty 1

## 2022-08-28 MED ORDER — BISACODYL 5 MG PO TBEC: 5.0000 mg | DELAYED_RELEASE_TABLET | Freq: Every day | ORAL | Status: DC | PRN

## 2022-08-28 MED ORDER — HYDROMORPHONE HCL 1 MG/ML IJ SOLN: 0.5000 mg | INTRAMUSCULAR | Status: DC | PRN

## 2022-08-28 MED ORDER — LACTATED RINGERS IV SOLN: INTRAVENOUS | Status: DC

## 2022-08-28 MED ORDER — BENZTROPINE MESYLATE 1 MG PO TABS
1.0000 mg | ORAL_TABLET | Freq: Two times a day (BID) | ORAL | Status: DC
  Administered 2022-08-28 – 2022-08-31 (×5): 1 mg via ORAL
  Filled 2022-08-28 (×7): qty 1

## 2022-08-28 MED ORDER — PANTOPRAZOLE SODIUM 40 MG PO TBEC: 40.0000 mg | DELAYED_RELEASE_TABLET | Freq: Every day | ORAL | Status: DC

## 2022-08-28 MED ORDER — OXYCODONE HCL 5 MG PO TABS: 5.0000 mg | ORAL_TABLET | Freq: Once | ORAL | Status: DC | PRN

## 2022-08-28 MED ORDER — ZINC SULFATE 220 (50 ZN) MG PO CAPS
220.0000 mg | ORAL_CAPSULE | Freq: Every day | ORAL | Status: DC
  Administered 2022-08-28 – 2022-08-31 (×3): 220 mg via ORAL
  Filled 2022-08-28 (×3): qty 1

## 2022-08-28 MED ORDER — PANTOPRAZOLE SODIUM 40 MG PO TBEC
40.0000 mg | DELAYED_RELEASE_TABLET | Freq: Every day | ORAL | Status: DC
  Administered 2022-08-29 – 2022-08-31 (×2): 40 mg via ORAL
  Filled 2022-08-28 (×2): qty 1

## 2022-08-28 MED ORDER — TRAZODONE HCL 50 MG PO TABS
100.0000 mg | ORAL_TABLET | Freq: Every day | ORAL | Status: DC
  Administered 2022-08-28 – 2022-08-30 (×3): 100 mg via ORAL
  Filled 2022-08-28 (×3): qty 2

## 2022-08-28 SURGICAL SUPPLY — 41 items
BAG COUNTER SPONGE SURGICOUNT (BAG) IMPLANT
BAG SPNG CNTER NS LX DISP (BAG)
BLADE SURG 21 STRL SS (BLADE) ×2 IMPLANT
BNDG CMPR 5X4 CHSV STRCH STRL (GAUZE/BANDAGES/DRESSINGS) ×1
BNDG CMPR 5X6 CHSV STRCH STRL (GAUZE/BANDAGES/DRESSINGS)
BNDG COHESIVE 4X5 TAN STRL LF (GAUZE/BANDAGES/DRESSINGS) IMPLANT
BNDG COHESIVE 6X5 TAN ST LF (GAUZE/BANDAGES/DRESSINGS) IMPLANT
BNDG GAUZE DERMACEA FLUFF 4 (GAUZE/BANDAGES/DRESSINGS) ×4 IMPLANT
BNDG GZE DERMACEA 4 6PLY (GAUZE/BANDAGES/DRESSINGS)
CNTNR URN SCR LID CUP LEK RST (MISCELLANEOUS) IMPLANT
CONT SPEC 4OZ STRL OR WHT (MISCELLANEOUS) ×2
COVER SURGICAL LIGHT HANDLE (MISCELLANEOUS) ×4 IMPLANT
DRAPE U-SHAPE 47X51 STRL (DRAPES) ×2 IMPLANT
DRESSING VERAFLO CLEANS CC MED (GAUZE/BANDAGES/DRESSINGS) IMPLANT
DRSG ADAPTIC 3X8 NADH LF (GAUZE/BANDAGES/DRESSINGS) ×2 IMPLANT
DRSG VERAFLO CLEANSE CC MED (GAUZE/BANDAGES/DRESSINGS) ×1
DURAPREP 26ML APPLICATOR (WOUND CARE) ×2 IMPLANT
ELECT REM PT RETURN 9FT ADLT (ELECTROSURGICAL) ×1
ELECTRODE REM PT RTRN 9FT ADLT (ELECTROSURGICAL) IMPLANT
GAUZE SPONGE 4X4 12PLY STRL (GAUZE/BANDAGES/DRESSINGS) ×2 IMPLANT
GLOVE BIOGEL PI IND STRL 9 (GLOVE) ×2 IMPLANT
GLOVE SURG ORTHO 9.0 STRL STRW (GLOVE) ×2 IMPLANT
GOWN STRL REUS W/ TWL XL LVL3 (GOWN DISPOSABLE) ×4 IMPLANT
GOWN STRL REUS W/TWL XL LVL3 (GOWN DISPOSABLE) ×1
GRAFT SKIN WND MICRO 38 (Tissue) IMPLANT
HANDPIECE INTERPULSE COAX TIP (DISPOSABLE)
KIT BASIN OR (CUSTOM PROCEDURE TRAY) ×2 IMPLANT
KIT TURNOVER KIT B (KITS) ×2 IMPLANT
MANIFOLD NEPTUNE II (INSTRUMENTS) ×2 IMPLANT
NS IRRIG 1000ML POUR BTL (IV SOLUTION) ×2 IMPLANT
PACK ORTHO EXTREMITY (CUSTOM PROCEDURE TRAY) ×2 IMPLANT
PAD ARMBOARD 7.5X6 YLW CONV (MISCELLANEOUS) ×4 IMPLANT
PAD NEG PRESSURE SENSATRAC (MISCELLANEOUS) IMPLANT
SET HNDPC FAN SPRY TIP SCT (DISPOSABLE) IMPLANT
STOCKINETTE IMPERVIOUS 9X36 MD (GAUZE/BANDAGES/DRESSINGS) IMPLANT
SUT ETHILON 2 0 PSLX (SUTURE) ×2 IMPLANT
SWAB COLLECTION DEVICE MRSA (MISCELLANEOUS) ×2 IMPLANT
SWAB CULTURE ESWAB REG 1ML (MISCELLANEOUS) IMPLANT
TOWEL GREEN STERILE (TOWEL DISPOSABLE) ×2 IMPLANT
TUBE CONNECTING 12X1/4 (SUCTIONS) ×2 IMPLANT
YANKAUER SUCT BULB TIP NO VENT (SUCTIONS) ×2 IMPLANT

## 2022-08-28 NOTE — Transfer of Care (Signed)
Immediate Anesthesia Transfer of Care Note  Patient: Cassidy Smith  Procedure(s) Performed: RIGHT FOOT DEBRIDEMENT (Right: Foot)  Patient Location: PACU  Anesthesia Type:MAC  Level of Consciousness: awake, alert , and oriented  Airway & Oxygen Therapy: Patient Spontanous Breathing  Post-op Assessment: Report given to RN and Post -op Vital signs reviewed and stable  Post vital signs: Reviewed and stable  Last Vitals:  Vitals Value Taken Time  BP 107/96 08/28/22 1326  Temp 98   Pulse 60   Resp 30 08/28/22 1327  SpO2    Vitals shown include unfiled device data.  Last Pain:  Vitals:   08/28/22 1057  TempSrc:   PainSc: 0-No pain         Complications: No notable events documented.

## 2022-08-28 NOTE — Anesthesia Postprocedure Evaluation (Signed)
Anesthesia Post Note  Patient: Cassidy Smith  Procedure(s) Performed: RIGHT FOOT DEBRIDEMENT (Right: Foot)     Patient location during evaluation: PACU Anesthesia Type: MAC Level of consciousness: awake and alert Pain management: pain level controlled Vital Signs Assessment: post-procedure vital signs reviewed and stable Respiratory status: spontaneous breathing, nonlabored ventilation, respiratory function stable and patient connected to nasal cannula oxygen Cardiovascular status: stable and blood pressure returned to baseline Postop Assessment: no apparent nausea or vomiting Anesthetic complications: no   No notable events documented.  Last Vitals:  Vitals:   08/28/22 1345 08/28/22 1400  BP: 103/72 (!) 102/91  Pulse: 71 68  Resp: 16 20  Temp:    SpO2: 95% 95%    Last Pain:  Vitals:   08/28/22 1345  TempSrc:   PainSc: 0-No pain                 Mariann Barter

## 2022-08-28 NOTE — Interval H&P Note (Signed)
History and Physical Interval Note:  08/28/2022 11:53 AM  Cassidy Smith  has presented today for surgery, with the diagnosis of Osteomyelitis Right Foot.  The various methods of treatment have been discussed with the patient and family. After consideration of risks, benefits and other options for treatment, the patient has consented to  Procedure(s): RIGHT FOOT DEBRIDEMENT (Right) as a surgical intervention.  The patient's history has been reviewed, patient examined, no change in status, stable for surgery.  I have reviewed the patient's chart and labs.  Questions were answered to the patient's satisfaction.     [name]

## 2022-08-28 NOTE — H&P (Signed)
Cassidy Smith is an 67 y.o. female.   Chief Complaint: Painful ulceration dorsum right foot. HPI: Patient is a 66 year old woman who initially developed a pressure injury on the dorsum of her right foot from a cast for closed treatment of an ankle fracture. Patient developed a large full-thickness ulcer underwent 2 surgical debridements in the hospital. Patient presents at this time with healthy granulation tissue around the circumferential area of the bone however she has large area of necrotic bone in the central aspect. She is currently on doxycycline.   Past Medical History:  Diagnosis Date   Anxiety    Arthritis    Bipolar 1 disorder (HCC)    Chronic deep vein thrombosis (DVT) of distal vein of lower extremity (HCC) 07/10/2017   Chronic hip pain    Depression    GI bleed    Headache(784.0)    History of UTI/bladder spams.    Hyperthyroidism    Hypokalemia 08/15/2017   Intracerebral hematoma (HCC) 07/04/2019   Ischemic stroke (HCC)    Neuromuscular disorder (HCC)    shaking of hands    Pneumonia of right lower lobe due to infectious organism 11/06/2019   Psychosis [facility])    Radial styloid fracture: right 03/06/2017   Rhabdomyolysis 06/07/2015   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   Thyrotoxicosis, unspecified with thyrotoxic crisis or storm 01/15/2019    Past Surgical History:  Procedure Laterality Date   COLONOSCOPY WITH PROPOFOL N/A 04/05/2017   Procedure: COLONOSCOPY WITH PROPOFOL;  Surgeon: Malissa Hippo, MD;  Location: [facility];  Service: Endoscopy;  Laterality: N/A;   I & D EXTREMITY Right 07/31/2022   Procedure: RIGHT FOOT DEBRIDEMENT AND TISSUE GRAFT;  Surgeon: [name], MD;  Location: [facility] OR;  Service: Orthopedics;  Laterality: Right;   I & D EXTREMITY Right 08/03/2022   Procedure: RIGHT FOOT DEBRIDEMENT;  Surgeon: [name], MD;  Location: [facility] OR;  Service: Orthopedics;  Laterality: Right;   INTRAMEDULLARY (IM) NAIL  INTERTROCHANTERIC Right 03/07/2017   Procedure: OPEN TREATMENT INTERNAL FIXATION RIGHT HIP WITH GAMA INTRAMEDULARY NAIL;  Surgeon: Vickki Hearing, MD;  Location: [facility] ORS;  Service: Orthopedics;  Laterality: Right;   MULTIPLE EXTRACTIONS WITH ALVEOLOPLASTY N/A 10/30/2012   Procedure: MULTIPLE EXTRACION 5, 6, 8, 9, 10 ,18, 19, 31 WITH MAXILLARY RIGHT AND LEFT  ALVEOLOPLASTY REDUCE MAXILLARY LEFT TUBEROSITY;  Surgeon: Georgia Lopes, DDS;  Location: [facility] OR;  Service: Oral Surgery;  Laterality: N/A;   POLYPECTOMY  04/05/2017   Procedure: POLYPECTOMY;  Surgeon: Malissa Hippo, MD;  Location: [facility];  Service: Endoscopy;;  recto-sigmoid, rectum    Family History  Problem Relation Age of Onset   Breast cancer Mother    Cancer - Other Mother    Alcoholism Father    Colon cancer Neg Hx    Colon polyps Neg Hx    Social History:  reports that she quit smoking about 5 years ago. Her smoking use included cigarettes. She started smoking about 25 years ago. She has a 10 pack-year smoking history. She has never used smokeless tobacco. She reports that she does not drink alcohol and does not use drugs.  Allergies: No Known Allergies  No medications prior to admission.    No results found for this or any previous visit (from the past 48 hour(s)). No results found.  Review of Systems  All other systems reviewed and are negative.   There were no vitals taken for this visit.  Physical Exam  Patient is alert, oriented, no adenopathy, well-dressed, normal affect, normal respiratory effort.  Examination patient has necrotic tissue 2 x 2 cm in the mid aspect of the wound.  The total wound area is 5 x 6 cm with healthy granulation tissue around the wound edges.  A rondure was used to remove some of the nonviable bone.  There is no deep abscess.  She does have swelling. Assessment/Plan 1. Subacute osteomyelitis, right ankle and foot (HCC)   2. Pressure injury of deep tissue of dorsum of right foot        Plan: Discussed with the patient we could proceed with continued foot salvage intervention would plan for surgical excisional debridement of the bone across the midfoot on Wednesday with repeat debridement on Friday.  Discussed patient is at high risk of loss of her foot.  [name], MD 08/28/2022, 7:17 AM

## 2022-08-28 NOTE — Anesthesia Preprocedure Evaluation (Addendum)
Anesthesia Evaluation  Patient identified by MRN, date of birth, ID band Patient awake    Reviewed: Allergy & Precautions, NPO status , Patient's Chart, lab work & pertinent test results, reviewed documented beta blocker date and time   History of Anesthesia Complications Negative for: history of anesthetic complications  Airway Mallampati: I       Dental  (+) Edentulous Upper, Edentulous Lower   Pulmonary pneumonia, neg COPD, neg recent URI, former smoker, neg PE    + decreased breath sounds      Cardiovascular (-) hypertension(-) CAD, (-) Past MI, (-) CHF and (-) Orthopnea negative cardio ROS Normal cardiovascular exam(-) Valvular Problems/Murmurs Rhythm:Regular Rate:Normal     Neuro/Psych  Headaches PSYCHIATRIC DISORDERS Anxiety Depression Bipolar Disorder  Dementia CVA    GI/Hepatic ,GERD  ,,  Endo/Other   Hyperthyroidism   Renal/GU      Musculoskeletal  (+) Arthritis ,    Abdominal   Peds  Hematology  (+) Blood dyscrasia, anemia   Anesthesia Other Findings   Reproductive/Obstetrics                              Anesthesia Physical Anesthesia Plan  ASA: 3  Anesthesia Plan: MAC   Post-op Pain Management: Minimal or no pain anticipated   Induction: Intravenous  PONV Risk Score and Plan: 1 and Ondansetron and TIVA  Airway Management Planned:   Additional Equipment: None  Intra-op Plan:   Post-operative Plan: Extubation in OR  Informed Consent: I have reviewed the patients History and Physical, chart, labs and discussed the procedure including the risks, benefits and alternatives for the proposed anesthesia with the patient or authorized representative who has indicated his/her understanding and acceptance.       Plan Discussed with: CRNA and Anesthesiologist  Anesthesia Plan Comments:         Anesthesia Quick Evaluation

## 2022-08-28 NOTE — Op Note (Signed)
08/28/2022  1:50 PM  PATIENT:  [name]    PRE-OPERATIVE DIAGNOSIS:  Osteomyelitis Right Foot  POST-OPERATIVE DIAGNOSIS:  Same  PROCEDURE:  RIGHT FOOT DEBRIDEMENT Excision of skin and soft tissue and bone.  Bone excised from the base of the metatarsals and cuneiforms. Application of Kerecis micro graft 38 cm. Application of vancomycin powder 1 g. Tissue sent for cultures. Application of cleanse choice wound VAC sponge x 1  SURGEON:  [name], MD  PHYSICIAN ASSISTANT:None ANESTHESIA:   General  PREOPERATIVE INDICATIONS:  Cassidy Smith is a  67 y.o. female with a diagnosis of Osteomyelitis Right Foot who failed conservative measures and elected for surgical management.    The risks benefits and alternatives were discussed with the patient preoperatively including but not limited to the risks of infection, bleeding, nerve injury, cardiopulmonary complications, the need for revision surgery, among others, and the patient was willing to proceed.  OPERATIVE IMPLANTS:   Implant Name Type Inv. Item Serial No. Manufacturer Lot No. LRB No. Used Action  GRAFT SKIN WND MICRO 38 - [id number] Tissue GRAFT SKIN WND MICRO 38  [employer] [id number] Right 1 Implanted    @ENCIMAGES @  OPERATIVE FINDINGS: Patient had ischemic bone across the midfoot.  This was debrided back to bleeding viable bone.  Tissue was sent for cultures.  OPERATIVE PROCEDURE: Patient was brought the operating room after undergoing a regional anesthetic.  After adequate levels anesthesia were obtained patient's right lower extremity was prepped using DuraPrep draped into a sterile field a timeout was called.  A rondure was used to excise bone and soft tissue.  Bone resected from the cuneiforms and base of the metatarsals at the base of the second metatarsal.  The wound was irrigated with normal saline and Vashe.  The wound bed was filled with vancomycin powder 1 g and Kerecis 38 cm.  A cleanse choice wound VAC  was applied this had a good suction fit patient was taken the PACU in stable condition.   DISCHARGE PLANNING:  Antibiotic duration: Continue antibiotics until sensitivities obtained  Weightbearing: Weightbearing as tolerated on the right  Pain medication: Opioid pathway  Dressing care/ Wound VAC: Continue wound VAC  Ambulatory devices: Walker  Discharge to: Plan for return to the operating room on Friday.  Follow-up: In the office 1 week post operative.

## 2022-08-29 ENCOUNTER — Other Ambulatory Visit: Payer: Self-pay

## 2022-08-29 ENCOUNTER — Encounter (HOSPITAL_COMMUNITY): Payer: Self-pay | Admitting: Orthopedic Surgery

## 2022-08-29 MED ORDER — CEFAZOLIN SODIUM-DEXTROSE 2-4 GM/100ML-% IV SOLN
2.0000 g | INTRAVENOUS | Status: AC
  Administered 2022-08-30: 2 g via INTRAVENOUS
  Filled 2022-08-29: qty 100

## 2022-08-29 MED ORDER — SODIUM CHLORIDE 0.9 % IV SOLN
2.0000 g | Freq: Three times a day (TID) | INTRAVENOUS | Status: DC
  Administered 2022-08-29 – 2022-08-31 (×6): 2 g via INTRAVENOUS
  Filled 2022-08-29 (×6): qty 12.5

## 2022-08-29 MED ORDER — CHLORHEXIDINE GLUCONATE 4 % EX SOLN
60.0000 mL | Freq: Once | CUTANEOUS | Status: AC
  Administered 2022-08-29: 4 via TOPICAL
  Filled 2022-08-29: qty 60

## 2022-08-29 MED ORDER — POVIDONE-IODINE 10 % EX SWAB
2.0000 | Freq: Once | CUTANEOUS | Status: AC
  Administered 2022-08-29: 2 via TOPICAL

## 2022-08-29 NOTE — H&P (View-Only) (Signed)
 Patient ID: Cassidy Smith, female   DOB: 1955/02/01, 67 y.o.   MRN: [id number] Patient is postoperative day 1 debridement of necrotic bone right midfoot.  Cultures are showing gram-negative rods.  There is 25 cc in the wound VAC canister with a good suction fit.  Plan for return to the operating room tomorrow for repeat debridement and tissue graft.  Will start Maxipime IV antibiotics.

## 2022-08-29 NOTE — Plan of Care (Signed)

## 2022-08-29 NOTE — Progress Notes (Signed)
Patient ID: Cassidy Smith, female   DOB: 1955/02/01, 67 y.o.   MRN: [id number] Patient is postoperative day 1 debridement of necrotic bone right midfoot.  Cultures are showing gram-negative rods.  There is 25 cc in the wound VAC canister with a good suction fit.  Plan for return to the operating room tomorrow for repeat debridement and tissue graft.  Will start Maxipime IV antibiotics.

## 2022-08-29 NOTE — Plan of Care (Signed)
  Problem: Clinical Measurements: Goal: Postoperative complications will be avoided or minimized Outcome: Progressing   Problem: Self-Care: Goal: Ability to meet self-care needs will improve Outcome: Progressing   Problem: Pain Management: Goal: Pain level will decrease with appropriate interventions Outcome: Progressing   Problem: Skin Integrity: Goal: Demonstration of wound healing without infection will improve Outcome: Progressing   Problem: Clinical Measurements: Goal: Will remain free from infection Outcome: Progressing Goal: Diagnostic test results will improve Outcome: Progressing Goal: Respiratory complications will improve Outcome: Progressing Goal: Cardiovascular complication will be avoided Outcome: Progressing   Problem: Pain Managment: Goal: General experience of comfort will improve Outcome: Progressing

## 2022-08-29 NOTE — Progress Notes (Signed)
PHARMACY ANTIBIOTIC CONSULT NOTE   Cassidy Smith a 67 y.o. female admitted with a right foot ulcer with necrosis on doxycycline PTA. Pharmacy has been consulted for cefepime dosing. S/P I and D 8/14 with plans to return to OR 8/16. Gram-stain from cultures taken in OR 8/14 showing GNRs.   Most recent serum creatinine 0.56 08/04/22.  Plan: START Cefepime 2g IV Q8H Monitor renal function, clinical status, C/S, de-escalation   Allergies:  No Known Allergies  Filed Weights   08/28/22 1057  Weight: 45.7 kg (100 lb 11.2 oz)       Latest Ref Rng & Units 08/01/2022    1:21 AM 07/31/2022    8:39 AM 04/11/2022   12:56 PM  CBC  WBC 4.0 - 10.5 K/uL 7.0  9.3  10.8   Hemoglobin 12.0 - 15.0 g/dL 16.1  09.6  04.5   Hematocrit 36.0 - 46.0 % 33.8  38.1  36.7   Platelets 150 - 400 K/uL 157  160  228     Antibiotics Given (last 72 hours)     Date/Time Action Medication Dose Rate   08/28/22 1309 Given   ceFAZolin (ANCEF) IVPB 2g/100 mL premix 2 g    08/28/22 1313 Given  [Exp 2025]   vancomycin (VANCOCIN) powder 1,000 mg    08/28/22 1609 New Bag/Given   ceFAZolin (ANCEF) IVPB 2g/100 mL premix 2 g 200 mL/hr   08/29/22 0245 New Bag/Given   ceFAZolin (ANCEF) IVPB 2g/100 mL premix 2 g 200 mL/hr       Antimicrobials this admission: CFP 8/15>>c   Microbiology results: 8/14 OR cx: moderate GNRs   Thank you for allowing pharmacy to be a part of this patient's care.  Jani Gravel, PharmD Clinical Pharmacist  08/29/2022 7:46 AM

## 2022-08-29 NOTE — Evaluation (Signed)
Physical Therapy Evaluation Patient Details Name: Cassidy Smith MRN: [id number] DOB: [birth year] Today's Date: 08/29/2022  History of Present Illness  Patient is a 67 year old woman who initially developed a pressure injury on the dorsum of her right foot from a cast for closed treatment of an ankle fracture. Patient developed a large full-thickness ulcer underwent 2 surgical debridements in the hospital. Pt is s/p R foot debridement on 8/14 and plan for additional debridement on 8/16. PMH: Bipolar 1, ischemic stroke, intracerebral hematoma, neuromuscular disorder.  Clinical Impression  Pt presents with admitting diagnosis above. Pt today required Max A for stand pivot to chair. Opted for 1 person HHA due to lack of RW in room however pt unable to weightbear through RLE and kept it in the air for entire transfer. Pt also tried to sit prematurely requiring Max A for safety. Pt was A&Ox4 however appeared to be a poor historian as subjective history did not match up to recent admission. Recommend SNF upon DC. PT will continue to follow.      If plan is discharge home, recommend the following: A lot of help with walking and/or transfers;A little help with bathing/dressing/bathroom;Assistance with cooking/housework;Direct supervision/assist for medications management;Assist for transportation;Help with stairs or ramp for entrance   Can travel by private vehicle   No    Equipment Recommendations Other (comment) (Per accepting facility)  Recommendations for Other Services       Functional Status Assessment Patient has had a recent decline in their functional status and demonstrates the ability to make significant improvements in function in a reasonable and predictable amount of time.     Precautions / Restrictions Precautions Precautions: Fall Restrictions Weight Bearing Restrictions: Yes RLE Weight Bearing: Weight bearing as tolerated      Mobility  Bed Mobility Overal bed mobility:  Needs Assistance Bed Mobility: Supine to Sit     Supine to sit: Contact guard, Used rails     General bed mobility comments: CGA for safety. Pt heavily reliant on momentum.    Transfers Overall transfer level: Needs assistance Equipment used: 1 person hand held assist (Due to lack of RW in contact room) Transfers: Bed to chair/wheelchair/BSC, Sit to/from Stand Sit to Stand: Mod assist Stand pivot transfers: Max assist         General transfer comment: Opted for 1 person HHA due to lack of RW in room however pt required Max A for stand pivot transfer. Pt unable to weightbear through RLE and kept it in the air for entire transfer. Pt also tried to sit prematurely requiring Max A for safety.    Ambulation/Gait               General Gait Details: Deferred for safety  Stairs            Wheelchair Mobility     Tilt Bed    Modified Rankin (Stroke Patients Only)       Balance Overall balance assessment: Needs assistance Sitting-balance support: Bilateral upper extremity supported, Feet supported Sitting balance-Leahy Scale: Fair     Standing balance support: Bilateral upper extremity supported, During functional activity Standing balance-Leahy Scale: Poor Standing balance comment: Reliant on therapist                             Pertinent Vitals/Pain Pain Assessment Pain Assessment: 0-10 Pain Score: 8  Pain Location: R foot Pain Descriptors / Indicators: Discomfort, Grimacing Pain Intervention(s): Monitored during session, Limited  activity within patient's tolerance    Home Living Family/patient expects to be discharged to:: Private residence Living Arrangements: Other relatives Available Help at Discharge: Family;Available 24 hours/day Type of Home: House Home Access: Stairs to enter Entrance Stairs-Rails: Right Entrance Stairs-Number of Steps: 1 (1 step outside and 3 steps inside)   Home Layout: Able to live on main level with  bedroom/bathroom Home Equipment: Rolling Walker (2 wheels);Cane - single point;Cane - quad;Hand held shower head;Shower seat;Wheelchair - manual Additional Comments: poor historian, no family present to verify    Prior Function Prior Level of Function : Needs assist;Patient poor historian/Family not available             Mobility Comments: Pt reports furniture walking around the house ADLs Comments: independent with ADLs, brothers assist with iADLs. Has PCA from 8am to 9pm daily per chart review.     Extremity/Trunk Assessment   Upper Extremity Assessment Upper Extremity Assessment: Overall WFL for tasks assessed    Lower Extremity Assessment Lower Extremity Assessment: Generalized weakness    Cervical / Trunk Assessment Cervical / Trunk Assessment: Kyphotic  Communication   Communication Communication: No apparent difficulties  Cognition Arousal: Alert Behavior During Therapy: WFL for tasks assessed/performed Overall Cognitive Status: No family/caregiver present to determine baseline cognitive functioning                                 General Comments: Pt was A&Ox4 however appeared to be a poor historian. Pt was previously admitted last month and history did not match up.        General Comments General comments (skin integrity, edema, etc.): VSS    Exercises     Assessment/Plan    PT Assessment Patient needs continued PT services  PT Problem List Decreased strength;Decreased range of motion;Decreased activity tolerance;Decreased balance;Decreased mobility;Decreased coordination;Decreased cognition;Decreased knowledge of use of DME;Decreased safety awareness;Decreased knowledge of precautions;Pain       PT Treatment Interventions DME instruction;Gait training;Functional mobility training;Stair training;Therapeutic activities;Therapeutic exercise;Balance training;Neuromuscular re-education;Cognitive remediation;Patient/family education    PT Goals  (Current goals can be found in the Care Plan section)  Acute Rehab PT Goals Patient Stated Goal: to go home PT Goal Formulation: With patient Time For Goal Achievement: 09/12/22 Potential to Achieve Goals: Fair    Frequency Min 1X/week     Co-evaluation               AM-PAC PT "6 Clicks" Mobility  Outcome Measure Help needed turning from your back to your side while in a flat bed without using bedrails?: A Little Help needed moving from lying on your back to sitting on the side of a flat bed without using bedrails?: A Little Help needed moving to and from a bed to a chair (including a wheelchair)?: A Lot Help needed standing up from a chair using your arms (e.g., wheelchair or bedside chair)?: A Lot Help needed to walk in hospital room?: Total Help needed climbing 3-5 steps with a railing? : Total 6 Click Score: 12    End of Session Equipment Utilized During Treatment: Gait belt Activity Tolerance: Patient tolerated treatment well Patient left: in chair;with call bell/phone within reach;with chair alarm set Nurse Communication: Mobility status PT Visit Diagnosis: Other abnormalities of gait and mobility (R26.89)    Time: 1610-9604 PT Time Calculation (min) (ACUTE ONLY): 27 min   Charges:   PT Evaluation $PT Eval Moderate Complexity: 1 Mod PT Treatments $Therapeutic  Activity: 8-22 mins PT General Charges $$ ACUTE PT VISIT: 1 Visit         Shela Nevin, PT, DPT Acute Rehab Services [phone]   Gladys Damme 08/29/2022, 12:56 PM

## 2022-08-30 ENCOUNTER — Inpatient Hospital Stay (HOSPITAL_COMMUNITY): Payer: Medicare Other | Admitting: Anesthesiology

## 2022-08-30 ENCOUNTER — Other Ambulatory Visit: Payer: Self-pay

## 2022-08-30 ENCOUNTER — Encounter (HOSPITAL_COMMUNITY): Payer: Self-pay | Admitting: Orthopedic Surgery

## 2022-08-30 ENCOUNTER — Encounter (HOSPITAL_COMMUNITY)
Admission: RE | Disposition: A | Discharge status: Home / Self Care | Payer: Self-pay | Source: Home / Self Care | Attending: Orthopedic Surgery

## 2022-08-30 DIAGNOSIS — E0590 Thyrotoxicosis, unspecified without thyrotoxic crisis or storm: Secondary | ICD-10-CM | POA: Diagnosis not present

## 2022-08-30 DIAGNOSIS — I679 Cerebrovascular disease, unspecified: Secondary | ICD-10-CM

## 2022-08-30 DIAGNOSIS — M86171 Other acute osteomyelitis, right ankle and foot: Secondary | ICD-10-CM | POA: Diagnosis not present

## 2022-08-30 DIAGNOSIS — I5031 Acute diastolic (congestive) heart failure: Secondary | ICD-10-CM | POA: Diagnosis not present

## 2022-08-30 DIAGNOSIS — M869 Osteomyelitis, unspecified: Secondary | ICD-10-CM | POA: Diagnosis not present

## 2022-08-30 HISTORY — PX: I & D EXTREMITY: SHX5045

## 2022-08-30 LAB — SURGICAL PCR SCREEN
MRSA, PCR: NEGATIVE
Staphylococcus aureus: NEGATIVE

## 2022-08-30 SURGERY — IRRIGATION AND DEBRIDEMENT EXTREMITY
Anesthesia: Monitor Anesthesia Care | Laterality: Right

## 2022-08-30 MED ORDER — FENTANYL CITRATE (PF) 250 MCG/5ML IJ SOLN
INTRAMUSCULAR | Status: DC | PRN
  Administered 2022-08-30: 50 ug via INTRAVENOUS
  Administered 2022-08-30: 25 ug via INTRAVENOUS

## 2022-08-30 MED ORDER — POLYETHYLENE GLYCOL 3350 17 G PO PACK: 17.0000 g | PACK | Freq: Every day | ORAL | Status: DC | PRN

## 2022-08-30 MED ORDER — ONDANSETRON HCL 4 MG PO TABS: 4.0000 mg | ORAL_TABLET | Freq: Four times a day (QID) | ORAL | Status: DC | PRN

## 2022-08-30 MED ORDER — ONDANSETRON HCL 4 MG/2ML IJ SOLN
INTRAMUSCULAR | Status: DC | PRN
  Administered 2022-08-30: 4 mg via INTRAVENOUS

## 2022-08-30 MED ORDER — BISACODYL 10 MG RE SUPP: 10.0000 mg | Freq: Every day | RECTAL | Status: DC | PRN

## 2022-08-30 MED ORDER — METOCLOPRAMIDE HCL 5 MG PO TABS: 5.0000 mg | ORAL_TABLET | Freq: Three times a day (TID) | ORAL | Status: DC | PRN

## 2022-08-30 MED ORDER — ONDANSETRON HCL 4 MG/2ML IJ SOLN: 4.0000 mg | Freq: Four times a day (QID) | INTRAMUSCULAR | Status: DC | PRN

## 2022-08-30 MED ORDER — METOCLOPRAMIDE HCL 5 MG/ML IJ SOLN: 5.0000 mg | Freq: Three times a day (TID) | INTRAMUSCULAR | Status: DC | PRN

## 2022-08-30 MED ORDER — VASHE WOUND IRRIGATION OPTIME
TOPICAL | Status: DC | PRN
  Administered 2022-08-30: 34 [oz_av]

## 2022-08-30 MED ORDER — MAGNESIUM CITRATE PO SOLN: 1.0000 | Freq: Once | ORAL | Status: DC | PRN

## 2022-08-30 MED ORDER — CHLORHEXIDINE GLUCONATE 0.12 % MT SOLN: 15.0000 mL | Freq: Once | OROMUCOSAL | Status: AC

## 2022-08-30 MED ORDER — PROPOFOL 500 MG/50ML IV EMUL
INTRAVENOUS | Status: DC | PRN
  Administered 2022-08-30: 80 ug/kg/min via INTRAVENOUS

## 2022-08-30 MED ORDER — FENTANYL CITRATE (PF) 100 MCG/2ML IJ SOLN
25.0000 ug | INTRAMUSCULAR | Status: DC | PRN
  Administered 2022-08-30 (×2): 25 ug via INTRAVENOUS

## 2022-08-30 MED ORDER — DOCUSATE SODIUM 100 MG PO CAPS
100.0000 mg | ORAL_CAPSULE | Freq: Two times a day (BID) | ORAL | Status: DC
  Administered 2022-08-30 – 2022-08-31 (×2): 100 mg via ORAL
  Filled 2022-08-30 (×2): qty 1

## 2022-08-30 MED ORDER — FENTANYL CITRATE (PF) 100 MCG/2ML IJ SOLN
INTRAMUSCULAR | Status: AC
  Filled 2022-08-30: qty 2

## 2022-08-30 MED ORDER — ONDANSETRON HCL 4 MG/2ML IJ SOLN: 4.0000 mg | Freq: Once | INTRAMUSCULAR | Status: DC | PRN

## 2022-08-30 MED ORDER — METHOCARBAMOL 500 MG PO TABS: 500.0000 mg | ORAL_TABLET | Freq: Four times a day (QID) | ORAL | Status: DC | PRN

## 2022-08-30 MED ORDER — ORAL CARE MOUTH RINSE: 15.0000 mL | Freq: Once | OROMUCOSAL | Status: AC

## 2022-08-30 MED ORDER — METHOCARBAMOL 1000 MG/10ML IJ SOLN
500.0000 mg | Freq: Four times a day (QID) | INTRAVENOUS | Status: DC | PRN
  Filled 2022-08-30: qty 5

## 2022-08-30 MED ORDER — ACETAMINOPHEN 500 MG PO TABS
1000.0000 mg | ORAL_TABLET | Freq: Once | ORAL | Status: AC
  Administered 2022-08-30: 1000 mg via ORAL
  Filled 2022-08-30: qty 2

## 2022-08-30 MED ORDER — CHLORHEXIDINE GLUCONATE 0.12 % MT SOLN
OROMUCOSAL | Status: AC
  Administered 2022-08-30: 15 mL via OROMUCOSAL
  Filled 2022-08-30: qty 15

## 2022-08-30 MED ORDER — FENTANYL CITRATE (PF) 250 MCG/5ML IJ SOLN
INTRAMUSCULAR | Status: AC
  Filled 2022-08-30: qty 5

## 2022-08-30 MED ORDER — SODIUM CHLORIDE 0.9 % IV SOLN: INTRAVENOUS | Status: DC

## 2022-08-30 MED ORDER — MIDAZOLAM HCL 2 MG/2ML IJ SOLN
INTRAMUSCULAR | Status: AC
  Filled 2022-08-30: qty 2

## 2022-08-30 MED ORDER — LIDOCAINE 2% (20 MG/ML) 5 ML SYRINGE
INTRAMUSCULAR | Status: DC | PRN
  Administered 2022-08-30: 40 mg via INTRAVENOUS

## 2022-08-30 MED ORDER — PROPOFOL 10 MG/ML IV BOLUS
INTRAVENOUS | Status: DC | PRN
  Administered 2022-08-30: 30 mg via INTRAVENOUS

## 2022-08-30 MED ORDER — LACTATED RINGERS IV SOLN: INTRAVENOUS | Status: DC

## 2022-08-30 MED ORDER — 0.9 % SODIUM CHLORIDE (POUR BTL) OPTIME
TOPICAL | Status: DC | PRN
  Administered 2022-08-30: 1000 mL

## 2022-08-30 SURGICAL SUPPLY — 38 items
BAG COUNTER SPONGE SURGICOUNT (BAG) IMPLANT
BAG SPNG CNTER NS LX DISP (BAG)
BLADE SURG 21 STRL SS (BLADE) ×2 IMPLANT
BNDG CMPR 5X6 CHSV STRCH STRL (GAUZE/BANDAGES/DRESSINGS)
BNDG COHESIVE 6X5 TAN ST LF (GAUZE/BANDAGES/DRESSINGS) IMPLANT
BNDG GAUZE DERMACEA FLUFF 4 (GAUZE/BANDAGES/DRESSINGS) ×4 IMPLANT
BNDG GZE DERMACEA 4 6PLY (GAUZE/BANDAGES/DRESSINGS)
CANISTER WOUND CARE 500ML ATS (WOUND CARE) IMPLANT
CLEANSER WND VASHE 34 (WOUND CARE) IMPLANT
COVER SURGICAL LIGHT HANDLE (MISCELLANEOUS) ×4 IMPLANT
DRAPE U-SHAPE 47X51 STRL (DRAPES) ×2 IMPLANT
DRESSING VERAFLO CLEANS CC MED (GAUZE/BANDAGES/DRESSINGS) IMPLANT
DRSG ADAPTIC 3X8 NADH LF (GAUZE/BANDAGES/DRESSINGS) ×2 IMPLANT
DRSG VERAFLO CLEANSE CC MED (GAUZE/BANDAGES/DRESSINGS) ×1
DURAPREP 26ML APPLICATOR (WOUND CARE) ×2 IMPLANT
ELECT REM PT RETURN 9FT ADLT (ELECTROSURGICAL)
ELECTRODE REM PT RTRN 9FT ADLT (ELECTROSURGICAL) IMPLANT
GAUZE SPONGE 4X4 12PLY STRL (GAUZE/BANDAGES/DRESSINGS) ×2 IMPLANT
GLOVE BIOGEL PI IND STRL 9 (GLOVE) ×2 IMPLANT
GLOVE SURG ORTHO 9.0 STRL STRW (GLOVE) ×2 IMPLANT
GOWN STRL REUS W/ TWL XL LVL3 (GOWN DISPOSABLE) ×4 IMPLANT
GOWN STRL REUS W/TWL XL LVL3 (GOWN DISPOSABLE) ×2
GRAFT SKIN WND MICRO 38 (Tissue) IMPLANT
HANDPIECE INTERPULSE COAX TIP (DISPOSABLE)
KIT BASIN OR (CUSTOM PROCEDURE TRAY) ×2 IMPLANT
KIT TURNOVER KIT B (KITS) ×2 IMPLANT
MANIFOLD NEPTUNE II (INSTRUMENTS) ×2 IMPLANT
NS IRRIG 1000ML POUR BTL (IV SOLUTION) ×2 IMPLANT
PACK ORTHO EXTREMITY (CUSTOM PROCEDURE TRAY) ×2 IMPLANT
PAD ARMBOARD 7.5X6 YLW CONV (MISCELLANEOUS) ×4 IMPLANT
SET HNDPC FAN SPRY TIP SCT (DISPOSABLE) IMPLANT
STOCKINETTE IMPERVIOUS 9X36 MD (GAUZE/BANDAGES/DRESSINGS) IMPLANT
SUT ETHILON 2 0 PSLX (SUTURE) ×2 IMPLANT
SWAB COLLECTION DEVICE MRSA (MISCELLANEOUS) ×2 IMPLANT
SWAB CULTURE ESWAB REG 1ML (MISCELLANEOUS) IMPLANT
TOWEL GREEN STERILE (TOWEL DISPOSABLE) ×2 IMPLANT
TUBE CONNECTING 12X1/4 (SUCTIONS) ×2 IMPLANT
YANKAUER SUCT BULB TIP NO VENT (SUCTIONS) ×2 IMPLANT

## 2022-08-30 NOTE — Plan of Care (Signed)
  Problem: Pain Management: Goal: Pain level will decrease with appropriate interventions Outcome: Progressing   Problem: Skin Integrity: Goal: Demonstration of wound healing without infection will improve Outcome: Progressing   Problem: Education: Goal: Knowledge of General Education information will improve Description: Including pain rating scale, medication(s)/side effects and non-pharmacologic comfort measures Outcome: Progressing   Problem: Health Behavior/Discharge Planning: Goal: Ability to manage health-related needs will improve Outcome: Progressing   Problem: Clinical Measurements: Goal: Ability to maintain clinical measurements within normal limits will improve Outcome: Progressing Goal: Will remain free from infection Outcome: Progressing Goal: Diagnostic test results will improve Outcome: Progressing Goal: Respiratory complications will improve Outcome: Progressing Goal: Cardiovascular complication will be avoided Outcome: Progressing   Problem: Activity: Goal: Risk for activity intolerance will decrease Outcome: Progressing

## 2022-08-30 NOTE — TOC Progression Note (Signed)
Transition of Care [facility]) - Progression Note    Patient Details  Name: Cassidy Smith MRN: [id number] Date of Birth: 1955/11/07  [facility] of [facility]) CM/SW Contact  Epifanio Lesches, RN Phone Number: 08/30/2022, 3:24 PM  Clinical Narrative:    Readmit, Osteomyelitis Right Foot.    -s/p R FOOT EXCISIONAL DEBRIDEMENT, 8/16 PTA active with [facility] and would like to continue if neede @ d/c.  PT evaluation pending.    TOC team following and will assist with needs....  Expected Discharge Plan: Home w Home Health Services Barriers to Discharge: Continued Medical Work up  Expected Discharge Plan and Services In-house Referral: Clinical Social Work   Post Acute Care Choice: Home Health Living arrangements for the past 2 months: Skilled Nursing Facility                                       Social Determinants of Health (SDOH) Interventions SDOH Screenings   Food Insecurity: No Food Insecurity (07/31/2022)  Housing: Low Risk  (08/19/2022)  Transportation Needs: No Transportation Needs (08/19/2022)  Utilities: Not At Risk (07/31/2022)  Financial Resource Strain: Low Risk  (08/19/2022)  Tobacco Use: Medium Risk (08/30/2022)    Readmission Risk Interventions    07/26/2020   12:20 PM  Readmission Risk Prevention Plan  Transportation Screening Complete  Home Care Screening Complete  Medication Review (RN CM) Complete

## 2022-08-30 NOTE — Interval H&P Note (Signed)
History and Physical Interval Note:  08/30/2022 6:53 AM  Cassidy Smith  has presented today for surgery, with the diagnosis of Osteomyelitis Right Foot.  The various methods of treatment have been discussed with the patient and family. After consideration of risks, benefits and other options for treatment, the patient has consented to  Procedure(s): RIGHT FOOT DEBRIDEMENT (Right) as a surgical intervention.  The patient's history has been reviewed, patient examined, no change in status, stable for surgery.  I have reviewed the patient's chart and labs.  Questions were answered to the patient's satisfaction.     [name]

## 2022-08-30 NOTE — TOC Progression Note (Signed)
Transition of Care [facility]) - Progression Note    Patient Details  Name: Cassidy Smith MRN: [id number] Date of Birth: 05-04-1955  Transition of Care [facility]) CM/SW Contact  Lorri Frederick, LCSW Phone Number: 08/30/2022, 11:04 AM  Clinical Narrative:   CSW spoke with pt for initial assessment.  Pt listed in epic as oriented x3, was able to participate in conversation but some confusion evident.  From previous admission, pt lives at home with brother Cassidy Smith.  Brother Cassidy Smith also nearby and assists on daily basis.  Son Cassidy Smith in [place].  Permission given by pt to speak with son and both brothers.    CSW spoke with son Cassidy Smith on 8/16.  Last month, pt DC home with [facility] rather than go to SNF.  Cassidy Smith says this is working well and he does not want to pursue SNF this admission either.  HH aide also in place daily 8am-5pm.  CSW spoke with brother Cassidy Smith as well regarding this plan.    Additional procedure tomorrow.  TOC will continue to follow. .    Expected Discharge Plan: Home w Home Health Services Barriers to Discharge: Continued Medical Work up  Expected Discharge Plan and Services In-house Referral: Clinical Social Work   Post Acute Care Choice: Home Health Living arrangements for the past 2 months: Skilled Nursing Facility                                       Social Determinants of Health (SDOH) Interventions SDOH Screenings   Food Insecurity: No Food Insecurity (07/31/2022)  Housing: Low Risk  (08/19/2022)  Transportation Needs: No Transportation Needs (08/19/2022)  Utilities: Not At Risk (07/31/2022)  Financial Resource Strain: Low Risk  (08/19/2022)  Tobacco Use: Medium Risk (08/30/2022)    Readmission Risk Interventions    07/26/2020   12:20 PM  Readmission Risk Prevention Plan  Transportation Screening Complete  Home Care Screening Complete  Medication Review (RN CM) Complete

## 2022-08-30 NOTE — Plan of Care (Signed)

## 2022-08-30 NOTE — Transfer of Care (Signed)
Immediate Anesthesia Transfer of Care Note  Patient: [name]  Procedure(s) Performed: RIGHT FOOT DEBRIDEMENT (Right)  Patient Location: PACU  Anesthesia Type:MAC  Level of Consciousness: awake, drowsy, patient cooperative, and responds to stimulation  Airway & Oxygen Therapy: Patient Spontanous Breathing and Patient connected to nasal cannula oxygen  Post-op Assessment: Report given to RN and Post -op Vital signs reviewed and stable  Post vital signs: Reviewed and stable  Last Vitals:  Vitals Value Taken Time  BP 109/84 08/30/22 1008  Temp    Pulse 78 08/30/22 1009  Resp 14 08/30/22 1009  SpO2 100 % 08/30/22 1009  Vitals shown include unfiled device data.  Last Pain:  Vitals:   08/30/22 0846  TempSrc:   PainSc: 0-No pain         Complications: No notable events documented.

## 2022-08-30 NOTE — Op Note (Signed)
08/30/2022  10:05 AM  PATIENT:  [name]    PRE-OPERATIVE DIAGNOSIS:  Osteomyelitis Right Foot  POST-OPERATIVE DIAGNOSIS:  Same  PROCEDURE:  RIGHT FOOT EXCISIONAL DEBRIDEMENT, with excision skin and soft tissue muscle fascia and bone Application Kerecis micro graft 38 cm. Application of cleanse choice wound VAC sponge. SURGEON:  [name], MD  PHYSICIAN ASSISTANT:None ANESTHESIA:   General  PREOPERATIVE INDICATIONS:  TANYIA HYRE is a  67 y.o. female with a diagnosis of Osteomyelitis Right Foot who failed conservative measures and elected for surgical management.    The risks benefits and alternatives were discussed with the patient preoperatively including but not limited to the risks of infection, bleeding, nerve injury, cardiopulmonary complications, the need for revision surgery, among others, and the patient was willing to proceed.  OPERATIVE IMPLANTS:   Implant Name Type Inv. Item Serial No. Manufacturer Lot No. LRB No. Used Action  GRAFT SKIN WND MICRO 38 - [id number] Tissue GRAFT SKIN WND MICRO 38  [employer] [id number] Right 1 Implanted    @ENCIMAGES @  OPERATIVE FINDINGS: Bone had improved petechial bleeding after debridement.  OPERATIVE PROCEDURE: Patient was brought the operating room underwent a MAC anesthetic.  After adequate levels anesthesia were obtained patient right lower extremity was prepped using DuraPrep draped into a sterile field a timeout was called.  A rondure was used to excise skin and soft tissue muscle fascia and bone across the Lisfranc complex.  There was good petechial bleeding in the bone after debridement.  The wound was irrigated with Vashe.  Kerecis micro graft 38 cm was applied to the wound this was covered by a cleanse choice wound VAC sponge this had a good suction fit patient was taken the PACU in stable condition.   DISCHARGE PLANNING:  Antibiotic duration: Continue Maxipime and will adjust antibiotics pending culture  sensitivities.  Weightbearing: Weightbearing as tolerated on the right  Pain medication: Opioid pathway  Dressing care/ Wound VAC: Continue wound VAC  Ambulatory devices: Walker  Discharge to: Discharge planning based on therapy recommendations  Follow-up: In the office 1 week post operative.

## 2022-08-30 NOTE — Anesthesia Preprocedure Evaluation (Addendum)
Anesthesia Evaluation  Patient identified by MRN, date of birth, ID band Patient awake and Patient confused    Reviewed: Allergy & Precautions, NPO status , Patient's Chart, lab work & pertinent test results  Airway Mallampati: I  TM Distance: >3 FB Neck ROM: Full    Dental  (+) Dental Advisory Given, Edentulous Upper, Edentulous Lower   Pulmonary former smoker   Pulmonary exam normal breath sounds clear to auscultation       Cardiovascular negative cardio ROS Normal cardiovascular exam Rhythm:Regular Rate:Normal     Neuro/Psych  Headaches PSYCHIATRIC DISORDERS Anxiety Depression Bipolar Disorder  Dementia  Neuromuscular disease CVA    GI/Hepatic Neg liver ROS,GERD  Medicated,,  Endo/Other   Hyperthyroidism   Renal/GU negative Renal ROS     Musculoskeletal  (+) Arthritis ,    Abdominal   Peds  Hematology negative hematology ROS (+)   Anesthesia Other Findings Day of surgery medications reviewed with the patient.  Reproductive/Obstetrics                             Anesthesia Physical Anesthesia Plan  ASA: 3  Anesthesia Plan: MAC   Post-op Pain Management: Tylenol PO (pre-op)*   Induction: Intravenous  PONV Risk Score and Plan: 2 and Ondansetron, Dexamethasone and TIVA  Airway Management Planned: Natural Airway and Simple Face Mask  Additional Equipment:   Intra-op Plan:   Post-operative Plan:   Informed Consent: I have reviewed the patients History and Physical, chart, labs and discussed the procedure including the risks, benefits and alternatives for the proposed anesthesia with the patient or authorized representative who has indicated his/her understanding and acceptance.   Patient has DNR.  Discussed DNR with patient and Suspend DNR.   Dental advisory given and Consent reviewed with POA  Plan Discussed with: CRNA  Anesthesia Plan Comments: (Telephone consent with  patient's brother)        Anesthesia Quick Evaluation

## 2022-08-31 LAB — BASIC METABOLIC PANEL
Anion gap: 7 (ref 5–15)
BUN: 26 mg/dL — ABNORMAL HIGH (ref 8–23)
CO2: 31 mmol/L (ref 22–32)
Calcium: 9.1 mg/dL (ref 8.9–10.3)
Chloride: 100 mmol/L (ref 98–111)
Creatinine, Ser: 0.55 mg/dL (ref 0.44–1.00)
GFR, Estimated: >60 mL/min (ref >60)
Glucose, Bld: 116 mg/dL — ABNORMAL HIGH (ref 70–99)
Potassium: 4.2 mmol/L (ref 3.5–5.1)
Sodium: 138 mmol/L (ref 135–145)

## 2022-08-31 MED ORDER — HYDROCODONE-ACETAMINOPHEN 5-325 MG PO TABS: 1.0000 | ORAL_TABLET | ORAL | 0 refills | Status: DC | PRN

## 2022-08-31 MED ORDER — CIPROFLOXACIN HCL 500 MG PO TABS: 500.0000 mg | ORAL_TABLET | Freq: Two times a day (BID) | ORAL | 0 refills | Status: DC

## 2022-08-31 NOTE — Progress Notes (Signed)
Discharge summary (AVS) packet provided to pt/Steven Lovings(son) with instructions. Pt/son verbalized understanding of instructions , No complaints. Pt d/c to home as ordered. Pt's son is responsible for her ride.

## 2022-08-31 NOTE — Progress Notes (Signed)
Physical Therapy Treatment Patient Details Name: Cassidy Smith MRN: [id number] DOB: 08-26-1955 Today's Date: 08/31/2022   History of Present Illness Patient is a 67 year old woman who initially developed a pressure injury on the dorsum of her right foot from a cast for closed treatment of an ankle fracture. Patient developed a large full-thickness ulcer underwent 2 surgical debridements in the hospital. Pt is s/p R foot debridement on 8/14 and plan for additional debridement on 8/16. PMH: Bipolar 1, ischemic stroke, intracerebral hematoma, neuromuscular disorder.    PT Comments  Pt tolerated treatment well today. Pt able to transfer to chair today via stand pivot Min A HHA. Pt anticipates DC home today with HHPT. Pt has needed DME at home. PT will continue to follow if still admitted.    If plan is discharge home, recommend the following: A lot of help with walking and/or transfers;A little help with bathing/dressing/bathroom;Assistance with cooking/housework;Direct supervision/assist for medications management;Assist for transportation;Help with stairs or ramp for entrance   Can travel by private vehicle     Yes  Equipment Recommendations  None recommended by PT (Pt has needed DME)    Recommendations for Other Services       Precautions / Restrictions Precautions Precautions: Fall Restrictions Weight Bearing Restrictions: Yes RLE Weight Bearing: Weight bearing as tolerated     Mobility  Bed Mobility Overal bed mobility: Needs Assistance Bed Mobility: Supine to Sit     Supine to sit: Supervision     General bed mobility comments: Heavily reliant on momentum.    Transfers Overall transfer level: Needs assistance Equipment used: 1 person hand held assist (No RW in room) Transfers: Bed to chair/wheelchair/BSC, Sit to/from Stand Sit to Stand: Min assist Stand pivot transfers: Min assist         General transfer comment: Pt able to perform stand pivot transfer much  better today compared to previous session. Pt still not putting any weight through RLE.    Ambulation/Gait                   Stairs             Wheelchair Mobility     Tilt Bed    Modified Rankin (Stroke Patients Only)       Balance Overall balance assessment: Needs assistance Sitting-balance support: Bilateral upper extremity supported, Feet supported Sitting balance-Leahy Scale: Fair     Standing balance support: Bilateral upper extremity supported, During functional activity Standing balance-Leahy Scale: Poor Standing balance comment: Reliant on therapist                            Cognition Arousal: Alert Behavior During Therapy: WFL for tasks assessed/performed Overall Cognitive Status: No family/caregiver present to determine baseline cognitive functioning                                 General Comments: Pt was A&Ox4 however appeared to be a poor historian. Pt was previously admitted last month and history did not match up.        Exercises      General Comments General comments (skin integrity, edema, etc.): VSS      Pertinent Vitals/Pain Pain Assessment Pain Assessment: No/denies pain    Home Living  Prior Function            PT Goals (current goals can now be found in the care plan section) Progress towards PT goals: Progressing toward goals    Frequency    Min 1X/week      PT Plan      Co-evaluation              AM-PAC PT "6 Clicks" Mobility   Outcome Measure  Help needed turning from your back to your side while in a flat bed without using bedrails?: A Little Help needed moving from lying on your back to sitting on the side of a flat bed without using bedrails?: A Little Help needed moving to and from a bed to a chair (including a wheelchair)?: A Lot Help needed standing up from a chair using your arms (e.g., wheelchair or bedside chair)?: A  Lot Help needed to walk in hospital room?: Total Help needed climbing 3-5 steps with a railing? : Total 6 Click Score: 12    End of Session Equipment Utilized During Treatment: Gait belt Activity Tolerance: Patient tolerated treatment well Patient left: in chair;with call bell/phone within reach;with chair alarm set Nurse Communication: Mobility status PT Visit Diagnosis: Other abnormalities of gait and mobility (R26.89)     Time: 4098-1191 PT Time Calculation (min) (ACUTE ONLY): 14 min  Charges:    $Therapeutic Activity: 8-22 mins PT General Charges $$ ACUTE PT VISIT: 1 Visit                     Shela Nevin, PT, DPT Acute Rehab Services [phone]    Gladys Damme 08/31/2022, 10:25 AM

## 2022-08-31 NOTE — Plan of Care (Signed)
  Problem: Self-Care: Goal: Ability to meet self-care needs will improve Outcome: Progressing   Problem: Pain Management: Goal: Pain level will decrease with appropriate interventions Outcome: Progressing   Problem: Skin Integrity: Goal: Demonstration of wound healing without infection will improve Outcome: Progressing   Problem: Health Behavior/Discharge Planning: Goal: Ability to manage health-related needs will improve Outcome: Progressing   Problem: Skin Integrity: Goal: Risk for impaired skin integrity will decrease Outcome: Progressing

## 2022-08-31 NOTE — Discharge Summary (Signed)
Discharge Diagnoses:  Principal Problem:   Osteomyelitis of foot, right, acute (HCC) Active Problems:   Acute osteomyelitis of right foot (HCC)   Surgeries: Procedure(s): RIGHT FOOT DEBRIDEMENT on 08/30/2022    Consultants:   Discharged Condition: Improved  Hospital Course: Cassidy Smith is an 67 y.o. female who was admitted 08/28/2022 with a chief complaint of osteomyelitis right foot, with a final diagnosis of Osteomyelitis Right Foot.  Patient was brought to the operating room on 08/30/2022 and underwent Procedure(s): RIGHT FOOT DEBRIDEMENT.    Patient was given perioperative antibiotics:  Anti-infectives (From admission, onward)    Start     Dose/Rate Route Frequency Ordered Stop   08/31/22 0000  ciprofloxacin (CIPRO) 500 MG tablet        500 mg Oral 2 times daily 08/31/22 0938     08/30/22 0600  ceFAZolin (ANCEF) IVPB 2g/100 mL premix        2 g 200 mL/hr over 30 Minutes Intravenous On call to O.R. 08/29/22 1945 08/30/22 0941   08/29/22 0845  ceFEPIme (MAXIPIME) 2 g in sodium chloride 0.9 % 100 mL IVPB        2 g 200 mL/hr over 30 Minutes Intravenous Every 8 hours 08/29/22 0748     08/28/22 1600  ceFAZolin (ANCEF) IVPB 2g/100 mL premix  Status:  Discontinued        2 g 200 mL/hr over 30 Minutes Intravenous Every 8 hours 08/28/22 1511 08/29/22 0732   08/28/22 1259  vancomycin (VANCOCIN) powder  Status:  Discontinued          As needed 08/28/22 1300 08/28/22 1324   08/28/22 1045  ceFAZolin (ANCEF) IVPB 2g/100 mL premix        2 g 200 mL/hr over 30 Minutes Intravenous On call to O.R. 08/28/22 1032 08/28/22 1309     .  Patient was given sequential compression devices, early ambulation, and aspirin for DVT prophylaxis.  Recent vital signs: Patient Vitals for the past 24 hrs:  BP Temp Temp src Pulse Resp SpO2  08/31/22 0754 120/69 98.8 F (37.1 C) Oral 71 17 92 %  08/31/22 0420 100/61 98.8 F (37.1 C) -- 81 20 95 %  08/30/22 2033 (!) 133/116 98.8 F (37.1 C) -- 77 17  91 %  08/30/22 1436 118/66 97.6 F (36.4 C) Oral 74 -- 90 %  08/30/22 1117 (!) 132/120 -- -- 83 -- 96 %  08/30/22 1100 130/66 -- -- 71 12 100 %  08/30/22 1045 115/68 -- -- 72 20 93 %  08/30/22 1030 109/65 -- -- 75 19 93 %  08/30/22 1015 104/87 -- -- 77 13 95 %  08/30/22 1010 109/84 (!) 97.2 F (36.2 C) -- 75 11 98 %  .  Recent laboratory studies: No results found.  Discharge Medications:   Allergies as of 08/31/2022   No Known Allergies      Medication List     TAKE these medications    acetaminophen-codeine 300-30 MG tablet Commonly known as: TYLENOL #3 Take 1 tablet by mouth every 6 (six) hours as needed for moderate pain.   aspirin EC 81 MG tablet Take 1 tablet (81 mg total) by mouth daily with breakfast. Swallow whole.   atorvastatin 20 MG tablet Commonly known as: LIPITOR Take 1 tablet (20 mg total) by mouth daily at 6 PM.   B-12 5000 MCG Caps Take 5,000 mcg by mouth daily.   benztropine 1 MG tablet Commonly known as: COGENTIN Take 1 tablet (1  mg total) by mouth 2 (two) times daily.   Calmoseptine 0.44-20.6 % Oint Generic drug: Menthol-Zinc Oxide Apply 1 Application topically daily as needed (bed sores).   ciprofloxacin 500 MG tablet Commonly known as: Cipro Take 1 tablet (500 mg total) by mouth 2 (two) times daily.   divalproex 500 MG 24 hr tablet Commonly known as: DEPAKOTE ER Take 2 tablets (1,000 mg total) by mouth at bedtime.   doxycycline 100 MG tablet Commonly known as: VIBRA-TABS Take 1 tablet (100 mg total) by mouth 2 (two) times daily.   furosemide 20 MG tablet Commonly known as: LASIX Take 20 mg by mouth daily.   gabapentin 100 MG capsule Commonly known as: NEURONTIN Take 1 capsule (100 mg total) by mouth 3 (three) times daily. What changed:  how much to take when to take this additional instructions   HYDROcodone-acetaminophen 5-325 MG tablet Commonly known as: NORCO/VICODIN Take 1 tablet by mouth every 4 (four) hours as  needed. What changed:  when to take this reasons to take this   ipratropium-albuterol 0.5-2.5 (3) MG/3ML Soln Commonly known as: DUONEB Take 3 mLs by nebulization every 6 (six) hours as needed.   methimazole 10 MG tablet Commonly known as: TAPAZOLE Take 10 mg by mouth every other day.   multivitamin with minerals Tabs tablet Take 1 tablet by mouth daily. Centrum Silver   pantoprazole 40 MG tablet Commonly known as: PROTONIX Take 40 mg by mouth daily.   potassium chloride 10 MEQ tablet Commonly known as: KLOR-CON Take 10 mEq by mouth daily.   traZODone 100 MG tablet Commonly known as: DESYREL Take 100 mg by mouth at bedtime.   Vitamin D3 50 MCG (2000 UT) capsule Take 2,000 Units by mouth daily.               Discharge Care Instructions  (From admission, onward)           Start     Ordered   08/31/22 0000  Weight bearing as tolerated       Question Answer Comment  Laterality right   Extremity Lower      08/31/22 0938            Diagnostic Studies: No results found.  Patient benefited maximally from their hospital stay and there were no complications.     Disposition: Discharge disposition: 01-Home or Self Care      Discharge Instructions     Call MD / Call 911   Complete by: As directed    If you experience chest pain or shortness of breath, CALL 911 and be transported to the hospital emergency room.  If you develope a fever above 101 F, pus (white drainage) or increased drainage or redness at the wound, or calf pain, call your surgeon's office.   Constipation Prevention   Complete by: As directed    Drink plenty of fluids.  Prune juice may be helpful.  You may use a stool softener, such as Colace (over the counter) 100 mg twice a day.  Use MiraLax (over the counter) for constipation as needed.   Diet - low sodium heart healthy   Complete by: As directed    Increase activity slowly as tolerated   Complete by: As directed    Negative  Pressure Wound Therapy - Incisional   Complete by: As directed    Attached the wound VAC dressing to the Praveena plus portable wound VAC pump for discharge.   Post-op shoe   Complete by: As  directed    Post-operative opioid taper instructions:   Complete by: As directed    POST-OPERATIVE OPIOID TAPER INSTRUCTIONS: It is important to wean off of your opioid medication as soon as possible. If you do not need pain medication after your surgery it is ok to stop day one. Opioids include: Codeine, Hydrocodone(Norco, Vicodin), Oxycodone(Percocet, oxycontin) and hydromorphone amongst others.  Long term and even short term use of opiods can cause: Increased pain response Dependence Constipation Depression Respiratory depression And more.  Withdrawal symptoms can include Flu like symptoms Nausea, vomiting And more Techniques to manage these symptoms Hydrate well Eat regular healthy meals Stay active Use relaxation techniques(deep breathing, meditating, yoga) Do Not substitute Alcohol to help with tapering If you have been on opioids for less than two weeks and do not have pain than it is ok to stop all together.  Plan to wean off of opioids This plan should start within one week post op of your joint replacement. Maintain the same interval or time between taking each dose and first decrease the dose.  Cut the total daily intake of opioids by one tablet each day Next start to increase the time between doses. The last dose that should be eliminated is the evening dose.      Weight bearing as tolerated   Complete by: As directed    Laterality: right   Extremity: Lower       Follow-up Information     [name], MD Follow up in 1 week(s).   Specialty: Orthopedic Surgery Contact information: [address] [place] [address] [phone]         Toma Deiters, MD Follow up.   Specialty: Internal Medicine Contact information: [address] [place]  [phone]                  Signed: [name] 08/31/2022, 9:39 AM

## 2022-08-31 NOTE — Plan of Care (Signed)
  Problem: Education: Goal: Knowledge of General Education information will improve Description: Including pain rating scale, medication(s)/side effects and non-pharmacologic comfort measures Outcome: Progressing   Problem: Health Behavior/Discharge Planning: Goal: Ability to manage health-related needs will improve Outcome: Progressing   Problem: Activity: Goal: Risk for activity intolerance will decrease Outcome: Progressing   Problem: Nutrition: Goal: Adequate nutrition will be maintained Outcome: Progressing   Problem: Coping: Goal: Level of anxiety will decrease Outcome: Progressing   Problem: Elimination: Goal: Will not experience complications related to bowel motility Outcome: Progressing   Problem: Pain Managment: Goal: General experience of comfort will improve Outcome: Progressing   

## 2022-08-31 NOTE — Anesthesia Postprocedure Evaluation (Signed)
Anesthesia Post Note  Patient: Cassidy Smith  Procedure(s) Performed: RIGHT FOOT DEBRIDEMENT (Right)     Patient location during evaluation: PACU Anesthesia Type: MAC Level of consciousness: awake and alert Pain management: pain level controlled Vital Signs Assessment: post-procedure vital signs reviewed and stable Respiratory status: spontaneous breathing, nonlabored ventilation, respiratory function stable and patient connected to nasal cannula oxygen Cardiovascular status: stable and blood pressure returned to baseline Postop Assessment: no apparent nausea or vomiting Anesthetic complications: no   No notable events documented.  Last Vitals:  Vitals:   08/31/22 0420 08/31/22 0754  BP: 100/61 120/69  Pulse: 81 71  Resp: 20 17  Temp: 37.1 C 37.1 C  SpO2: 95% 92%    Last Pain:  Vitals:   08/31/22 0754  TempSrc: Oral  PainSc:                  Collene Schlichter

## 2022-09-01 ENCOUNTER — Encounter (HOSPITAL_COMMUNITY): Payer: Self-pay | Admitting: Orthopedic Surgery

## 2022-09-02 LAB — AEROBIC/ANAEROBIC CULTURE W GRAM STAIN (SURGICAL/DEEP WOUND): Gram Stain: NONE SEEN

## 2022-09-02 NOTE — Consult Note (Signed)
   [facility] Inpatient Consult   09/02/2022  MAIANH MALONSON 1955/09/20 [id number]  [facility] [[facility]]  Accountable Care Organization [ACO] Patient: Medicare ACO REACH  Primary Care Provider:  Toma Deiters, MD with [facility] Internal Medicine which is listed to provide the [facility] follow up   Patient is currently active with [facility] [[facility] Care Management for chronic disease management services.  Patient has been engaged by a Airline pilot and with upcoming appointment. Patient transitioned home with [facility]  Our community based plan of care has focused on disease management and community resource support.    Plan: Patient will receive a post hospital call and will be evaluated for assessments and disease process education.  Will update RN CC of admission less than 30 days  Of note, [facility] Care Management services does not replace or interfere with any services that are needed or arranged by inpatient [facility] care management team.   For additional questions or referrals please contact:  Charlesetta Shanks, RN BSN CCM [facility]  [phone] business mobile phone Toll free office [phone]  *Concierge Line  [phone] Fax number: [phone] [email or web address]     .

## 2022-09-03 ENCOUNTER — Ambulatory Visit: Payer: Self-pay | Admitting: *Deleted

## 2022-09-03 ENCOUNTER — Encounter (HOSPITAL_COMMUNITY): Payer: Self-pay | Admitting: Orthopedic Surgery

## 2022-09-03 NOTE — Patient Outreach (Addendum)
  Care Coordination   Follow Up Visit Note   09/03/2022 Name: Cassidy Smith MRN: [id number] DOB: Feb 14, 1955  Cassidy Smith is a 67 y.o. year old female who sees Hasanaj, Myra Gianotti, MD for primary care. I  spoke with brother, Cassidy Smith, by telephone today. He is Cassidy Smith's primary caregiver. Next appointment has been scheduled for a time when he will be with [facility].  What matters to the patients health and wellness today? Right foot wound healing    Goals Addressed             This Visit's Progress    Right Ankle Fracture and Wound Healing   On track    Care Coordination Goals: Patient/brother will monitor drain for any changes in clarity, color, consistency, or odor and will notify orthopedic surgeon if any changes are noted Patient will begin weight barring as tolerated and slowly increase activity level as tolerated, using walker, per discharge instructions on 08/31/22 Patient/family will reach out to RN Care Coordinator at [phone] with any resource or care coordination needs        SDOH assessments and interventions completed:  Yes  SDOH Interventions Today    Flowsheet Row Most Recent Value  SDOH Interventions   Housing Interventions Intervention Not Indicated  Transportation Interventions Patient Resources (Friends/Family)  Physical Activity Interventions Other (Comments)  [Encouraged to increase activity as tolerated per most recent hospital discharge instructions]        Care Coordination Interventions:  Yes, provided  Interventions Today    Flowsheet Row Most Recent Value  Chronic Disease   Chronic disease during today's visit Other  [Closed fracture of right ankle s/p fall on 04/11/22]  General Interventions   General Interventions Discussed/Reviewed General Interventions Discussed, General Interventions Reviewed, Durable Medical Equipment (DME), Doctor Visits  Doctor Visits Discussed/Reviewed Doctor Visits Discussed, Doctor Visits Reviewed, Specialist  [reviewed  and discussed hospitalization from 8/14-8/17 for Osteomyelitis of right foot.]  Durable Medical Equipment (DME) Dan Humphreys, Wheelchair  [Praveena plus portable wound VAC pump with dressing attached to top of right foot for discharge. Has standard walker and post op shoe. Drainage is clear and not cloudy.]  Wheelchair Standard  PCP/Specialist Visits Compliance with follow-up visit  [orthopedic surgeon 09/11/22. Brother will provide transportation.]  Exercise Interventions   Exercise Discussed/Reviewed Exercise Discussed, Exercise Reviewed, Physical Activity, Assistive device use and maintanence  Physical Activity Discussed/Reviewed Physical Activity Discussed, Physical Activity Reviewed  [HHPT twice a week. Encouraged weight bearing as tolerated & to slowly increase activity level as tolerated per dishcarge instructions from 08/31/22. Pt is sitting or lying for most of the day. Cautioned that prolonged sitting will cause deconditioning.]  Education Interventions   Education Provided Provided Education  Provided Verbal Education On When to see the doctor, Medication, Exercise  [monitor for signs/symptoms of infection: pain, swelling, cloudy drianage, odor. Wound dressing is still intact from discharge on 08/31/22. Should remain intact until post-op follow-up unless soiled. Call orthopedic surgeon with concerns.]  Pharmacy Interventions   Pharmacy Dicussed/Reviewed Pharmacy Topics Discussed, Pharmacy Topics Reviewed, Medications and their functions  [Per brother, pain is minimal. Opiod taper discussed. Antibiotic use discussed.]  Safety Interventions   Safety Discussed/Reviewed Safety Discussed, Safety Reviewed, Fall Risk, Home Safety  Home Safety Assistive Devices  [use walker for balance to decrease pressure on right foot]       Follow up plan: Follow up call scheduled for 09/17/22    Encounter Outcome:  Pt. Visit Completed

## 2022-09-06 ENCOUNTER — Encounter: Payer: Medicare Other | Admitting: Family

## 2022-09-11 ENCOUNTER — Encounter: Payer: Self-pay | Admitting: Family

## 2022-09-11 ENCOUNTER — Ambulatory Visit (INDEPENDENT_AMBULATORY_CARE_PROVIDER_SITE_OTHER): Payer: Medicare Other | Admitting: Family

## 2022-09-11 DIAGNOSIS — L89896 Pressure-induced deep tissue damage of other site: Secondary | ICD-10-CM

## 2022-09-11 NOTE — Progress Notes (Signed)
Post-Op Visit Note   Patient: Cassidy Smith           Date of Birth: 02-Sep-1955           MRN: [id number] Visit Date: 09/11/2022 PCP: Toma Deiters, MD  Chief Complaint:  Chief Complaint  Patient presents with   Right Foot - Routine Post Op    08/30/2022 right foot debridement     HPI:  HPI The patient is a 67 year old woman who is seen status post repeat debridement of the right foot with wound VAC placement wound VAC was removed today.  Ortho Exam On examination of the right foot the dorsal ulcer is filled in with 100% granulation which is bleeding today there is no surrounding maceration or erythema.  Visit Diagnoses: No diagnosis found.  Plan: Will begin daily dose of cleansing dry dressings minimize weightbearing.  Plan to apply Kerecis micro graft at next visit in 2 weeks  Follow-Up Instructions: No follow-ups on file.   Imaging: No results found.  Orders:  No orders of the defined types were placed in this encounter.  No orders of the defined types were placed in this encounter.    PMFS History: Patient Active Problem List   Diagnosis Date Noted   Acute osteomyelitis of right foot (HCC) 08/28/2022   Osteomyelitis of foot, right, acute (HCC) 08/28/2022   Cutaneous abscess of right foot 07/31/2022   Abscess of right foot 07/31/2022   Acute heart failure with preserved ejection fraction (HFpEF) (HCC) 02/20/2022   Acute respiratory failure with hypoxia (HCC) 02/20/2022   AKI (acute kidney injury) (HCC) 02/15/2022   Aspiration pneumonia (HCC) 02/15/2022   Leukocytosis 02/15/2022   Falls frequently 02/15/2022   SIRS (systemic inflammatory response syndrome) (HCC) 08/12/2020   Mild protein malnutrition (HCC) 08/12/2020   Sepsis (HCC) 07/25/2020   Sepsis with acute hypoxic respiratory failure without septic shock (HCC)    Dementia without behavioral disturbance (HCC)    Palliative care by specialist    Goals of care, counseling/discussion    Pneumonia of  right lower lobe due to infectious organism 11/06/2019   Intracerebral hematoma (HCC) 07/04/2019   Closed head injury 07/03/2019   Thrombocytopenia (HCC) 07/03/2019   Thyrotoxicosis, unspecified with thyrotoxic crisis or storm 01/15/2019   History of syndrome of inappropriate antidiuretic hormone (SIADH) 01/15/2019   UTI (urinary tract infection) 01/04/2018   Neuromuscular disorder (HCC)    Dysuria 10/30/2017   Fever 08/15/2017   Hypokalemia 08/15/2017   Altered mental state 08/15/2017   Acute metabolic encephalopathy 08/15/2017   Valproic acid toxicity 08/15/2017   Rectal cancer (HCC) 07/10/2017   Chronic deep vein thrombosis (DVT) of distal vein of lower extremity (HCC) 07/10/2017   Altered mental status    Ischemic stroke (HCC)    GERD (gastroesophageal reflux disease) 05/12/2017   GI bleed    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   Dysplastic rectal polyp    Protein-calorie malnutrition, severe 04/07/2017   Pressure injury of skin 04/04/2017   Anticoagulated 04/03/2017   Stool bloody 04/03/2017   Current every day smoker 04/03/2017   Rectal bleeding    Chronic diarrhea    Hyperthyroidism    S/P ORIF (open reduction internal fixation) fracture right hip IM nail 03/07/17 03/24/2017   Hypomagnesemia 03/07/2017   Fall    Closed intertrochanteric fracture of hip, right, initial encounter (HCC) 03/06/2017   Radial styloid fracture: right 03/06/2017   Normocytic anemia 03/06/2017   Orthostatic hypotension 06/07/2015  Lower urinary tract infectious disease 06/07/2015   Rhabdomyolysis 06/07/2015   White matter abnormality on MRI of brain 05/14/2013   History of depression 05/14/2013   Hx of anxiety disorder 05/14/2013   Bipolar I disorder, most recent episode (or current) manic (HCC) 05/14/2013   Bipolar 1 disorder (HCC) 05/14/2013   Hyponatremia 05/12/2013   Lacunar infarct, acute (HCC) 05/12/2013   Past Medical History:  Diagnosis Date    Anxiety    Arthritis    Bipolar 1 disorder (HCC)    Chronic deep vein thrombosis (DVT) of distal vein of lower extremity (HCC) 07/10/2017   Chronic hip pain    Depression    GI bleed    Headache(784.0)    History of UTI/bladder spams.    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   Thyrotoxicosis, unspecified with thyrotoxic crisis or storm 01/15/2019    Family History  Problem Relation Age of Onset   Breast cancer Mother    Cancer - Other Mother    Alcoholism Father    Colon cancer Neg Hx    Colon polyps Neg Hx     Past Surgical History:  Procedure Laterality Date   COLONOSCOPY WITH PROPOFOL N/A 04/05/2017   Procedure: COLONOSCOPY WITH PROPOFOL;  Surgeon: Malissa Hippo, MD;  [facility];  Service: Endoscopy;  Laterality: N/A;   I & D EXTREMITY Right 07/31/2022   Procedure: RIGHT FOOT DEBRIDEMENT AND TISSUE GRAFT;  Surgeon: [name], MD;  Location: [facility] OR;  Service: Orthopedics;  Laterality: Right;   I & D EXTREMITY Right 08/03/2022   Procedure: RIGHT FOOT DEBRIDEMENT;  Surgeon: [name], MD;  Location: [facility] OR;  Service: Orthopedics;  Laterality: Right;   I & D EXTREMITY Right 08/28/2022   Procedure: RIGHT FOOT DEBRIDEMENT;  Surgeon: [name], MD;  Location[facility] OR;  Service: Orthopedics;  Laterality: Right;   I & D EXTREMITY Right 08/30/2022   Procedure: RIGHT FOOT DEBRIDEMENT;  Surgeon: [name], MD;  Location: [facility] OR;  Service: Orthopedics;  Laterality: Right;   INTRAMEDULLARY (IM) NAIL INTERTROCHANTERIC Right 03/07/2017   Procedure: OPEN TREATMENT INTERNAL FIXATION RIGHT HIP WITH GAMA INTRAMEDULARY  NAIL;  Surgeon: Vickki Hearing, MD;  Location: [facility];  Service: Orthopedics;  Laterality: Right;   MULTIPLE EXTRACTIONS WITH ALVEOLOPLASTY N/A 10/30/2012   Procedure: MULTIPLE EXTRACION 5, 6, 8, 9, 10 ,18, 19, 31 WITH MAXILLARY RIGHT AND LEFT  ALVEOLOPLASTY REDUCE MAXILLARY LEFT TUBEROSITY;  Surgeon: Georgia Lopes, DDS;  Location: [facility] OR;  Service: Oral Surgery;  Laterality: N/A;   POLYPECTOMY  04/05/2017   Procedure: POLYPECTOMY;  Surgeon: Malissa Hippo, MD;  [facility];  Service: Endoscopy;;  recto-sigmoid, rectum   Social History   Occupational History   Not on file  Tobacco Use   Smoking status: Former    Current packs/day: 0.00    Average packs/day: 0.5 packs/day for 20.0 years (10.0 ttl pk-yrs)    Types: Cigarettes    Start date: 05/05/1997    Quit date: 05/05/2017    Years since quitting: 5.3   Smokeless tobacco: Never   Tobacco  comments:    patient states she quit one week ago  Vaping Use   Vaping status: Never Used  Substance and Sexual Activity   Alcohol use: No   Drug use: No   Sexual activity: Not on file

## 2022-09-17 ENCOUNTER — Ambulatory Visit: Payer: Self-pay | Admitting: *Deleted

## 2022-09-17 NOTE — Patient Outreach (Signed)
  Care Coordination   09/17/2022 Name: Cassidy Smith MRN: [id number] DOB: [birth year]   Care Coordination Outreach Attempts:  An unsuccessful telephone outreach was attempted for a scheduled appointment today.  Follow Up Plan:  Additional outreach attempts will be made to offer the patient care coordination information and services.   Encounter Outcome:  No Answer   Care Coordination Interventions:  No, not indicated    Demetrios Loll, RN, BSN Care Management Coordinator [facility]  [facility] Direct Dial: [phone] Main #: [phone]

## 2022-09-20 ENCOUNTER — Telehealth: Payer: Self-pay | Admitting: *Deleted

## 2022-09-20 NOTE — Progress Notes (Unsigned)
  Care Coordination Note  09/20/2022 Name: Cassidy Smith MRN: [id number] DOB: 1955/08/03  Cassidy Smith is a 67 y.o. year old female who is a primary care patient of Hasanaj, Myra Gianotti, MD and is actively engaged with the care management team. I reached out to Cassidy Smith by phone today to assist with re-scheduling a follow up visit with the RN Case Manager  Follow up plan: Unsuccessful telephone outreach attempt made.   [facility]  Care Coordination Care Guide  Direct Dial: [phone]

## 2022-09-23 NOTE — Progress Notes (Signed)
  Care Coordination Note  09/23/2022 Name: Cassidy Smith MRN: [id number] DOB: [birth year]  Cassidy Smith is a 67 y.o. year old female who is a primary care patient of Hasanaj, Myra Gianotti, MD and is actively engaged with the care management team. I reached out to Cassidy Smith by phone today to assist with re-scheduling a follow up visit with the RN Case Manager  Follow up plan: Telephone appointment with care management team member scheduled for:10/03/22  [facility] Coordination Care Guide  Direct Dial: [phone]

## 2022-09-25 ENCOUNTER — Encounter: Payer: Self-pay | Admitting: Family

## 2022-09-25 ENCOUNTER — Ambulatory Visit (INDEPENDENT_AMBULATORY_CARE_PROVIDER_SITE_OTHER): Payer: Medicare Other | Admitting: Family

## 2022-09-25 DIAGNOSIS — L02611 Cutaneous abscess of right foot: Secondary | ICD-10-CM

## 2022-09-25 DIAGNOSIS — L89896 Pressure-induced deep tissue damage of other site: Secondary | ICD-10-CM

## 2022-09-25 MED ORDER — SILVER SULFADIAZINE 1 % EX CREA: 1.0000 | TOPICAL_CREAM | Freq: Every day | CUTANEOUS | 0 refills | Status: DC

## 2022-09-25 NOTE — Progress Notes (Signed)
Post-Op Visit Note   Patient: Cassidy Smith           Date of Birth: [birth year]           MRN: [id number] Visit Date: 09/25/2022 PCP: Toma Deiters, MD  Chief Complaint:  Chief Complaint  Patient presents with   Right Foot - Routine Post Op    08/30/2022 right foot debridement    HPI:  HPI The patient is a 67 year old woman who is seen status post repeat debridement of the right foot with wound VAC placement  Has been doing dry dressing changes for last 2 weeks.  Ortho Exam On examination of the right foot the dorsal ulcer is filled in with 100% granulation after debridement of nonviable tissue. Is 1 cm deep. Scant serosanguinous drainage. there is no surrounding maceration or erythema.  Visit Diagnoses: No diagnosis found.  Plan: Begin daily dose of cleansing.  Pack open wounds Silvadene and gauze.  Ace wrap for compression.  Follow-up in the office in 2 weeks.   Follow-Up Instructions: Return in about 2 weeks (around 10/09/2022).   Imaging: No results found.  Orders:  No orders of the defined types were placed in this encounter.  No orders of the defined types were placed in this encounter.    PMFS History: Patient Active Problem List   Diagnosis Date Noted   Acute osteomyelitis of right foot (HCC) 08/28/2022   Osteomyelitis of foot, right, acute (HCC) 08/28/2022   Cutaneous abscess of right foot 07/31/2022   Abscess of right foot 07/31/2022   Acute heart failure with preserved ejection fraction (HFpEF) (HCC) 02/20/2022   Acute respiratory failure with hypoxia (HCC) 02/20/2022   AKI (acute kidney injury) (HCC) 02/15/2022   Aspiration pneumonia (HCC) 02/15/2022   Leukocytosis 02/15/2022   Falls frequently 02/15/2022   SIRS (systemic inflammatory response syndrome) (HCC) 08/12/2020   Mild protein malnutrition (HCC) 08/12/2020   Sepsis (HCC) 07/25/2020   Sepsis with acute hypoxic respiratory failure without septic shock (HCC)    Dementia without behavioral  disturbance (HCC)    Palliative care by specialist    Goals of care, counseling/discussion    Pneumonia of right lower lobe due to infectious organism 11/06/2019   Intracerebral hematoma (HCC) 07/04/2019   Closed head injury 07/03/2019   Thrombocytopenia (HCC) 07/03/2019   Thyrotoxicosis, unspecified with thyrotoxic crisis or storm 01/15/2019   History of syndrome of inappropriate antidiuretic hormone (SIADH) 01/15/2019   UTI (urinary tract infection) 01/04/2018   Neuromuscular disorder (HCC)    Dysuria 10/30/2017   Fever 08/15/2017   Hypokalemia 08/15/2017   Altered mental state 08/15/2017   Acute metabolic encephalopathy 08/15/2017   Valproic acid toxicity 08/15/2017   Rectal cancer (HCC) 07/10/2017   Chronic deep vein thrombosis (DVT) of distal vein of lower extremity (HCC) 07/10/2017   Altered mental status    Ischemic stroke (HCC)    GERD (gastroesophageal reflux disease) 05/12/2017   GI bleed    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   Dysplastic rectal polyp    Protein-calorie malnutrition, severe 04/07/2017   Pressure injury of skin 04/04/2017   Anticoagulated 04/03/2017   Stool bloody 04/03/2017   Current every day smoker 04/03/2017   Rectal bleeding    Chronic diarrhea    Hyperthyroidism    S/P ORIF (open reduction internal fixation) fracture right hip IM nail 03/07/17 03/24/2017   Hypomagnesemia 03/07/2017   Fall    Closed intertrochanteric fracture of hip, right, initial encounter (HCC) 03/06/2017  Radial styloid fracture: right 03/06/2017   Normocytic anemia 03/06/2017   Orthostatic hypotension 06/07/2015   Lower urinary tract infectious disease 06/07/2015   Rhabdomyolysis 06/07/2015   White matter abnormality on MRI of brain 05/14/2013   History of depression 05/14/2013   Hx of anxiety disorder 05/14/2013   Bipolar I disorder, most recent episode (or current) manic (HCC) 05/14/2013   Bipolar 1 disorder (HCC) 05/14/2013    Hyponatremia 05/12/2013   Lacunar infarct, acute (HCC) 05/12/2013   Past Medical History:  Diagnosis Date   Anxiety    Arthritis    Bipolar 1 disorder (HCC)    Chronic deep vein thrombosis (DVT) of distal vein of lower extremity (HCC) 07/10/2017   Chronic hip pain    Depression    GI bleed    Headache(784.0)    History of UTI/bladder spams.    Hyperthyroidism    Hypokalemia 08/15/2017   Intracerebral hematoma (HCC) 07/04/2019   Ischemic stroke (HCC)    Neuromuscular disorder (HCC)    shaking of hands    Pneumonia of right lower lobe due to infectious organism 11/06/2019   Psychosis [facility])    Radial styloid fracture: right 03/06/2017   Rhabdomyolysis 06/07/2015   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   Thyrotoxicosis, unspecified with thyrotoxic crisis or storm 01/15/2019    Family History  Problem Relation Age of Onset   Breast cancer Mother    Cancer - Other Mother    Alcoholism Father    Colon cancer Neg Hx    Colon polyps Neg Hx     Past Surgical History:  Procedure Laterality Date   COLONOSCOPY WITH PROPOFOL N/A 04/05/2017   Procedure: COLONOSCOPY WITH PROPOFOL;  Surgeon: Malissa Hippo, MD;  [facility];  Service: Endoscopy;  Laterality: N/A;   I & D EXTREMITY Right 07/31/2022   Procedure: RIGHT FOOT DEBRIDEMENT AND TISSUE GRAFT;  Surgeon: [name], MD;  Location: [facility] OR;  Service: Orthopedics;  Laterality: Right;   I & D EXTREMITY Right 08/03/2022   Procedure: RIGHT FOOT DEBRIDEMENT;  Surgeon: [name], MD;  Location: [facility] OR;  Service: Orthopedics;  Laterality: Right;   I & D EXTREMITY Right 08/28/2022   Procedure: RIGHT FOOT DEBRIDEMENT;  Surgeon: [name], MD;  Location: [facility] OR;  Service: Orthopedics;  Laterality: Right;   I & D EXTREMITY Right 08/30/2022   Procedure: RIGHT FOOT DEBRIDEMENT;  Surgeon: [name], MD;  Location: [facility] OR;  Service: Orthopedics;  Laterality: Right;   INTRAMEDULLARY (IM) NAIL  INTERTROCHANTERIC Right 03/07/2017   Procedure: OPEN TREATMENT INTERNAL FIXATION RIGHT HIP WITH GAMA INTRAMEDULARY NAIL;  Surgeon: Vickki Hearing, MD;  Location: [facility];  Service: Orthopedics;  Laterality: Right;   MULTIPLE EXTRACTIONS WITH ALVEOLOPLASTY N/A 10/30/2012   Procedure: MULTIPLE EXTRACION 5, 6, 8, 9, 10 ,18, 19, 31 WITH MAXILLARY RIGHT AND LEFT  ALVEOLOPLASTY REDUCE MAXILLARY LEFT TUBEROSITY;  Surgeon: Georgia Lopes, DDS;  Location: [facility] OR;  Service: Oral Surgery;  Laterality: N/A;   POLYPECTOMY  04/05/2017   Procedure: POLYPECTOMY;  Surgeon: Malissa Hippo, MD;  [facility];  Service: Endoscopy;;  recto-sigmoid, rectum   Social History   Occupational History   Not on file  Tobacco Use   Smoking status: Former    Current packs/day: 0.00    Average packs/day: 0.5 packs/day for 20.0 years (10.0 ttl pk-yrs)    Types: Cigarettes    Start date: 05/05/1997    Quit  date: 05/05/2017    Years since quitting: 5.3   Smokeless tobacco: Never   Tobacco comments:    patient states she quit one week ago  Vaping Use   Vaping status: Never Used  Substance and Sexual Activity   Alcohol use: No   Drug use: No   Sexual activity: Not on file

## 2022-10-01 ENCOUNTER — Telehealth: Payer: Self-pay | Admitting: Family

## 2022-10-01 NOTE — Telephone Encounter (Signed)
[facility] called in to see if we can give a verbal order for assessment pt brother feels like pt needs PT and is high fall risk please advise call Marisol [phone], Molli Knock to leave VM

## 2022-10-01 NOTE — Telephone Encounter (Signed)
I called and lm on vm for verbal ok PT orders below. Eval and treat safe at home, balance and strengthening wtbat on right still has open wound s/p debridement last month

## 2022-10-03 ENCOUNTER — Telehealth: Payer: Self-pay | Admitting: Family

## 2022-10-03 ENCOUNTER — Ambulatory Visit: Payer: Self-pay | Admitting: *Deleted

## 2022-10-03 NOTE — Telephone Encounter (Signed)
I called and lm on vm to advise verbal ok for PT and to call the office if there is anything else specific that she is needed.

## 2022-10-03 NOTE — Telephone Encounter (Signed)
Misty Stanley from [facility] needs to write an order but needs to know if she can come tomorrow for PT. She just needs the the ok. [phone]

## 2022-10-04 ENCOUNTER — Encounter: Payer: Self-pay | Admitting: *Deleted

## 2022-10-04 NOTE — Patient Outreach (Signed)
Care Coordination   Follow Up Visit Note   10/03/2022 Name: Cassidy Smith MRN: [id number] DOB: [birth year]  Cassidy Smith is a 67 y.o. year old female who sees Hasanaj, Myra Gianotti, MD for primary care. I  spoke with patient's brother Cassidy Smith by telephone, with patient present in the car with him. [name] has been diagnosed with dementia and her brothers assist in her care.   What matters to the patients health and wellness today?  Working with HHPT and wound healing.     Goals Addressed             This Visit's Progress    Right Ankle Fracture and Wound Healing   On track    Care Coordination Goals: Patient/brother will monitor wound for any signs/symptoms of infection and will notify orthopedic provider if any are noted Patient will begin working with HHPT for strength and gait training Keep follow-up appointment with Cassidy Del, NP [facility] on 10/09/22 Patient/family will reach out to RN Care Coordinator at [phone] with any resource or care coordination needs        SDOH assessments and interventions completed:  Yes SDOH Interventions Today    Flowsheet Row Most Recent Value  SDOH Interventions   Transportation Interventions Patient Resources (Friends/Family)  Physical Activity Interventions Other (Comments)  [Will start working with HHPT]       Care Coordination Interventions:  Yes, provided  Interventions Today    Flowsheet Row Most Recent Value  Chronic Disease   Chronic disease during today's visit Other  [Dementia, Abcess Right Foot]  General Interventions   General Interventions Discussed/Reviewed General Interventions Discussed, General Interventions Reviewed, Doctor Visits  Doctor Visits Discussed/Reviewed Doctor Visits Discussed, Doctor Visits Reviewed, PCP, Specialist  [facility] PCP visit this morning. orthopedic visit on 09/25/22. Granulation tissue present and wound is getting smaller.]  PCP/Specialist Visits Compliance with follow-up visit   [10/09/22 Cassidy Del, NP [facility]]  Exercise Interventions   Exercise Discussed/Reviewed Physical Activity  Physical Activity Discussed/Reviewed Physical Activity Discussed, Physical Activity Reviewed  [Verbal orders for HHPT have been given by [facility]]  Education Interventions   Education Provided Provided Education  Provided Verbal Education On When to see the doctor, Foot Care, Medication  [continue wound care as directed and reach out to provider with any signs/symptoms of infection. Answered questions about wound healing and confirmed that yes, the goal is for the wound to heal from the inside out.]  Nutrition Interventions   Nutrition Discussed/Reviewed Nutrition Discussed, Nutrition Reviewed, Increasing proteins, Fluid intake, Supplemental nutrition  Pharmacy Interventions   Pharmacy Dicussed/Reviewed Pharmacy Topics Discussed, Pharmacy Topics Reviewed, Medications and their functions  Safety Interventions   Safety Discussed/Reviewed Safety Discussed, Safety Reviewed, Fall Risk, Home Safety  Home Safety Assistive Devices       Follow up plan: Follow up call scheduled for 11/04/22    Encounter Outcome:  Patient Visit Completed   Demetrios Loll, RN, BSN Care Management Coordinator [facility]  [facility] Direct Dial: [phone] Main #: [phone]

## 2022-10-09 ENCOUNTER — Ambulatory Visit (INDEPENDENT_AMBULATORY_CARE_PROVIDER_SITE_OTHER): Payer: Medicare Other | Admitting: Family

## 2022-10-09 ENCOUNTER — Encounter: Payer: Self-pay | Admitting: Family

## 2022-10-09 DIAGNOSIS — M86271 Subacute osteomyelitis, right ankle and foot: Secondary | ICD-10-CM

## 2022-10-09 MED ORDER — DOXYCYCLINE HYCLATE 100 MG PO TABS: 100.0000 mg | ORAL_TABLET | Freq: Two times a day (BID) | ORAL | 0 refills | Status: DC

## 2022-10-09 NOTE — Progress Notes (Signed)
Office Visit Note   Patient: Cassidy Smith           Date of Birth: Mar 19, 1955           MRN: [id number] Visit Date: 10/09/2022              Requested by: Toma Deiters, MD [address] [place],  [place] [address] PCP: Toma Deiters, MD  Chief Complaint  Patient presents with   Right Foot - Routine Post Op    08/30/2022 right foot debridement      HPI: The patient is a 67 year old woman who is seen in postoperative follow-up status post repeat debridement of the dorsal right foot with wound VAC placement she has been doing Silvadene dressing changes for the last 2 weeks she and her family feel that the wound is improving  Does have a history of.dementia  Assessment & Plan: Visit Diagnoses: No diagnosis found.  Plan: Bone exposure today in the dorsal ulcer wound.  She will continue on doxycycline.  They will continue with her at their current wound care conservative measures.  Have discussed possibility of amputation, transtibial amputation on the right definitive treatment for her the osteomyelitis.  At this point the patient is not in agreement she would like to pursue conservative measures  Follow-Up Instructions: Return in about 15 days (around 10/24/2022).   Ortho Exam  Patient is alert, oriented, no adenopathy, well-dressed, normal affect, normal respiratory effort. On examination right foot there is a dorsal ulcer present with surrounding edema the ulcer is 2 cm in diameter 15 mm deep there is exposed bone there is some granulation in the wound bed there is no surrounding epiboly no erythema warmth no cellulitis  Imaging: No results found. No images are attached to the encounter.  Labs: Lab Results  Component Value Date   HGBA1C 5.0 06/10/2017   HGBA1C 5.4 05/13/2013   ESRSEDRATE 2 05/13/2013   LABURIC 2.4 04/11/2017   REPTSTATUS 09/02/2022 FINAL 08/28/2022   GRAMSTAIN NO WBC SEEN MODERATE GRAM NEGATIVE RODS  08/28/2022   CULT  08/28/2022    MODERATE  PSEUDOMONAS AERUGINOSA FEW PROTEUS PENNERI NO ANAEROBES ISOLATED Performed at [facility], [address] [place], [place] [address]    LABORGA PSEUDOMONAS AERUGINOSA 08/28/2022   LABORGA PROTEUS PENNERI 08/28/2022     Lab Results  Component Value Date   ALBUMIN 2.0 (L) 02/21/2022   ALBUMIN 3.2 (L) 08/12/2020   ALBUMIN 3.3 (L) 07/25/2020    Lab Results  Component Value Date   MG 1.9 02/24/2022   MG 1.5 (L) 02/23/2022   MG 1.6 (L) 02/22/2022   Lab Results  Component Value Date   VD25OH 49.2 03/06/2017    No results found for: "PREALBUMIN"    Latest Ref Rng & Units 08/01/2022    1:21 AM 07/31/2022    8:39 AM 04/11/2022   12:56 PM  CBC EXTENDED  WBC 4.0 - 10.5 K/uL 7.0  9.3  10.8   RBC 3.87 - 5.11 MIL/uL 3.65  4.08  3.93   Hemoglobin 12.0 - 15.0 g/dL 69.6  29.5  28.4   HCT 36.0 - 46.0 % 33.8  38.1  36.7   Platelets 150 - 400 K/uL 157  160  228   NEUT# 1.7 - 7.7 K/uL   5.7   Lymph# 0.7 - 4.0 K/uL   3.6      There is no height or weight on file to calculate BMI.  Orders:  No  orders of the defined types were placed in this encounter.  No orders of the defined types were placed in this encounter.    Procedures: No procedures performed  Clinical Data: No additional findings.  ROS:  All other systems negative, except as noted in the HPI. Review of Systems  Objective: Vital Signs: There were no vitals taken for this visit.  Specialty Comments:  No specialty comments available.  PMFS History: Patient Active Problem List   Diagnosis Date Noted   Acute osteomyelitis of right foot (HCC) 08/28/2022   Osteomyelitis of foot, right, acute (HCC) 08/28/2022   Cutaneous abscess of right foot 07/31/2022   Abscess of right foot 07/31/2022   Acute heart failure with preserved ejection fraction (HFpEF) (HCC) 02/20/2022   Acute respiratory failure with hypoxia (HCC) 02/20/2022   AKI (acute kidney injury) (HCC) 02/15/2022   Aspiration pneumonia (HCC)  02/15/2022   Leukocytosis 02/15/2022   Falls frequently 02/15/2022   SIRS (systemic inflammatory response syndrome) (HCC) 08/12/2020   Mild protein malnutrition (HCC) 08/12/2020   Sepsis (HCC) 07/25/2020   Sepsis with acute hypoxic respiratory failure without septic shock (HCC)    Dementia without behavioral disturbance (HCC)    Palliative care by specialist    Goals of care, counseling/discussion    Pneumonia of right lower lobe due to infectious organism 11/06/2019   Intracerebral hematoma (HCC) 07/04/2019   Closed head injury 07/03/2019   Thrombocytopenia (HCC) 07/03/2019   Thyrotoxicosis, unspecified with thyrotoxic crisis or storm 01/15/2019   History of syndrome of inappropriate antidiuretic hormone (SIADH) 01/15/2019   UTI (urinary tract infection) 01/04/2018   Neuromuscular disorder (HCC)    Dysuria 10/30/2017   Fever 08/15/2017   Hypokalemia 08/15/2017   Altered mental state 08/15/2017   Acute metabolic encephalopathy 08/15/2017   Valproic acid toxicity 08/15/2017   Rectal cancer (HCC) 07/10/2017   Chronic deep vein thrombosis (DVT) of distal vein of lower extremity (HCC) 07/10/2017   Altered mental status    Ischemic stroke (HCC)    GERD (gastroesophageal reflux disease) 05/12/2017   GI bleed    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   Dysplastic rectal polyp    Protein-calorie malnutrition, severe 04/07/2017   Pressure injury of skin 04/04/2017   Anticoagulated 04/03/2017   Stool bloody 04/03/2017   Current every day smoker 04/03/2017   Rectal bleeding    Chronic diarrhea    Hyperthyroidism    S/P ORIF (open reduction internal fixation) fracture right hip IM nail 03/07/17 03/24/2017   Hypomagnesemia 03/07/2017   Fall    Closed intertrochanteric fracture of hip, right, initial encounter (HCC) 03/06/2017   Radial styloid fracture: right 03/06/2017   Normocytic anemia 03/06/2017   Orthostatic hypotension 06/07/2015   Lower urinary tract  infectious disease 06/07/2015   Rhabdomyolysis 06/07/2015   White matter abnormality on MRI of brain 05/14/2013   History of depression 05/14/2013   Hx of anxiety disorder 05/14/2013   Bipolar I disorder, most recent episode (or current) manic (HCC) 05/14/2013   Bipolar 1 disorder (HCC) 05/14/2013   Hyponatremia 05/12/2013   Lacunar infarct, acute (HCC) 05/12/2013   Past Medical History:  Diagnosis Date   Anxiety    Arthritis    Bipolar 1 disorder (HCC)    Chronic deep vein thrombosis (DVT) of distal vein of lower extremity (HCC) 07/10/2017   Chronic hip pain    Depression    GI bleed    Headache(784.0)    History of UTI/bladder spams.    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   Thyrotoxicosis, unspecified with thyrotoxic crisis or storm 01/15/2019    Family History  Problem Relation Age of Onset   Breast cancer Mother    Cancer - Other Mother    Alcoholism Father    Colon cancer Neg Hx    Colon polyps Neg Hx     Past Surgical History:  Procedure Laterality Date   COLONOSCOPY WITH PROPOFOL N/A 04/05/2017   Procedure: COLONOSCOPY WITH PROPOFOL;  Surgeon: Malissa Hippo, MD;  [facility];  Service: Endoscopy;  Laterality: N/A;   I & D EXTREMITY Right 07/31/2022   Procedure: RIGHT FOOT DEBRIDEMENT AND TISSUE GRAFT;  Surgeon: [name], MD;  Location: [facility] OR;  Service: Orthopedics;  Laterality: Right;   I & D EXTREMITY Right 08/03/2022   Procedure: RIGHT FOOT DEBRIDEMENT;  Surgeon: [name], MD;  Location: [facility] OR;  Service: Orthopedics;  Laterality: Right;   I & D EXTREMITY Right 08/28/2022   Procedure:  RIGHT FOOT DEBRIDEMENT;  Surgeon: [name], MD;  Location: [facility] OR;  Service: Orthopedics;  Laterality: Right;   I & D EXTREMITY Right 08/30/2022   Procedure: RIGHT FOOT DEBRIDEMENT;  Surgeon: [name], MD;  Location: [facility] OR;  Service: Orthopedics;  Laterality: Right;   INTRAMEDULLARY (IM) NAIL INTERTROCHANTERIC Right 03/07/2017   Procedure: OPEN TREATMENT INTERNAL FIXATION RIGHT HIP WITH GAMA INTRAMEDULARY NAIL;  Surgeon: Vickki Hearing, MD;  Location: [facility];  Service: Orthopedics;  Laterality: Right;   MULTIPLE EXTRACTIONS WITH ALVEOLOPLASTY N/A 10/30/2012   Procedure: MULTIPLE EXTRACION 5, 6, 8, 9, 10 ,18, 19, 31 WITH MAXILLARY RIGHT AND LEFT  ALVEOLOPLASTY REDUCE MAXILLARY LEFT TUBEROSITY;  Surgeon: Georgia Lopes, DDS;  Location: [facility] OR;  Service: Oral Surgery;  Laterality: N/A;   POLYPECTOMY  04/05/2017   Procedure: POLYPECTOMY;  Surgeon: Malissa Hippo, MD;  [facility];  Service: Endoscopy;;  recto-sigmoid, rectum   Social History   Occupational History   Not on file  Tobacco Use   Smoking status: Former    Current packs/day: 0.00    Average packs/day: 0.5 packs/day for 20.0 years (10.0 ttl pk-yrs)    Types: Cigarettes    Start date: 05/05/1997    Quit date: 05/05/2017    Years since quitting: 5.4   Smokeless tobacco: Never   Tobacco comments:    patient states she quit one week ago  Vaping Use   Vaping status: Never Used  Substance and Sexual Activity   Alcohol use: No   Drug use: No   Sexual activity: Not on file

## 2022-10-10 ENCOUNTER — Telehealth: Payer: Self-pay | Admitting: Family

## 2022-10-10 NOTE — Telephone Encounter (Signed)
[place] informed of VO approval.

## 2022-10-10 NOTE — Telephone Encounter (Signed)
[place] from [facility] would like verbal orders for skilled nursing 1 week 1, 2 week 5. Also 2 PRN visits and okay for dressing orders nurse would like to Clean area with Saline daily and apply gauze to area and wrap with Ace Bandage CB # [phone] Secure line okay to leave VM

## 2022-10-25 ENCOUNTER — Ambulatory Visit: Payer: Medicare Other | Admitting: Family

## 2022-10-25 ENCOUNTER — Encounter: Payer: Self-pay | Admitting: Family

## 2022-10-25 DIAGNOSIS — L89896 Pressure-induced deep tissue damage of other site: Secondary | ICD-10-CM

## 2022-10-25 NOTE — Progress Notes (Signed)
Office Visit Note   Patient: [name]           Date of Birth: January 29, 1955           MRN: [id number] Visit Date: 10/25/2022              Requested by: Toma Deiters, MD [address] [place],  [place] [address] PCP: Toma Deiters, MD  Chief Complaint  Patient presents with   Right Foot - Routine Post Op    08/30/2022 right foot debridement       HPI: The patient is a 67 year old woman who is seen in postoperative follow-up status post repeat debridement of the dorsum of the right foot   She has been doing Silvadene dressing changes daily with assistance of family her care is complicated by dementia   Family feels the wound is improving.  No systemic symptoms are worsening.  Assessment & Plan: Visit Diagnoses: No diagnosis found.  Plan: Continued bone exposure today in the dorsal ulcer wound.  She will continue on doxycycline.  They will continue with her at their current wound care conservative measures.  Have discussed possibility of amputation, transtibial amputation on the right definitive treatment for her the osteomyelitis.    They will discuss with Dr. Lajoyce Corners at next appointment  Follow-Up Instructions: Return in about 13 days (around 11/07/2022).   Ortho Exam  Patient is alert, oriented, no adenopathy, well-dressed, normal affect, normal respiratory effort. On examination right foot there is a dorsal ulcer present with surrounding edema the ulcer is 2 cm in diameter 15 mm deep there is exposed bone there is some granulation in the wound bed there is no surrounding epiboly no erythema warmth no cellulitis  Imaging: No results found. No images are attached to the encounter.  Labs: Lab Results  Component Value Date   HGBA1C 5.0 06/10/2017   HGBA1C 5.4 05/13/2013   ESRSEDRATE 2 05/13/2013   LABURIC 2.4 04/11/2017   REPTSTATUS 09/02/2022 FINAL 08/28/2022   GRAMSTAIN NO WBC SEEN MODERATE GRAM NEGATIVE RODS  08/28/2022   CULT  08/28/2022    MODERATE  PSEUDOMONAS AERUGINOSA FEW PROTEUS PENNERI NO ANAEROBES ISOLATED Performed at [facility], [address], [place], [place] [address]    LABORGA PSEUDOMONAS AERUGINOSA 08/28/2022   LABORGA PROTEUS PENNERI 08/28/2022     Lab Results  Component Value Date   ALBUMIN 2.0 (L) 02/21/2022   ALBUMIN 3.2 (L) 08/12/2020   ALBUMIN 3.3 (L) 07/25/2020    Lab Results  Component Value Date   MG 1.9 02/24/2022   MG 1.5 (L) 02/23/2022   MG 1.6 (L) 02/22/2022   Lab Results  Component Value Date   VD25OH 49.2 03/06/2017    No results found for: "PREALBUMIN"    Latest Ref Rng & Units 08/01/2022    1:21 AM 07/31/2022    8:39 AM 04/11/2022   12:56 PM  CBC EXTENDED  WBC 4.0 - 10.5 K/uL 7.0  9.3  10.8   RBC 3.87 - 5.11 MIL/uL 3.65  4.08  3.93   Hemoglobin 12.0 - 15.0 g/dL 82.9  56.2  13.0   HCT 36.0 - 46.0 % 33.8  38.1  36.7   Platelets 150 - 400 K/uL 157  160  228   NEUT# 1.7 - 7.7 K/uL   5.7   Lymph# 0.7 - 4.0 K/uL   3.6      There is no height or weight on file to calculate BMI.  Orders:  No orders of the defined types were placed in this encounter.  No orders of the defined types were placed in this encounter.    Procedures: No procedures performed  Clinical Data: No additional findings.  ROS:  All other systems negative, except as noted in the HPI. Review of Systems  Objective: Vital Signs: There were no vitals taken for this visit.  Specialty Comments:  No specialty comments available.  PMFS History: Patient Active Problem List   Diagnosis Date Noted   Acute osteomyelitis of right foot (HCC) 08/28/2022   Osteomyelitis of foot, right, acute (HCC) 08/28/2022   Cutaneous abscess of right foot 07/31/2022   Abscess of right foot 07/31/2022   Acute heart failure with preserved ejection fraction (HFpEF) (HCC) 02/20/2022   Acute respiratory failure with hypoxia (HCC) 02/20/2022   AKI (acute kidney injury) (HCC) 02/15/2022   Aspiration pneumonia (HCC)  02/15/2022   Leukocytosis 02/15/2022   Falls frequently 02/15/2022   SIRS (systemic inflammatory response syndrome) (HCC) 08/12/2020   Mild protein malnutrition (HCC) 08/12/2020   Sepsis (HCC) 07/25/2020   Sepsis with acute hypoxic respiratory failure without septic shock (HCC)    Dementia without behavioral disturbance (HCC)    Palliative care by specialist    Goals of care, counseling/discussion    Pneumonia of right lower lobe due to infectious organism 11/06/2019   Intracerebral hematoma (HCC) 07/04/2019   Closed head injury 07/03/2019   Thrombocytopenia (HCC) 07/03/2019   Thyrotoxicosis, unspecified with thyrotoxic crisis or storm 01/15/2019   History of syndrome of inappropriate antidiuretic hormone (SIADH) 01/15/2019   UTI (urinary tract infection) 01/04/2018   Neuromuscular disorder (HCC)    Dysuria 10/30/2017   Fever 08/15/2017   Hypokalemia 08/15/2017   Altered mental state 08/15/2017   Acute metabolic encephalopathy 08/15/2017   Valproic acid toxicity 08/15/2017   Rectal cancer (HCC) 07/10/2017   Chronic deep vein thrombosis (DVT) of distal vein of lower extremity (HCC) 07/10/2017   Altered mental status    Ischemic stroke (HCC)    GERD (gastroesophageal reflux disease) 05/12/2017   GI bleed    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   Dysplastic rectal polyp    Protein-calorie malnutrition, severe 04/07/2017   Pressure injury of skin 04/04/2017   Anticoagulated 04/03/2017   Stool bloody 04/03/2017   Current every day smoker 04/03/2017   Rectal bleeding    Chronic diarrhea    Hyperthyroidism    S/P ORIF (open reduction internal fixation) fracture right hip IM nail 03/07/17 03/24/2017   Hypomagnesemia 03/07/2017   Fall    Closed intertrochanteric fracture of hip, right, initial encounter (HCC) 03/06/2017   Radial styloid fracture: right 03/06/2017   Normocytic anemia 03/06/2017   Orthostatic hypotension 06/07/2015   Lower urinary tract  infectious disease 06/07/2015   Rhabdomyolysis 06/07/2015   White matter abnormality on MRI of brain 05/14/2013   History of depression 05/14/2013   Hx of anxiety disorder 05/14/2013   Bipolar I disorder, most recent episode (or current) manic (HCC) 05/14/2013   Bipolar 1 disorder (HCC) 05/14/2013   Hyponatremia 05/12/2013   Lacunar infarct, acute (HCC) 05/12/2013   Past Medical History:  Diagnosis Date   Anxiety    Arthritis    Bipolar 1 disorder (HCC)    Chronic deep vein thrombosis (DVT) of distal vein of lower extremity (HCC) 07/10/2017   Chronic hip pain    Depression    GI bleed    Headache(784.0)    History of UTI/bladder spams.  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   Thyrotoxicosis, unspecified with thyrotoxic crisis or storm 01/15/2019    Family History  Problem Relation Age of Onset   Breast cancer Mother    Cancer - Other Mother    Alcoholism Father    Colon cancer Neg Hx    Colon polyps Neg Hx     Past Surgical History:  Procedure Laterality Date   COLONOSCOPY WITH PROPOFOL N/A 04/05/2017   Procedure: COLONOSCOPY WITH PROPOFOL;  Surgeon: Malissa Hippo, MD;  [facility];  Service: Endoscopy;  Laterality: N/A;   I & D EXTREMITY Right 07/31/2022   Procedure: RIGHT FOOT DEBRIDEMENT AND TISSUE GRAFT;  Surgeon: [name], MD;  Location: [facility] OR;  Service: Orthopedics;  Laterality: Right;   I & D EXTREMITY Right 08/03/2022   Procedure: RIGHT FOOT DEBRIDEMENT;  Surgeon: [name], MD;  Location: [facility] OR;  Service: Orthopedics;  Laterality: Right;   I & D EXTREMITY Right 08/28/2022   Procedure:  RIGHT FOOT DEBRIDEMENT;  Surgeon: [name], MD;  Location: [facility] OR;  Service: Orthopedics;  Laterality: Right;   I & D EXTREMITY Right 08/30/2022   Procedure: RIGHT FOOT DEBRIDEMENT;  Surgeon: [name], MD;  Location: [facility] OR;  Service: Orthopedics;  Laterality: Right;   INTRAMEDULLARY (IM) NAIL INTERTROCHANTERIC Right 03/07/2017   Procedure: OPEN TREATMENT INTERNAL FIXATION RIGHT HIP WITH GAMA INTRAMEDULARY NAIL;  Surgeon: Vickki Hearing, MD;  Location: [facility];  Service: Orthopedics;  Laterality: Right;   MULTIPLE EXTRACTIONS WITH ALVEOLOPLASTY N/A 10/30/2012   Procedure: MULTIPLE EXTRACION 5, 6, 8, 9, 10 ,18, 19, 31 WITH MAXILLARY RIGHT AND LEFT  ALVEOLOPLASTY REDUCE MAXILLARY LEFT TUBEROSITY;  Surgeon: Georgia Lopes, DDS;  Location: [facility] OR;  Service: Oral Surgery;  Laterality: N/A;   POLYPECTOMY  04/05/2017   Procedure: POLYPECTOMY;  Surgeon: Malissa Hippo, MD;  Location: [facility];  Service: Endoscopy;;  recto-sigmoid, rectum   Social History   Occupational History   Not on file  Tobacco Use   Smoking status: Former    Current packs/day: 0.00    Average packs/day: 0.5 packs/day for 20.0 years (10.0 ttl pk-yrs)    Types: Cigarettes    Start date: 05/05/1997    Quit date: 05/05/2017    Years since quitting: 5.4   Smokeless tobacco: Never   Tobacco comments:    patient states she quit one week ago  Vaping Use   Vaping status: Never Used  Substance and Sexual Activity   Alcohol use: No   Drug use: No   Sexual activity: Not on file

## 2022-11-04 ENCOUNTER — Encounter: Payer: Self-pay | Admitting: *Deleted

## 2022-11-04 ENCOUNTER — Telehealth: Payer: Self-pay | Admitting: Orthopedic Surgery

## 2022-11-04 ENCOUNTER — Ambulatory Visit: Payer: Self-pay | Admitting: *Deleted

## 2022-11-04 NOTE — Telephone Encounter (Addendum)
[facility] would like to know pts weight baring status please advise it is not in notes would like to know for transfers and PT Please advise call Annette at [phone]

## 2022-11-05 NOTE — Telephone Encounter (Signed)
Full wtb as tolerated

## 2022-11-05 NOTE — Telephone Encounter (Signed)
Annette informed 

## 2022-11-08 ENCOUNTER — Encounter: Payer: Self-pay | Admitting: Family

## 2022-11-08 ENCOUNTER — Ambulatory Visit (INDEPENDENT_AMBULATORY_CARE_PROVIDER_SITE_OTHER): Payer: Medicare Other | Admitting: Family

## 2022-11-08 DIAGNOSIS — L89896 Pressure-induced deep tissue damage of other site: Secondary | ICD-10-CM

## 2022-11-08 NOTE — Progress Notes (Signed)
Office Visit Note   Patient: Cassidy Smith           Date of Birth: 24-Feb-1955           MRN: [id number] Visit Date: 11/08/2022              Requested by: Toma Deiters, MD [address] [place],  [place] [address] PCP: Toma Deiters, MD  Chief Complaint  Patient presents with   Right Foot - Routine Post Op    08/30/2022 right foot debridement      HPI: The patient is a 67 year old woman seen in follow-up for ulcer with bone exposure to the dorsum of the right foot.  She is status post repeat debridements of this wound in the past.  They have been doing wound cleansing with saline packing open with Silvadene  They continue to be encouraged by the wound.  No fever no chills  Assessment & Plan: Visit Diagnoses: No diagnosis found.  Plan: Continue daily wound cleansing packing open wound.  Follow-up in 3 weeks.  They are aware of return precautions.  Follow-Up Instructions: No follow-ups on file.   Ortho Exam  Patient is alert, oriented, no adenopathy, well-dressed, normal affect, normal respiratory effort. On examination of the right foot the dorsal ulcer has decreased in size there is granulation in 90% of the wound bed.  There is small area of exposed bone no drainage no purulence no erythema no cellulitis  Imaging: No results found.   Labs: Lab Results  Component Value Date   HGBA1C 5.0 06/10/2017   HGBA1C 5.4 05/13/2013   ESRSEDRATE 2 05/13/2013   LABURIC 2.4 04/11/2017   REPTSTATUS 09/02/2022 FINAL 08/28/2022   GRAMSTAIN NO WBC SEEN MODERATE GRAM NEGATIVE RODS  08/28/2022   CULT  08/28/2022    MODERATE PSEUDOMONAS AERUGINOSA FEW PROTEUS PENNERI NO ANAEROBES ISOLATED Performed at [facility], [address] [place], [place] [address]    LABORGA PSEUDOMONAS AERUGINOSA 08/28/2022   LABORGA PROTEUS PENNERI 08/28/2022     Lab Results  Component Value Date   ALBUMIN 2.0 (L) 02/21/2022   ALBUMIN 3.2 (L) 08/12/2020   ALBUMIN 3.3 (L)  07/25/2020    Lab Results  Component Value Date   MG 1.9 02/24/2022   MG 1.5 (L) 02/23/2022   MG 1.6 (L) 02/22/2022   Lab Results  Component Value Date   VD25OH 49.2 03/06/2017    No results found for: "PREALBUMIN"    Latest Ref Rng & Units 08/01/2022    1:21 AM 07/31/2022    8:39 AM 04/11/2022   12:56 PM  CBC EXTENDED  WBC 4.0 - 10.5 K/uL 7.0  9.3  10.8   RBC 3.87 - 5.11 MIL/uL 3.65  4.08  3.93   Hemoglobin 12.0 - 15.0 g/dL 32.4  40.1  02.7   HCT 36.0 - 46.0 % 33.8  38.1  36.7   Platelets 150 - 400 K/uL 157  160  228   NEUT# 1.7 - 7.7 K/uL   5.7   Lymph# 0.7 - 4.0 K/uL   3.6      There is no height or weight on file to calculate BMI.  Orders:  No orders of the defined types were placed in this encounter.  No orders of the defined types were placed in this encounter.    Procedures: No procedures performed  Clinical Data: No additional findings.  ROS:  All other systems negative, except as noted in the HPI. Review of  Systems  Objective: Vital Signs: There were no vitals taken for this visit.  Specialty Comments:  No specialty comments available.  PMFS History: Patient Active Problem List   Diagnosis Date Noted   Acute osteomyelitis of right foot (HCC) 08/28/2022   Osteomyelitis of foot, right, acute (HCC) 08/28/2022   Cutaneous abscess of right foot 07/31/2022   Abscess of right foot 07/31/2022   Acute heart failure with preserved ejection fraction (HFpEF) (HCC) 02/20/2022   Acute respiratory failure with hypoxia (HCC) 02/20/2022   AKI (acute kidney injury) (HCC) 02/15/2022   Aspiration pneumonia (HCC) 02/15/2022   Leukocytosis 02/15/2022   Falls frequently 02/15/2022   SIRS (systemic inflammatory response syndrome) (HCC) 08/12/2020   Mild protein malnutrition (HCC) 08/12/2020   Sepsis (HCC) 07/25/2020   Sepsis with acute hypoxic respiratory failure without septic shock (HCC)    Dementia without behavioral disturbance (HCC)    Palliative care by  specialist    Goals of care, counseling/discussion    Pneumonia of right lower lobe due to infectious organism 11/06/2019   Intracerebral hematoma (HCC) 07/04/2019   Closed head injury 07/03/2019   Thrombocytopenia (HCC) 07/03/2019   Thyrotoxicosis, unspecified with thyrotoxic crisis or storm 01/15/2019   History of syndrome of inappropriate antidiuretic hormone (SIADH) 01/15/2019   UTI (urinary tract infection) 01/04/2018   Neuromuscular disorder (HCC)    Dysuria 10/30/2017   Fever 08/15/2017   Hypokalemia 08/15/2017   Altered mental state 08/15/2017   Acute metabolic encephalopathy 08/15/2017   Valproic acid toxicity 08/15/2017   Rectal cancer (HCC) 07/10/2017   Chronic deep vein thrombosis (DVT) of distal vein of lower extremity (HCC) 07/10/2017   Altered mental status    Ischemic stroke (HCC)    GERD (gastroesophageal reflux disease) 05/12/2017   GI bleed    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   Dysplastic rectal polyp    Protein-calorie malnutrition, severe 04/07/2017   Pressure injury of skin 04/04/2017   Anticoagulated 04/03/2017   Stool bloody 04/03/2017   Current every day smoker 04/03/2017   Rectal bleeding    Chronic diarrhea    Hyperthyroidism    S/P ORIF (open reduction internal fixation) fracture right hip IM nail 03/07/17 03/24/2017   Hypomagnesemia 03/07/2017   Fall    Closed intertrochanteric fracture of hip, right, initial encounter (HCC) 03/06/2017   Radial styloid fracture: right 03/06/2017   Normocytic anemia 03/06/2017   Orthostatic hypotension 06/07/2015   Lower urinary tract infectious disease 06/07/2015   Rhabdomyolysis 06/07/2015   White matter abnormality on MRI of brain 05/14/2013   History of depression 05/14/2013   Hx of anxiety disorder 05/14/2013   Bipolar I disorder, most recent episode (or current) manic (HCC) 05/14/2013   Bipolar 1 disorder (HCC) 05/14/2013   Hyponatremia 05/12/2013   Lacunar infarct, acute  (HCC) 05/12/2013   Past Medical History:  Diagnosis Date   Anxiety    Arthritis    Bipolar 1 disorder (HCC)    Chronic deep vein thrombosis (DVT) of distal vein of lower extremity (HCC) 07/10/2017   Chronic hip pain    Depression    GI bleed    Headache(784.0)    History of UTI/bladder spams.    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   Thyrotoxicosis, unspecified with thyrotoxic crisis or storm 01/15/2019    Family History  Problem Relation Age of Onset   Breast cancer Mother    Cancer - Other Mother    Alcoholism Father    Colon cancer Neg Hx    Colon polyps Neg Hx     Past Surgical History:  Procedure Laterality Date   COLONOSCOPY WITH PROPOFOL N/A 04/05/2017   Procedure: COLONOSCOPY WITH PROPOFOL;  Surgeon: Malissa Hippo, MD;  [facility];  Service: Endoscopy;  Laterality: N/A;   I & D EXTREMITY Right 07/31/2022   Procedure: RIGHT FOOT DEBRIDEMENT AND TISSUE GRAFT;  Surgeon: [name], MD;  Location: [facility] OR;  Service: Orthopedics;  Laterality: Right;   I & D EXTREMITY Right 08/03/2022   Procedure: RIGHT FOOT DEBRIDEMENT;  Surgeon: [name], MD;  Location: [facility] OR;  Service: Orthopedics;  Laterality: Right;   I & D EXTREMITY Right 08/28/2022   Procedure: RIGHT FOOT DEBRIDEMENT;  Surgeon: [name], MD;  Location: [facility] OR;  Service: Orthopedics;  Laterality: Right;   I & D EXTREMITY Right 08/30/2022   Procedure: RIGHT FOOT DEBRIDEMENT;  Surgeon: [name], MD;  Location: [facility] OR;  Service: Orthopedics;  Laterality: Right;   INTRAMEDULLARY (IM) NAIL INTERTROCHANTERIC Right 03/07/2017   Procedure: OPEN  TREATMENT INTERNAL FIXATION RIGHT HIP WITH GAMA INTRAMEDULARY NAIL;  Surgeon: Vickki Hearing, MD;  Location: [facility];  Service: Orthopedics;  Laterality: Right;   MULTIPLE EXTRACTIONS WITH ALVEOLOPLASTY N/A 10/30/2012   Procedure: MULTIPLE EXTRACION 5, 6, 8, 9, 10 ,18, 19, 31 WITH MAXILLARY RIGHT AND LEFT  ALVEOLOPLASTY REDUCE MAXILLARY LEFT TUBEROSITY;  Surgeon: Georgia Lopes, DDS;  Location: [facility] OR;  Service: Oral Surgery;  Laterality: N/A;   POLYPECTOMY  04/05/2017   Procedure: POLYPECTOMY;  Surgeon: Malissa Hippo, MD;  [facility]: [facility];  Service: Endoscopy;;  recto-sigmoid, rectum   Social History   Occupational History   Not on file  Tobacco Use   Smoking status: Former    Current packs/day: 0.00    Average packs/day: 0.5 packs/day for 20.0 years (10.0 ttl pk-yrs)    Types: Cigarettes    Start date: 05/05/1997    Quit date: 05/05/2017    Years since quitting: 5.5   Smokeless tobacco: Never   Tobacco comments:    patient states she quit one week ago  Vaping Use   Vaping status: Never Used  Substance and Sexual Activity   Alcohol use: No   Drug use: No   Sexual activity: Not on file

## 2022-11-12 NOTE — Patient Outreach (Signed)
Care Coordination   Follow Up Visit Note   11/04/2022 Name: NAJIA JOHANSEN MRN: [id number] DOB: July 03, 1955  [name] is a 67 y.o. year old female who sees Hasanaj, Myra Gianotti, MD for primary care. I  spoke with brother and caregiver, [name], by telephone.  What matters to the patients health and wellness today?  Managing foot wound    Goals Addressed             This Visit's Progress    Right Ankle Fracture and Wound Healing   On track    Care Coordination Goals: Patient/brother will monitor wound for any signs/symptoms of infection and will notify orthopedic provider if any are noted Patient will continue working with HHPT for strength and gait training Brother will continue to clean wound and change dressing daily as instructed by orthopedic provider wound cleansing with saline packing open with Silvadene  Patient will keep follow-up appointment with Barnie Del, NP [facility] on 11/08/22 Patient/family will reach out to RN Care Coordinator at [phone] with any resource or care coordination needs        SDOH assessments and interventions completed:  Yes  SDOH Interventions Today    Flowsheet Row Most Recent Value  SDOH Interventions   Food Insecurity Interventions Intervention Not Indicated  Housing Interventions Intervention Not Indicated  Transportation Interventions Patient Resources (Friends/Family)  Health Literacy Interventions Intervention Not Indicated, Other (Comment)  [due to dementia. Brother assists.]        Care Coordination Interventions:  Yes, provided  Interventions Today    Flowsheet Row Most Recent Value  Chronic Disease   Chronic disease during today's visit Other  [dementia, wound on foot]  General Interventions   General Interventions Discussed/Reviewed General Interventions Discussed, General Interventions Reviewed, Doctor Visits  Doctor Visits Discussed/Reviewed Doctor Visits Discussed, Doctor Visits Reviewed,  Specialist  Durable Medical Equipment (DME) Wheelchair  Wheelchair Standard  PCP/Specialist Visits Compliance with follow-up visit  Barnie Del, NP (orthopedist) on 11/08/22]  Exercise Interventions   Exercise Discussed/Reviewed Exercise Discussed, Exercise Reviewed, Physical Activity  Physical Activity Discussed/Reviewed Physical Activity Discussed, Physical Activity Reviewed  Education Interventions   Education Provided Provided Education  Provided Verbal Education On Foot Care, When to see the doctor  Nutrition Interventions   Nutrition Discussed/Reviewed Nutrition Discussed, Nutrition Reviewed, Increasing proteins, Adding fruits and vegetables  Pharmacy Interventions   Pharmacy Dicussed/Reviewed Pharmacy Topics Discussed, Pharmacy Topics Reviewed, Medications and their functions  Safety Interventions   Safety Discussed/Reviewed Safety Discussed, Safety Reviewed, Fall Risk, Home Safety  Home Safety Assistive Devices  Advanced Directive Interventions   Advanced Directives Discussed/Reviewed Advanced Directives Discussed, Advanced Directives Reviewed       Follow up plan: Follow up call scheduled for 12/06/22    Encounter Outcome:  Patient Visit Completed   Demetrios Loll, RN, BSN Care Management Coordinator [facility]  [facility] Direct Dial: [phone] Main #: [phone]

## 2022-11-13 ENCOUNTER — Telehealth: Payer: Self-pay | Admitting: Family

## 2022-11-13 MED ORDER — DOXYCYCLINE HYCLATE 100 MG PO TABS: 100.0000 mg | ORAL_TABLET | Freq: Two times a day (BID) | ORAL | 0 refills | Status: DC

## 2022-11-13 MED ORDER — SILVER SULFADIAZINE 1 % EX CREA: 1.0000 | TOPICAL_CREAM | Freq: Every day | CUTANEOUS | 0 refills | Status: AC

## 2022-11-13 NOTE — Addendum Note (Signed)
Addended by: Barnie Del R on: 11/13/2022 03:24 PM   Modules accepted: Orders

## 2022-11-13 NOTE — Telephone Encounter (Signed)
Pt brother called in stating pt needs the antibiotic for pt foot he is going to [place] today and would like to pick it up for her while he is in town

## 2022-11-29 ENCOUNTER — Encounter: Payer: Self-pay | Admitting: Family

## 2022-11-29 ENCOUNTER — Ambulatory Visit (INDEPENDENT_AMBULATORY_CARE_PROVIDER_SITE_OTHER): Payer: Medicare Other | Admitting: Family

## 2022-11-29 DIAGNOSIS — L89896 Pressure-induced deep tissue damage of other site: Secondary | ICD-10-CM

## 2022-11-29 DIAGNOSIS — M86271 Subacute osteomyelitis, right ankle and foot: Secondary | ICD-10-CM | POA: Diagnosis not present

## 2022-11-29 NOTE — Progress Notes (Signed)
Office Visit Note   Patient: Cassidy Smith           Date of Birth: 07/29/1955           MRN: [id number] Visit Date: 11/29/2022              Requested by: Toma Deiters, MD [address] [place],  [place] [address] PCP: Toma Deiters, MD  Chief Complaint  Patient presents with   Right Foot - Routine Post Op    08/30/2022 right foot debridement      HPI: The patient is a 67 year old woman seen in follow-up for ulcer with bone exposure to the dorsum of the right foot.  She is status post repeat debridements of this wound in the past.  They have been doing wound cleansing with saline packing open with Silvadene  They continue to be encouraged by the wound.  Wound has gotten smaller in the interim. no fever no chills  Assessment & Plan: Visit Diagnoses: No diagnosis found.  Plan: Continue daily wound cleansing packing open wound.  Follow-up in 3 weeks.  They are aware of return precautions.  Follow-Up Instructions: Return in about 3 weeks (around 12/20/2022).   Ortho Exam  Patient is alert, oriented, no adenopathy, well-dressed, normal affect, normal respiratory effort. On examination of the right foot the dorsal ulcer has decreased in size there is granulation in 90% of the wound bed.  Is now measuring 1 cm in diameter 1 cm deep with full granulation.  There is small area of exposed bone no drainage no purulence no erythema no cellulitis  Imaging: No results found.   Labs: Lab Results  Component Value Date   HGBA1C 5.0 06/10/2017   HGBA1C 5.4 05/13/2013   ESRSEDRATE 2 05/13/2013   LABURIC 2.4 04/11/2017   REPTSTATUS 09/02/2022 FINAL 08/28/2022   GRAMSTAIN NO WBC SEEN MODERATE GRAM NEGATIVE RODS  08/28/2022   CULT  08/28/2022    MODERATE PSEUDOMONAS AERUGINOSA FEW PROTEUS PENNERI NO ANAEROBES ISOLATED Performed at [facility], [address] [place], [place] [address]    LABORGA PSEUDOMONAS AERUGINOSA 08/28/2022   LABORGA PROTEUS PENNERI 08/28/2022      Lab Results  Component Value Date   ALBUMIN 2.0 (L) 02/21/2022   ALBUMIN 3.2 (L) 08/12/2020   ALBUMIN 3.3 (L) 07/25/2020    Lab Results  Component Value Date   MG 1.9 02/24/2022   MG 1.5 (L) 02/23/2022   MG 1.6 (L) 02/22/2022   Lab Results  Component Value Date   VD25OH 49.2 03/06/2017    No results found for: "PREALBUMIN"    Latest Ref Rng & Units 08/01/2022    1:21 AM 07/31/2022    8:39 AM 04/11/2022   12:56 PM  CBC EXTENDED  WBC 4.0 - 10.5 K/uL 7.0  9.3  10.8   RBC 3.87 - 5.11 MIL/uL 3.65  4.08  3.93   Hemoglobin 12.0 - 15.0 g/dL 95.2  84.1  32.4   HCT 36.0 - 46.0 % 33.8  38.1  36.7   Platelets 150 - 400 K/uL 157  160  228   NEUT# 1.7 - 7.7 K/uL   5.7   Lymph# 0.7 - 4.0 K/uL   3.6      There is no height or weight on file to calculate BMI.  Orders:  No orders of the defined types were placed in this encounter.  No orders of the defined types were placed in this encounter.    Procedures:  No procedures performed  Clinical Data: No additional findings.  ROS:  All other systems negative, except as noted in the HPI. Review of Systems  Objective: Vital Signs: There were no vitals taken for this visit.  Specialty Comments:  No specialty comments available.  PMFS History: Patient Active Problem List   Diagnosis Date Noted   Acute osteomyelitis of right foot (HCC) 08/28/2022   Osteomyelitis of foot, right, acute (HCC) 08/28/2022   Cutaneous abscess of right foot 07/31/2022   Abscess of right foot 07/31/2022   Acute heart failure with preserved ejection fraction (HFpEF) (HCC) 02/20/2022   Acute respiratory failure with hypoxia (HCC) 02/20/2022   AKI (acute kidney injury) (HCC) 02/15/2022   Aspiration pneumonia (HCC) 02/15/2022   Leukocytosis 02/15/2022   Falls frequently 02/15/2022   SIRS (systemic inflammatory response syndrome) (HCC) 08/12/2020   Mild protein malnutrition (HCC) 08/12/2020   Sepsis (HCC) 07/25/2020   Sepsis with acute hypoxic  respiratory failure without septic shock (HCC)    Dementia without behavioral disturbance (HCC)    Palliative care by specialist    Goals of care, counseling/discussion    Pneumonia of right lower lobe due to infectious organism 11/06/2019   Intracerebral hematoma (HCC) 07/04/2019   Closed head injury 07/03/2019   Thrombocytopenia (HCC) 07/03/2019   Thyrotoxicosis, unspecified with thyrotoxic crisis or storm 01/15/2019   History of syndrome of inappropriate antidiuretic hormone (SIADH) 01/15/2019   UTI (urinary tract infection) 01/04/2018   Neuromuscular disorder (HCC)    Dysuria 10/30/2017   Fever 08/15/2017   Hypokalemia 08/15/2017   Altered mental state 08/15/2017   Acute metabolic encephalopathy 08/15/2017   Valproic acid toxicity 08/15/2017   Rectal cancer (HCC) 07/10/2017   Chronic deep vein thrombosis (DVT) of distal vein of lower extremity (HCC) 07/10/2017   Altered mental status    Ischemic stroke (HCC)    GERD (gastroesophageal reflux disease) 05/12/2017   GI bleed    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   Dysplastic rectal polyp    Protein-calorie malnutrition, severe 04/07/2017   Pressure injury of skin 04/04/2017   Anticoagulated 04/03/2017   Stool bloody 04/03/2017   Current every day smoker 04/03/2017   Rectal bleeding    Chronic diarrhea    Hyperthyroidism    S/P ORIF (open reduction internal fixation) fracture right hip IM nail 03/07/17 03/24/2017   Hypomagnesemia 03/07/2017   Fall    Closed intertrochanteric fracture of hip, right, initial encounter (HCC) 03/06/2017   Radial styloid fracture: right 03/06/2017   Normocytic anemia 03/06/2017   Orthostatic hypotension 06/07/2015   Lower urinary tract infectious disease 06/07/2015   Rhabdomyolysis 06/07/2015   White matter abnormality on MRI of brain 05/14/2013   History of depression 05/14/2013   Hx of anxiety disorder 05/14/2013   Bipolar I disorder, most recent episode (or  current) manic (HCC) 05/14/2013   Bipolar 1 disorder (HCC) 05/14/2013   Hyponatremia 05/12/2013   Lacunar infarct, acute (HCC) 05/12/2013   Past Medical History:  Diagnosis Date   Anxiety    Arthritis    Bipolar 1 disorder (HCC)    Chronic deep vein thrombosis (DVT) of distal vein of lower extremity (HCC) 07/10/2017   Chronic hip pain    Depression    GI bleed    Headache(784.0)    History of UTI/bladder spams.    Hyperthyroidism    Hypokalemia 08/15/2017   Intracerebral hematoma (HCC) 07/04/2019   Ischemic stroke (HCC)    Neuromuscular disorder (HCC)    shaking  of hands    Pneumonia of right lower lobe due to infectious organism 11/06/2019   Psychosis [facility])    Radial styloid fracture: right 03/06/2017   Rhabdomyolysis 06/07/2015   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   Thyrotoxicosis, unspecified with thyrotoxic crisis or storm 01/15/2019    Family History  Problem Relation Age of Onset   Breast cancer Mother    Cancer - Other Mother    Alcoholism Father    Colon cancer Neg Hx    Colon polyps Neg Hx     Past Surgical History:  Procedure Laterality Date   COLONOSCOPY WITH PROPOFOL N/A 04/05/2017   Procedure: COLONOSCOPY WITH PROPOFOL;  Surgeon: Malissa Hippo, MD;  [facility];  Service: Endoscopy;  Laterality: N/A;   I & D EXTREMITY Right 07/31/2022   Procedure: RIGHT FOOT DEBRIDEMENT AND TISSUE GRAFT;  Surgeon: [name], MD;  Location: [facility] OR;  Service: Orthopedics;  Laterality: Right;   I & D EXTREMITY Right 08/03/2022   Procedure: RIGHT FOOT DEBRIDEMENT;  Surgeon: [name], MD;  Location: [facility] OR;  Service: Orthopedics;  Laterality: Right;   I & D EXTREMITY Right 08/28/2022   Procedure: RIGHT FOOT DEBRIDEMENT;  Surgeon: [name], MD;  Location: [facility] OR;  Service: Orthopedics;  Laterality: Right;   I & D EXTREMITY Right 08/30/2022   Procedure: RIGHT FOOT DEBRIDEMENT;  Surgeon: [name], MD;  Location: [facility] OR;   Service: Orthopedics;  Laterality: Right;   INTRAMEDULLARY (IM) NAIL INTERTROCHANTERIC Right 03/07/2017   Procedure: OPEN TREATMENT INTERNAL FIXATION RIGHT HIP WITH GAMA INTRAMEDULARY NAIL;  Surgeon: Vickki Hearing, MD;  Location: [facility];  Service: Orthopedics;  Laterality: Right;   MULTIPLE EXTRACTIONS WITH ALVEOLOPLASTY N/A 10/30/2012   Procedure: MULTIPLE EXTRACION 5, 6, 8, 9, 10 ,18, 19, 31 WITH MAXILLARY RIGHT AND LEFT  ALVEOLOPLASTY REDUCE MAXILLARY LEFT TUBEROSITY;  Surgeon: Georgia Lopes, DDS;  Location: [facility] OR;  Service: Oral Surgery;  Laterality: N/A;   POLYPECTOMY  04/05/2017   Procedure: POLYPECTOMY;  Surgeon: Malissa Hippo, MD;  [facility];  Service: Endoscopy;;  recto-sigmoid, rectum   Social History   Occupational History   Not on file  Tobacco Use   Smoking status: Former    Current packs/day: 0.00    Average packs/day: 0.5 packs/day for 20.0 years (10.0 ttl pk-yrs)    Types: Cigarettes    Start date: 05/05/1997    Quit date: 05/05/2017    Years since quitting: 5.5   Smokeless tobacco: Never   Tobacco comments:    patient states she quit one week ago  Vaping Use   Vaping status: Never Used  Substance and Sexual Activity   Alcohol use: No   Drug use: No   Sexual activity: Not on file

## 2022-12-06 ENCOUNTER — Encounter: Payer: Self-pay | Admitting: *Deleted

## 2022-12-09 ENCOUNTER — Encounter: Payer: Self-pay | Admitting: *Deleted

## 2022-12-11 ENCOUNTER — Ambulatory Visit: Payer: Self-pay | Admitting: *Deleted

## 2022-12-11 ENCOUNTER — Encounter: Payer: Self-pay | Admitting: *Deleted

## 2022-12-16 NOTE — Patient Outreach (Signed)
  Care Coordination   Follow Up Visit Note   12/11/2022 Name: KORENA HEBRON MRN: [id number] DOB: July 20, 1955  [name] is a 67 y.o. year old female who sees Hasanaj, Myra Gianotti, MD for primary care. I  spoke with patient's brother and caregiver, [name], by telephone.  What matters to the patients health and wellness today?  Continued healing of foot wound. Wound is healing well and does not have any additional concerns.   Goals Addressed             This Visit's Progress    Right Ankle Fracture and Wound Healing   On track    Care Coordination Goals: Patient/brother will monitor wound for any signs/symptoms of infection and will notify orthopedic provider if any are noted Brother will continue to clean wound and change dressing daily as instructed by orthopedic provider wound cleansing with saline packing open with Silvadene  Wound is healing and has good granulation tissue per provider note and brother's observation Patient will keep follow-up appointment with Barnie Del, NP [facility] on 12/24/22 Patient will increase activity level as tolerated Patient will practice fall precautions Patient/family will reach out to RN Care Manager at [phone] with any resource or care coordination needs        SDOH assessments and interventions completed:  No   Care Coordination Interventions:  Yes, provided  Interventions Today    Flowsheet Row Most Recent Value  Chronic Disease   Chronic disease during today's visit Other  [Dementia, chronic wound of right foot]  General Interventions   General Interventions Discussed/Reviewed General Interventions Discussed, General Interventions Reviewed, Doctor Visits  Doctor Visits Discussed/Reviewed Doctor Visits Discussed, Doctor Visits Reviewed, PCP, Specialist  PCP/Specialist Visits Compliance with follow-up visit  [Orthopedics on 12/24/22]  Exercise Interventions   Exercise Discussed/Reviewed Physical Activity  Physical  Activity Discussed/Reviewed Physical Activity Discussed, Physical Activity Reviewed  [Ambulating more. Working with HHPT.]  Education Interventions   Education Provided Provided Education  Provided Engineer, petroleum On When to see the doctor, Other, Medication, Nutrition, Foot Care, Exercise  [wound care, S/S of infection]  Nutrition Interventions   Nutrition Discussed/Reviewed Nutrition Discussed, Nutrition Reviewed, Adding fruits and vegetables, Increasing proteins  Pharmacy Interventions   Pharmacy Dicussed/Reviewed Pharmacy Topics Discussed, Pharmacy Topics Reviewed, Medications and their functions  Safety Interventions   Safety Discussed/Reviewed Safety Discussed, Safety Reviewed, Fall Risk, Home Safety  Home Safety Assistive Devices       Follow up plan: Follow up call scheduled for 12/30/22.    Encounter Outcome:  Patient Visit Completed   Demetrios Loll, RN, BSN Care Management Coordinator [facility]  [facility] Direct Dial: [phone] Main #: [phone]

## 2022-12-24 ENCOUNTER — Ambulatory Visit (INDEPENDENT_AMBULATORY_CARE_PROVIDER_SITE_OTHER): Payer: Medicare Other | Admitting: Family

## 2022-12-24 ENCOUNTER — Encounter: Payer: Self-pay | Admitting: Family

## 2022-12-24 DIAGNOSIS — L89896 Pressure-induced deep tissue damage of other site: Secondary | ICD-10-CM

## 2022-12-24 NOTE — Progress Notes (Signed)
Office Visit Note   Patient: Cassidy Smith           Date of Birth: 02-05-1955           MRN: [id number] Visit Date: 12/24/2022              Requested by: Toma Deiters, MD [address] [place],  [place] [address] PCP: Toma Deiters, MD  Chief Complaint  Patient presents with   Right Foot - Follow-up    08/30/2022 right foot debridement      HPI: The patient is a 67 year old woman seen in follow-up for ulcer with bone exposure to the dorsum of the right foot.  She is status post repeat debridements of this wound in the past.  They have been doing wound cleansing with saline packing open with Silvadene  They continue to be encouraged by the wound.  Wound has gotten smaller in the interim. no fever no chills  Assessment & Plan: Visit Diagnoses: No diagnosis found.  Plan: Continue daily wound cleansing packing open wound.  Follow-up in 4 weeks.  They are aware of return precautions.  Follow-Up Instructions: Return in about 4 weeks (around 01/21/2023).   Ortho Exam  Patient is alert, oriented, no adenopathy, well-dressed, normal affect, normal respiratory effort. On examination of the right foot the dorsal ulcer has decreased in size there is granulation in 90% of the wound bed.  Is now measuring 0.3 cm in diameter 0.2 cm deep with full granulation.  There is small area of exposed bone no drainage no purulence no erythema no cellulitis  Imaging: No results found.   Labs: Lab Results  Component Value Date   HGBA1C 5.0 06/10/2017   HGBA1C 5.4 05/13/2013   ESRSEDRATE 2 05/13/2013   LABURIC 2.4 04/11/2017   REPTSTATUS 09/02/2022 FINAL 08/28/2022   GRAMSTAIN NO WBC SEEN MODERATE GRAM NEGATIVE RODS  08/28/2022   CULT  08/28/2022    MODERATE PSEUDOMONAS AERUGINOSA FEW PROTEUS PENNERI NO ANAEROBES ISOLATED Performed at [facility], [address], [place], [place] [address]    LABORGA PSEUDOMONAS AERUGINOSA 08/28/2022   LABORGA PROTEUS PENNERI 08/28/2022      Lab Results  Component Value Date   ALBUMIN 2.0 (L) 02/21/2022   ALBUMIN 3.2 (L) 08/12/2020   ALBUMIN 3.3 (L) 07/25/2020    Lab Results  Component Value Date   MG 1.9 02/24/2022   MG 1.5 (L) 02/23/2022   MG 1.6 (L) 02/22/2022   Lab Results  Component Value Date   VD25OH 49.2 03/06/2017    No results found for: "PREALBUMIN"    Latest Ref Rng & Units 08/01/2022    1:21 AM 07/31/2022    8:39 AM 04/11/2022   12:56 PM  CBC EXTENDED  WBC 4.0 - 10.5 K/uL 7.0  9.3  10.8   RBC 3.87 - 5.11 MIL/uL 3.65  4.08  3.93   Hemoglobin 12.0 - 15.0 g/dL 25.3  66.4  40.3   HCT 36.0 - 46.0 % 33.8  38.1  36.7   Platelets 150 - 400 K/uL 157  160  228   NEUT# 1.7 - 7.7 K/uL   5.7   Lymph# 0.7 - 4.0 K/uL   3.6      There is no height or weight on file to calculate BMI.  Orders:  No orders of the defined types were placed in this encounter.  No orders of the defined types were placed in this encounter.    Procedures: No procedures  performed  Clinical Data: No additional findings.  ROS:  All other systems negative, except as noted in the HPI. Review of Systems  Objective: Vital Signs: There were no vitals taken for this visit.  Specialty Comments:  No specialty comments available.  PMFS History: Patient Active Problem List   Diagnosis Date Noted   Acute osteomyelitis of right foot (HCC) 08/28/2022   Osteomyelitis of foot, right, acute (HCC) 08/28/2022   Cutaneous abscess of right foot 07/31/2022   Abscess of right foot 07/31/2022   Acute heart failure with preserved ejection fraction (HFpEF) (HCC) 02/20/2022   Acute respiratory failure with hypoxia (HCC) 02/20/2022   AKI (acute kidney injury) (HCC) 02/15/2022   Aspiration pneumonia (HCC) 02/15/2022   Leukocytosis 02/15/2022   Falls frequently 02/15/2022   SIRS (systemic inflammatory response syndrome) (HCC) 08/12/2020   Mild protein malnutrition (HCC) 08/12/2020   Sepsis (HCC) 07/25/2020   Sepsis with acute hypoxic  respiratory failure without septic shock (HCC)    Dementia without behavioral disturbance (HCC)    Palliative care by specialist    Goals of care, counseling/discussion    Pneumonia of right lower lobe due to infectious organism 11/06/2019   Intracerebral hematoma (HCC) 07/04/2019   Closed head injury 07/03/2019   Thrombocytopenia (HCC) 07/03/2019   Thyrotoxicosis, unspecified with thyrotoxic crisis or storm 01/15/2019   History of syndrome of inappropriate antidiuretic hormone (SIADH) 01/15/2019   UTI (urinary tract infection) 01/04/2018   Neuromuscular disorder (HCC)    Dysuria 10/30/2017   Fever 08/15/2017   Hypokalemia 08/15/2017   Altered mental state 08/15/2017   Acute metabolic encephalopathy 08/15/2017   Valproic acid toxicity 08/15/2017   Rectal cancer (HCC) 07/10/2017   Chronic deep vein thrombosis (DVT) of distal vein of lower extremity (HCC) 07/10/2017   Altered mental status    Ischemic stroke (HCC)    GERD (gastroesophageal reflux disease) 05/12/2017   GI bleed    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   Dysplastic rectal polyp    Protein-calorie malnutrition, severe 04/07/2017   Pressure injury of skin 04/04/2017   Anticoagulated 04/03/2017   Stool bloody 04/03/2017   Current every day smoker 04/03/2017   Rectal bleeding    Chronic diarrhea    Hyperthyroidism    S/P ORIF (open reduction internal fixation) fracture right hip IM nail 03/07/17 03/24/2017   Hypomagnesemia 03/07/2017   Fall    Closed intertrochanteric fracture of hip, right, initial encounter (HCC) 03/06/2017   Radial styloid fracture: right 03/06/2017   Normocytic anemia 03/06/2017   Orthostatic hypotension 06/07/2015   Lower urinary tract infectious disease 06/07/2015   Rhabdomyolysis 06/07/2015   White matter abnormality on MRI of brain 05/14/2013   History of depression 05/14/2013   Hx of anxiety disorder 05/14/2013   Bipolar I disorder, most recent episode (or  current) manic (HCC) 05/14/2013   Bipolar 1 disorder (HCC) 05/14/2013   Hyponatremia 05/12/2013   Lacunar infarct, acute (HCC) 05/12/2013   Past Medical History:  Diagnosis Date   Anxiety    Arthritis    Bipolar 1 disorder (HCC)    Chronic deep vein thrombosis (DVT) of distal vein of lower extremity (HCC) 07/10/2017   Chronic hip pain    Depression    GI bleed    Headache(784.0)    History of UTI/bladder spams.    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   Thyrotoxicosis, unspecified with thyrotoxic crisis or storm 01/15/2019    Family History  Problem Relation Age of Onset   Breast cancer Mother    Cancer - Other Mother    Alcoholism Father    Colon cancer Neg Hx    Colon polyps Neg Hx     Past Surgical History:  Procedure Laterality Date   COLONOSCOPY WITH PROPOFOL N/A 04/05/2017   Procedure: COLONOSCOPY WITH PROPOFOL;  Surgeon: Malissa Hippo, MD;  [facility];  Service: Endoscopy;  Laterality: N/A;   I & D EXTREMITY Right 07/31/2022   Procedure: RIGHT FOOT DEBRIDEMENT AND TISSUE GRAFT;  Surgeon: [name], MD;  Location: [facility] OR;  Service: Orthopedics;  Laterality: Right;   I & D EXTREMITY Right 08/03/2022   Procedure: RIGHT FOOT DEBRIDEMENT;  Surgeon: [name], MD;  Location: [facility] OR;  Service: Orthopedics;  Laterality: Right;   I & D EXTREMITY Right 08/28/2022   Procedure: RIGHT FOOT DEBRIDEMENT;  Surgeon: [name], MD;  Location: [facility] OR;  Service: Orthopedics;  Laterality: Right;   I & D EXTREMITY Right 08/30/2022   Procedure: RIGHT FOOT DEBRIDEMENT;  Surgeon: [name], MD;  Location: [facility] OR;   Service: Orthopedics;  Laterality: Right;   INTRAMEDULLARY (IM) NAIL INTERTROCHANTERIC Right 03/07/2017   Procedure: OPEN TREATMENT INTERNAL FIXATION RIGHT HIP WITH GAMA INTRAMEDULARY NAIL;  Surgeon: Vickki Hearing, MD;  Location: [facility] ORS;  Service: Orthopedics;  Laterality: Right;   MULTIPLE EXTRACTIONS WITH ALVEOLOPLASTY N/A 10/30/2012   Procedure: MULTIPLE EXTRACION 5, 6, 8, 9, 10 ,18, 19, 31 WITH MAXILLARY RIGHT AND LEFT  ALVEOLOPLASTY REDUCE MAXILLARY LEFT TUBEROSITY;  Surgeon: Georgia Lopes, DDS;  Location: [facility] OR;  Service: Oral Surgery;  Laterality: N/A;   POLYPECTOMY  04/05/2017   Procedure: POLYPECTOMY;  Surgeon: Malissa Hippo, MD;  [facility];  Service: Endoscopy;;  recto-sigmoid, rectum   Social History   Occupational History   Not on file  Tobacco Use   Smoking status: Former    Current packs/day: 0.00    Average packs/day: 0.5 packs/day for 20.0 years (10.0 ttl pk-yrs)    Types: Cigarettes    Start date: 05/05/1997    Quit date: 05/05/2017    Years since quitting: 5.6   Smokeless tobacco: Never   Tobacco comments:    patient states she quit one week ago  Vaping Use   Vaping status: Never Used  Substance and Sexual Activity   Alcohol use: No   Drug use: No   Sexual activity: Not on file

## 2022-12-30 ENCOUNTER — Ambulatory Visit: Payer: Self-pay | Admitting: *Deleted

## 2022-12-30 NOTE — Patient Outreach (Signed)
  Care Coordination   12/30/2022 Name: Cassidy Smith MRN: [id number] DOB: 03/02/1955   Care Coordination Outreach Attempts:  An unsuccessful telephone outreach was attempted for a scheduled appointment today.  Follow Up Plan:  Additional outreach attempts will be made to offer the patient care coordination information and services.   Encounter Outcome:  No Answer   Care Coordination Interventions:  No, not indicated    Demetrios Loll, RN, BSN Care Manager [place]  [facility]  Population [facility]  Direct Dial: [phone] Main #: [phone]

## 2023-01-28 ENCOUNTER — Ambulatory Visit: Payer: Medicare Other | Admitting: Family

## 2023-01-28 ENCOUNTER — Other Ambulatory Visit (INDEPENDENT_AMBULATORY_CARE_PROVIDER_SITE_OTHER): Payer: Self-pay

## 2023-01-28 DIAGNOSIS — L89896 Pressure-induced deep tissue damage of other site: Secondary | ICD-10-CM | POA: Diagnosis not present

## 2023-01-28 DIAGNOSIS — M5440 Lumbago with sciatica, unspecified side: Secondary | ICD-10-CM | POA: Diagnosis not present

## 2023-01-28 MED ORDER — PREDNISONE 20 MG PO TABS: 20.0000 mg | ORAL_TABLET | Freq: Every day | ORAL | 0 refills | Status: DC

## 2023-01-28 NOTE — Progress Notes (Signed)
 Office Visit Note   Patient: Cassidy Smith           Date of Birth: [birth year]           MRN: [id number] Visit Date: 01/28/2023              Requested by: Orpha Yancey LABOR, MD [address] [place],  [place] [address] PCP: Orpha Yancey LABOR, MD  Chief Complaint  Patient presents with   Right Foot - Wound Check      HPI: The patient is a 68 year old woman who presents in follow-up for chronic right foot ulcer she has been applying dry dressings daily.  Main concern today is some shooting pain she is having from her low back and right hip down the anterior right thigh.  She has pain with prolonged sitting difficulty lying flat  Assessment & Plan: Visit Diagnoses:  1. Acute right-sided low back pain with sciatica, sciatica laterality unspecified     Plan: Will trial her on some prednisone  for her lumbar radiculopathy.  Will have them continue to monitor her foot closely she may advance to regular shoewear  Follow-Up Instructions: No follow-ups on file.   Ortho Exam  Patient is alert, oriented, no adenopathy, well-dressed, normal affect, normal respiratory effort. On examination right foot the ulcer is well-healed this is dry there is a small area of eschar no surrounding erythema no drainage.  She does have a positive straight leg raise on the right there is no focal motor weakness.  No spinous process tenderness  Imaging: No results found.   Labs: Lab Results  Component Value Date   HGBA1C 5.0 06/10/2017   HGBA1C 5.4 05/13/2013   ESRSEDRATE 2 05/13/2013   LABURIC 2.4 04/11/2017   REPTSTATUS 09/02/2022 FINAL 08/28/2022   GRAMSTAIN NO WBC SEEN MODERATE GRAM NEGATIVE RODS  08/28/2022   CULT  08/28/2022    MODERATE PSEUDOMONAS AERUGINOSA FEW PROTEUS PENNERI NO ANAEROBES ISOLATED Performed at [facility], [address], [place], [place] [address]    LABORGA PSEUDOMONAS AERUGINOSA 08/28/2022   LABORGA PROTEUS PENNERI 08/28/2022     Lab Results   Component Value Date   ALBUMIN 2.0 (L) 02/21/2022   ALBUMIN 3.2 (L) 08/12/2020   ALBUMIN 3.3 (L) 07/25/2020    Lab Results  Component Value Date   MG 1.9 02/24/2022   MG 1.5 (L) 02/23/2022   MG 1.6 (L) 02/22/2022   Lab Results  Component Value Date   VD25OH 49.2 03/06/2017    No results found for: PREALBUMIN    Latest Ref Rng & Units 08/01/2022    1:21 AM 07/31/2022    8:39 AM 04/11/2022   12:56 PM  CBC EXTENDED  WBC 4.0 - 10.5 K/uL 7.0  9.3  10.8   RBC 3.87 - 5.11 MIL/uL 3.65  4.08  3.93   Hemoglobin 12.0 - 15.0 g/dL 89.5  88.3  88.3   HCT 36.0 - 46.0 % 33.8  38.1  36.7   Platelets 150 - 400 K/uL 157  160  228   NEUT# 1.7 - 7.7 K/uL   5.7   Lymph# 0.7 - 4.0 K/uL   3.6      There is no height or weight on file to calculate BMI.  Orders:  Orders Placed This Encounter  Procedures   XR Lumbar Spine 2-3 Views   No orders of the defined types were placed in this encounter.    Procedures: No procedures performed  Clinical Data: No additional  findings.  ROS:  All other systems negative, except as noted in the HPI. Review of Systems  Objective: Vital Signs: There were no vitals taken for this visit.  Specialty Comments:  No specialty comments available.  PMFS History: Patient Active Problem List   Diagnosis Date Noted   Acute osteomyelitis of right foot (HCC) 08/28/2022   Osteomyelitis of foot, right, acute (HCC) 08/28/2022   Cutaneous abscess of right foot 07/31/2022   Abscess of right foot 07/31/2022   Acute heart failure with preserved ejection fraction (HFpEF) (HCC) 02/20/2022   Acute respiratory failure with hypoxia (HCC) 02/20/2022   AKI (acute kidney injury) (HCC) 02/15/2022   Aspiration pneumonia (HCC) 02/15/2022   Leukocytosis 02/15/2022   Falls frequently 02/15/2022   SIRS (systemic inflammatory response syndrome) (HCC) 08/12/2020   Mild protein malnutrition (HCC) 08/12/2020   Sepsis (HCC) 07/25/2020   Sepsis with acute hypoxic  respiratory failure without septic shock (HCC)    Dementia without behavioral disturbance (HCC)    Palliative care by specialist    Goals of care, counseling/discussion    Pneumonia of right lower lobe due to infectious organism 11/06/2019   Intracerebral hematoma (HCC) 07/04/2019   Closed head injury 07/03/2019   Thrombocytopenia (HCC) 07/03/2019   Thyrotoxicosis, unspecified with thyrotoxic crisis or storm 01/15/2019   History of syndrome of inappropriate antidiuretic hormone (SIADH) 01/15/2019   UTI (urinary tract infection) 01/04/2018   Neuromuscular disorder (HCC)    Dysuria 10/30/2017   Fever 08/15/2017   Hypokalemia 08/15/2017   Altered mental state 08/15/2017   Acute metabolic encephalopathy 08/15/2017   Valproic acid  toxicity 08/15/2017   Rectal cancer (HCC) 07/10/2017   Chronic deep vein thrombosis (DVT) of distal vein of lower extremity (HCC) 07/10/2017   Altered mental status    Ischemic stroke (HCC)    GERD (gastroesophageal reflux disease) 05/12/2017   GI bleed    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   Dysplastic rectal polyp    Protein-calorie malnutrition, severe 04/07/2017   Pressure injury of skin 04/04/2017   Anticoagulated 04/03/2017   Stool bloody 04/03/2017   Current every day smoker 04/03/2017   Rectal bleeding    Chronic diarrhea    Hyperthyroidism    S/P ORIF (open reduction internal fixation) fracture right hip IM nail 03/07/17 03/24/2017   Hypomagnesemia 03/07/2017   Fall    Closed intertrochanteric fracture of hip, right, initial encounter (HCC) 03/06/2017   Radial styloid fracture: right 03/06/2017   Normocytic anemia 03/06/2017   Orthostatic hypotension 06/07/2015   Lower urinary tract infectious disease 06/07/2015   Rhabdomyolysis 06/07/2015   White matter abnormality on MRI of brain 05/14/2013   History of depression 05/14/2013   Hx of anxiety disorder 05/14/2013   Bipolar I disorder, most recent episode (or  current) manic (HCC) 05/14/2013   Bipolar 1 disorder (HCC) 05/14/2013   Hyponatremia 05/12/2013   Lacunar infarct, acute (HCC) 05/12/2013   Past Medical History:  Diagnosis Date   Anxiety    Arthritis    Bipolar 1 disorder (HCC)    Chronic deep vein thrombosis (DVT) of distal vein of lower extremity (HCC) 07/10/2017   Chronic hip pain    Depression    GI bleed    Headache(784.0)    History of UTI/bladder spams.    Hyperthyroidism    Hypokalemia 08/15/2017   Intracerebral hematoma (HCC) 07/04/2019   Ischemic stroke (HCC)    Neuromuscular disorder (HCC)    shaking of hands    Pneumonia of right  lower lobe due to infectious organism 11/06/2019   Psychosis [facility])    Radial styloid fracture: right 03/06/2017   Rhabdomyolysis 06/07/2015   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   Thyrotoxicosis, unspecified with thyrotoxic crisis or storm 01/15/2019    Family History  Problem Relation Age of Onset   Breast cancer Mother    Cancer - Other Mother    Alcoholism Father    Colon cancer Neg Hx    Colon polyps Neg Hx     Past Surgical History:  Procedure Laterality Date   COLONOSCOPY WITH PROPOFOL  N/A 04/05/2017   Procedure: COLONOSCOPY WITH PROPOFOL ;  Surgeon: Golda Claudis PENNER, MD;  [facility];  Service: Endoscopy;  Laterality: N/A;   I & D EXTREMITY Right 07/31/2022   Procedure: RIGHT FOOT DEBRIDEMENT AND TISSUE GRAFT;  Surgeon: Harden Jerona GAILS, MD;  Location: [facility] OR;  Service: Orthopedics;  Laterality: Right;   I & D EXTREMITY Right 08/03/2022   Procedure: RIGHT FOOT DEBRIDEMENT;  Surgeon: Harden Jerona GAILS, MD;  Location: [facility] OR;  Service: Orthopedics;  Laterality: Right;   I & D EXTREMITY Right 08/28/2022   Procedure: RIGHT FOOT DEBRIDEMENT;  Surgeon: Harden Jerona GAILS, MD;  Location: [facility] OR;  Service: Orthopedics;  Laterality: Right;   I & D EXTREMITY Right 08/30/2022   Procedure: RIGHT FOOT DEBRIDEMENT;  Surgeon: Harden Jerona GAILS, MD;  Location: [facility] OR;   Service: Orthopedics;  Laterality: Right;   INTRAMEDULLARY (IM) NAIL INTERTROCHANTERIC Right 03/07/2017   Procedure: OPEN TREATMENT INTERNAL FIXATION RIGHT HIP WITH GAMA INTRAMEDULARY NAIL;  Surgeon: Margrette Taft BRAVO, MD;  Location: [facility];  Service: Orthopedics;  Laterality: Right;   MULTIPLE EXTRACTIONS WITH ALVEOLOPLASTY N/A 10/30/2012   Procedure: MULTIPLE EXTRACION 5, 6, 8, 9, 10 ,18, 19, 31 WITH MAXILLARY RIGHT AND LEFT  ALVEOLOPLASTY REDUCE MAXILLARY LEFT TUBEROSITY;  Surgeon: Glendia CHRISTELLA Primrose, DDS;  Location: [facility] OR;  Service: Oral Surgery;  Laterality: N/A;   POLYPECTOMY  04/05/2017   Procedure: POLYPECTOMY;  Surgeon: Golda Claudis PENNER, MD;  [facility];  Service: Endoscopy;;  recto-sigmoid, rectum   Social History   Occupational History   Not on file  Tobacco Use   Smoking status: Former    Current packs/day: 0.00    Average packs/day: 0.5 packs/day for 20.0 years (10.0 ttl pk-yrs)    Types: Cigarettes    Start date: 05/05/1997    Quit date: 05/05/2017    Years since quitting: 5.7   Smokeless tobacco: Never   Tobacco comments:    patient states she quit one week ago  Vaping Use   Vaping status: Never Used  Substance and Sexual Activity   Alcohol use: No   Drug use: No   Sexual activity: Not on file

## 2023-02-05 ENCOUNTER — Encounter: Payer: Self-pay | Admitting: Family

## 2023-02-07 ENCOUNTER — Ambulatory Visit: Payer: Self-pay | Admitting: *Deleted

## 2023-02-07 ENCOUNTER — Encounter: Payer: Self-pay | Admitting: *Deleted

## 2023-02-07 NOTE — Patient Outreach (Signed)
  Care Coordination   Follow Up Visit Note   02/07/2023 Name: Cassidy Smith MRN: [id number] DOB: 1955-04-26  Cassidy Smith is a 68 y.o. year old female who sees Hasanaj, Myra Gianotti, MD for primary care. I  spoke with brother/caregiver, [name], by telephone today.   What matters to the patients health and wellness today?  Wound healing    Goals Addressed             This Visit's Progress    Right Ankle Fracture and Wound Healing   On track    Care Coordination Goals: Patient/brother will monitor wound for any signs/symptoms of infection and will notify orthopedic provider if any are noted Caregiver will apply lotion like CeraVe or Eucerin to feet Caregiver will wash foot with mild soap. Do not soak foot.  Patient will keep follow-up with PCP next week Patient will increase activity level as tolerated Patient will practice fall precautions Patient/family will reach out to RN Care Manager at [phone] with any resource or care coordination needs        SDOH assessments and interventions completed:  Yes  SDOH Interventions Today    Flowsheet Row Most Recent Value  SDOH Interventions   Transportation Interventions Patient Resources (Friends/Family)        Care Coordination Interventions:  Yes, provided  Interventions Today    Flowsheet Row Most Recent Value  Chronic Disease   Chronic disease during today's visit Other  [H/O osteomyelitis of right foot. Wound healed with some scabbing, dementia]  General Interventions   General Interventions Discussed/Reviewed General Interventions Discussed, General Interventions Reviewed, Durable Medical Equipment (DME), Doctor Visits  Doctor Visits Discussed/Reviewed Doctor Visits Discussed, Doctor Visits Reviewed, Annual Wellness Visits, PCP, Specialist  [reviewed orthopedic office notes from 01/28/23 and picture of foot. Wound has healed but has some scabbing present.]  Durable Medical Equipment (DME) Wheelchair, Estate manager/land agent  Standard  PCP/Specialist Visits Compliance with follow-up visit  [PCP on 02/11/23. Was released from orthopedic surgeon. Encouraged to reach out to surgeon if patient develops any S/S of infection]  Exercise Interventions   Exercise Discussed/Reviewed Physical Activity, Assistive device use and maintanence  Physical Activity Discussed/Reviewed Physical Activity Discussed, Physical Activity Reviewed  [encouraged to increase physical activity level as tolerated`]  Education Interventions   Education Provided Provided Education  Provided Verbal Education On Nutrition, Exercise, Medication, When to see the doctor, Other  [leave scabs intact to fall off on their own. Monitor for S/S of infection. Apply lotion like Eucerine or CeraVe to top of foot. wash with mild soap and pat dry. Do not soak foot.]  Mental Health Interventions   Mental Health Discussed/Reviewed Mental Health Discussed, Mental Health Reviewed  Nutrition Interventions   Nutrition Discussed/Reviewed Nutrition Discussed, Nutrition Reviewed, Portion sizes, Fluid intake, Adding fruits and vegetables  Pharmacy Interventions   Pharmacy Dicussed/Reviewed Pharmacy Topics Discussed, Pharmacy Topics Reviewed, Medications and their functions  [taking medications as prescribed]  Safety Interventions   Safety Discussed/Reviewed Safety Discussed, Safety Reviewed, Fall Risk, Home Safety  Home Safety Assistive Devices       Follow up plan: Follow up call scheduled for 02/21/23. RN will meet patient/brother at her PCP office visit with Dr Olena Leatherwood on 03/14/23.    Encounter Outcome:  Patient Visit Completed   Demetrios Loll, RN, BSN [place]  [facility], [facility] RN Care Manager Direct Dial: [phone]

## 2023-02-18 ENCOUNTER — Ambulatory Visit: Payer: Self-pay | Admitting: *Deleted

## 2023-02-18 ENCOUNTER — Encounter: Payer: Self-pay | Admitting: *Deleted

## 2023-02-18 NOTE — Patient Outreach (Signed)
  Care Coordination   Follow Up Visit Note   02/18/2023 Name: Cassidy Smith MRN: [id number] DOB: 10-Feb-1955  Ronal LITTIE Ashworth is a 68 y.o. year old female who sees Hasanaj, Yancey LABOR, MD for primary care. I  spoke with patient's brother/caregiver, Elspeth, by telephone today. Patient is doing well and is ambulating more. Wound has healed and she has been released by orthopedic surgeon.   What matters to the patients health and wellness today?  Did not endorse any specific complaints today.     Goals Addressed             This Visit's Progress    Fall Prevention       Care Coordination Goals: Patient will use assistive devices as needed for balance Patient will move carefully and change positions slowly to minimize risk for falling Patient will notify provider of any falls and will seek emergency medical attention if needed Family/Caregivers will have safeguards in place to prevent wandering due to dementia Patient will reach out to RN Care Manager at [phone] with any care coordination or resource needs      COMPLETED: Right Ankle Fracture and Wound Healing       Care Coordination Goals: Patient/brother will monitor wound for any signs/symptoms of infection and will notify orthopedic provider if any are noted Caregiver will apply lotion like CeraVe or Eucerin to feet Caregiver will wash foot with mild soap. Do not soak foot.  Patient will keep follow-up with PCP next week Patient will increase activity level as tolerated Patient will practice fall precautions Patient/family will reach out to RN Care Manager at [phone] with any resource or care coordination needs        SDOH assessments and interventions completed:  Yes  SDOH Interventions Today    Flowsheet Row Most Recent Value  SDOH Interventions   Housing Interventions Intervention Not Indicated        Care Coordination Interventions:  Yes, provided  Interventions Today    Flowsheet Row Most Recent Value   Chronic Disease   Chronic disease during today's visit Other  [dementia]  General Interventions   General Interventions Discussed/Reviewed General Interventions Discussed, General Interventions Reviewed, Doctor Visits, Durable Medical Equipment (DME)  Doctor Visits Discussed/Reviewed Doctor Visits Discussed, Doctor Visits Reviewed, Specialist, Annual Wellness Visits, PCP  Durable Medical Equipment (DME) Wheelchair, Estate Manager/land Agent Standard  PCP/Specialist Visits Compliance with follow-up visit  [Keep routine follow-up appointments with PCP. Has been released from orthopedic surgeon since wound and ankle fracture have healed]  Exercise Interventions   Exercise Discussed/Reviewed Physical Activity  Physical Activity Discussed/Reviewed Physical Activity Discussed, Physical Activity Reviewed  [patient is increasing physical activity level and can ambulate with assistive devices]  Education Interventions   Education Provided Provided Education  Provided Verbal Education On When to see the doctor, Exercise  Safety Interventions   Safety Discussed/Reviewed Safety Discussed, Safety Reviewed, Fall Risk, Home Safety  Home Safety Assistive Devices  [Practice fall precautions. Denies wondering at night. Has a caregiver there with her during the day until bedtime. Encouraged safeguards to prevent wandering at night.]       Follow up plan: Follow up call scheduled for 03/18/23    Encounter Outcome:  Patient Visit Completed   Josette Pellet, RN, BSN [place]  [facility] RN Care Manager Direct Dial: [phone]

## 2023-02-21 ENCOUNTER — Encounter: Payer: Medicare Other | Admitting: *Deleted

## 2023-03-18 ENCOUNTER — Encounter: Payer: Self-pay | Admitting: *Deleted

## 2023-03-27 ENCOUNTER — Ambulatory Visit: Payer: Self-pay | Admitting: *Deleted

## 2023-03-27 ENCOUNTER — Encounter: Payer: Self-pay | Admitting: *Deleted

## 2023-03-27 NOTE — Patient Outreach (Signed)
 Care Coordination   Follow Up Visit Note   03/27/2023 Name: Cassidy Smith MRN: [id number] DOB: 08/04/1955  Cassidy Smith is a 68 y.o. year old female who sees Hasanaj, Myra Gianotti, MD for primary care. I  spoke with brother and caregiver, [name], by telephone today.  What matters to the patients health and wellness today?  No questions or concerns    Goals Addressed             This Visit's Progress    COMPLETED: Fall Prevention   On track    Care Coordination Goals: Patient will use assistive devices as needed for balance Patient will move carefully and change positions slowly to minimize risk for falling Patient will notify provider of any falls and will seek emergency medical attention if needed Family/Caregivers will have safeguards in place to prevent wandering due to dementia Patient will reach out to RN Care Manager at [phone] with any care coordination or resource needs         SDOH assessments and interventions completed:  Yes  SDOH Interventions Today    Flowsheet Row Most Recent Value  SDOH Interventions   Housing Interventions Intervention Not Indicated  Transportation Interventions Patient Resources (Friends/Family)        Care Coordination Interventions:  Yes, provided  Interventions Today    Flowsheet Row Most Recent Value  Chronic Disease   Chronic disease during today's visit Other  [Dementia, h/o CVA]  General Interventions   General Interventions Discussed/Reviewed General Interventions Discussed, General Interventions Reviewed, Doctor Visits, Durable Medical Equipment (DME)  Doctor Visits Discussed/Reviewed Doctor Visits Discussed, Doctor Visits Reviewed, PCP, Annual Wellness Visits, Specialist  Durable Medical Equipment (DME) Wheelchair, Estate manager/land agent Standard  PCP/Specialist Visits Compliance with follow-up visit  Exercise Interventions   Exercise Discussed/Reviewed Physical Activity  Physical Activity Discussed/Reviewed Physical  Activity Discussed, Physical Activity Reviewed  [encouraged continued activity with a goal of 150 minutes per week]  Education Interventions   Education Provided Provided Education  Provided Verbal Education On Exercise, Medication, When to see the doctor, Other  Macy Mis out to Care Management Team with any resource or care management needs]  Nutrition Interventions   Nutrition Discussed/Reviewed Nutrition Discussed, Nutrition Reviewed  Pharmacy Interventions   Pharmacy Dicussed/Reviewed Pharmacy Topics Discussed, Pharmacy Topics Reviewed, Medications and their functions  [continue taking medications as directed]  Safety Interventions   Safety Discussed/Reviewed Safety Discussed, Safety Reviewed, Fall Risk, Home Safety  Home Safety Assistive Devices       Follow up plan: No further intervention required.   Encounter Outcome:  Patient Visit Completed   Demetrios Loll, RN, BSN [place]  [facility], [facility] RN Care Manager Direct Dial: [phone]

## 2023-06-17 ENCOUNTER — Other Ambulatory Visit (HOSPITAL_COMMUNITY): Payer: Self-pay | Admitting: Internal Medicine

## 2023-06-17 DIAGNOSIS — Z1231 Encounter for screening mammogram for malignant neoplasm of breast: Secondary | ICD-10-CM

## 2023-07-30 ENCOUNTER — Ambulatory Visit: Admitting: Obstetrics & Gynecology

## 2023-07-30 ENCOUNTER — Encounter: Payer: Self-pay | Admitting: Obstetrics & Gynecology

## 2023-09-23 ENCOUNTER — Encounter: Admitting: Obstetrics & Gynecology

## 2023-10-11 ENCOUNTER — Emergency Department (HOSPITAL_COMMUNITY)
Admission: EM | Admit: 2023-10-11 | Discharge: 2023-10-11 | Disposition: A | Discharge status: Home / Self Care | Attending: Emergency Medicine | Admitting: Emergency Medicine

## 2023-10-11 ENCOUNTER — Encounter (HOSPITAL_COMMUNITY): Payer: Self-pay

## 2023-10-11 ENCOUNTER — Other Ambulatory Visit: Payer: Self-pay

## 2023-10-11 DIAGNOSIS — Z8673 Personal history of transient ischemic attack (TIA), and cerebral infarction without residual deficits: Secondary | ICD-10-CM | POA: Diagnosis not present

## 2023-10-11 DIAGNOSIS — F0392 Unspecified dementia, unspecified severity, with psychotic disturbance: Secondary | ICD-10-CM | POA: Diagnosis present

## 2023-10-11 DIAGNOSIS — R109 Unspecified abdominal pain: Secondary | ICD-10-CM | POA: Diagnosis not present

## 2023-10-11 DIAGNOSIS — Z7982 Long term (current) use of aspirin: Secondary | ICD-10-CM | POA: Insufficient documentation

## 2023-10-11 DIAGNOSIS — F22 Delusional disorders: Secondary | ICD-10-CM | POA: Insufficient documentation

## 2023-10-11 LAB — COMPREHENSIVE METABOLIC PANEL WITH GFR
ALT: 15 U/L (ref 0–44)
AST: 21 U/L (ref 15–41)
Albumin: 3.7 g/dL (ref 3.5–5.0)
Alkaline Phosphatase: 82 U/L (ref 38–126)
Anion gap: 13 (ref 5–15)
BUN: 19 mg/dL (ref 8–23)
CO2: 27 mmol/L (ref 22–32)
Calcium: 9.9 mg/dL (ref 8.9–10.3)
Chloride: 100 mmol/L (ref 98–111)
Creatinine, Ser: 0.58 mg/dL (ref 0.44–1.00)
GFR, Estimated: >60 mL/min (ref >60)
Glucose, Bld: 89 mg/dL (ref 70–99)
Potassium: 3.4 mmol/L — ABNORMAL LOW (ref 3.5–5.1)
Sodium: 140 mmol/L (ref 135–145)
Total Bilirubin: 0.5 mg/dL (ref 0.0–1.2)
Total Protein: 6.8 g/dL (ref 6.5–8.1)

## 2023-10-11 LAB — URINALYSIS, ROUTINE W REFLEX MICROSCOPIC
Bacteria, UA: NONE SEEN
Bilirubin Urine: NEGATIVE
Glucose, UA: NEGATIVE mg/dL
Hgb urine dipstick: NEGATIVE
Ketones, ur: NEGATIVE mg/dL
Nitrite: NEGATIVE
Protein, ur: NEGATIVE mg/dL
Specific Gravity, Urine: 1.011 (ref 1.005–1.030)
pH: 7 (ref 5.0–8.0)

## 2023-10-11 LAB — CBC WITH DIFFERENTIAL/PLATELET
Abs Immature Granulocytes: 0.01 K/uL (ref 0.00–0.07)
Basophils Absolute: 0 K/uL (ref 0.0–0.1)
Basophils Relative: 1 %
Eosinophils Absolute: 0.2 K/uL (ref 0.0–0.5)
Eosinophils Relative: 4 %
HCT: 39.1 % (ref 36.0–46.0)
Hemoglobin: 12.7 g/dL (ref 12.0–15.0)
Immature Granulocytes: 0 %
Lymphocytes Relative: 49 %
Lymphs Abs: 2.8 K/uL (ref 0.7–4.0)
MCH: 29.7 pg (ref 26.0–34.0)
MCHC: 32.5 g/dL (ref 30.0–36.0)
MCV: 91.6 fL (ref 80.0–100.0)
Monocytes Absolute: 0.5 K/uL (ref 0.1–1.0)
Monocytes Relative: 8 %
Neutro Abs: 2.2 K/uL (ref 1.7–7.7)
Neutrophils Relative %: 38 %
Platelets: 250 K/uL (ref 150–400)
RBC: 4.27 MIL/uL (ref 3.87–5.11)
RDW: 13.1 % (ref 11.5–15.5)
WBC: 5.7 K/uL (ref 4.0–10.5)
nRBC: 0 % (ref 0.0–0.2)

## 2023-10-11 NOTE — ED Triage Notes (Signed)
 Pt brb EMS, c/o abdominal; pt states that she did not tell the truth regarding physical symptoms and wanted to be brought in to talk to someone about her guardianship. EMS states hx of dementia, and bipolar disorder. A&Ox4; pt denies N/V/D, headache.

## 2023-10-11 NOTE — ED Provider Notes (Signed)
 [place] EMERGENCY DEPARTMENT AT [facility] Provider Note   CSN: [id number] Arrival date & time: 10/11/23  9183     History  Chief Complaint  Patient presents with   Abdominal Pain    Cassidy Smith is a 68 y.o. female with PMH as listed below who presents BIB EMS, c/o abdominal pain.  On my assessment of the patient she states that she actually lied about having pain, she states that physically and mentally she feels fine.  Pt states that she did not tell the truth regarding physical symptoms and wanted to be brought in to talk to someone about her guardianship.  She states that she wants to talk to someone who knows about Tree surgeon.  EMS states hx of dementia, and bipolar disorder.  Patient is alert and oriented x 3, endorses that it is September 2025 and we are in [place] .  However during the interview she states that she has a Radiation protection practitioner and that she has been having conversations with President Trump through the TV related to her guardianship case and Social Security. She states that she lives with her brother but he wouldn't bring her to the ED so she had to lie to EMS to get here.    Past Medical History:  Diagnosis Date   Anxiety    Arthritis    Bipolar 1 disorder (HCC)    Chronic deep vein thrombosis (DVT) of distal vein of lower extremity (HCC) 07/10/2017   Chronic hip pain    Depression    GI bleed    Headache(784.0)    History of UTI/bladder spams.    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 Authorizing Provider  acetaminophen -codeine  (TYLENOL  #3) 300-30 MG tablet Take 1 tablet by mouth every 6 (six) hours as needed for moderate pain. 04/10/22   Valdemar Rocky SAUNDERS, NP  aspirin  EC 81 MG tablet Take 1 tablet (81 mg total) by mouth daily with breakfast. Swallow whole. 08/14/20   Pearlean Manus, MD  atorvastatin  (LIPITOR) 20 MG tablet Take 1 tablet (20 mg total) by mouth daily at 6 PM. 06/11/17   Ricky Fines, MD  benztropine  (COGENTIN ) 1 MG tablet Take 1 tablet (1 mg total) by mouth 2 (two) times daily. 01/05/18   Johnson, Clanford L, MD  Cholecalciferol  (VITAMIN D3) 50 MCG (2000 UT) capsule Take 2,000 Units by mouth daily.    [provider]  Cyanocobalamin  (B-12) 5000 MCG CAPS Take 5,000 mcg by mouth daily.    [provider]  divalproex  (DEPAKOTE  ER) 500 MG 24 hr tablet Take 2 tablets (1,000 mg total) by mouth at bedtime. 08/16/17   Evonnie Lenis, MD  furosemide  (LASIX ) 20 MG tablet Take 20 mg by mouth daily.    [provider]  gabapentin  (NEURONTIN ) 100 MG capsule Take 1 capsule (100 mg total) by mouth 3 (three) times daily. 05/13/17   Margrette Taft BRAVO, MD  ipratropium-albuterol  (DUONEB) 0.5-2.5 (3) MG/3ML  SOLN Take 3 mLs by nebulization every 6 (six) hours as needed. 02/24/22   Evonnie Lenis, MD  Menthol -Zinc  Oxide (CALMOSEPTINE) 0.44-20.6 % OINT Apply 1 Application topically daily as needed (bed sores).    [provider]  methimazole  (TAPAZOLE ) 10 MG tablet Take 10 mg by mouth every other day. 07/06/20   [provider]  Multiple Vitamin (MULTIVITAMIN WITH MINERALS) TABS tablet Take 1 tablet by mouth daily. Centrum Silver     [provider]  pantoprazole  (PROTONIX ) 40 MG tablet Take 40 mg by mouth daily.    [provider]  potassium chloride  (KLOR-CON ) 10 MEQ tablet Take 10 mEq by mouth daily. 01/16/22   [provider]  silver   sulfADIAZINE  (SILVADENE ) 1 % cream Apply 1 Application topically daily. 11/13/22   Valdemar Rocky SAUNDERS, NP  traZODone  (DESYREL ) 100 MG tablet Take 100 mg by mouth at bedtime.    [provider]      Allergies    Patient has no known allergies.    Review of Systems   Review of Systems A 10 point review of systems was performed and is negative unless otherwise reported in HPI.  Physical Exam Updated Vital Signs BP (!) 145/65 (BP Location: Right Arm)   Pulse 83   Temp 97.6 F (36.4 C) (Oral) Comment: Simultaneous filing. User may not have seen previous data. Comment (Src): Simultaneous filing. User may not have seen previous data.  Resp 18   Ht 5' 6 (1.676 m)   Wt 54 kg   SpO2 98%   BMI 19.21 kg/m  Physical Exam General: Normal appearing elderly female, lying in bed.  HEENT: NCAT, PERRLA, EOMI, Sclera anicteric, MMM, trachea midline.  Cardiology: RRR, no murmurs/rubs/gallops.  Resp: Normal respiratory rate and effort. CTAB, no wheezes, rhonchi, crackles.  Abd: Soft, non-tender, non-distended. No rebound tenderness or guarding.  GU: Deferred. MSK: No peripheral edema or signs of trauma. Skin: warm, dry.  Neuro: A&Ox3 (self, place, year, but not situation), CNs II-XII grossly intact. MAEs. Sensation grossly intact.   ED Results / Procedures / Treatments   Labs (all labs ordered are listed, but only abnormal results are displayed) Labs Reviewed  URINALYSIS, ROUTINE W REFLEX MICROSCOPIC - Abnormal; Notable for the following components:      Result Value   Leukocytes,Ua TRACE (*)    All other components within normal limits  COMPREHENSIVE METABOLIC PANEL WITH GFR - Abnormal; Notable for the following components:   Potassium 3.4 (*)    All other components within normal limits  CBC WITH DIFFERENTIAL/PLATELET    EKG None  Radiology No results found.  Procedures Procedures    Medications Ordered in ED Medications - No data to display  ED Course/ Medical Decision  Making/ A&P                          Medical Decision Making Amount and/or Complexity of Data Reviewed Labs: ordered. Decision-making details documented in ED Course.    This patient presents to the ED for concern of abdominal pain, delusions, this involves an extensive number of treatment options, and is a complaint that carries with it a high risk of complications and morbidity.  I considered the following differential and admission for this acute, potentially life threatening condition. Overall well-appearing.  MDM:    Patient reports that she lied about her abdominal pain and she has no abdominal pain. Sometimes patient's complaints are rooted in truth, so evaluated with basic labs  and urine which are unremarkable. No UTI. She is overall well-appearing and HDS. She is taking PO. She is able to real month/year/location, however she during the interview endorses needing to get to [place]  because President trump is waiting for her. She is having delusions, do not believe patient is oriented to situation nor that she can make her own decisions. Patient has h/o dementia and lives with family. Will discuss with family. No signs of head trauma, no report of head trauma.   Clinical Course as of 10/11/23 1329  Sat Oct 11, 2023  0909 Per chart review, there is no record of patient having a guardian, there are no past notes to reflect this. She lives with family and has a caregiver in the home for majority of the day, per previous notes  [HN]  1034 Attempted to call patient's brother [name] cooter but he did not answer, could not leave voicemail because voicemail box is full. [HN]  1034 CMP, CBC unremarkable [HN]  1108 Urinalysis, Routine w reflex microscopic -Urine, Clean Catch(!) Neg UTI [HN]  1141 Talked with patient's elder brother Garrel cooter on the phone. Patient lives with him. He states that over the last several weeks, she hasn't been taking her medicine and refuses it, more angry,  worried that her medicine is laced with fentanyl . She has been talking about being on the phone with president trump and elon musk, that she needs to get to [place]  [place] to sign the bill that they are holding for her signature. He states that she hasn't complained about any abdominal pain until this morning and already had called 911. She has a habit of calling 911. He states that she was diagnosed with a UTI this last week and was treated w/ antibiotics, but they had a hard time giving her her medicine. Reassured him that UA appears okay now, no fever or leukocytosis.   Brother states that they are trying to keep her out of a facility. He states that she has adequate back-up at home with home nursing aids in the morning from 7-1p, and a 2nd aid from 4-9p. Medical staff comes out about once per day. He states that she saw her PCP last week where he mentioned her recently worsening symptoms and PCP felt due to dementia, told them it would likely continue to get worse. Patient's brother states he is ready to accept her back at home. Discussed DC instructions/return precautions.  [HN]    Clinical Course User Index [HN] Franklyn Sid SAILOR, MD    Labs: I Ordered, and personally interpreted labs.  The pertinent results include: Those listed above  Additional history obtained from chart review, EMS.   Reevaluation: After the interventions noted above, I reevaluated the patient and found that they have :stayed the same  Social Determinants of Health: Lives with brother  Disposition:  DC into care of her brother  Co morbidities that complicate the patient evaluation  Past Medical History:  Diagnosis Date   Anxiety    Arthritis    Bipolar 1 disorder (HCC)    Chronic deep vein thrombosis (DVT) of distal vein of lower extremity (HCC) 07/10/2017   Chronic hip pain    Depression    GI bleed    Headache(784.0)    History of UTI/bladder spams.    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   I have reviewed the patients home medicines and have made adjustments as needed  Problem List / ED Course: Problem List Items Addressed This Visit   None Visit Diagnoses       Delusions (HCC)    -  Primary     Dementia with psychotic disturbance, unspecified dementia severity, unspecified dementia type (HCC)                       This note was created using dictation software, which may contain spelling or grammatical errors.    Franklyn Sid SAILOR, MD 10/11/23 1332

## 2023-10-11 NOTE — Discharge Instructions (Signed)
 Thank you for coming to [facility] Emergency Department. You were seen for concerns about dementia. Please continue to take your medications as prescribed. Please follow up with your primary care provider within 1 week.   Do not hesitate to return to the ED or call 911 if you experience: -Worsening symptoms -Lightheadedness, passing out -Fevers/chills -Nausea/vomiting -Anything else that concerns you

## 2023-10-11 NOTE — ED Notes (Signed)
 Pt has been placed on convo list for transport home

## 2023-10-11 NOTE — ED Notes (Signed)
 Attempted to call both brothers, Elspeth ad Garrel, but no answer.

## 2023-11-17 ENCOUNTER — Encounter: Admitting: Obstetrics & Gynecology

## 2023-12-26 ENCOUNTER — Observation Stay (HOSPITAL_COMMUNITY)

## 2023-12-26 ENCOUNTER — Other Ambulatory Visit: Payer: Self-pay

## 2023-12-26 ENCOUNTER — Emergency Department (HOSPITAL_COMMUNITY)

## 2023-12-26 ENCOUNTER — Observation Stay (HOSPITAL_COMMUNITY)
Admission: EM | Admit: 2023-12-26 | Discharge: 2023-12-27 | Disposition: A | Attending: Hospitalist | Admitting: Hospitalist

## 2023-12-26 ENCOUNTER — Encounter (HOSPITAL_COMMUNITY): Payer: Self-pay | Admitting: Emergency Medicine

## 2023-12-26 DIAGNOSIS — R531 Weakness: Secondary | ICD-10-CM | POA: Diagnosis present

## 2023-12-26 DIAGNOSIS — Z86718 Personal history of other venous thrombosis and embolism: Secondary | ICD-10-CM | POA: Insufficient documentation

## 2023-12-26 DIAGNOSIS — F0390 Unspecified dementia without behavioral disturbance: Secondary | ICD-10-CM

## 2023-12-26 DIAGNOSIS — Z87891 Personal history of nicotine dependence: Secondary | ICD-10-CM | POA: Insufficient documentation

## 2023-12-26 DIAGNOSIS — Z79899 Other long term (current) drug therapy: Secondary | ICD-10-CM | POA: Diagnosis not present

## 2023-12-26 DIAGNOSIS — G459 Transient cerebral ischemic attack, unspecified: Secondary | ICD-10-CM

## 2023-12-26 DIAGNOSIS — R296 Repeated falls: Secondary | ICD-10-CM

## 2023-12-26 DIAGNOSIS — R2990 Unspecified symptoms and signs involving the nervous system: Secondary | ICD-10-CM

## 2023-12-26 DIAGNOSIS — R262 Difficulty in walking, not elsewhere classified: Secondary | ICD-10-CM | POA: Diagnosis not present

## 2023-12-26 DIAGNOSIS — E039 Hypothyroidism, unspecified: Secondary | ICD-10-CM | POA: Diagnosis not present

## 2023-12-26 DIAGNOSIS — F319 Bipolar disorder, unspecified: Secondary | ICD-10-CM | POA: Diagnosis not present

## 2023-12-26 DIAGNOSIS — M6281 Muscle weakness (generalized): Secondary | ICD-10-CM | POA: Diagnosis not present

## 2023-12-26 DIAGNOSIS — Z7982 Long term (current) use of aspirin: Secondary | ICD-10-CM | POA: Insufficient documentation

## 2023-12-26 LAB — ECHOCARDIOGRAM COMPLETE
AR max vel: 2.65 cm2
AV Area VTI: 2.88 cm2
AV Area mean vel: 2.92 cm2
AV Mean grad: 3 mmHg
AV Peak grad: 4.2 mmHg
Ao pk vel: 1.03 m/s
Area-P 1/2: 3.45 cm2
Height: 66 in
S' Lateral: 2.73 cm
Weight: 1904.77 [oz_av]

## 2023-12-26 LAB — DIFFERENTIAL
Abs Immature Granulocytes: 0.03 K/uL (ref 0.00–0.07)
Basophils Absolute: 0 K/uL (ref 0.0–0.1)
Basophils Relative: 0 %
Eosinophils Absolute: 0 K/uL (ref 0.0–0.5)
Eosinophils Relative: 0 %
Immature Granulocytes: 0 %
Lymphocytes Relative: 16 %
Lymphs Abs: 1.8 K/uL (ref 0.7–4.0)
Monocytes Absolute: 0.4 K/uL (ref 0.1–1.0)
Monocytes Relative: 4 %
Neutro Abs: 9.1 K/uL — ABNORMAL HIGH (ref 1.7–7.7)
Neutrophils Relative %: 80 %

## 2023-12-26 LAB — CBG MONITORING, ED
Glucose-Capillary: 115 mg/dL — ABNORMAL HIGH (ref 70–99)
Glucose-Capillary: 95 mg/dL (ref 70–99)

## 2023-12-26 LAB — ETHANOL: Alcohol, Ethyl (B): <15 mg/dL (ref <15)

## 2023-12-26 LAB — URINE DRUG SCREEN
Amphetamines: NEGATIVE
Barbiturates: NEGATIVE
Benzodiazepines: NEGATIVE
Cocaine: NEGATIVE
Fentanyl: NEGATIVE
Methadone Scn, Ur: NEGATIVE
Opiates: NEGATIVE
Tetrahydrocannabinol: NEGATIVE

## 2023-12-26 LAB — CBC
HCT: 44.5 % (ref 36.0–46.0)
Hemoglobin: 14.2 g/dL (ref 12.0–15.0)
MCH: 29.6 pg (ref 26.0–34.0)
MCHC: 31.9 g/dL (ref 30.0–36.0)
MCV: 92.9 fL (ref 80.0–100.0)
Platelets: 231 K/uL (ref 150–400)
RBC: 4.79 MIL/uL (ref 3.87–5.11)
RDW: 13 % (ref 11.5–15.5)
WBC: 11.4 K/uL — ABNORMAL HIGH (ref 4.0–10.5)
nRBC: 0 % (ref 0.0–0.2)

## 2023-12-26 LAB — COMPREHENSIVE METABOLIC PANEL WITH GFR
ALT: 13 U/L (ref 0–44)
AST: 23 U/L (ref 15–41)
Albumin: 4.6 g/dL (ref 3.5–5.0)
Alkaline Phosphatase: 113 U/L (ref 38–126)
Anion gap: 9 (ref 5–15)
BUN: 20 mg/dL (ref 8–23)
CO2: 30 mmol/L (ref 22–32)
Calcium: 10.1 mg/dL (ref 8.9–10.3)
Chloride: 101 mmol/L (ref 98–111)
Creatinine, Ser: 0.75 mg/dL (ref 0.44–1.00)
GFR, Estimated: >60 mL/min (ref >60)
Glucose, Bld: 144 mg/dL — ABNORMAL HIGH (ref 70–99)
Potassium: 4.3 mmol/L (ref 3.5–5.1)
Sodium: 139 mmol/L (ref 135–145)
Total Bilirubin: 0.5 mg/dL (ref 0.0–1.2)
Total Protein: 7.7 g/dL (ref 6.5–8.1)

## 2023-12-26 LAB — PROTIME-INR
INR: 0.9 (ref 0.8–1.2)
Prothrombin Time: 12.5 s (ref 11.4–15.2)

## 2023-12-26 LAB — TSH: TSH: 0.317 u[IU]/mL — ABNORMAL LOW (ref 0.350–4.500)

## 2023-12-26 LAB — APTT: aPTT: 24 s (ref 24–36)

## 2023-12-26 LAB — HIV ANTIBODY (ROUTINE TESTING W REFLEX): HIV Screen 4th Generation wRfx: NONREACTIVE

## 2023-12-26 LAB — GLUCOSE, CAPILLARY: Glucose-Capillary: 82 mg/dL (ref 70–99)

## 2023-12-26 MED ORDER — DIVALPROEX SODIUM ER 500 MG PO TB24: 1000.0000 mg | ORAL_TABLET | Freq: Every day | ORAL | Status: DC

## 2023-12-26 MED ORDER — ENOXAPARIN SODIUM 40 MG/0.4ML IJ SOSY
40.0000 mg | PREFILLED_SYRINGE | INTRAMUSCULAR | Status: DC
  Administered 2023-12-26 – 2023-12-27 (×2): 40 mg via SUBCUTANEOUS
  Filled 2023-12-26 (×2): qty 0.4

## 2023-12-26 MED ORDER — ATORVASTATIN CALCIUM 20 MG PO TABS: 20.0000 mg | ORAL_TABLET | Freq: Every day | ORAL | Status: DC

## 2023-12-26 MED ORDER — ZIPRASIDONE HCL 20 MG PO CAPS
20.0000 mg | ORAL_CAPSULE | Freq: Two times a day (BID) | ORAL | Status: DC
  Administered 2023-12-27: 20 mg via ORAL
  Filled 2023-12-26 (×3): qty 1

## 2023-12-26 MED ORDER — BENZTROPINE MESYLATE 1 MG PO TABS
1.0000 mg | ORAL_TABLET | Freq: Two times a day (BID) | ORAL | Status: DC
  Administered 2023-12-27: 1 mg via ORAL
  Filled 2023-12-26: qty 1

## 2023-12-26 MED ORDER — IPRATROPIUM-ALBUTEROL 0.5-2.5 (3) MG/3ML IN SOLN: 3.0000 mL | Freq: Four times a day (QID) | RESPIRATORY_TRACT | Status: DC | PRN

## 2023-12-26 MED ORDER — STROKE: EARLY STAGES OF RECOVERY BOOK
Freq: Once | Status: AC
  Filled 2023-12-26: qty 1

## 2023-12-26 MED ORDER — GABAPENTIN 100 MG PO CAPS
100.0000 mg | ORAL_CAPSULE | Freq: Three times a day (TID) | ORAL | Status: DC
  Administered 2023-12-27: 100 mg via ORAL
  Filled 2023-12-26: qty 1

## 2023-12-26 MED ORDER — ACETAMINOPHEN 160 MG/5ML PO SOLN: 650.0000 mg | ORAL | Status: DC | PRN

## 2023-12-26 MED ORDER — FUROSEMIDE 20 MG PO TABS
20.0000 mg | ORAL_TABLET | Freq: Every day | ORAL | Status: DC
  Administered 2023-12-27: 20 mg via ORAL
  Filled 2023-12-26: qty 1

## 2023-12-26 MED ORDER — VITAMIN D 25 MCG (1000 UNIT) PO TABS
2000.0000 [IU] | ORAL_TABLET | Freq: Every day | ORAL | Status: DC
  Administered 2023-12-27: 2000 [IU] via ORAL
  Filled 2023-12-26: qty 2

## 2023-12-26 MED ORDER — VITAMIN B-12 1000 MCG PO TABS
1000.0000 ug | ORAL_TABLET | Freq: Every day | ORAL | Status: DC
  Administered 2023-12-27: 1000 ug via ORAL
  Filled 2023-12-26: qty 1

## 2023-12-26 MED ORDER — ACETAMINOPHEN 325 MG PO TABS: 650.0000 mg | ORAL_TABLET | ORAL | Status: DC | PRN

## 2023-12-26 MED ORDER — ASPIRIN 325 MG PO TBEC
325.0000 mg | DELAYED_RELEASE_TABLET | Freq: Once | ORAL | Status: AC
  Administered 2023-12-26: 325 mg via ORAL
  Filled 2023-12-26: qty 1

## 2023-12-26 MED ORDER — B-12 5000 MCG PO CAPS: 5000.0000 ug | ORAL_CAPSULE | Freq: Every day | ORAL | Status: DC

## 2023-12-26 MED ORDER — IOHEXOL 350 MG/ML SOLN
75.0000 mL | Freq: Once | INTRAVENOUS | Status: AC | PRN
  Administered 2023-12-26: 75 mL via INTRAVENOUS

## 2023-12-26 MED ORDER — SODIUM CHLORIDE 0.9 % IV SOLN: INTRAVENOUS | Status: AC

## 2023-12-26 MED ORDER — SENNOSIDES-DOCUSATE SODIUM 8.6-50 MG PO TABS: 1.0000 | ORAL_TABLET | Freq: Every evening | ORAL | Status: DC | PRN

## 2023-12-26 MED ORDER — PANTOPRAZOLE SODIUM 40 MG PO TBEC
40.0000 mg | DELAYED_RELEASE_TABLET | Freq: Every day | ORAL | Status: DC
  Administered 2023-12-27: 40 mg via ORAL
  Filled 2023-12-26: qty 1

## 2023-12-26 MED ORDER — CLOPIDOGREL BISULFATE 75 MG PO TABS
300.0000 mg | ORAL_TABLET | Freq: Once | ORAL | Status: AC
  Administered 2023-12-26: 300 mg via ORAL
  Filled 2023-12-26: qty 4

## 2023-12-26 MED ORDER — ACETAMINOPHEN 650 MG RE SUPP: 650.0000 mg | RECTAL | Status: DC | PRN

## 2023-12-26 NOTE — ED Notes (Signed)
 In & Out performed by nurse with CNA, pt drained . Warm blanket given for comfort.

## 2023-12-26 NOTE — H&P (Signed)
 History and Physical    Cassidy Smith:[id number] DOB: 1955/04/02 DOA: 12/26/2023  PCP: Orpha Yancey LABOR, MD   Patient coming from: Home  I have personally briefly reviewed patient's old medical records in [facility]  Chief Complaint: Stroke symptoms.  HPI: HA Cassidy Smith is a 68 y.o. female with medical history significant of ICH 2021, CVA, SIADH, hypothyroidism who was brought by EMS due to concern for left-sided weakness and slurred speech.  History obtained from ER information as well as from her brother over the phone. He said that the patient was apparently okay last night but sometime in the morning her aide found her altered.  She was not speaking normally.  Also she was having some left-sided weakness. EMS was activated.  Her vital signs were stable in the ER.  Thrombolytics were not given as she was out of window.  CTA head and neck negative for large vessel occlusion or significant stenosis.  No bleeding.  Subsequent MRI is also negative for acute stroke or other acute findings.  Patient admitted for further workup.  Her brother is Cassidy Smith says that she is able to ambulate some inside the house.  He said that she had issues with ankle fracture in the past.   Review of Systems: As per HPI otherwise all other systems were reviewed and are negative.   Past Medical History:  Diagnosis Date   Anxiety    Arthritis    Bipolar 1 disorder (HCC)    Chronic deep vein thrombosis (DVT) of distal vein of lower extremity (HCC) 07/10/2017   Chronic hip pain    Depression    GI bleed    Headache(784.0)    History of UTI/bladder spams.    Hyperthyroidism    Hypokalemia 08/15/2017   Intracerebral hematoma (HCC) 07/04/2019   Ischemic stroke (HCC)    Neuromuscular disorder (HCC)    shaking of hands    Pneumonia of right lower lobe due to infectious organism 11/06/2019   Psychosis [facility])    Radial styloid fracture: right 03/06/2017   Rhabdomyolysis 06/07/2015   SIADH  (syndrome of inappropriate ADH production) 04/08/2017   Thyrotoxicosis, unspecified with thyrotoxic crisis or storm 01/15/2019    Past Surgical History:  Procedure Laterality Date   COLONOSCOPY WITH PROPOFOL  N/A 04/05/2017   Procedure: COLONOSCOPY WITH PROPOFOL ;  Surgeon: Golda Claudis PENNER, MD;  Location: [facility]ITE;  Service: Endoscopy;  Laterality: N/A;   I & D EXTREMITY Right 07/31/2022   Procedure: RIGHT FOOT DEBRIDEMENT AND TISSUE GRAFT;  Surgeon: Harden Jerona GAILS, MD;  Location: [facility] OR;  Service: Orthopedics;  Laterality: Right;   I & D EXTREMITY Right 08/03/2022   Procedure: RIGHT FOOT DEBRIDEMENT;  Surgeon: Harden Jerona GAILS, MD;  Location: [facility] OR;  Service: Orthopedics;  Laterality: Right;   I & D EXTREMITY Right 08/28/2022   Procedure: RIGHT FOOT DEBRIDEMENT;  Surgeon: Harden Jerona GAILS, MD;  Location: [facility] OR;  Service: Orthopedics;  Laterality: Right;   I & D EXTREMITY Right 08/30/2022   Procedure: RIGHT FOOT DEBRIDEMENT;  Surgeon: Harden Jerona GAILS, MD;  Location: [facility] OR;  Service: Orthopedics;  Laterality: Right;   INTRAMEDULLARY (IM) NAIL INTERTROCHANTERIC Right 03/07/2017   Procedure: OPEN TREATMENT INTERNAL FIXATION RIGHT HIP WITH GAMA INTRAMEDULARY NAIL;  Surgeon: Margrette Taft BRAVO, MD;  Location: [facility] ORS;  Service: Orthopedics;  Laterality: Right;   MULTIPLE EXTRACTIONS WITH ALVEOLOPLASTY N/A 10/30/2012   Procedure: MULTIPLE EXTRACION 5, 6, 8, 9, 10 ,18, 19, 31 WITH MAXILLARY RIGHT  AND LEFT  ALVEOLOPLASTY REDUCE MAXILLARY LEFT TUBEROSITY;  Surgeon: Glendia CHRISTELLA Primrose, DDS;  Location: [facility] OR;  Service: Oral Surgery;  Laterality: N/A;   POLYPECTOMY  04/05/2017   Procedure: POLYPECTOMY;  Surgeon: Golda Claudis PENNER, MD;  Location: [facility];  Service: Endoscopy;;  recto-sigmoid, rectum     reports that she quit smoking about 6 years ago. Her smoking use included cigarettes. She started smoking about 26 years ago. She has a 10 pack-year smoking history. She has never used smokeless tobacco. She  reports that she does not drink alcohol and does not use drugs.  Allergies[1]  Family History  Problem Relation Age of Onset   Alcoholism Father    Psychosis Mother    Breast cancer Mother    Cancer - Other Mother    Psychosis Brother    Colon cancer Sister    Colon polyps Neg Hx     Prior to Admission medications  Medication Sig Start Date End Date Taking? Authorizing Provider  atorvastatin  (LIPITOR) 20 MG tablet Take 1 tablet (20 mg total) by mouth daily at 6 PM. 06/11/17  Yes Ricky Fines, MD  benztropine  (COGENTIN ) 1 MG tablet Take 1 tablet (1 mg total) by mouth 2 (two) times daily. 01/05/18  Yes Johnson, Clanford L, MD  divalproex  (DEPAKOTE  ER) 500 MG 24 hr tablet Take 2 tablets (1,000 mg total) by mouth at bedtime. 08/16/17  Yes Tat, Alm, MD  furosemide  (LASIX ) 20 MG tablet Take 20 mg by mouth daily.   Yes [provider]  gabapentin  (NEURONTIN ) 100 MG capsule Take 1 capsule (100 mg total) by mouth 3 (three) times daily. 05/13/17  Yes Margrette Taft BRAVO, MD  INVEGA SUSTENNA 234 MG/1.5ML injection Inject into the muscle. 11/17/23  Yes [provider]  methimazole  (TAPAZOLE ) 10 MG tablet Take 10 mg by mouth every other day. 07/06/20  Yes [provider]  pantoprazole  (PROTONIX ) 40 MG tablet Take 40 mg by mouth daily.   Yes [provider]  potassium chloride  (KLOR-CON ) 10 MEQ tablet Take 10 mEq by mouth daily. 01/16/22  Yes [provider]  silver  sulfADIAZINE  (SILVADENE ) 1 % cream Apply 1 Application topically daily. 11/13/22  Yes Valdemar Longs R, NP  traZODone  (DESYREL ) 100 MG tablet Take 100 mg by mouth at bedtime.   Yes [provider]  ziprasidone (GEODON) 20 MG capsule Take 20 mg by mouth 2 (two) times daily. 12/12/23  Yes [provider]  acetaminophen -codeine  (TYLENOL  #3) 300-30 MG tablet Take 1 tablet by mouth every 6 (six) hours as needed for moderate pain. 04/10/22   Valdemar Longs SAUNDERS, NP  aspirin  EC 81 MG tablet Take  1 tablet (81 mg total) by mouth daily with breakfast. Swallow whole. 08/14/20   Pearlean Manus, MD  Cholecalciferol  (VITAMIN D3) 50 MCG (2000 UT) capsule Take 2,000 Units by mouth daily.    [provider]  Cyanocobalamin  (B-12) 5000 MCG CAPS Take 5,000 mcg by mouth daily.    [provider]  ipratropium-albuterol  (DUONEB) 0.5-2.5 (3) MG/3ML SOLN Take 3 mLs by nebulization every 6 (six) hours as needed. 02/24/22   Evonnie Alm, MD  Menthol -Zinc  Oxide (CALMOSEPTINE) 0.44-20.6 % OINT Apply 1 Application topically daily as needed (bed sores).    [provider]  Multiple Vitamin (MULTIVITAMIN WITH MINERALS) TABS tablet Take 1 tablet by mouth daily. Centrum Silver     [provider]    Physical Exam: Vitals:   12/26/23 0936 12/26/23 0941  BP: (!) 174/87  Pulse: 88   Resp: (!) 22   Temp: 97.6 F (36.4 C)   TempSrc: Oral   SpO2: 95% 95%    Constitutional: NAD, calm, comfortable Vitals:   12/26/23 0936 12/26/23 0941  BP: (!) 174/87   Pulse: 88   Resp: (!) 22   Temp: 97.6 F (36.4 C)   TempSrc: Oral   SpO2: 95% 95%     Labs on Admission: I have personally reviewed following labs and imaging studies  CBC: Recent Labs  Lab 12/26/23 0910  WBC 11.4*  NEUTROABS 9.1*  HGB 14.2  HCT 44.5  MCV 92.9  PLT 231   Basic Metabolic Panel: Recent Labs  Lab 12/26/23 0910  NA 139  K 4.3  CL 101  CO2 30  GLUCOSE 144*  BUN 20  CREATININE 0.75  CALCIUM  10.1   GFR: CrCl cannot be calculated (Unknown ideal weight.). Liver Function Tests: Recent Labs  Lab 12/26/23 0910  AST 23  ALT 13  ALKPHOS 113  BILITOT 0.5  PROT 7.7  ALBUMIN 4.6   No results for input(s): LIPASE, AMYLASE in the last 168 hours. No results for input(s): AMMONIA in the last 168 hours. Coagulation Profile: Recent Labs  Lab 12/26/23 0910  INR 0.9   Cardiac Enzymes: No results for input(s): CKTOTAL, CKMB, CKMBINDEX, TROPONINI in the last 168 hours. BNP  (last 3 results) No results for input(s): PROBNP in the last 8760 hours. HbA1C: No results for input(s): HGBA1C in the last 72 hours. CBG: Recent Labs  Lab 12/26/23 0905  GLUCAP 115*   Lipid Profile: No results for input(s): CHOL, HDL, LDLCALC, TRIG, CHOLHDL, LDLDIRECT in the last 72 hours. Thyroid  Function Tests: No results for input(s): TSH, T4TOTAL, FREET4, T3FREE, THYROIDAB in the last 72 hours. Anemia Panel: No results for input(s): VITAMINB12, FOLATE, FERRITIN, TIBC, IRON, RETICCTPCT in the last 72 hours. Urine analysis:    Component Value Date/Time   COLORURINE YELLOW 10/11/2023 1000   APPEARANCEUR CLEAR 10/11/2023 1000   LABSPEC 1.011 10/11/2023 1000   PHURINE 7.0 10/11/2023 1000   GLUCOSEU NEGATIVE 10/11/2023 1000   HGBUR NEGATIVE 10/11/2023 1000   BILIRUBINUR NEGATIVE 10/11/2023 1000   KETONESUR NEGATIVE 10/11/2023 1000   PROTEINUR NEGATIVE 10/11/2023 1000   UROBILINOGEN 0.2 05/12/2013 0210   NITRITE NEGATIVE 10/11/2023 1000   LEUKOCYTESUR TRACE (A) 10/11/2023 1000    Radiological Exams on Admission: MR BRAIN WO CONTRAST Result Date: 12/26/2023 EXAM: MRI BRAIN WITHOUT CONTRAST 12/26/2023 11:01:51 AM TECHNIQUE: Multiplanar multisequence MRI of the head/brain was performed without the administration of intravenous contrast. COMPARISON: CT head, CTA earlier today. Brain MRI 08/15/2017. CLINICAL HISTORY: 68 year old female with code stroke presentation and mental status change of unknown cause. FINDINGS: BRAIN AND VENTRICLES: No acute infarct. No acute intracranial hemorrhage. No mass. No midline shift. Chronic ventriculomegaly appears stable since 2019. Evans Index is 0.31. Callosal angle 117 degrees. No transependymal edema. No hyperdynamic flow at the cerebral aqueduct. Stable brain volume since 2019. Patchy chronic cerebral white matter T2 and FLAIR hyperintensity, more pronounced in the right hemisphere and especially the right  corona radiata, has not significantly changed. Small area of cystic appearing white matter encephalomalacia at the left temporal stem is stable. No cortical encephalomalacia identified. Possible small chronic microhemorrhage of the right external capsule (series 14, image 42). No other chronic cerebral blood products. Deep gray nuclei and brainstem appear negative. 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 The sella is unremarkable. Normal flow  voids. ORBITS: No acute abnormality. SINUSES AND MASTOIDS: No acute abnormality. Grossly normal visible internal auditory structures. BONES AND SOFT TISSUES: Normal marrow signal. No acute soft tissue abnormality. IMPRESSION: 1. No acute intracranial abnormality. 2. Chronic small vessel disease appears stable since 2019. 3. Stable chronic nonspecific ventriculomegaly (Evans index 0.31 and callosal angle 117 degrees). Electronically signed by: Helayne Hurst MD 12/26/2023 11:10 AM EST RP Workstation: [id number]   CT ANGIO HEAD NECK W WO CM Result Date: 12/26/2023 EXAM: CTA HEAD AND NECK WITH _study_datetime_ TECHNIQUE: CTA of the head and neck was performed with the administration of intravenous contrast. Multiplanar 2D and/or 3D reformatted images are provided for review. Automated exposure control, iterative reconstruction, and/or weight based adjustment of the mA/kV was utilized to reduce the radiation dose to as low as reasonably achievable. Stenosis of the internal carotid arteries measured using NASCET criteria. COMPARISON: MRA head 06/10/2017 CLINICAL HISTORY: ACUTE NEURO DEFICIT, STROKE SUSPECTED ; LEFT-SIDED WEAKNESS FINDINGS: CTA NECK: AORTIC ARCH AND ARCH VESSELS: No dissection or arterial injury. No significant stenosis of the brachiocephalic or subclavian arteries. CERVICAL CAROTID ARTERIES: No dissection, arterial injury, or hemodynamically significant stenosis by NASCET criteria. CERVICAL VERTEBRAL  ARTERIES: No dissection, arterial injury, or significant stenosis. LUNGS AND MEDIASTINUM: Bilateral pulmonary emphysema. SOFT TISSUES: No acute abnormality. BONES: No acute abnormality. CTA HEAD: ANTERIOR CIRCULATION: No significant stenosis of the internal carotid arteries. No significant stenosis of the anterior cerebral arteries. No significant stenosis of the middle cerebral arteries. No aneurysm. POSTERIOR CIRCULATION: No significant stenosis of the posterior cerebral arteries. No significant stenosis of the basilar artery. No significant stenosis of the vertebral arteries. No aneurysm. OTHER: No dural venous sinus thrombosis on this non-dedicated study. Degenerative changes noted. IMPRESSION: 1. No large vessel occlusion or hemodynamically significant stenosis. 2. These findings were communicated to Dr. Zammit by phone at 10:04 AM. Electronically signed by: Prentice Spade 12/26/2023 10:36 AM EST RP Workstation: [id number]   CT HEAD CODE STROKE WO CONTRAST (LKW 0-4.5h, LVO 0-24h) Result Date: 12/26/2023 EXAM: CT HEAD WITHOUT CONTRAST 12/26/2023 09:32:09 AM TECHNIQUE: CT of the head was performed without the administration of intravenous contrast. Automated exposure control, iterative reconstruction, and/or weight based adjustment of the mA/kV was utilized to reduce the radiation dose to as low as reasonably achievable. COMPARISON: CT head 04/11/2022. CLINICAL HISTORY: Neuro deficit, acute, stroke suspected. FINDINGS: BRAIN AND VENTRICLES: No acute hemorrhage. No evidence of acute infarct. No hydrocephalus. No extra-axial collection. No mass effect or midline shift. Similar appearance of encephalomalacia in the anterior right temporal lobe. Similar chronic microvascular ischemic changes. Mild parenchymal volume loss. Atherosclerosis of the carotid siphons. [place] Stroke Program Early CT (ASPECT) score: Ganglionic (caudate, internal capsule, lentiform nucleus, insula, M1-M3): 7 Supraganglionic (M4-M6): 3  Total: 10 ORBITS: No acute abnormality. SINUSES: No acute abnormality. SOFT TISSUES AND SKULL: No acute soft tissue abnormality. No skull fracture. IMPRESSION: 1. No acute intracranial abnormality. 2. ASPECTS score is 10. 3. Findings discussed with Dr. Zammit at 9:43 AM on 12/26/23. Electronically signed by: Donnice Mania MD 12/26/2023 09:44 AM EST RP Workstation: [id number]    EKG: Independently reviewed.  Normal sinus rhythm  Assessment/Plan   Left-sided weakness, concern for stroke - NIHSS score 2, left upper extremity flaccid on presentation but improving. -Previous history of CVA, ICH -CT head negative for bleeding - Brain MRI negative for acute stroke - CT angiogram negative for large vessel occlusion or significant stenosis - Loaded with aspirin  and Plavix.  Will initiate baby aspirin  and 75 mg daily Plavix  tomorrow for 3 weeks followed by aspirin  alone -Echo, hemoglobin A1c, TSH, lipid panel -Continue EEG. - PT/OT/ST evaluation  History of bipolar, hypothyroidism, anxiety - Resume home medications once reconciled  History of DVT: Not anticoagulated  DVT prophylaxis: Lovenox  Code Status: DNR Family Communication: Spoke with brother over the phone Disposition Plan: Pending PT evaluation Consults called: Neuro Admission status: Observation  Severity of Illness: The appropriate patient status for this patient is OBSERVATION. Observation status is judged to be reasonable and necessary in order to provide the required intensity of service to ensure the patient's safety. The patient's presenting symptoms, physical exam findings, and initial radiographic and laboratory data in the context of their medical condition is felt to place them at decreased risk for further clinical deterioration. Furthermore, it is anticipated that the patient will be medically stable for discharge from the hospital within 2 midnights of admission.     Derryl Duval MD [facility]  12/26/2023,  12:09 PM         [1] No Known Allergies

## 2023-12-26 NOTE — ED Notes (Signed)
CBG: 115 

## 2023-12-26 NOTE — ED Triage Notes (Signed)
 Pt here from home as a code stroke  last night lsn , pt presented with left sided facial droop and and left side weakness and slurred speech

## 2023-12-26 NOTE — ED Notes (Signed)
 Pt gone to MRI

## 2023-12-26 NOTE — Consult Note (Signed)
 [facility] Telemedicine Consult   Requesting Provider: Suzette Consult Participants: Nurse Location of the provider: [facility] Location of the patient: [facility] ED  This consult was provided via telemedicine with 2-way video and audio communication. The patient/family was informed that care would be provided in this way and agreed to receive care in this manner.    Chief Complaint: Left sided weakness  HPI: 68 year old female with a history of SIADH, hyperthyroidism, BPD, GIB, ICH (2021), CVA who presents with left sided weakness.  Patient has in home care.  Was noted by caregiver this AM to have left sided weakness.  Unclear LKW but greater than 4.5 hours.  Patient brought in EMS and code stroke initiated. Patient on ASA at home.  At baseline requires 24-hour care and is nonambulatory.  LKW: Unknown-greater than 4.5 hours, h/o ICH tnk given?: No, Outside time window IR Thrombectomy? No, High modified rankin, no target lesion identified Modified Rankin Scale: 5-Severe disability-bedridden, incontinent, needs constant attention Time of Page: 0901 Time of teleneurologist evaluation: 0905  Exam: Vitals:   12/26/23 0936  BP: (!) 174/87  Pulse: 88  Resp: (!) 22  Temp: 97.6 F (36.4 C)  SpO2: 95%    General: NAD  1A: Level of Consciousness - 0 1B: Ask Month and Age - 0 1C: 'Blink Eyes' & 'Squeeze Hands' - 0 2: Test Horizontal Extraocular Movements - 0 3: Test Visual Fields - 0 4: Test Facial Palsy - 0 5A: Test Left Arm Motor Drift - 1 5B: Test Right Arm Motor Drift - 0 6A: Test Left Leg Motor Drift - 0 6B: Test Right Leg Motor Drift - 0 7: Test Limb Ataxia - 0 8: Test Sensation - 0 9: Test Language/Aphasia- 0 10: Test Dysarthria - 1 11: Test Extinction/Inattention - 0 NIHSS score: 2   Imaging Reviewed:  CT HEAD WITHOUT CONTRAST 12/26/2023 09:32:09 AM   TECHNIQUE: CT of the head was performed without the administration of intravenous contrast. Automated exposure  control, iterative reconstruction, and/or weight based adjustment of the mA/kV was utilized to reduce the radiation dose to as low as reasonably achievable.   COMPARISON: CT head 04/11/2022.   CLINICAL HISTORY: Neuro deficit, acute, stroke suspected.   FINDINGS:   BRAIN AND VENTRICLES: No acute hemorrhage. No evidence of acute infarct. No hydrocephalus. No extra-axial collection. No mass effect or midline shift. Similar appearance of encephalomalacia in the anterior right temporal lobe. Similar chronic microvascular ischemic changes. Mild parenchymal volume loss. Atherosclerosis of the carotid siphons. [place] Stroke Program Early CT (ASPECT) score:   Ganglionic (caudate, internal capsule, lentiform nucleus, insula, M1-M3): 7   Supraganglionic (M4-M6): 3   Total: 10   ORBITS: No acute abnormality.   SINUSES: No acute abnormality.   SOFT TISSUES AND SKULL: No acute soft tissue abnormality. No skull fracture.   IMPRESSION: 1. No acute intracranial abnormality. 2. ASPECTS score is 10. 3. Findings discussed with Dr. Zammit at 9:43 AM on 12/26/23.  CTA HEAD AND NECK WITH _study_datetime_   TECHNIQUE: CTA of the head and neck was performed with the administration of intravenous contrast. Multiplanar 2D and/or 3D reformatted images are provided for review. Automated exposure control, iterative reconstruction, and/or weight based adjustment of the mA/kV was utilized to reduce the radiation dose to as low as reasonably achievable. Stenosis of the internal carotid arteries measured using NASCET criteria.   COMPARISON: MRA head 06/10/2017   CLINICAL HISTORY: ACUTE NEURO DEFICIT, STROKE SUSPECTED ; LEFT-SIDED WEAKNESS   FINDINGS:   CTA  NECK: AORTIC ARCH AND ARCH VESSELS: No dissection or arterial injury. No significant stenosis of the brachiocephalic or subclavian arteries.   CERVICAL CAROTID ARTERIES: No dissection, arterial injury, or hemodynamically significant  stenosis by NASCET criteria.   CERVICAL VERTEBRAL ARTERIES: No dissection, arterial injury, or significant stenosis.   LUNGS AND MEDIASTINUM: Bilateral pulmonary emphysema.   SOFT TISSUES: No acute abnormality.   BONES: No acute abnormality.   CTA HEAD: ANTERIOR CIRCULATION: No significant stenosis of the internal carotid arteries. No significant stenosis of the anterior cerebral arteries. No significant stenosis of the middle cerebral arteries. No aneurysm.   POSTERIOR CIRCULATION: No significant stenosis of the posterior cerebral arteries. No significant stenosis of the basilar artery. No significant stenosis of the vertebral arteries. No aneurysm.   OTHER: No dural venous sinus thrombosis on this non-dedicated study. Degenerative changes noted.   IMPRESSION: 1. No large vessel occlusion or hemodynamically significant stenosis. 2. These findings were communicated to Dr. Zammit by phone at 10:04 AM.    Labs reviewed in epic and pertinent values follow: CBG 115   Assessment: 68 year old female with a history of SIADH, hyperthyroidism, BPD, GIB, ICH (2021), CVA who presents with left sided weakness.  Patient has in home care.  Was noted by caregiver this AM to have left sided weakness.  Unclear LKW but greater than 4.5 hours.  Patient brought in EMS and code stroke initiated.  On presentation left side was flaccid.  Patient improved significantly while in the ED and by NIHSS evaluation, score was a 2.  Head CT personally reviewed and shows no acute changes.  CTA of the head and neck personally reviewed and shows no evidence of LVO.  TIA/acute infarct/seizure all on the differential.   Further work up recommended  Recommendations:  1. HgbA1c, fasting lipid panel.  Goal LDL<70, goal A1c<7.0 2. MRI of the brain without contrast 3. PT consult, OT consult, Speech consult 4. Echocardiogram with bubble study 5. Prophylactic therapy-ASA 325mg  po and Plavix 300mg  po now.  On  tomorrow would initiate dual antiplatelet therapy with ASA 81mg  and Plavix 75 mg daily.  To continue for three weeks then discontinue Plavix.   7. NPO until RN stroke swallow screen 8. Telemetry monitoring 9. Frequent neuro checks 10. EEG   This patient is receiving care for possible acute neurological changes. Care by this provider at the time of service included time for direct evaluation via telemedicine, review of medical records, imaging studies and discussion of findings with providers, the patient and/or family.  Case discussed with Dr. Suzette Sonny Hock, MD Neurology   If 8pm- 8am, please page neurology on call as listed in AMION.

## 2023-12-26 NOTE — ED Notes (Signed)
 Pt requested something to drink. Explained that she wasn't allowed anything until SLP had cleared her. Mouth care provided. Pt appreciative.

## 2023-12-26 NOTE — ED Notes (Signed)
CODE STROKE activated at this time.

## 2023-12-27 DIAGNOSIS — G459 Transient cerebral ischemic attack, unspecified: Principal | ICD-10-CM | POA: Diagnosis present

## 2023-12-27 LAB — POCT I-STAT, CHEM 8
BUN: 24 mg/dL — ABNORMAL HIGH (ref 8–23)
Calcium, Ion: 1.17 mmol/L (ref 1.15–1.40)
Chloride: 104 mmol/L (ref 98–111)
Creatinine, Ser: 0.8 mg/dL (ref 0.44–1.00)
Glucose, Bld: 146 mg/dL — ABNORMAL HIGH (ref 70–99)
HCT: 46 % (ref 36.0–46.0)
Hemoglobin: 15.6 g/dL — ABNORMAL HIGH (ref 12.0–15.0)
Potassium: 4.1 mmol/L (ref 3.5–5.1)
Sodium: 138 mmol/L (ref 135–145)
TCO2: 26 mmol/L (ref 22–32)

## 2023-12-27 LAB — LIPID PANEL
Cholesterol: 197 mg/dL (ref 0–200)
HDL: 94 mg/dL (ref >40)
LDL Cholesterol: 80 mg/dL (ref 0–99)
Total CHOL/HDL Ratio: 2.1 ratio
Triglycerides: 114 mg/dL (ref <150)
VLDL: 23 mg/dL (ref 0–40)

## 2023-12-27 LAB — GLUCOSE, CAPILLARY
Glucose-Capillary: 60 mg/dL — ABNORMAL LOW (ref 70–99)
Glucose-Capillary: 95 mg/dL (ref 70–99)
Glucose-Capillary: 84 mg/dL (ref 70–99)

## 2023-12-27 LAB — HEMOGLOBIN A1C
Hgb A1c MFr Bld: 5.3 % (ref 4.8–5.6)
Mean Plasma Glucose: 105.41 mg/dL

## 2023-12-27 MED ORDER — CLOPIDOGREL BISULFATE 75 MG PO TABS: 75.0000 mg | ORAL_TABLET | Freq: Every day | ORAL | 0 refills | Status: AC

## 2023-12-27 MED ORDER — METHIMAZOLE 10 MG PO TABS: 10.0000 mg | ORAL_TABLET | Freq: Every day | ORAL | 1 refills | Status: AC

## 2023-12-27 NOTE — ED Provider Notes (Signed)
 [facility] MEDICAL SURGICAL UNIT Provider Note   CSN: [id number] Arrival date & time: 12/26/23  9093     Patient presents with: Code Stroke   Cassidy Smith is a 67 y.o. female.   Patient has a history of SIADH.  And a previous stroke she presents with some slurred speech and left-sided weakness.  It has been going on for at least 12 hours.  Patient was seen by neurology for possible interventional treatment.   Weakness Severity:  Moderate Onset quality:  Sudden Timing:  Constant Progression:  Worsening Chronicity:  New Context: not alcohol use and not allergies   Relieved by:  Nothing Worsened by:  Nothing Ineffective treatments:  None tried Associated symptoms: no abdominal pain, no chest pain, no cough, no diarrhea, no frequency, no headaches and no seizures        Prior to Admission medications  Medication Sig Start Date End Date Taking? Authorizing Provider  atorvastatin  (LIPITOR) 20 MG tablet Take 1 tablet (20 mg total) by mouth daily at 6 PM. 06/11/17  Yes Ricky Fines, MD  benztropine  (COGENTIN ) 1 MG tablet Take 1 tablet (1 mg total) by mouth 2 (two) times daily. 01/05/18  Yes Johnson, Clanford L, MD  divalproex  (DEPAKOTE  ER) 500 MG 24 hr tablet Take 2 tablets (1,000 mg total) by mouth at bedtime. 08/16/17  Yes Tat, Alm, MD  furosemide  (LASIX ) 20 MG tablet Take 20 mg by mouth daily.   Yes [provider]  gabapentin  (NEURONTIN ) 100 MG capsule Take 1 capsule (100 mg total) by mouth 3 (three) times daily. 05/13/17  Yes Margrette Taft BRAVO, MD  INVEGA SUSTENNA 234 MG/1.5ML injection Inject into the muscle. 11/17/23  Yes [provider]  methimazole  (TAPAZOLE ) 10 MG tablet Take 10 mg by mouth every other day. 07/06/20  Yes [provider]  pantoprazole  (PROTONIX ) 40 MG tablet Take 40 mg by mouth daily.   Yes [provider]  potassium chloride  (KLOR-CON ) 10 MEQ tablet Take 10 mEq by mouth daily. 01/16/22  Yes [provider]   silver  sulfADIAZINE  (SILVADENE ) 1 % cream Apply 1 Application topically daily. 11/13/22  Yes Valdemar Rocky SAUNDERS, NP  traZODone  (DESYREL ) 100 MG tablet Take 100 mg by mouth at bedtime.   Yes [provider]  ziprasidone  (GEODON ) 20 MG capsule Take 20 mg by mouth 2 (two) times daily. 12/12/23  Yes [provider]  acetaminophen -codeine  (TYLENOL  #3) 300-30 MG tablet Take 1 tablet by mouth every 6 (six) hours as needed for moderate pain. 04/10/22   Valdemar Rocky SAUNDERS, NP  aspirin  EC 81 MG tablet Take 1 tablet (81 mg total) by mouth daily with breakfast. Swallow whole. 08/14/20   Pearlean Manus, MD  Cholecalciferol  (VITAMIN D3) 50 MCG (2000 UT) capsule Take 2,000 Units by mouth daily.    [provider]  Cyanocobalamin  (B-12) 5000 MCG CAPS Take 5,000 mcg by mouth daily.    [provider]  ipratropium-albuterol  (DUONEB) 0.5-2.5 (3) MG/3ML SOLN Take 3 mLs by nebulization every 6 (six) hours as needed. 02/24/22   Evonnie Alm, MD  Menthol -Zinc  Oxide (CALMOSEPTINE) 0.44-20.6 % OINT Apply 1 Application topically daily as needed (bed sores).    [provider]  Multiple Vitamin (MULTIVITAMIN WITH MINERALS) TABS tablet Take 1 tablet by mouth daily. Centrum Silver     [provider]    Allergies: Patient has no known allergies.    Review of Systems  Constitutional:  Negative for appetite change and fatigue.  HENT:  Negative for  congestion, ear discharge and sinus pressure.   Eyes:  Negative for discharge.  Respiratory:  Negative for cough.   Cardiovascular:  Negative for chest pain.  Gastrointestinal:  Negative for abdominal pain and diarrhea.  Genitourinary:  Negative for frequency and hematuria.  Musculoskeletal:  Negative for back pain.  Skin:  Negative for rash.  Neurological:  Positive for weakness. Negative for seizures and headaches.  Psychiatric/Behavioral:  Negative for hallucinations.     Updated Vital Signs BP (!) 164/110 (BP Location: Left Arm)    Pulse 94   Temp 98.3 F (36.8 C) (Oral)   Resp 18   Ht 5' 6 (1.676 m)   Wt 48.6 kg   SpO2 95%   BMI 17.29 kg/m   Physical Exam Vitals reviewed.  Constitutional:      Appearance: She is well-developed.  HENT:     Head: Normocephalic.     Nose: Nose normal.  Eyes:     General: No scleral icterus.    Conjunctiva/sclera: Conjunctivae normal.  Neck:     Thyroid : No thyromegaly.  Cardiovascular:     Rate and Rhythm: Normal rate and regular rhythm.     Heart sounds: No murmur heard.    No friction rub. No gallop.  Pulmonary:     Breath sounds: No stridor. No wheezing or rales.  Chest:     Chest wall: No tenderness.  Abdominal:     General: There is no distension.     Tenderness: There is no abdominal tenderness. There is no rebound.  Musculoskeletal:        General: Normal range of motion.     Cervical back: Neck supple.  Lymphadenopathy:     Cervical: No cervical adenopathy.  Skin:    Findings: No erythema or rash.  Neurological:     Mental Status: She is alert and oriented to person, place, and time.     Motor: No abnormal muscle tone.     Coordination: Coordination normal.     Comments: Weakness in left arm  Psychiatric:        Behavior: Behavior normal.     (all labs ordered are listed, but only abnormal results are displayed) Labs Reviewed  CBC - Abnormal; Notable for the following components:      Result Value   WBC 11.4 (*)    All other components within normal limits  DIFFERENTIAL - Abnormal; Notable for the following components:   Neutro Abs 9.1 (*)    All other components within normal limits  COMPREHENSIVE METABOLIC PANEL WITH GFR - Abnormal; Notable for the following components:   Glucose, Bld 144 (*)    All other components within normal limits  TSH - Abnormal; Notable for the following components:   TSH 0.317 (*)    All other components within normal limits  GLUCOSE, CAPILLARY - Abnormal; Notable for the following components:    Glucose-Capillary 60 (*)    All other components within normal limits  CBG MONITORING, ED - Abnormal; Notable for the following components:   Glucose-Capillary 115 (*)    All other components within normal limits  PROTIME-INR  APTT  ETHANOL  URINE DRUG SCREEN  HIV ANTIBODY (ROUTINE TESTING W REFLEX)  HEMOGLOBIN A1C  LIPID PANEL  GLUCOSE, CAPILLARY  GLUCOSE, CAPILLARY  I-STAT CHEM 8, ED  CBG MONITORING, ED    EKG: None  Radiology: ECHOCARDIOGRAM COMPLETE Result Date: 12/26/2023    ECHOCARDIOGRAM REPORT   Patient Name:   Cassidy Smith Date of Exam: 12/26/2023  Medical Rec #:  [id number]       Height:       66.0 in Accession #:    [id number]      Weight:       119.0 lb Date of Birth:  1955/12/23       BSA:          1.604 m Patient Age:    68 years        BP:           155/73 mmHg Patient Gender: F               HR:           79 bpm. Exam Location:  Inpatient Procedure: 2D Echo, Cardiac Doppler and Color Doppler (Both Spectral and Color            Flow Doppler were utilized during procedure). Indications:    TIA G45.9  History:        Patient has prior history of Echocardiogram examinations, most                 recent 02/19/2022. Stroke.  Sonographer:    Jayson Gaskins Referring Phys: [id number] SANTOSH SIGDEL IMPRESSIONS  1. Left ventricular ejection fraction, by estimation, is 70 to 75%. The left ventricle has hyperdynamic function. The left ventricle has no regional wall motion abnormalities. Left ventricular diastolic parameters are consistent with Grade I diastolic dysfunction (impaired relaxation).  2. Right ventricular systolic function is normal. The right ventricular size is normal. Tricuspid regurgitation signal is inadequate for assessing PA pressure.  3. The mitral valve is normal in structure. No evidence of mitral valve regurgitation. No evidence of mitral stenosis.  4. The aortic valve was not well visualized. Aortic valve regurgitation is not visualized. No aortic stenosis is present.   5. The inferior vena cava is normal in size with greater than 50% respiratory variability, suggesting right atrial pressure of 3 mmHg. FINDINGS  Left Ventricle: Left ventricular ejection fraction, by estimation, is 70 to 75%. The left ventricle has hyperdynamic function. The left ventricle has no regional wall motion abnormalities. The left ventricular internal cavity size was normal in size. There is no left ventricular hypertrophy. Left ventricular diastolic parameters are consistent with Grade I diastolic dysfunction (impaired relaxation). Normal left ventricular filling pressure. Right Ventricle: The right ventricular size is normal. Right vetricular wall thickness was not well visualized. Right ventricular systolic function is normal. Tricuspid regurgitation signal is inadequate for assessing PA pressure. Left Atrium: Left atrial size was normal in size. Right Atrium: Right atrial size was normal in size. Pericardium: There is no evidence of pericardial effusion. Mitral Valve: The mitral valve is normal in structure. No evidence of mitral valve regurgitation. No evidence of mitral valve stenosis. Tricuspid Valve: The tricuspid valve is not well visualized. Tricuspid valve regurgitation is not demonstrated. No evidence of tricuspid stenosis. Aortic Valve: The aortic valve was not well visualized. Aortic valve regurgitation is not visualized. No aortic stenosis is present. Aortic valve mean gradient measures 3.0 mmHg. Aortic valve peak gradient measures 4.2 mmHg. Aortic valve area, by VTI measures 2.88 cm. Pulmonic Valve: The pulmonic valve was not well visualized. Pulmonic valve regurgitation is not visualized. No evidence of pulmonic stenosis. Aorta: The aortic root is normal in size and structure. Venous: The inferior vena cava is normal in size with greater than 50% respiratory variability, suggesting right atrial pressure of 3 mmHg. IAS/Shunts: No atrial level shunt detected by color flow Doppler.  LEFT  VENTRICLE PLAX 2D LVIDd:         3.82 cm   Diastology LVIDs:         2.73 cm   LV e' medial:    4.30 cm/s LV PW:         0.67 cm   LV E/e' medial:  18.0 LV IVS:        0.72 cm   LV e' lateral:   5.42 cm/s LVOT diam:     1.90 cm   LV E/e' lateral: 14.3 LV SV:         53 LV SV Index:   33 LVOT Area:     2.84 cm  LEFT ATRIUM           Index        RIGHT ATRIUM           Index LA Vol (A2C): 30.4 ml 18.95 ml/m  RA Area:     16.70 cm LA Vol (A4C): 19.2 ml 11.97 ml/m  RA Volume:   50.10 ml  31.23 ml/m  AORTIC VALVE AV Area (Vmax):    2.65 cm AV Area (Vmean):   2.92 cm AV Area (VTI):     2.88 cm AV Vmax:           103.00 cm/s AV Vmean:          78.500 cm/s AV VTI:            0.185 m AV Peak Grad:      4.2 mmHg AV Mean Grad:      3.0 mmHg LVOT Vmax:         96.10 cm/s LVOT Vmean:        80.900 cm/s LVOT VTI:          0.188 m LVOT/AV VTI ratio: 1.02  AORTA Ao Root diam: 3.00 cm MITRAL VALVE MV Area (PHT): 3.45 cm    SHUNTS MV Decel Time: 220 msec    Systemic VTI:  0.19 m MV E velocity: 77.60 cm/s  Systemic Diam: 1.90 cm MV A velocity: 87.80 cm/s MV E/A ratio:  0.88 Dorn Ross MD Electronically signed by Dorn Ross MD Signature Date/Time: 12/26/2023/3:43:05 PM    Final    MR BRAIN WO CONTRAST Result Date: 12/26/2023 EXAM: MRI BRAIN WITHOUT CONTRAST 12/26/2023 11:01:51 AM TECHNIQUE: Multiplanar multisequence MRI of the head/brain was performed without the administration of intravenous contrast. COMPARISON: CT head, CTA earlier today. Brain MRI 08/15/2017. CLINICAL HISTORY: 68 year old female with code stroke presentation and mental status change of unknown cause. FINDINGS: BRAIN AND VENTRICLES: No acute infarct. No acute intracranial hemorrhage. No mass. No midline shift. Chronic ventriculomegaly appears stable since 2019. Evans Index is 0.31. Callosal angle 117 degrees. No transependymal edema. No hyperdynamic flow at the cerebral aqueduct. Stable brain volume since 2019. Patchy chronic cerebral white  matter T2 and FLAIR hyperintensity, more pronounced in the right hemisphere and especially the right corona radiata, has not significantly changed. Small area of cystic appearing white matter encephalomalacia at the left temporal stem is stable. No cortical encephalomalacia identified. Possible small chronic microhemorrhage of the right external capsule (series 14, image 42). No other chronic cerebral blood products. Deep gray nuclei and brainstem appear negative. 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 The sella is unremarkable. Normal flow voids. ORBITS: No acute abnormality. SINUSES AND MASTOIDS: No acute abnormality. Grossly normal visible internal auditory structures. BONES AND SOFT TISSUES: Normal marrow  signal. No acute soft tissue abnormality. IMPRESSION: 1. No acute intracranial abnormality. 2. Chronic small vessel disease appears stable since 2019. 3. Stable chronic nonspecific ventriculomegaly (Evans index 0.31 and callosal angle 117 degrees). Electronically signed by: Helayne Hurst MD 12/26/2023 11:10 AM EST RP Workstation: [id number]   CT ANGIO HEAD NECK W WO CM Result Date: 12/26/2023 EXAM: CTA HEAD AND NECK WITH _study_datetime_ TECHNIQUE: CTA of the head and neck was performed with the administration of intravenous contrast. Multiplanar 2D and/or 3D reformatted images are provided for review. Automated exposure control, iterative reconstruction, and/or weight based adjustment of the mA/kV was utilized to reduce the radiation dose to as low as reasonably achievable. Stenosis of the internal carotid arteries measured using NASCET criteria. COMPARISON: MRA head 06/10/2017 CLINICAL HISTORY: ACUTE NEURO DEFICIT, STROKE SUSPECTED ; LEFT-SIDED WEAKNESS FINDINGS: CTA NECK: AORTIC ARCH AND ARCH VESSELS: No dissection or arterial injury. No significant stenosis of the brachiocephalic or subclavian arteries. CERVICAL CAROTID ARTERIES: No dissection,  arterial injury, or hemodynamically significant stenosis by NASCET criteria. CERVICAL VERTEBRAL ARTERIES: No dissection, arterial injury, or significant stenosis. LUNGS AND MEDIASTINUM: Bilateral pulmonary emphysema. SOFT TISSUES: No acute abnormality. BONES: No acute abnormality. CTA HEAD: ANTERIOR CIRCULATION: No significant stenosis of the internal carotid arteries. No significant stenosis of the anterior cerebral arteries. No significant stenosis of the middle cerebral arteries. No aneurysm. POSTERIOR CIRCULATION: No significant stenosis of the posterior cerebral arteries. No significant stenosis of the basilar artery. No significant stenosis of the vertebral arteries. No aneurysm. OTHER: No dural venous sinus thrombosis on this non-dedicated study. Degenerative changes noted. IMPRESSION: 1. No large vessel occlusion or hemodynamically significant stenosis. 2. These findings were communicated to Dr. Zaelynn Fuchs by phone at 10:04 AM. Electronically signed by: Prentice Spade 12/26/2023 10:36 AM EST RP Workstation: [id number]   CT HEAD CODE STROKE WO CONTRAST (LKW 0-4.5h, LVO 0-24h) Result Date: 12/26/2023 EXAM: CT HEAD WITHOUT CONTRAST 12/26/2023 09:32:09 AM TECHNIQUE: CT of the head was performed without the administration of intravenous contrast. Automated exposure control, iterative reconstruction, and/or weight based adjustment of the mA/kV was utilized to reduce the radiation dose to as low as reasonably achievable. COMPARISON: CT head 04/11/2022. CLINICAL HISTORY: Neuro deficit, acute, stroke suspected. FINDINGS: BRAIN AND VENTRICLES: No acute hemorrhage. No evidence of acute infarct. No hydrocephalus. No extra-axial collection. No mass effect or midline shift. Similar appearance of encephalomalacia in the anterior right temporal lobe. Similar chronic microvascular ischemic changes. Mild parenchymal volume loss. Atherosclerosis of the carotid siphons. [place] Stroke Program Early CT (ASPECT) score: Ganglionic  (caudate, internal capsule, lentiform nucleus, insula, M1-M3): 7 Supraganglionic (M4-M6): 3 Total: 10 ORBITS: No acute abnormality. SINUSES: No acute abnormality. SOFT TISSUES AND SKULL: No acute soft tissue abnormality. No skull fracture. IMPRESSION: 1. No acute intracranial abnormality. 2. ASPECTS score is 10. 3. Findings discussed with Dr. Lashawnta Burgert at 9:43 AM on 12/26/23. Electronically signed by: Donnice Mania MD 12/26/2023 09:44 AM EST RP Workstation: [id number]     Procedures   Medications Ordered in the ED   stroke: early stages of recovery book (has no administration in time range)  0.9 %  sodium chloride  infusion ( Intravenous Infusion Verify 12/27/23 0618)  acetaminophen  (TYLENOL ) tablet 650 mg (has no administration in time range)    Or  acetaminophen  (TYLENOL ) 160 MG/5ML solution 650 mg (has no administration in time range)    Or  acetaminophen  (TYLENOL ) suppository 650 mg (has no administration in time range)  senna-docusate (Senokot-S) tablet 1 tablet (has no administration in time range)  enoxaparin  (LOVENOX ) injection 40 mg (40 mg Subcutaneous Given 12/26/23 1351)  ipratropium-albuterol  (DUONEB) 0.5-2.5 (3) MG/3ML nebulizer solution 3 mL (has no administration in time range)  atorvastatin  (LIPITOR) tablet 20 mg (20 mg Oral Not Given 12/26/23 1747)  benztropine  (COGENTIN ) tablet 1 mg (0 mg Oral Hold 12/26/23 2149)  cholecalciferol  (VITAMIN D3) 25 MCG (1000 UNIT) tablet 2,000 Units (2,000 Units Oral Not Given 12/26/23 1748)  divalproex  (DEPAKOTE  ER) 24 hr tablet 1,000 mg (1,000 mg Oral Not Given 12/26/23 2149)  furosemide  (LASIX ) tablet 20 mg (20 mg Oral Not Given 12/26/23 1747)  gabapentin  (NEURONTIN ) capsule 100 mg (100 mg Oral Not Given 12/26/23 2150)  methimazole  (TAPAZOLE ) tablet 10 mg (10 mg Oral Not Given 12/26/23 1724)  pantoprazole  (PROTONIX ) EC tablet 40 mg (40 mg Oral Not Given 12/26/23 1724)  ziprasidone  (GEODON ) capsule 20 mg (20 mg Oral Not Given 12/26/23 1725)   cyanocobalamin  (VITAMIN B12) tablet 1,000 mcg (has no administration in time range)  iohexol  (OMNIPAQUE ) 350 MG/ML injection 75 mL (75 mLs Intravenous Contrast Given 12/26/23 0928)  aspirin  EC tablet 325 mg (325 mg Oral Given 12/26/23 1000)  clopidogrel  (PLAVIX ) tablet 300 mg (300 mg Oral Given 12/26/23 1000)     CRITICAL CARE Performed by: Fairy Sermon Total critical care time: 45 minutes Critical care time was exclusive of separately billable procedures and treating other patients. Critical care was necessary to treat or prevent imminent or life-threatening deterioration. Critical care was time spent personally by me on the following activities: development of treatment plan with patient and/or surrogate as well as nursing, discussions with consultants, evaluation of patient's response to treatment, examination of patient, obtaining history from patient or surrogate, ordering and performing treatments and interventions, ordering and review of laboratory studies, ordering and review of radiographic studies, pulse oximetry and re-evaluation of patient's condition.   Patient is being admitted to medicine.  Patient symptoms seem to be improving                               Medical Decision Making Amount and/or Complexity of Data Reviewed Labs: ordered. Radiology: ordered.  Risk OTC drugs. Prescription drug management. Decision regarding hospitalization.  Patient with weakness in the left arm that is improving.  Possible TIA.  She will be admitted to medicine and neurology has seen her for stroke workup     Final diagnoses:  TIA (transient ischemic attack)    ED Discharge Orders     None          Sermon Fairy, MD 12/27/23 [phone]

## 2023-12-27 NOTE — Plan of Care (Signed)
 Problem: Education: Goal: Knowledge of disease or condition will improve 12/27/2023 1449 by Delores Kirsch, RN Outcome: Adequate for Discharge 12/27/2023 1008 by Delores Kirsch, RN Outcome: Progressing Goal: Knowledge of secondary prevention will improve (MUST DOCUMENT ALL) 12/27/2023 1449 by Delores Kirsch, RN Outcome: Adequate for Discharge 12/27/2023 1008 by Delores Kirsch, RN Outcome: Progressing Goal: Knowledge of patient specific risk factors will improve (DELETE if not current risk factor) 12/27/2023 1449 by Delores Kirsch, RN Outcome: Adequate for Discharge 12/27/2023 1008 by Delores Kirsch, RN Outcome: Progressing   Problem: Ischemic Stroke/TIA Tissue Perfusion: Goal: Complications of ischemic stroke/TIA will be minimized 12/27/2023 1449 by Delores Kirsch, RN Outcome: Adequate for Discharge 12/27/2023 1008 by Delores Kirsch, RN Outcome: Progressing   Problem: Coping: Goal: Will verbalize positive feelings about self 12/27/2023 1449 by Delores Kirsch, RN Outcome: Adequate for Discharge 12/27/2023 1008 by Delores Kirsch, RN Outcome: Progressing Goal: Will identify appropriate support needs 12/27/2023 1449 by Delores Kirsch, RN Outcome: Adequate for Discharge 12/27/2023 1008 by Delores Kirsch, RN Outcome: Progressing   Problem: Health Behavior/Discharge Planning: Goal: Ability to manage health-related needs will improve 12/27/2023 1449 by Delores Kirsch, RN Outcome: Adequate for Discharge 12/27/2023 1008 by Delores Kirsch, RN Outcome: Progressing Goal: Goals will be collaboratively established with patient/family 12/27/2023 1449 by Delores Kirsch, RN Outcome: Adequate for Discharge 12/27/2023 1008 by Delores Kirsch, RN Outcome: Progressing   Problem: Self-Care: Goal: Ability to participate in self-care as condition permits will improve 12/27/2023 1449 by Delores Kirsch, RN Outcome: Adequate for Discharge 12/27/2023 1008 by Delores Kirsch, RN Outcome:  Progressing Goal: Verbalization of feelings and concerns over difficulty with self-care will improve 12/27/2023 1449 by Delores Kirsch, RN Outcome: Adequate for Discharge 12/27/2023 1008 by Delores Kirsch, RN Outcome: Progressing Goal: Ability to communicate needs accurately will improve 12/27/2023 1449 by Delores Kirsch, RN Outcome: Adequate for Discharge 12/27/2023 1008 by Delores Kirsch, RN Outcome: Progressing   Problem: Nutrition: Goal: Risk of aspiration will decrease 12/27/2023 1449 by Delores Kirsch, RN Outcome: Adequate for Discharge 12/27/2023 1008 by Delores Kirsch, RN Outcome: Progressing Goal: Dietary intake will improve 12/27/2023 1449 by Delores Kirsch, RN Outcome: Adequate for Discharge 12/27/2023 1008 by Delores Kirsch, RN Outcome: Progressing   Problem: Education: Goal: Knowledge of General Education information will improve Description: Including pain rating scale, medication(s)/side effects and non-pharmacologic comfort measures 12/27/2023 1449 by Delores Kirsch, RN Outcome: Adequate for Discharge 12/27/2023 1008 by Delores Kirsch, RN Outcome: Progressing   Problem: Health Behavior/Discharge Planning: Goal: Ability to manage health-related needs will improve 12/27/2023 1449 by Delores Kirsch, RN Outcome: Adequate for Discharge 12/27/2023 1008 by Delores Kirsch, RN Outcome: Progressing   Problem: Clinical Measurements: Goal: Ability to maintain clinical measurements within normal limits will improve Outcome: Adequate for Discharge Goal: Will remain free from infection Outcome: Adequate for Discharge Goal: Diagnostic test results will improve Outcome: Adequate for Discharge Goal: Respiratory complications will improve Outcome: Adequate for Discharge Goal: Cardiovascular complication will be avoided Outcome: Adequate for Discharge   Problem: Activity: Goal: Risk for activity intolerance will decrease Outcome: Adequate for Discharge   Problem:  Nutrition: Goal: Adequate nutrition will be maintained Outcome: Adequate for Discharge   Problem: Coping: Goal: Level of anxiety will decrease Outcome: Adequate for Discharge   Problem: Elimination: Goal: Will not experience complications related to bowel motility Outcome: Adequate for Discharge Goal: Will not experience complications related to urinary retention Outcome: Adequate for Discharge   Problem: Pain Managment: Goal: General experience of comfort will improve and/or be controlled Outcome: Adequate for Discharge  Problem: Safety: Goal: Ability to remain free from injury will improve Outcome: Adequate for Discharge   Problem: Skin Integrity: Goal: Risk for impaired skin integrity will decrease Outcome: Adequate for Discharge

## 2023-12-27 NOTE — Plan of Care (Signed)
°  Problem: Acute Rehab PT Goals(only PT should resolve) Goal: Pt Will Go Supine/Side To Sit Outcome: Progressing Flowsheets (Taken 12/27/2023 0913) Pt will go Supine/Side to Sit: Independently Goal: Patient Will Transfer Sit To/From Stand Outcome: Progressing Flowsheets (Taken 12/27/2023 0913) Patient will transfer sit to/from stand: with contact guard assist Goal: Pt Will Transfer Bed To Chair/Chair To Bed Outcome: Progressing Flowsheets (Taken 12/27/2023 0913) Pt will Transfer Bed to Chair/Chair to Bed: with contact guard assist Goal: Pt Will Ambulate Outcome: Progressing Flowsheets (Taken 12/27/2023 0913) Pt will Ambulate:  25 feet  with minimal assist  Lang Ada, PT, DPT [facility] Office: [phone] 9:13 AM, 12/27/2023

## 2023-12-27 NOTE — Care Management Obs Status (Signed)
 MEDICARE OBSERVATION STATUS NOTIFICATION   Patient Details  Name: Cassidy Smith MRN: [id number] Date of Birth: 01-03-1956   Medicare Observation Status Notification Given:  Yes    Noreen KATHEE Pinal, LCSWA 12/27/2023, 12:00 PM

## 2023-12-27 NOTE — Discharge Summary (Signed)
 Physician Discharge Summary  Cassidy Smith:[id number] DOB: 1955/03/21 DOA: 12/26/2023  PCP: Orpha Yancey LABOR, MD  Admit date: 12/26/2023 Discharge date: 12/27/2023  Admitted From: Home Disposition: Home with [facility]  Recommendations for Outpatient Follow-up:  Follow up with PCP in 1 week  Methimazole  dose increased to 10 mg daily from 10 mg every other day due to suppressed TSH.  Recommend repeat thyroid  function test in 3 to 4 weeks. Follow-up with neurology.  Dual antiplatelet therapy with aspirin  and Plavix  for 3 weeks followed by aspirin  alone. Follow up in ED if symptoms worsen or new appear   Home Health: No Equipment/Devices: None  Discharge Condition: Stable CODE STATUS: DNR-Limited intervention Diet recommendation: Heart healthy  Brief/Interim Summary:   [name] is a 68 y.o. female with medical history significant of ICH 2021, CVA, SIADH, hypothyroidism who was brought by EMS due to concern for left-sided weakness and slurred speech.  History obtained from ER information as well as from her brother over the phone. He said that the patient was apparently okay last night but sometime in the morning her aide found her altered.  She was not speaking normally.  Also she was having some left-sided weakness. EMS was activated.   Her vital signs were stable in the ER.  Thrombolytics were not given as she was out of window.  CTA head and neck negative for large vessel occlusion or significant stenosis.  No bleeding.  Subsequent MRI is also negative for acute stroke or other acute findings.  Patient admitted for further workup.   Symptoms improved gradually and patient returned to baseline.  Has some left upper extremity weakness.  Assessment and plan: Left-sided weakness, stroke ruled out. - Evaluated by teleneurology in the ER, NIHSS score 2, left upper extremity flaccid on presentation but improving. -Previous history of CVA, ICH -CT head negative for bleeding - Brain  MRI negative for acute stroke - CT angiogram negative for large vessel occlusion or significant stenosis - Loaded with aspirin  and Plavix .  Will initiate baby aspirin  and 75 mg daily Plavix  tomorrow  3 weeks followed by aspirin  alone -Echo without PFO or thrombus -hemoglobin A1c 5.3, lipid panel with total cholesterol 197, HDL 94, LDL 80, triglyceride 114.  Continue atorvastatin  20 - Neurology referral for follow-up  Hyperthyroidism: On methimazole  10 mg every other day TSH is suppressed at 0.31.  Will increase methimazole  to 10 mg every day and follow-up with PCP.  Repeat TFT in 3 to 4 weeks - - PT/OT/ST evaluation-evaluate for SNF however patient/family would like to go home. Will set up home health/has aide   History of bipolar, hypothyroidism, anxiety - Continue home medications  History of DVT: Not anticoagulated PTA  Discharge Diagnoses:  Principal Problem:   TIA (transient ischemic attack)    Discharge Instructions  Discharge Instructions     Ambulatory referral to Neurology   Complete by: As directed    An appointment is requested in approximately: 2 weeks   Call MD for:  persistant dizziness or light-headedness   Complete by: As directed    Call MD for:  persistant nausea and vomiting   Complete by: As directed    Diet - low sodium heart healthy   Complete by: As directed    Discharge instructions   Complete by: As directed    1.  Take Plavix  75 mg daily for 21 days.  Continue aspirin . 2.  Due to suppressed TSH, we will increase the dose of methimazole  to 10  mg every day from every other day.  Please follow-up with your primary care provider.  We recommend repeat TSH in 4 to 6 weeks. 3.  Will place referral for neurology follow-up.   Face-to-face encounter (required for Medicare/Medicaid patients)   Complete by: As directed    I Sanvika Cuttino certify that this patient is under my care and that I, or a nurse practitioner or physician's assistant working with me,  had a face-to-face encounter that meets the physician face-to-face encounter requirements with this patient on 12/27/2023. The encounter with the patient was in whole, or in part for the following medical condition(s) which is the primary reason for home health care (List medical condition): TIA/history of strokes/left-sided weakness   The encounter with the patient was in whole, or in part, for the following medical condition, which is the primary reason for home health care: History of stroke/TIAs   I certify that, based on my findings, the following services are medically necessary home health services:  Physical therapy Nursing     Reason for Medically Necessary Home Health Services:  Skilled Nursing- Skilled Assessment/Observation Therapy- Instruction on use of Assistive Device for Ambulation on all Surfaces     My clinical findings support the need for the above services: Unsafe ambulation due to balance issues   Further, I certify that my clinical findings support that this patient is homebound due to: Unsafe ambulation due to balance issues   Home Health   Complete by: As directed    To provide the following care/treatments:  PT RN     Increase activity slowly   Complete by: As directed       Allergies as of 12/27/2023   No Known Allergies      Medication List     TAKE these medications    benztropine  1 MG tablet Commonly known as: COGENTIN  Take 1 tablet (1 mg total) by mouth 2 (two) times daily. The timing of this medication is very important.   acetaminophen -codeine  300-30 MG tablet Commonly known as: TYLENOL  #3 Take 1 tablet by mouth every 6 (six) hours as needed for moderate pain.   aspirin  EC 81 MG tablet Take 1 tablet (81 mg total) by mouth daily with breakfast. Swallow whole.   atorvastatin  20 MG tablet Commonly known as: LIPITOR Take 1 tablet (20 mg total) by mouth daily at 6 PM.   B-12 5000 MCG Caps Take 5,000 mcg by mouth daily.   Calmoseptine  0.44-20.6 % Oint Generic drug: Menthol -Zinc  Oxide Apply 1 Application topically daily as needed (bed sores).   clopidogrel  75 MG tablet Commonly known as: Plavix  Take 1 tablet (75 mg total) by mouth daily.   divalproex  500 MG 24 hr tablet Commonly known as: DEPAKOTE  ER Take 2 tablets (1,000 mg total) by mouth at bedtime.   furosemide  20 MG tablet Commonly known as: LASIX  Take 20 mg by mouth daily.   gabapentin  100 MG capsule Commonly known as: NEURONTIN  Take 1 capsule (100 mg total) by mouth 3 (three) times daily.   Invega Sustenna 234 MG/1.5ML injection Generic drug: paliperidone Inject into the muscle.   ipratropium-albuterol  0.5-2.5 (3) MG/3ML Soln Commonly known as: DUONEB Take 3 mLs by nebulization every 6 (six) hours as needed.   methimazole  10 MG tablet Commonly known as: TAPAZOLE  Take 1 tablet (10 mg total) by mouth daily. What changed: when to take this   multivitamin with minerals Tabs tablet Take 1 tablet by mouth daily. Centrum Silver    pantoprazole  40 MG  tablet Commonly known as: PROTONIX  Take 40 mg by mouth daily.   potassium chloride  10 MEQ tablet Commonly known as: KLOR-CON  Take 10 mEq by mouth daily.   silver  sulfADIAZINE  1 % cream Commonly known as: Silvadene  Apply 1 Application topically daily.   traZODone  100 MG tablet Commonly known as: DESYREL  Take 100 mg by mouth at bedtime.   Vitamin D3 50 MCG (2000 UT) capsule Take 2,000 Units by mouth daily.   ziprasidone  20 MG capsule Commonly known as: GEODON  Take 20 mg by mouth 2 (two) times daily.        Contact information for follow-up providers     Orpha Yancey LABOR, MD Follow up in 1 week(s).   Specialty: Internal Medicine Contact information: [address] [place] [address] [phone]         [facility]. Schedule an appointment as soon as possible for a visit in 2 week(s).   Specialty: Neurology Contact information: [address]  [address] [place]  [address] [phone]             Contact information for after-discharge care     Home Medical Care     [facility]) .   Service: Home Health Services Contact information: [phone] [place]  [address] [phone]                    Allergies[1]  Consultations:    Procedures/Studies: ECHOCARDIOGRAM COMPLETE Result Date: 12/26/2023    ECHOCARDIOGRAM REPORT   Patient Name:   NOLAN LASSER Date of Exam: 12/26/2023 Medical Rec #:  [id number]       Height:       66.0 in Accession #:    [id number]      Weight:       119.0 lb Date of Birth:  24-Sep-1955       BSA:          1.604 m Patient Age:    68 years        BP:           155/73 mmHg Patient Gender: F               HR:           79 bpm. Exam Location:  Inpatient Procedure: 2D Echo, Cardiac Doppler and Color Doppler (Both Spectral and Color            Flow Doppler were utilized during procedure). Indications:    TIA G45.9  History:        Patient has prior history of Echocardiogram examinations, most                 recent 02/19/2022. Stroke.  Sonographer:    Jayson Gaskins Referring Phys: [id number] Neveyah Garzon IMPRESSIONS  1. Left ventricular ejection fraction, by estimation, is 70 to 75%. The left ventricle has hyperdynamic function. The left ventricle has no regional wall motion abnormalities. Left ventricular diastolic parameters are consistent with Grade I diastolic dysfunction (impaired relaxation).  2. Right ventricular systolic function is normal. The right ventricular size is normal. Tricuspid regurgitation signal is inadequate for assessing PA pressure.  3. The mitral valve is normal in structure. No evidence of mitral valve regurgitation. No evidence of mitral stenosis.  4. The aortic valve was not well visualized. Aortic valve regurgitation is not visualized. No aortic stenosis is present.  5. The inferior vena cava is normal in size with  greater  than 50% respiratory variability, suggesting right atrial pressure of 3 mmHg. FINDINGS  Left Ventricle: Left ventricular ejection fraction, by estimation, is 70 to 75%. The left ventricle has hyperdynamic function. The left ventricle has no regional wall motion abnormalities. The left ventricular internal cavity size was normal in size. There is no left ventricular hypertrophy. Left ventricular diastolic parameters are consistent with Grade I diastolic dysfunction (impaired relaxation). Normal left ventricular filling pressure. Right Ventricle: The right ventricular size is normal. Right vetricular wall thickness was not well visualized. Right ventricular systolic function is normal. Tricuspid regurgitation signal is inadequate for assessing PA pressure. Left Atrium: Left atrial size was normal in size. Right Atrium: Right atrial size was normal in size. Pericardium: There is no evidence of pericardial effusion. Mitral Valve: The mitral valve is normal in structure. No evidence of mitral valve regurgitation. No evidence of mitral valve stenosis. Tricuspid Valve: The tricuspid valve is not well visualized. Tricuspid valve regurgitation is not demonstrated. No evidence of tricuspid stenosis. Aortic Valve: The aortic valve was not well visualized. Aortic valve regurgitation is not visualized. No aortic stenosis is present. Aortic valve mean gradient measures 3.0 mmHg. Aortic valve peak gradient measures 4.2 mmHg. Aortic valve area, by VTI measures 2.88 cm. Pulmonic Valve: The pulmonic valve was not well visualized. Pulmonic valve regurgitation is not visualized. No evidence of pulmonic stenosis. Aorta: The aortic root is normal in size and structure. Venous: The inferior vena cava is normal in size with greater than 50% respiratory variability, suggesting right atrial pressure of 3 mmHg. IAS/Shunts: No atrial level shunt detected by color flow Doppler.  LEFT VENTRICLE PLAX 2D LVIDd:         3.82 cm   Diastology LVIDs:          2.73 cm   LV e' medial:    4.30 cm/s LV PW:         0.67 cm   LV E/e' medial:  18.0 LV IVS:        0.72 cm   LV e' lateral:   5.42 cm/s LVOT diam:     1.90 cm   LV E/e' lateral: 14.3 LV SV:         53 LV SV Index:   33 LVOT Area:     2.84 cm  LEFT ATRIUM           Index        RIGHT ATRIUM           Index LA Vol (A2C): 30.4 ml 18.95 ml/m  RA Area:     16.70 cm LA Vol (A4C): 19.2 ml 11.97 ml/m  RA Volume:   50.10 ml  31.23 ml/m  AORTIC VALVE AV Area (Vmax):    2.65 cm AV Area (Vmean):   2.92 cm AV Area (VTI):     2.88 cm AV Vmax:           103.00 cm/s AV Vmean:          78.500 cm/s AV VTI:            0.185 m AV Peak Grad:      4.2 mmHg AV Mean Grad:      3.0 mmHg LVOT Vmax:         96.10 cm/s LVOT Vmean:        80.900 cm/s LVOT VTI:          0.188 m LVOT/AV VTI ratio: 1.02  AORTA Ao Root diam: 3.00 cm  MITRAL VALVE MV Area (PHT): 3.45 cm    SHUNTS MV Decel Time: 220 msec    Systemic VTI:  0.19 m MV E velocity: 77.60 cm/s  Systemic Diam: 1.90 cm MV A velocity: 87.80 cm/s MV E/A ratio:  0.88 Dorn Ross MD Electronically signed by Dorn Ross MD Signature Date/Time: 12/26/2023/3:43:05 PM    Final    MR BRAIN WO CONTRAST Result Date: 12/26/2023 EXAM: MRI BRAIN WITHOUT CONTRAST 12/26/2023 11:01:51 AM TECHNIQUE: Multiplanar multisequence MRI of the head/brain was performed without the administration of intravenous contrast. COMPARISON: CT head, CTA earlier today. Brain MRI 08/15/2017. CLINICAL HISTORY: 68 year old female with code stroke presentation and mental status change of unknown cause. FINDINGS: BRAIN AND VENTRICLES: No acute infarct. No acute intracranial hemorrhage. No mass. No midline shift. Chronic ventriculomegaly appears stable since 2019. Evans Index is 0.31. Callosal angle 117 degrees. No transependymal edema. No hyperdynamic flow at the cerebral aqueduct. Stable brain volume since 2019. Patchy chronic cerebral white matter T2 and FLAIR hyperintensity, more pronounced in the  right hemisphere and especially the right corona radiata, has not significantly changed. Small area of cystic appearing white matter encephalomalacia at the left temporal stem is stable. No cortical encephalomalacia identified. Possible small chronic microhemorrhage of the right external capsule (series 14, image 42). No other chronic cerebral blood products. Deep gray nuclei and brainstem appear negative. 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 The sella is unremarkable. Normal flow voids. ORBITS: No acute abnormality. SINUSES AND MASTOIDS: No acute abnormality. Grossly normal visible internal auditory structures. BONES AND SOFT TISSUES: Normal marrow signal. No acute soft tissue abnormality. IMPRESSION: 1. No acute intracranial abnormality. 2. Chronic small vessel disease appears stable since 2019. 3. Stable chronic nonspecific ventriculomegaly (Evans index 0.31 and callosal angle 117 degrees). Electronically signed by: Helayne Hurst MD 12/26/2023 11:10 AM EST RP Workstation: [id number]   CT ANGIO HEAD NECK W WO CM Result Date: 12/26/2023 EXAM: CTA HEAD AND NECK WITH _study_datetime_ TECHNIQUE: CTA of the head and neck was performed with the administration of intravenous contrast. Multiplanar 2D and/or 3D reformatted images are provided for review. Automated exposure control, iterative reconstruction, and/or weight based adjustment of the mA/kV was utilized to reduce the radiation dose to as low as reasonably achievable. Stenosis of the internal carotid arteries measured using NASCET criteria. COMPARISON: MRA head 06/10/2017 CLINICAL HISTORY: ACUTE NEURO DEFICIT, STROKE SUSPECTED ; LEFT-SIDED WEAKNESS FINDINGS: CTA NECK: AORTIC ARCH AND ARCH VESSELS: No dissection or arterial injury. No significant stenosis of the brachiocephalic or subclavian arteries. CERVICAL CAROTID ARTERIES: No dissection, arterial injury, or hemodynamically significant stenosis by  NASCET criteria. CERVICAL VERTEBRAL ARTERIES: No dissection, arterial injury, or significant stenosis. LUNGS AND MEDIASTINUM: Bilateral pulmonary emphysema. SOFT TISSUES: No acute abnormality. BONES: No acute abnormality. CTA HEAD: ANTERIOR CIRCULATION: No significant stenosis of the internal carotid arteries. No significant stenosis of the anterior cerebral arteries. No significant stenosis of the middle cerebral arteries. No aneurysm. POSTERIOR CIRCULATION: No significant stenosis of the posterior cerebral arteries. No significant stenosis of the basilar artery. No significant stenosis of the vertebral arteries. No aneurysm. OTHER: No dural venous sinus thrombosis on this non-dedicated study. Degenerative changes noted. IMPRESSION: 1. No large vessel occlusion or hemodynamically significant stenosis. 2. These findings were communicated to Dr. Zammit by phone at 10:04 AM. Electronically signed by: Prentice Spade 12/26/2023 10:36 AM EST RP Workstation: [id number]   CT HEAD CODE STROKE WO CONTRAST (LKW 0-4.5h, LVO 0-24h) Result Date: 12/26/2023  EXAM: CT HEAD WITHOUT CONTRAST 12/26/2023 09:32:09 AM TECHNIQUE: CT of the head was performed without the administration of intravenous contrast. Automated exposure control, iterative reconstruction, and/or weight based adjustment of the mA/kV was utilized to reduce the radiation dose to as low as reasonably achievable. COMPARISON: CT head 04/11/2022. CLINICAL HISTORY: Neuro deficit, acute, stroke suspected. FINDINGS: BRAIN AND VENTRICLES: No acute hemorrhage. No evidence of acute infarct. No hydrocephalus. No extra-axial collection. No mass effect or midline shift. Similar appearance of encephalomalacia in the anterior right temporal lobe. Similar chronic microvascular ischemic changes. Mild parenchymal volume loss. Atherosclerosis of the carotid siphons. [place] Stroke Program Early CT (ASPECT) score: Ganglionic (caudate, internal capsule, lentiform nucleus, insula,  M1-M3): 7 Supraganglionic (M4-M6): 3 Total: 10 ORBITS: No acute abnormality. SINUSES: No acute abnormality. SOFT TISSUES AND SKULL: No acute soft tissue abnormality. No skull fracture. IMPRESSION: 1. No acute intracranial abnormality. 2. ASPECTS score is 10. 3. Findings discussed with Dr. Zammit at 9:43 AM on 12/26/23. Electronically signed by: Donnice Mania MD 12/26/2023 09:44 AM EST RP Workstation: [id number]      Subjective:   Discharge Exam: Vitals:   12/27/23 1000 12/27/23 1200  BP: (!) 161/72 (!) 141/88  Pulse: 85 84  Resp: 20 20  Temp: 98.6 F (37 C) 98.7 F (37.1 C)  SpO2: 95% 95%    General: Pt is alert, awake, not in acute distress Cardiovascular: rate controlled, S1/S2 + Respiratory: bilateral decreased breath sounds at bases Abdominal: Soft, NT, ND, bowel sounds + Extremities: no edema, no cyanosis    The results of significant diagnostics from this hospitalization (including imaging, microbiology, ancillary and laboratory) are listed below for reference.     Microbiology: No results found for this or any previous visit (from the past 240 hours).   Labs: BNP (last 3 results) No results for input(s): BNP in the last 8760 hours. Basic Metabolic Panel: Recent Labs  Lab 12/26/23 0910  NA 139  K 4.3  CL 101  CO2 30  GLUCOSE 144*  BUN 20  CREATININE 0.75  CALCIUM  10.1   Liver Function Tests: Recent Labs  Lab 12/26/23 0910  AST 23  ALT 13  ALKPHOS 113  BILITOT 0.5  PROT 7.7  ALBUMIN 4.6   No results for input(s): LIPASE, AMYLASE in the last 168 hours. No results for input(s): AMMONIA in the last 168 hours. CBC: Recent Labs  Lab 12/26/23 0910  WBC 11.4*  NEUTROABS 9.1*  HGB 14.2  HCT 44.5  MCV 92.9  PLT 231   Cardiac Enzymes: No results for input(s): CKTOTAL, CKMB, CKMBINDEX, TROPONINI in the last 168 hours. BNP: Invalid input(s): POCBNP CBG: Recent Labs  Lab 12/26/23 1640 12/26/23 2244 12/27/23 0759  12/27/23 0831 12/27/23 1120  GLUCAP 95 82 60* 95 84   D-Dimer No results for input(s): DDIMER in the last 72 hours. Hgb A1c Recent Labs    12/26/23 1205  HGBA1C 5.3   Lipid Profile Recent Labs    12/27/23 0433  CHOL 197  HDL 94  LDLCALC 80  TRIG 114  CHOLHDL 2.1   Thyroid  function studies Recent Labs    12/26/23 1230  TSH 0.317*   Anemia work up No results for input(s): VITAMINB12, FOLATE, FERRITIN, TIBC, IRON, RETICCTPCT in the last 72 hours. Urinalysis    Component Value Date/Time   COLORURINE YELLOW 10/11/2023 1000   APPEARANCEUR CLEAR 10/11/2023 1000   LABSPEC 1.011 10/11/2023 1000   PHURINE 7.0 10/11/2023 1000   GLUCOSEU NEGATIVE 10/11/2023 1000   HGBUR  NEGATIVE 10/11/2023 1000   BILIRUBINUR NEGATIVE 10/11/2023 1000   KETONESUR NEGATIVE 10/11/2023 1000   PROTEINUR NEGATIVE 10/11/2023 1000   UROBILINOGEN 0.2 05/12/2013 0210   NITRITE NEGATIVE 10/11/2023 1000   LEUKOCYTESUR TRACE (A) 10/11/2023 1000   Sepsis Labs Recent Labs  Lab 12/26/23 0910  WBC 11.4*   Microbiology No results found for this or any previous visit (from the past 240 hours).   Time coordinating discharge: 35 minutes  SIGNED:   Shamra Bradeen, MD  [facility] 12/27/2023, 2:49 PM      [1] No Known Allergies

## 2023-12-27 NOTE — TOC Transition Note (Signed)
 Transition of Care [facility]) - Discharge Note   Patient Details  Name: Cassidy Smith MRN: [id number] Date of Birth: 03-30-1955  Transition of [facility]) CM/SW Contact:  Noreen KATHEE Cleotilde ISRAEL Phone Number: 12/27/2023, 12:04 PM   Clinical Narrative:     CSW spoke with patient and and brother Cassidy Smith at bed side. Patient lives with Garrel , her other brother and is fairly independent at home. Pt declined SNF and wanted [facility]N [facility]. [facility] and [facility] was updated. Cassidy Smith did share that patient has an aide through shipment from 7am-1pm Monday - Sunday. ICM signing off.   Final next level of care: Home w Home Health Services (has an aide through [facility] : 7AM-1PM 7 days a week)     Patient Goals and CMS Choice Patient states their goals for this hospitalization and ongoing recovery are:: return home CMS Medicare.gov Compare Post Acute Care list provided to:: Patient Represenative (must comment) (steven)        Discharge Placement                Patient to be transferred to facility by: Family Name of family member notified: Cassidy Smith Patient and family notified of of transfer: 12/27/23  Discharge Plan and Services Additional resources added to the After Visit Summary for                            [facility] Arranged: PT, RN [facility] Agency: [facility] Date HH Agency Contacted: 12/27/23 Time HH Agency Contacted: 1156 Representative spoke with at [facility] Agency: [facility]  Social Drivers of Health (SDOH) Interventions SDOH Screenings   Food Insecurity: No Food Insecurity (12/26/2023)  Housing: Low Risk (12/26/2023)  Transportation Needs: No Transportation Needs (12/26/2023)  Utilities: Not At Risk (12/26/2023)  Financial Resource Strain: Low Risk (08/19/2022)  Physical Activity: Inactive (10/04/2022)  Social Connections: Unknown (12/26/2023)  Tobacco Use: Medium Risk (12/26/2023)  Health Literacy: Inadequate Health Literacy  (11/04/2022)     Readmission Risk Interventions    08/30/2022    3:30 PM  Readmission Risk Prevention Plan  Transportation Screening Complete  PCP or Specialist Appt within 3-5 Days Complete  HRI or Home Care Consult Complete

## 2023-12-27 NOTE — Progress Notes (Signed)
 Holding meds since patient is NPO. [facility] swallow screen attempted again. Patient failed d/t stopping while drinking and then coughing.

## 2023-12-27 NOTE — Plan of Care (Signed)
   Problem: Education: Goal: Knowledge of disease or condition will improve Outcome: Progressing Goal: Knowledge of secondary prevention will improve (MUST DOCUMENT ALL) Outcome: Progressing Goal: Knowledge of patient specific risk factors will improve (DELETE if not current risk factor) Outcome: Progressing   Problem: Ischemic Stroke/TIA Tissue Perfusion: Goal: Complications of ischemic stroke/TIA will be minimized Outcome: Progressing   Problem: Coping: Goal: Will verbalize positive feelings about self Outcome: Progressing Goal: Will identify appropriate support needs Outcome: Progressing   Problem: Health Behavior/Discharge Planning: Goal: Ability to manage health-related needs will improve Outcome: Progressing Goal: Goals will be collaboratively established with patient/family Outcome: Progressing   Problem: Self-Care: Goal: Ability to participate in self-care as condition permits will improve Outcome: Progressing Goal: Verbalization of feelings and concerns over difficulty with self-care will improve Outcome: Progressing Goal: Ability to communicate needs accurately will improve Outcome: Progressing   Problem: Nutrition: Goal: Risk of aspiration will decrease Outcome: Progressing Goal: Dietary intake will improve Outcome: Progressing   Problem: Education: Goal: Knowledge of General Education information will improve Description: Including pain rating scale, medication(s)/side effects and non-pharmacologic comfort measures Outcome: Progressing   Problem: Health Behavior/Discharge Planning: Goal: Ability to manage health-related needs will improve Outcome: Progressing

## 2023-12-27 NOTE — Evaluation (Signed)
 Physical Therapy Evaluation Patient Details Name: Cassidy Smith MRN: [id number] DOB: 05/23/1955 Today's Date: 12/27/2023  History of Present Illness  Cassidy Smith is a 68 y.o. female with medical history significant of ICH 2021, CVA, SIADH, hypothyroidism who was brought by EMS due to concern for left-sided weakness and slurred speech.  History obtained from ER information as well as from her brother over the phone.  He said that the patient was apparently okay last night but sometime in the morning her aide found her altered.  She was not speaking normally.  Also she was having some left-sided weakness.   Clinical Impression  Patient demonstrates decreased cognition, abnormal vision/depth perception, decreased LE and LUE strength, abnormal posture, and impaired balance. Patient also demonstrates modified independence with bed mobility with verba cues for safety. Pt demonstrates need for min/mod assist with transfers and ambulation with RW, pt requires help navigating and is only able to follow simple one step commands. Increased difficulty with ambulation during today's session with decreased stride length and velocity noted. Patient also demonstrates significant decreased in LUE as compared to the right but remains functional with use of RW with LUE. Patient requires redirection throughout session today and is also still demonstrating difficulty with speech. Patient would benefit from skilled physical therapy for increased endurance with ambulation, increased LE/UE strength, and balance for improved functional mobility, return to higher level of function with ADLs, and progress towards therapy goals.         If plan is discharge home, recommend the following: A lot of help with walking and/or transfers;A lot of help with bathing/dressing/bathroom;Assistance with cooking/housework;Direct supervision/assist for medications management;Direct supervision/assist for financial management;Help with  stairs or ramp for entrance;Supervision due to cognitive status   Can travel by private vehicle        Equipment Recommendations None recommended by PT  Recommendations for Other Services       Functional Status Assessment Patient has had a recent decline in their functional status and demonstrates the ability to make significant improvements in function in a reasonable and predictable amount of time.     Precautions / Restrictions Precautions Precautions: Fall Recall of Precautions/Restrictions: Impaired Restrictions Weight Bearing Restrictions Per Provider Order: No      Mobility  Bed Mobility Overal bed mobility: Modified Independent             General bed mobility comments: increased time and cueing for safety    Transfers Overall transfer level: Needs assistance Equipment used: Rolling walker (2 wheels) Transfers: Sit to/from Stand, Bed to chair/wheelchair/BSC Sit to Stand: Min assist, Mod assist           General transfer comment: pt requires constant safety cueing and redirection    Ambulation/Gait Ambulation/Gait assistance: Mod assist Gait Distance (Feet): 15 Feet Assistive device: Rolling walker (2 wheels) Gait Pattern/deviations: Decreased step length - right, Decreased step length - left, Decreased stride length, Shuffle, Staggering left, Trunk flexed Gait velocity: decreased     General Gait Details: labored movement, ER of hip and toe out extreme, pt impulsive and requires assist to move walker  Stairs            Wheelchair Mobility     Tilt Bed    Modified Rankin (Stroke Patients Only)       Balance Overall balance assessment: Needs assistance Sitting-balance support: Single extremity supported Sitting balance-Leahy Scale: Fair     Standing balance support: Single extremity supported Standing balance-Leahy Scale: Fair  Pertinent Vitals/Pain Pain Assessment Pain Assessment:  0-10 Pain Score: 0-No pain    Home Living Family/patient expects to be discharged to:: Private residence Living Arrangements: Other relatives Available Help at Discharge: Other (Comment) (unable to assess, pt unable to give clear subjective) Type of Home: House Home Access: Stairs to enter Entrance Stairs-Rails: Right Entrance Stairs-Number of Steps: 1 Alternate Level Stairs-Number of Steps: 2 flights Home Layout: Two level (pt unable to state if she can stay on one level) Home Equipment: Cane - single point;Rolling Walker (2 wheels);Shower seat Additional Comments: poor historian, no family present    Prior Function Prior Level of Function : Needs assist;Patient poor historian/Family not available             Mobility Comments: pt states she still is using furniture to get around house, and uses cane and walker sometimes ADLs Comments: unable to obtain amount of assist needed     Extremity/Trunk Assessment   Upper Extremity Assessment Upper Extremity Assessment: LUE deficits/detail LUE Deficits / Details: decreased coordination and strength of LUE although mobile    Lower Extremity Assessment Lower Extremity Assessment: Generalized weakness    Cervical / Trunk Assessment Cervical / Trunk Assessment: Kyphotic  Communication   Communication Communication: Impaired Factors Affecting Communication: Reduced clarity of speech    Cognition Arousal: Stuporous Behavior During Therapy: Impulsive   PT - Cognitive impairments: No family/caregiver present to determine baseline, Attention, Sequencing, Safety/Judgement                         Following commands: Impaired Following commands impaired: Only follows one step commands consistently     Cueing Cueing Techniques: Verbal cues, Tactile cues     General Comments      Exercises     Assessment/Plan    PT Assessment Patient needs continued PT services  PT Problem List Decreased strength;Decreased  balance;Decreased cognition;Decreased knowledge of precautions;Decreased mobility;Decreased knowledge of use of DME;Decreased activity tolerance;Decreased safety awareness;Decreased coordination       PT Treatment Interventions DME instruction;Gait training;Stair training;Functional mobility training;Neuromuscular re-education;Balance training;Patient/family education;Therapeutic exercise;Therapeutic activities    PT Goals (Current goals can be found in the Care Plan section)  Acute Rehab PT Goals Patient Stated Goal: to go back home PT Goal Formulation: With patient Time For Goal Achievement: 01/10/24 Potential to Achieve Goals: Fair    Frequency Min 2X/week     Co-evaluation               AM-PAC PT 6 Clicks Mobility  Outcome Measure Help needed turning from your back to your side while in a flat bed without using bedrails?: A Little Help needed moving from lying on your back to sitting on the side of a flat bed without using bedrails?: A Little Help needed moving to and from a bed to a chair (including a wheelchair)?: A Lot Help needed standing up from a chair using your arms (e.g., wheelchair or bedside chair)?: A Little Help needed to walk in hospital room?: A Lot Help needed climbing 3-5 steps with a railing? : A Lot 6 Click Score: 15    End of Session Equipment Utilized During Treatment: Gait belt Activity Tolerance: Patient tolerated treatment well;Patient limited by fatigue Patient left: in bed;with bed alarm set;with call bell/phone within reach Nurse Communication: Mobility status;Precautions PT Visit Diagnosis: Unsteadiness on feet (R26.81);Other abnormalities of gait and mobility (R26.89);Muscle weakness (generalized) (M62.81);Difficulty in walking, not elsewhere classified (R26.2)    Time: 9192-9155 PT  Time Calculation (min) (ACUTE ONLY): 37 min   Charges:   PT Evaluation $PT Eval Moderate Complexity: 1 Mod PT Treatments $Therapeutic Activity: 8-22  mins PT General Charges $$ ACUTE PT VISIT: 1 Visit         Lang Ada, PT, DPT [facility] Office: [phone] 9:12 AM, 12/27/2023

## 2023-12-27 NOTE — Plan of Care (Signed)
°  Problem: Self-Care: Goal: Ability to participate in self-care as condition permits will improve Outcome: Progressing Goal: Verbalization of feelings and concerns over difficulty with self-care will improve Outcome: Progressing Goal: Ability to communicate needs accurately will improve Outcome: Progressing   Problem: Nutrition: Goal: Risk of aspiration will decrease Outcome: Progressing Goal: Dietary intake will improve Outcome: Progressing   Problem: Education: Goal: Knowledge of General Education information will improve Description: Including pain rating scale, medication(s)/side effects and non-pharmacologic comfort measures Outcome: Progressing   Problem: Health Behavior/Discharge Planning: Goal: Ability to manage health-related needs will improve Outcome: Progressing
